# Patient Record
Sex: Female | Born: 1950 | Race: Black or African American | Hispanic: No | Smoking: Never smoker
Health system: Southern US, Community
[De-identification: ages and names within clinical notes are randomized; demographics above are authoritative.]

## PROBLEM LIST (undated history)

## (undated) ENCOUNTER — Emergency Department (HOSPITAL_COMMUNITY): Admission: EM | Payer: PPO | Source: Home / Self Care

## (undated) DIAGNOSIS — I639 Cerebral infarction, unspecified: Secondary | ICD-10-CM

## (undated) DIAGNOSIS — Z860100 Personal history of colon polyps, unspecified: Secondary | ICD-10-CM

## (undated) DIAGNOSIS — K219 Gastro-esophageal reflux disease without esophagitis: Secondary | ICD-10-CM

## (undated) DIAGNOSIS — T4145XA Adverse effect of unspecified anesthetic, initial encounter: Secondary | ICD-10-CM

## (undated) DIAGNOSIS — T7840XA Allergy, unspecified, initial encounter: Secondary | ICD-10-CM

## (undated) DIAGNOSIS — E669 Obesity, unspecified: Secondary | ICD-10-CM

## (undated) DIAGNOSIS — R55 Syncope and collapse: Secondary | ICD-10-CM

## (undated) DIAGNOSIS — F329 Major depressive disorder, single episode, unspecified: Secondary | ICD-10-CM

## (undated) DIAGNOSIS — F32A Depression, unspecified: Secondary | ICD-10-CM

## (undated) DIAGNOSIS — E2609 Other primary hyperaldosteronism: Secondary | ICD-10-CM

## (undated) DIAGNOSIS — I82409 Acute embolism and thrombosis of unspecified deep veins of unspecified lower extremity: Secondary | ICD-10-CM

## (undated) DIAGNOSIS — I10 Essential (primary) hypertension: Secondary | ICD-10-CM

## (undated) DIAGNOSIS — M47812 Spondylosis without myelopathy or radiculopathy, cervical region: Secondary | ICD-10-CM

## (undated) HISTORY — DX: Acute embolism and thrombosis of unspecified deep veins of unspecified lower extremity: I82.409

## (undated) HISTORY — DX: Gastro-esophageal reflux disease without esophagitis: K21.9

## (undated) HISTORY — DX: Obesity, unspecified: E66.9

## (undated) HISTORY — DX: Spondylosis without myelopathy or radiculopathy, cervical region: M47.812

## (undated) HISTORY — DX: Major depressive disorder, single episode, unspecified: F32.9

## (undated) HISTORY — DX: Personal history of colonic polyps: Z86.010

## (undated) HISTORY — DX: Allergy, unspecified, initial encounter: T78.40XA

## (undated) HISTORY — DX: Essential (primary) hypertension: I10

## (undated) HISTORY — DX: Depression, unspecified: F32.A

## (undated) HISTORY — PX: COLONOSCOPY: SHX174

## (undated) HISTORY — DX: Other primary hyperaldosteronism: E26.09

## (undated) HISTORY — DX: Cerebral infarction, unspecified: I63.9

## (undated) HISTORY — DX: Personal history of colon polyps, unspecified: Z86.0100

## (undated) HISTORY — PX: TUBAL LIGATION: SHX77

---

## 1996-12-04 HISTORY — PX: FOOT SURGERY: SHX648

## 2000-02-09 ENCOUNTER — Other Ambulatory Visit: Admission: RE | Admit: 2000-02-09 | Discharge: 2000-02-09 | Payer: Self-pay | Admitting: Obstetrics and Gynecology

## 2000-07-19 ENCOUNTER — Ambulatory Visit (HOSPITAL_COMMUNITY): Admission: RE | Admit: 2000-07-19 | Discharge: 2000-07-19 | Payer: Self-pay | Admitting: Gastroenterology

## 2000-08-11 ENCOUNTER — Encounter: Payer: Self-pay | Admitting: Emergency Medicine

## 2000-08-11 ENCOUNTER — Emergency Department (HOSPITAL_COMMUNITY): Admission: EM | Admit: 2000-08-11 | Discharge: 2000-08-11 | Payer: Self-pay | Admitting: Emergency Medicine

## 2001-07-08 ENCOUNTER — Other Ambulatory Visit: Admission: RE | Admit: 2001-07-08 | Discharge: 2001-07-08 | Payer: Self-pay | Admitting: Obstetrics and Gynecology

## 2001-09-06 ENCOUNTER — Inpatient Hospital Stay (HOSPITAL_COMMUNITY): Admission: EM | Admit: 2001-09-06 | Discharge: 2001-09-08 | Payer: Self-pay

## 2005-05-04 ENCOUNTER — Other Ambulatory Visit: Admission: RE | Admit: 2005-05-04 | Discharge: 2005-05-04 | Payer: Self-pay | Admitting: Obstetrics and Gynecology

## 2005-07-20 ENCOUNTER — Encounter: Admission: RE | Admit: 2005-07-20 | Discharge: 2005-07-20 | Payer: Self-pay | Admitting: Family Medicine

## 2005-10-24 ENCOUNTER — Ambulatory Visit: Payer: Self-pay | Admitting: Oncology

## 2006-03-20 ENCOUNTER — Emergency Department (HOSPITAL_COMMUNITY): Admission: EM | Admit: 2006-03-20 | Discharge: 2006-03-20 | Payer: Self-pay | Admitting: Emergency Medicine

## 2006-04-05 ENCOUNTER — Ambulatory Visit: Payer: Self-pay | Admitting: Internal Medicine

## 2006-04-06 ENCOUNTER — Ambulatory Visit: Payer: Self-pay | Admitting: Cardiology

## 2006-04-24 ENCOUNTER — Ambulatory Visit: Payer: Self-pay | Admitting: Internal Medicine

## 2006-05-08 ENCOUNTER — Ambulatory Visit: Payer: Self-pay | Admitting: Pulmonary Disease

## 2006-05-09 ENCOUNTER — Emergency Department (HOSPITAL_COMMUNITY): Admission: EM | Admit: 2006-05-09 | Discharge: 2006-05-09 | Payer: Self-pay | Admitting: Emergency Medicine

## 2006-05-22 ENCOUNTER — Ambulatory Visit: Payer: Self-pay | Admitting: Internal Medicine

## 2006-05-29 ENCOUNTER — Ambulatory Visit: Payer: Self-pay | Admitting: Internal Medicine

## 2006-06-05 ENCOUNTER — Ambulatory Visit: Payer: Self-pay | Admitting: Internal Medicine

## 2006-06-20 ENCOUNTER — Ambulatory Visit: Payer: Self-pay | Admitting: Internal Medicine

## 2006-06-22 ENCOUNTER — Ambulatory Visit: Payer: Self-pay | Admitting: Internal Medicine

## 2006-07-04 HISTORY — PX: NASAL SINUS SURGERY: SHX719

## 2006-07-04 HISTORY — PX: NASAL POLYP SURGERY: SHX186

## 2006-07-05 ENCOUNTER — Emergency Department (HOSPITAL_COMMUNITY): Admission: EM | Admit: 2006-07-05 | Discharge: 2006-07-05 | Payer: Self-pay | Admitting: Emergency Medicine

## 2006-07-06 ENCOUNTER — Encounter: Payer: Self-pay | Admitting: Vascular Surgery

## 2006-07-06 ENCOUNTER — Ambulatory Visit (HOSPITAL_COMMUNITY): Admission: RE | Admit: 2006-07-06 | Discharge: 2006-07-06 | Payer: Self-pay | Admitting: Family Medicine

## 2006-07-20 ENCOUNTER — Ambulatory Visit (HOSPITAL_COMMUNITY): Admission: RE | Admit: 2006-07-20 | Discharge: 2006-07-21 | Payer: Self-pay | Admitting: Otolaryngology

## 2006-07-20 ENCOUNTER — Encounter (INDEPENDENT_AMBULATORY_CARE_PROVIDER_SITE_OTHER): Payer: Self-pay | Admitting: *Deleted

## 2006-07-31 ENCOUNTER — Ambulatory Visit: Payer: Self-pay | Admitting: Internal Medicine

## 2006-08-14 ENCOUNTER — Ambulatory Visit: Payer: Self-pay | Admitting: Internal Medicine

## 2006-09-07 ENCOUNTER — Ambulatory Visit: Payer: Self-pay | Admitting: Internal Medicine

## 2006-09-17 ENCOUNTER — Ambulatory Visit: Payer: Self-pay | Admitting: Internal Medicine

## 2006-09-27 ENCOUNTER — Ambulatory Visit: Payer: Self-pay | Admitting: Pulmonary Disease

## 2006-10-10 ENCOUNTER — Ambulatory Visit: Payer: Self-pay | Admitting: Internal Medicine

## 2006-11-02 ENCOUNTER — Ambulatory Visit: Payer: Self-pay | Admitting: Internal Medicine

## 2007-04-03 ENCOUNTER — Ambulatory Visit: Payer: Self-pay | Admitting: Pulmonary Disease

## 2007-05-31 ENCOUNTER — Ambulatory Visit: Payer: Self-pay | Admitting: Internal Medicine

## 2007-08-08 ENCOUNTER — Ambulatory Visit: Payer: Self-pay | Admitting: Internal Medicine

## 2007-08-09 ENCOUNTER — Ambulatory Visit: Payer: Self-pay | Admitting: Internal Medicine

## 2007-08-09 LAB — CONVERTED CEMR LAB
BUN: 9 mg/dL (ref 6–23)
Bacteria, UA: NEGATIVE
Basophils Absolute: 0 10*3/uL (ref 0.0–0.1)
Bilirubin Urine: NEGATIVE
CO2: 33 meq/L — ABNORMAL HIGH (ref 19–32)
Chloride: 100 meq/L (ref 96–112)
Creatinine, Ser: 0.6 mg/dL (ref 0.4–1.2)
Crystals: NEGATIVE
GFR calc Af Amer: 134 mL/min
Glucose, Bld: 99 mg/dL (ref 70–99)
HCT: 39.7 % (ref 36.0–46.0)
HDL: 49.7 mg/dL (ref >39.0)
Hemoglobin, Urine: NEGATIVE
Hemoglobin: 13.4 g/dL (ref 12.0–15.0)
Ketones, ur: NEGATIVE mg/dL
Lymphocytes Relative: 33.9 % (ref 12.0–46.0)
MCHC: 33.6 g/dL (ref 30.0–36.0)
Monocytes Absolute: 0.4 10*3/uL (ref 0.2–0.7)
Neutro Abs: 2.7 10*3/uL (ref 1.4–7.7)
Nitrite: NEGATIVE
RBC: 4.48 M/uL (ref 3.87–5.11)
Sodium: 140 meq/L (ref 135–145)
Total CHOL/HDL Ratio: 2.1
Urine Glucose: NEGATIVE mg/dL
VLDL: 11 mg/dL (ref 0–40)
WBC: 5.6 10*3/uL (ref 4.5–10.5)
pH: 8 (ref 5.0–8.0)

## 2007-08-23 ENCOUNTER — Ambulatory Visit: Payer: Self-pay | Admitting: Internal Medicine

## 2007-08-23 LAB — CONVERTED CEMR LAB: Creatinine, Ser: 0.7 mg/dL (ref 0.4–1.2)

## 2007-10-10 ENCOUNTER — Telehealth: Payer: Self-pay | Admitting: Internal Medicine

## 2007-11-07 ENCOUNTER — Ambulatory Visit: Payer: Self-pay | Admitting: Pulmonary Disease

## 2007-12-12 DIAGNOSIS — F329 Major depressive disorder, single episode, unspecified: Secondary | ICD-10-CM

## 2007-12-12 DIAGNOSIS — I10 Essential (primary) hypertension: Secondary | ICD-10-CM | POA: Insufficient documentation

## 2007-12-12 DIAGNOSIS — E669 Obesity, unspecified: Secondary | ICD-10-CM

## 2007-12-12 DIAGNOSIS — K219 Gastro-esophageal reflux disease without esophagitis: Secondary | ICD-10-CM

## 2007-12-12 DIAGNOSIS — D126 Benign neoplasm of colon, unspecified: Secondary | ICD-10-CM

## 2007-12-12 DIAGNOSIS — F32A Depression, unspecified: Secondary | ICD-10-CM | POA: Insufficient documentation

## 2007-12-12 DIAGNOSIS — J328 Other chronic sinusitis: Secondary | ICD-10-CM

## 2007-12-12 DIAGNOSIS — M129 Arthropathy, unspecified: Secondary | ICD-10-CM | POA: Insufficient documentation

## 2008-01-01 ENCOUNTER — Ambulatory Visit: Payer: Self-pay | Admitting: Internal Medicine

## 2008-01-01 DIAGNOSIS — S335XXA Sprain of ligaments of lumbar spine, initial encounter: Secondary | ICD-10-CM

## 2008-01-01 DIAGNOSIS — J069 Acute upper respiratory infection, unspecified: Secondary | ICD-10-CM | POA: Insufficient documentation

## 2008-01-21 ENCOUNTER — Encounter: Payer: Self-pay | Admitting: Internal Medicine

## 2008-01-21 ENCOUNTER — Ambulatory Visit: Payer: Self-pay | Admitting: Internal Medicine

## 2008-01-21 DIAGNOSIS — R05 Cough: Secondary | ICD-10-CM

## 2008-01-30 ENCOUNTER — Telehealth: Payer: Self-pay | Admitting: Internal Medicine

## 2008-02-12 ENCOUNTER — Telehealth: Payer: Self-pay | Admitting: Internal Medicine

## 2008-03-10 ENCOUNTER — Encounter: Payer: Self-pay | Admitting: Internal Medicine

## 2008-03-18 ENCOUNTER — Telehealth: Payer: Self-pay | Admitting: Internal Medicine

## 2008-04-07 ENCOUNTER — Encounter: Payer: Self-pay | Admitting: Internal Medicine

## 2008-04-10 ENCOUNTER — Encounter: Payer: Self-pay | Admitting: Internal Medicine

## 2008-05-06 ENCOUNTER — Ambulatory Visit: Payer: Self-pay | Admitting: Internal Medicine

## 2008-05-07 ENCOUNTER — Telehealth (INDEPENDENT_AMBULATORY_CARE_PROVIDER_SITE_OTHER): Payer: Self-pay | Admitting: *Deleted

## 2008-05-27 ENCOUNTER — Ambulatory Visit: Payer: Self-pay | Admitting: Internal Medicine

## 2008-05-27 DIAGNOSIS — B370 Candidal stomatitis: Secondary | ICD-10-CM

## 2008-05-28 ENCOUNTER — Telehealth: Payer: Self-pay | Admitting: Adult Health

## 2008-06-03 ENCOUNTER — Encounter: Payer: Self-pay | Admitting: Adult Health

## 2008-06-30 ENCOUNTER — Ambulatory Visit: Payer: Self-pay | Admitting: Internal Medicine

## 2008-06-30 DIAGNOSIS — J019 Acute sinusitis, unspecified: Secondary | ICD-10-CM

## 2008-06-30 DIAGNOSIS — M79609 Pain in unspecified limb: Secondary | ICD-10-CM | POA: Insufficient documentation

## 2008-06-30 DIAGNOSIS — Z860100 Personal history of colon polyps, unspecified: Secondary | ICD-10-CM | POA: Insufficient documentation

## 2008-07-07 ENCOUNTER — Encounter: Payer: Self-pay | Admitting: Internal Medicine

## 2008-07-07 ENCOUNTER — Ambulatory Visit: Payer: Self-pay

## 2008-07-30 ENCOUNTER — Ambulatory Visit: Payer: Self-pay | Admitting: Internal Medicine

## 2008-08-15 ENCOUNTER — Telehealth: Payer: Self-pay | Admitting: Internal Medicine

## 2008-08-16 ENCOUNTER — Emergency Department (HOSPITAL_COMMUNITY): Admission: EM | Admit: 2008-08-16 | Discharge: 2008-08-17 | Payer: Self-pay | Admitting: Emergency Medicine

## 2008-08-18 ENCOUNTER — Telehealth (INDEPENDENT_AMBULATORY_CARE_PROVIDER_SITE_OTHER): Payer: Self-pay | Admitting: *Deleted

## 2008-08-19 ENCOUNTER — Ambulatory Visit: Payer: Self-pay | Admitting: Internal Medicine

## 2008-09-02 ENCOUNTER — Ambulatory Visit: Payer: Self-pay | Admitting: Cardiology

## 2008-10-12 ENCOUNTER — Telehealth: Payer: Self-pay | Admitting: Internal Medicine

## 2008-10-19 ENCOUNTER — Ambulatory Visit: Payer: Self-pay | Admitting: Internal Medicine

## 2008-10-21 ENCOUNTER — Telehealth: Payer: Self-pay | Admitting: Internal Medicine

## 2008-10-27 ENCOUNTER — Encounter: Payer: Self-pay | Admitting: Internal Medicine

## 2008-11-23 ENCOUNTER — Ambulatory Visit: Payer: Self-pay | Admitting: Internal Medicine

## 2008-11-23 DIAGNOSIS — J4530 Mild persistent asthma, uncomplicated: Secondary | ICD-10-CM | POA: Insufficient documentation

## 2008-12-17 ENCOUNTER — Ambulatory Visit: Payer: Self-pay | Admitting: Adult Health

## 2009-01-07 ENCOUNTER — Ambulatory Visit: Payer: Self-pay | Admitting: Internal Medicine

## 2009-01-13 ENCOUNTER — Telehealth: Payer: Self-pay | Admitting: Internal Medicine

## 2009-01-13 ENCOUNTER — Telehealth (INDEPENDENT_AMBULATORY_CARE_PROVIDER_SITE_OTHER): Payer: Self-pay | Admitting: *Deleted

## 2009-01-22 ENCOUNTER — Telehealth: Payer: Self-pay | Admitting: Internal Medicine

## 2009-04-02 ENCOUNTER — Ambulatory Visit (HOSPITAL_COMMUNITY): Admission: RE | Admit: 2009-04-02 | Discharge: 2009-04-02 | Payer: Self-pay | Admitting: Otolaryngology

## 2009-04-02 ENCOUNTER — Encounter (INDEPENDENT_AMBULATORY_CARE_PROVIDER_SITE_OTHER): Payer: Self-pay | Admitting: Otolaryngology

## 2009-04-09 ENCOUNTER — Encounter: Payer: Self-pay | Admitting: Internal Medicine

## 2009-04-15 ENCOUNTER — Encounter: Payer: Self-pay | Admitting: Internal Medicine

## 2009-04-16 ENCOUNTER — Ambulatory Visit: Payer: Self-pay | Admitting: Internal Medicine

## 2009-04-16 DIAGNOSIS — E876 Hypokalemia: Secondary | ICD-10-CM | POA: Insufficient documentation

## 2009-04-23 ENCOUNTER — Ambulatory Visit: Payer: Self-pay | Admitting: Internal Medicine

## 2009-04-27 LAB — CONVERTED CEMR LAB
CO2: 32 meq/L (ref 19–32)
Calcium: 8.7 mg/dL (ref 8.4–10.5)
Creatinine, Ser: 0.7 mg/dL (ref 0.4–1.2)
Glucose, Bld: 101 mg/dL — ABNORMAL HIGH (ref 70–99)
Potassium: 3.1 meq/L — ABNORMAL LOW (ref 3.5–5.1)

## 2009-05-11 ENCOUNTER — Telehealth: Payer: Self-pay | Admitting: Internal Medicine

## 2009-05-27 ENCOUNTER — Telehealth: Payer: Self-pay | Admitting: Internal Medicine

## 2009-05-28 ENCOUNTER — Ambulatory Visit: Payer: Self-pay | Admitting: Internal Medicine

## 2009-05-28 DIAGNOSIS — R609 Edema, unspecified: Secondary | ICD-10-CM

## 2009-05-28 DIAGNOSIS — R079 Chest pain, unspecified: Secondary | ICD-10-CM

## 2009-05-29 DIAGNOSIS — J3089 Other allergic rhinitis: Secondary | ICD-10-CM

## 2009-05-29 DIAGNOSIS — J302 Other seasonal allergic rhinitis: Secondary | ICD-10-CM

## 2009-07-07 ENCOUNTER — Ambulatory Visit: Payer: Self-pay | Admitting: Internal Medicine

## 2009-07-07 ENCOUNTER — Telehealth: Payer: Self-pay | Admitting: Internal Medicine

## 2009-07-08 LAB — CONVERTED CEMR LAB
ALT: 15 units/L (ref 0–35)
BUN: 10 mg/dL (ref 6–23)
Calcium: 8.7 mg/dL (ref 8.4–10.5)
Eosinophils Absolute: 0.8 10*3/uL — ABNORMAL HIGH (ref 0.0–0.7)
Eosinophils Relative: 14.2 % — ABNORMAL HIGH (ref 0.0–5.0)
HCT: 35.9 % — ABNORMAL LOW (ref 36.0–46.0)
Ketones, ur: NEGATIVE mg/dL
Lymphocytes Relative: 31.8 % (ref 12.0–46.0)
Lymphs Abs: 1.9 10*3/uL (ref 0.7–4.0)
MCHC: 33.5 g/dL (ref 30.0–36.0)
MCV: 89.3 fL (ref 78.0–100.0)
Neutro Abs: 2.8 10*3/uL (ref 1.4–7.7)
Neutrophils Relative %: 46.7 % (ref 43.0–77.0)
Platelets: 212 10*3/uL (ref 150.0–400.0)
Potassium: 2.8 meq/L — CL (ref 3.5–5.1)
RDW: 12.5 % (ref 11.5–14.6)
Total Bilirubin: 1 mg/dL (ref 0.3–1.2)
Total CHOL/HDL Ratio: 2
Total Protein: 7.7 g/dL (ref 6.0–8.3)
Triglycerides: 58 mg/dL (ref 0.0–149.0)
Urine Glucose: NEGATIVE mg/dL
Urobilinogen, UA: 2 (ref 0.0–1.0)
VLDL: 11.6 mg/dL (ref 0.0–40.0)
WBC: 5.9 10*3/uL (ref 4.5–10.5)

## 2009-07-15 ENCOUNTER — Ambulatory Visit: Payer: Self-pay | Admitting: Internal Medicine

## 2009-07-15 ENCOUNTER — Telehealth: Payer: Self-pay | Admitting: Internal Medicine

## 2009-07-15 LAB — CONVERTED CEMR LAB
BUN: 11 mg/dL (ref 6–23)
CO2: 36 meq/L — ABNORMAL HIGH (ref 19–32)
Calcium: 9.3 mg/dL (ref 8.4–10.5)
Chloride: 101 meq/L (ref 96–112)
Creatinine, Ser: 0.8 mg/dL (ref 0.4–1.2)
Glucose, Bld: 87 mg/dL (ref 70–99)
Sodium: 141 meq/L (ref 135–145)

## 2009-08-11 ENCOUNTER — Ambulatory Visit: Payer: Self-pay | Admitting: Internal Medicine

## 2009-08-11 DIAGNOSIS — M25519 Pain in unspecified shoulder: Secondary | ICD-10-CM

## 2009-08-11 DIAGNOSIS — J9801 Acute bronchospasm: Secondary | ICD-10-CM | POA: Insufficient documentation

## 2009-08-18 ENCOUNTER — Telehealth: Payer: Self-pay | Admitting: Internal Medicine

## 2009-09-14 ENCOUNTER — Emergency Department (HOSPITAL_COMMUNITY): Admission: EM | Admit: 2009-09-14 | Discharge: 2009-09-14 | Payer: Self-pay | Admitting: Emergency Medicine

## 2009-09-16 ENCOUNTER — Ambulatory Visit: Payer: Self-pay | Admitting: Internal Medicine

## 2009-09-16 DIAGNOSIS — R930 Abnormal findings on diagnostic imaging of skull and head, not elsewhere classified: Secondary | ICD-10-CM

## 2009-09-17 ENCOUNTER — Telehealth: Payer: Self-pay | Admitting: Internal Medicine

## 2009-09-17 ENCOUNTER — Telehealth (INDEPENDENT_AMBULATORY_CARE_PROVIDER_SITE_OTHER): Payer: Self-pay | Admitting: *Deleted

## 2009-09-22 ENCOUNTER — Telehealth: Payer: Self-pay | Admitting: Internal Medicine

## 2009-09-22 ENCOUNTER — Ambulatory Visit: Payer: Self-pay | Admitting: Internal Medicine

## 2009-10-12 ENCOUNTER — Telehealth: Payer: Self-pay | Admitting: Internal Medicine

## 2009-10-20 ENCOUNTER — Encounter: Payer: Self-pay | Admitting: Internal Medicine

## 2009-10-22 ENCOUNTER — Encounter: Payer: Self-pay | Admitting: Internal Medicine

## 2009-10-29 ENCOUNTER — Ambulatory Visit: Payer: Self-pay | Admitting: Emergency Medicine

## 2009-11-08 ENCOUNTER — Ambulatory Visit: Payer: Self-pay | Admitting: Internal Medicine

## 2009-11-08 ENCOUNTER — Telehealth (INDEPENDENT_AMBULATORY_CARE_PROVIDER_SITE_OTHER): Payer: Self-pay | Admitting: *Deleted

## 2009-11-16 ENCOUNTER — Ambulatory Visit: Payer: Self-pay | Admitting: Internal Medicine

## 2009-11-16 DIAGNOSIS — R062 Wheezing: Secondary | ICD-10-CM

## 2009-11-30 ENCOUNTER — Encounter (INDEPENDENT_AMBULATORY_CARE_PROVIDER_SITE_OTHER): Payer: Self-pay | Admitting: *Deleted

## 2009-12-02 ENCOUNTER — Ambulatory Visit: Payer: Self-pay | Admitting: Internal Medicine

## 2009-12-02 ENCOUNTER — Encounter: Payer: Self-pay | Admitting: Internal Medicine

## 2009-12-02 ENCOUNTER — Telehealth (INDEPENDENT_AMBULATORY_CARE_PROVIDER_SITE_OTHER): Payer: Self-pay | Admitting: *Deleted

## 2009-12-02 DIAGNOSIS — R1084 Generalized abdominal pain: Secondary | ICD-10-CM | POA: Insufficient documentation

## 2009-12-04 ENCOUNTER — Telehealth: Payer: Self-pay | Admitting: Family Medicine

## 2009-12-09 ENCOUNTER — Ambulatory Visit: Payer: Self-pay | Admitting: Internal Medicine

## 2009-12-17 ENCOUNTER — Ambulatory Visit: Payer: Self-pay | Admitting: Internal Medicine

## 2009-12-17 ENCOUNTER — Encounter: Payer: Self-pay | Admitting: Internal Medicine

## 2010-01-03 ENCOUNTER — Ambulatory Visit: Payer: Self-pay | Admitting: Internal Medicine

## 2010-03-07 ENCOUNTER — Ambulatory Visit: Payer: Self-pay | Admitting: Internal Medicine

## 2010-05-06 ENCOUNTER — Telehealth (INDEPENDENT_AMBULATORY_CARE_PROVIDER_SITE_OTHER): Payer: Self-pay | Admitting: *Deleted

## 2010-05-10 ENCOUNTER — Ambulatory Visit: Payer: Self-pay | Admitting: Internal Medicine

## 2010-05-13 ENCOUNTER — Emergency Department (HOSPITAL_COMMUNITY): Admission: EM | Admit: 2010-05-13 | Discharge: 2010-05-14 | Payer: Self-pay | Admitting: Emergency Medicine

## 2010-05-16 ENCOUNTER — Telehealth (INDEPENDENT_AMBULATORY_CARE_PROVIDER_SITE_OTHER): Payer: Self-pay | Admitting: *Deleted

## 2010-05-17 ENCOUNTER — Ambulatory Visit: Payer: Self-pay | Admitting: Pulmonary Disease

## 2010-05-23 ENCOUNTER — Ambulatory Visit: Payer: Self-pay | Admitting: Emergency Medicine

## 2010-05-23 ENCOUNTER — Telehealth (INDEPENDENT_AMBULATORY_CARE_PROVIDER_SITE_OTHER): Payer: Self-pay | Admitting: *Deleted

## 2010-05-30 ENCOUNTER — Telehealth (INDEPENDENT_AMBULATORY_CARE_PROVIDER_SITE_OTHER): Payer: Self-pay | Admitting: *Deleted

## 2010-05-31 ENCOUNTER — Ambulatory Visit: Payer: Self-pay | Admitting: Internal Medicine

## 2010-06-13 ENCOUNTER — Encounter: Payer: Self-pay | Admitting: Adult Health

## 2010-06-13 ENCOUNTER — Ambulatory Visit: Payer: Self-pay | Admitting: Internal Medicine

## 2010-06-13 DIAGNOSIS — J209 Acute bronchitis, unspecified: Secondary | ICD-10-CM

## 2010-06-15 ENCOUNTER — Telehealth (INDEPENDENT_AMBULATORY_CARE_PROVIDER_SITE_OTHER): Payer: Self-pay | Admitting: *Deleted

## 2010-06-16 ENCOUNTER — Telehealth (INDEPENDENT_AMBULATORY_CARE_PROVIDER_SITE_OTHER): Payer: Self-pay | Admitting: *Deleted

## 2010-06-20 ENCOUNTER — Ambulatory Visit: Payer: Self-pay | Admitting: Internal Medicine

## 2010-06-20 ENCOUNTER — Telehealth (INDEPENDENT_AMBULATORY_CARE_PROVIDER_SITE_OTHER): Payer: Self-pay | Admitting: *Deleted

## 2010-06-20 LAB — CONVERTED CEMR LAB
Basophils Absolute: 0 10*3/uL (ref 0.0–0.1)
Eosinophils Absolute: 0.2 10*3/uL (ref 0.0–0.7)
Eosinophils Relative: 2.3 % (ref 0.0–5.0)
Hemoglobin: 13.1 g/dL (ref 12.0–15.0)
Lymphocytes Relative: 23.6 % (ref 12.0–46.0)
MCHC: 34.1 g/dL (ref 30.0–36.0)
MCV: 87.6 fL (ref 78.0–100.0)
Monocytes Absolute: 0.5 10*3/uL (ref 0.1–1.0)
Monocytes Relative: 5.9 % (ref 3.0–12.0)
Neutrophils Relative %: 67.7 % (ref 43.0–77.0)
RBC: 4.38 M/uL (ref 3.87–5.11)
RDW: 13.7 % (ref 11.5–14.6)

## 2010-06-24 ENCOUNTER — Telehealth (INDEPENDENT_AMBULATORY_CARE_PROVIDER_SITE_OTHER): Payer: Self-pay | Admitting: *Deleted

## 2010-06-28 ENCOUNTER — Ambulatory Visit: Payer: Self-pay | Admitting: Internal Medicine

## 2010-07-19 ENCOUNTER — Ambulatory Visit: Payer: Self-pay | Admitting: Internal Medicine

## 2010-07-25 ENCOUNTER — Telehealth (INDEPENDENT_AMBULATORY_CARE_PROVIDER_SITE_OTHER): Payer: Self-pay | Admitting: *Deleted

## 2010-07-27 ENCOUNTER — Ambulatory Visit: Payer: Self-pay | Admitting: Internal Medicine

## 2010-07-27 DIAGNOSIS — D259 Leiomyoma of uterus, unspecified: Secondary | ICD-10-CM | POA: Insufficient documentation

## 2010-07-27 LAB — CONVERTED CEMR LAB
ALT: 19 units/L (ref 0–35)
Albumin: 3.7 g/dL (ref 3.5–5.2)
Basophils Absolute: 0 10*3/uL (ref 0.0–0.1)
Bilirubin Urine: NEGATIVE
Cholesterol: 104 mg/dL (ref 0–200)
Eosinophils Relative: 3 % (ref 0.0–5.0)
GFR calc non Af Amer: 118.48 mL/min (ref >60)
HCT: 35.9 % — ABNORMAL LOW (ref 36.0–46.0)
HDL: 47.4 mg/dL (ref >39.00)
Hemoglobin: 11.9 g/dL — ABNORMAL LOW (ref 12.0–15.0)
LDL Cholesterol: 36 mg/dL (ref 0–99)
Leukocytes, UA: NEGATIVE
Lymphocytes Relative: 29.3 % (ref 12.0–46.0)
MCV: 89.9 fL (ref 78.0–100.0)
Monocytes Absolute: 0.4 10*3/uL (ref 0.1–1.0)
Monocytes Relative: 6.4 % (ref 3.0–12.0)
Nitrite: NEGATIVE
Platelets: 205 10*3/uL (ref 150.0–400.0)
Potassium: 3.2 meq/L — ABNORMAL LOW (ref 3.5–5.1)
RDW: 14.1 % (ref 11.5–14.6)
TSH: 1.63 microintl units/mL (ref 0.35–5.50)
Total CHOL/HDL Ratio: 2
Total Protein, Urine: NEGATIVE mg/dL
Triglycerides: 102 mg/dL (ref 0.0–149.0)
Urine Glucose: NEGATIVE mg/dL
pH: 7.5 (ref 5.0–8.0)

## 2010-07-28 ENCOUNTER — Ambulatory Visit: Payer: Self-pay | Admitting: Internal Medicine

## 2010-07-28 DIAGNOSIS — J330 Polyp of nasal cavity: Secondary | ICD-10-CM | POA: Insufficient documentation

## 2010-08-10 ENCOUNTER — Ambulatory Visit: Payer: Self-pay | Admitting: Cardiovascular Disease

## 2010-08-10 ENCOUNTER — Ambulatory Visit: Payer: Self-pay | Admitting: Internal Medicine

## 2010-08-18 ENCOUNTER — Emergency Department (HOSPITAL_COMMUNITY): Admission: EM | Admit: 2010-08-18 | Discharge: 2010-08-19 | Payer: Self-pay | Admitting: Emergency Medicine

## 2010-08-25 ENCOUNTER — Ambulatory Visit: Payer: Self-pay | Admitting: Internal Medicine

## 2010-10-06 ENCOUNTER — Telehealth (INDEPENDENT_AMBULATORY_CARE_PROVIDER_SITE_OTHER): Payer: Self-pay | Admitting: *Deleted

## 2010-10-28 ENCOUNTER — Telehealth (INDEPENDENT_AMBULATORY_CARE_PROVIDER_SITE_OTHER): Payer: Self-pay | Admitting: *Deleted

## 2010-10-28 ENCOUNTER — Ambulatory Visit: Payer: Self-pay | Admitting: Internal Medicine

## 2010-11-08 ENCOUNTER — Telehealth (INDEPENDENT_AMBULATORY_CARE_PROVIDER_SITE_OTHER): Payer: Self-pay | Admitting: *Deleted

## 2010-11-11 ENCOUNTER — Ambulatory Visit: Payer: Self-pay | Admitting: Endocrinology

## 2010-11-14 ENCOUNTER — Telehealth: Payer: Self-pay | Admitting: Internal Medicine

## 2010-11-15 ENCOUNTER — Ambulatory Visit: Payer: Self-pay | Admitting: Internal Medicine

## 2010-11-15 DIAGNOSIS — B009 Herpesviral infection, unspecified: Secondary | ICD-10-CM | POA: Insufficient documentation

## 2010-11-15 DIAGNOSIS — N390 Urinary tract infection, site not specified: Secondary | ICD-10-CM

## 2010-11-15 LAB — CONVERTED CEMR LAB
Ketones, ur: NEGATIVE mg/dL
Specific Gravity, Urine: 1.015 (ref 1.000–1.030)
Urine Glucose: NEGATIVE mg/dL
pH: 7.5 (ref 5.0–8.0)

## 2010-12-01 ENCOUNTER — Telehealth: Payer: Self-pay | Admitting: Internal Medicine

## 2010-12-21 ENCOUNTER — Telehealth (INDEPENDENT_AMBULATORY_CARE_PROVIDER_SITE_OTHER): Payer: Self-pay | Admitting: *Deleted

## 2010-12-22 ENCOUNTER — Ambulatory Visit
Admission: RE | Admit: 2010-12-22 | Discharge: 2010-12-22 | Payer: Self-pay | Source: Home / Self Care | Attending: Internal Medicine | Admitting: Internal Medicine

## 2011-01-03 ENCOUNTER — Telehealth: Payer: Self-pay | Admitting: Internal Medicine

## 2011-01-03 NOTE — Assessment & Plan Note (Signed)
Summary: NP follow up -med calendar   Primary Provider/Referring Provider:  Corwin Levins MD  CC:  est med calendar - pt brought all meds to the OV today.  no complaints..  History of Present Illness: 60 yobf  never smoker with previous history of difficult to control cough and wheeze attributed to rhinosinusitis and reflux seen in this office 6/08 with mostly upper airway symptoms recommended maintenance Qvar.    November 23, 2008 ov after ent eval shoemaker can't do rinses.  transiently better after prednisone and afrin x 5 days then flare again with severe cough, minimal green mucus, no sinus pain, no sob except when coughing.  -rx augmentin /predn.   December 17, 2008 --returns for med reivew, better but has daily nasal drainage from clear to green. Symptoms much better after antibiotics. Had FESS 03/2009 much better.  July 07, 2009 ov until 2 weeks with increase dry cough, sob and increase proaire over baseline. no purulent sputum. s.    Acute Visit 10/29/09 -- Presents today for clear nasal drainage, sore throat that started about a week ago. Clear sputum, hoarse voice and more cough. Maintained on QVAR two times a day, uses albuterol rarely, tried it this am. Denies purulent sputum, fever, sinus pain, etc. She has a sick contact last week - child with a URI  November 08, 2009--Presents for an acute office visit. Complains of increased SOB, wheezing, prod cough with green mucus, increased gag reflex, head congestion x1.5weeks. OTC not working, seen 11/26, OTC recommended along w/ sinus hygiene regimen. rec augmentin and mucinex dm  December 09, 2009 Followup.  Pt states that she still has the same cough since Nov 2010.  She states cough is prod with clear or white sputum.  She states that her breathing is the same- no better or worse not able to afford many of her meds and confides not followng calendar because of this.    01/03/10--Presents for follow up and med review. Doing well since  last visit. Bronchitic flare tx w/ steroid burst and GERD prevention. She is unable to afford brand name PPI. She feels much better. We discussed her meds and updated her med calendar today. Denies chest pain, dyspnea, orthopnea, hemoptysis, fever, n/v/d, edema  Medications Prior to Update: 1)  Hydrochlorothiazide 25 Mg Tabs (Hydrochlorothiazide) .... One Daily 2)  Vitamin C 500 Mg Tabs (Ascorbic Acid) .... Take 1 Tablet By Mouth Once A Day 3)  Zinc 50 Mg Tabs (Zinc) .... Take 1 Tablet By Mouth Once A Day 4)  Zantac 150 Mg  Tabs (Ranitidine Hcl) .... One At Bedtime 5)  Biotin Maximum Strength 5000 Mcg Caps (Biotin) .Marland Kitchen.. 1 Every Morning 6)  Klor-Con M10 10 Meq Cr-Tabs (Potassium Chloride Crys Cr) .... 3 By Mouth Once Daily 7)  Fluticasone Propionate 50 Mcg/act  Susp (Fluticasone Propionate) .... 2 Sprays Each Nostril Daily At Bedtime 8)  Singulair 10 Mg  Tabs (Montelukast Sodium) .... Once Daily At Bedtime 9)  Diltiazem Hcl Cr 240 Mg Xr24h-Cap (Diltiazem Hcl) .Marland Kitchen.. 1 By Mouth Once Daily 10)  Nexium 40 Mg  Cpdr (Esomeprazole Magnesium) .... Take  One 30-60 Min Before First Meal of The Day 11)  Qvar 40 Mcg/act Aers (Beclomethasone Dipropionate) .... 2 Puffs Every 12 Hours 12)  Xyzal 5 Mg  Tabs (Levocetirizine Dihydrochloride) .... Take 1 Tab By Mouth At Bedtime As Needed 13)  Mucinex Dm 30-600 Mg  Tb12 (Dextromethorphan-Guaifenesin) .Marland Kitchen.. 1-2 Tablets Every 12 Hours As Needed 14)  Proair Hfa 108 (90 Base) Mcg/act Aers (Albuterol Sulfate) .Marland Kitchen.. 1-2 Puffs Every 4 Hours As Needed 15)  Aleve 220 Mg Tabs (Naproxen Sodium) .... Take 1-2 By Mouth Qd 16)  Echinacea 380 Mg Caps (Echinacea) .... 2 Capsules By Mouth Once Daily 17)  Allergy Relief 4 Mg Tabs (Chlorpheniramine Maleate) .... Take 1 Tablet By Mouth Once A Day 18)  Cephalexin 500 Mg Caps (Cephalexin) .Marland Kitchen.. 1 By Mouth Three Times A Day 19)  Prednisone 10 Mg Tabs (Prednisone) .... Take 4 in Am X 2 Days, Then 3 in Am X 2 Days, Then 2 in Am X 2 Days, Then 1  in Am X 2 Days Then Stop. 20)  Hydrocodone-Homatropine 5-1.5 Mg/66ml Syrp (Hydrocodone-Homatropine) .Marland Kitchen.. 1 Tsp By Mouth Q 6 Hrs As Needed Cough 21)  Prednisone 10 Mg  Tabs (Prednisone) .... 4 Each Am X 2 Days,  3 X 2days, 2x2 Days, and 1x2 Days 22)  Sorbitol 70 % Soln (Sorbitol) .... 2 Tbsp At Bedtime  Allergies (verified): 1)  ! * Ivp Dye  Past History:  Past Medical History: Last updated: 12/09/2009 Hypertension GERD Allergic Rhinitis/sinusitis..........................................................Marland KitchenShoemaker     - FESS 04/02/2009 Obesity Depression Colonic polyps, hx of Health Maintenance.....................................................................John  Past Surgical History: Last updated: 07/18/08 Status post sinus surgery in August 2007 with Dr. Annalee Genta.  Status post foot surgery in 1997/05/02. Tubal ligation  Family History: Last updated: 01/01/2008 Mother is aged 75 - DM II, asthma, and congestive heart failure.  Father deceased at age 30 - history of epilepsy  Social History: Last updated: July 18, 2008 Widowed - husband died 05-02-06 Occupation:  Museum/gallery curator Tobacco use-never Alcohol use-no 3 children - 1 died with homicide  Risk Factors: Smoking Status: never (12/12/2007)  Review of Systems      See HPI  Vital Signs:  Patient profile:   60 year old female Height:      62 inches Weight:      220.50 pounds BMI:     40.48 O2 Sat:      99 % on Room air Temp:     98.1 degrees F oral Pulse rate:   81 / minute BP sitting:   138 / 76  (left arm) Cuff size:   large  Vitals Entered By: Boone Master CNA (January 03, 2010 3:14 PM)  O2 Flow:  Room air CC: est med calendar - pt brought all meds to the OV today.  no complaints. Is Patient Diabetic? No Comments Medications reviewed with patient Daytime contact number verified with patient. Boone Master CNA  January 03, 2010 3:14 PM    Physical Exam  Additional Exam:  general pleasant ambulatory  mod obese black female with a moderately nasal tone to her hoarse voice and a harsh upper airway sounding cough.   wt 221 > 223 December 09, 2009 >>220 01/03/10 HEENT: nl dentition, Nasal mucosa pale. orophanx clear. . Nl external ear canals without cough reflex Neck without JVD/Nodes/TM Lungs  no wheeze RRR no s3 or murmur or increase in P2 Abd not examined Ext warm without calf tenderness, cyanosis clubbing or edema Skin warm and dry without lesions       Impression & Recommendations:  Problem # 1:  COUGH (ICD-786.2)  Upper airway cough syndrome felt multifactoral in nature 2/2 GERD/rhinitis.  REC:  we discussed cough control issues, change dexilant to omeprazole two times a day  Meds reviewed with pt education and computerized med calendar completed/adjusted.     Orders: Est. Patient Level III (  [address])  Medications Added to Medication List This Visit: 1)  Omeprazole 20 Mg Cpdr (Omeprazole) .Marland Kitchen.. 1 by mouth two times a day 2)  Omeprazole 20 Mg Cpdr (Omeprazole) .Marland Kitchen.. 1 by mouth two times a day before meal 3)  Dexilant 60 Mg Cpdr (Dexlansoprazole) .... Take 1 capsule by mouth two times a day before meal  Patient Instructions: 1)  I will send a prescription for Omeprazole in place of Dexilant to pharmacy (this is generic ) 2)  Folllow med calendar closely and bring back to each visit.  3)  follow up Dr. Sherene Sires in 2 months and as needed  4)  Please contact office for sooner follow up if symptoms do not improve or worsen  Prescriptions: OMEPRAZOLE 20 MG CPDR (OMEPRAZOLE) 1 by mouth two times a day  #60 x 5   Entered and Authorized by:   Rubye Oaks NP   Signed by:   Rubye Oaks NP on 01/03/2010   Method used:   Electronically to        [facility]. [phone]* (retail)       [address]       [place], [place]  [address]       Ph: [phone]       Fax: [phone]   RxID:   [id number]    Immunization History:  Pneumovax  Immunization History:    Pneumovax:  n/a (01/03/2010)   Appended Document: med calendar update Medications Added XYZAL 5 MG  TABS (LEVOCETIRIZINE DIHYDROCHLORIDE) Take 1 tab by mouth at bedtime ALLERGY RELIEF 4 MG TABS (CHLORPHENIRAMINE MALEATE) per box PROAIR HFA 108 (90 BASE) MCG/ACT AERS (ALBUTEROL SULFATE) 1-2 puffs every 4-6 hours as needed TRAMADOL HCL 50 MG TABS (TRAMADOL HCL) 1 every 4 hours as needed SORBITOL 70 % SOLN (SORBITOL) 2 tablespoons at bedtime ALEVE 220 MG TABS (NAPROXEN SODIUM) per bottle AFRIN NASAL SPRAY 0.05 % SOLN (OXYMETAZOLINE HCL) 2 puffs two times a day for 5 days          Clinical Lists Changes  Medications: Changed medication from XYZAL 5 MG  TABS (LEVOCETIRIZINE DIHYDROCHLORIDE) Take 1 tab by mouth at bedtime as needed to XYZAL 5 MG  TABS (LEVOCETIRIZINE DIHYDROCHLORIDE) Take 1 tab by mouth at bedtime Removed medication of NEXIUM 40 MG  CPDR (ESOMEPRAZOLE MAGNESIUM) Take  one 30-60 min before first meal of the day Changed medication from ALLERGY RELIEF 4 MG TABS (CHLORPHENIRAMINE MALEATE) Take 1 tablet by mouth once a day to ALLERGY RELIEF 4 MG TABS (CHLORPHENIRAMINE MALEATE) per box Changed medication from PROAIR HFA 108 (90 BASE) MCG/ACT AERS (ALBUTEROL SULFATE) 1-2 puffs every 4 hours as needed to PROAIR HFA 108 (90 BASE) MCG/ACT AERS (ALBUTEROL SULFATE) 1-2 puffs every 4-6 hours as needed Added new medication of TRAMADOL HCL 50 MG TABS (TRAMADOL HCL) 1 every 4 hours as needed Changed medication from SORBITOL 70 % SOLN (SORBITOL) 2 tbsp at bedtime to SORBITOL 70 % SOLN (SORBITOL) 2 tablespoons at bedtime Changed medication from ALEVE 220 MG TABS (NAPROXEN SODIUM) take 1-2 by mouth qd to ALEVE 220 MG TABS (NAPROXEN SODIUM) per bottle Added new medication of AFRIN NASAL SPRAY 0.05 % SOLN (OXYMETAZOLINE HCL) 2 puffs two times a day for 5 days Removed medication of ECHINACEA 380 MG CAPS (ECHINACEA) 2 capsules by mouth once daily Removed medication of  CEPHALEXIN 500 MG CAPS (CEPHALEXIN) 1 by mouth three times a day Removed medication of PREDNISONE 10  MG TABS (PREDNISONE) take 4 in am x 2 days, then 3 in am x 2 days, then 2 in am x 2 days, then 1 in am x 2 days then stop. Removed medication of HYDROCODONE-HOMATROPINE 5-1.5 MG/5ML SYRP (HYDROCODONE-HOMATROPINE) 1 tsp by mouth q 6 hrs as needed cough Removed medication of PREDNISONE 10 MG  TABS (PREDNISONE) 4 each am x 2 days,  3 x 2days, 2x2 days, and 1x2 days

## 2011-01-03 NOTE — Progress Notes (Signed)
Summary: wheezing/ coughing  Phone Note Call from Patient   Caller: Patient Call For: wert Summary of Call: pt c/o wheezing w/ "coughing fits" / green phlegm x 2 wks. denies fever. "some nausea" at times. has not taken her musinex for the last few days. she has, however been taking OTC claritin. pt req [facility] rx [facility] [address]. pt is at work # [phone]. cell # [phone]  Initial call taken by: Tivis Ringer, CNA,  May 06, 2010 4:47 PM  Follow-up for Phone Call        Spoke with pt.  She states that she has been coughing up green sputum x 2 wks "like when I have bronchitis".  She also c/o wheezing x 2 wks. Will forward to TP for recs, thanks! Vernie Murders  May 06, 2010 5:00 PM   Additional Follow-up for Phone Call Additional follow up Details #1::        Per TP, it sounds like she needs eval instead of treating this over the phone.  I advised her to go to the ER for eval asap, or if she would like she could go see PCP at the Sat clinic tommorrow am.  Pt verbalized understanding. Additional Follow-up by: Vernie Murders,  May 06, 2010 5:24 PM

## 2011-01-03 NOTE — Progress Notes (Signed)
Summary: bronchitis  Phone Note Call from Patient Call back at Home Phone [phone]   Caller: Patient Call For: young Summary of Call: Pt c/o coughing, cold, body aches, thick mucous since sunday, wants to be seen today pls advise.//[facility] [address] Initial call taken by: Darletta Moll,  November 08, 2010 11:49 AM  Follow-up for Phone Call        prod cough with clear mucus, some green nasal drainage, body aches, weakness, wheezing, DOE, chills/sweats x2days.   requesting appt today.    allergies: ivp dye.  [facility] [address]  Philipp Deputy [facility]  November 08, 2010 2:13 PM   Additional Follow-up for Phone Call Additional follow up Details #1::        Spoke with pt, sent zpak rx to pharmacy per Dr. Tamera Stands RCP, LPN  November 08, 2010 2:38 PM

## 2011-01-03 NOTE — Progress Notes (Signed)
  Phone Note Call from Patient Call back at [facility] Phone [phone]   Caller: Patient Summary of Call: Pt calls c/o minimal bright blood with wipe earlier today.  Recent constipation issues which have improved over the past week.  One week ago noted some pain with stools but none now.  Abdominal plain films unremarkable few days ago.  denies dizziness, abd pain, fever, or diarrhea.  Had colonoscopy around age 60.  Discussed measures to reduce constipation.  ?anal fissure or hemorrhoid.  pt to f/u with Dr Jonny Ruiz next week if continued blood with stooling. Initial call taken by: Evelena Peat MD,  December 04, 2009 10:04 PM

## 2011-01-03 NOTE — Progress Notes (Signed)
Summary: sinus infection-LMTCB x 1  Phone Note Call from Patient Call back at Work Phone [phone]   Caller: [phone] Call For: YOUNG Reason for Call: Talk to Nurse Summary of Call: Patient has a sinus infection and is requesting antibiotic.  [facility] Initial call taken by: Lehman Prom,  October 06, 2010 1:47 PM  Follow-up for Phone Call        sinus pressure/congestion, green mucus, PND, sneezing, occ f/c/s x2weeks - denies SOB, wheezing.  please advise, thanks!  allergies: ivp dye.  [facility] [address]  last ov 9.22.11. Follow-up by: Boone Master CNA/MA,  October 06, 2010 2:24 PM  Additional Follow-up for Phone Call Additional follow up Details #1::        Per Dr Maple Hudson, call in augmentin 875 mg # 14 two times a day.  Rx was sent to pharm.  LM with coworker for pt TCB Vernie Murders  October 06, 2010 4:19 PM     Additional Follow-up for Phone Call Additional follow up Details #2::    Spoke with pt and notified that rx for augmentin was sent to pharm. Follow-up by: Vernie Murders,  October 06, 2010 4:56 PM  New/Updated Medications: AUGMENTIN 875-125 MG TABS (AMOXICILLIN-POT CLAVULANATE) 1 by mouth two times a day until gone Prescriptions: AUGMENTIN 875-125 MG TABS (AMOXICILLIN-POT CLAVULANATE) 1 by mouth two times a day until gone  #14 x 0   Entered by:   Vernie Murders   Authorized by:   Waymon Budge MD   Signed by:   Vernie Murders on 10/06/2010   Method used:   Electronically to        [facility]. [phone]* (retail)       [address]       [place], [place]  [address]       Ph: [phone]       Fax: [phone]   RxID:   [id number]

## 2011-01-03 NOTE — Progress Notes (Signed)
Summary: sick still  Phone Note Call from Patient Call back at Home Phone [phone]   Caller: Patient Call For: sood Summary of Call: pt still sick goes back work tomorrow still coughing up a lung Initial call taken by: Lacinda Axon,  May 23, 2010 1:01 PM  Follow-up for Phone Call        pt called back.  Dr. Craige Cotta had her out of work until today, but pt is no better.  Coughing until she gags, has been in here,  to ED.  Does he want her to come in? Follow-up by: Eugene Gavia,  May 23, 2010 1:48 PM  Additional Follow-up for Phone Call Additional follow up Details #1::        OV sched with RB at 3:30 pm today Vernie Murders  May 23, 2010 3:05 PM

## 2011-01-03 NOTE — Assessment & Plan Note (Signed)
Summary: Pulmonary/ ext ov with hfa teaching   Primary Provider/Referring Provider:  Corwin Levins MD  CC:  Followup.  Pt states that she still has the same cough since Nov 2010.  She states cough is prod with clear or white sputum.  She states that her breathing is the same- no better or worse.  Marland Kitchen  History of Present Illness: 63 yobf  never smoker with previous history of difficult to control cough and wheeze attributed to rhinosinusitis and reflux seen in this office 6/08 with mostly upper airway symptoms recommended maintenance Qvar.    November 23, 2008 ov after ent eval shoemaker can't do rinses.  transiently better after prednisone and afrin x 5 days then flare again with severe cough, minimal green mucus, no sinus pain, no sob except when coughing.  -rx augmentin /predn.   December 17, 2008 --returns for med reivew, better but has daily nasal drainage from clear to green. Symptoms much better after antibiotics.  Had FESS 03/2009 much better.  July 07, 2009 ov until 2 weeks with increase dry cough, sob and increase proaire over baseline. no purulent sputum. s.    Acute Visit 10/29/09 -- Presents today for clear nasal drainage, sore throat that started about a week ago. Clear sputum, hoarse voice and more cough. Maintained on QVAR two times a day, uses albuterol rarely, tried it this am. Denies purulent sputum, fever, sinus pain, etc. She has a sick contact last week - child with a URI  November 08, 2009--Presents for an acute office visit. Complains of increased SOB, wheezing, prod cough with green mucus, increased gag reflex, head congestion x1.5weeks. OTC not working, seen 11/26, OTC recommended along w/ sinus hygiene regimen. rec augmentin and mucinex dm  December 09, 2009 Followup.  Pt states that she still has the same cough since Nov 2010.  She states cough is prod with clear or white sputum.  She states that her breathing is the same- no better or worse not able to afford many of her  meds and confides not followng calendar because of this. Pt denies any significant sore throat, dysphagia, itching, sneezing,  nasal congestion or excess secretions,  fever, chills, sweats, unintended wt loss, pleuritic or exertional cp, hempoptysis, change in activity tolerance  orthopnea pnd or leg swelling   Current Medications (verified): 1)  Diltiazem Hcl Cr 240 Mg Xr24h-Cap (Diltiazem Hcl) .Marland Kitchen.. 1 By Mouth Once Daily 2)  Zantac 150 Mg  Tabs (Ranitidine Hcl) .... One At Bedtime 3)  Biotin Maximum Strength 5000 Mcg Caps (Biotin) .Marland Kitchen.. 1 Every Morning 4)  Nexium 40 Mg  Cpdr (Esomeprazole Magnesium) .... Take  One 30-60 Min Before First Meal of The Day 5)  Fluticasone Propionate 50 Mcg/act  Susp (Fluticasone Propionate) .... 2 Sprays Each Nostril Daily At Bedtime 6)  Singulair 10 Mg  Tabs (Montelukast Sodium) .... Once Daily At Bedtime 7)  Qvar 40 Mcg/act Aers (Beclomethasone Dipropionate) .... 2 Puffs Every 12 Hours 8)  Xyzal 5 Mg  Tabs (Levocetirizine Dihydrochloride) .... Take 1 Tab By Mouth At Bedtime As Needed 9)  Mucinex Dm 30-600 Mg  Tb12 (Dextromethorphan-Guaifenesin) .Marland Kitchen.. 1-2 Tablets Every 12 Hours As Needed 10)  Proair Hfa 108 (90 Base) Mcg/act Aers (Albuterol Sulfate) .Marland Kitchen.. 1-2 Puffs Every 4 Hours As Needed 11)  Klor-Con M10 10 Meq Cr-Tabs (Potassium Chloride Crys Cr) .... 3 By Mouth Once Daily 12)  Hydrochlorothiazide 25 Mg Tabs (Hydrochlorothiazide) .... One Daily 13)  Aleve 220 Mg Tabs (Naproxen  Sodium) .... Take 1-2 By Mouth Qd 14)  Echinacea 380 Mg Caps (Echinacea) .... 2 Capsules By Mouth Once Daily 15)  Vitamin C 500 Mg Tabs (Ascorbic Acid) .... Take 1 Tablet By Mouth Once A Day 16)  Zinc 50 Mg Tabs (Zinc) .... Take 1 Tablet By Mouth Once A Day 17)  Allergy Relief 4 Mg Tabs (Chlorpheniramine Maleate) .... Take 1 Tablet By Mouth Once A Day 18)  Cephalexin 500 Mg Caps (Cephalexin) .Marland Kitchen.. 1 By Mouth Three Times A Day 19)  Prednisone 10 Mg Tabs (Prednisone) .... Take 4 in Am X 2 Days,  Then 3 in Am X 2 Days, Then 2 in Am X 2 Days, Then 1 in Am X 2 Days Then Stop. 20)  Hydrocodone-Homatropine 5-1.5 Mg/71ml Syrp (Hydrocodone-Homatropine) .Marland Kitchen.. 1 Tsp By Mouth Q 6 Hrs As Needed Cough  Allergies (verified): 1)  ! * Ivp Dye  Past History:  Past Medical History: Hypertension GERD Allergic Rhinitis/sinusitis..........................................................Marland KitchenShoemaker     - FESS 04/02/2009 Obesity Depression Colonic polyps, hx of Health Maintenance.....................................................................John  Vital Signs:  Patient profile:   60 year old female Weight:      223.38 pounds O2 Sat:      94 % on Room air Temp:     97.6 degrees F oral Pulse rate:   97 / minute BP sitting:   136 / 78  (left arm) Cuff size:   large  Vitals Entered By: Vernie Murders (December 09, 2009 2:23 PM)  O2 Flow:  Room air  Physical Exam  Additional Exam:  general pleasant ambulatory mod obese black female with a moderately nasal tone to her hoarse voice and a harsh upper airway sounding cough.   wt 221 > 223 December 09, 2009  HEENT: nl dentition, Nasal mucosa pale. orophanx clear. . Nl external ear canals without cough reflex Neck without JVD/Nodes/TM Lungs  no wheeze, she does have referred upper airway noise.  RRR no s3 or murmur or increase in P2 Abd not examined Ext warm without calf tenderness, cyanosis clubbing or edema Skin warm and dry without lesions       Impression & Recommendations:  Problem # 1:  COUGH (ICD-786.2)  The most common causes of chronic cough in immunocompetent adults include: upper airway cough syndrome (UACS), previously referred to as postnasal drip syndrome,  caused by variety of rhinosinus conditions; (2) asthma; (3) GERD; (4) chronic bronchitis from cigarette smoking or other inhaled environmental irritants; (5) nonasthmatic eosinophilic bronchitis; and (6) bronchiectasis. These conditions, singly or in combination, have  accounted for up to 94% of the causes of chronic cough in prospective studies.   Upper airway cough syndrome, so named because it's frequently impossible to sort out how much is LPR vs CR/sinusitis with freq throat clearing generating secondary extra esophageal GERD from wide swings in gastric pressure that occur with throat clearing, promoting self use of mint and menthol lozenges that reduce the lower esophageal sphincter tone and exacerbate the problem further.  These symptoms are easily confused with asthma/copd by even experienced pulmonogists because they overlap so much. These are the same pts who not infrequently have failed to tolerate ace inhibitors,  dry powder inhalers or biphosphonates or report having reflux symptoms that don't respond to standard doses of PPI  See instructions for specific recommendations   Orders: Est. Patient Level IV (81191)  Problem # 2:  WHEEZING (ICD-786.07)  DDX of  difficult airways managment all start with A and  include Adherence, Ace Inhibitors,  Acid Reflux, Active Sinus Disease, Alpha 1 Antitripsin deficiency, Anxiety masquerading as Airways dz,  ABPA,  allergy(esp in young), Aspiration (esp in elderly), Adverse effects of DPI,  Active smokers, plus one B  = Beta blocker use..    Clearly adherence is greatest issue here  I spent extra time with the patient today explaining optimal mdi  technique.  This improved from  50-75%  Next step is consider repeat sinus ct looking for active sinus dz.  Orders: Est. Patient Level IV (78295)  Medications Added to Medication List This Visit: 1)  Prednisone 10 Mg Tabs (Prednisone) .... 4 each am x 2 days,  3 x 2days, 2x2 days, and 1x2 days 2)  Sorbitol 70 % Soln (Sorbitol) .... 2 tbsp at bedtime  Patient Instructions: 1)  Work on inhaler technique:  relax and blow all the way out then take a nice smooth deep breath back in, triggering the inhaler at same time you start breathing in 2)  GERD (REFLUX)  is a  common cause of respiratory symptoms. It commonly presents without heartburn and can be treated with medication, but also with lifestyle changes including avoidance of late meals, excessive alcohol, smoking cessation, and avoid fatty foods, chocolate, peppermint, colas, red wine, and acidic juices such as orange juice. NO MINT OR MENTHOL PRODUCTS SO NO COUGH DROPS  3)  USE SUGARLESS CANDY INSTEAD (jolley ranchers)  4)  NO OIL BASED VITAMINS  5)  Dexilant 60 mg Take  one 30-60 min before first meal of the day  6)  Prednisone 10 mg 4 each am x 2 days,  3 x 2days, 2x2 days, and 1x2 days  7)  See [name] w/in 2 weeks with all your medications, even over the counter meds, separated in two separate bags, the ones you take no matter what vs the ones you stop once you feel better and take only as needed.  She will generate for you a new user friendly medication calendar that will put [place] all on the same page re: your medication use.  Prescriptions: SORBITOL 70 % SOLN (SORBITOL) 2 tbsp at bedtime  #12 oz x 11   Entered and Authorized by:   Nyoka Cowden MD   Signed by:   Nyoka Cowden MD on 12/09/2009   Method used:   Electronically to        [facility]. [phone]* (retail)       [address]       [place], [place]  [address]       Ph: [phone]       Fax: [phone]   RxID:   [id number] PREDNISONE 10 MG  TABS (PREDNISONE) 4 each am x 2 days,  3 x 2days, 2x2 days, and 1x2 days  #20 x 0   Entered and Authorized by:   Nyoka Cowden MD   Signed by:   Nyoka Cowden MD on 12/09/2009   Method used:   Electronically to        [facility] [phone]* (retail)       [address]       [place], [place]  [address]       Ph: [phone]       Fax: [phone]   RxID:   [id number]

## 2011-01-03 NOTE — Progress Notes (Signed)
Summary: REQUEST TO BE SEEN TODAY- CY PT  Phone Note Call from Patient   Caller: Patient Call For: YOUNG Summary of Call: PT WANTS TO BE SEEN TODAY FOR SINUS CONGESTION/ RUNNY-WATERY EYES. THIS HAS BEEN GOING ON FOR A WHILE AND HAS NOT CLEARED UP. PT IS AT [facility] [phone]. IF WHOEVER ANSWERS SAYS PT IS AT [place], CALL [phone] Initial call taken by: Tivis Ringer, CNA,  October 28, 2010 11:54 AM  Follow-up for Phone Call        called spoke with patient who c/o right eye running, runny nose, sore throat, dry cough and wheezing.  has been using saline nasal spray, netti pot, albuterol hfa and antihistamines.  does not feel much better than she did with the last phone message where she got augmentin for sinus infection.  please advise, thanks!  allergies: ivp dye.  [facility] [address] Follow-up by: Boone Master CNA/MA,  October 28, 2010 12:05 PM  Additional Follow-up for Phone Call Additional follow up Details #1::        Pt coming in today at 130pm to be seen.Reynaldo Minium CMA  October 28, 2010 1:35 PM
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
## 2011-01-03 NOTE — Progress Notes (Signed)
Summary: ? need for ENT eval > ok to defer until seen by Dr Maple Hudson  Phone Note Call from Patient Call back at [facility] Phone [phone]   Caller: Patient Call For: wert Summary of Call: does pt still need to go to ENT since she has appt w/CDY - Also should she ne using her Qvar while she is using Prednisone?  Have you faxed in Office notes from the 18th to [facility]? Initial call taken by: Eugene Gavia,  June 24, 2010 11:59 AM  Follow-up for Phone Call        Spoke with pt.  She states doing much better.  She states that she recalls being told before to not use qvar while taking prednisone.  Do you want her to hold the qvar for now? Also, she states never was able to followup with ENT yet due to cost issues and wants to know if okay to just hold off on this since has allergy consult with DrYoung for August.  Pls advise, thanks! Follow-up by: Vernie Murders,  June 24, 2010 12:17 PM  Additional Follow-up for Phone Call Additional follow up Details #1::        ok to hold off on ent the med calendar [name] just made for her should be followed to the letter and unless a change is called in after a visit, we meant what we said/indicated at the time of the ov as reflected on the calendar, which was to continue all the maintenance meds "no matter what" until the next ov - so yes she should stay on the qvar  and everything else on the maintenance part of the calendar just the way it's written Additional Follow-up by: Nyoka Cowden MD,  June 24, 2010 12:30 PM    Additional Follow-up for Phone Call Additional follow up Details #2::    called spoke with patient.  advised of MW's recs as stated above, stressing to pt the importance of following her med calendar "to the letter."  pt verbalized her understanding. Follow-up by: Boone Master CNA/MA,  June 24, 2010 12:40 PM

## 2011-01-03 NOTE — Letter (Signed)
Summary: Out of Work  [employer]  [address]   [place], [place] [address]   Phone: [phone]  Fax: [phone]    May 23, 2010   Employee:  [employer]    To Whom It May Concern:   For Medical reasons, please excuse the above named employee from work for the following dates:  Start:   05/23/2010  End:   05/26/2010, may go back to work on this day  If you need additional information, please feel free to contact our office.         Sincerely,    Levy Pupa, M.D.

## 2011-01-03 NOTE — Assessment & Plan Note (Signed)
Summary: NP follow up - med calendar/cough   Primary Provider/Referring Provider:  Corwin Levins MD  CC:  est med calendar - pt brought all meds with her today.  still c/o wheezing and prod cough with green mucus to the point of gagging and vomiting and thrush onset yesterday.  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    December 09, 2009 Followup.  Pt states that she still has the same cough since Nov 2010.  She states cough is prod with clear or white sputum.  She states that her breathing is the same- no better or worse not able to afford many of her meds and confides not followng calendar because of this.    01/03/10--Presents for follow up and med review. Doing well since last visit. Bronchitic flare tx w/ steroid burst and GERD prevention. She is unable to afford brand name PPI. New med cal provided, lost it.  Mid May 2011 started with pnds then evolved to refractory cough despite narcotic cough meds, multiple abx and prednisone from multiple providers in this office and "no better".  May 31, 2010 Acute visit.  Pt c/o wheezing and cough x 3 wks.  Cough is occ prod with white to green colored sputum.  She states that she coughs to the point of gagging.  Non compliant with med calendar (produced two old ones she's not following anyway bu not the most recent provided and totally disorganized with meds.    Having to use saba every few hours around the clock for relief of sob.--given steroid taper.   June 13, 2010 --Returns for follow up and med review. Cough is no better. Complains of wheezing, prod cough with green mucus to the point of gagging and vomiting and thrush onset yesterday CXR 05/13/10 showed no acute finding. She got slightly better w/ steroids but cough does not go totally  away. she is still out of work. Denies chest pain,  orthopnea, hemoptysis, fever, n/v/d, edema, headache.    Medications Prior to Update: 1)  Zafirlukast 20 Mg Tabs (Zafirlukast) .... One By Mouth Two Times A Day 2)  Fluticasone Propionate 50 Mcg/act  Susp (Fluticasone Propionate) .... 2 Sprays Each Nostril Daily At Bedtime 3)  Proair Hfa 108 (90 Base) Mcg/act Aers (Albuterol Sulfate) .Marland Kitchen.. 1-2 Puffs Every 4-6 Hours As Needed 4)  Qvar 40 Mcg/act Aers (Beclomethasone Dipropionate) .... 2 Puffs Every 12 Hours 5)  Omeprazole 20 Mg Cpdr (Omeprazole) .Marland Kitchen.. 1 By Mouth Two Times A Day Before Meal 6)  Hydrochlorothiazide 25 Mg Tabs (Hydrochlorothiazide) .... One Daily 7)  Zinc 50 Mg Tabs (Zinc) .... Take 1 Tablet By Mouth Once A Day 8)  Zantac 150 Mg  Tabs (Ranitidine Hcl) .... One At Bedtime 9)  Biotin Maximum Strength 5000 Mcg Caps (Biotin) .Marland Kitchen.. 1 Every Morning 10)  Klor-Con M10 10 Meq Cr-Tabs (Potassium Chloride Crys Cr) .... 3 By Mouth Once Daily 11)  Diltiazem Hcl Cr 240 Mg Xr24h-Cap (Diltiazem Hcl) .Marland Kitchen.. 1 By Mouth Once Daily 12)  Mucinex Dm 30-600 Mg  Tb12 (Dextromethorphan-Guaifenesin) .Marland Kitchen.. 1-2 Tablets Every 12 Hours As Needed 13)  Aleve 220 Mg Tabs (Naproxen Sodium) .... Per Bottle 14)  Afrin Nasal Spray 0.05 % Soln (Oxymetazoline Hcl) .... 2 Puffs Two Times A Day For 5 Days 15)  Tussionex  Pennkinetic Er 8-10 Mg/80ml Lqcr (Chlorpheniramine-Hydrocodone) .... 5cc By Mouth Q12h As Needed For Cough 16)  Echinacea 400 Mg Caps (Echinacea) .Marland Kitchen.. 1 Once Daily As Needed 17)  Loratadine 10 Mg Tabs (Loratadine) .Marland Kitchen.. 1 Once Daily 18)  Hydromet 5-1.5 Mg/2ml Syrp (Hydrocodone-Homatropine) .Marland Kitchen.. 1 Tsp Every 6 Hours As Needed For Cough 19)  Dulcolax 5 Mg Tbec (Bisacodyl) .... As Directed As Needed 20)  Sinus Wash Neti Pot 2300-700 Mg Kit (Sodium Chloride-Sodium Bicarb) .... Once Daily 21)  Saline Nasal Spray 0.65 % Soln (Saline) .Marland Kitchen.. 1 Spray Each Nostril Daily As Needed 22)  Prednisone 10 Mg  Tabs  (Prednisone) .... 4 Each Am X 2days, 2x2days, 1x2days and Stop 23)  Tramadol Hcl 50 Mg  Tabs (Tramadol Hcl) .... One To Two By Mouth Every 4-6 Hours For Cough 24)  Flutter Valve .... Use As Directed As Needed For Cough/gagging  Allergies (verified): 1)  ! * Ivp Dye  Past History:  Past Surgical History: Last updated: 07-05-2008 Status post sinus surgery in August 2007 with Dr. Annalee Genta.  Status post foot surgery in 04-20-97. Tubal ligation  Family History: Last updated: 01/01/2008 Mother is aged 60 - DM II, asthma, and congestive heart failure.  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 Alcohol use-no 3 children - 1 died with homicide  Risk Factors: Smoking Status: never (12/12/2007)  Past Medical History: Hypertension GERD Allergic Rhinitis/sinusitis..........................................................Marland KitchenShoemaker     - FESS 04/02/2009 Obesity Depression Colonic polyps, hx of Health Maintenance.....................................................................John Complex medical regimen       - New calendar provided May 31, 2010 , June 13, 2010  Review of Systems      See HPI  Vital Signs:  Patient profile:   60 year old female Height:      62 inches Weight:      210.25 pounds BMI:     38.59 O2 Sat:      96 % on Room air Temp:     97.2 degrees F oral Pulse rate:   88 / minute BP sitting:   124 / 86  (left arm) Cuff size:   large  Vitals Entered By: Boone Master CNA/MA (June 13, 2010 2:11 PM)  O2 Flow:  Room air CC: est med calendar - pt brought all meds with her today.  still c/o wheezing, prod cough with green mucus to the point of gagging and vomiting and thrush onset yesterday Is Patient Diabetic? No Comments Medications reviewed with patient Daytime contact number verified with patient. Boone Master CNA/MA  June 13, 2010 2:11 PM    Physical Exam  Additional Exam:  ambulatory mod obese black female with a moderately nasal tone to her hoarse voice and a harsh upper airway sounding cough.  wt  223 December 09, 2009 >>220 01/03/10>>217 May 10, 2010 > 213 May 31, 2010 >>210 June 13, 2010  HEENT: nl dentition, Nasal mucosa pale./clear discharge,  orophanx clear. . Nl external ear canals without cough reflexno thrush noted.  Neck without JVD/Nodes/TM Lungs trace end exp wheezing,predominantly upper airway pseudowheeze resolves with purse lip maneuver  RRR no s3 or murmur or increase in P2 Abd not examined Ext warm without calf tenderness, cyanosis clubbing or edema Skin warm and dry without lesions       Impression & Recommendations:  Problem # 1:  ACUTE BRONCHITIS (ICD-466.0)  Slow to resolve bronchitis w/ upper  airway cough syndrome may need to have CT sinus if not improving.  REC:  Meds reviewed with pt education and computerized med calendar completed/adjusted.   Omnicef 300mg  two times a day for 10 days Prednisone taper over next week.  follow med calendar closely and bring to each visit. - follow this for  cough control.  Out of work until seen in 2 weeks.  follow up Dr. Sherene Sires in 2 weeks.  Please contact office for sooner follow up if symptoms do not improve or worsen  Her updated medication list for this problem includes:    Zafirlukast 20 Mg Tabs (Zafirlukast) ..... One by mouth two times a day    Proair Hfa 108 (90 Base) Mcg/act Aers (Albuterol sulfate) .Marland Kitchen... 1-2 puffs every 4-6 hours as needed    Qvar 40 Mcg/act Aers (Beclomethasone dipropionate) .Marland Kitchen... 2 puffs every 12 hours    Mucinex Dm 30-600 Mg Tb12 (Dextromethorphan-guaifenesin) .Marland Kitchen... 1-2 tablets every 12 hours as needed    Tussionex Pennkinetic Er 8-10 Mg/58ml Lqcr (Chlorpheniramine-hydrocodone) .Marland Kitchen... 5cc by mouth q12h as needed for cough    Hydromet 5-1.5 Mg/24ml Syrp (Hydrocodone-homatropine) .Marland Kitchen... 1 tsp every 6 hours as needed for  cough    Cefdinir 300 Mg Caps (Cefdinir) .Marland Kitchen... 1 by mouth two times a day  Orders: Est. Patient Level IV (24401)  Medications Added to Medication List This Visit: 1)  Cefdinir 300 Mg Caps (Cefdinir) .Marland Kitchen.. 1 by mouth two times a day 2)  Prednisone 10 Mg Tabs (Prednisone) .... 4 tabs for 2 days, then 3 tabs for 2 days, 2 tabs for 2 days, then 1 tab for 2 days, then stop  Complete Medication List: 1)  Zafirlukast 20 Mg Tabs (Zafirlukast) .... One by mouth two times a day 2)  Fluticasone Propionate 50 Mcg/act Susp (Fluticasone propionate) .... 2 sprays each nostril daily at bedtime 3)  Proair Hfa 108 (90 Base) Mcg/act Aers (Albuterol sulfate) .Marland Kitchen.. 1-2 puffs every 4-6 hours as needed 4)  Qvar 40 Mcg/act Aers (Beclomethasone dipropionate) .... 2 puffs every 12 hours 5)  Omeprazole 20 Mg Cpdr (Omeprazole) .Marland Kitchen.. 1 by mouth two times a day before meal 6)  Hydrochlorothiazide 25 Mg Tabs (Hydrochlorothiazide) .... One daily 7)  Zinc 50 Mg Tabs (Zinc) .... Take 1 tablet by mouth once a day 8)  Zantac 150 Mg Tabs (Ranitidine hcl) .... One at bedtime 9)  Biotin Maximum Strength 5000 Mcg Caps (Biotin) .Marland Kitchen.. 1 every morning 10)  Klor-con M10 10 Meq Cr-tabs (Potassium chloride crys cr) .... 3 by mouth once daily 11)  Diltiazem Hcl Cr 240 Mg Xr24h-cap (Diltiazem hcl) .Marland Kitchen.. 1 by mouth once daily 12)  Mucinex Dm 30-600 Mg Tb12 (Dextromethorphan-guaifenesin) .Marland Kitchen.. 1-2 tablets every 12 hours as needed 13)  Aleve 220 Mg Tabs (Naproxen sodium) .... Per bottle 14)  Afrin Nasal Spray 0.05 % Soln (Oxymetazoline hcl) .... 2 puffs two times a day for 5 days 15)  Tussionex Pennkinetic Er 8-10 Mg/43ml Lqcr (Chlorpheniramine-hydrocodone) .... 5cc by mouth q12h as needed for cough 16)  Echinacea 400 Mg Caps (Echinacea) .Marland Kitchen.. 1 once daily as needed 17)  Loratadine 10 Mg Tabs (Loratadine) .Marland Kitchen.. 1 once daily 18)  Hydromet 5-1.5 Mg/58ml Syrp (Hydrocodone-homatropine) .Marland Kitchen.. 1 tsp every 6 hours as needed for cough 19)  Dulcolax  5 Mg Tbec (Bisacodyl) .... As directed as needed 20)  Sinus Wash Neti Pot 2300-700 Mg Kit (Sodium chloride-sodium bicarb) .... Once daily 21)  Saline Nasal Spray 0.65 % Soln (Saline) .Marland Kitchen.. 1 spray  each nostril daily as needed 22)  Prednisone 10 Mg Tabs (Prednisone) .... 4 each am x 2days, 2x2days, 1x2days and stop 23)  Tramadol Hcl 50 Mg Tabs (Tramadol hcl) .... One to two by mouth every 4-6 hours for cough 24)  Flutter Valve  .... Use as directed as needed for cough/gagging 25)  Cefdinir 300 Mg Caps (Cefdinir) .Marland Kitchen.. 1 by mouth two times a day 26)  Prednisone 10 Mg Tabs (Prednisone) .... 4 tabs for 2 days, then 3 tabs for 2 days, 2 tabs for 2 days, then 1 tab for 2 days, then stop  Patient Instructions: 1)  Omnicef 300mg  two times a day for 10 days 2)  Prednisone taper over next week.  3)  follow med calendar closely and bring to each visit. - follow this for  cough control.  4)  Out of work until seen in 2 weeks.  5)  follow up Dr. Sherene Sires in 2 weeks.  6)  Please contact office for sooner follow up if symptoms do not improve or worsen  Prescriptions: PREDNISONE 10 MG TABS (PREDNISONE) 4 tabs for 2 days, then 3 tabs for 2 days, 2 tabs for 2 days, then 1 tab for 2 days, then stop  #20 x 0   Entered and Authorized by:   Rubye Oaks NP   Signed by:   Rubye Oaks NP on 06/13/2010   Method used:   Electronically to        [facility]. [phone]* (retail)       [address]       [place], [place]  [address]       Ph: [phone]       Fax: [phone]   RxID:   [id number] CEFDINIR 300 MG CAPS (CEFDINIR) 1 by mouth two times a day  #20 x 0   Entered and Authorized by:   Rubye Oaks NP   Signed by:   Rubye Oaks NP on 06/13/2010   Method used:   Electronically to        [facility]. [phone]* (retail)       [address]       [place], [place]  [address]       Ph: [phone]       Fax: [phone]   RxID:    [id number]   Appended Document: med calendar update Medications Added VITAMIN C 500 MG TABS (ASCORBIC ACID) Take 1 tablet by mouth once a day BIOTIN 1000 MCG TABS (BIOTIN) 3 tabs by mouth once daily FLUTICASONE PROPIONATE 50 MCG/ACT  SUSP (FLUTICASONE PROPIONATE) 2 sprays each nostril two times a day LORATADINE 10 MG TABS (LORATADINE) 1 once daily as needed TRAMADOL HCL 50 MG  TABS (TRAMADOL HCL) 1 every 4 hours as needed SORBITOL 70 % SOLN (SORBITOL) 2 tbsp at bedtime as needed SALINE NASAL SPRAY 0.65 % SOLN (SALINE) 2 sprays each nostril as needed          Clinical Lists Changes  Medications: Added new medication of VITAMIN C 500 MG TABS (ASCORBIC ACID) Take 1 tablet by mouth once a day Changed medication from BIOTIN MAXIMUM STRENGTH 5000 MCG CAPS (BIOTIN) 1 every morning to BIOTIN 1000 MCG TABS (BIOTIN) 3 tabs by mouth once daily Changed medication from FLUTICASONE PROPIONATE 50 MCG/ACT  SUSP (FLUTICASONE PROPIONATE) 2 sprays each nostril daily at bedtime to FLUTICASONE PROPIONATE 50 MCG/ACT  SUSP (  FLUTICASONE PROPIONATE) 2 sprays each nostril two times a day Changed medication from LORATADINE 10 MG TABS (LORATADINE) 1 once daily to LORATADINE 10 MG TABS (LORATADINE) 1 once daily as needed Changed medication from TRAMADOL HCL 50 MG  TABS (TRAMADOL HCL) One to two by mouth every 4-6 hours for cough to TRAMADOL HCL 50 MG  TABS (TRAMADOL HCL) 1 every 4 hours as needed Added new medication of SORBITOL 70 % SOLN (SORBITOL) 2 tbsp at bedtime as needed Changed medication from SALINE NASAL SPRAY 0.65 % SOLN (SALINE) 1 spray each nostril daily as needed to SALINE NASAL SPRAY 0.65 % SOLN (SALINE) 2 sprays each nostril as needed Removed medication of TUSSIONEX PENNKINETIC ER 8-10 MG/5ML LQCR (CHLORPHENIRAMINE-HYDROCODONE) 5cc by mouth q12h as needed for cough Removed medication of ECHINACEA 400 MG CAPS (ECHINACEA) 1 once daily as needed Removed medication of HYDROMET 5-1.5 MG/5ML SYRP  (HYDROCODONE-HOMATROPINE) 1 tsp every 6 hours as needed for cough Removed medication of DULCOLAX 5 MG TBEC (BISACODYL) as directed as needed Removed medication of SINUS WASH NETI POT 2300-700 MG KIT (SODIUM CHLORIDE-SODIUM BICARB) once daily Removed medication of PREDNISONE 10 MG  TABS (PREDNISONE) 4 each am x 2days, 2x2days, 1x2days and stop

## 2011-01-03 NOTE — Assessment & Plan Note (Signed)
Summary: allergies issues/cb   Primary Provider/Referring Provider:  Corwin Levins MD  CC:  Accute visit-asthma attack last week; hoarseness, nasal drainage, headache, and chest congestion-went to [facility] ER Thursday last week.Marland Kitchen  History of Present Illness: August 10, 2010- Allergic rhinosinusitis, nasal polyps, Asthma, Off antihistamines for skin testing. Eyes watering more than usual in rain yesterday. Feels hoarse with some residual sore throat. Left frontotemporal headache. Not blowing from nose. Yesterday sneezed a lot. Ears ok. No recent use of proair, but says she should have used it yesterday- that passed. Going for f/u Chest CT when she leaves here. Skin test- Positive grass, weed, dust, some trees  August 25, 2010- Allergic rhinosinustis, nasal polyps, asthma, abnl CT (Dr Sherene Sires) Acute vist. One week ago walked 1-2 blocks from work to lunch. By the time she returned she was short of breath, tight nose and chest. Her rescue inhaler didn't work well. Went to ER [facility]. They gave prednisone to take for 5 days- off x 2 days. It did help some.  Now has left frontal pressure, postnasal drip, hoarse, scratchy throat, chest now ok ("at times feel short "). Ears ok.  I find CT report with stable tubular RML opacity, ? mucus. I have not been following her for that- sees Dr Sherene Sires.    Asthma History    Asthma Control Assessment:    Age range: 12+ years    Symptoms: >2 days/week    Nighttime Awakenings: 0-2/month    Interferes w/ normal activity: no limitations    SABA use (not for EIB): >2 days/week    ATAQ questionnaire: 0    Asthma Control Assessment: Not Well Controlled   Preventive Screening-Counseling & Management  Alcohol-Tobacco     Smoking Status: never  Current Medications (verified): 1)  Omeprazole 20 Mg Cpdr (Omeprazole) .Marland Kitchen.. 1 By Mouth Two Times A Day Before Meal 2)  Hydrochlorothiazide 25 Mg Tabs (Hydrochlorothiazide) .... One Daily 3)  Vitamin C 500 Mg Tabs  (Ascorbic Acid) .... Take 1 Tablet By Mouth Once A Day 4)  Zinc 50 Mg Tabs (Zinc) .... Take 1 Tablet By Mouth Once A Day 5)  Zantac 150 Mg  Tabs (Ranitidine Hcl) .... One At Bedtime 6)  Biotin 1000 Mcg Tabs (Biotin) .... 3 Tabs By Mouth Once Daily 7)  Klor-Con M10 10 Meq Cr-Tabs (Potassium Chloride Crys Cr) .... 4 By Mouth Once Daily 8)  Fluticasone Propionate 50 Mcg/act  Susp (Fluticasone Propionate) .... 2 Sprays Each Nostril Two Times A Day 9)  Zafirlukast 20 Mg Tabs (Zafirlukast) .... One By Mouth Two Times A Day 10)  Qvar 40 Mcg/act Aers (Beclomethasone Dipropionate) .... 2 Puffs Every 12 Hours 11)  Diltiazem Hcl Cr 240 Mg Xr24h-Cap (Diltiazem Hcl) .Marland Kitchen.. 1 By Mouth Once Daily 12)  Loratadine 10 Mg Tabs (Loratadine) .Marland Kitchen.. 1 Once Daily As Needed 13)  Mucinex Dm 30-600 Mg  Tb12 (Dextromethorphan-Guaifenesin) .Marland Kitchen.. 1-2 Tablets Every 12 Hours As Needed 14)  Proair Hfa 108 (90 Base) Mcg/act Aers (Albuterol Sulfate) .Marland Kitchen.. 1-2 Puffs Every 4-6 Hours As Needed 15)  Tramadol Hcl 50 Mg  Tabs (Tramadol Hcl) .Marland Kitchen.. 1 Every 4 Hours As Needed 16)  Sorbitol 70 % Soln (Sorbitol) .... 2 Tbsp At Bedtime As Needed 17)  Afrin Nasal Spray 0.05 % Soln (Oxymetazoline Hcl) .... 2 Puffs Two Times A Day For 5 Days 18)  Saline Nasal Spray 0.65 % Soln (Saline) .... 2 Sprays Each Nostril As Needed 19)  Flutter Valve .... Use As Directed  As Needed For Cough/gagging  Allergies (verified): 1)  ! * Ivp Dye  Past History:  Past Medical History: Last updated: 08/10/2010 Hypertension GERD Allergic Rhinitis/sinusitis..........................................................Marland KitchenShoemaker (nasal polypectomy x 2)     - FESS 04/02/2009      - Allergy profile June 20, 2010 >> no eos,  IgE 1697 > refer to Dr Maple Hudson 2010/08/02      - Skin Test- Pos grass, weeds, dust, some trees Asthma      - HFA 25% June 20, 2010 >   75% June 28, 2010  Obesity Depression Colonic polyps, hx of Health  Maintenance.....................................................................John Complex medical regimen       - New calendar provided May 31, 2010 , June 13, 2010  Past Surgical History: Last updated: August 02, 2010 Status post sinus surgery in August 2007 with Dr. Annalee Genta. Nasal polypectomy x 2 Status post foot surgery in Apr 20, 1997. Tubal ligation  Family History: Last updated: 2010/08/02 Mother is aged 80 - DM II, asthma, allergies, and congestive heart failure.  Father deceased at age 57 - history of epilepsy  Social History: Last updated: Aug 02, 2010 Widowed - husband died 2006/04/20 Occupation:  Museum/gallery curator at [employer] Tobacco use-never Alcohol use-no 3 children - 1 died with homicide She lives with another son and his wife- he has had second double lung transpant after complications of MVA.  Risk Factors: Smoking Status: never (08/25/2010)  Review of Systems      See HPI       The patient complains of non-productive cough, nasal congestion/difficulty breathing through nose, and sneezing.  The patient denies shortness of breath with activity, shortness of breath at rest, productive cough, coughing up blood, chest pain, irregular heartbeats, acid heartburn, indigestion, loss of appetite, weight change, abdominal pain, difficulty swallowing, sore throat, tooth/dental problems, and headaches.    Vital Signs:  Patient profile:   60 year old female Height:      62 inches Weight:      202 pounds BMI:     37.08 O2 Sat:      100 % on Room air Pulse rate:   78 / minute BP sitting:   118 / 78  (left arm) Cuff size:   regular  Vitals Entered By: Reynaldo Minium CMA (August 25, 2010 3:44 PM)  O2 Flow:  Room air CC: Accute visit-asthma attack last week; hoarseness,nasal drainage,headache,chest congestion-went to [facility] ER Thursday last week.   Physical Exam  Additional Exam:  General: A/Ox3; pleasant and cooperative, NAD, SKIN: no rash, lesions NODES: no lymphadenopathy HEENT:  /AT, EOM- WNL, Conjuctivae- clear, PERRLA, TM-WNL, Nose- audibly stuffy but not visibly obstructed, no polyps seen, Throat- clear and wnl, Mallampati  II, no cobblestone or drainage, Voice is hoarse but throat is not red and there is no stridor NECK: Supple w/ fair ROM, JVD- none, normal carotid impulses w/o bruits Thyroid- normal to palpation,  CHEST: Clear to P&A, no wheeze or cough HEART: RRR, no m/g/r heard ABDOMEN: overweight ZOX:WRUE, nl pulses, no edema  NEURO: Grossly intact to observation      Impression & Recommendations:  Problem # 1:  RHINOSINUSITIS, CHRONIC (ICD-473.8) Acute on chronic sinustis by description, with acute exacerbation. This may have begun with a sustained allergen exposure as described walking outdoors last week. We will give  a neb treatmen and antibiotic and encourage continued use of her neti pot. Consider possible recurring nasal polyps.  Problem # 2:  INTRINSIC ASTHMA, UNSPECIFIED (ICD-493.10) What we are seeing today isnt asthma- chest is clear and i  don't think it is VCD. If we clear her sinuses I think she will do better.Watch for other problems. Doubt PE or CHF, noting O2 sat 100% at rest here. Question relation to the ? mucus plugging on her recent chst CT report.   Medications Added to Medication List This Visit: 1)  Amoxicillin-pot Clavulanate 875-125 Mg Tabs (Amoxicillin-pot clavulanate) .Marland Kitchen.. 1 twice dialy  Other Orders: Est. Patient Level IV (27253) Nebulizer Tx (66440) Admin 1st Vaccine (34742) Flu Vaccine 62yrs + (59563)  Patient Instructions: 1)  Keep appointment 2)  Neb neo 3)  Flu shot 4)  Antibiotic script sent to drug store 5)  Try Sudafed as a decongestant- sign for it  6)  Continue using Neti pot at least once daily  Prescriptions: AMOXICILLIN-POT CLAVULANATE 875-125 MG TABS (AMOXICILLIN-POT CLAVULANATE) 1 twice dialy  #20 x 0   Entered and Authorized by:   Waymon Budge MD   Signed by:   Waymon Budge MD on  08/25/2010   Method used:   Electronically to        [facility]. [phone]* (retail)       [address]       [place], [place]  [address]       Ph: [phone]       Fax: [phone]   RxID:   [id number]       Medication Administration  Medication # 1:    Medication: EMR miscellaneous medications    Diagnosis: RHINOSINUSITIS, CHRONIC (ICD-473.8)    Dose: 3 drops     Route: intranasal    Exp Date: 12/2011    Lot #: [id number]    Mfr: bayer    Comments: Neo-synephrine    Patient tolerated medication without complications    Given by: Reynaldo Minium CMA (August 25, 2010 5:05 PM)  Orders Added: 1)  Est. Patient Level IV [99214] 2)  Nebulizer Tx [94640] 3)  Admin 1st Vaccine [90471] 4)  Flu Vaccine 76yrs + [17616] Flu Vaccine Consent Questions     Do you have a history of severe allergic reactions to this vaccine? no    Any prior history of allergic reactions to egg and/or gelatin? no    Do you have a sensitivity to the preservative Thimersol? no    Do you have a past history of Guillan-Barre Syndrome? no    Do you currently have an acute febrile illness? no    Have you ever had a severe reaction to latex? no    Vaccine information given and explained to patient? yes    Are you currently pregnant? no    Lot Number:[id number]   Exp Date:06/03/2011   Site Given  Left Deltoid IMt. Patient Level IV [07371] 2)  Nebulizer Tx [94640] 3)  Admin 1st Vaccine [90471] 4)  Flu Vaccine 8yrs + [06269]      .lbflu Reynaldo Minium CMA  August 25, 2010 5:06 PM

## 2011-01-03 NOTE — Assessment & Plan Note (Signed)
Summary: Andrea Mason patient, worse since last OV///JJ   Primary Provider/Referring Provider:  Corwin Levins MD  CC:  Acute visit.  Dr. Sherene Sires patient. The patient c/o prod cough with white to clear mucus along with sob and wheezing. She was started on Augmentin on  05/10/2010.  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     December 09, 2009 Followup.  Pt states that she still has the same cough since Nov 2010.  She states cough is prod with clear or white sputum.  She states that her breathing is the same- no better or worse not able to afford many of her meds and confides not followng calendar because of this.    01/03/10--Presents for follow up and med review. Doing well since last visit. Bronchitic flare tx w/ steroid burst and GERD prevention. She is unable to afford brand name PPI. She feels much better. We discussed her meds and updated her med calendar today. Denies chest pain, dyspnea, orthopnea, hemoptysis, fever, n/v/d, edema  May 10, 2010--Presents for an acute office visit. Complains of wheezing, hoarseness, dry cough, sinus pressure/congestion with green drainage, PND x2weeks. OTC not helping. Denies chest pain, dyspnea, orthopnea, hemoptysis, fever, n/v/d, edema, headache.   May 17, 2010 -- She has improved some since starting augmentin.  She was in the ER on 06/10 for cough.  She was given hydrocodone.  She still has cough, but sputum is now clear.  She has wheeze from her throat.  No fever or chest pain.  Had nausea, but now better.  She was using albuterol alot, and this helped.  She can't afford singulair.  CXR  Procedure date:  05/13/2010  Findings:       CHEST - 2 VIEW    Comparison: CT chest 12/17/2009.  Chest x-ray 03/30/2009.    Findings: Borderline cardiac  enlargement. with mildly tortuous   aorta.  Clear lung fields without infiltrate or failure.  Skeletal   osteopenia. Mild thoracic degenerative change without compression   fracture.   Improved aeration compared with prior chest.    IMPRESSION:   Cardiomegaly, no active disease.  CT of Chest  Procedure date:  12/17/2009  Findings:       CT CHEST WITHOUT CONTRAST    Technique:  Multidetector CT imaging of the chest was performed   following the standard protocol without IV contrast.    Comparison: Chest, abdomen, pelvic CT of 09/14/2009.    Findings: Lung windows demonstrate similar appearance of probable   mucus filled bronchial in the right middle lobe including on image   31.  Minimal surrounding nodularity.   Clear left lung.    Soft tissue windows demonstrate normal heart size.  Minimal   anterior pericardial fluid thickening on image 36.  Likely   physiologic.  No pleural fluid.  The low pretracheal/precarinal   lymph node measures 1.6 x 0.9 cm and is similar to slightly   decreased from 2.0 x 1.1 cm on the prior exam. No mediastinal or   definite hilar adenopathy, given limitations of unenhanced CT.    Limited abdominal imaging demonstrates no significant findings.   Tiny left cervical rib.    IMPRESSION:    1.  Similar appearance of mucous plugging and minimal nodularity in   the right middle lobe.  Most likely post infectious or   inflammatory.  No evidence of underlying obstructive endobronchial   lesion.  Presuming the patient is asymptomatic, this likely does   not warrant ongoing imaging surveillance.  If the patient has   ongoing pulmonary complaints, a follow-up chest CT in 6 months   should be considered.   2.  Similar to slight decrease in size of a borderline enlarged   mediastinal lymph node.  Likely reactive.   Current Medications (verified): 1)  Omeprazole 20 Mg Cpdr (Omeprazole) .Marland Kitchen.. 1 By Mouth Two Times A Day Before Meal 2)   Hydrochlorothiazide 25 Mg Tabs (Hydrochlorothiazide) .... One Daily 3)  Vitamin C 500 Mg Tabs (Ascorbic Acid) .... Take 1 Tablet By Mouth Once A Day 4)  Zinc 50 Mg Tabs (Zinc) .... Take 1 Tablet By Mouth Once A Day 5)  Zantac 150 Mg  Tabs (Ranitidine Hcl) .... One At Bedtime 6)  Biotin Maximum Strength 5000 Mcg Caps (Biotin) .Marland Kitchen.. 1 Every Morning 7)  Klor-Con M10 10 Meq Cr-Tabs (Potassium Chloride Crys Cr) .... 3 By Mouth Once Daily 8)  Fluticasone Propionate 50 Mcg/act  Susp (Fluticasone Propionate) .... 2 Sprays Each Nostril Daily At Bedtime 9)  Singulair 10 Mg  Tabs (Montelukast Sodium) .... Once Daily At Bedtime 10)  Qvar 40 Mcg/act Aers (Beclomethasone Dipropionate) .... 2 Puffs Every 12 Hours 11)  Xyzal 5 Mg  Tabs (Levocetirizine Dihydrochloride) .... Take 1 Tab By Mouth At Bedtime 12)  Diltiazem Hcl Cr 240 Mg Xr24h-Cap (Diltiazem Hcl) .Marland Kitchen.. 1 By Mouth Once Daily 13)  Allergy Relief 4 Mg Tabs (Chlorpheniramine Maleate) .... Per Box 14)  Mucinex Dm 30-600 Mg  Tb12 (Dextromethorphan-Guaifenesin) .Marland Kitchen.. 1-2 Tablets Every 12 Hours As Needed 15)  Proair Hfa 108 (90 Base) Mcg/act Aers (Albuterol Sulfate) .Marland Kitchen.. 1-2 Puffs Every 4-6 Hours As Needed 16)  Tramadol Hcl 50 Mg Tabs (Tramadol Hcl) .Marland Kitchen.. 1 Every 4 Hours As Needed 17)  Sorbitol 70 % Soln (Sorbitol) .... 2 Tablespoons At Bedtime 18)  Aleve 220 Mg Tabs (Naproxen Sodium) .... Per Bottle 19)  Afrin Nasal Spray 0.05 % Soln (Oxymetazoline Hcl) .... 2 Puffs Two Times A Day For 5 Days 20)  Augmentin 875-125 Mg Tabs (Amoxicillin-Pot Clavulanate) .Marland Kitchen.. 1 By Mouth Two Times A Day  Allergies (verified): 1)  ! * Ivp Dye  Past History:  Past Medical History: Reviewed history from 12/09/2009 and no changes required. Hypertension GERD Allergic Rhinitis/sinusitis..........................................................Marland KitchenShoemaker     - FESS 04/02/2009 Obesity Depression Colonic polyps, hx of Health  Maintenance.....................................................................John  Past Surgical History: Reviewed history from 06/30/2008 and no changes required. Status post sinus surgery in August 2007 with Dr. Annalee Genta.  Status post foot surgery in 1998. Tubal ligation  Vital Signs:  Patient profile:   60 year old female Height:      62 inches (157.48 cm) Weight:      215 pounds (97.73 kg) BMI:     39.47 O2 Sat:      96 % on Room air Temp:     98.1 degrees F (36.72 degrees C) oral Pulse rate:   84 / minute BP sitting:   120 / 80  (left arm) Cuff size:   large  Vitals Entered By: Michel Bickers CMA (May 17, 2010 2:24 PM)  O2 Sat at Rest %:  96 O2 Flow:  Room air CC: Acute visit.  Dr. Sherene Sires patient. The patient c/o prod cough with white to clear mucus along with sob and wheezing. She was started  on Augmentin on  05/10/2010 Comments Medications reviewed. Daytime phone verified. Michel Bickers CMA  May 17, 2010 2:25 PM   Physical Exam  Ears:  TMs intact and clear with normal canals Nose:  clear nasal discharge, no sinus tenderness Mouth:  mild erythema, no exudate Neck:  wheeze over throat Chest Wall:  no deformities noted Lungs:  faint wheeze radiating from throat Heart:  regular rhythm, normal rate, and no murmurs.   Abdomen:  bowel sounds positive; abdomen soft and non-tender without masses, or organomegaly Extremities:  no clubbing, cyanosis, edema, or deformity noted Cervical Nodes:  no significant adenopathy   Impression & Recommendations:  Problem # 1:  SINUSITIS- ACUTE-NOS (ICD-461.9)  She has slow to resolve sinus infection.  She is to complete augmentin.  Advised her to use nasal irrigation and fluticasone on a regular basis.  I have also given her a sample of nasonex.  She can not afford singulair.  I will therefore change her to zafirlukast 20 mg two times a day.  She is to continue as needed claritin, Qvar, and proair.  Problem # 2:  WHEEZING  (ICD-786.07)  Mostly pseudowheeze from her throat.  Will need to optimize her sinus regimen.  Problem # 3:  COMPUTERIZED TOMOGRAPHY, CHEST, ABNORMAL (ICD-793.1)  She had abnormal CT chest as stated above.  Will f/u with Dr. Sherene Sires.  Orders: Est. Patient Level IV (11914) Prescription Created Electronically [id number])  Problem # 4:  COUGH (ICD-786.2)  From post-nasal drip, GERD, and asthma.  Medications Added to Medication List This Visit: 1)  Zafirlukast 20 Mg Tabs (Zafirlukast) .... One by mouth two times a day  Complete Medication List: 1)  Zafirlukast 20 Mg Tabs (Zafirlukast) .... One by mouth two times a day 2)  Fluticasone Propionate 50 Mcg/act Susp (Fluticasone propionate) .... 2 sprays each nostril daily at bedtime 3)  Allergy Relief 4 Mg Tabs (Chlorpheniramine maleate) .... Per box 4)  Proair Hfa 108 (90 Base) Mcg/act Aers (Albuterol sulfate) .Marland Kitchen.. 1-2 puffs every 4-6 hours as needed 5)  Qvar 40 Mcg/act Aers (Beclomethasone dipropionate) .... 2 puffs every 12 hours 6)  Omeprazole 20 Mg Cpdr (Omeprazole) .Marland Kitchen.. 1 by mouth two times a day before meal 7)  Hydrochlorothiazide 25 Mg Tabs (Hydrochlorothiazide) .... One daily 8)  Vitamin C 500 Mg Tabs (Ascorbic acid) .... Take 1 tablet by mouth once a day 9)  Zinc 50 Mg Tabs (Zinc) .... Take 1 tablet by mouth once a day 10)  Zantac 150 Mg Tabs (Ranitidine hcl) .... One at bedtime 11)  Biotin Maximum Strength 5000 Mcg Caps (Biotin) .Marland Kitchen.. 1 every morning 12)  Klor-con M10 10 Meq Cr-tabs (Potassium chloride crys cr) .... 3 by mouth once daily 13)  Diltiazem Hcl Cr 240 Mg Xr24h-cap (Diltiazem hcl) .Marland Kitchen.. 1 by mouth once daily 14)  Mucinex Dm 30-600 Mg Tb12 (Dextromethorphan-guaifenesin) .Marland Kitchen.. 1-2 tablets every 12 hours as needed 15)  Tramadol Hcl 50 Mg Tabs (Tramadol hcl) .Marland Kitchen.. 1 every 4 hours as needed 16)  Sorbitol 70 % Soln (Sorbitol) .... 2 tablespoons at bedtime 17)  Aleve 220 Mg Tabs (Naproxen sodium) .... Per bottle 18)  Augmentin  875-125 Mg Tabs (Amoxicillin-pot clavulanate) .Marland Kitchen.. 1 by mouth two times a day 19)  Afrin Nasal Spray 0.05 % Soln (Oxymetazoline hcl) .... 2 puffs two times a day for 5 days  Patient Instructions: 1)  Use nasal irrigation once daily  2)  Fluticasone nasal spray once daily  3)  Use salt water gargle two  times a day as needed  4)  Qvar two sprays two times a day 5)  Albuterol two puffs up to four times per day as needed 6)  Finish course of augmentin (antibiotic) 7)  Zafirlukast 20 mg two times a day  8)  Continue omeprazole once daily 30 minutes before 1st meal of the day 9)  Follow up with Dr. Sherene Sires in 4 weeks Prescriptions: QVAR 40 MCG/ACT AERS (BECLOMETHASONE DIPROPIONATE) 2 puffs every 12 hours  #1 x 3   Entered and Authorized by:   Coralyn Helling MD   Signed by:   Coralyn Helling MD on 05/17/2010   Method used:   Electronically to        [facility]. [phone]* (retail)       [address]       [place], [place]  [address]       Ph: [phone]       Fax: [phone]   RxID:   [id number] PROAIR HFA 108 (90 BASE) MCG/ACT AERS (ALBUTEROL SULFATE) 1-2 puffs every 4-6 hours as needed  #1 x 3   Entered and Authorized by:   Coralyn Helling MD   Signed by:   Coralyn Helling MD on 05/17/2010   Method used:   Electronically to        [facility]. [phone]* (retail)       [address]       [place], [place]  [address]       Ph: [phone]       Fax: [phone]   RxID:   [id number] FLUTICASONE PROPIONATE 50 MCG/ACT  SUSP (FLUTICASONE PROPIONATE) 2 sprays each nostril daily at bedtime  #1 x 3   Entered and Authorized by:   Coralyn Helling MD   Signed by:   Coralyn Helling MD on 05/17/2010   Method used:   Electronically to        [facility]. [phone]* (retail)       [address]       [place], [place]  [address]       Ph: [phone]       Fax: [phone]   RxID:   [id number] ZAFIRLUKAST 20 MG  TABS (ZAFIRLUKAST) one by mouth two times a day  #40 x 3   Entered and Authorized by:   Coralyn Helling MD   Signed by:   Coralyn Helling MD on 05/17/2010   Method used:   Electronically to        [facility]. [phone]* (retail)       [address]       [place], [place]  [address]       Ph: [phone]       Fax: [phone]   RxID:   [id number]
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
## 2011-01-03 NOTE — Assessment & Plan Note (Signed)
Summary: Pulmonary/ ext ov for cough eval, add back tramadol and flutter   Primary Provider/Referring Provider:  Corwin Levins MD  CC:  Acute visit.  Pt c/o wheezing and cough x 3 wks.  Cough is occ prod with white to green colored sputum.  She states that she coughs to the point of gagging.  Marland Kitchen  History of Present Illness: 60 yo AAF with known hx never smoker with previous history of difficult to control cough and wheeze attributed to rhinosinusitis and reflux seen in this office 6/08 with mostly upper airway symptoms recommended maintenance Qvar.    December 09, 2009 Followup.  Pt states that she still has the same cough since Nov 2010.  She states cough is prod with clear or white sputum.  She states that her breathing is the same- no better or worse not able to afford many of her meds and confides not followng calendar because of this.    01/03/10--Presents for follow up and med review. Doing well since last visit. Bronchitic flare tx w/ steroid burst and GERD prevention. She is unable to afford brand name PPI. New med cal provided, lost it.  Mid May 2011 started with pnds then evolved to refractory cough despite narcotic cough meds, multiple abx and prednisone from multiple providers in this office and "no better".  May 31, 2010 Acute visit.  Pt c/o wheezing and cough x 3 wks.  Cough is occ prod with white to green colored sputum.  She states that she coughs to the point of gagging.  Non compliant with med calendar (produced two old ones she's not following anyway bu not the most recent provided and totally disorganized with meds.  Pt denies any significant sore throat, dysphagia, itching, sneezing,  fever, chills, sweats, unintended wt loss, pleuritic or exertional cp, hempoptysis, change in activity tolerance  orthopnea pnd or leg swelling . Pt also denies any obvious fluctuation in symptoms with weather or environmental change or other alleviating or aggravating factors.     Having to use saba  every few hours around the clock for relief of sob.   Current Medications (verified): 1)  Zafirlukast 20 Mg Tabs (Zafirlukast) .... One By Mouth Two Times A Day 2)  Fluticasone Propionate 50 Mcg/act  Susp (Fluticasone Propionate) .... 2 Sprays Each Nostril Daily At Bedtime 3)  Proair Hfa 108 (90 Base) Mcg/act Aers (Albuterol Sulfate) .Marland Kitchen.. 1-2 Puffs Every 4-6 Hours As Needed 4)  Qvar 40 Mcg/act Aers (Beclomethasone Dipropionate) .... 2 Puffs Every 12 Hours 5)  Omeprazole 20 Mg Cpdr (Omeprazole) .Marland Kitchen.. 1 By Mouth Two Times A Day Before Meal 6)  Hydrochlorothiazide 25 Mg Tabs (Hydrochlorothiazide) .... One Daily 7)  Zinc 50 Mg Tabs (Zinc) .... Take 1 Tablet By Mouth Once A Day 8)  Zantac 150 Mg  Tabs (Ranitidine Hcl) .... One At Bedtime 9)  Biotin Maximum Strength 5000 Mcg Caps (Biotin) .Marland Kitchen.. 1 Every Morning 10)  Klor-Con M10 10 Meq Cr-Tabs (Potassium Chloride Crys Cr) .... 3 By Mouth Once Daily 11)  Diltiazem Hcl Cr 240 Mg Xr24h-Cap (Diltiazem Hcl) .Marland Kitchen.. 1 By Mouth Once Daily 12)  Mucinex Dm 30-600 Mg  Tb12 (Dextromethorphan-Guaifenesin) .Marland Kitchen.. 1-2 Tablets Every 12 Hours As Needed 13)  Aleve 220 Mg Tabs (Naproxen Sodium) .... Per Bottle 14)  Afrin Nasal Spray 0.05 % Soln (Oxymetazoline Hcl) .... 2 Puffs Two Times A Day For 5 Days 15)  Tussionex Pennkinetic Er 8-10 Mg/24ml Lqcr (Chlorpheniramine-Hydrocodone) .... 5cc By Mouth Q12h As Needed  For Cough 16)  Echinacea 400 Mg Caps (Echinacea) .Marland Kitchen.. 1 Once Daily As Needed 17)  Loratadine 10 Mg Tabs (Loratadine) .Marland Kitchen.. 1 Once Daily 18)  Hydromet 5-1.5 Mg/90ml Syrp (Hydrocodone-Homatropine) .Marland Kitchen.. 1 Tsp Every 6 Hours As Needed For Cough 19)  Dulcolax 5 Mg Tbec (Bisacodyl) .... As Directed As Needed 20)  Sinus Wash Neti Pot 2300-700 Mg Kit (Sodium Chloride-Sodium Bicarb) .... Once Daily 21)  Saline Nasal Spray 0.65 % Soln (Saline) .Marland Kitchen.. 1 Spray Each Nostril Daily As Needed  Allergies (verified): 1)  ! * Ivp Dye  Past History:  Past Medical  History: Hypertension GERD Allergic Rhinitis/sinusitis..........................................................Marland KitchenShoemaker     - FESS 04/02/2009 Obesity Depression Colonic polyps, hx of Health Maintenance.....................................................................John Complex medical regimen       - New calendar provided May 31, 2010   Vital Signs:  Patient profile:   60 year old female Weight:      213 pounds O2 Sat:      98 % on Room air Temp:     97.5 degrees F oral Pulse rate:   102 / minute BP sitting:   106 / 70  (left arm) Cuff size:   large  Vitals Entered By: Vernie Murders (May 31, 2010 12:11 PM)  O2 Flow:  Room air  Physical Exam  Additional Exam:  ambulatory mod obese black female with a moderately nasal tone to her hoarse voice and a harsh upper airway sounding cough.  wt  223 December 09, 2009 >>220 01/03/10>>217 May 10, 2010 > 213 May 31, 2010  HEENT: nl dentition, Nasal mucosa pale./clear discharge, max tenderness  orophanx clear. . Nl external ear canals without cough reflex Neck without JVD/Nodes/TM Lungs trace end exp wheezing,predominantly upper airway pseudowheeze resolves with purse lip maneuver  RRR no s3 or murmur or increase in P2 Abd not examined Ext warm without calf tenderness, cyanosis clubbing or edema Skin warm and dry without lesions       Impression & Recommendations:  Problem # 1:  COUGH (ICD-786.2) The most common causes of chronic cough in immunocompetent adults include: upper airway cough syndrome (UACS), previously referred to as postnasal drip syndrome,  caused by variety of rhinosinus conditions; (2) asthma; (3) GERD; (4) chronic bronchitis from cigarette smoking or other inhaled environmental irritants; (5) nonasthmatic eosinophilic bronchitis; and (6) bronchiectasis. These conditions, singly or in combination, have accounted for up to 94% of the causes of chronic cough.  This is most c/w  Classic Upper airway cough  syndrome, so named because it's frequently impossible to sort out how much is  CR/sinusitis with freq throat clearing (which can be related to primary GERD)   vs  causing  secondary extra esophageal GERD from wide swings in gastric pressure that occur with throat clearing, promoting self use of mint and menthol lozenges that reduce the lower esophageal sphincter tone and exacerbate the problem further These are the same pts who not infrequently have failed to tolerate ace inhibitors,  dry powder inhalers or biphosphonates or report having reflux symptoms that don't respond to standard doses of PPI   For now focus on maintaining compliance with diet, ppi and cyclical cough.  I had an extended discussion with the patient today lasting 15 to 20 minutes of a 25 minute visit on the following issues:  Each maintenance medication was reviewed in detail including most importantly the difference between maintenance and as needed and under what circumstances the prns are to be used. This was done in the  context of generateing a new  medication calendar review which provided the patient with a user-friendly unambiguous mechanism for medication administration and reconciliation and provides an action plan for all active problems. It is critical that this be shown to every doctor  for modification during the office visit if necessary so the patient can use it as a working document.   Orders: Est. Patient Level IV (09811) Flutter Valve (91478)  Medications Added to Medication List This Visit: 1)  Echinacea 400 Mg Caps (Echinacea) .Marland Kitchen.. 1 once daily as needed 2)  Loratadine 10 Mg Tabs (Loratadine) .Marland Kitchen.. 1 once daily 3)  Hydromet 5-1.5 Mg/59ml Syrp (Hydrocodone-homatropine) .Marland Kitchen.. 1 tsp every 6 hours as needed for cough 4)  Dulcolax 5 Mg Tbec (Bisacodyl) .... As directed as needed 5)  Sinus Wash Neti Pot 2300-700 Mg Kit (Sodium chloride-sodium bicarb) .... Once daily 6)  Saline Nasal Spray 0.65 % Soln (Saline) .Marland Kitchen.. 1  spray each nostril daily as needed 7)  Prednisone 10 Mg Tabs (Prednisone) .... 4 each am x 2days, 2x2days, 1x2days and stop 8)  Tramadol Hcl 50 Mg Tabs (Tramadol hcl) .... One to two by mouth every 4-6 hours for cough 9)  Flutter Valve  .... Use as directed as needed for cough/gagging  Patient Instructions: 1)  GERD (REFLUX)  is a common cause of respiratory symptoms. It commonly presents without heartburn and can be treated with medication, but also with lifestyle changes including avoidance of late meals, excessive alcohol, smoking cessation, and avoid fatty foods, chocolate, peppermint, colas, red wine, and acidic juices such as orange juice. NO MINT OR MENTHOL PRODUCTS SO NO COUGH DROPS  2)  USE SUGARLESS CANDY INSTEAD (jolley ranchers)  3)  NO OIL BASED VITAMINS  4)  For cough/gag use flutter valve 5)  Prednisone 4 each am x 2days, 2x2days, 1x2days and stop 6)  See calendar for specific medication instructions and bring it back for each and every office visit for every healthcare provider you see.  Without it,  you may not receive the best quality medical care that we feel you deserve.  7)  See [name] w/in 2 weeks with all your medications, even over the counter meds, separated in two separate bags, the ones you take no matter what vs the ones you stop once you feel better and take only as needed.  She will generate for you a new user friendly medication calendar that will put [place] all on the same page re: your medication use.  Prescriptions: FLUTTER VALVE use as directed as needed for cough/gagging  #1 x 0   Entered by:   Vernie Murders   Authorized by:   Nyoka Cowden MD   Signed by:   Vernie Murders on 05/31/2010   Method used:   Print then Give to Patient   RxID:   [id number] TRAMADOL HCL 50 MG  TABS (TRAMADOL HCL) One to two by mouth every 4-6 hours for cough  #40 x 0   Entered and Authorized by:   Nyoka Cowden MD   Signed by:   Nyoka Cowden MD on 05/31/2010   Method used:    Electronically to        [facility]. [phone]* (retail)       [address]       [place], [place]  [address]       Ph: [phone]       Fax: [phone]  RxID:   [id number] PREDNISONE 10 MG  TABS (PREDNISONE) 4 each am x 2days, 2x2days, 1x2days and stop  #14 x 0   Entered and Authorized by:   Nyoka Cowden MD   Signed by:   Nyoka Cowden MD on 05/31/2010   Method used:   Electronically to        [facility]. [phone]* (retail)       [address]       [place], [place]  [address]       Ph: [phone]       Fax: [phone]   RxID:   [id number]

## 2011-01-03 NOTE — Progress Notes (Signed)
Summary: still coughing  Phone Note Call from Patient Call back at Home Phone [phone]   Caller: Patient Call For: [facility] Summary of Call: pt was recently seen by tp. also went to ED this past fri night for cough/ wheezing. today c/o cough w/ green phlegm. pt is still "hot/cold". no N or V. wants advise from nurse.  Initial call taken by: Tivis Ringer, CNA,  May 16, 2010 11:24 AM  Follow-up for Phone Call        saw TP 05-10-10 for sinus inf.  states this worsened friday so she went to the ED and was given tussionex.  having prod cough with green mucus, increased wheezing/SOB, chills/sweats.  appt made with [facility] tomorrow @ 2pm.  pt okay with this appt date/time/physician.  pt refused to go to [facility].  advised pt to go back to ER or UC if her symptoms worsen before appt tomorrow.  pt verbalized her understanding. Boone Master CNA/MA  May 16, 2010 11:51 AM

## 2011-01-03 NOTE — Progress Notes (Signed)
  Phone Note Other Incoming   Request: Send information Summary of Call: Request received from [employer] forwarded to [employer].

## 2011-01-03 NOTE — Assessment & Plan Note (Signed)
Summary: Acute NP office visit - sinus inf   Primary Provider/Referring Provider:  Corwin Levins MD  CC:  wheezing, hoarseness, dry cough, sinus pressure/congestion with green drainage, and PND x2weeks.  History of Present Illness: 59 yo AAF with known hx never smoker with previous history of difficult to control cough and wheeze attributed to rhinosinusitis and reflux seen in this office 6/08 with mostly upper airway symptoms recommended maintenance Qvar.     December 09, 2009 Followup.  Pt states that she still has the same cough since Nov 2010.  She states cough is prod with clear or white sputum.  She states that her breathing is the same- no better or worse not able to afford many of her meds and confides not followng calendar because of this.    01/03/10--Presents for follow up and med review. Doing well since last visit. Bronchitic flare tx w/ steroid burst and GERD prevention. She is unable to afford brand name PPI. She feels much better. We discussed her meds and updated her med calendar today. Denies chest pain, dyspnea, orthopnea, hemoptysis, fever, n/v/d, edema  May 10, 2010--Presents for an acute office visit. Complains of wheezing, hoarseness, dry cough, sinus pressure/congestion with green drainage, PND x2weeks. OTC not helping. Denies chest pain, dyspnea, orthopnea, hemoptysis, fever, n/v/d, edema, headache.   Medications Prior to Update: 1)  Omeprazole 20 Mg Cpdr (Omeprazole) .Marland Kitchen.. 1 By Mouth Two Times A Day Before Meal 2)  Dexilant 60 Mg Cpdr (Dexlansoprazole) .... Take 1 Capsule By Mouth Two Times A Day Before Meal 3)  Hydrochlorothiazide 25 Mg Tabs (Hydrochlorothiazide) .... One Daily 4)  Vitamin C 500 Mg Tabs (Ascorbic Acid) .... Take 1 Tablet By Mouth Once A Day 5)  Zinc 50 Mg Tabs (Zinc) .... Take 1 Tablet By Mouth Once A Day 6)  Zantac 150 Mg  Tabs (Ranitidine Hcl) .... One At Bedtime 7)  Biotin Maximum Strength 5000 Mcg Caps (Biotin) .Marland Kitchen.. 1 Every  Morning 8)  Klor-Con M10 10 Meq Cr-Tabs (Potassium Chloride Crys Cr) .... 3 By Mouth Once Daily 9)  Fluticasone Propionate 50 Mcg/act  Susp (Fluticasone Propionate) .... 2 Sprays Each Nostril Daily At Bedtime 10)  Singulair 10 Mg  Tabs (Montelukast Sodium) .... Once Daily At Bedtime 11)  Qvar 40 Mcg/act Aers (Beclomethasone Dipropionate) .... 2 Puffs Every 12 Hours 12)  Xyzal 5 Mg  Tabs (Levocetirizine Dihydrochloride) .... Take 1 Tab By Mouth At Bedtime 13)  Diltiazem Hcl Cr 240 Mg Xr24h-Cap (Diltiazem Hcl) .Marland Kitchen.. 1 By Mouth Once Daily 14)  Allergy Relief 4 Mg Tabs (Chlorpheniramine Maleate) .... Per Box 15)  Mucinex Dm 30-600 Mg  Tb12 (Dextromethorphan-Guaifenesin) .Marland Kitchen.. 1-2 Tablets Every 12 Hours As Needed 16)  Proair Hfa 108 (90 Base) Mcg/act Aers (Albuterol Sulfate) .Marland Kitchen.. 1-2 Puffs Every 4-6 Hours As Needed 17)  Tramadol Hcl 50 Mg Tabs (Tramadol Hcl) .Marland Kitchen.. 1 Every 4 Hours As Needed 18)  Sorbitol 70 % Soln (Sorbitol) .... 2 Tablespoons At Bedtime 19)  Aleve 220 Mg Tabs (Naproxen Sodium) .... Per Bottle 20)  Afrin Nasal Spray 0.05 % Soln (Oxymetazoline Hcl) .... 2 Puffs Two Times A Day For 5 Days 21)  Cephalexin 500 Mg Caps (Cephalexin) .Marland Kitchen.. 1po Three Times A Day  Current Medications (verified): 1)  Omeprazole 20 Mg Cpdr (Omeprazole) .Marland Kitchen.. 1 By Mouth Two Times A Day Before Meal 2)  Hydrochlorothiazide 25 Mg Tabs (Hydrochlorothiazide) .... One Daily 3)  Vitamin C 500 Mg Tabs (Ascorbic Acid) .Marland KitchenMarland KitchenMarland Kitchen  Take 1 Tablet By Mouth Once A Day 4)  Zinc 50 Mg Tabs (Zinc) .... Take 1 Tablet By Mouth Once A Day 5)  Zantac 150 Mg  Tabs (Ranitidine Hcl) .... One At Bedtime 6)  Biotin Maximum Strength 5000 Mcg Caps (Biotin) .Marland Kitchen.. 1 Every Morning 7)  Klor-Con M10 10 Meq Cr-Tabs (Potassium Chloride Crys Cr) .... 3 By Mouth Once Daily 8)  Fluticasone Propionate 50 Mcg/act  Susp (Fluticasone Propionate) .... 2 Sprays Each Nostril Daily At Bedtime 9)  Singulair 10 Mg  Tabs (Montelukast Sodium) .... Once Daily At  Bedtime 10)  Qvar 40 Mcg/act Aers (Beclomethasone Dipropionate) .... 2 Puffs Every 12 Hours 11)  Xyzal 5 Mg  Tabs (Levocetirizine Dihydrochloride) .... Take 1 Tab By Mouth At Bedtime 12)  Diltiazem Hcl Cr 240 Mg Xr24h-Cap (Diltiazem Hcl) .Marland Kitchen.. 1 By Mouth Once Daily 13)  Allergy Relief 4 Mg Tabs (Chlorpheniramine Maleate) .... Per Box 14)  Mucinex Dm 30-600 Mg  Tb12 (Dextromethorphan-Guaifenesin) .Marland Kitchen.. 1-2 Tablets Every 12 Hours As Needed 15)  Proair Hfa 108 (90 Base) Mcg/act Aers (Albuterol Sulfate) .Marland Kitchen.. 1-2 Puffs Every 4-6 Hours As Needed 16)  Tramadol Hcl 50 Mg Tabs (Tramadol Hcl) .Marland Kitchen.. 1 Every 4 Hours As Needed 17)  Sorbitol 70 % Soln (Sorbitol) .... 2 Tablespoons At Bedtime 18)  Aleve 220 Mg Tabs (Naproxen Sodium) .... Per Bottle 19)  Afrin Nasal Spray 0.05 % Soln (Oxymetazoline Hcl) .... 2 Puffs Two Times A Day For 5 Days  Allergies (verified): 1)  ! * Ivp Dye  Past History:  Family History: Last updated: 01/01/2008 Mother is aged 24 - DM II, asthma, and congestive heart failure.  Father deceased at age 33 - history of epilepsy  Social History: Last updated: 2008-07-24 Widowed - husband died 05/09/06 Occupation:  Museum/gallery curator Tobacco use-never Alcohol use-no 3 children - 1 died with homicide  Risk Factors: Smoking Status: never (12/12/2007)  Vital Signs:  Patient profile:   60 year old female Height:      62 inches Weight:      217 pounds BMI:     39.83 O2 Sat:      100 % on Room air Temp:     97.0 degrees F oral Pulse rate:   73 / minute BP sitting:   124 / 76  (left arm) Cuff size:   large  Vitals Entered By: Boone Master CNA/MA (May 10, 2010 11:58 AM)  O2 Flow:  Room air CC: wheezing, hoarseness, dry cough, sinus pressure/congestion with green drainage, PND x2weeks Is Patient Diabetic? No Comments Medications reviewed with patient Daytime contact number verified with patient. Boone Master CNA/MA  May 10, 2010 11:58 AM    Physical Exam  Additional  Exam:  general pleasant ambulatory mod obese black female with a moderately nasal tone to her hoarse voice and a harsh upper airway sounding cough.   wt 221 > 223 December 09, 2009 >>220 01/03/10>>217 May 10, 2010  HEENT: nl dentition, Nasal mucosa pale./clear discharge, max tenderness  orophanx clear. . Nl external ear canals without cough reflex Neck without JVD/Nodes/TM Lungs  coarse BS w/ no wheezing RRR no s3 or murmur or increase in P2 Abd not examined Ext warm without calf tenderness, cyanosis clubbing or edema Skin warm and dry without lesions       Impression & Recommendations:  Problem # 1:  SINUSITIS- ACUTE-NOS (ICD-461.9)  Augmentin 875mg  two times a day for 10 days with food.  Miucinex DM two  times a day as needed cough/congestion Hydromet 1-2 tsp every 4-6 hr as needed cough  ice chips, water, sugarless candy to avoid coughing throat clearing.  Please contact office for sooner follow up if symptoms do not improve or worsen  The following medications were removed from the medication list:    Cephalexin 500 Mg Caps (Cephalexin) .Marland Kitchen... 1po three times a day Her updated medication list for this problem includes:    Fluticasone Propionate 50 Mcg/act Susp (Fluticasone propionate) .Marland Kitchen... 2 sprays each nostril daily at bedtime    Mucinex Dm 30-600 Mg Tb12 (Dextromethorphan-guaifenesin) .Marland Kitchen... 1-2 tablets every 12 hours as needed    Afrin Nasal Spray 0.05 % Soln (Oxymetazoline hcl) .Marland Kitchen... 2 puffs two times a day for 5 days    Augmentin 875-125 Mg Tabs (Amoxicillin-pot clavulanate) .Marland Kitchen... 1 by mouth two times a day  Orders: Est. Patient Level III (34742)  Medications Added to Medication List This Visit: 1)  Augmentin 875-125 Mg Tabs (Amoxicillin-pot clavulanate) .Marland Kitchen.. 1 by mouth two times a day  Complete Medication List: 1)  Omeprazole 20 Mg Cpdr (Omeprazole) .Marland Kitchen.. 1 by mouth two times a day before meal 2)  Hydrochlorothiazide 25 Mg Tabs (Hydrochlorothiazide) .... One  daily 3)  Vitamin C 500 Mg Tabs (Ascorbic acid) .... Take 1 tablet by mouth once a day 4)  Zinc 50 Mg Tabs (Zinc) .... Take 1 tablet by mouth once a day 5)  Zantac 150 Mg Tabs (Ranitidine hcl) .... One at bedtime 6)  Biotin Maximum Strength 5000 Mcg Caps (Biotin) .Marland Kitchen.. 1 every morning 7)  Klor-con M10 10 Meq Cr-tabs (Potassium chloride crys cr) .... 3 by mouth once daily 8)  Fluticasone Propionate 50 Mcg/act Susp (Fluticasone propionate) .... 2 sprays each nostril daily at bedtime 9)  Singulair 10 Mg Tabs (Montelukast sodium) .... Once daily at bedtime 10)  Qvar 40 Mcg/act Aers (Beclomethasone dipropionate) .... 2 puffs every 12 hours 11)  Xyzal 5 Mg Tabs (Levocetirizine dihydrochloride) .... Take 1 tab by mouth at bedtime 12)  Diltiazem Hcl Cr 240 Mg Xr24h-cap (Diltiazem hcl) .Marland Kitchen.. 1 by mouth once daily 13)  Allergy Relief 4 Mg Tabs (Chlorpheniramine maleate) .... Per box 14)  Mucinex Dm 30-600 Mg Tb12 (Dextromethorphan-guaifenesin) .Marland Kitchen.. 1-2 tablets every 12 hours as needed 15)  Proair Hfa 108 (90 Base) Mcg/act Aers (Albuterol sulfate) .Marland Kitchen.. 1-2 puffs every 4-6 hours as needed 16)  Tramadol Hcl 50 Mg Tabs (Tramadol hcl) .Marland Kitchen.. 1 every 4 hours as needed 17)  Sorbitol 70 % Soln (Sorbitol) .... 2 tablespoons at bedtime 18)  Aleve 220 Mg Tabs (Naproxen sodium) .... Per bottle 19)  Afrin Nasal Spray 0.05 % Soln (Oxymetazoline hcl) .... 2 puffs two times a day for 5 days 20)  Augmentin 875-125 Mg Tabs (Amoxicillin-pot clavulanate) .Marland Kitchen.. 1 by mouth two times a day  Other Orders: Nebulizer Tx ([id number])  Patient Instructions: 1)  Augmentin 875mg  two times a day for 10 days with food.  2)  Miucinex DM two times a day as needed cough/congestion 3)  Hydromet 1-2 tsp every 4-6 hr as needed cough  4)  ice chips, water, sugarless candy to avoid coughing throat clearing.  5)  Please contact office for sooner follow up if symptoms do not improve or worsen  Prescriptions: AUGMENTIN 875-125 MG TABS  (AMOXICILLIN-POT CLAVULANATE) 1 by mouth two times a day  #20 x 0   Entered and Authorized by:   Rubye Oaks NP   Signed by:   Rubye Oaks NP on  05/10/2010   Method used:   Electronically to        [facility]. [phone]* (retail)       [address]       [place], [place]  [address]       Ph: [phone]       Fax: [phone]   RxID:   [id number]

## 2011-01-03 NOTE — Letter (Signed)
Summary: [employer]  [address]   [place], [place] [address]   Phone: [phone]  Fax: [phone]    Today's Date: June 13, 2010  Name of Patient: [facility]  The above named patient had a medical visit today at:  am / pm.  Please take this into consideration when reviewing the time away from work/school.    Special Instructions:  [  ] None  [  ] To be off the remainder of today, returning to the normal work / school schedule tomorrow.  [ X ] To be off until the next scheduled appointment on June 20, 2010 @ 1:45pm.  [  ] Other ________________________________________________________________ ________________________________________________________________________   Sincerely yours,     Kaidin Boehle, N.P.

## 2011-01-03 NOTE — Letter (Signed)
Summary: Out of Work  [employer]  [address]   [place], [place] [address]   Phone: [phone]  Fax: [phone]    June 20, 2010   Employee:  Glenbeigh Corrigan    To Whom It May Concern:   For Medical reasons, please excuse the above named employee from work for the following dates:  Start:   06/20/10  End:   06/28/10  If you need additional information, please feel free to contact our office.         Sincerely,    Sandrea Hughs, MD

## 2011-01-03 NOTE — Letter (Signed)
Summary: [employer]  [address]   [place], [place] [address]   Phone: [phone]  Fax: [phone]    Today's Date: May 17, 2010  Name of Patient: [facility]  The above named patient had a medical visit today at: 2:00 pm.   Please take this into consideration when reviewing the time away from work.     Special Instructions:  [  ] None  [  ] To be off the remainder of today, returning to the normal work / school schedule tomorrow.  [ x ] To be off from work until _______Monday June 20,2011_______________.  [  ] Other ________________________________________________________________ ________________________________________________________________________   Sincerely yours,   Coralyn Helling, M.D.

## 2011-01-03 NOTE — Progress Notes (Signed)
Summary: [facility]  Phone Note Call from Patient Call back at [facility] Phone [phone]   Caller: Patient Call For: [facility] Summary of Call: cAN YOU PLEASE FAX NOTES FROM VISIT TODAY, 06/20/2010, TO [employer] [phone]  CLAIM # [id number]. Initial call taken by: Eugene Gavia,  June 20, 2010 4:11 PM  Follow-up for Phone Call        OV note from today's visit with MW faxed.  Arman Filter LPN  June 20, 2010 4:17 PM

## 2011-01-03 NOTE — Letter (Signed)
Summary: Out of Work  [facility]  [address] [place], [place] [address]   Phone: [phone]  Fax: [phone]    November 11, 2010   Employee:  [facility] [employer]    To Whom It May Concern:   For Medical reasons, please excuse the above named employee from work for the following dates:  Start:   11/08/10  End:   11/14/10   Sincerely,    Minus Breeding MD

## 2011-01-03 NOTE — Assessment & Plan Note (Signed)
Summary: Pulmonary/ ext f/u ov - hfa 50%    Primary Provider/Referring Provider:  Corwin Levins MD  CC:  Followup.  Pt states that cough has improved some- no more gagging.  Cough still prod with clear to light yellow sputum.  She states that she had wheezing "all night" and most of the day with exertion.  Marland Kitchen  History of Present Illness: 60 yo AAF with known hx never smoker with previous history of difficult to control cough and wheeze attributed to rhinosinusitis and reflux seen in this office 6/08 with mostly upper airway symptoms recommended maintenance Qvar.    December 09, 2009 Followup.  Pt states that she still has the same cough since Nov 2010.  She states cough is prod with clear or white sputum.  She states that her breathing is the same- no better or worse not able to afford many of her meds and confides not followng calendar because of this.    01/03/10--Presents for follow up and med review. Doing well since last visit. Bronchitic flare tx w/ steroid burst and GERD prevention. She is unable to afford brand name PPI. New med cal provided, lost it.  Mid May 2011 started with pnds then evolved to refractory cough despite narcotic cough meds, multiple abx and prednisone from multiple providers in this office and "no better".  May 31, 2010 Acute visit.  Pt c/o wheezing and cough x 3 wks.  Cough is occ prod with white to green colored sputum.  She states that she coughs to the point of gagging.  Non compliant with med calendar (produced two old ones she's not following anyway bu not the most recent provided and totally disorganized with meds.    Having to use saba every few hours around the clock for relief of sob.--given steroid taper.   June 13, 2010 --Returns for follow up and med review. Cough is no better. Complains of wheezing, prod cough with green mucus to the point of gagging and vomiting and thrush onset 7/10 CXR 05/13/10 showed no acute finding. She got slightly better w/ steroids  but cough does not go totally away. she is still out of work. rec ominicef, taper prednisone  June 20, 2010 Followup.  Pt states that cough has improved some- no more gagging.  Cough still prod with clear to light yellow sputum.  She states that she had wheezing "all night" and most of the day with exertion.  June 20, 2010 Followup.  Pt states that cough has improved some- no more gagging.  Cough still prod with clear to light yellow sputum.  She states that she had wheezing "all night" and most of the day with exertion.  still using lots of tramadol, not using hfa or nasal steroids/ afrin appropriately. . Pt denies any significant sore throat, dysphagia, itching, sneezing,  nasal congestion or excess secretions,  fever, chills, sweats, unintended wt loss, pleuritic or exertional cp, hempoptysis, change in activity tolerance  orthopnea pnd or leg swelling Pt also denies any obvious fluctuation in symptoms with weather or environmental change or other alleviating or aggravating factors.      " Current Medications (verified): 1)  Omeprazole 20 Mg Cpdr (Omeprazole) .Marland Kitchen.. 1 By Mouth Two Times A Day Before Meal 2)  Hydrochlorothiazide 25 Mg Tabs (Hydrochlorothiazide) .... One Daily 3)  Vitamin C 500 Mg Tabs (Ascorbic Acid) .... Take 1 Tablet By Mouth Once A Day 4)  Zinc 50 Mg Tabs (Zinc) .... Take 1 Tablet By Mouth  Once A Day 5)  Zantac 150 Mg  Tabs (Ranitidine Hcl) .... One At Bedtime 6)  Biotin 1000 Mcg Tabs (Biotin) .... 3 Tabs By Mouth Once Daily 7)  Klor-Con M10 10 Meq Cr-Tabs (Potassium Chloride Crys Cr) .... 3 By Mouth Once Daily 8)  Fluticasone Propionate 50 Mcg/act  Susp (Fluticasone Propionate) .... 2 Sprays Each Nostril Two Times A Day 9)  Zafirlukast 20 Mg Tabs (Zafirlukast) .... One By Mouth Two Times A Day 10)  Qvar 40 Mcg/act Aers (Beclomethasone Dipropionate) .... 2 Puffs Every 12 Hours 11)  Diltiazem Hcl Cr 240 Mg Xr24h-Cap (Diltiazem Hcl) .Marland Kitchen.. 1 By Mouth Once Daily 12)   Loratadine 10 Mg Tabs (Loratadine) .Marland Kitchen.. 1 Once Daily As Needed 13)  Mucinex Dm 30-600 Mg  Tb12 (Dextromethorphan-Guaifenesin) .Marland Kitchen.. 1-2 Tablets Every 12 Hours As Needed 14)  Proair Hfa 108 (90 Base) Mcg/act Aers (Albuterol Sulfate) .Marland Kitchen.. 1-2 Puffs Every 4-6 Hours As Needed 15)  Tramadol Hcl 50 Mg  Tabs (Tramadol Hcl) .Marland Kitchen.. 1 Every 4 Hours As Needed 16)  Sorbitol 70 % Soln (Sorbitol) .... 2 Tbsp At Bedtime As Needed 17)  Aleve 220 Mg Tabs (Naproxen Sodium) .... Per Bottle 18)  Afrin Nasal Spray 0.05 % Soln (Oxymetazoline Hcl) .... 2 Puffs Two Times A Day For 5 Days 19)  Saline Nasal Spray 0.65 % Soln (Saline) .... 2 Sprays Each Nostril As Needed 20)  Flutter Valve .... Use As Directed As Needed For Cough/gagging 21)  Cefdinir 300 Mg Caps (Cefdinir) .Marland Kitchen.. 1 By Mouth Two Times A Day 22)  Prednisone 10 Mg Tabs (Prednisone) .... 4 Tabs For 2 Days, Then 3 Tabs For 2 Days, 2 Tabs For 2 Days, Then 1 Tab For 2 Days, Then Stop  Allergies (verified): 1)  ! * Ivp Dye  Past History:  Past Medical History: Hypertension GERD Allergic Rhinitis/sinusitis..........................................................Marland KitchenShoemaker     - FESS 04/02/2009      - Allergy profile June 20, 2010 >> no eos,  Asthma      - HFA 25% June 20, 2010  Obesity Depression Colonic polyps, hx of Health Maintenance.....................................................................John Complex medical regimen       - New calendar provided May 31, 2010 , June 13, 2010  Vital Signs:  Patient profile:   60 year old female Weight:      210 pounds O2 Sat:      99 % on Room air Temp:     97.6 degrees F oral Pulse rate:   80 / minute BP sitting:   116 / 72  (left arm) Cuff size:   large  Vitals Entered By: Vernie Murders (June 20, 2010 1:38 PM)  O2 Flow:  Room air  Physical Exam  Additional Exam:  ambulatory mod obese black female with a moderately nasal tone to her hoarse voice and a harsh upper airway sounding cough.  wt   223 December 09, 2009 >>220 01/03/10>>217 May 10, 2010 > 213 May 31, 2010 >>210 June 13, 2010 > 210 June 20, 2010  HEENT: nl dentition, Nasal mucosa pale./clear discharge,  orophanx clear. . Nl external ear canals without cough reflexno thrush noted.  Neck without JVD/Nodes/TM Lungs trace end exp wheezing,predominantly upper airway pseudowheeze resolves with purse lip maneuver  RRR no s3 or murmur or increase in P2 Abd not examined Ext warm without calf tenderness, cyanosis clubbing or edema Skin warm and dry without lesions       White Cell Count  8.5 K/uL                    4.5-10.5   Red Cell Count            4.38 Mil/uL                 3.87-5.11   Hemoglobin                13.1 g/dL                   16.1-09.6   Hematocrit                38.3 %                      36.0-46.0   MCV                       87.6 fl                     78.0-100.0   MCHC                      34.1 g/dL                   04.5-40.9   RDW                       13.7 %                      11.5-14.6   Platelet Count            224.0 K/uL                  150.0-400.0   Neutrophil %              67.7 %                      43.0-77.0   Lymphocyte %              23.6 %                      12.0-46.0   Monocyte %                5.9 %                       3.0-12.0   Eosinophils%              2.3 %                       0.0-5.0   Basophils %               0.5 %                       0.0-3.0   Neutrophill Absolute      5.8 K/uL                    1.4-7.7   Lymphocyte Absolute       2.0 K/uL                    0.7-4.0   Monocyte Absolute  0.5 K/uL                    0.1-1.0  Eosinophils, Absolute                             0.2 K/uL                    0.0-0.7   Basophils Absolute        0.0 K/uL                    0.0-0.1  Impression & Recommendations:  Problem # 1:  COUGH (ICD-786.2)   DDX of  difficult airways managment all start with A and  include Adherence, Ace Inhibitors, Acid Reflux, Active  Sinus Disease, Alpha 1 Antitripsin deficiency, Anxiety masquerading as Airways dz,  ABPA,  allergy(esp in young), Aspiration (esp in elderly), Adverse effects of DPI,  Active smokers, plus one B  = Beta blocker use.Marland Kitchen    Active sinus dz is greatest concern, refer back to [facility]  ? Acid reflux, reviewed meds/diet  ? Adherence.   Each maintenance medication was reviewed in detail including most importantly the difference between maintenance and as needed and under what circumstances the prns are to be used. See calendar for specific medication instructions and bring it back for each and every office visit for every healthcare provider you see.  Without it,  you may not receive the best quality medical care that we feel you deserve. I spent extra time with the patient today explaining optimal mdi  technique.  This improved from  25-50%   ? Allergies:  send profile   Orders: Est. Patient Level IV ([address]109)  Problem # 2:  ALLERGIC RHINITIS (ICD-477.9)  Her updated medication list for this problem includes:    Fluticasone Propionate 50 Mcg/act Susp (Fluticasone propionate) .Marland Kitchen... 2 sprays each nostril two times a day    Loratadine 10 Mg Tabs (Loratadine) .Marland Kitchen... 1 once daily as needed    Afrin Nasal Spray 0.05 % Soln (Oxymetazoline hcl) .Marland Kitchen... 2 puffs two times a day for 5 days    Saline Nasal Spray 0.65 % Soln (Saline) .Marland Kitchen... 2 sprays each nostril as needed  See instructions for specific recommendations   Orders: Est. Patient Level IV (60454)  Medications Added to Medication List This Visit: 1)  Prednisone 10 Mg Tabs (Prednisone) .... Take as directeed on med calendar  Other Orders: T-Allergy Profile Region [place], [place], [place], , [place] (5484) TLB-CBC Platelet - w/Differential (85025-CBCD) HFA Instruction [id number])  Patient Instructions: 1)  I emphasized that nasal steroids (fluticasone) have no immediate benefit in terms of improving symptoms.  To help them reached the target tissue, the patient  should use Afrin two puffs every 12 hours applied one min before using the nasal steroids.  Afrin should be stopped after no more than 5 days.  If the symptoms worsen, Afrin can be restarted after 5 days off of therapy to prevent rebound congestion from overuse of Afrin.  I also emphasized that in no way are nasal steroids a concern in terms of "addiction". 2)  Prednisone 10mg   2 daily until 100% then 1 daily for 5 days and one half daily for 5 days and off 3)  Work on inhaler technique:  relax and blow all the way out then take a nice smooth deep breath back in, triggering the inhaler at same time you start breathing  in hold a few seconds 4)  Call Dr Annalee Genta today for follow up 5)  If not able to work by the 26th return to clinic or let Dr Melanie Crazier Prescriptions: PREDNISONE 10 MG TABS (PREDNISONE) take as directeed on med calendar  #50 x 0   Entered and Authorized by:   Nyoka Cowden MD   Signed by:   Nyoka Cowden MD on 06/20/2010   Method used:   Electronically to        [facility]. [phone]* (retail)       [address]       [place], [place]  [address]       Ph: [phone]       Fax: [phone]   RxID:   [id number]

## 2011-01-03 NOTE — Assessment & Plan Note (Signed)
Summary: acute visit-kcw   Primary Provider/Referring Provider:  Corwin Levins MD  CC:  Acute visit-Sinus problems; stuffy-blows green in color, wheezing, and cough-non productive.Marland Kitchen  History of Present Illness: August 10, 2010- Allergic rhinosinusitis, nasal polyps, Asthma, Off antihistamines for skin testing. Eyes watering more than usual in rain yesterday. Feels hoarse with some residual sore throat. Left frontotemporal headache. Not blowing from nose. Yesterday sneezed a lot. Ears ok. No recent use of proair, but says she should have used it yesterday- that passed. Going for f/u Chest CT when she leaves here. Skin test- Positive grass, weed, dust, some trees  August 25, 2010- Allergic rhinosinustis, nasal polyps, asthma, abnl CT (Dr Sherene Sires) Acute vist. One week ago walked 1-2 blocks from work to lunch. By the time she returned she was short of breath, tight nose and chest. Her rescue inhaler didn't work well. Went to ER [facility]. They gave prednisone to take for 5 days- off x 2 days. It did help some.  Now has left frontal pressure, postnasal drip, hoarse, scratchy throat, chest now ok ("at times feel short "). Ears ok.  I find CT report with stable tubular RML opacity, ? mucus. I have not been following her for that- sees Dr Sherene Sires.   October 28, 2010-  Allergic rhinosinustis, nasal polyps, asthma, abnl CT (Dr Sherene Sires) Nurse-CC: Acute visit-Sinus problems; stuffy-blows green in color,wheezing,cough-non productive. She felt better for a least a few weeks after last here. then again head stopped up, productive blowing nose- clear to green, right eye runs, hoarse w/o sore throat, dry cough. Feet began itching- takes loratadine for that. Began wheezing. She took loratadine, flushed nasal saline, used albuterol.  No recent fever. GI ok.    Asthma History    Asthma Control Assessment:    Age range: 12+ years    Symptoms: >2 days/week    Nighttime Awakenings: 0-2/month    Interferes w/ normal  activity: no limitations    SABA use (not for EIB): >2 days/week    Asthma Control Assessment: Not Well Controlled   Preventive Screening-Counseling & Management  Alcohol-Tobacco     Smoking Status: never  Current Medications (verified): 1)  Omeprazole 20 Mg Cpdr (Omeprazole) .Marland Kitchen.. 1 By Mouth Two Times A Day Before Meal 2)  Hydrochlorothiazide 25 Mg Tabs (Hydrochlorothiazide) .... One Daily 3)  Vitamin C 500 Mg Tabs (Ascorbic Acid) .... Take 1 Tablet By Mouth Once A Day 4)  Zinc 50 Mg Tabs (Zinc) .... Take 1 Tablet By Mouth Once A Day 5)  Zantac 150 Mg  Tabs (Ranitidine Hcl) .... One At Bedtime 6)  Biotin 1000 Mcg Tabs (Biotin) .... 3 Tabs By Mouth Once Daily 7)  Klor-Con M10 10 Meq Cr-Tabs (Potassium Chloride Crys Cr) .... 4 By Mouth Once Daily 8)  Fluticasone Propionate 50 Mcg/act  Susp (Fluticasone Propionate) .... 2 Sprays Each Nostril Two Times A Day 9)  Zafirlukast 20 Mg Tabs (Zafirlukast) .... One By Mouth Two Times A Day 10)  Qvar 40 Mcg/act Aers (Beclomethasone Dipropionate) .... 2 Puffs Every 12 Hours 11)  Diltiazem Hcl Cr 240 Mg Xr24h-Cap (Diltiazem Hcl) .Marland Kitchen.. 1 By Mouth Once Daily 12)  Loratadine 10 Mg Tabs (Loratadine) .Marland Kitchen.. 1 Once Daily As Needed 13)  Mucinex Dm 30-600 Mg  Tb12 (Dextromethorphan-Guaifenesin) .Marland Kitchen.. 1-2 Tablets Every 12 Hours As Needed 14)  Proair Hfa 108 (90 Base) Mcg/act Aers (Albuterol Sulfate) .Marland Kitchen.. 1-2 Puffs Every 4-6 Hours As Needed 15)  Tramadol Hcl 50 Mg  Tabs (Tramadol  Hcl) .... 1 Every 4 Hours As Needed 16)  Sorbitol 70 % Soln (Sorbitol) .... 2 Tbsp At Bedtime As Needed 17)  Afrin Nasal Spray 0.05 % Soln (Oxymetazoline Hcl) .... 2 Puffs Two Times A Day For 5 Days 18)  Saline Nasal Spray 0.65 % Soln (Saline) .... 2 Sprays Each Nostril As Needed 19)  Flutter Valve .... Use As Directed As Needed For Cough/gagging  Allergies (verified): 1)  ! * Ivp Dye  Past History:  Past Medical History: Last updated: 08/10/2010 Hypertension GERD Allergic  Rhinitis/sinusitis..........................................................Marland KitchenShoemaker (nasal polypectomy x 2)     - FESS 04/02/2009      - Allergy profile June 20, 2010 >> no eos,  IgE 1697 > refer to Dr Maple Hudson 08-08-2010      - Skin Test- Pos grass, weeds, dust, some trees Asthma      - HFA 25% June 20, 2010 >   75% June 28, 2010  Obesity Depression Colonic polyps, hx of Health Maintenance.....................................................................John Complex medical regimen       - New calendar provided May 31, 2010 , June 13, 2010  Past Surgical History: Last updated: 2010/08/08 Status post sinus surgery in August 2007 with Dr. Annalee Genta. Nasal polypectomy x 2 Status post foot surgery in 1997/04/25. Tubal ligation  Family History: Last updated: August 08, 2010 Mother is aged 72 - DM II, asthma, allergies, and congestive heart failure.  Father deceased at age 67 - history of epilepsy  Social History: Last updated: 2010/08/08 Widowed - husband died 25-Apr-2006 Occupation:  Museum/gallery curator at [employer] Tobacco use-never Alcohol use-no 3 children - 1 died with homicide She lives with another son and his wife- he has had second double lung transpant after complications of MVA.  Risk Factors: Smoking Status: never (10/28/2010)  Review of Systems      See HPI       The patient complains of shortness of breath with activity, productive cough, non-productive cough, nasal congestion/difficulty breathing through nose, and sneezing.  The patient denies coughing up blood, chest pain, irregular heartbeats, acid heartburn, indigestion, loss of appetite, weight change, abdominal pain, difficulty swallowing, tooth/dental problems, ear ache, hand/feet swelling, and rash.    Vital Signs:  Patient profile:   60 year old female Height:      62 inches Weight:      200.38 pounds BMI:     36.78 O2 Sat:      97 % on Room air Pulse rate:   101 / minute BP sitting:   122 / 82  (right arm) Cuff size:    large  Vitals Entered By: Reynaldo Minium CMA (October 28, 2010 1:37 PM)  O2 Flow:  Room air CC: Acute visit-Sinus problems; stuffy-blows green in color,wheezing,cough-non productive.   Physical Exam  Additional Exam:  General: A/Ox3; pleasant and cooperative, NAD, SKIN: no rash, lesions NODES: no lymphadenopathy HEENT: [place]/AT, EOM- WNL, Conjuctivae- clear, PERRLA, TM-WNL, Nose- audibly stuffy but not visibly obstructed, no polyps seen, Throat- clear and wnl, Mallampati  II, no cobblestone or drainage, Voice is hoarse but throat is not red and there is no stridor NECK: Supple w/ fair ROM, JVD- none, normal carotid impulses w/o bruits Thyroid- normal to palpation,  CHEST: Wheeze and dry cough, norhonchi HEART: RRR, no m/g/r heard ABDOMEN: overweight XBJ:YNWG, nl pulses, no edema  NEURO: Grossly intact to observation      Impression & Recommendations:  Problem # 1:  RHINOSINUSITIS, CHRONIC (ICD-473.8) Exaceration of rhinosinusitis. We will continue nasal saline lavage w/ Neti pot  and we will give Avelox.  Problem # 2:  ACUTE BRONCHITIS (ICD-466.0)  Probable early ttracheobronchits. Avelox should help if there is a bacterial component.  The following medications were removed from the medication list:    Amoxicillin-pot Clavulanate 875-125 Mg Tabs (Amoxicillin-pot clavulanate) .Marland Kitchen... 1 twice dialy    Augmentin 875-125 Mg Tabs (Amoxicillin-pot clavulanate) .Marland Kitchen... 1 by mouth two times a day until gone Her updated medication list for this problem includes:    Zafirlukast 20 Mg Tabs (Zafirlukast) ..... One by mouth two times a day    Qvar 40 Mcg/act Aers (Beclomethasone dipropionate) .Marland Kitchen... 2 puffs every 12 hours    Mucinex Dm 30-600 Mg Tb12 (Dextromethorphan-guaifenesin) .Marland Kitchen... 1-2 tablets every 12 hours as needed    Proair Hfa 108 (90 Base) Mcg/act Aers (Albuterol sulfate) .Marland Kitchen... 1-2 puffs every 4-6 hours as needed    Avelox 400 Mg Tabs (Moxifloxacin hcl) .Marland Kitchen... 1 daily x 7  days  Medications Added to Medication List This Visit: 1)  Avelox 400 Mg Tabs (Moxifloxacin hcl) .Marland Kitchen.. 1 daily x 7 days  Other Orders: Est. Patient Level III (04540) Nebulizer Tx (98119)  Patient Instructions: 1)  Please schedule a follow-up appointment in 4 months. 2)  Script for Avelox antibiotic sent to drug store 3)  Neb neo nasal 4)  Continue your usual meds and the Neti pot Prescriptions: AVELOX 400 MG TABS (MOXIFLOXACIN HCL) 1 daily x 7 days  #7 x 0   Entered and Authorized by:   Waymon Budge MD   Signed by:   Waymon Budge MD on 10/28/2010   Method used:   Electronically to        [facility]. [phone]* (retail)       [address]       [place], [place]  [address]       Ph: [phone]       Fax: [phone]   RxID:   [id number]      Medication Administration  Medication # 1:    Medication: EMR miscellaneous medications    Diagnosis: RHINOSINUSITIS, CHRONIC (ICD-473.8)    Dose: 3 drops     Route: intranasal    Exp Date: 03/2012    Lot #: [id number]    Mfr: Bayer    Comments: Neo-Synephrine    Patient tolerated medication without complications    Given by: Reynaldo Minium CMA (October 28, 2010 2:11 PM)  Orders Added: 1)  Est. Patient Level III [63875] 2)  Nebulizer Tx [id number]

## 2011-01-03 NOTE — Miscellaneous (Signed)
Summary: Orders Update   Clinical Lists Changes  Problems: Added new problem of COMPUTERIZED TOMOGRAPHY, CHEST, ABNORMAL (ICD-793.1) Orders: Added new Referral order of Radiology Referral (Radiology) - Signed

## 2011-01-03 NOTE — Assessment & Plan Note (Signed)
Summary: cough, PND   Visit Type:  Follow-up Primary Provider/Referring Provider:  Corwin Levins MD  CC:  Acute visit.  Wert patient. The patient c/o persistant cough with light green mucus x2-3 weeks now. Any activity makes the cough worse.Marland Kitchen  History of Present Illness: 60 yo AAF with known hx never smoker with previous history of difficult to control cough and wheeze attributed to rhinosinusitis and reflux seen in this office 6/08 with mostly upper airway symptoms recommended maintenance Qvar.    December 09, 2009 Followup.  Pt states that she still has the same cough since Nov 2010.  She states cough is prod with clear or white sputum.  She states that her breathing is the same- no better or worse not able to afford many of her meds and confides not followng calendar because of this.    01/03/10--Presents for follow up and med review. Doing well since last visit. Bronchitic flare tx w/ steroid burst and GERD prevention. She is unable to afford brand name PPI. She feels much better. We discussed her meds and updated her med calendar today. Denies chest pain, dyspnea, orthopnea, hemoptysis, fever, n/v/d, edema  May 10, 2010--Presents for an acute office visit. Complains of wheezing, hoarseness, dry cough, sinus pressure/congestion with green drainage, PND x2weeks. OTC not helping. Denies chest pain, dyspnea, orthopnea, hemoptysis, fever, n/v/d, edema, headache.   May 17, 2010 -- She has improved some since starting augmentin.  She was in the ER on 06/10 for cough.  She was given hydrocodone.  She still has cough, but sputum is now clear.  She has wheeze from her throat.  No fever or chest pain.  Had nausea, but now better.  She was using albuterol alot, and this helped.  She can't afford singulair.  Acute visit 05/23/10 -- Hx of chronic Rhinosinusitis, cough, flaring for approx last 3 weeks. Has been rx with Augmentin for ? bronchitis, also with [place], fluticasone spray. last time started on  zafirlukast. She never started the NSWs. Asking for a note to stay out of work.   Current Medications (verified): 1)  Zafirlukast 20 Mg Tabs (Zafirlukast) .... One By Mouth Two Times A Day 2)  Fluticasone Propionate 50 Mcg/act  Susp (Fluticasone Propionate) .... 2 Sprays Each Nostril Daily At Bedtime 3)  Allergy Relief 4 Mg Tabs (Chlorpheniramine Maleate) .... Per Box 4)  Proair Hfa 108 (90 Base) Mcg/act Aers (Albuterol Sulfate) .Marland Kitchen.. 1-2 Puffs Every 4-6 Hours As Needed 5)  Qvar 40 Mcg/act Aers (Beclomethasone Dipropionate) .... 2 Puffs Every 12 Hours 6)  Omeprazole 20 Mg Cpdr (Omeprazole) .Marland Kitchen.. 1 By Mouth Two Times A Day Before Meal 7)  Hydrochlorothiazide 25 Mg Tabs (Hydrochlorothiazide) .... One Daily 8)  Vitamin C 500 Mg Tabs (Ascorbic Acid) .... Take 1 Tablet By Mouth Once A Day 9)  Zinc 50 Mg Tabs (Zinc) .... Take 1 Tablet By Mouth Once A Day 10)  Zantac 150 Mg  Tabs (Ranitidine Hcl) .... One At Bedtime 11)  Biotin Maximum Strength 5000 Mcg Caps (Biotin) .Marland Kitchen.. 1 Every Morning 12)  Klor-Con M10 10 Meq Cr-Tabs (Potassium Chloride Crys Cr) .... 3 By Mouth Once Daily 13)  Diltiazem Hcl Cr 240 Mg Xr24h-Cap (Diltiazem Hcl) .Marland Kitchen.. 1 By Mouth Once Daily 14)  Mucinex Dm 30-600 Mg  Tb12 (Dextromethorphan-Guaifenesin) .Marland Kitchen.. 1-2 Tablets Every 12 Hours As Needed 15)  Tramadol Hcl 50 Mg Tabs (Tramadol Hcl) .Marland Kitchen.. 1 Every 4 Hours As Needed 16)  Sorbitol 70 % Soln (Sorbitol) .Marland KitchenMarland KitchenMarland Kitchen  2 Tablespoons At Bedtime 17)  Aleve 220 Mg Tabs (Naproxen Sodium) .... Per Bottle 18)  Afrin Nasal Spray 0.05 % Soln (Oxymetazoline Hcl) .... 2 Puffs Two Times A Day For 5 Days  Allergies (verified): 1)  ! * Ivp Dye  Vital Signs:  Patient profile:   60 year old female Height:      62 inches (157.48 cm) Weight:      213 pounds (96.82 kg) BMI:     39.10 O2 Sat:      95 % on Room air Temp:     98.8 degrees F (37.11 degrees C) oral Pulse rate:   116 / minute BP sitting:   120 / 78  (left arm) Cuff size:    regular  Vitals Entered By: Michel Bickers CMA (May 23, 2010 3:45 PM)  O2 Sat at Rest %:  95 O2 Flow:  Room air  Physical Exam  Nose:  clear nasal discharge, no sinus tenderness Mouth:  mild erythema, no exudate Neck:  wheeze over throat Chest Wall:  no deformities noted Lungs:  faint wheeze radiating from throat Heart:  regular rhythm, normal rate, and no murmurs.   Abdomen:  bowel sounds positive; abdomen soft and non-tender without masses, or organomegaly Extremities:  no clubbing, cyanosis, edema, or deformity noted Cervical Nodes:  no significant adenopathy   Impression & Recommendations:  Problem # 1:  COUGH (ICD-786.2)  Due to UA irritation primarliy from PND. She never started the NSWs until this am. Will hold the QVAR temporarily as it may be exacerbating the cough  Increase fluticisone spray to 2 sprays each side two times a day  Stop QVAR for 5 days, then restart as you were taking Use albuterol as needed.  Start using Nasal saline washes once daily  Try decongestants that contain either bromphenerimine or chlorphenerimine. These will not interact with your current medications.  Continue your zafirlukast two times a day  Use tussionex 5cc by mouth q12h as needed for cough  Start taking loratadine 10mg  once daily  Follow up with Dr Sherene Sires as scheduled  Orders: Est. Patient Level IV ([address])  Problem # 2:  RHINOSINUSITIS, CHRONIC (ICD-473.8)  Problem # 3:  GERD (ICD-530.81)  Her updated medication list for this problem includes:    Omeprazole 20 Mg Cpdr (Omeprazole) .Marland Kitchen... 1 by mouth two times a day before meal    Zantac 150 Mg Tabs (Ranitidine hcl) ..... One at bedtime  Medications Added to Medication List This Visit: 1)  Tussionex Pennkinetic Er 8-10 Mg/74ml Lqcr (Chlorpheniramine-hydrocodone) .... 5cc by mouth q12h as needed for cough  Patient Instructions: 1)  Increase fluticisone spray to 2 sprays each side two times a day  2)  Stop QVAR for 5 days,  then restart as you were taking 3)  Use albuterol as needed.  4)  Start using Nasal saline washes once daily  5)  Try decongestants that contain either bromphenerimine or chlorphenerimine. These will not interact with your current medications.  6)  Continue your zafirlukast two times a day  7)  Use tussionex 5cc by mouth q12h as needed for cough  8)  Start taking loratadine 10mg  once daily  9)  Follow up with Dr Sherene Sires as scheduled Prescriptions: Sandria Senter ER 8-10 MG/5ML LQCR (CHLORPHENIRAMINE-HYDROCODONE) 5cc by mouth q12h as needed for cough  #4 oz x 0   Entered and Authorized by:   Leslye Peer MD   Signed by:   Leslye Peer MD on 05/23/2010  Method used:   Print then Give to Patient   RxID:   [id number]

## 2011-01-03 NOTE — Letter (Signed)
Summary: Out of Work  [employer]  [address]   [place], [place] [address]   Phone: [phone]  Fax: [phone]    May 31, 2010   Employee:  [facility] [employer]    To Whom It May Concern:   For Medical reasons, please excuse the above named employee from work for the following dates:  Start:   06/01/10  End:   06/13/10  If you need additional information, please feel free to contact our office.         Sincerely,    Sandrea Hughs, MD

## 2011-01-03 NOTE — Assessment & Plan Note (Signed)
Summary: allergy skin testing/kcw   Vital Signs:  Patient profile:   60 year old female Height:      62 inches Weight:      206.38 pounds BMI:     37.88 O2 Sat:      97 % on Room air Pulse rate:   81 / minute BP sitting:   118 / 78  (right arm) Cuff size:   large  Vitals Entered By: Reynaldo Minium CMA (August 10, 2010 2:15 PM)  O2 Flow:  Room air CC: Allergy skin testing   Primary Provider/Referring Provider:  Corwin Levins MD  CC:  Allergy skin testing.  History of Present Illness: History of Present Illness: July 28, 2010- 59 yoF followed by Dr Jonny Ruiz for primary care and referred for allergy evaluation courtesy of Dr Sherene Sires with problems of allergic rhinosinusitis, nasal polyps, asthma and bronchitis. Notes reviewed. Never smoked.  She says for most of her life she has had recurrent sinus and nasal congestion, sneezing, anosmia, nasal mucus drainage, watery eyes and nose. Throat itches and she gets wheezey. Triggers - perennial and seasonal pollens, weather changes, smoke, house dust, animals. 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 Feet itch- loratadine helps. Uses Neti pot, two times a day fluticasone, Afrin, Qvar 40, SABA,. last prednisone in July. Nasal polypectomy x 2 by Dr Annalee Genta, Denies aspirin intolerance. CT chest 12/17/09- mucus plugs and nodularity RML Allergy profile 06/20/10- numerous specific IgE elevations and very high total IgE 1697.1. CBC- WBC 8,500; Eos 2.3% CT sinus 2009- pansinusitis. Lives in clean apartment, [place], carpet, no pets or mold. Son and his wife and 3 children live there- son had lung transplant after MVA.  August 10, 2010- Allergic rhinosinusitis, nasal polyps, Asthma, Off antihistamines for skin testing. Eyes watering more than usual in rain yesterday. Feels hoarse with some residual sore throat. Left frontotemporal headache. Not blowing from nose. Yesterday sneezed a lot.  Ears ok. No recent use of proair, but says she should have used it yesterday- that passed. Going for f/u Chest CT when she leaves here. Skin test- Positive grass, weed, dust, some trees   Asthma History    Asthma Control Assessment:    Age range: 12+ years    Symptoms: 0-2 days/week    Nighttime Awakenings: 0-2/month    Interferes w/ normal activity: no limitations    SABA use (not for EIB): 0-2 days/week    Asthma Control Assessment: Well Controlled   Preventive Screening-Counseling & Management  Alcohol-Tobacco     Smoking Status: never  Current Medications (verified): 1)  Omeprazole 20 Mg Cpdr (Omeprazole) .Marland Kitchen.. 1 By Mouth Two Times A Day Before Meal 2)  Hydrochlorothiazide 25 Mg Tabs (Hydrochlorothiazide) .... One Daily 3)  Vitamin C 500 Mg Tabs (Ascorbic Acid) .... Take 1 Tablet By Mouth Once A Day 4)  Zinc 50 Mg Tabs (Zinc) .... Take 1 Tablet By Mouth Once A Day 5)  Zantac 150 Mg  Tabs (Ranitidine Hcl) .... One At Bedtime 6)  Biotin 1000 Mcg Tabs (Biotin) .... 3 Tabs By Mouth Once Daily 7)  Klor-Con M10 10 Meq Cr-Tabs (Potassium Chloride Crys Cr) .... 4 By Mouth Once Daily 8)  Fluticasone Propionate 50 Mcg/act  Susp (Fluticasone Propionate) .... 2 Sprays Each Nostril Two Times A Day 9)  Zafirlukast 20 Mg Tabs (Zafirlukast) .... One By Mouth Two Times A Day 10)  Qvar 40 Mcg/act Aers (  Beclomethasone Dipropionate) .... 2 Puffs Every 12 Hours 11)  Diltiazem Hcl Cr 240 Mg Xr24h-Cap (Diltiazem Hcl) .Marland Kitchen.. 1 By Mouth Once Daily 12)  Loratadine 10 Mg Tabs (Loratadine) .Marland Kitchen.. 1 Once Daily As Needed 13)  Mucinex Dm 30-600 Mg  Tb12 (Dextromethorphan-Guaifenesin) .Marland Kitchen.. 1-2 Tablets Every 12 Hours As Needed 14)  Proair Hfa 108 (90 Base) Mcg/act Aers (Albuterol Sulfate) .Marland Kitchen.. 1-2 Puffs Every 4-6 Hours As Needed 15)  Tramadol Hcl 50 Mg  Tabs (Tramadol Hcl) .Marland Kitchen.. 1 Every 4 Hours As Needed 16)  Sorbitol 70 % Soln (Sorbitol) .... 2 Tbsp At Bedtime As Needed 17)  Afrin Nasal Spray 0.05 % Soln  (Oxymetazoline Hcl) .... 2 Puffs Two Times A Day For 5 Days 18)  Saline Nasal Spray 0.65 % Soln (Saline) .... 2 Sprays Each Nostril As Needed 19)  Flutter Valve .... Use As Directed As Needed For Cough/gagging 20)  Prednisone 10 Mg Tabs (Prednisone) .... Take As Directeed On Med Calendar 21)  Nystatin 100000 Unit/ml Susp (Nystatin) .... 5 Cc Swish and Spit Qid For 10 Days  Allergies (verified): 1)  ! * Ivp Dye  Past History:  Past Surgical History: Last updated: 2010-08-14 Status post sinus surgery in August 2007 with Dr. Annalee Genta. Nasal polypectomy x 2 Status post foot surgery in 1997-05-02. Tubal ligation  Family History: Last updated: August 14, 2010 Mother is aged 30 - DM II, asthma, allergies, and congestive heart failure.  Father deceased at age 3 - history of epilepsy  Social History: Last updated: Aug 14, 2010 Widowed - husband died 02-May-2006 Occupation:  Museum/gallery curator at [employer] Tobacco use-never Alcohol use-no 3 children - 1 died with homicide She lives with another son and his wife- he has had second double lung transpant after complications of MVA.  Risk Factors: Smoking Status: never (08/10/2010)  Past Medical History: Hypertension GERD Allergic Rhinitis/sinusitis..........................................................Marland KitchenShoemaker (nasal polypectomy x 2)     - FESS 04/02/2009      - Allergy profile June 20, 2010 >> no eos,  IgE 1697 > refer to Dr Maple Hudson 08-14-10      - Skin Test- Pos grass, weeds, dust, some trees Asthma      - HFA 25% June 20, 2010 >   75% June 28, 2010  Obesity Depression Colonic polyps, hx of Health Maintenance.....................................................................John Complex medical regimen       - New calendar provided May 31, 2010 , June 13, 2010  Review of Systems      See HPI       The patient complains of headaches, nasal congestion/difficulty breathing through nose, and sneezing.  The patient denies shortness of breath with  activity, shortness of breath at rest, productive cough, non-productive cough, coughing up blood, chest pain, irregular heartbeats, acid heartburn, indigestion, loss of appetite, weight change, abdominal pain, difficulty swallowing, sore throat, and tooth/dental problems.    Physical Exam  Additional Exam:  General: A/Ox3; pleasant and cooperative, NAD, SKIN: no rash, lesions NODES: no lymphadenopathy HEENT: [place]/AT, EOM- WNL, Conjuctivae- clear, PERRLA, TM-WNL, Nose- audibly stuffy but not visibly obstructed, no polyps seen, Throat- clear and wnl, Mallampati  II, no cobblestone or drainage, Voice is hoarse. NECK: Supple w/ fair ROM, JVD- none, normal carotid impulses w/o bruits Thyroid- normal to palpation, no stridor CHEST: Clear to P&A, no wheeze or cough HEART: RRR, no m/g/r heard ABDOMEN: overweight ZOX:WRUE, nl pulses, no edema  NEURO: Grossly intact to observation      Impression & Recommendations:  Problem # 1:  ALLERGIC RHINITIS (ICD-477.9)  Skin  tests are strongly positive. She would be a good candidate for allergy vaccine and we discussed this in detail, including risks and alternatives. i will see her back in a month while she sees how she does on meds through the early Fall. Her abnormal CT and high IgE levels raise question of ABPA, but she was negative for IgE antibodies both by in vitro and by skin testing. IgE level is out of range for Xolair. Her updated medication list for this problem includes:    Fluticasone Propionate 50 Mcg/act Susp (Fluticasone propionate) .Marland Kitchen... 2 sprays each nostril two times a day    Loratadine 10 Mg Tabs (Loratadine) .Marland Kitchen... 1 once daily as needed    Afrin Nasal Spray 0.05 % Soln (Oxymetazoline hcl) .Marland Kitchen... 2 puffs two times a day for 5 days    Saline Nasal Spray 0.65 % Soln (Saline) .Marland Kitchen... 2 sprays each nostril as needed  Other Orders: Est. Patient Level II ([address]) Allergy Puncture Test (60[address]) Allergy I.D Test ([address])  Patient  Instructions: 1)  Please schedule a follow-up appointment in 1 month. 2)  Resume your regular medicines for now and we will watch how you do through the early Fall. I will see you then and we can talk again about allergy vaccine.

## 2011-01-03 NOTE — Progress Notes (Signed)
Summary: notes  Phone Note Call from Patient   Caller: Patient Call For: tammy p Summary of Call: [employer] [phone] Fax  -  need notes from visit w/TP on 07/11 faxed Initial call taken by: Eugene Gavia,  June 16, 2010 1:17 PM  Follow-up for Phone Call        Faxed notes.//Juanita Follow-up by: Darletta Moll,  June 17, 2010 8:06 AM

## 2011-01-03 NOTE — Progress Notes (Signed)
Summary: rx   Phone Note Call from Patient Call back at Work Phone [phone] Call back at [phone]   Caller: Patient Call For: wert Reason for Call: Talk to Nurse Summary of Call: pt not feeling well, throat hurts, nausea that comes and goes during the day.  No vomiting yet, just feel like she needs to.  Can you call something in? [facility] Initial call taken by: Eugene Gavia,  July 25, 2010 2:55 PM  Follow-up for Phone Call        Called and spoke with pt.  She is c/o sore throat- onset 2 wks ago.  She also c/o nausea for the past several days "off and on"- denies abd pain, vomiting, changes in bowels.  I offered appt for tommorrow am for HP but she refused stating that she could not get off work since already has appt with Dr Maple Hudson this wk.  Wants something to be called in.  Pls advise thanks! Follow-up by: Vernie Murders,  July 25, 2010 4:26 PM  Additional Follow-up for Phone Call Additional follow up Details #1::        we are not PCP, Dr. Jonny Ruiz is see if he can address, or be glad to see in office.   Additional Follow-up by: Rubye Oaks NP,  July 26, 2010 9:27 AM    Additional Follow-up for Phone Call Additional follow up Details #2::    ATC pt's work number ext [phone] -- no answer and unable to leave message Called the other number provided - [facility]  Gweneth Dimitri RN  July 26, 2010 9:39 AM   Pt returned call.  Infomred her these sxs are difficult to address over the phone and she should see her PCP Dr. Jonny Ruiz about this.  Pt states she already has OV with him for tomorrow am at 9:15 am and will discuss sxs with him at that time.   Follow-up by: Gweneth Dimitri RN,  July 26, 2010 10:52 AM

## 2011-01-03 NOTE — Progress Notes (Signed)
Summary: Needs ov asap with all meds  Phone Note Call from Patient Call back at Home Phone [phone]   Caller: Patient Call For: byrum Summary of Call: Pt saw Dr. Delton Coombes states she's no better c/o wheezing, congestion, gagging, constipation, says she only has 1tbs. of sorbitol left, pls advise.//[facility] Initial call taken by: Darletta Moll,  May 30, 2010 11:46 AM  Follow-up for Phone Call        Spoke with pt.  Pt states she is still "coughing and gagging"-cough is prod at times with light green mucus.  states she is also still wheezing.  Onset of sxs x 3wks ago. Informed pt she needs to come in to see MW-pt didn't understand why this was needed because she has seen 3 doctors here over the past month--advised pt why this is necessary.  She is then ok to schedule OV with MW for tomorrow at 1155am but only if in the meantime, we ask MW if she really needs to come in or can he call something in.  Will forward message to Dr. Sherene Sires. Follow-up by: Gweneth Dimitri RN,  May 30, 2010 12:15 PM  Additional Follow-up for Phone Call Additional follow up Details #1::        needs ov with all active meds from all docs plus otcs, nebs, inhalers etc in her hands at the time of ov Additional Follow-up by: Nyoka Cowden MD,  May 30, 2010 3:24 PM    Additional Follow-up for Phone Call Additional follow up Details #2::    Appt was sched for 6/28 at 11:55 Vernie Murders  May 30, 2010 3:28 PM    Appended Document: Needs ov asap with all meds Pt aware to bring all active meds with her to ov from all of her doctors-she verbalized understanding

## 2011-01-03 NOTE — Assessment & Plan Note (Signed)
Summary: allergy consult/apc   Vital Signs:  Patient profile:   60 year old female Height:      62 inches Weight:      208.50 pounds BMI:     38.27 O2 Sat:      100 % on Room air Pulse rate:   81 / minute BP sitting:   116 / 74  (right arm) Cuff size:   large  Vitals Entered By: Reynaldo Minium CMA (July 28, 2010 2:16 PM)  O2 Flow:  Room air CC: Allergy Consult-Dr. Sherene Sires   Primary Provider/Referring Provider:  Corwin Levins MD  CC:  Allergy Consult-Dr. Sherene Sires.  History of Present Illness: July 28, 2010- 60 yoF followed by Dr Jonny Ruiz for primary care and referred for allergy evaluation courtesy of Dr Sherene Sires with problems of allergic rhinosinusitis, nasal polyps, asthma and bronchitis. Notes reviewed. Never smoked.  She says for most of her life she has had recurrent sinus and nasal congestion, sneezing, anosmia, nasal mucus drainage, watery eyes and nose. Throat itches and she gets wheezey. Triggers - perennial and seasonal pollens, weather changes, smoke, house dust, animals. 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 Feet itch- loratadine helps. Uses Neti pot, two times a day fluticasone, Afrin, Qvar 40, SABA,. last prednisone in July. Nasal polypectomy x 2 by Dr Annalee Genta, Denies aspirin intolerance. CT chest 12/17/09- mucus plugs and nodularity RML Allergy profile 06/20/10- numerous specific IgE elevations and very high total IgE 1697.1. CBC- WBC 8,500; Eos 2.3% CT sinus 2009- pansinusitis. Lives in clean apartment, [place], carpet, no pets or mold. Son and his wife and 3 children live there- son had lung transplant after MVA.  Asthma History    Initial Asthma Severity Rating:    Age range: 12+ years    Symptoms: 0-2 days/week    Nighttime Awakenings: 0-2/month    Interferes w/ normal activity: minor limitations    SABA use (not for EIB): 0-2 days/week    Asthma Severity Assessment: Mild Persistent   Preventive  Screening-Counseling & Management  Alcohol-Tobacco     Smoking Status: never  Current Medications (verified): 1)  Omeprazole 20 Mg Cpdr (Omeprazole) .Marland Kitchen.. 1 By Mouth Two Times A Day Before Meal 2)  Hydrochlorothiazide 25 Mg Tabs (Hydrochlorothiazide) .... One Daily 3)  Vitamin C 500 Mg Tabs (Ascorbic Acid) .... Take 1 Tablet By Mouth Once A Day 4)  Zinc 50 Mg Tabs (Zinc) .... Take 1 Tablet By Mouth Once A Day 5)  Zantac 150 Mg  Tabs (Ranitidine Hcl) .... One At Bedtime 6)  Biotin 1000 Mcg Tabs (Biotin) .... 3 Tabs By Mouth Once Daily 7)  Klor-Con M10 10 Meq Cr-Tabs (Potassium Chloride Crys Cr) .... 4 By Mouth Once Daily 8)  Fluticasone Propionate 50 Mcg/act  Susp (Fluticasone Propionate) .... 2 Sprays Each Nostril Two Times A Day 9)  Zafirlukast 20 Mg Tabs (Zafirlukast) .... One By Mouth Two Times A Day 10)  Qvar 40 Mcg/act Aers (Beclomethasone Dipropionate) .... 2 Puffs Every 12 Hours 11)  Diltiazem Hcl Cr 240 Mg Xr24h-Cap (Diltiazem Hcl) .Marland Kitchen.. 1 By Mouth Once Daily 12)  Loratadine 10 Mg Tabs (Loratadine) .Marland Kitchen.. 1 Once Daily As Needed 13)  Mucinex Dm 30-600 Mg  Tb12 (Dextromethorphan-Guaifenesin) .Marland Kitchen.. 1-2 Tablets Every 12 Hours As Needed 14)  Proair Hfa 108 (90 Base) Mcg/act Aers (Albuterol Sulfate) .Marland Kitchen.. 1-2 Puffs Every 4-6 Hours As Needed 15)  Tramadol Hcl 50 Mg  Tabs (  Tramadol Hcl) .Marland Kitchen.. 1 Every 4 Hours As Needed 16)  Sorbitol 70 % Soln (Sorbitol) .... 2 Tbsp At Bedtime As Needed 17)  Aleve 220 Mg Tabs (Naproxen Sodium) .... Per Bottle 18)  Afrin Nasal Spray 0.05 % Soln (Oxymetazoline Hcl) .... 2 Puffs Two Times A Day For 5 Days 19)  Saline Nasal Spray 0.65 % Soln (Saline) .... 2 Sprays Each Nostril As Needed 20)  Flutter Valve .... Use As Directed As Needed For Cough/gagging 21)  Prednisone 10 Mg Tabs (Prednisone) .... Take As Directeed On Med Calendar 22)  Nystatin 100000 Unit/ml Susp (Nystatin) .... 5 Cc Swish and Spit Qid For 10 Days  Allergies (verified): 1)  ! * Ivp  Dye  Past History:  Family History: Last updated: Aug 23, 2010 Mother is aged 58 - DM II, asthma, allergies, and congestive heart failure.  Father deceased at age 47 - history of epilepsy  Social History: Last updated: 23-Aug-2010 Widowed - husband died 05-11-2006 Occupation:  Museum/gallery curator at [employer] Tobacco use-never Alcohol use-no 3 children - 1 died with homicide She lives with another son and his wife- he has had second double lung transpant after complications of MVA.  Risk Factors: Smoking Status: never (2010-08-23)  Past Medical History: Hypertension GERD Allergic Rhinitis/sinusitis..........................................................Marland KitchenShoemaker (nasal polypectomy x 2)     - FESS 04/02/2009      - Allergy profile June 20, 2010 >> no eos,  IgE 1697 > refer to Dr Maple Hudson 2010/08/23 Asthma      - HFA 25% June 20, 2010 >   75% June 28, 2010  Obesity Depression Colonic polyps, hx of Health Maintenance.....................................................................John Complex medical regimen       - New calendar provided May 31, 2010 , June 13, 2010  Past Surgical History: Status post sinus surgery in August 2007 with Dr. Annalee Genta. Nasal polypectomy x 2 Status post foot surgery in 05/11/1997. Tubal ligation  Family History: Mother is aged 36 - DM II, asthma, allergies, and congestive heart failure.  Father deceased at age 55 - history of epilepsy  Social History: Widowed - husband died 05-11-2006 Occupation:  Museum/gallery curator at [employer] Tobacco use-never Alcohol use-no 3 children - 1 died with homicide She lives with another son and his wife- he has had second double lung transpant after complications of MVA.  Review of Systems      See HPI       The patient complains of shortness of breath with activity, shortness of breath at rest, productive cough, non-productive cough, coughing up blood, chest pain, irregular heartbeats, acid heartburn, indigestion, loss of  appetite, weight change, abdominal pain, difficulty swallowing, sore throat, headaches, nasal congestion/difficulty breathing through nose, sneezing, itching, anxiety, hand/feet swelling, joint stiffness or pain, rash, and change in color of mucus.  The patient denies tooth/dental problems, ear ache, depression, and fever.    Physical Exam  Additional Exam:  General: A/Ox3; pleasant and cooperative, NAD, SKIN: no rash, lesions NODES: no lymphadenopathy HEENT: [place]/AT, EOM- WNL, Conjuctivae- clear, PERRLA, TM-WNL, Nose- audibly stuffy but not visibly obstructed, no anterior polyps, Throat- clear and wnl, Mallampati  II, no cobblestone or drainage,  NECK: Supple w/ fair ROM, JVD- none, normal carotid impulses w/o bruits Thyroid- normal to palpation, no stridor CHEST: Clear to P&A, no wheeze or cough HEART: RRR, no m/g/r heard ABDOMEN: Soft and nl; nml bowel sounds; no organomegaly or masses noted BJY:NWGN, nl pulses, no edema  NEURO: Grossly intact to observation      Impression & Recommendations:  Problem # 1:  ALLERGIC RHINITIS (ICD-477.9)  Chronic rhinosinusitis with recurrent nasal polyps. Agree with saline rinse and flurticasone. Very atopic based on allergy proile. We will bring her back off antihistamines for skin testing. Consider allergic fungal sinusitis. She is uncomfortable today and we will give depo to permit her to be off antihistamines for skin testing. Her updated medication list for this problem includes:    Fluticasone Propionate 50 Mcg/act Susp (Fluticasone propionate) .Marland Kitchen... 2 sprays each nostril two times a day    Loratadine 10 Mg Tabs (Loratadine) .Marland Kitchen... 1 once daily as needed    Afrin Nasal Spray 0.05 % Soln (Oxymetazoline hcl) .Marland Kitchen... 2 puffs two times a day for 5 days    Saline Nasal Spray 0.65 % Soln (Saline) .Marland Kitchen... 2 sprays each nostril as needed  Problem # 2:  INTRINSIC ASTHMA, UNSPECIFIED (ICD-493.10) Chronic supressive therapy is controlling her, but there is a  perennial component that seems reacti ve to environmental allergy and irritants. 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 We will reassess after skin testing. IgE is currently high for Xolair, and is in a range that, together with the mucus plugging and nodularity on CT, may indicate ABPA (and/or allergic fungal sinusitis).   Other Orders: Consultation Level IV (62952) Admin of Therapeutic Inj  intramuscular or subcutaneous (84132) Depo- Medrol 80mg  (J1040)  Patient Instructions: 1)  Return as able for allergy skin testing.- Stop all antihistamines 3 days before skin testing, including cold and allergy meds, otc sleep and cough meds. This includes loratadine. 2)  cc Dr Jonny Ruiz, Dr Sherene Sires     Medication Administration  Injection # 1:    Medication: Depo- Medrol 80mg     Diagnosis: ALLERGIC RHINITIS (ICD-477.9)    Route: SQ    Site: LUOQ gluteus    Exp Date: 03/2013    Lot #: [id number]    Mfr: Pharmacia    Patient tolerated injection without complications    Given by: Reynaldo Minium CMA (July 28, 2010 3:04 PM)  Orders Added: 1)  Consultation Level IV [44010] 2)  Admin of Therapeutic Inj  intramuscular or subcutaneous [96372] 3)  Depo- Medrol 80mg  [J1040]

## 2011-01-03 NOTE — Miscellaneous (Signed)
Summary: Skin Test/[facility]  Skin Test/[facility]   Imported By: Sherian Rein 08/16/2010 08:48:46  _____________________________________________________________________  External Attachment:    Type:   Image     Comment:   External Document

## 2011-01-03 NOTE — Assessment & Plan Note (Signed)
Summary: Pulmonary/ f/u ov with hfa 75%    Primary Provider/Referring Provider:  Corwin Levins MD  CC:  Followup.  Needs FMLA forms filled out.  Cough and wheezing have improved.  No complaints today.Andrea Mason  History of Present Illness: 60 yo AAF with known hx never smoker with previous history of difficult to control cough and wheeze attributed to rhinosinusitis and reflux seen in this office 6/08 with mostly upper airway symptoms recommended maintenance Qvar.    December 09, 2009 Followup.  Pt states that she still has the same cough since Nov 2010.  She states cough is prod with clear or white sputum.  She states that her breathing is the same- no better or worse not able to afford many of her meds and confides not followng calendar because of this.    01/03/10--Presents for follow up and med review. Doing well since last visit. Bronchitic flare tx w/ steroid burst and GERD prevention. She is unable to afford brand name PPI. New med cal provided, lost it.  Mid May 2011 started with pnds then evolved to refractory cough despite narcotic cough meds, multiple abx and prednisone from multiple providers in this office and "no better".  May 31, 2010 Acute visit.  Pt c/o wheezing and cough x 3 wks.  Cough is occ prod with white to green colored sputum.  She states that she coughs to the point of gagging.  Non compliant with med calendar (produced two old ones she's not following anyway bu not the most recent provided and totally disorganized with meds.    Having to use saba every few hours around the clock for relief of sob.--given steroid taper.   June 13, 2010 --Returns for follow up and med review. Cough is no better. Complains of wheezing, prod cough with green mucus to the point of gagging and vomiting and thrush onset 7/10 CXR 05/13/10 showed no acute finding. She got slightly better w/ steroids but cough does not go totally away. she is still out of work. rec ominicef, taper prednisone  June 20, 2010  Followup.  Pt states that cough has improved some- no more gagging.  Cough still prod with clear to light yellow sputum.  She states that she had wheezing "all night" and most of the day with exertion.  June 20, 2010 Followup.  Pt states that cough has improved some- no more gagging.  Cough still prod with clear to light yellow sputum.  She states that she had wheezing "all night" and most of the day with exertion.  still using lots of tramadol, not using hfa or nasal steroids/ afrin appropriately. .   June 28, 2010 Followup.  Needs FMLA forms filled out.  Cough and wheezing have improved.  No complaints today. maybe using tramadol once daily but prednisone 10 2 daily with plan to taper to floor of 0 as per med calendar which she appears to be following now. Pt denies any significant sore throat, nasal congestion or excess secretions, fever, chills, sweats, unintended wt loss, pleuritic or exertional cp, orthopnea pnd or leg swelling.    Current Medications (verified): 1)  Omeprazole 20 Mg Cpdr (Omeprazole) .Andrea Mason.. 1 By Mouth Two Times A Day Before Meal 2)  Hydrochlorothiazide 25 Mg Tabs (Hydrochlorothiazide) .... One Daily 3)  Vitamin C 500 Mg Tabs (Ascorbic Acid) .... Take 1 Tablet By Mouth Once A Day 4)  Zinc 50 Mg Tabs (Zinc) .... Take 1 Tablet By Mouth Once A Day 5)  Zantac  150 Mg  Tabs (Ranitidine Hcl) .... One At Bedtime 6)  Biotin 1000 Mcg Tabs (Biotin) .... 3 Tabs By Mouth Once Daily 7)  Klor-Con M10 10 Meq Cr-Tabs (Potassium Chloride Crys Cr) .... 3 By Mouth Once Daily 8)  Fluticasone Propionate 50 Mcg/act  Susp (Fluticasone Propionate) .... 2 Sprays Each Nostril Two Times A Day 9)  Zafirlukast 20 Mg Tabs (Zafirlukast) .... One By Mouth Two Times A Day 10)  Qvar 40 Mcg/act Aers (Beclomethasone Dipropionate) .... 2 Puffs Every 12 Hours 11)  Diltiazem Hcl Cr 240 Mg Xr24h-Cap (Diltiazem Hcl) .Andrea Mason.. 1 By Mouth Once Daily 12)  Loratadine 10 Mg Tabs (Loratadine) .Andrea Mason.. 1 Once Daily As Needed 13)   Mucinex Dm 30-600 Mg  Tb12 (Dextromethorphan-Guaifenesin) .Andrea Mason.. 1-2 Tablets Every 12 Hours As Needed 14)  Proair Hfa 108 (90 Base) Mcg/act Aers (Albuterol Sulfate) .Andrea Mason.. 1-2 Puffs Every 4-6 Hours As Needed 15)  Tramadol Hcl 50 Mg  Tabs (Tramadol Hcl) .Andrea Mason.. 1 Every 4 Hours As Needed 16)  Sorbitol 70 % Soln (Sorbitol) .... 2 Tbsp At Bedtime As Needed 17)  Aleve 220 Mg Tabs (Naproxen Sodium) .... Per Bottle 18)  Afrin Nasal Spray 0.05 % Soln (Oxymetazoline Hcl) .... 2 Puffs Two Times A Day For 5 Days 19)  Saline Nasal Spray 0.65 % Soln (Saline) .... 2 Sprays Each Nostril As Needed 20)  Flutter Valve .... Use As Directed As Needed For Cough/gagging 21)  Prednisone 10 Mg Tabs (Prednisone) .... Take As Directeed On Med Calendar  Allergies (verified): 1)  ! * Ivp Dye  Past History:  Past Medical History: Hypertension GERD Allergic Rhinitis/sinusitis..........................................................Andrea KitchenShoemaker     - FESS 04/02/2009      - Allergy profile June 20, 2010 >> no eos,  IgE 1697 > refer to Dr Maple Hudson 07/28/10 Asthma      - HFA 25% June 20, 2010 >   75% June 28, 2010  Obesity Depression Colonic polyps, hx of Health Maintenance.....................................................................John Complex medical regimen       - New calendar provided May 31, 2010 , June 13, 2010  Vital Signs:  Patient profile:   60 year old female Weight:      207 pounds O2 Sat:      98 % on Room air Temp:     98.6 degrees F oral Pulse rate:   76 / minute BP sitting:   112 / 68  (left arm) Cuff size:   large  Vitals Entered By: Vernie Murders (June 28, 2010 10:36 AM)  O2 Flow:  Room air  Physical Exam  Additional Exam:  ambulatory mod obese black female with a moderately nasal tone to her hoarse voice and a harsh upper airway sounding cough.  wt  223 December 09, 2009 >>220 01/03/10> 210 June 13, 2010 > 210 June 20, 2010 > 207 June 28, 2010  HEENT: nl dentition, Nasal mucosa  pale./clear discharge,  orophanx clear. . Nl external ear canals without cough reflexno thrush noted.  Neck without JVD/Nodes/TM Lungs trace end exp wheezing,predominantly upper airway pseudowheeze resolves with purse lip maneuver  RRR no s3 or murmur or increase in P2 Abd not examined Ext warm without calf tenderness, cyanosis clubbing or edema Skin warm and dry without lesions       Impression & Recommendations:  Problem # 1:  ALLERGIC RHINITIS (ICD-477.9)  Her updated medication list for this problem includes:    Fluticasone Propionate 50 Mcg/act Susp (Fluticasone propionate) .Andrea Mason... 2 sprays each nostril two times a  day    Loratadine 10 Mg Tabs (Loratadine) .Andrea Mason... 1 once daily as needed    Afrin Nasal Spray 0.05 % Soln (Oxymetazoline hcl) .Andrea Mason... 2 puffs two times a day for 5 days    Saline Nasal Spray 0.65 % Soln (Saline) .Andrea Mason... 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 needs xolair??  The goal with a chronic steroid dependent illness is always arriving at the lowest effective dose that controls the disease/symptoms and not accepting a set "formula" which is based on statistics that don't take into accound individual variability or the natural hx of the dz in every individual patient, which may well vary over time. for now ceiling is 20 mg per day and floor is 0  Orders: Est. Patient Level IV (16109)  Problem # 2:  INTRINSIC ASTHMA, UNSPECIFIED (ICD-493.10)   DDX of  difficult airways managment all start with A and  include Adherence, Ace Inhibitors, Acid Reflux, Active Sinus Disease, Alpha 1 Antitripsin deficiency, Anxiety masquerading as Airways dz,  ABPA,  allergy(esp in young), Aspiration (esp in elderly), Adverse effects of DPI,  Active smokers, plus one B  = Beta blocker use..   Allergies: see above Acid reflux: rx reviewed Adherence: better on med calendar but I spent  extra time with the patient today explaining optimal mdi  technique.  This improved from  50- 75%  er with coaching so keep working on inhaler technique  Patient Instructions: 1)  Work on inhaler technique:  relax and blow all the way out then take a nice smooth deep breath back in, triggering the inhaler at same time you start breathing in hold a few seconds then back out through nose 2)  See calendar for specific medication instructions and bring it back for each and every office visit for every healthcare provider you see.  Without it,  you may not receive the best quality medical care that we feel you deserve. 3)  Prednisone ceiling 20 mg per day and taper floor of 0  4)  Keep appt to see Dr Maple Hudson for allergy eval

## 2011-01-05 ENCOUNTER — Telehealth: Payer: Self-pay | Admitting: Internal Medicine

## 2011-01-05 ENCOUNTER — Ambulatory Visit: Admit: 2011-01-05 | Payer: Self-pay | Admitting: Internal Medicine

## 2011-01-05 ENCOUNTER — Encounter: Payer: Self-pay | Admitting: Adult Health

## 2011-01-05 NOTE — Progress Notes (Signed)
  Phone Note Call from Patient Call back at Home Phone [phone] Call back at Work Phone [phone] Call back at wk ext [phone]   Summary of Call: Pt left vm req a call back regarding last office visit. OK vm on hm #(which is her cell phone) Initial call taken by: Lamar Sprinkles, CMA,  December 01, 2010 10:41 AM  Follow-up for Phone Call        Pt called requesting Rx to treat yeast infection caused by ABX given at last OV.  [facility] Follow-up by: Margaret Pyle, CMA,  December 02, 2010 8:57 AM  Additional Follow-up for Phone Call Additional follow up Details #1::        ok for fluconozole asd  Additional Follow-up by: Corwin Levins MD,  December 02, 2010 9:19 AM    New/Updated Medications: FLUCONAZOLE 150 MG TABS (FLUCONAZOLE) 1 by mouth q 3 days as needed Prescriptions: FLUCONAZOLE 150 MG TABS (FLUCONAZOLE) 1 by mouth q 3 days as needed  #2 x 1   Entered and Authorized by:   Corwin Levins MD   Signed by:   Corwin Levins MD on 12/02/2010   Method used:   Electronically to        [facility]. [phone]* (retail)       [address]       [place], [place]  [address]       Ph: [phone]       Fax: [phone]   RxID:   [id number]

## 2011-01-05 NOTE — Assessment & Plan Note (Signed)
Summary: chest cold/john/cd   Vital Signs:  Patient profile:   60 year old female Height:      62 inches (157.48 cm) Weight:      193.50 pounds (87.95 kg) BMI:     35.52 O2 Sat:      99 % on Room air Temp:     97.2 degrees F (36.22 degrees C) oral Pulse rate:   90 / minute BP sitting:   110 / 70  (left arm) Cuff size:   large  Vitals Entered By: Brenton Grills CMA Duncan Dull) (November 11, 2010 1:09 PM)  O2 Flow:  Room air CC: Cough, congestion, body aches, fever x 1 week/aj Is Patient Diabetic? No   Primary Provider:  Corwin Levins MD  CC:  Cough, congestion, body aches, and fever x 1 week/aj.  History of Present Illness: pt states 5 days of dry-quality cough in the chest, and assoc nasal congestion.  she also reports myalgias. she slso has ? of dark urine and slight urinary urgency.  she also reports "indigestion."  she has nausea but not vomiting.  Current Medications (verified): 1)  Omeprazole 20 Mg Cpdr (Omeprazole) .Marland Kitchen.. 1 By Mouth Two Times A Day Before Meal 2)  Hydrochlorothiazide 25 Mg Tabs (Hydrochlorothiazide) .... One Daily 3)  Vitamin C 500 Mg Tabs (Ascorbic Acid) .... Take 1 Tablet By Mouth Once A Day 4)  Zinc 50 Mg Tabs (Zinc) .... Take 1 Tablet By Mouth Once A Day 5)  Zantac 150 Mg  Tabs (Ranitidine Hcl) .... One At Bedtime 6)  Biotin 1000 Mcg Tabs (Biotin) .... 3 Tabs By Mouth Once Daily 7)  Klor-Con M10 10 Meq Cr-Tabs (Potassium Chloride Crys Cr) .... 4 By Mouth Once Daily 8)  Fluticasone Propionate 50 Mcg/act  Susp (Fluticasone Propionate) .... 2 Sprays Each Nostril Two Times A Day 9)  Zafirlukast 20 Mg Tabs (Zafirlukast) .... One By Mouth Two Times A Day 10)  Qvar 40 Mcg/act Aers (Beclomethasone Dipropionate) .... 2 Puffs Every 12 Hours 11)  Diltiazem Hcl Cr 240 Mg Xr24h-Cap (Diltiazem Hcl) .Marland Kitchen.. 1 By Mouth Once Daily 12)  Loratadine 10 Mg Tabs (Loratadine) .Marland Kitchen.. 1 Once Daily As Needed 13)  Mucinex Dm 30-600 Mg  Tb12 (Dextromethorphan-Guaifenesin) .Marland Kitchen.. 1-2  Tablets Every 12 Hours As Needed 14)  Proair Hfa 108 (90 Base) Mcg/act Aers (Albuterol Sulfate) .Marland Kitchen.. 1-2 Puffs Every 4-6 Hours As Needed 15)  Tramadol Hcl 50 Mg  Tabs (Tramadol Hcl) .Marland Kitchen.. 1 Every 4 Hours As Needed 16)  Sorbitol 70 % Soln (Sorbitol) .... 2 Tbsp At Bedtime As Needed 17)  Afrin Nasal Spray 0.05 % Soln (Oxymetazoline Hcl) .... 2 Puffs Two Times A Day For 5 Days 18)  Saline Nasal Spray 0.65 % Soln (Saline) .... 2 Sprays Each Nostril As Needed 19)  Flutter Valve .... Use As Directed As Needed For Cough/gagging 20)  Avelox 400 Mg Tabs (Moxifloxacin Hcl) .Marland Kitchen.. 1 Daily X 7 Days 21)  Zithromax Z-Pak 250 Mg Tabs (Azithromycin) .... As Directed  Allergies (verified): 1)  ! * Ivp Dye  Past History:  Past Medical History: Last updated: 08/10/2010 Hypertension GERD Allergic Rhinitis/sinusitis..........................................................Marland KitchenShoemaker (nasal polypectomy x 2)     - FESS 04/02/2009      - Allergy profile June 20, 2010 >> no eos,  IgE 1697 > refer to Dr Maple Hudson 07/28/10      - Skin Test- Pos grass, weeds, dust, some trees Asthma      - HFA 25% June 20, 2010 >  75% June 28, 2010  Obesity Depression Colonic polyps, hx of Health Maintenance.....................................................................John Complex medical regimen       - New calendar provided May 31, 2010 , June 13, 2010  Review of Systems  The patient denies fever.         she has slight wheezing.    Physical Exam  General:  no distress Head:  head: no deformity eyes: no periorbital swelling, no proptosis external nose and ears are normal mouth: no lesion seen Ears:  both tm's are red and cloudy (right > left) Neck:  Supple without thyroid enlargement or tenderness. No cervical lymphadenopathy, neck masses or tracheal deviation.  Lungs:  Clear to auscultation bilaterally. Normal respiratory effort.  i do not detect wheezing   Impression & Recommendations:  Problem # 1:   ACUTE BRONCHITIS (ICD-466.0) Assessment New  Problem # 2:  dark urine she is probably a bit dry from her illness  Problem # 3:  dyspepsia prob also due to her illness  Medications Added to Medication List This Visit: 1)  Promethazine-codeine 6.25-10 Mg/76ml Syrp (Promethazine-codeine) .Marland Kitchen.. 1 teaspoon every 4 hrs as needed for cough 2)  Medrol (pak) 4 Mg Tabs (Methylprednisolone) .... As dir  Other Orders: Est. Patient Level IV (47829)  Patient Instructions: 1)  finish azithromycin "pack."   2)  take medrol "pack." 3)  continue your q-var, and take your albuterol inhaler as needed. 4)  change loratadine to loratadine-d (non-prescription) as needed for congestion, until you feel better.   5)  take promethazine-codeine cough syrup as needed for cough.   6)  drink plenty of fluids Prescriptions: MEDROL (PAK) 4 MG TABS (METHYLPREDNISOLONE) as dir  #1 pack x 0   Entered and Authorized by:   Minus Breeding MD   Signed by:   Minus Breeding MD on 11/11/2010   Method used:   Electronically to        [facility]. [phone]* (retail)       [address]       [place], [place]  [address]       Ph: [phone]       Fax: [phone]   RxID:   [id number] PROMETHAZINE-CODEINE 6.25-10 MG/5ML SYRP (PROMETHAZINE-CODEINE) 1 teaspoon every 4 hrs as needed for cough  #8 oz x 1   Entered and Authorized by:   Minus Breeding MD   Signed by:   Minus Breeding MD on 11/11/2010   Method used:   Print then Give to Patient   RxID:   [id number]    Orders Added: 1)  Est. Patient Level IV [41660]

## 2011-01-05 NOTE — Assessment & Plan Note (Signed)
Summary: Pulmonary/ ext ov with hfa teaching 25 -75%effective   Primary Provider/Referring Provider:  Corwin Levins MD  CC:  Wheezing and increased SOB and chills "all the time"..  History of Present Illness: August 10, 2010- Allergic rhinosinusitis, nasal polyps, Asthma, Off antihistamines for skin testing. Eyes watering more than usual in rain yesterday. Feels hoarse with some residual sore throat. Left frontotemporal headache. Not blowing from nose. Yesterday sneezed a lot. Ears ok. No recent use of proair, but says she should have used it yesterday- that passed. Going for f/u Chest CT when she leaves here. Skin test- Positive grass, weed, dust, some trees  August 25, 2010- Allergic rhinosinustis, nasal polyps, asthma, abnl CT (Dr Sherene Sires) Acute vist. One week ago walked 1-2 blocks from work to lunch. By the time she returned she was short of breath, tight nose and chest. Her rescue inhaler didn't work well. Went to ER [facility]. They gave prednisone to take for 5 days- off x 2 days. It did help some.  Now has left frontal pressure, postnasal drip, hoarse, scratchy throat, chest now ok ("at times feel short "). Ears ok.  I find CT report with stable tubular RML opacity, ? mucus. I have not been following her for that- sees Dr Sherene Sires.   October 28, 2010-  Allergic rhinosinustis, nasal polyps, asthma, abnl CT (Dr Sherene Sires) Nurse-CC: Acute visit-Sinus problems; stuffy-blows green in color,wheezing,cough-non productive. She felt better for a least a few weeks after last here. then again head stopped up, productive blowing nose- clear to green, right eye runs, hoarse w/o sore throat, dry cough. Feet began itching- takes loratadine for that. Began wheezing. She took loratadine, flushed nasal saline, used albuterol.  No recent fever. GI ok.   December 22, 2010 cc  Wheezing, increased SOB and chills "all the time" never better even with abx since November 11.  slt purulent sputum. nasal congestion.  Pt  denies any significant sore throat, dysphagia, itching, sneezing,  fever, chills, sweats, unintended wt loss, pleuritic or exertional cp, hempoptysis, change in activity tolerance  orthopnea pnd or leg swelling.     Current Medications (verified): 1)  Omeprazole 20 Mg Cpdr (Omeprazole) .Marland Kitchen.. 1 By Mouth Two Times A Day Before Meal 2)  Hydrochlorothiazide 25 Mg Tabs (Hydrochlorothiazide) .... One Daily 3)  Vitamin C 500 Mg Tabs (Ascorbic Acid) .... Take 1 Tablet By Mouth Once A Day 4)  Zinc 50 Mg Tabs (Zinc) .... Take 1 Tablet By Mouth Once A Day 5)  Zantac 150 Mg  Tabs (Ranitidine Hcl) .... One At Bedtime 6)  Biotin 1000 Mcg Tabs (Biotin) .... 3 Tabs By Mouth Once Daily 7)  Klor-Con M10 10 Meq Cr-Tabs (Potassium Chloride Crys Cr) .... 4 By Mouth Once Daily 8)  Fluticasone Propionate 50 Mcg/act  Susp (Fluticasone Propionate) .... 2 Sprays Each Nostril Two Times A Day 9)  Zafirlukast 20 Mg Tabs (Zafirlukast) .... One By Mouth Two Times A Day 10)  Qvar 40 Mcg/act Aers (Beclomethasone Dipropionate) .... 2 Puffs Every 12 Hours 11)  Diltiazem Hcl Cr 240 Mg Xr24h-Cap (Diltiazem Hcl) .Marland Kitchen.. 1 By Mouth Once Daily 12)  Loratadine 10 Mg Tabs (Loratadine) .Marland Kitchen.. 1 Once Daily As Needed 13)  Mucinex Dm 30-600 Mg  Tb12 (Dextromethorphan-Guaifenesin) .Marland Kitchen.. 1-2 Tablets Every 12 Hours As Needed 14)  Proair Hfa 108 (90 Base) Mcg/act Aers (Albuterol Sulfate) .Marland Kitchen.. 1-2 Puffs Every 4-6 Hours As Needed 15)  Tramadol Hcl 50 Mg  Tabs (Tramadol Hcl) .Marland Kitchen.. 1 Every 4  Hours As Needed 16)  Sorbitol 70 % Soln (Sorbitol) .... 2 Tbsp At Bedtime As Needed 17)  Afrin Nasal Spray 0.05 % Soln (Oxymetazoline Hcl) .... 2 Puffs Two Times A Day For 5 Days 18)  Saline Nasal Spray 0.65 % Soln (Saline) .... 2 Sprays Each Nostril As Needed 19)  Flutter Valve .... Use As Directed As Needed For Cough/gagging 20)  Flexeril 5 Mg Tabs (Cyclobenzaprine Hcl) .Marland Kitchen.. 1po Three Times A Day As Needed Spasm 21)  Valtrex 500 Mg Tabs (Valacyclovir Hcl)  .Marland Kitchen.. 1po Two Times A Day For 7 Days (Generic)  Allergies (verified): 1)  ! * Ivp Dye 2)  * Promethazine/codeine  Past History:  Past Medical History: Hypertension GERD Allergic Rhinitis/sinusitis..........................................................Marland KitchenShoemaker (nasal polypectomy x 2)     - FESS 04/02/2009      - Allergy profile June 20, 2010 >> no eos,  IgE 1697 > refer to Dr Maple Hudson 07/28/10      - Skin Test- Pos grass, weeds, dust, some trees Asthma      - HFA 25% June 20, 2010 >   75% June 28, 2010 > 25%  Obesity Depression Colonic polyps, hx of Health Maintenance.......................................................................John Complex medical regimen       - New calendar provided May 31, 2010 , June 13, 2010  Vital Signs:  Patient profile:   60 year old female Weight:      196 pounds O2 Sat:      95 % on Room air Temp:     97.8 degrees F oral Pulse rate:   79 / minute BP sitting:   108 / 72  (left arm) Cuff size:   large  Vitals Entered By: Vernie Murders (December 22, 2010 9:52 AM)  O2 Flow:  Room air CC: Wheezing, increased SOB and chills "all the time". "  Physical Exam  Additional Exam:  General: A/Ox3; pleasant and cooperative, NAD, SKIN: no rash, lesions NODES: no lymphadenopathy HEENT: [place]/AT, EOM- WNL, Conjuctivae- clear, PERRLA, TM-WNL, Nose- audibly stuffy but not visibly obstructed, no polyps seen, Throat- clear  Voice is hoarse but throat is not red and there is no stridor NECK: Supple w/ fair ROM, JVD- none, normal carotid impulses w/o bruits Thyroid- normal to palpation,  CHEST: Wheeze and dry cough, norhonchi HEART: RRR, no m/g/r heard ABDOMEN: overweight WJX:BJYN, nl pulses, no edema        Impression & Recommendations:  Problem # 1:  INTRINSIC ASTHMA, UNSPECIFIED (ICD-493.10)   DDX of  difficult airways managment all start with A and  include Adherence, Ace Inhibitors, Acid Reflux, Active Sinus Disease, Alpha 1 Antitripsin  deficiency, Anxiety masquerading as Airways dz,  ABPA,  allergy(esp in young), Aspiration (esp in elderly), Adverse effects of DPI,  Active smokers, plus one B  = Beta blocker use.. and one C = CHF  In this case Adherence is the biggest issue and starts with  inability to use HFA effectively and also  understand that SABA treats the symptoms but doesn't get to the underlying problem (inflammation).  I used  the ananology of putting steroid cream on a rash to help explain the meaning of topical therapy and the need to get the drug to the target tissue.  I spent extra time with the patient today explaining optimal mdi  technique.  This improved from  25-75%  Active sinus ?  rx augmentin then sinus ct if not better  Use trust but verify approach here re med rx.  To keep things simple, I  have asked the patient to first separate medicines that are perceived as maintenance, that is to be taken daily "no matter what", from those medicines that are taken on only on an as-needed basis and I have given the patient examples of both, and then return to see our NP to generate a  detailed  medication calendar which should be followed until the next physician sees the patient and updates it.   Once we're sure that we're all reading from the same page in terms of medication admiistration, she needs to be scheduled to follow up with me   Medications Added to Medication List This Visit: 1)  Prednisone 10 Mg Tabs (Prednisone) .... Take as directed on instruction sheet 2)  Augmentin 875-125 Mg Tabs (Amoxicillin-pot clavulanate) .... By mouth twice daily  Other Orders: Est. Patient Level IV (16109)  Patient Instructions: 1)  I emphasized to the patient that just like Dorothy in [employer], she is already wearing "ruby slippers" she can click anytime she wants:  the answer to all her recurrent symptoms  is already  literally at her fingertips:   all she has to do is refer to the medicine calendar we provided her   and her problems  are each addressed in a user-friendly format, reading from left to right for each symptoms she's likely to develop between office visits.   2)  Take augmentin twice daily and yogurt for lunch  3)  Prednisone as per calendar:  2 daily until better then taper off 4)  See [name] w/in 2 weeks with all your medications, even over the counter meds, separated in two separate bags, the ones you take no matter what vs the ones you stop once you feel better and take only as needed.  She will generate for you a new user friendly medication calendar that will put [place] all on the same page re: your medication use.  She will order a sinus ct if not better Prescriptions: AUGMENTIN 875-125 MG  TABS (AMOXICILLIN-POT CLAVULANATE) By mouth twice daily  #20 x 0   Entered and Authorized by:   Nyoka Cowden MD   Signed by:   Nyoka Cowden MD on 12/22/2010   Method used:   Electronically to        [facility]. [phone]* (retail)       [address]       [place], [place]  [address]       Ph: [phone]       Fax: [phone]   RxID:   [id number] PREDNISONE 10 MG  TABS (PREDNISONE) take as directed on instruction sheet  #50 x 0   Entered and Authorized by:   Nyoka Cowden MD   Signed by:   Nyoka Cowden MD on 12/22/2010   Method used:   Electronically to        [facility]. [phone]* (retail)       [address]       [place], [place]  [address]       Ph: [phone]       Fax: [phone]   RxID:   [id number]
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
## 2011-01-05 NOTE — Progress Notes (Signed)
Summary: sob  Phone Note Call from Patient Call back at [phone] ext [phone]   Caller: Patient Call For: young Summary of Call: Pt c/o sob, runny nose, runny eyes, since last month says she has been using her albuterol inhaler but it's not working pls advise, can also be reached on her cell at [phone].// [facility] Initial call taken by: Darletta Moll,  December 21, 2010 3:48 PM  Follow-up for Phone Call        Spoke with pt. She is c/o increased SOB and wheezing, she requests appt.  Sched her to see MW tommorrow am at 9:45 am.  ED sooner if needed. Follow-up by: Vernie Murders,  December 21, 2010 4:06 PM

## 2011-01-05 NOTE — Progress Notes (Signed)
Summary: UA  Phone Note Call from Patient Call back at Home Phone [phone]   Caller: Patient Summary of Call: Pt called stating she has been having episodes of urinary incontince and dark malodorous urine. Pt is concerned and is requesting UA, please advise. Initial call taken by: Margaret Pyle, CMA,  November 14, 2010 4:06 PM  Follow-up for Phone Call        ok to see any MD tomorrow with UA prior  - 588.41 Follow-up by: Corwin Levins MD,  November 14, 2010 6:05 PM  Additional Follow-up for Phone Call Additional follow up Details #1::        Pt advised and scheduled with JWJ this afternoon, labs entered. Additional Follow-up by: Margaret Pyle, CMA,  November 15, 2010 8:49 AM

## 2011-01-06 ENCOUNTER — Other Ambulatory Visit: Payer: Self-pay | Admitting: Internal Medicine

## 2011-01-06 ENCOUNTER — Ambulatory Visit (INDEPENDENT_AMBULATORY_CARE_PROVIDER_SITE_OTHER): Payer: Managed Care, Other (non HMO) | Admitting: Internal Medicine

## 2011-01-06 ENCOUNTER — Encounter: Payer: Self-pay | Admitting: Internal Medicine

## 2011-01-06 DIAGNOSIS — J45909 Unspecified asthma, uncomplicated: Secondary | ICD-10-CM

## 2011-01-06 DIAGNOSIS — J329 Chronic sinusitis, unspecified: Secondary | ICD-10-CM

## 2011-01-09 ENCOUNTER — Other Ambulatory Visit: Payer: Managed Care, Other (non HMO)

## 2011-01-11 ENCOUNTER — Encounter (INDEPENDENT_AMBULATORY_CARE_PROVIDER_SITE_OTHER): Payer: Managed Care, Other (non HMO) | Admitting: Adult Health

## 2011-01-11 ENCOUNTER — Encounter: Payer: Self-pay | Admitting: Adult Health

## 2011-01-11 DIAGNOSIS — J209 Acute bronchitis, unspecified: Secondary | ICD-10-CM

## 2011-01-11 NOTE — Assessment & Plan Note (Signed)
Summary: Pulmonar/ ext ov with hfa 75%   Vital Signs:  Patient profile:   60 year old female Height:      62 inches Weight:      196 pounds O2 Sat:      96 % on Room air Temp:     98.2 degrees F oral Pulse rate:   93 / minute BP sitting:   118 / 80  (left arm) Cuff size:   large  Vitals Entered By: Vernie Murders (January 06, 2011 11:57 AM)  O2 Flow:  Room air CC: PND and cough   Primary Provider/Referring Provider:  Corwin Levins MD  CC:  PND and cough.  History of Present Illness: 25 yobf never smoker with predominant features of rhinitis >> asthma with ? component vcd  August 10, 2010- Allergic rhinosinusitis, nasal polyps, Asthma, Off antihistamines for skin testing. Eyes watering more than usual in rain yesterday. Feels hoarse with some residual sore throat. Left frontotemporal headache. Not blowing from nose. Yesterday sneezed a lot. Ears ok. No recent use of proair, but says she should have used it yesterday- that passed. Going for f/u Chest CT when she leaves here. Skin test- Positive grass, weed, dust, some trees  August 25, 2010- Allergic rhinosinustis, nasal polyps, asthma, abnl CT (Dr Sherene Sires) Acute vist. One week ago walked 1-2 blocks from work to lunch. By the time she returned she was short of breath, tight nose and chest. Her rescue inhaler didn't work well. Went to ER [facility]. They gave prednisone to take for 5 days- off x 2 days. It did help some.  Now has left frontal pressure, postnasal drip, hoarse, scratchy throat, chest now ok ("at times feel short "). Ears ok.  I find CT report with stable tubular RML opacity, ? mucus. I have not been following her for that- sees Dr Sherene Sires.   October 28, 2010-  Allergic rhinosinustis, nasal polyps, asthma, abnl CT (Dr Sherene Sires) Nurse-CC: Acute visit-Sinus problems; stuffy-blows green in color,wheezing,cough-non productive. She felt better for a least a few weeks after last here. then again head stopped up, productive blowing  nose- clear to green, right eye runs, hoarse w/o sore throat, dry cough. Feet began itching- takes loratadine for that. Began wheezing. She took loratadine, flushed nasal saline, used albuterol.     December 22, 2010 cc  Wheezing, increased SOB and chills "all the time" never better even with abx since November 11.  slt purulent sputum. nasal congestion.  rec .  Take augmentin twice daily and yogurt for lunch  Prednisone as per calendar:  2 daily until better then taper off  see page 2 January 06, 2011 ov maybe 50% better p augmentin and prednsione 20 and tapered down to last prednisone 1/2 and much worse breathing since tapered but mostly due to nasal obstr symptoms.  mucus is minimal and much lighter green - some sinus pain sporadicallly and fleeting pain various places over face, not worse in am's and no attacks of breathing difficulty while sleeping.  Pt denies any significant sore throat, dysphagia, itching, sneezing,  nasal congestion or excess secretions,  fever, chills, sweats, unintended wt loss, pleuritic or exertional cp, hempoptysis,    orthopnea pnd or leg swelling. Pt also denies any obvious fluctuation in symptoms with weather or environmental change or other alleviating or aggravating factors.       Current Medications (verified): 1)  Omeprazole 20 Mg Cpdr (Omeprazole) .Marland Kitchen.. 1 By Mouth Two Times A Day  Before Meal 2)  Hydrochlorothiazide 25 Mg Tabs (Hydrochlorothiazide) .... One Daily 3)  Vitamin C 500 Mg Tabs (Ascorbic Acid) .... Take 1 Tablet By Mouth Once A Day 4)  Zinc 50 Mg Tabs (Zinc) .... Take 1 Tablet By Mouth Once A Day 5)  Zantac 150 Mg  Tabs (Ranitidine Hcl) .... One At Bedtime 6)  Biotin 1000 Mcg Tabs (Biotin) .... 3 Tabs By Mouth Once Daily 7)  Klor-Con M10 10 Meq Cr-Tabs (Potassium Chloride Crys Cr) .... 4 By Mouth Once Daily 8)  Fluticasone Propionate 50 Mcg/act  Susp (Fluticasone Propionate) .... 2 Sprays Each Nostril Two Times A Day 9)  Zafirlukast 20 Mg Tabs  (Zafirlukast) .... One By Mouth Two Times A Day 10)  Qvar 40 Mcg/act Aers (Beclomethasone Dipropionate) .... 2 Puffs Every 12 Hours 11)  Diltiazem Hcl Cr 240 Mg Xr24h-Cap (Diltiazem Hcl) .Marland Kitchen.. 1 By Mouth Once Daily 12)  Loratadine 10 Mg Tabs (Loratadine) .Marland Kitchen.. 1 Once Daily As Needed 13)  Mucinex Dm 30-600 Mg  Tb12 (Dextromethorphan-Guaifenesin) .Marland Kitchen.. 1-2 Tablets Every 12 Hours As Needed 14)  Proair Hfa 108 (90 Base) Mcg/act Aers (Albuterol Sulfate) .Marland Kitchen.. 1-2 Puffs Every 4-6 Hours As Needed 15)  Tramadol Hcl 50 Mg  Tabs (Tramadol Hcl) .Marland Kitchen.. 1 Every 4 Hours As Needed 16)  Sorbitol 70 % Soln (Sorbitol) .... 2 Tbsp At Bedtime As Needed 17)  Afrin Nasal Spray 0.05 % Soln (Oxymetazoline Hcl) .... 2 Puffs Two Times A Day For 5 Days 18)  Saline Nasal Spray 0.65 % Soln (Saline) .... 2 Sprays Each Nostril As Needed 19)  Flutter Valve .... Use As Directed As Needed For Cough/gagging 20)  Flexeril 5 Mg Tabs (Cyclobenzaprine Hcl) .Marland Kitchen.. 1po Three Times A Day As Needed Spasm 21)  Valtrex 500 Mg Tabs (Valacyclovir Hcl) .Marland Kitchen.. 1po Two Times A Day For 7 Days (Generic) 22)  Prednisone 10 Mg  Tabs (Prednisone) .... Take As Directed On Instruction Sheet  Allergies (verified): 1)  ! * Ivp Dye 2)  * Promethazine/codeine  Past History:  Past Medical History: Hypertension GERD Allergic Rhinitis/sinusitis..........................................................Marland KitchenShoemaker (nasal polypectomy x 2)     - FESS 04/02/2009      - Allergy profile June 20, 2010 >> no eos,  IgE 1697 > refer to Dr Maple Hudson 07/28/10      - Skin Test- Pos grass, weeds, dust, some trees      - Re CT Sinuses ordered January 06, 2011 >>> Asthma      - HFA 25% June 20, 2010 >   75% June 28, 2010 > 75% January 06, 2011  Obesity Depression Colonic polyps, hx of Health Maintenance.......................................................................John Complex medical regimen       - New calendar provided May 31, 2010 , June 13, 2010  Physical  Exam  Additional Exam:  wt 196 January 06, 2011 hoarse amb bf nad HEENT: nl dentition, turbinates, and orophanx. Nl external ear canals without cough reflex NECK :  without JVD/Nodes/TM/ nl carotid upstrokes bilaterally LUNGS: no acc muscle use, clear to A and P bilaterally without cough on insp or exp maneuvers CV:  RRR  no s3 or murmur or increase in P2, no edema  ABD:  soft and nontender with nl excursion in the supine position. No bruits or organomegaly, bowel sounds nl MS:  warm without deformities, calf tenderness, cyanosis or clubbing SKIN: warm and dry without lesions   NEURO:  alert, approp, no deficits        Impression & Recommendations:  Problem # 1:  INTRINSIC ASTHMA, UNSPECIFIED (ICD-493.10) Symptoms are markedly disproportionate to findings    DDX of  difficult airways managment all start with A and  include Adherence, Ace Inhibitors, Acid Reflux, Active Sinus Disease, Alpha 1 Antitripsin deficiency, Anxiety masquerading as Airways dz,  ABPA,  allergy(esp in young), Aspiration (esp in elderly), Adverse effects of DPI,  Active smokers, plus two Bs  = Bronchiectasis and Beta blocker use..and one C= CHF    Adherence seems better with use of med calendar but need to use a trust but verify approach here.  I spent extra time with the patient today explaining optimal mdi  technique.  This improved from  50-75% p coaching  Active sinus dz? > sinus ct next  ? Allergy:  defer to Dr Maple Hudson  Other Orders: Misc. Referral (Misc. Ref) Est. Patient Level IV (16109)  Patient Instructions: 1)  Keep appt to see  [name]  with all your medications, even over the counter meds, separated in two separate bags, the ones you take no matter what vs the ones you stop once you feel better and take only as needed.  She will generate for you a new user friendly medication calendar that will put [place] all on the same page re: your medication use.   2)  Change mucinex dm to mucinex d when the  nasal congestion is bad 3)  Prednisone dose is 20 mg per day until better, then taper off as per calendar 4)  See Patient Care Coordinator before leaving for sinus ct   Orders Added: 1)  Misc. Referral [Misc. Ref] 2)  Est. Patient Level IV [60454]

## 2011-01-11 NOTE — Progress Notes (Signed)
Summary: Taper prednisone off  Phone Note Call from Patient   Caller: Patient Call For: Artemisa Sladek Summary of Call: pt needs clarification re: instructions for prednisone taper. call work # [phone] x [phone] Initial call taken by: Tivis Ringer, CNA,  January 03, 2011 1:12 PM  Follow-up for Phone Call        Called pt at the number provided and LM with coworker TCB Vernie Murders  January 03, 2011 3:06 PM  Spoke with pt.  She states that she feels some better since last seen so is ready to start tapering off pred but she states that she can not read MWs writing on her med list. She states that she took 2 once daily for 5 days, then took 1 once daily for the past 3 days.  I am unsure what to tell her since I don't have her med cal to look at. Pls advise thanks Follow-up by: Vernie Murders,  January 03, 2011 4:05 PM  Additional Follow-up for Phone Call Additional follow up Details #1::        it says taper off so that's 1 daily x 3 days then one half daily x 3 days and stop Additional Follow-up by: Nyoka Cowden MD,  January 03, 2011 4:09 PM    Additional Follow-up for Phone Call Additional follow up Details #2::    pt advsied of recs and appt fro med calender r/s to 01-11-11 at 2pm per pt requests. pt aware.Carron Curie CMA  January 03, 2011 4:20 PM

## 2011-01-11 NOTE — Progress Notes (Signed)
Summary: just finished antibiotic and still congested  Phone Note Call from Patient   Caller: Patient Call For: Lucilla Petrenko Summary of Call: Patient phoned she just finished a round of antibiotics and she is so congested that she cant breath. she has an appointment next week but she cant make it until then. She is at work and can not leave she is afraid she will get fired if she takes time off of work. she can be reached at [phone] Initial call taken by: Vedia Coffer,  January 05, 2011 10:19 AM  Follow-up for Phone Call        called and spoke with pt. pt recently saw MW on 12/22/2010.  Pt states she finished abx and took last dose of pred taper today.  States she is still "very congested" in her head  and states it's very difficult to breathe d/t the congestion.  States she will blow light green drainage from her nose.  Also c/o facial pressure, chills and sweats (unable to check temp) and non-productive cough.  Pt is currently taking Mucinex DM Max with no relief of symptoms.  Pt states she had to cancel her appt today with TP for a med calendar d/t her work and already having so many days off d/t dr appts.  She rescheduled for next week... 01/11/2011.  Pt requesting MW's recs. Please advise.  Thanks.  Arman Filter, LPN January 05, 2011 10:51 AM  nothing to offer over the phone, ov asap with all meds in hand separated maint vs prn Follow-up by: Nyoka Cowden MD,  January 05, 2011 11:28 AM  Additional Follow-up for Phone Call Additional follow up Details #1::        pt set to see MW tomorrow at 11:30. Carron Curie CMA  January 05, 2011 12:04 PM

## 2011-01-13 ENCOUNTER — Ambulatory Visit (INDEPENDENT_AMBULATORY_CARE_PROVIDER_SITE_OTHER)
Admission: RE | Admit: 2011-01-13 | Discharge: 2011-01-13 | Disposition: A | Discharge status: Home / Self Care | Payer: Managed Care, Other (non HMO) | Source: Ambulatory Visit | Attending: Internal Medicine | Admitting: Internal Medicine

## 2011-01-13 DIAGNOSIS — J339 Nasal polyp, unspecified: Secondary | ICD-10-CM

## 2011-01-13 DIAGNOSIS — J329 Chronic sinusitis, unspecified: Secondary | ICD-10-CM

## 2011-01-19 ENCOUNTER — Telehealth: Payer: Self-pay | Admitting: Internal Medicine

## 2011-01-19 NOTE — Assessment & Plan Note (Signed)
Summary: NP follow up - med calendar      Allergies Added:  Nurse Visit   Vital Signs:  Patient profile:   60 year old female Height:      62 inches Weight:      201 pounds BMI:     36.90 O2 Sat:      97 % on Room air Temp:     97.8 degrees F oral Pulse rate:   88 / minute BP sitting:   130 / 82  (left arm) Cuff size:   large  Vitals Entered By: Boone Master CNA/MA (January 11, 2011 2:18 PM)  O2 Flow:  Room air  Primary Provider/Referring Provider:  Corwin Levins MD  CC:  est med calendar - pt brought all meds with her today.  no new complaints and feels that her breathing has improved slightly since last ov.  CT sinsu is scheduled for 01-13-11 @ 4pm..  History of Present Illness: 5  yobf never smoker with predominant features of rhinitis >> asthma with ? component vcd  August 10, 2010- Allergic rhinosinusitis, nasal polyps, Asthma, Off antihistamines for skin testing. Eyes watering more than usual in rain yesterday. Feels hoarse with some residual sore throat. Left frontotemporal headache. Not blowing from nose. Yesterday sneezed a lot. Ears ok. No recent use of proair, but says she should have used it yesterday- that passed. Going for f/u Chest CT when she leaves here. Skin test- Positive grass, weed, dust, some trees  August 25, 2010- Allergic rhinosinustis, nasal polyps, asthma, abnl CT (Dr Sherene Sires) Acute vist. One week ago walked 1-2 blocks from work to lunch. By the time she returned she was short of breath, tight nose and chest. Her rescue inhaler didn't work well. Went to ER [facility]. They gave prednisone to take for 5 days- off x 2 days. It did help some.  Now has left frontal pressure, postnasal drip, hoarse, scratchy throat, chest now ok ("at times feel short "). Ears ok.  I find CT report with stable tubular RML opacity, ? mucus. I have not been following her for that- sees Dr Sherene Sires.   October 28, 2010-  Allergic rhinosinustis, nasal polyps, asthma, abnl CT (Dr  Sherene Sires) Nurse-CC: Acute visit-Sinus problems; stuffy-blows green in color,wheezing,cough-non productive. She felt better for a least a few weeks after last here. then again head stopped up, productive blowing nose- clear to green, right eye runs, hoarse w/o sore throat, dry cough. Feet began itching- takes loratadine for that. Began wheezing. She took loratadine, flushed nasal saline, used albuterol.     December 22, 2010 cc  Wheezing, increased SOB and chills "all the time" never better even with abx since November 11.  slt purulent sputum. nasal congestion.  rec .  Take augmentin twice daily and yogurt for lunch  Prednisone as per calendar:  2 daily until better then taper off  see page 2  January 06, 2011 ov maybe 50% better p augmentin and prednsione 20 and tapered down to last prednisone 1/2 and much worse breathing since tapered but mostly due to nasal obstr symptoms.  mucus is minimal and much lighter green - some sinus pain sporadicallly and fleeting pain various places over face, not worse in am's and no attacks of breathing difficulty while sleeping.   >>prednsione burst and mucolytics  January 11, 2011 --Retrurns for follow up and med review.Last vist with slow to resolve bronchtic flare w/ ? sinusitis .She was gvien a steroid burst  and is now improving . She has upcoming CT sinus on 12/13/10. She is feeling better wtih decreased cough and congestion. Still has drainge. Is on Prednisone 20mg  now with plans to wean off over the next 10 days as directed. We reviewed her meds and updated her med calendar. Denies chest pain, orthopnea, hemoptysis, fever, n/v/d, edema, headache.   Patient Instructions: 1)  Add Zyrtec 10mg  at bedtime  2)  Follow med calendar closely and bring to each visit.  3)  Taper prednisone per calendar instructions.  4)  follow up Dr. Sherene Sires in 6 weeks and as needed  5)  Please contact office for sooner follow up if symptoms do not improve or worsen    Past  History:  Past Surgical History: Last updated: 07/28/2010 Status post sinus surgery in August 2007 with Dr. Annalee Genta. Nasal polypectomy x 2 Status post foot surgery in 1998. Tubal ligation  Past Medical History: Hypertension GERD Allergic Rhinitis/sinusitis..........................................................Marland KitchenShoemaker (nasal polypectomy x 2)     - FESS 04/02/2009      - Allergy profile June 20, 2010 >> no eos,  IgE 1697 > refer to Dr Maple Hudson 07/28/10      - Skin Test- Pos grass, weeds, dust, some trees      - Re CT Sinuses ordered January 06, 2011 >>> Asthma      - HFA 25% June 20, 2010 >   75% June 28, 2010 > 75% January 06, 2011  Obesity Depression Colonic polyps, hx of Health Maintenance.......................................................................[facility] medical regimen       - New calendar provided May 31, 2010 , June 13, 2010, January 11, 2011    Review of Systems      See HPI   Physical Exam  Additional Exam:  hoarse amb bf nad HEENT: nl dentition, turbinates, and orophanx. Nl external ear canals without cough reflex NECK :  without JVD/Nodes/TM/ nl carotid upstrokes bilaterally LUNGS: no acc muscle use, clear to A and P bilaterally without cough on insp or exp maneuvers CV:  RRR  no s3 or murmur or increase in P2, no edema  ABD:  soft and nontender with nl excursion in the supine position. No bruits or organomegaly, bowel sounds nl MS:  warm without deformities, calf tenderness, cyanosis or clubbing SKIN: warm and dry without lesions   NEURO:  alert, approp, no deficits        Impression & Recommendations:  Problem # 1:  ACUTE BRONCHITIS (ICD-466.0)  Slow to resolve flare with ? sinusitis slow to [employer] reviewed with pt education and computerized med calendar completed/adjusted.   CT sinus pending.  Plan Add Zyrtec 10mg  at bedtime  Follow med calendar closely and bring to each visit.  Taper prednisone per calendar  instructions.  follow up Dr. Sherene Sires in 6 weeks and as needed  Please contact office for sooner follow up if symptoms do not improve or worsen  Her updated medication list for this problem includes:    Zafirlukast 20 Mg Tabs (Zafirlukast) ..... One by mouth two times a day    Qvar 40 Mcg/act Aers (Beclomethasone dipropionate) .Marland Kitchen... 2 puffs every 12 hours    Mucinex Dm 30-600 Mg Tb12 (Dextromethorphan-guaifenesin) .Marland Kitchen... 1-2 tablets every 12 hours as needed    Proair Hfa 108 (90 Base) Mcg/act Aers (Albuterol sulfate) .Marland Kitchen... 1-2 puffs every 4-6 hours as needed  Orders: Est. Patient Level III ([address]109)  CC: est med calendar - pt brought all meds with her today.  no new complaints, feels that her breathing has improved slightly since last ov.  CT sinsu is scheduled for 01-13-11 @ 4pm. Is Patient Diabetic? No Comments Medications reviewed with patient Daytime contact number verified with patient. Boone Master CNA/MA  January 11, 2011 2:26 PM    Medications Prior to Update: 1)  Omeprazole 20 Mg Cpdr (Omeprazole) .Marland Kitchen.. 1 By Mouth Two Times A Day Before Meal 2)  Hydrochlorothiazide 25 Mg Tabs (Hydrochlorothiazide) .... One Daily 3)  Vitamin C 500 Mg Tabs (Ascorbic Acid) .... Take 1 Tablet By Mouth Once A Day 4)  Zinc 50 Mg Tabs (Zinc) .... Take 1 Tablet By Mouth Once A Day 5)  Zantac 150 Mg  Tabs (Ranitidine Hcl) .... One At Bedtime 6)  Biotin 1000 Mcg Tabs (Biotin) .... 3 Tabs By Mouth Once Daily 7)  Klor-Con M10 10 Meq Cr-Tabs (Potassium Chloride Crys Cr) .... 4 By Mouth Once Daily 8)  Fluticasone Propionate 50 Mcg/act  Susp (Fluticasone Propionate) .... 2 Sprays Each Nostril Two Times A Day 9)  Zafirlukast 20 Mg Tabs (Zafirlukast) .... One By Mouth Two Times A Day 10)  Qvar 40 Mcg/act Aers (Beclomethasone Dipropionate) .... 2 Puffs Every 12 Hours 11)  Diltiazem Hcl Cr 240 Mg Xr24h-Cap (Diltiazem Hcl) .Marland Kitchen.. 1 By Mouth Once Daily 12)  Loratadine 10 Mg Tabs (Loratadine) .Marland Kitchen.. 1 Once Daily As  Needed 13)  Mucinex Dm 30-600 Mg  Tb12 (Dextromethorphan-Guaifenesin) .Marland Kitchen.. 1-2 Tablets Every 12 Hours As Needed 14)  Proair Hfa 108 (90 Base) Mcg/act Aers (Albuterol Sulfate) .Marland Kitchen.. 1-2 Puffs Every 4-6 Hours As Needed 15)  Tramadol Hcl 50 Mg  Tabs (Tramadol Hcl) .Marland Kitchen.. 1 Every 4 Hours As Needed 16)  Sorbitol 70 % Soln (Sorbitol) .... 2 Tbsp At Bedtime As Needed 17)  Afrin Nasal Spray 0.05 % Soln (Oxymetazoline Hcl) .... 2 Puffs Two Times A Day For 5 Days 18)  Saline Nasal Spray 0.65 % Soln (Saline) .... 2 Sprays Each Nostril As Needed 19)  Flutter Valve .... Use As Directed As Needed For Cough/gagging 20)  Flexeril 5 Mg Tabs (Cyclobenzaprine Hcl) .Marland Kitchen.. 1po Three Times A Day As Needed Spasm 21)  Valtrex 500 Mg Tabs (Valacyclovir Hcl) .Marland Kitchen.. 1po Two Times A Day For 7 Days (Generic) 22)  Prednisone 10 Mg  Tabs (Prednisone) .... Take As Directed On Instruction Sheet  Allergies (verified): 1)  ! * Ivp Dye 2)  * Promethazine/codeine  Orders Added: 1)  Est. Patient Level III [57846]  Appended Document: med calendar update Medications Added ZYRTEC ALLERGY 10 MG TABS (CETIRIZINE HCL) Take 1 tab by mouth at bedtime BENADRYL 25 MG CAPS (DIPHENHYDRAMINE HCL) Take 1 capsule by mouth at bedtime as needed for drippy nose and itching MUCINEX D 60-600 MG XR12H-TAB (PSEUDOEPHEDRINE-GUAIFENESIN) 1-2 two times a day as needed for congestion and sinus pressure PREDNISONE 10 MG  TABS (PREDNISONE) 2 tabs until better, 1 tab x5days, 1/2 tab x5days and stop          Clinical Lists Changes  Medications: Changed medication from LORATADINE 10 MG TABS (LORATADINE) 1 once daily as needed to ZYRTEC ALLERGY 10 MG TABS (CETIRIZINE HCL) Take 1 tab by mouth at bedtime Added new medication of BENADRYL 25 MG CAPS (DIPHENHYDRAMINE HCL) Take 1 capsule by mouth at bedtime as needed for drippy nose and itching Changed medication from MUCINEX DM 30-600 MG  TB12 (DEXTROMETHORPHAN-GUAIFENESIN) 1-2 tablets every 12 hours as needed  to MUCINEX D 60-600 MG XR12H-TAB (PSEUDOEPHEDRINE-GUAIFENESIN) 1-2 two times a day  as needed for congestion and sinus pressure Changed medication from PREDNISONE 10 MG  TABS (PREDNISONE) take as directed on instruction sheet to PREDNISONE 10 MG  TABS (PREDNISONE) 2 tabs until better, 1 tab x5days, 1/2 tab x5days and stop Removed medication of FLEXERIL 5 MG TABS (CYCLOBENZAPRINE HCL) 1po three times a day as needed spasm Removed medication of VALTREX 500 MG TABS (VALACYCLOVIR HCL) 1po two times a day for 7 days (generic)

## 2011-01-25 ENCOUNTER — Telehealth (INDEPENDENT_AMBULATORY_CARE_PROVIDER_SITE_OTHER): Payer: Self-pay | Admitting: *Deleted

## 2011-01-26 ENCOUNTER — Encounter: Payer: Self-pay | Admitting: Internal Medicine

## 2011-01-26 ENCOUNTER — Ambulatory Visit (INDEPENDENT_AMBULATORY_CARE_PROVIDER_SITE_OTHER): Payer: Managed Care, Other (non HMO) | Admitting: Internal Medicine

## 2011-01-26 DIAGNOSIS — G4730 Sleep apnea, unspecified: Secondary | ICD-10-CM | POA: Insufficient documentation

## 2011-01-26 DIAGNOSIS — G4710 Hypersomnia, unspecified: Secondary | ICD-10-CM | POA: Insufficient documentation

## 2011-01-26 DIAGNOSIS — J309 Allergic rhinitis, unspecified: Secondary | ICD-10-CM

## 2011-01-26 DIAGNOSIS — J328 Other chronic sinusitis: Secondary | ICD-10-CM

## 2011-01-28 ENCOUNTER — Encounter: Payer: Self-pay | Admitting: Internal Medicine

## 2011-01-30 ENCOUNTER — Ambulatory Visit (INDEPENDENT_AMBULATORY_CARE_PROVIDER_SITE_OTHER): Payer: Managed Care, Other (non HMO)

## 2011-01-30 ENCOUNTER — Telehealth (INDEPENDENT_AMBULATORY_CARE_PROVIDER_SITE_OTHER): Payer: Self-pay | Admitting: *Deleted

## 2011-01-30 DIAGNOSIS — J301 Allergic rhinitis due to pollen: Secondary | ICD-10-CM

## 2011-01-31 NOTE — Progress Notes (Signed)
Summary: inform patient of allergy info  ---- Converted from flag ---- ---- 01/09/2011 1:08 PM, Waymon Budge MD wrote: Florentina Addison- Please let her know Dr Sherene Sires would like her to try allergy vaccine based on her previous allergy teting here. Please work with her to get her an appointment with me so we can discuss it. Thanks.   ---- 01/09/2011 1:06 PM, Waymon Budge MD wrote: Rip Harbour- I will have Katie bring her in to discuss recommendation to try allrgy vaccine.  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 ------------------------------       Additional Follow-up for Phone Call Additional follow up Details #2::    Left message with co-worker for pt to call me back.Reynaldo Minium CMA  January 23, 2011 3:16 PM     Spoke with patient-she made appt Thursday at [phone] to discuss allergy injections with CDY per MW.Katie [facility] CMA  January 23, 2011 3:27 PM

## 2011-01-31 NOTE — Progress Notes (Signed)
Summary: ? needs new ent referral > defer to Dr Maple Hudson @  2/23 ov  Phone Note Call from Patient   Caller: Patient Call For: WERT Summary of Call: Patient phoned stated that leslie told her to call Dr Kelli Churn ENT and she wanted to let Verlon Au know what they told her. she can be reached at [phone] or her cell at  [phone] Initial call taken by: Vedia Coffer,  January 25, 2011 1:53 PM  Follow-up for Phone Call        I had told her to followup with ENT after her CT sinus came back worse.  Spoke with her today and she states that she was unable to sched appt due to payment issues.  She states that her symptoms are really no better- rhinitis, sinus pressure, watery eyes.  Wants to know if MW wants to give abx.  She also states has appt with Dr Maple Hudson tommorrow.  Pls advise thanks Follow-up by: Vernie Murders,  January 25, 2011 3:27 PM  Additional Follow-up for Phone Call Additional follow up Details #1::        let Dr Maple Hudson sort this out tomorrow Additional Follow-up by: Nyoka Cowden MD,  January 25, 2011 4:15 PM    Additional Follow-up for Phone Call Additional follow up Details #2::    called spoke with patient who states she called the ENT and spoke with the billing dept who informed her that she now owes over $200 and she is unable to afford this at this time.  pt did verbalize her understanding about CDY "sorting this out" and will keep her 2:45pm appt w/ CDY tomorrow. Boone Master CNA/MA  January 25, 2011 4:31 PM

## 2011-01-31 NOTE — Assessment & Plan Note (Signed)
Summary: discuss allergy vaccine start again//kcw   Primary Provider/Referring Provider:  Corwin Levins MD  CC:  Discuss restarting allergy vaccine.  History of Present Illness: August 25, 2010- Allergic rhinosinustis, nasal polyps, asthma, abnl CT (Dr Sherene Sires) Acute vist. One week ago walked 1-2 blocks from work to lunch. By the time she returned she was short of breath, tight nose and chest. Her rescue inhaler didn't work well. Went to ER [facility]. They gave prednisone to take for 5 days- off x 2 days. It did help some.  Now has left frontal pressure, postnasal drip, hoarse, scratchy throat, chest now ok ("at times feel short "). Ears ok.  I find CT report with stable tubular RML opacity, ? mucus. I have not been following her for that- sees Dr Sherene Sires.   October 28, 2010-  Allergic rhinosinustis, nasal polyps, asthma, abnl CT (Dr Sherene Sires) Nurse-CC: Acute visit-Sinus problems; stuffy-blows green in color,wheezing,cough-non productive. She felt better for a least a few weeks after last here. then again head stopped up, productive blowing nose- clear to green, right eye runs, hoarse w/o sore throat, dry cough. Feet began itching- takes loratadine for that. Began wheezing. She took loratadine, flushed nasal saline, used albuterol.  No recent fever. GI ok.   January 26, 2011- Allergic rhinosinustis, nasal polyps, asthma, abnl CT (Dr Sherene Sires) Nurse-CC: Discuss restarting allergy vaccine Discussed with Dr Sherene Sires- told with pansinusitis on CT, that she should go ahead and start allergy shots. She had surgey twice and has billing problems with them .  She also asks for sleep study- given hx loud snore, witnessed apnea, daytime tiredness, wakes herself.      Preventive Screening-Counseling & Management  Alcohol-Tobacco     Smoking Status: never  Current Medications (verified): 1)  Omeprazole 20 Mg Cpdr (Omeprazole) .Marland Kitchen.. 1 By Mouth Two Times A Day Before Meal 2)  Hydrochlorothiazide 25 Mg Tabs  (Hydrochlorothiazide) .... One Daily 3)  Vitamin C 500 Mg Tabs (Ascorbic Acid) .... Take 1 Tablet By Mouth Once A Day 4)  Zinc 50 Mg Tabs (Zinc) .... Take 1 Tablet By Mouth Once A Day 5)  Zantac 150 Mg  Tabs (Ranitidine Hcl) .... One At Bedtime 6)  Biotin 1000 Mcg Tabs (Biotin) .... 3 Tabs By Mouth Once Daily 7)  Klor-Con M10 10 Meq Cr-Tabs (Potassium Chloride Crys Cr) .... 4 By Mouth Once Daily 8)  Fluticasone Propionate 50 Mcg/act  Susp (Fluticasone Propionate) .... 2 Sprays Each Nostril Two Times A Day 9)  Zafirlukast 20 Mg Tabs (Zafirlukast) .... One By Mouth Two Times A Day 10)  Qvar 40 Mcg/act Aers (Beclomethasone Dipropionate) .... 2 Puffs Every 12 Hours 11)  Diltiazem Hcl Cr 240 Mg Xr24h-Cap (Diltiazem Hcl) .Marland Kitchen.. 1 By Mouth Once Daily 12)  Zyrtec Allergy 10 Mg Tabs (Cetirizine Hcl) .... Take 1 Tab By Mouth At Bedtime 13)  Benadryl 25 Mg Caps (Diphenhydramine Hcl) .... Take 1 Capsule By Mouth At Bedtime As Needed For Drippy Nose and Itching 14)  Mucinex D 60-600 Mg Xr12h-Tab (Pseudoephedrine-Guaifenesin) .Marland Kitchen.. 1-2 Two Times A Day As Needed For Congestion and Sinus Pressure 15)  Proair Hfa 108 (90 Base) Mcg/act Aers (Albuterol Sulfate) .Marland Kitchen.. 1-2 Puffs Every 4-6 Hours As Needed 16)  Tramadol Hcl 50 Mg  Tabs (Tramadol Hcl) .Marland Kitchen.. 1 Every 4 Hours As Needed 17)  Sorbitol 70 % Soln (Sorbitol) .... 2 Tbsp At Bedtime As Needed 18)  Flutter Valve .... Use As Directed As Needed For Cough/gagging 19)  Afrin Nasal  Spray 0.05 % Soln (Oxymetazoline Hcl) .... 2 Puffs Two Times A Day For 5 Days 20)  Saline Nasal Spray 0.65 % Soln (Saline) .... 2 Sprays Each Nostril As Needed  Allergies (verified): 1)  ! * Ivp Dye 2)  * Promethazine/codeine  Past History:  Past Medical History: Last updated: 01/11/2011 Hypertension GERD Allergic Rhinitis/sinusitis..........................................................Marland KitchenShoemaker (nasal polypectomy x 2)     - FESS 04/02/2009      - Allergy profile June 20, 2010 >>  no eos,  IgE 1697 > refer to Dr Maple Hudson 07/28/10      - Skin Test- Pos grass, weeds, dust, some trees      - Re CT Sinuses ordered January 06, 2011 >>> Asthma      - HFA 25% June 20, 2010 >   75% June 28, 2010 > 75% January 06, 2011  Obesity Depression Colonic polyps, hx of Health Maintenance.......................................................................John Complex medical regimen       - New calendar provided May 31, 2010 , June 13, 2010, January 11, 2011   Past Surgical History: Last updated: 07/28/2010 Status post sinus surgery in August 2007 with Dr. Annalee Genta. Nasal polypectomy x 2 Status post foot surgery in 05/11/1997. Tubal ligation  Family History: Last updated: 07/28/2010 Mother is aged 88 - DM II, asthma, allergies, and congestive heart failure.  Father deceased at age 33 - history of epilepsy  Social History: Last updated: 12/12/10 Widowed - husband died 2006/05/11 Occupation:  Museum/gallery curator at [employer] Tobacco use-never Alcohol use-no 3 children - 1 died with homicide She lives with another son and his wife- he has had second double lung transpant after complications of MVA. Drug use-no  Risk Factors: Smoking Status: never (01/26/2011)  Review of Systems      See HPI       The patient complains of shortness of breath with activity, non-productive cough, nasal congestion/difficulty breathing through nose, and sneezing.  The patient denies shortness of breath at rest, productive cough, coughing up blood, chest pain, irregular heartbeats, acid heartburn, indigestion, loss of appetite, weight change, abdominal pain, difficulty swallowing, sore throat, tooth/dental problems, headaches, ear ache, rash, change in color of mucus, and fever.    Vital Signs:  Patient profile:   60 year old female Height:      62 inches Weight:      201.38 pounds BMI:     36.97 O2 Sat:      95 % on Room air Pulse rate:   66 / minute BP sitting:   112 / 64  (left arm) Cuff size:    large  Vitals Entered By: Reynaldo Minium CMA (January 26, 2011 3:34 PM)  O2 Flow:  Room air CC: Discuss restarting allergy vaccine   Physical Exam  Additional Exam:  General: A/Ox3; pleasant and cooperative, NAD, overweight SKIN: no rash, lesions NODES: no lymphadenopathy HEENT: [place]/AT, EOM- WNL, Conjuctivae- clear, PERRLA, TM-WNL, Nose- clear, Throat- clear and wnl. On mucosa of left> right cheeks, there appear to be prominent salivary ducts. Deep voice.  NECK: Supple w/ fair ROM, JVD- none, normal carotid impulses w/o bruits Thyroid- normal to palpation CHEST: Clear to P&A HEART: RRR, no m/g/r heard ABDOMEN:  UJW:JXBJ, nl pulses, no edema  NEURO: Grossly intact to observation        Impression & Recommendations:  Problem # 1:  ALLERGIC RHINITIS (ICD-477.9)  We had a detailed discussion of allergy vaccine, risk and beneft and honest limitations. She wants to give it a trial.  We can repeat the neb and depo she feels has helped her before.  Her updated medication list for this problem includes:    Fluticasone Propionate 50 Mcg/act Susp (Fluticasone propionate) .Marland Kitchen... 2 sprays each nostril two times a day    Zyrtec Allergy 10 Mg Tabs (Cetirizine hcl) .Marland Kitchen... Take 1 tab by mouth at bedtime    Benadryl 25 Mg Caps (Diphenhydramine hcl) .Marland Kitchen... Take 1 capsule by mouth at bedtime as needed for drippy nose and itching    Afrin Nasal Spray 0.05 % Soln (Oxymetazoline hcl) .Marland Kitchen... 2 puffs two times a day for 5 days    Saline Nasal Spray 0.65 % Soln (Saline) .Marland Kitchen... 2 sprays each nostril as needed  Problem # 2:  SINUSITIS- ACUTE-NOS (ICD-461.9) Severe pansinusistis, probably with recurrent nasal polyposis. if she feels she can, I would encourage return to Dr Annalee Genta.  Her updated medication list for this problem includes:    Fluticasone Propionate 50 Mcg/act Susp (Fluticasone propionate) .Marland Kitchen... 2 sprays each nostril two times a day    Mucinex D 60-600 Mg Xr12h-tab  (Pseudoephedrine-guaifenesin) .Marland Kitchen... 1-2 two times a day as needed for congestion and sinus pressure    Afrin Nasal Spray 0.05 % Soln (Oxymetazoline hcl) .Marland Kitchen... 2 puffs two times a day for 5 days    Saline Nasal Spray 0.65 % Soln (Saline) .Marland Kitchen... 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 We discussed sleep hygiene, driving safety, the basics of OSA and the sleep study process.   Other Orders: Est. Patient Level III (81191) Admin of Therapeutic Inj  intramuscular or subcutaneous (47829) Depo- Medrol 80mg  (J1040) Nebulizer Tx (56213) Sleep Study (Sleep Study)  Patient Instructions: 1)  Please schedule a follow-up appointment in 2 months. 2)  I will send intructions to the allergy lab and they will be calling you about starting your allergy shots here.  3)  neb neo nasal 4)  depo 80  5)  See PCC to schedule sleep study     Medication Administration  Injection # 1:    Medication: Depo- Medrol 80mg     Diagnosis: ALLERGIC RHINITIS (ICD-477.9)    Route: SQ    Site: RUOQ gluteus    Exp Date: 06/2013    Lot #: [id number]    Mfr: Pharmacia    Patient tolerated injection without complications    Given by: Reynaldo Minium CMA (January 26, 2011 4:55 PM)  Medication # 1:    Medication: EMR miscellaneous medications    Diagnosis: ALLERGIC RHINITIS (ICD-477.9)    Dose: 3drops     Route: intranasal    Exp Date: 09-2012    Lot #: [id number]    Mfr: Novartis    Comments: 4 way fast acting    Patient tolerated medication without complications    Given by: Reynaldo Minium CMA (January 26, 2011 4:56 PM)  Orders Added: 1)  Est. Patient Level III [96295] 2)  Admin of Therapeutic Inj  intramuscular or subcutaneous [96372] 3)  Depo- Medrol 80mg  [J1040] 4)  Nebulizer Tx [28413] 5)  Sleep Study [Sleep Study]

## 2011-02-01 ENCOUNTER — Ambulatory Visit (HOSPITAL_BASED_OUTPATIENT_CLINIC_OR_DEPARTMENT_OTHER): Payer: Managed Care, Other (non HMO)

## 2011-02-09 ENCOUNTER — Ambulatory Visit (INDEPENDENT_AMBULATORY_CARE_PROVIDER_SITE_OTHER): Payer: Managed Care, Other (non HMO) | Admitting: Internal Medicine

## 2011-02-09 ENCOUNTER — Encounter: Payer: Self-pay | Admitting: Internal Medicine

## 2011-02-09 DIAGNOSIS — M549 Dorsalgia, unspecified: Secondary | ICD-10-CM

## 2011-02-09 DIAGNOSIS — R209 Unspecified disturbances of skin sensation: Secondary | ICD-10-CM

## 2011-02-09 DIAGNOSIS — K219 Gastro-esophageal reflux disease without esophagitis: Secondary | ICD-10-CM

## 2011-02-09 DIAGNOSIS — I10 Essential (primary) hypertension: Secondary | ICD-10-CM

## 2011-02-09 DIAGNOSIS — M479 Spondylosis, unspecified: Secondary | ICD-10-CM | POA: Insufficient documentation

## 2011-02-09 NOTE — Progress Notes (Signed)
Summary: needs to reshcedule her sleep study  Phone Note Call from Patient   Caller: Patient Call For: YOUNG Summary of Call: Patient phoned stated that she has an appointment on Wednesday the 29th and she was reading the forms and it stated that you should was your hair that day and not put any type of gel or styling lotion in it and that is a work day for her and she can not go with out having styling products in her hair. She can be reached at [phone] or [phone] Initial call taken by: Vedia Coffer,  January 30, 2011 3:24 PM  Follow-up for Phone Call        called pt and gaver her the # to sleep ctr to call and reschedule the npsg  Follow-up by: Oneita Jolly,  January 30, 2011 3:44 PM
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
## 2011-02-16 ENCOUNTER — Ambulatory Visit (INDEPENDENT_AMBULATORY_CARE_PROVIDER_SITE_OTHER): Payer: Managed Care, Other (non HMO)

## 2011-02-16 ENCOUNTER — Encounter: Payer: Self-pay | Admitting: Internal Medicine

## 2011-02-16 ENCOUNTER — Ambulatory Visit: Payer: Managed Care, Other (non HMO) | Admitting: Internal Medicine

## 2011-02-16 DIAGNOSIS — J301 Allergic rhinitis due to pollen: Secondary | ICD-10-CM | POA: Insufficient documentation

## 2011-02-17 ENCOUNTER — Telehealth: Payer: Self-pay | Admitting: Internal Medicine

## 2011-02-20 ENCOUNTER — Encounter: Payer: Self-pay | Admitting: Internal Medicine

## 2011-02-20 ENCOUNTER — Ambulatory Visit (INDEPENDENT_AMBULATORY_CARE_PROVIDER_SITE_OTHER): Payer: Managed Care, Other (non HMO)

## 2011-02-20 DIAGNOSIS — J301 Allergic rhinitis due to pollen: Secondary | ICD-10-CM

## 2011-02-21 NOTE — Progress Notes (Signed)
Summary: nos appt  Phone Note Call from Patient   Caller: [email or web address]  Call For: Andrea Mason Summary of Call: In ref to nos from 3/15 pt has appt on 3/26. Initial call taken by: Andrea Mason,  February 17, 2011 9:26 AM

## 2011-02-21 NOTE — Miscellaneous (Signed)
Summary: [facility]   Imported By: Sherian Rein 02/13/2011 14:10:32  _____________________________________________________________________  External Attachment:    Type:   Image     Comment:   External Document

## 2011-02-21 NOTE — Assessment & Plan Note (Signed)
Summary: ALLERGY/CB   Nurse Visit   Allergies: 1)  ! * Ivp Dye 2)  * Promethazine/codeine  Orders Added: 1)  Allergy Injection (1) [95115] 

## 2011-02-24 ENCOUNTER — Ambulatory Visit (INDEPENDENT_AMBULATORY_CARE_PROVIDER_SITE_OTHER): Payer: Managed Care, Other (non HMO)

## 2011-02-24 DIAGNOSIS — J301 Allergic rhinitis due to pollen: Secondary | ICD-10-CM

## 2011-02-27 ENCOUNTER — Ambulatory Visit: Payer: Self-pay | Admitting: Internal Medicine

## 2011-02-28 ENCOUNTER — Ambulatory Visit (INDEPENDENT_AMBULATORY_CARE_PROVIDER_SITE_OTHER): Payer: Managed Care, Other (non HMO)

## 2011-02-28 DIAGNOSIS — J301 Allergic rhinitis due to pollen: Secondary | ICD-10-CM

## 2011-03-02 NOTE — Assessment & Plan Note (Signed)
Summary: ALLERGY/CB   Nurse Visit   Allergies: 1)  ! * Ivp Dye 2)  * Promethazine/codeine  Orders Added: 1)  Allergy Injection (1) [16109]

## 2011-03-03 ENCOUNTER — Ambulatory Visit (INDEPENDENT_AMBULATORY_CARE_PROVIDER_SITE_OTHER): Payer: Managed Care, Other (non HMO)

## 2011-03-03 DIAGNOSIS — J301 Allergic rhinitis due to pollen: Secondary | ICD-10-CM

## 2011-03-08 ENCOUNTER — Telehealth: Payer: Self-pay | Admitting: Internal Medicine

## 2011-03-08 ENCOUNTER — Other Ambulatory Visit: Payer: Self-pay | Admitting: Internal Medicine

## 2011-03-08 NOTE — Telephone Encounter (Signed)
Called and spoke with pt and she is aware of appt in the am with CY at 10:15--pt has been added onto the schedule --ok per Florentina Addison and CY

## 2011-03-08 NOTE — Telephone Encounter (Signed)
Pt c/o sore throat, cough with green to white sputum, wheezing, SOB w/ exert amd soreness in her chest since Sun., 03/05/2011. She would like to be seen by [facility] or either have something called to her pharmacy. Pls advise. Allergies (verified):  1)  ! * Ivp Dye 2)  * Promethazine/codeine

## 2011-03-09 ENCOUNTER — Encounter: Payer: Self-pay | Admitting: Internal Medicine

## 2011-03-09 ENCOUNTER — Ambulatory Visit (INDEPENDENT_AMBULATORY_CARE_PROVIDER_SITE_OTHER): Payer: Managed Care, Other (non HMO) | Admitting: Internal Medicine

## 2011-03-09 ENCOUNTER — Ambulatory Visit (INDEPENDENT_AMBULATORY_CARE_PROVIDER_SITE_OTHER): Payer: Managed Care, Other (non HMO)

## 2011-03-09 VITALS — BP 112/68 | HR 92 | Temp 97.1°F | Ht 61.5 in | Wt 197.6 lb

## 2011-03-09 DIAGNOSIS — J069 Acute upper respiratory infection, unspecified: Secondary | ICD-10-CM

## 2011-03-09 DIAGNOSIS — J309 Allergic rhinitis, unspecified: Secondary | ICD-10-CM

## 2011-03-09 DIAGNOSIS — R05 Cough: Secondary | ICD-10-CM

## 2011-03-09 DIAGNOSIS — J301 Allergic rhinitis due to pollen: Secondary | ICD-10-CM

## 2011-03-09 LAB — POCT I-STAT, CHEM 8
BUN: 10 mg/dL (ref 6–23)
Creatinine, Ser: 0.7 mg/dL (ref 0.4–1.2)
Glucose, Bld: 104 mg/dL — ABNORMAL HIGH (ref 70–99)
Hemoglobin: 13.6 g/dL (ref 12.0–15.0)
Potassium: 3 mEq/L — ABNORMAL LOW (ref 3.5–5.1)

## 2011-03-09 LAB — CBC
HCT: 38.6 % (ref 36.0–46.0)
MCHC: 34.5 g/dL (ref 30.0–36.0)
MCV: 88.8 fL (ref 78.0–100.0)
Platelets: 216 10*3/uL (ref 150–400)
RBC: 4.35 MIL/uL (ref 3.87–5.11)
WBC: 6.1 10*3/uL (ref 4.0–10.5)

## 2011-03-09 LAB — DIFFERENTIAL
Eosinophils Absolute: 0.2 10*3/uL (ref 0.0–0.7)
Eosinophils Relative: 4 % (ref 0–5)
Lymphs Abs: 1.6 10*3/uL (ref 0.7–4.0)
Monocytes Relative: 7 % (ref 3–12)

## 2011-03-09 MED ORDER — METHYLPREDNISOLONE ACETATE 80 MG/ML IJ SUSP
80.0000 mg | Freq: Once | INTRAMUSCULAR | Status: AC
  Administered 2011-03-09: 80 mg via INTRAMUSCULAR

## 2011-03-09 MED ORDER — ALBUTEROL SULFATE (2.5 MG/3ML) 0.083% IN NEBU
2.5000 mg | INHALATION_SOLUTION | Freq: Once | RESPIRATORY_TRACT | Status: AC
  Administered 2011-03-09: 2.5 mg via RESPIRATORY_TRACT

## 2011-03-09 MED ORDER — AZITHROMYCIN 250 MG PO TABS: 250.0000 mg | ORAL_TABLET | Freq: Every day | ORAL | Status: AC
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
## 2011-03-09 NOTE — Patient Instructions (Signed)
Neb albut  Depo 80  Script sent for Zpak to drug store  Fluids will help. Try to stay in doors and away from mid-day high pollen counts when you can.

## 2011-03-09 NOTE — Assessment & Plan Note (Addendum)
Lives with son who has had second double-lung transplant. He has "parainfluenza" and acute respiratory symptoms, increasing liklihood that her illness may be infectious- probably viral. Can't exclude a pollen exacerbation given the season. We discussed options and will give neb, depo, and Z pak.

## 2011-03-14 ENCOUNTER — Ambulatory Visit (INDEPENDENT_AMBULATORY_CARE_PROVIDER_SITE_OTHER): Payer: Managed Care, Other (non HMO)

## 2011-03-14 DIAGNOSIS — J301 Allergic rhinitis due to pollen: Secondary | ICD-10-CM

## 2011-03-15 LAB — BASIC METABOLIC PANEL
BUN: 10 mg/dL (ref 6–23)
CO2: 33 mEq/L — ABNORMAL HIGH (ref 19–32)
GFR calc non Af Amer: >60 mL/min (ref >60)
Glucose, Bld: 89 mg/dL (ref 70–99)
Potassium: 2.9 mEq/L — ABNORMAL LOW (ref 3.5–5.1)

## 2011-03-15 LAB — CBC
HCT: 40.4 % (ref 36.0–46.0)
Hemoglobin: 13.6 g/dL (ref 12.0–15.0)
MCHC: 33.8 g/dL (ref 30.0–36.0)
MCV: 88.8 fL (ref 78.0–100.0)
Platelets: 242 10*3/uL (ref 150–400)
RBC: 4.55 MIL/uL (ref 3.87–5.11)
RDW: 13.3 % (ref 11.5–15.5)
WBC: 8.9 10*3/uL (ref 4.0–10.5)

## 2011-03-15 LAB — FUNGUS CULTURE W SMEAR: Fungal Smear: NONE SEEN

## 2011-03-16 ENCOUNTER — Encounter: Payer: Self-pay | Admitting: Internal Medicine

## 2011-03-16 NOTE — Assessment & Plan Note (Signed)
She is very early in vaccine build-up protocol and won't be seeing benefit yet. We discussed allergy vs infection symptom overlap

## 2011-03-16 NOTE — Assessment & Plan Note (Signed)
Acute syndrome seems more likely viral than seasonal pollen allergy. Not sure if this is something she would have caught from her son, but supportive care will be most important. Education done.

## 2011-03-17 ENCOUNTER — Ambulatory Visit (INDEPENDENT_AMBULATORY_CARE_PROVIDER_SITE_OTHER): Payer: Managed Care, Other (non HMO)

## 2011-03-17 DIAGNOSIS — J309 Allergic rhinitis, unspecified: Secondary | ICD-10-CM

## 2011-03-21 ENCOUNTER — Ambulatory Visit (INDEPENDENT_AMBULATORY_CARE_PROVIDER_SITE_OTHER): Payer: Managed Care, Other (non HMO)

## 2011-03-21 DIAGNOSIS — J309 Allergic rhinitis, unspecified: Secondary | ICD-10-CM

## 2011-03-22 ENCOUNTER — Ambulatory Visit (INDEPENDENT_AMBULATORY_CARE_PROVIDER_SITE_OTHER): Payer: Managed Care, Other (non HMO)

## 2011-03-22 DIAGNOSIS — J309 Allergic rhinitis, unspecified: Secondary | ICD-10-CM

## 2011-03-27 ENCOUNTER — Ambulatory Visit (INDEPENDENT_AMBULATORY_CARE_PROVIDER_SITE_OTHER): Payer: Self-pay

## 2011-03-27 DIAGNOSIS — J309 Allergic rhinitis, unspecified: Secondary | ICD-10-CM

## 2011-03-28 ENCOUNTER — Encounter: Payer: Self-pay | Admitting: Internal Medicine

## 2011-03-30 ENCOUNTER — Ambulatory Visit: Payer: Managed Care, Other (non HMO) | Admitting: Internal Medicine

## 2011-03-31 ENCOUNTER — Ambulatory Visit (INDEPENDENT_AMBULATORY_CARE_PROVIDER_SITE_OTHER): Payer: Self-pay

## 2011-03-31 DIAGNOSIS — J309 Allergic rhinitis, unspecified: Secondary | ICD-10-CM

## 2011-04-04 ENCOUNTER — Other Ambulatory Visit: Payer: Self-pay | Admitting: Internal Medicine

## 2011-04-05 ENCOUNTER — Ambulatory Visit (INDEPENDENT_AMBULATORY_CARE_PROVIDER_SITE_OTHER): Payer: Self-pay

## 2011-04-05 DIAGNOSIS — J309 Allergic rhinitis, unspecified: Secondary | ICD-10-CM

## 2011-04-07 ENCOUNTER — Ambulatory Visit (INDEPENDENT_AMBULATORY_CARE_PROVIDER_SITE_OTHER): Payer: Self-pay

## 2011-04-07 DIAGNOSIS — J309 Allergic rhinitis, unspecified: Secondary | ICD-10-CM

## 2011-04-13 ENCOUNTER — Ambulatory Visit (INDEPENDENT_AMBULATORY_CARE_PROVIDER_SITE_OTHER): Payer: Self-pay

## 2011-04-13 ENCOUNTER — Telehealth: Payer: Self-pay | Admitting: Internal Medicine

## 2011-04-13 DIAGNOSIS — J309 Allergic rhinitis, unspecified: Secondary | ICD-10-CM

## 2011-04-13 NOTE — Telephone Encounter (Signed)
2 boxes of qvar up front for pick up. Pt aware.

## 2011-04-13 NOTE — Telephone Encounter (Signed)
PATIENT WALKED IN TO REQUEST SAMPLES OF ALL MEDICATION THAT SHE TAKES.  RECENTLY LOST HER JOB.

## 2011-04-18 NOTE — Assessment & Plan Note (Signed)
[facility]                             PULMONARY OFFICE NOTE   NAME:Raburn, [facility]                       MRN:          [id number]  DATE:11/07/2007                            DOB:          07/27/1951    HISTORY OF PRESENT ILLNESS:  The patient is a 60 year old African-  American female patient of Dr. Thurston Hole who has a known history of chronic  rhinosinusitis and gastroesophageal reflux who presents today for an  acute office visit.  The patient complains over the last 2 weeks that  she has had increased cough, congestion, wheezing.  The patient denies  any hemoptysis, orthopnea, PND, or leg swelling.  The patient has used  some over-the-counter products without much improvement.   PAST MEDICAL HISTORY:  Reviewed.   CURRENT MEDICATIONS:  Reviewed.   PHYSICAL EXAM:  The patient is a pleasant female in no acute distress.  She is afebrile with stable vital signs.  HEENT:  Nasal mucosa with some mild erythema.  Nontender sinuses.  Posterior pharynx is clear.  NECK:  Supple without cervical adenopathy.  No JVD.  LUNGS:  Reveal some coarse breath sounds with a faint expiratory wheeze.  CARDIAC:  Regular rate and rhythm.  ABDOMEN:  Soft and nontender.  EXTREMITIES:  Warm without any edema.   IMPRESSION AND PLAN:  Acute asthmatic bronchitic exacerbation.  The  patient is to begin Augmentin x7 days.  Mucinex DM twice a day.  Prednisone taper over the next week.  The patient is to return back with  Dr. Sherene Sires as scheduled or sooner as needed.      Rubye Oaks, NP  Electronically Signed      Charlaine Dalton. Sherene Sires, MD, [facility]  Electronically Signed   TP/MedQ  DD: 11/11/2007  DT: 11/11/2007  Job #: [id number]

## 2011-04-18 NOTE — Assessment & Plan Note (Signed)
[facility]                           PRIMARY CARE OFFICE NOTE   NAME:Dupriest, [facility]                       MRN:          [id number]  DATE:08/08/2007                            DOB:          1951-10-16    CHIEF COMPLAINT:  New patient to practice.   HISTORY OF PRESENT ILLNESS:  The patient is a 60 year old African-  American female here to establish primary care. She was referred by Dr.  Sherene Sires. She was formerly followed by Maurice Small at [facility]. She has struggled with difficulty in controlling symptoms of  coughing and dyspnea. She underwent sinus surgery in May 2007. Despite  surgery, she continues to have persistent purulent nasal discharge.   She is currently on a regimen of Fluticasone nasal spray, Singulair, and  Fexofenadine. She also takes Mucinex DM twice daily. She was advised by  her ENT to perform nasal saline irrigation. She has not been compliant.   Her other concern today is progressive weight gain. She has struggled  with obesity for most of her life. She is at a current weight of 222  pounds. She has a family history of type-2 diabetes. Her mother, sister,  and brother all have type-2 diabetes.   PAST MEDICAL HISTORY SUMMARY:  1. Chronic rhinosinusitis.  2. Frequent headaches.  3. Gastroesophageal reflux disease.  4. Hypertension.  5. Status post sinus surgery in August 2007 with Dr. Annalee Genta.  6. Status post foot surgery in 1998.  7. Obesity.   CURRENT MEDICATIONS:  1. Fluticasone nasal spray 2 sprays each nostril once daily.  2. Singulair 10 mg once daily.  3. Amlodipine 5 mg once daily.  4. Hydrochlorothiazide 25 mg 1/2 once daily.  5. Qvar inhaler 2 puffs b.i.d.  6. Mucinex DM once daily.  7. Fexofenadine 180 mg daily.  8. Albuterol inhaler 2 puffs every 4 hours as needed.   ALLERGIES:  IV DYE.   SOCIAL HISTORY:  The patient is widowed and living alone. Her husband  died in 01-21-2006. She works  as a Museum/gallery curator.   FAMILY HISTORY:  Mother is aged 43. She has type-2 diabetes, asthma, and  congestive heart failure. Father deceased at age 88 and is known to have  had epilepsy.   HABITS:  No alcohol and no tobacco.   PREVENTATIVE CARE HISTORY:  Last colonoscopy 5 years ago with Dr. Ewing Schlein.  Last mammogram 2005.   REVIEW OF SYSTEMS:  HEENT symptoms as noted above. She denies any  history of chest pain. She has exercise intolerance. Positive heartburn  with difficulty obtaining a proton pump inhibitor. All other systems  negative.   PHYSICAL EXAMINATION:  VITAL SIGNS:  Height 5 foot 2 inches, weight 222  pounds, temperature 97.9, pulse 67, blood pressure 127/78 in the left  arm in the seated position.  GENERAL:  The patient is a pleasant, morbidly obese, 60 year old African-  American female in no apparent distress.  HEENT:  Normocephalic atraumatic. Pupils equal, round, and reactive to  light bilaterally. Extraocular motility was intact. The patient was  anicteric. Conjunctivae was within normal limits.  External auditory  canals and tympanic membranes are clear bilaterally. Hearing was grossly  normal. Oropharyngeal exam was unremarkable, positive signs of post-  nasal drip.  NECK:  Thick, supple. No adenopathy or carotid bruit.  CHEST:  Normal inspiratory effort. Clear to auscultation bilaterally. No  rhonchi, rales, or wheezing.  CARDIOVASCULAR:  Regular rate and rhythm, no significant murmurs, rubs,  or gallops appreciated.  ABDOMEN:  Obese, but nontender. Positive bowel sounds. Unable to  appreciate organomegaly.  MUSCULOSKELETAL:  No cyanosis, clubbing, or edema.  SKIN:  Warm and dry.  NEUROLOGIC:  Cranial nerves II-XII were grossly intact. She was non-  focal.  FEET:  Revealed bilateral scaliness, but no redness or other skin  lesions.   IMPRESSION:  1. Chronic rhinosinusitis.  2. Morbid obesity with family history of type-2 diabetes.  3. Hypertension,  stable.  4. Gastroesophageal reflux disease.  5. History of colon polyps.  6. Health maintenance.   RECOMMENDATIONS:  1. The patient was given samples of Xyzal 5 mg to be taken at bedtime.      She was given      samples of Nexium and a prescription for 40 mg to be taken once      daily ac a.m. meals.  2. We will obtain baseline labs including fasting sugar, a1C, and      fasting lipid panel.  3. Follow up in 4 to 6 weeks.     Barbette Hair. Artist Pais, DO  Electronically Signed    RDY/MedQ  DD: 08/08/2007  DT: 08/09/2007  Job #: [id number]

## 2011-04-18 NOTE — Assessment & Plan Note (Signed)
[facility]                             PULMONARY OFFICE NOTE   NAME:Andrea Mason, Andrea Mason                       MRN:          [id number]  DATE:05/31/2007                            DOB:          1951/06/20    HISTORY:  This is a 60 year old black female with difficult-to-control  symptoms of coughing and dyspnea, largely attributed to sinus disease  and, in fact, underwent sinus surgery in May of 2007, and comes back now  with persistent purulent nasal discharge and early morning purulent  sputum associated with sore throat and a sensation of nasal congestion,  but no increase in dyspnea on her present regimen of Qvar 80 two puffs  b.i.d.   For full inventory of her complex medical regimen, please see face sheet  dated May 31, 2007.  Note, the patient suffers from difficult-to-  control reflux and states that Nexium is the only thing that helps, but  is no longer getting it from her insurance company because you need to  write them a letter.   PHYSICAL EXAM:  She is an animated, pleasant, ambulatory black female in  no acute distress.  VITAL SIGNS:  Stable vital signs.  HEENT:  Reveals moderate turbinate edema.  Oropharynx is clear.  NECK:  Supple without cervical adenopathy or tenderness. Trachea is  midline.  No thyromegaly.  LUNGS:  The lung fields perfectly clear bilaterally to auscultation and  percussion.  CARDIAC:  Regular rate and rhythm without murmur, gallop, or rub.  ABDOMEN:  Soft and benign.  EXTREMITIES:  Warm without calf tenderness, cyanosis, clubbing, or  edema.   IMPRESSION:  1. Most of this patient's symptoms are upper airway and not lower      airway in nature.  Therefore, I think it is reasonable to approve      her for proton pump inhibitor therapy, but I am not clear why      Nexium worked so much better than all the other proton pump      inhibitors, and would prefer that Dr. Ewing Schlein, her gastroenterologist      of record,  supply the information to the insurance company for      justification for this medication.  2. She has purulent nasal discharge and early morning sputum      production dating back to February of this year, consistent with      chronic sinusitis, and I gave her 10 days of Augmentin with the      recommendation to follow up with a sinus CT scan, and I also      reviewed nasal hygiene issues with her.  If this does not suffice,      the next logical step was to return to Dr. Annalee Genta after we do a      repeat sinus CT scan to document active sinusitis after 10 days of      Augmentin.  3. Finally, the asthma component of her problem is minimal.  Since      cough is such a prominent feature, I have selected Qvar as the  inhaled steroid of choice and recommended switching from the Qvar      80 to Qvar 40 because clearly the asthma component of her problem      is well-      controlled and the indication for this is she has had no flare-up      of asthma despite very poor control of rhinitis/sinusitis now for      months.     Charlaine Dalton. Sherene Sires, MD, [facility]  Electronically Signed    MBW/MedQ  DD: 05/31/2007  DT: 05/31/2007  Job #: [id number]   cc:   Onalee Hua L. Annalee Genta, M.D.  Petra Kuba, M.D.

## 2011-04-18 NOTE — Op Note (Signed)
NAMEKARUNA, BALDUCCI              ACCOUNT NO.:  [id number]   MEDICAL RECORD NO.:  [id number]          PATIENT TYPE:  AMB   LOCATION:  SDS                          FACILITY:  [facility]   PHYSICIAN:  Kinnie Scales. Annalee Genta, M.D.DATE OF BIRTH:  1951-09-13   DATE OF PROCEDURE:  04/02/2009  DATE OF DISCHARGE:  04/02/2009                               OPERATIVE REPORT   PREOPERATIVE DIAGNOSES:  1. Chronic nasal polyposis.  2. Chronic sinusitis.  3. Status post prior endoscopic sinus surgery and turbinate reduction      on July 20, 2006.  4. History of chronic asthma.   POSTOPERATIVE DIAGNOSES:  1. Chronic nasal polyposis.  2. Chronic sinusitis.  3. Status post prior endoscopic sinus surgery and turbinate reduction      on July 20, 2006.  4. History of chronic asthma.   INDICATIONS FOR SURGERY:  1. Chronic nasal polyposis.  2. Chronic sinusitis.  3. Status post prior endoscopic sinus surgery and turbinate reduction      on July 20, 2006.  4. History of chronic asthma.   SURGICAL PROCEDURES:  Bilateral revision endoscopic sinus surgery with  computer-assisted navigation [facility]) consisting of bilateral total  ethmoidectomies, bilateral nasal frontal recess exploration, and  bilateral maxillary antrostomy with removal of diseased tissue.   ANESTHESIA:  General endotracheal.   SURGEON:  Kinnie Scales. Annalee Genta, MD   COMPLICATIONS:  None.   ESTIMATED BLOOD LOSS:  Less than 200 mL.   The patient was transferred from the operating room to the recovery room  in stable condition.   BRIEF HISTORY:  Ms. Worthey is a 60 year old black female, who has been  followed in our office with a history of chronic sinusitis.  She has a  history of progressive reactive airway disease and chronic nasal  polyposis.  She underwent bilateral endoscopic sinus surgery in August  2007 for nasal polypectomy and turbinate reduction.  The patient had a  significant dramatic improvement in breathing and  sinonasal symptoms  after her procedure.  Gradually over the last 4-6 months, she has had  increasing symptoms of nasal congestion, sinus pressure, and  intermittent reactive airway disease.  The patient has been followed  closely in the office using saline nasal spray, nasal steroid spray, and  continue on her asthma medications.  CT scanning in the office showed  significant recurrent polypoid disease involving all sinuses without  obstruction of the nasal passageway.  Given the patient's history and  physical examination, we discussed various treatment options and I  recommend we consider her for bilateral revision endoscopic sinus  surgery.  The risks, benefits, and possible complications of procedure  were discussed in detail, and the patient understood and concurred with  our plan for surgery, which is scheduled as an outpatient under general  anesthesia.  Prior to surgery, the patient underwent BrainLAB formatted  CT scan of the sinus for intraoperative computer-assisted navigation.  She was also started on preoperative steroid dose pack and antibiotics  for increased symptoms of cough.   PROCEDURE:  The patient was brought to the operating room on April 02, 2009, and  placed in supine position on the operating table.  General  endotracheal anesthesia was established without difficulty. When the  patient was adequately anesthetized, her nose was inspected and then  injected with a total of 4 mL of 1% lidocaine with 1:100,000 solution  epinephrine.  Her nose was then packed with Afrin-soaked cottonoid  pledgets, which were left in place for approximately 10 minutes, and  allowed for vasoconstriction and hemostasis.  She was then positioned on  the operating table and prepped and draped in sterile fashion.  The  BrainLAB computer navigation headgear was applied, but anatomic and  surgical landmarks were identified and confirmed.  The patient was then  prepared for surgery.    Surgical procedure was begun on the patient's left-hand side using a 0-  degree endoscope and a straight microdebrider.  The patient's nasal  cavity was thoroughly inspected, polypoid disease within the middle  meatus and common ethmoid cavity was then debrided using the straight  microdebrider.  Total ethmoidectomy was performed removing polypoid  disease within the entire ethmoid sinus.  Posterior ethmoid cells were  identified using the BrainLAB device.  A 45-degree telescope was then  used along the roof of the ethmoid sinus from posterior to anterior.  Nasal frontal recess was identified.  This area was occluded with  polypoid disease.  The 60-degree curved microdebrider was then used to  remove polypoid disease at the nasal frontal recess and within the  frontal sinus.  Attention was then turned to the lateral nasal wall  where the natural ostia of the maxillary sinus was identified.  There  was thick mucinous debris in this area, which was aspirated for a  separate pathologic specimen to be assessed for allergic fungal mucin.  A 60-degree debrider was then used to remove polyps from the left  maxillary antrostomy within the entire maxillary sinus.   Attention was then turned to the right-hand side where the same  procedure was carried out.  Again, using a 0-degree telescope, the  polypoid disease within the common ethmoid cavity was completely  resected.  Posterior ethmoid cells were identified in BrainLAB and 45-  degree telescope was used along the roof of the ethmoid to confirm  anatomic landmarks.  Dissection was then carried into the nasal frontal  recess and polyp disease within the frontal sinus was removed.  The  patient also had a large right supra-ethmoid bullar cell, which was  occluded with polypoid disease and this was resected as well.  Attention  was then turned to the lateral nasal wall where the ostium of maxillary  sinus was identified.  This was enlarged and  polypoid disease was  removed from within the sinus.   With the patient's sinonasal cavity widely patent, cottonoid pledgets  were removed.  A 50/50 mixture of Kenalog 40 and Bactroban was then  instilled within the patient's frontal ethmoid and maxillary sinuses  bilaterally, and Kennedy sinus packs were placed in each common ethmoid  cavity and hydrated with sterile saline solution.  Surgical debris was  cleared.  The patient was suctioned.  Orogastric tube was passed.  The  stomach contents were aspirated.  She was then awakened from anesthetic,  extubated, and transferred from the operating room to the recovery room  in stable condition.  There were no complications and blood loss was  approximately 200 mL.           ______________________________  Kinnie Scales. Annalee Genta, M.D.    DLS/MEDQ  D:  04/54/0981  T:  04/02/2009  Job:  [id number]

## 2011-04-19 ENCOUNTER — Ambulatory Visit (INDEPENDENT_AMBULATORY_CARE_PROVIDER_SITE_OTHER): Payer: Self-pay

## 2011-04-19 DIAGNOSIS — J309 Allergic rhinitis, unspecified: Secondary | ICD-10-CM

## 2011-04-21 ENCOUNTER — Ambulatory Visit (INDEPENDENT_AMBULATORY_CARE_PROVIDER_SITE_OTHER): Payer: Self-pay

## 2011-04-21 DIAGNOSIS — J309 Allergic rhinitis, unspecified: Secondary | ICD-10-CM

## 2011-04-21 NOTE — Assessment & Plan Note (Signed)
[facility]                               PULMONARY OFFICE NOTE   Andrea Mason, Andrea Mason                       MRN:          [id number]  DATE:06/20/2006                            DOB:          February 27, 1951    HISTORY:  This is a 60 year old black female just seen on June 05, 2006 with  a very specific outline of medications that she was to take until her sinus  surgery.  This included the use of prednisone 10 mg tablets, two everyday  until 100% back to baseline and then one every morning.  She called  yesterday requesting that her prednisone be stopped.  I had explained to her  that until sinus disease is corrected, the pattern of her illness, which has  been characterized as atypical asthma, tends to flare repeatedly, requiring  unscheduled office visits and ER trips.  Therefore, it was recommended that  she stay on it until her surgery which is now planned of two weeks from now.  When she stopped taking her medications two days ago, she began having  problems with extra sinus drainage and coughing yesterday but no purulent  secretions at present or chest pain, fever, chills, sweats.   She says she feels fine now, and actually the intent of her call was to tell  me that she ran out of prednisone and just wanted to get it refilled.   I reviewed each and everyone of her medicines from her maintenance versus  p.r.n. sheet that was given to her, and there are multiple inconsistencies  (see Face sheet dated June 20, 2006 for inventory of medications).   PHYSICAL EXAMINATION:  GENERAL:  She is a pleasant ambulatory black female  in no acute distress.  VITAL SIGNS:  Normal vital signs.  HEENT:  Moderate __________.  Oropharynx is clear.  NECK:  Supple, without cervical adenopathy or tenderness.  Trachea is  midline.  LUNGS:  Lung fields are completely clear bilaterally to auscultation and  percussion.  HEART:  Regular rate and rhythm without murmurs,  gallops, or rubs.  ABDOMEN:  Soft and benign.  EXTREMITIES:  Warm and without calf tenderness, cyanosis, clubbing, or  edema.   IMPRESSION:  Poorly controlled rhinitis resulting in recurrent cough and  pseudo wheeze, with no evidence of asthma despite stopping prednisone.  It  is clear that it has only been two days off of prednisone and she is already  beginning to have symptoms of excess postnasal drainage and coughing and  most likely will evolve back into the previous pattern until the sinus  disease is addressed.   I explained this to the patient again in detail, going line by line over the  recommendations and emphasizing  that when the recommendations were made and  because the prescriptions she has that were written were done prior to the  sheet of instructions she was given, there is not going to be and exact  correlation between her bottles and her instructions and that she should  follow the most recent set of instructions, which include to use the  prednisone  on a daily basis.  Since she was doing fine on 10 mg per day, I  think it would reasonable to start over at 20 mg per day until 100% better  and then 10 mg per day for five days and then one-half tablet  thereafter but not come off of prednisone completely until the surgery is  performed.  At that time, I would like to see her back in the office in an  attempt to wean the prednisone completely off.                                   Charlaine Dalton. Sherene Sires, MD, [facility]   MBW/MedQ  DD:  06/21/2006  DT:  06/21/2006  Job #:  [id number]   cc:   Onalee Hua L. Annalee Genta, MD  Gretta Arab Valentina Lucks, MD

## 2011-04-21 NOTE — Assessment & Plan Note (Signed)
[facility]                             PULMONARY OFFICE NOTE   NAME:Andrea Mason, [facility]                       MRN:          [id number]  DATE:11/02/2006                            DOB:          March 03, 1951    HISTORY:  This is a delightful 60 year old black female smoker with  chronic rhinitis and asthma, doing much better on a complex medical  regimen for which she has an unambiguous and user friendly medication  calendar that was made by our nurse practitioner. One problem she has  encountered however is that her insurance will not pay for reflux  medications. The chart indicates that she does much better with  treatment of reflux and rhinitis in terms of airway stability manifested  by dyspnea and cough. She has no trouble presently with either  significant nasal symptoms or cough, wheeze, shortness of breath on her  present regimen which includes Qvar 80 at 2 puffs b.i.d.  For full  medications, please see her medication calendar which is reproduced in  her medication calendar, dated November 02, 2006 except for the increase  in Qvar dosing to 80, 2 puffs b.i.d. which was initiated on October 10, 2006.   PHYSICAL EXAMINATION:  GENERAL: She is a pleasant ambulatory black  female in no acute distress with normal vital signs.  HEENT: Reveals moderate turbinate edema, oropharynx is clear.  NECK: Supple without cervical adenopathy or tenderness. Trachea is  midline. Lymph nodes negative.  LUNGS: Clear bilaterally to auscultation and percussion with excellent  air movement.  HEART: Regular rate and rhythm without murmurs, rubs, or gallops.  ABDOMEN: Soft, benign.  EXTREMITIES: Warm without calf tenderness, cyanosis, clubbing.   IMPRESSION:  Excellent control of all her symptoms with treatment  directed at both rhinitis, reflux and asthma. For now I am reluctant to  change any of the medications but did give her several contingency plans  as follows:   1. If her nose becomes stuffy in addition to using Nasocort, I would      like her to use Afrin for the first 5 days of any flare up so that      she could help the Rhinocort penetrate the target tissue.  I would      like to leave her on the same.  2. Continue Qvar 80 at 2 puffs b.i.d. until her next visit and if she      is doing well at that point consider reducing the dose back down to      40 at 2 puffs b.i.d. to reduce side effects and costs.  3. Continue reflux medications for now but simplify to q.a.m. regimen      consisting of the lowest price proton pump inhibitor on her      insurance plan.  If her asthma flares in the absence of obvious sinus flare then more  aggressive treatment of reflux might be worth considering.     Charlaine Dalton. Sherene Sires, MD, [facility]  Electronically Signed    MBW/MedQ  DD: 11/02/2006  DT: 11/03/2006  Job #: [id number]   cc:  Gretta Arab Valentina Lucks, M.D.

## 2011-04-21 NOTE — Assessment & Plan Note (Signed)
[facility]                               PULMONARY OFFICE NOTE   NAME:Alberg, [facility]                       MRN:          [id number]  DATE:06/22/2006                            DOB:          05-03-1951    This is a pulmonary emergency office evaluation.   HISTORY:  A 60 year old black female who called up today stating that she  could not breath.  The record indicates that she was seen on June 20, 2006, with severe coughing paroxysms after stopping prednisone (was not my  intent as she has poorly controlled rhinitis and sinusitis for which she is  scheduled for surgery on July 20, 2006, and our plan was to stay on  prednisone until surgery at least a dose of 10 mg daily).  She did not get  the prednisone filled initially but took it yesterday and this morning and  comes in stating that she can't go to work or hardly breath because of  severe coughing paroxysms and hoarseness.  She is producing a slightly  green sputum (the mucus she coughed up appears only slightly discolored,  however).  She denies any pleuritic pain and actually she was able to walk  her neighborhood last night without any symptoms.  She denies any nasal  obstructive symptoms, pleuritic pain, fevers, chills, sweats, orthopnea,  PND.   I tried to verify her medication list as inventory and the column date is  June 22, 2006, but found multiple errors (see comments below).   PHYSICAL EXAMINATION:  GENERAL APPEARANCE:  She is an anxious, somewhat  verbose black female who has classic voice fatigue.  The more she talks, the  more profoundly hoarse she becomes.  VITAL SIGNS:  She is afebrile with stable vital signs.  HEENT:  Moderate turbinate edema.  Oropharynx was clear with no excessive  post nasal drainage, cobblestoning or erythema.  NECK:  Supple without cervical adenopathy or tenderness.  Trachea is  midline.  No organomegaly.  There was no stridor.  LUNGS:  Lung fields  were perfectly clear bilaterally to auscultation and  percussion.  CARDIOVASCULAR:  Regular rhythm without murmurs, rubs, or gallops.  ABDOMEN:  Soft, benign.  EXTREMITIES:  Warm without any calf tenderness, cyanosis or clubbing.   IMPRESSION:  Severe hoarseness/vocal cord disfunction related presumably to  rhinitis with post nasal drip syndrome with now slightly purulent  secretions.  Despite profound hoarseness, she has no evidence of stridor or,  for that matter, any true respiratory distress but perfectly clear lungs and  saturations of 100% on room air.   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   Because she has slightly discolored sputum, I am going to give her a 10-day  course of Augmentin and ask again that she follow the instructions that were  previously given to her in writing on June 20, 2006, (I showed her a Building services engineer  copy) emphasizing that she should be maintained on the following, either  Nasacort or Rhinocort two puffs b.i.d., Zegerid 40 mg nightly, prednisone 10  mg two q.a.m. until 100% better then one q.a.m. with the option to go down  to one half q.a.m. if doing great, Protonix 40 mg before breakfast daily  (which she has not taken today), Singulair 10 mg q.p.m., Mucinex DM two  b.i.d. p.r.n. cough and supplement with tramadol 50 mg one q.4h. (which she  has been doing consistently).  I have asked her to avoid all menthol-  containing lozenges, instead use sugarless candy or ice and observe strict  voice rest and have excused her from work today.   In the future, however, if she misses work because of profound hoarseness, I  would prefer she be excused from work by Dr. Thurmon Fair office, not this  one, having documented on multiple occasions now how clear her lungs are  when she is having  exacerbations of her breathing and always associated  with profound hoarseness in our experience here.                                   Charlaine Dalton. Sherene Sires, MD, [facility]   MBW/MedQ  DD:  06/22/2006  DT:  06/22/2006  Job #:  [id number]   cc:   Onalee Hua L. Annalee Genta, MD  Gretta Arab Valentina Lucks, MD

## 2011-04-21 NOTE — Assessment & Plan Note (Signed)
[facility]                               PULMONARY OFFICE NOTE   NAME:Icard, [facility]                       MRN:          [id number]  DATE:07/31/2006                            DOB:          1951/09/11    HISTORY:  This is a 60 year old black female who comes back all smiles  after undergoing sinus surgery on August 17 for refractory symptoms of  coughing and hoarseness.  She is now requesting weaning from prednisone as  we had previously agreed.  However, she is totally confused again regarding  maintenance versus p.r.n. medicines and first asked the question But when  can I stop the Mucinex?Marland Kitchen  The Mucinex was previously suggested in writing  to be used only on a p.r.n. basis for cough.  She denies any cough and  therefore should not be using the Mucinex.  She also denies any significant  dyspnea, chest pain, fevers, chills, sweats, orthopnea, paroxysmal nocturnal  dyspnea or leg swelling.   PHYSICAL EXAMINATION:  On physical examination, she is a pleasant 55-year-  old black female in no acute distress with stable vital signs.  HEENT:  Unremarkable.  Pharynx clear.  Lungs:  Bilaterally clear to auscultation and  percussion.  Heart:  Regular rhythm without murmurs, rubs or gallops.  Abdomen:  Soft and benign.  Extremities:  Warm without calf tenderness,  clubbing, cyanosis or edema.   IMPRESSION:  Complete resolution of all of her asthma symptoms on her  present regimen which does include prednisone still at 20 mg per day because  of instability of her upper airway.  I therefore recommended reducing the  prednisone down to 10 mg per day for 5 days and then 5 mg per day every  other day for 5 doses and then returning for a new medication calendar which  should distinguish once and for all the difference between a maintenance and  a p.r.n. user finally in an unambiguous fashion and may allow [place] to taper  completely off of her medicines.   In terms  of importance, I believe this patient should continue to take PPI  therapy at least before breakfast daily, continue Singulair 10 mg every day  for now, taper off the prednisone as noted above and also use nasal steroids  per Dr. Thurmon Fair instructions.                                   Charlaine Dalton. Sherene Sires, MD, [facility]   MBW/MedQ  DD:  07/31/2006  DT:  08/01/2006  Job #:  [id number]   cc:   Gretta Arab. Valentina Lucks, MD

## 2011-04-21 NOTE — Assessment & Plan Note (Signed)
[facility]                               PULMONARY OFFICE NOTE   NAME:Andrea Mason, Andrea Mason                       MRN:          [id number]  DATE:09/07/2006                            DOB:          [birth year]    HISTORY:  A 59 year old black female with difficult to control asthma,  mostly manifested by cough that was attributed to upper airway instability  but now related to sinusitis and reflux but states she is taking  consistently Protonix 40 mg q.a.m. and Zegerid every night and for the last  week has been having increasing symptoms of subjective wheezing, especially  waking up in the middle of the night and requiring albuterol associated with  mild dyspnea but no pleuritic pain, fevers, chills, sweats, active sinus  complaints, overt reflux symptoms, fevers, chills, sweats, or chest pain.  She is having mild leg swelling attributed to Norvasc (see comments below).   For full inventory of medications, please see patient sheet, dated September 07, 2006.   On physical examination, she is a mildly hoarse, ambulatory, white female no  acute distress with stable vital signs.  HEENT:  Unremarkable.  Pharynx clear.  LUNG:  Fields reveal trace wheeze with FVC maneuver only.  Regular rhythm without murmur, gallop or rub.  ABDOMEN:  Soft, benign.  EXTREMITIES:  Warm without calf tenderness, cyanosis or clubbing and only  trace edema.  VITAL SIGNS:  Blood pressure is 114/80.   IMPRESSION:  1. Difficult to control asthma with minimal objective evidence of      disease presently, but the fact that she needs to wake up and use      albuterol in the middle of the night does suggest to me that despite      stating she is taking Zegerid regularly and having good control of      sinusitis she is still having breakthrough asthma, and therefore, I am      going to recommend Symbicort 80/4.5 two puffs b.i.d. (over Advair since      her upper airway is so unstable that  she is unlikely to tolerate it). I      spent a good time teaching her optimal MDI technique.  2. Leg swelling is probably secondary to Norvasc. Her blood pressure is      well controlled at present. I therefore have taken the opportunity to      reduce the Norvasc dose to 1/2 daily and asked her to return in 2 weeks      to see our nurse practitioner for a new medication encounter and have      her blood pressure rechecked. If she still has swelling on even low-      dose amlodipine I would recommend switching from      amlodipine/hydrochlorothiazide to an ARB/hydrochlorothiazide      combination.            ______________________________  Charlaine Dalton Sherene Sires, MD, [facility]      MBW/MedQ  DD:  09/07/2006  DT:  09/10/2006  Job #:  [id number]   cc:  Gretta Arab Valentina Lucks, M.D.

## 2011-04-21 NOTE — Assessment & Plan Note (Signed)
[facility]                               PULMONARY OFFICE NOTE   NAME:Molesworth, [facility]                       MRN:          [id number]  DATE:09/27/2006                            DOB:          01/17/1951    HISTORY OF PRESENT ILLNESS:  This is a 60 year old African-American female  patient of Dr. Thurston Hole who has a known history of difficult-to-control asthma  with significant cough attributed to upper airway instability related to  sinusitis and reflux.  The patient returns for a 2-week followup.  Last  visit the patient was started on a 10-day course of Augmentin, which she is  now on day #9 of 10 and changed over to Symbicort 80/4.5 two puffs twice  daily.  The patient reports she is substantially improved with decreased  cough, nasal congestion and wheezing.  The patient also had been having some  increased lower extremity edema felt secondary to Norvasc.  She was  decreased down to a half a tablet and reports that her swelling is much  improved.  The patient denies any chest pain, shortness of breath, purulent  sputum or fever.   PHYSICAL EXAMINATION:  The patient is a pleasant female in no acute  distress.  She is afebrile with stable vital signs.  HEENT:  Unremarkable.  NECK:  Supple without adenopathy.  LUNG SOUNDS:  Reveal some coarse breath sounds without any wheezing.  CARDIAC:  Regular rate.  ABDOMEN:  Soft, nontender.  EXTREMITIES:  Warm without any calf tenderness, cyanosis, clubbing or edema.   IMPRESSION AND PLAN:  1. Recent acute sinusitis, now improved after a 10-day course of      Augmentin.  The patient will follow back up with Dr. Sherene Sires in 1 month or      sooner if needed.  2. Difficult-to-control asthma.  Is currently improving on Symbicort.  The      patient will continue on her current regimen and follow back up with      Dr. Sherene Sires in 1 month or sooner if needed.  3. Lower extremity swelling is now improved with decreased  Norvasc dose.  4. Complex medication regimen.  The patient's medications were reviewed in      detail.  Patient education was provided.  The patient's computerized      medication calendar was adjusted for this patient and reviewed in      detail.  The patient aware to bring this back to each and every visit.     ______________________________  Rubye Oaks, NP    ______________________________  Charlaine Dalton. Sherene Sires, MD, [facility]   TP/MedQ  DD: 09/27/2006  DT: 09/28/2006  Job #: [id number]

## 2011-04-21 NOTE — Op Note (Signed)
[facility]  Patient:    TWISHA, VANPELT                       MRN: [id number] Proc. Date: 07/19/00 Adm. Date:  [id number] Disc. Date: [id number] Attending:  Nelda Marseille CC:         Janeece Riggers. Dareen Piano, M.D.   Operative Report  REFERRING PHYSICIAN:  Mark E. Dareen Piano, M.D.  PROCEDURE PERFORMED:  Colonoscopy.  ENDOSCOPIST:  Petra Kuba, M.D.  INDICATIONS:  Family history of colon cancer.  Multiple GI complaints.  CONSENT:  Consent was signed after risks, benefits, methods and options were thoroughly discussed in the office.  MEDICATIONS:  Demerol 70 mg and Versed 7 mg.  DESCRIPTION OF PROCEDURE:  Rectal inspected pertinent for external hemorrhoids.  Digital exam was negative.  Video colonoscope was inserted and fairly easy advanced around the colon to the cecum.  This did require some abdominal pressure, but no position changes.  The cecum was identified by the appendiceal orifice and ileocecal valve.  The terminal ileum was normal. Photo documentation was obtained.  The scope was slowly withdrawn.  After being inserted a short ways in the terminal ileum which was normal, photo documentation was obtained.  The scope was slowly withdrawn.  The prep was adequate.  There was minimal liquid stool that required washing and suctioning and some mucous that required washing.  It was slowly withdrawn through the colon.  No polypoid lesions, masses or other abnormalities were seen.  Once back in the rectum, the scope was retroflexed and pertinent for some tiny internal hemorrhoids.  The scope was drained, air withdrawn and the scope removed.  The patient tolerated the procedure well.  There was no obvious immediate complication.  ENDOSCOPIC DIAGNOSES: 1. Internal and external hemorrhoids. 2. Otherwise negative exam to the terminal ileum.  PLAN: 1. Yearly rectals and guaiacs per Dr. Dareen Piano. 2. Offer her a follow up in two months to recheck  symptoms, but    she has been doing much better on p.r.n. H2 blockers. 3. Will happy to see her back p.r.n. 4. Otherwise decide any further workup and plans based on    symptoms and repeat colonic screening in five years. DD:  07/19/00 TD:  07/21/00 Job: [id number]

## 2011-04-21 NOTE — Assessment & Plan Note (Signed)
[facility]                               PULMONARY OFFICE NOTE   NAME:Cabell, [facility]                       MRN:          [id number]  DATE:09/17/2006                            DOB:          [birth year]    HISTORY:  A [age]-year-old black female with a history of difficult-to-  control asthma, partially attributed to poorly controlled rhinitis  sinusitis, who states she has been having increasing cough over the last 24  hours with nasal congestion and green nasal discharge.  She denies any  fevers, chills, sweats or sinus pain.   MEDICATIONS:  Her medications were reviewed with her in detail, and are  inventoried on the face sheet column dated September 17, 2006, and include  Symbicort, for which she received a 15-day sample on October 5.   PHYSICAL EXAMINATION:  She is a minimally cushingoid-appearing, ambulatory  black female in no acute distress.  She is afebrile, stable vital signs.  HEENT:  Unremarkable, oropharynx is clear.  NECK:  Supple without cervical adenopathy or tenderness.  The trachea was  midline, no thyromegaly or mass.  LUNGS:  Lung fields are clear bilaterally to auscultation and percussion.  Appears regular rhythm without murmur, gallop or rub.  ABDOMEN:  Soft, benign.  EXTREMITIES:  Warm without calf tenderness, cyanosis, clubbing or edema.   IMPRESSION:  Upper airway cough syndrome, either related to poorly  controlled reflux, rhinitis or both.  The fact that she has active purulent  nasal discharge would suggest recurrent sinusitis.  I have recommended  Augmentin for 10 days, and at this point switch Symbicort to QVAR 40 two  puffs two times daily (since it irritates the upper airway the least of all  the metered dose inhaler steroid products), and spent extra time teaching  her optimal metered dose inhaler technique (started at 25% effective, up to  75% effective by the end of the teaching session).   I offered to  see her back here every 2 weeks with all of her medicines in  hand along with the medication calendar she has, to make sure there is 100%  medication reconciliation before adjusting medications further, and  emphasized the use of tramadol 50 mg 1 every 4 hours as  needed to control excessive coughing (clearly if she is coughing to the  point of vomiting, she is refluxing, and if this continues the next step  would be to add Reglan to her regimen).            ______________________________  Charlaine Dalton Sherene Sires, MD, [facility]    MBW/MedQ DD:  09/18/2006 DT:  09/18/2006 Job #:  [id number]

## 2011-04-21 NOTE — Assessment & Plan Note (Signed)
[facility]                               PULMONARY OFFICE NOTE   NAME:Portilla, [facility]                       MRN:          [id number]  DATE:10/10/2006                            DOB:          Aug 01, 1951    HISTORY OF PRESENT ILLNESS:  The patient is a 60 year old, African-American  female patient of Dr. Thurston Hole who has a known history of asthma and chronic  rhinitis who presents for an acute office visit.  The patient complains of a  3-day history of productive cough, nasal congestion and wheezing.  She  denies any hemoptysis, chest pain, orthopnea, PND, or leg swelling.   PAST MEDICAL HISTORY:  Reviewed.   CURRENT MEDICATIONS:  Reviewed.   PHYSICAL EXAM:  The patient is a pleasant, black female in no acute  distress.  She is afebrile with stable vital signs.  O2 saturation is 95% on room air.  HEENT:  Nasal mucosa is erythematous, nontender sinuses to percussion.  Left  TM is slightly bulging and red.  NECK:  Supple without adenopathy.  No JVD.  Lung sounds reveal a few expiratory wheezes.  CARDIAC:  Regular rate and rhythm.  ABDOMEN:  Soft and benign.  EXTREMITIES:  Warm without any edema.   IMPRESSION AND PLAN:  Acute asthmatic bronchitis and left otitis media.  The  patient is to begin Levaquin 750 x5 days.  Prednisone taper over the next  week.  The patient is to increase QVAR up to 80 two puffs twice daily.  Rinse well afterwards.  Recheck with Dr. Sherene Sires as scheduled or sooner if  needed.      Rubye Oaks, NP  Electronically Signed      Charlaine Dalton. Sherene Sires, MD, [facility]  Electronically Signed   TP/MedQ  DD: 10/15/2006  DT: 10/15/2006  Job #: [id number]

## 2011-04-21 NOTE — Assessment & Plan Note (Signed)
[place] [facility]                               PULMONARY OFFICE NOTE   NAME:People, [facility]                       MRN:          [id number]  DATE:08/14/2006                            DOB:          Mar 28, 1951    HISTORY OF PRESENT ILLNESS:  The patient is a 60 year old African-American  female patient of Dr. Sherene Sires,  who has a history of chronic cough, severe  hoarseness, and rhinitis, and chronic sinus disease.  She presents for a two-  week follow-up.  The patient recently had sinus surgery on August 17 for  refractory symptoms of coughing and hoarseness with significant improvement  in symptoms and total resolution of cough and hoarseness.  The patient  brings all of her medications in today for review.  She reports she is doing  exceptionally well without any difficulties.  Her medications, in fact, do  match our medicine list.  The patient has been tapering down prednisone and  is on 5 mg every other day.  She denies any flare of symptoms with the  decreasing steroid doses.   PAST MEDICAL HISTORY:  Reviewed.   CURRENT MEDICATIONS:  Reviewed.   PHYSICAL EXAM:  GENERAL:  The patient is a very pleasant black female in no  acute distress.  She is afeb with stable vital signs.  O2 saturations 100%  on room air.  HEENT:  Unremarkable.  NECK:  Supple without adenopathy.  LUNGS:  Sounds are clear.  CARDIAC:  Regular rate and rhythm.  ABDOMEN:  Soft and nontender.  EXTREMITIES:  Upper extremities warm without any edema.   IMPRESSION AND PLAN:  1. Complete resolution of cough, hoarseness, and atypical asthmatic      symptoms.  The patient's symptoms are totally resolved since recent      sinus surgery and aggressive treatment of reflux and rhinitis symptoms.      The patient is doing very well, tapering off of prednisone, and will      completely taper off of it within the next five days.  She will      continue on her current regimen of reflux and  rhinitis prevention, and      will return here in four weeks totally off of steroids.  At that time,      may consider possibly discontinuing Singulair and/or Zegerid if the      patient is continuing to do well.  2. Complex medication regimen.  The patient's medications are reviewed in      detail.  Patient education was provided.  A computerized medication      calendar was completed for this      patient and reviewed in detail.  The patient is aware to bring this      back at each and every visit.                                  Rubye Oaks, NP  Charlaine Dalton. Sherene Sires, MD, [facility]   TP/MedQ  DD:  08/14/2006 DT:  08/14/2006 Job #:  [id number]

## 2011-04-21 NOTE — Discharge Summary (Signed)
[facility]  Patient:    Andrea Mason, Andrea Mason Visit Number: [id number] MRN: [id number]          Service Type: MED Location: 3W [id number] Attending Physician:  Miguel Aschoff Dictated by:   Gracelyn Nurse, M.D. Admit Date:  09/06/2001 Discharge Date: 09/08/2001   CC:         Janeece Riggers. Dareen Piano, M.D.   Discharge Summary  DISCHARGE DIAGNOSES: 1. Clostridium difficile colitis. 2. Hypokalemia. 3. History of endometriosis. 4. History of hypertension.  DISCHARGE MEDICATIONS: 1. Flagyl 500 mg t.i.d. x 1 week. 2. K-Dur 20 mEq q.d. x 1 week.  HISTORY OF PRESENT ILLNESS:  This is a 60 year old, African-American female with history of recent treatment with antibiotics for endometritis.  She presents to the ED with one-week history of progressive worsening diarrhea. She gives a history of being on multiple courses of antibiotics over the past few weeks.  She has also been on Flagyl.  She states that she finished a one-week course three weeks ago and has been on it weekly since.  PHYSICAL EXAMINATION:  VITAL SIGNS:  Temperature 97.7, pulse 87, respiratory rate 20, blood pressure 122/84.  GENERAL:  This is a well-nourished, African-American female in no acute distress.  HEENT:  Pupils are equal, round and reactive to light, extraocular movements intact.  Oropharynx dry.  Tympanic membranes nonbulging.  NECK:  No JVD, no lymphadenopathy, no thyromegaly.  CARDIOVASCULAR:  Regular rate and rhythm with no murmurs.  LUNGS:  Clear to auscultation.  ABDOMEN:  Obese, mild diffuse tenderness.  Bowel sounds positive and nondistended.  EXTREMITIES:  No edema.  NEUROLOGIC:  Cranial nerves II-XII intact.  No focal deficits.  Strength is 5/5 bilaterally.  REVIEW OF SYSTEMS:  Positive for blurred vision, abdominal tenderness, nausea, joint pain and dysphagia.  LABORATORY DATA AND X-RAY FINDINGS:  White blood cells 10.7, hemoglobin 14.3, platelets 275.   Sodium 138, potassium 2.7, chloride 98, CO2 32, BUN 8, creatinine 0.8, glucose 105.  Urinalysis negative for infection.  Urine pregnancy test negative.  HOSPITAL COURSE:  #1 - CLOSTRIDIUM DIFFICILE COLITIS:  The patient was admitted and hydrated with IV fluids.  She was given Phenergan for nausea. Stool sample was sent to the lab for analysis and came back positive for C. difficile toxin.  The patient was started on Flagyl 500 mg p.o. t.i.d.  She responded to the IV hydration.  She was still having some diarrhea at the time of discharge, but she will be treated with one week of Flagyl t.i.d. as treatment.  She is also instructed to follow up with her primary care M.D. if this does not resolve.  #2 - HISTORY OF HYPERTENSION:  She was mildly hypertensive on admission. However, the last two hospital days she was normotensive.  I instructed her to follow up with her primary care doctor in the long run.  #3 - HYPOKALEMIA:  This is secondary to GI losses and we did replete it. Discharge potassium was 3.1.  We will discharge her on some potassium supplement for the next few days.  DISCHARGE LABORATORY DATA AND X-RAY FINDINGS:  Sodium 139, potassium 3.1, chloride 105, CO2 26, BUN less than 5, creatinine 0.7, glucose 86.  CONDITION ON DISCHARGE:  Stable.  Dictated by:   Gracelyn Nurse, M.D.  Attending Physician:  Miguel Aschoff DD:  09/08/01 TD:  09/09/01 Job: [id number]

## 2011-04-21 NOTE — Op Note (Signed)
Andrea Mason, Andrea Mason              ACCOUNT NO.:  [id number]   MEDICAL RECORD NO.:  [id number]          PATIENT TYPE:  OIB   LOCATION:  2550                         FACILITY:  [facility]   PHYSICIAN:  Kinnie Scales. Annalee Genta, M.D.DATE OF BIRTH:  1951-10-18   DATE OF PROCEDURE:  07/20/2006  DATE OF DISCHARGE:                                 OPERATIVE REPORT   PREOPERATIVE DIAGNOSES:  1. Chronic sinusitis.  2. Nasal polyposis.  3. Inferior turbinate hypertrophy.  4. Reactive airway disease and allergy.   POSTOPERATIVE DIAGNOSES:  1. Chronic sinusitis.  2. Nasal polyposis.  3. Inferior turbinate hypertrophy.  4. Reactive airway disease and allergy.   INDICATIONS FOR PROCEDURE:  1. Chronic sinusitis.  2. Nasal polyposis.  3. Inferior turbinate hypertrophy.  4. Reactive airway disease and allergy.   SURGICAL PROCEDURE:  Bilateral endoscopic sinus surgery with computer-  assisted navigation (Stealth) consisting of bilateral total ethmoidectomy,  bilateral maxillary antrostomy with removal of diseased tissue, bilateral  nasofrontal recess exploration and bilateral sphenoidotomy with removal of  tissue.  1. Bilateral inferior turbinate reduction.   SURGEON:  Kinnie Scales. Annalee Genta, M.D.   ANESTHESIA:  General endotracheal.   COMPLICATIONS:  None.   ESTIMATED BLOOD LOSS:  Approximately 200 mL.   DISPOSITION:  The patient transferred from the operating room to the  recovery room in stable condition.   BRIEF HISTORY:  Andrea Mason is a 60 year old black female who is referred by  her pulmonary physician, Dr. Sherene Sires, for evaluation of chronic sinusitis.  The  patient has had a history of allergies and progressive reactive airway  disease.  Over the last several years, she has had increasing difficulty in  managing asthma issues.  She has been on maximal medication including oral  prednisone and has continued to have chronic problems.  Evaluation through  Dr. Thurston Hole office included a CT scan of  the sinus, which showed complete  opacification of the paranasal sinuses.  She had been treated with multiple  courses of antibiotics, topical and oral steroids and had limited  improvement in sinus function with chronic nasal airway obstruction,  congestion and periorbital pressure symptoms.  Prior to surgical  intervention, a Stealth-formatted CT scan was obtained which showed complete  opacification of the frontal, ethmoid, maxillary and sphenoid sinuses.  Given the patient's history and physical examination, I recommended that we  undertake bilateral endoscopic sinus surgery and the risks, benefits and  possible complications of the surgical procedures were discussed in detail  with the patient and her son and they understood and concurred with our plan  for surgery, which was scheduled for [facility] OR with an  overnight admission.   SURGICAL PROCEDURE:  The patient was brought to the operating room on July 20, 2006 and placed in a supine position on the operating table.  General  endotracheal anesthesia was established without difficulty and the patient  was adequately anesthetized.  Her nasal cavity was examined and injected  with a total of 8 mL of 1% lidocaine and 1:100,000 epinephrine injected  along the lateral aspect of the nasal cavity, middle  and inferior turbinates  and via transoral sphenopalatine injection, local anesthetic was 1%  lidocaine and 1:100,000 epinephrine; a total of 8 mL were injected.  The  patient's nose was then packed with Afrin-soaked cottonoid pledgets, which  were left in place for approximately 10 minutes to allow for  vasoconstriction.  The patient was positioned on the operating table and  then prepped and draped in a sterile fashion.  The Stealth computer-assisted  navigation headgear was applied and anatomic surgical landmarks were  identified and confirmed and the system was used throughout the surgical  procedure.   Surgical  procedure begun on the left-hand side with removal of nasal  packing.  The middle turbinate was gently medialized.  The uncinate process  was reflected medially and resected with through-cutting forceps.  Dissection was then carried out along the floor of the ethmoid sinus,  resecting the ethmoid bulla and dissecting from anterior to posterior.  There was complete opacification of the ethmoid air cells.  Posterior  ethmoid air cells were identified and roof of the ethmoid sinus was  identified along the skull base.  Dissection then carried out from posterior  to anterior using a 45-degree telescope and a curved microdebrider with  stealth navigation guidance.  The entire ethmoid cavity was then cleared of  polypoid disease and bony septations.  The nasofrontal recess was  identified; this was completely obstructed with an underlying ethmoid cell  and polypoid disease.  The nasofrontal recess was then widely opened and  false sinus was explored.  There was minimal disease within the frontal  sinus.  Attention was then turned to the lateral nasal wall where residual  uncinate process was resected and the natural ostium of the maxillary sinus  was enlarged in a posterior and inferior direction.  There was polypoid  disease within the left maxillary sinus, which was resected using a curved  microdebrider, creating a widely patent maxillary antrostomy.  The sphenoid  sinus was then evaluated using the Stealth.  A transethmoidal approach was  undertaken with partial resection of the inferior aspect of the superior  turbinate.  The natural ostium of the sphenoid sinus was identified; this  was gently palpated and then enlarged with a straight microdebrider,  creating a widely patent sphenoid ostium.  There was thick mucoid material  within the left sphenoid sinus and this was sent to Pathology to rule out  allergic fungal mucin.  A surgical procedure was then carried out on the patient's  right-hand side  with a similar procedure.  Medialization of the middle turbinate was  accomplished.  The uncinate process was reflected medially and resected with  through-cutting forceps.  Ethmoidectomy was performed along the floor of the  ethmoid sinus, removing bony septations.  The posterior ethmoid region was  identified and polypoid disease was resected from posterior to anterior  along the roof of the ethmoid sinus using a curved microdebrider and a 45-  degree telescope.  The Stealth navigation tool was used throughout this  portion of the procedure and the skull base was cleared of polypoid mucosa  and bony septations.  Nasofrontal recess was again identified.  The patient  had a large supra-ethmoid bulla cell which extended over the orbit; this was  filled with mucoid material and the ostium was widely opened.  The  nasofrontal recess was anterior to this region and was completely occluded.  Underlying bone was resected, creating a widely patent nasofrontal recess,  which was in a midline position.  Again, mucoid material was aspirated from  within the sinus and polypoid material was resected from its margins,  creating a widely patent nasofrontal recess and superior ethmoid cavity.  Attention was then turned to the lateral nasal wall, where the 45-degree  telescope was used to visualize the natural ostium of the maxillary sinus;  it was enlarged in a posterior and inferior direction.  The sinus contained  polypoid mucosa, which was resected with a curved microdebrider, creating a  widely patent nasofrontal recess.  Attention was then turned the sphenoid  sinus.  On the right-hand side, again a transethmoidal approach was  undertaken.  The patient had a large posterosuperior ethmoid cell with the  sphenoid sinus depressed inferiorly and laterally.  The inferior aspect of  the superior turbinate was partially resected and then dissection was  carried out along the sphenoethmoid  recess, identifying the natural ostium  of the sphenoid sinus and resecting surrounding soft tissue to create a  widely patent sphenoid ostium.  The patient also had a large deflection of  the sphenoid sinus septum which extended into the anterior aspect of the  sphenoid sinus; this was partially resected and the 2 sides of the sphenoid  sinus were communicated.   Attention was then turned to the inferior turbinates, where inferior  turbinate reduction was performed.  Bipolar intramural cautery was used to  create a submucosal cauterization.  Using the needle cautery, 2 submucosal  passes were made in each inferior turbinate.  When the turbinates were  adequately cauterized, they were outfractured to create a more patent nasal  cavity.  The anterior aspect of each inferior turbinate was then incised and  inferior turbinate bone was partially resected, preserving the overlying  mucosa.  The turbinates were then outfractured.  The patient's nasal cavity, nasopharynx, oral cavity and oropharynx were  then irrigated and suctioned.  Sinus endoscopy was then performed and  surgical debris was cleared of all sinuses.  They were thoroughly irrigated,  there was no active bleeding and all packs were removed.  The sinuses were  then instilled with a 50/50 mixture of Kenalog 40 and Bactroban cream, which  was instilled within the frontal ethmoid and maxillary sinuses bilaterally.  Bilateral Kennedy sinus packs were then placed and hydrated with sterile  saline.  An orogastric tube was passed and the stomach contents were  aspirated.  The patient was then awakened from her anesthetic.  She was  extubated and was transferred from the operating room to the recovery room  in stable condition.  There were no complications and blood loss was  approximately 200 mL.           ______________________________  Kinnie Scales. Annalee Genta, M.D.     DLS/MEDQ  D:  04/54/0981  T:  07/20/2006  Job:  [id number]

## 2011-04-21 NOTE — Assessment & Plan Note (Signed)
[facility]                             PULMONARY OFFICE NOTE   NAME:Mercier, [facility]                       MRN:          [id number]  DATE:04/03/2007                            DOB:          [birth year]    HISTORY OF PRESENT ILLNESS:  The patient is a 60 year old African-  American female patient of Dr. Thurston Hole who has a known history of chronic  rhinitis and asthma who presents today for an acute office visit. The  patient complains of increased nasal congestion and stuffiness that has  worsened over the last two months and has progressively worsened this  past week. She complains of thick green nasal discharge, sinus pain and  pressure and right eye matting. The patient denies any hemoptysis,  orthopnea, PND or leg swelling.   PAST MEDICAL HISTORY:  Reviewed.   CURRENT MEDICATIONS:  Reviewed.   PHYSICAL EXAMINATION:  The patient is a pleasant female in no acute  distress. She is afebrile with stable vital signs. O2 saturation is 99%  on room air.  HEENT:  Nasal mucosa is erythematous. Maxillary sinus tenderness.  Posterior pharynx is clear.  NECK:  Is supple without adenopathy. No JVD.  LUNGS:  Sounds reveal coarse breath sounds bilaterally without any  wheezing.  CARDIAC:  Regular rate.  ABDOMEN:  Soft and nontender.  EXTREMITIES:  Warm without any edema.   IMPRESSION AND PLAN:  Acute rhinosinusitis. The patient was given  Omnicef for 10 days. Nasal hygiene regimen with saline, Afrin and  Rhinocort Aqua. The patient is to use Mucinex DM for cough and  congestion. The patient will also use tramadol as needed for  breakthrough coughing. The patient will return back to Dr. Sherene Sires in one  month or sooner if needed.      Rubye Oaks, NP  Electronically Signed      Charlaine Dalton. Sherene Sires, MD, [facility]  Electronically Signed   TP/MedQ  DD: 04/03/2007  DT: 04/03/2007  Job #: [id number]

## 2011-04-25 ENCOUNTER — Ambulatory Visit (INDEPENDENT_AMBULATORY_CARE_PROVIDER_SITE_OTHER): Payer: Self-pay

## 2011-04-25 DIAGNOSIS — J309 Allergic rhinitis, unspecified: Secondary | ICD-10-CM

## 2011-04-28 ENCOUNTER — Ambulatory Visit (INDEPENDENT_AMBULATORY_CARE_PROVIDER_SITE_OTHER): Payer: Self-pay

## 2011-04-28 DIAGNOSIS — J309 Allergic rhinitis, unspecified: Secondary | ICD-10-CM

## 2011-05-03 ENCOUNTER — Ambulatory Visit (INDEPENDENT_AMBULATORY_CARE_PROVIDER_SITE_OTHER): Payer: Self-pay

## 2011-05-03 DIAGNOSIS — J309 Allergic rhinitis, unspecified: Secondary | ICD-10-CM

## 2011-05-05 ENCOUNTER — Ambulatory Visit (INDEPENDENT_AMBULATORY_CARE_PROVIDER_SITE_OTHER): Payer: Self-pay

## 2011-05-05 DIAGNOSIS — J309 Allergic rhinitis, unspecified: Secondary | ICD-10-CM

## 2011-05-08 ENCOUNTER — Encounter: Payer: Self-pay | Admitting: Internal Medicine

## 2011-05-09 ENCOUNTER — Ambulatory Visit (INDEPENDENT_AMBULATORY_CARE_PROVIDER_SITE_OTHER): Payer: Self-pay

## 2011-05-09 DIAGNOSIS — J309 Allergic rhinitis, unspecified: Secondary | ICD-10-CM

## 2011-05-12 ENCOUNTER — Other Ambulatory Visit: Payer: Self-pay | Admitting: Internal Medicine

## 2011-05-12 MED ORDER — ZAFIRLUKAST 20 MG PO TABS: 20.0000 mg | ORAL_TABLET | Freq: Two times a day (BID) | ORAL | Status: DC

## 2011-05-15 ENCOUNTER — Telehealth: Payer: Self-pay

## 2011-05-15 NOTE — Telephone Encounter (Signed)
Called the patient and she does not want to schedule Chest CT as is out of work and has no insurance. She was on potassium and would like to know at [facility] what would be the equivalent to what her rx was?

## 2011-05-15 NOTE — Telephone Encounter (Signed)
Called the patient left message to call back 

## 2011-05-15 NOTE — Telephone Encounter (Signed)
Noted  I dont know of any OTC med that would be the same as the prescriptoin potssium

## 2011-05-15 NOTE — Telephone Encounter (Signed)
Informed patient

## 2011-05-15 NOTE — Telephone Encounter (Signed)
Message copied by Pincus Sanes on Mon May 15, 2011  9:20 AM ------      Message from: Anselm Jungling      Created: Mon May 15, 2011  8:02 AM      Regarding: FW: chest ct f/u                   ----- Message -----         From: Oliver Barre, MD         Sent: 05/14/2011   8:19 PM           To: Margaret Pyle, CMA      Subject: chest ct f/u                                             Please call pt - she is due for f/u chest ct;  Ask if ok to order this

## 2011-05-16 ENCOUNTER — Ambulatory Visit (INDEPENDENT_AMBULATORY_CARE_PROVIDER_SITE_OTHER): Payer: Self-pay

## 2011-05-16 DIAGNOSIS — J309 Allergic rhinitis, unspecified: Secondary | ICD-10-CM

## 2011-05-17 ENCOUNTER — Telehealth: Payer: Self-pay | Admitting: Internal Medicine

## 2011-05-17 NOTE — Telephone Encounter (Signed)
Pt has been waiting for pcc to have her financial info (that she brought in and gave them copies "several times") faxed to [employer]. This is re: QVAR/ PROAIR and OMEPRAZOL- pt says they also need the rx for omeprazol faxed as well to the attn: shay [phone]. Pt says that this has been taking "far too long" and would like this to be taken care of asap.

## 2011-05-17 NOTE — Telephone Encounter (Signed)
Spoke to pt made her aware these drug assistance program forms have been faxed but i did refax the az&me forms to the fax# for shay

## 2011-05-19 ENCOUNTER — Ambulatory Visit (INDEPENDENT_AMBULATORY_CARE_PROVIDER_SITE_OTHER): Payer: Self-pay

## 2011-05-19 DIAGNOSIS — J309 Allergic rhinitis, unspecified: Secondary | ICD-10-CM

## 2011-05-22 ENCOUNTER — Other Ambulatory Visit: Payer: Self-pay | Admitting: Internal Medicine

## 2011-05-22 ENCOUNTER — Telehealth: Payer: Self-pay | Admitting: Internal Medicine

## 2011-05-22 ENCOUNTER — Ambulatory Visit (INDEPENDENT_AMBULATORY_CARE_PROVIDER_SITE_OTHER): Payer: Self-pay

## 2011-05-22 DIAGNOSIS — J309 Allergic rhinitis, unspecified: Secondary | ICD-10-CM

## 2011-05-22 MED ORDER — OMEPRAZOLE 20 MG PO CPDR: DELAYED_RELEASE_CAPSULE | ORAL | Status: DC

## 2011-05-22 NOTE — Telephone Encounter (Signed)
Called and spoke with pt and she stated that she didn't know if she could still see CY due to her not having any insurance.  She stated that her eye is swollen and there is a bump on her nose but pt has not had any trauma to her eye or nose.  She did use eye drops last night and today that did help some but eye is still painful and swollen.,  Pt also c/o nasal congestion that is clear.  Please advise. thanks

## 2011-05-23 NOTE — Telephone Encounter (Signed)
Pt aware and states she will call her PCP. Carron Curie, CMA

## 2011-05-23 NOTE — Telephone Encounter (Signed)
Per CY-we will be glad to see her but will have to be worked in on Wednesday at 845am to see CY-BUT CY feels like this should be directed towards her PCP.

## 2011-05-24 ENCOUNTER — Encounter: Payer: Self-pay | Admitting: Internal Medicine

## 2011-05-24 ENCOUNTER — Ambulatory Visit (INDEPENDENT_AMBULATORY_CARE_PROVIDER_SITE_OTHER): Payer: Self-pay | Admitting: Internal Medicine

## 2011-05-24 DIAGNOSIS — H109 Unspecified conjunctivitis: Secondary | ICD-10-CM

## 2011-05-24 DIAGNOSIS — Z0001 Encounter for general adult medical examination with abnormal findings: Secondary | ICD-10-CM | POA: Insufficient documentation

## 2011-05-24 DIAGNOSIS — I10 Essential (primary) hypertension: Secondary | ICD-10-CM

## 2011-05-24 DIAGNOSIS — H0019 Chalazion unspecified eye, unspecified eyelid: Secondary | ICD-10-CM

## 2011-05-24 DIAGNOSIS — H0015 Chalazion left lower eyelid: Secondary | ICD-10-CM | POA: Insufficient documentation

## 2011-05-24 DIAGNOSIS — R350 Frequency of micturition: Secondary | ICD-10-CM

## 2011-05-24 DIAGNOSIS — Z0000 Encounter for general adult medical examination without abnormal findings: Secondary | ICD-10-CM | POA: Insufficient documentation

## 2011-05-24 MED ORDER — DOXYCYCLINE HYCLATE 100 MG PO TABS: 100.0000 mg | ORAL_TABLET | Freq: Two times a day (BID) | ORAL | Status: AC

## 2011-05-24 NOTE — Patient Instructions (Signed)
OK to use the tobramycin eye drops you have at 2 drops four times per day both eyes for 10 days Take all new medications as prescribed - the doxycycline Continue all other medications as before Your blood sugar was 84 today - no need further testing at this time Please keep your appointments with your specialists as you have planned - the allergy shots twice weekly, and the opthamologist to see about the left lower eyelid cyst

## 2011-05-24 NOTE — Assessment & Plan Note (Addendum)
Improved with wt loss, no need further BP meds at this time  BP Readings from Last 3 Encounters:  05/24/11 122/82  03/09/11 112/68  02/09/11 118/72

## 2011-05-24 NOTE — Progress Notes (Signed)
Subjective:    Patient ID: Andrea Mason, female    DOB: 1951/11/02, 60 y.o.   MRN: [id number]  HPI  Here with acute onset 3 days bilat eye drainage, with low grade temp, HA, general weakness and malaise, as well as new unusual lump to the left lower eyelid with tenderness, nasal drainage and ST, but Pt denies chest pain, increased sob or doe, wheezing, orthopnea, PND, increased LE swelling, palpitations, dizziness or syncope.  Pt denies new neurological symptoms such as new headache, or facial or extremity weakness or numbness   Pt denies polydipsia, polyuria, though has had some vague urinary freq intermittent in the past wk, and hx of "slight elev sugar" in the past.   Denies urinary symptoms such as dysuria,  urgency,or hematuria. No blurred vision or eye pain. Has lost over 30 lbs in the past 2 yrs and now off BP meds (really b/c ran out 3 mo ago due to tight finances, but may have had some dizziness as well) Past Medical History  Diagnosis Date  . Hypertension   . GERD (gastroesophageal reflux disease)   . Allergic rhinitis   . Asthma   . Obesity   . Depression   . History of colonic polyps   . DJD (degenerative joint disease), cervical    Past Surgical History  Procedure Date  . Nasal sinus surgery 07/2006    Dr. Annalee Genta  . Nasal polyp surgery 07/2006    x 2 with Dr. Annalee Genta  . Foot surgery 1998  . Tubal ligation     reports that she has never smoked. She does not have any smokeless tobacco history on file. She reports that she does not drink alcohol or use illicit drugs. family history is not on file. Allergies  Allergen Reactions  . Phenergan-Codeine    Current Outpatient Prescriptions on File Prior to Visit  Medication Sig Dispense Refill  . albuterol (PROAIR HFA) 108 (90 BASE) MCG/ACT inhaler Inhale 2 puffs into the lungs every 6 (six) hours as needed.        . beclomethasone (QVAR) 40 MCG/ACT inhaler Inhale 2 puffs into the lungs 2 (two) times daily.        .  fluticasone (FLONASE) 50 MCG/ACT nasal spray 2 sprays by Nasal route 2 (two) times daily.        Marland Kitchen omeprazole (PRILOSEC) 20 MG capsule Take one tablet by mouth two times daily 30 minutes prior to a meal  60 capsule  prn  . oxymetazoline (AFRIN) 0.05 % nasal spray 2 sprays by Nasal route 2 (two) times daily. For 5 days only       . pseudoephedrine-guaifenesin (MUCINEX D) 60-600 MG per tablet Take 1-2 tablets by mouth two times daily as needed for congestion and sinus pressure       . sorbitol 70 % solution Take 30 mLs by mouth daily as needed.        Marland Kitchen DISCONTD: Ascorbic Acid (VITAMIN C) 500 MG tablet Take 500 mg by mouth daily.        Marland Kitchen DISCONTD: Biotin 1000 MCG tablet Take 1,000 mcg by mouth 3 (three) times daily.        Marland Kitchen DISCONTD: cetirizine (ZYRTEC) 10 MG chewable tablet Chew 10 mg by mouth daily.        Marland Kitchen DISCONTD: cyclobenzaprine (FLEXERIL) 10 MG tablet Take 10 mg by mouth 3 (three) times daily as needed.        Marland Kitchen DISCONTD: diltiazem (CARDIZEM CD) 240 MG 24 hr  capsule TAKE 1 CAPSULE BY MOUTH ONCE DAILY  30 capsule  11  . DISCONTD: diltiazem (DILACOR XR) 240 MG 24 hr capsule Take 240 mg by mouth daily.        Marland Kitchen DISCONTD: diphenhydrAMINE (SOMINEX) 25 MG tablet Take 25 mg by mouth at bedtime as needed. For drippy nose and itching       . DISCONTD: hydrochlorothiazide 25 MG tablet Take 25 mg by mouth daily.        Marland Kitchen DISCONTD: potassium chloride (KLOR-CON) 10 MEQ CR tablet Take 10 mEq by mouth 4 (four) times daily.        Marland Kitchen DISCONTD: ranitidine (ZANTAC) 150 MG capsule Take 150 mg by mouth at bedtime as needed.        Marland Kitchen DISCONTD: sodium chloride (OCEAN) 0.65 % nasal spray 1 spray by Nasal route as needed.        Marland Kitchen DISCONTD: traMADol (ULTRAM) 50 MG tablet TAKE 1 TO 2 TABLETS BY MOUTH EVERY 4 TO 6 HOURS FOR COUGH  40 tablet  0  . DISCONTD: zafirlukast (ACCOLATE) 20 MG tablet Take 1 tablet (20 mg total) by mouth 2 (two) times daily.  180 tablet  3  . DISCONTD: zinc gluconate 50 MG tablet Take 50 mg by  mouth daily.         aReview of Systems Review of Systems  Constitutional: Negative for diaphoresis and unexpected weight change.  HENT: Negative for drooling and tinnitus.   Eyes: Negative for photophobia and visual disturbance.  Respiratory: Negative for choking and stridor.   Gastrointestinal: Negative for vomiting and blood in stool.  Genitourinary: Negative for hematuria and decreased urine volume.   Objective:   Physical Exam BP 122/82  Pulse 75  Temp(Src) 97.4 F (36.3 C) (Oral)  Ht 5\' 1"  (1.549 m)  Wt 203 lb 12 oz (92.42 kg)  BMI 38.50 kg/m2  SpO2 96% Physical Exam  VS noted, mild ill Constitutional: Pt appears well-developed and well-nourished.  HENT: Head: Normocephalic. Bilat tm's mild erythema.  Sinus nontender.  Pharynx mild erythema Bilat conjucnt with mild erythema, slightly cloudy drainage  Right Ear: External ear normal.  Left Ear: External ear normal.  Eyes: Conjunctivae and EOM are normal. Pupils are equal, round, and reactive to light.  Neck: Normal range of motion. Neck supple.  Cardiovascular: Normal rate and regular rhythm.   Pulmonary/Chest: Effort normal and breath sounds normal.  Abd:  Soft, NT, non-distended, + BS Neurological: Pt is alert. No cranial nerve deficit.  Skin: Skin is warm. No erythema.  Psychiatric: Pt behavior is normal. Thought content normal.         Assessment & Plan:

## 2011-05-24 NOTE — Assessment & Plan Note (Signed)
With nocturia x 3-4 times per night,  For CBG today, if elev will need a1c (pt wants to avoid other labs due to cost/lack of insurance)

## 2011-05-24 NOTE — Assessment & Plan Note (Addendum)
Chalazion vs blepharitis - for doxy course,  to f/u any worsening symptoms or concerns, pt plans to see opthamology in July for regular visit

## 2011-05-24 NOTE — Assessment & Plan Note (Signed)
Mild to mod, ? Viral vs other, has tobramycin at home "leftover", ok to use 2 gtts qid for 10 days

## 2011-05-26 ENCOUNTER — Ambulatory Visit (INDEPENDENT_AMBULATORY_CARE_PROVIDER_SITE_OTHER): Payer: Self-pay

## 2011-05-26 DIAGNOSIS — J309 Allergic rhinitis, unspecified: Secondary | ICD-10-CM

## 2011-05-29 ENCOUNTER — Ambulatory Visit (INDEPENDENT_AMBULATORY_CARE_PROVIDER_SITE_OTHER): Payer: Self-pay

## 2011-05-29 DIAGNOSIS — J309 Allergic rhinitis, unspecified: Secondary | ICD-10-CM

## 2011-06-02 ENCOUNTER — Ambulatory Visit (INDEPENDENT_AMBULATORY_CARE_PROVIDER_SITE_OTHER): Payer: Self-pay

## 2011-06-02 DIAGNOSIS — J309 Allergic rhinitis, unspecified: Secondary | ICD-10-CM

## 2011-06-06 ENCOUNTER — Ambulatory Visit (INDEPENDENT_AMBULATORY_CARE_PROVIDER_SITE_OTHER): Payer: Self-pay

## 2011-06-06 DIAGNOSIS — J309 Allergic rhinitis, unspecified: Secondary | ICD-10-CM

## 2011-06-12 ENCOUNTER — Encounter: Payer: Self-pay | Admitting: Internal Medicine

## 2011-06-13 ENCOUNTER — Ambulatory Visit (INDEPENDENT_AMBULATORY_CARE_PROVIDER_SITE_OTHER): Payer: Self-pay

## 2011-06-13 DIAGNOSIS — J309 Allergic rhinitis, unspecified: Secondary | ICD-10-CM

## 2011-06-16 ENCOUNTER — Ambulatory Visit (INDEPENDENT_AMBULATORY_CARE_PROVIDER_SITE_OTHER): Payer: Self-pay

## 2011-06-16 DIAGNOSIS — J309 Allergic rhinitis, unspecified: Secondary | ICD-10-CM

## 2011-06-22 ENCOUNTER — Ambulatory Visit (INDEPENDENT_AMBULATORY_CARE_PROVIDER_SITE_OTHER): Payer: Self-pay

## 2011-06-22 DIAGNOSIS — J309 Allergic rhinitis, unspecified: Secondary | ICD-10-CM

## 2011-06-27 ENCOUNTER — Ambulatory Visit (INDEPENDENT_AMBULATORY_CARE_PROVIDER_SITE_OTHER): Payer: Self-pay

## 2011-06-27 DIAGNOSIS — J309 Allergic rhinitis, unspecified: Secondary | ICD-10-CM

## 2011-06-29 ENCOUNTER — Ambulatory Visit (INDEPENDENT_AMBULATORY_CARE_PROVIDER_SITE_OTHER): Payer: Self-pay

## 2011-06-29 DIAGNOSIS — J309 Allergic rhinitis, unspecified: Secondary | ICD-10-CM

## 2011-06-30 ENCOUNTER — Ambulatory Visit (INDEPENDENT_AMBULATORY_CARE_PROVIDER_SITE_OTHER): Payer: Self-pay

## 2011-06-30 DIAGNOSIS — J309 Allergic rhinitis, unspecified: Secondary | ICD-10-CM

## 2011-07-04 ENCOUNTER — Ambulatory Visit (INDEPENDENT_AMBULATORY_CARE_PROVIDER_SITE_OTHER): Payer: Self-pay

## 2011-07-04 DIAGNOSIS — J309 Allergic rhinitis, unspecified: Secondary | ICD-10-CM

## 2011-07-10 ENCOUNTER — Ambulatory Visit (INDEPENDENT_AMBULATORY_CARE_PROVIDER_SITE_OTHER): Payer: Self-pay

## 2011-07-10 DIAGNOSIS — J309 Allergic rhinitis, unspecified: Secondary | ICD-10-CM

## 2011-07-17 ENCOUNTER — Ambulatory Visit (INDEPENDENT_AMBULATORY_CARE_PROVIDER_SITE_OTHER): Payer: Self-pay

## 2011-07-17 DIAGNOSIS — J309 Allergic rhinitis, unspecified: Secondary | ICD-10-CM

## 2011-07-20 ENCOUNTER — Ambulatory Visit (INDEPENDENT_AMBULATORY_CARE_PROVIDER_SITE_OTHER): Payer: Self-pay

## 2011-07-20 ENCOUNTER — Other Ambulatory Visit: Payer: Self-pay | Admitting: Internal Medicine

## 2011-07-20 DIAGNOSIS — J309 Allergic rhinitis, unspecified: Secondary | ICD-10-CM

## 2011-07-24 ENCOUNTER — Ambulatory Visit (INDEPENDENT_AMBULATORY_CARE_PROVIDER_SITE_OTHER): Payer: Self-pay

## 2011-07-24 DIAGNOSIS — J309 Allergic rhinitis, unspecified: Secondary | ICD-10-CM

## 2011-07-25 ENCOUNTER — Other Ambulatory Visit: Payer: Self-pay | Admitting: *Deleted

## 2011-07-27 ENCOUNTER — Ambulatory Visit (INDEPENDENT_AMBULATORY_CARE_PROVIDER_SITE_OTHER): Payer: Self-pay

## 2011-07-27 DIAGNOSIS — J309 Allergic rhinitis, unspecified: Secondary | ICD-10-CM

## 2011-08-08 ENCOUNTER — Ambulatory Visit (INDEPENDENT_AMBULATORY_CARE_PROVIDER_SITE_OTHER): Payer: Self-pay

## 2011-08-08 DIAGNOSIS — J309 Allergic rhinitis, unspecified: Secondary | ICD-10-CM

## 2011-08-09 ENCOUNTER — Encounter: Payer: Self-pay | Admitting: Internal Medicine

## 2011-08-09 ENCOUNTER — Ambulatory Visit (INDEPENDENT_AMBULATORY_CARE_PROVIDER_SITE_OTHER): Payer: Self-pay | Admitting: Internal Medicine

## 2011-08-09 ENCOUNTER — Ambulatory Visit (INDEPENDENT_AMBULATORY_CARE_PROVIDER_SITE_OTHER)
Admission: RE | Admit: 2011-08-09 | Discharge: 2011-08-09 | Disposition: A | Discharge status: Home / Self Care | Payer: Self-pay | Source: Ambulatory Visit | Attending: Internal Medicine | Admitting: Internal Medicine

## 2011-08-09 DIAGNOSIS — J45909 Unspecified asthma, uncomplicated: Secondary | ICD-10-CM

## 2011-08-09 DIAGNOSIS — M25511 Pain in right shoulder: Secondary | ICD-10-CM | POA: Insufficient documentation

## 2011-08-09 DIAGNOSIS — R079 Chest pain, unspecified: Secondary | ICD-10-CM | POA: Insufficient documentation

## 2011-08-09 DIAGNOSIS — M25519 Pain in unspecified shoulder: Secondary | ICD-10-CM

## 2011-08-09 DIAGNOSIS — J309 Allergic rhinitis, unspecified: Secondary | ICD-10-CM

## 2011-08-09 MED ORDER — NAPROXEN 500 MG PO TABS: 500.0000 mg | ORAL_TABLET | Freq: Two times a day (BID) | ORAL | Status: DC

## 2011-08-09 MED ORDER — OMEPRAZOLE 20 MG PO CPDR: 20.0000 mg | DELAYED_RELEASE_CAPSULE | Freq: Every day | ORAL | Status: DC

## 2011-08-09 MED ORDER — BECLOMETHASONE DIPROPIONATE 80 MCG/ACT IN AERS: 1.0000 | INHALATION_SPRAY | RESPIRATORY_TRACT | Status: DC | PRN

## 2011-08-09 MED ORDER — METHYLPREDNISOLONE ACETATE 80 MG/ML IJ SUSP
120.0000 mg | Freq: Once | INTRAMUSCULAR | Status: AC
  Administered 2011-08-09: 120 mg via INTRAMUSCULAR

## 2011-08-09 NOTE — Assessment & Plan Note (Signed)
Mild uncontrolled, for depomedrol IM today, and incrased qvar asd,  to f/u any worsening symptoms or concerns

## 2011-08-09 NOTE — Patient Instructions (Signed)
You had the steroid shot today Take all new medications as prescribed - the naproxen, and higher strength qvar Continue all other medications as before Please go to XRAY in the Basement for the x-ray test Please call the phone number [phone] (the [employer] System) for results of testing in 2-3 days;  When calling, simply dial the number, and when prompted enter the MRN number above (the Medical Record Number) and the # key, then the message should start.

## 2011-08-09 NOTE — Assessment & Plan Note (Signed)
For depomedrom IM today, and cont meds as is

## 2011-08-09 NOTE — Progress Notes (Signed)
Subjective:    Patient ID: Andrea Mason, female    DOB: Aug 19, 1951, 60 y.o.   MRN: [id number]  HPI here with 2 wk onset arm, hands, shoulders and finger pain;  No neck pain, but did have some yrs ago, and has seen Dr Luiz Blare at [facility] ortho; has been unemployed since April, and no other physical work except she did move to a new apartment.  No swelling except for mild bilat ankle swelling, none today actually.  Pt denies chest pain, increased sob or doe, wheezing, orthopnea, PND, increased LE swelling, palpitations, dizziness or syncope, except for occasional fleeting sharp anterior chest pains intermittent for the last few wks.  Pt denies new neurological symptoms such as new headache, or facial or extremity weakness or numbness   Pt denies polydipsia, polyuria,   Pt states overall good compliance with meds, trying to follow lower cholesterol, diabetic diet, wt overall stable but little exercise however.   Does have several wks ongoing nasal allergy symptoms with clear congestion, itch and sneeze, without fever, pain, ST, cough or wheezing, except for 2 mo recurring nighttime awakenings with wheezing. Past Medical History  Diagnosis Date  . Hypertension   . GERD (gastroesophageal reflux disease)   . Allergic rhinitis   . Asthma   . Obesity   . Depression   . History of colonic polyps   . DJD (degenerative joint disease), cervical    Past Surgical History  Procedure Date  . Nasal sinus surgery 07/2006    Dr. Annalee Genta  . Nasal polyp surgery 07/2006    x 2 with Dr. Annalee Genta  . Foot surgery 1998  . Tubal ligation     reports that she has never smoked. She does not have any smokeless tobacco history on file. She reports that she does not drink alcohol or use illicit drugs. family history is not on file. Allergies  Allergen Reactions  . Phenergan-Codeine    Current Outpatient Prescriptions on File Prior to Visit  Medication Sig Dispense Refill  . albuterol (PROAIR HFA) 108 (90 BASE)  MCG/ACT inhaler Inhale 2 puffs into the lungs every 6 (six) hours as needed.        . fluticasone (FLONASE) 50 MCG/ACT nasal spray SPRAY 2 SPRAYS IN EACH NOSTRIL EVERY NIGHT AT BEDTIME  1 Act  5  . omeprazole (PRILOSEC) 20 MG capsule Take one tablet by mouth two times daily 30 minutes prior to a meal  60 capsule  prn  . oxymetazoline (AFRIN) 0.05 % nasal spray 2 sprays by Nasal route 2 (two) times daily. For 5 days only       . pseudoephedrine-guaifenesin (MUCINEX D) 60-600 MG per tablet Take 1-2 tablets by mouth two times daily as needed for congestion and sinus pressure       . sorbitol 70 % solution Take 30 mLs by mouth daily as needed.        . NON FORMULARY Patient receives allergy shots 2 times per week        No current facility-administered medications on file prior to visit.   Review of Systems Review of Systems  Constitutional: Negative for diaphoresis and unexpected weight change.  HENT: Negative for drooling and tinnitus.   Eyes: Negative for photophobia and visual disturbance.  Respiratory: Negative for choking and stridor.   Gastrointestinal: Negative for vomiting and blood in stool.  Genitourinary: Negative for hematuria and decreased urine volume.  Musculoskeletal: Negative for gait problem.  Skin: Negative for color change and wound.  Neurological:  Negative for tremors and numbness.  Psychiatric/Behavioral: Negative for decreased concentration. The patient is not hyperactive.       Objective:   Physical Exam BP 136/90  Pulse 79  Temp(Src) 97.7 F (36.5 C) (Oral)  Ht 5\' 1"  (1.549 m)  Wt 211 lb 4 oz (95.822 kg)  BMI 39.92 kg/m2  SpO2 98% Physical Exam  VS noted Constitutional: Pt appears well-developed and well-nourished.  HENT: Head: Normocephalic.  Right Ear: External ear normal.  Left Ear: External ear normal.  Eyes: Conjunctivae and EOM are normal. Pupils are equal, round, and reactive to light.  Neck: Normal range of motion. Neck supple.  Cardiovascular:  Normal rate and regular rhythm.   Bilat tm's mild erythema.  Sinus nontender.  Pharynx mild erythema Pulmonary/Chest: Effort normal and breath sounds decreased with trace wheeze.  Abd:  Soft, NT, non-distended, + BS Neurological: Pt is alert. No cranial nerve deficit.  Skin: Skin is warm. No erythema.  Psychiatric: Pt behavior is normal. Thought content normal. 1+ nervous Right shoulder with mild tender subacromial, but o/w NT and FROM Left shoulder with decreased ROM c/w known left rotater cuff chronic disease       Assessment & Plan:

## 2011-08-09 NOTE — Assessment & Plan Note (Signed)
C/w mild bursitis subacromial, for depomedrol IM today as per asthma,  to f/u any worsening symptoms or concerns, for prn nsaiad as well

## 2011-08-09 NOTE — Assessment & Plan Note (Signed)
Most c/w MSK I suspect, ECG reviewed - sinus o/w norma; - no acute change; also for CXR as above

## 2011-08-23 ENCOUNTER — Ambulatory Visit (INDEPENDENT_AMBULATORY_CARE_PROVIDER_SITE_OTHER): Payer: Self-pay

## 2011-08-23 DIAGNOSIS — J309 Allergic rhinitis, unspecified: Secondary | ICD-10-CM

## 2011-09-01 ENCOUNTER — Ambulatory Visit (INDEPENDENT_AMBULATORY_CARE_PROVIDER_SITE_OTHER): Payer: Self-pay

## 2011-09-01 DIAGNOSIS — J309 Allergic rhinitis, unspecified: Secondary | ICD-10-CM

## 2011-09-06 LAB — POCT I-STAT, CHEM 8
BUN: 10
Creatinine, Ser: 1
Glucose, Bld: 112 — ABNORMAL HIGH
Hemoglobin: 13.6
TCO2: 33

## 2011-09-06 LAB — CBC
HCT: 38.6
Hemoglobin: 13.2
MCV: 89.3
RBC: 4.32
WBC: 5.2

## 2011-09-06 LAB — DIFFERENTIAL
Eosinophils Absolute: 0.7
Eosinophils Relative: 14 — ABNORMAL HIGH
Lymphocytes Relative: 29
Lymphs Abs: 1.5
Monocytes Absolute: 0.7
Monocytes Relative: 13 — ABNORMAL HIGH

## 2011-09-11 ENCOUNTER — Encounter: Payer: Self-pay | Admitting: Internal Medicine

## 2011-09-12 ENCOUNTER — Ambulatory Visit: Payer: Managed Care, Other (non HMO) | Admitting: Internal Medicine

## 2011-09-15 ENCOUNTER — Ambulatory Visit (INDEPENDENT_AMBULATORY_CARE_PROVIDER_SITE_OTHER): Payer: Self-pay

## 2011-09-15 DIAGNOSIS — J309 Allergic rhinitis, unspecified: Secondary | ICD-10-CM

## 2011-10-05 ENCOUNTER — Ambulatory Visit (INDEPENDENT_AMBULATORY_CARE_PROVIDER_SITE_OTHER): Payer: Self-pay

## 2011-10-05 DIAGNOSIS — J309 Allergic rhinitis, unspecified: Secondary | ICD-10-CM

## 2011-10-09 ENCOUNTER — Emergency Department (HOSPITAL_COMMUNITY): Payer: Self-pay

## 2011-10-09 ENCOUNTER — Encounter (HOSPITAL_COMMUNITY): Payer: Self-pay | Admitting: *Deleted

## 2011-10-09 ENCOUNTER — Telehealth: Payer: Self-pay | Admitting: *Deleted

## 2011-10-09 ENCOUNTER — Emergency Department (HOSPITAL_COMMUNITY)
Admission: EM | Admit: 2011-10-09 | Discharge: 2011-10-09 | Disposition: A | Discharge status: Home / Self Care | Payer: Self-pay | Attending: Emergency Medicine | Admitting: Emergency Medicine

## 2011-10-09 DIAGNOSIS — S0990XA Unspecified injury of head, initial encounter: Secondary | ICD-10-CM | POA: Insufficient documentation

## 2011-10-09 DIAGNOSIS — J3489 Other specified disorders of nose and nasal sinuses: Secondary | ICD-10-CM | POA: Insufficient documentation

## 2011-10-09 DIAGNOSIS — R404 Transient alteration of awareness: Secondary | ICD-10-CM | POA: Insufficient documentation

## 2011-10-09 DIAGNOSIS — R42 Dizziness and giddiness: Secondary | ICD-10-CM | POA: Insufficient documentation

## 2011-10-09 DIAGNOSIS — H538 Other visual disturbances: Secondary | ICD-10-CM | POA: Insufficient documentation

## 2011-10-09 DIAGNOSIS — R51 Headache: Secondary | ICD-10-CM | POA: Insufficient documentation

## 2011-10-09 DIAGNOSIS — W010XXA Fall on same level from slipping, tripping and stumbling without subsequent striking against object, initial encounter: Secondary | ICD-10-CM | POA: Insufficient documentation

## 2011-10-09 DIAGNOSIS — I10 Essential (primary) hypertension: Secondary | ICD-10-CM | POA: Insufficient documentation

## 2011-10-09 NOTE — ED Notes (Signed)
Discharged but waiting for charge calculator to be completed due to computer difficulty, discharged at [phone]

## 2011-10-09 NOTE — ED Provider Notes (Signed)
Medical screening examination/treatment/procedure(s) were performed by non-physician practitioner and as supervising physician I was immediately available for consultation/collaboration.   Lyanne Co, MD 10/09/11 [phone]

## 2011-10-09 NOTE — Telephone Encounter (Signed)
Harriett Sine to call pt - ok for OV, any MD

## 2011-10-09 NOTE — ED Notes (Signed)
Pt reports falling this am, fell  Backwards striking back of head. C/o HA. Dizziness has resolved c/o feeling tired.

## 2011-10-09 NOTE — ED Provider Notes (Signed)
History     CSN: [id number] Arrival date & time: 10/09/2011  1:13 PM   First MD Initiated Contact with Patient 10/09/11 1337      Chief Complaint  Patient presents with  . Fall    (Consider location/radiation/quality/duration/timing/severity/associated sxs/prior treatment) Fall The accident occurred 1 to 2 hours ago. The fall occurred while standing (son pulled on pants leg trying to tie pt shoe and made her lose her balance). She fell from a height of 3 to 5 ft. She landed on a hard floor. There was no blood loss. The point of impact was the head. The pain is present in the head. The pain is moderate. She was ambulatory at the scene. There was no entrapment after the fall. There was no drug use involved in the accident. There was no alcohol use involved in the accident. Associated symptoms include headaches. Pertinent negatives include no visual change, no fever, no numbness, no abdominal pain, no bowel incontinence, no nausea, no vomiting, no hearing loss, no loss of consciousness and no tingling. Associated symptoms comments: Positive drowsiness, transient dizziness and blurred vision that have resolved by the time of examination. Exacerbated by: nothing. Prehospitalization: nothing. She has tried nothing for the symptoms.  Pt is not on any blood thinners.  Past Medical History  Diagnosis Date  . Hypertension   . GERD (gastroesophageal reflux disease)   . Allergic rhinitis   . Asthma   . Obesity   . Depression   . History of colonic polyps   . DJD (degenerative joint disease), cervical     Past Surgical History  Procedure Date  . Nasal sinus surgery 07/2006    Dr. Annalee Genta  . Nasal polyp surgery 07/2006    x 2 with Dr. Annalee Genta  . Foot surgery 1998  . Tubal ligation     No family history on file.  History  Substance Use Topics  . Smoking status: Never Smoker   . Smokeless tobacco: Not on file  . Alcohol Use: No    OB History    Grav Para Term Preterm Abortions TAB  SAB Ect Mult Living                  Review of Systems  Constitutional: Negative for fever, diaphoresis and fatigue.  HENT: Negative for hearing loss, nosebleeds, neck pain and tinnitus.   Eyes: Negative for pain.       Positive transient blurred vision after fall, resolved  Respiratory: Negative for cough and shortness of breath.   Cardiovascular: Negative for chest pain and palpitations.  Gastrointestinal: Negative for nausea, vomiting, abdominal pain and bowel incontinence.  Musculoskeletal: Negative for back pain and joint swelling.  Skin: Negative for rash and wound.  Neurological: Positive for headaches. Negative for tingling, seizures, loss of consciousness, syncope, weakness, light-headedness and numbness.       Positive transient dizziness after fall. Positive drowsiness  Hematological: Does not bruise/bleed easily.  Psychiatric/Behavioral: Negative for behavioral problems, confusion and agitation.    Allergies  Phenergan-codeine  Home Medications   Current Outpatient Rx  Name Route Sig Dispense Refill  . ALBUTEROL SULFATE HFA 108 (90 BASE) MCG/ACT IN AERS Inhalation Inhale 2 puffs into the lungs every 6 (six) hours as needed. Shortness of breath    . BECLOMETHASONE DIPROPIONATE 80 MCG/ACT IN AERS Inhalation Inhale 2 puffs into the lungs 2 (two) times daily.      Marland Kitchen FLUTICASONE PROPIONATE 50 MCG/ACT NA SUSP       . NAPROXEN  500 MG PO TABS Oral Take 500 mg by mouth daily as needed. pain     . NON FORMULARY  Patient receives allergy shots 2 times per week     . NON FORMULARY  Patient received allergy shots once per week.     Marland Kitchen OMEPRAZOLE 20 MG PO CPDR Oral Take 20 mg by mouth every morning.      Marland Kitchen OXYMETAZOLINE HCL 0.05 % NA SOLN Nasal Place 1 spray into the nose 2 (two) times daily as needed. As needed for nasal problems    . POLYETHYL GLYCOL-PROPYL GLYCOL 0.4-0.3 % OP SOLN Ophthalmic Apply to eye.      Marland Kitchen RANITIDINE HCL 150 MG PO TABS Oral Take 150 mg by mouth at bedtime.         BP 129/79  Pulse 80  Temp(Src) 97.7 F (36.5 C) (Oral)  Resp 18  SpO2 100%  Physical Exam  Constitutional: She is oriented to person, place, and time. She appears well-developed and well-nourished. No distress.  HENT:  Head: Normocephalic.  Right Ear: External ear normal.  Left Ear: External ear normal.       TTP right occiput  Eyes: Conjunctivae and EOM are normal. Pupils are equal, round, and reactive to light. No scleral icterus.  Neck: Normal range of motion. Neck supple.  Cardiovascular: Normal rate, regular rhythm and normal heart sounds.   Pulmonary/Chest: Effort normal and breath sounds normal. She exhibits no tenderness.  Abdominal: Soft. Bowel sounds are normal. She exhibits no distension. There is no tenderness.  Musculoskeletal: Normal range of motion. She exhibits no edema and no tenderness.  Neurological: She is alert and oriented to person, place, and time. She has normal strength. She is not disoriented. No cranial nerve deficit or sensory deficit. She displays a negative Romberg sign. Coordination and gait normal. GCS eye subscore is 4. GCS verbal subscore is 5. GCS motor subscore is 6.  Skin: Skin is warm and dry. No rash noted. She is not diaphoretic.  Psychiatric: She has a normal mood and affect. Her behavior is normal. Thought content normal.    ED Course  Procedures (including critical care time)  Ct Head Wo Contrast  10/09/2011  *RADIOLOGY REPORT*  Clinical Data: 60 year old female status post fall with pain, dizziness, drowsiness.  CT HEAD WITHOUT CONTRAST  Technique:  Contiguous axial images were obtained from the base of the skull through the vertex without contrast.  Comparison: Paranasal sinus CTs 01/13/2011 and earlier.  Findings: Visualized orbit soft tissues are within normal limits. No scalp hematoma identified.  Calvarium intact.  Mastoids and tympanic cavities are clear.  Chronic widespread hyperdense opacification of the maxillary, frontal,  and sphenoid sinuses sequelae of ethmoidectomies. Mucoperiosteal thickening of the sinuses is widespread.  Cerebral volume is within normal limits for age.  No midline shift, ventriculomegaly, mass effect, evidence of mass lesion, intracranial hemorrhage or evidence of cortically based acute infarction.  Gray-white matter differentiation is within normal limits throughout the brain.  No suspicious intracranial vascular hyperdensity.  IMPRESSION: 1. Normal noncontrast CT appearance of the brain. No acute traumatic injury identified. 2.  Chronic paranasal sinus disease.  Original Report Authenticated By: Harley Hallmark, M.D.   1. Head injury       MDM  60 y/o with fall 2h PTA striking occiput. Neuro intact, CT head with no evidence of hemorrhage. Will d/c home.        Elwyn Reach [place], [place] 10/09/11 1442

## 2011-10-09 NOTE — Telephone Encounter (Signed)
Patient called from [facility] ED after having a fall [hit head but the CT scan was Ok] she is concerned that her BP was 141/100 at hospital and was wondering if she needed to resume HTN medication.? LMOM that she should call our office tomorrow to schedule F/U post ED appointment & to keep a record of BP readings at least twice a day; if BP were to continue to remain at this level after leaving hospital or symptoms such as: lightheadedness, dizzy, faintness, blurred vision, nausea with or without vomiting, pain in chest and/or left arm were to begin to seek medical assessment at ED or UC.

## 2011-10-12 ENCOUNTER — Ambulatory Visit (INDEPENDENT_AMBULATORY_CARE_PROVIDER_SITE_OTHER): Payer: Self-pay | Admitting: Internal Medicine

## 2011-10-12 ENCOUNTER — Ambulatory Visit (INDEPENDENT_AMBULATORY_CARE_PROVIDER_SITE_OTHER): Payer: Self-pay

## 2011-10-12 ENCOUNTER — Encounter: Payer: Self-pay | Admitting: Internal Medicine

## 2011-10-12 VITALS — BP 124/80 | HR 76 | Temp 98.1°F | Ht 61.5 in | Wt 215.5 lb

## 2011-10-12 DIAGNOSIS — I10 Essential (primary) hypertension: Secondary | ICD-10-CM

## 2011-10-12 DIAGNOSIS — F329 Major depressive disorder, single episode, unspecified: Secondary | ICD-10-CM

## 2011-10-12 DIAGNOSIS — J309 Allergic rhinitis, unspecified: Secondary | ICD-10-CM

## 2011-10-12 DIAGNOSIS — M7022 Olecranon bursitis, left elbow: Secondary | ICD-10-CM | POA: Insufficient documentation

## 2011-10-12 DIAGNOSIS — M7020 Olecranon bursitis, unspecified elbow: Secondary | ICD-10-CM

## 2011-10-12 MED ORDER — DICLOFENAC SODIUM 1.5 % TD SOLN: 10.0000 [drp] | Freq: Four times a day (QID) | TRANSDERMAL | Status: DC | PRN

## 2011-10-12 MED ORDER — NAPROXEN 500 MG PO TABS: 500.0000 mg | ORAL_TABLET | Freq: Two times a day (BID) | ORAL | Status: DC

## 2011-10-12 NOTE — Patient Instructions (Addendum)
Take all new medications as prescribed Continue all other medications as before  

## 2011-10-14 ENCOUNTER — Encounter: Payer: Self-pay | Admitting: Internal Medicine

## 2011-10-14 NOTE — Assessment & Plan Note (Signed)
stable overall by hx and exam, most recent data reviewed with pt, and pt to continue medical treatment as before  Lab Results  Component Value Date   WBC 6.2 07/27/2010   HGB 11.9* 07/27/2010   HCT 35.9* 07/27/2010   PLT 205.0 07/27/2010   GLUCOSE 105* 07/27/2010   CHOL 104 07/27/2010   TRIG 102.0 07/27/2010   HDL 47.40 07/27/2010   LDLCALC 36 07/27/2010   ALT 19 07/27/2010   AST 19 07/27/2010   NA 143 07/27/2010   K 3.2* 07/27/2010   CL 99 07/27/2010   CREATININE 0.7 07/27/2010   BUN 8 07/27/2010   CO2 35* 07/27/2010   TSH 1.63 07/27/2010   HGBA1C 5.8 07/07/2009

## 2011-10-14 NOTE — Assessment & Plan Note (Signed)
Mild to mod, for pennsaid course,  to f/u any worsening symptoms or concerns

## 2011-10-14 NOTE — Progress Notes (Signed)
Subjective:    Patient ID: Andrea Mason, female    DOB: 1951-09-27, 60 y.o.   MRN: [id number]  HPI  Here after seen in ER recently, fell backwards after son lifted her leg to look at her foot, pt lost her balance and fell strkiing head on hard floor, seen in ER with neg Head CT, no concussion symtpoms and HA resolved.  BP elev at that time as well but improved today.  Pt denies chest pain, increased sob or doe, wheezing, orthopnea, PND, increased LE swelling, palpitations, dizziness or syncope.  Pt denies new neurological symptoms such as new headache, or facial or extremity weakness or numbness   Pt denies polydipsia, polyuria. Does however have new swelling to tip of left elbow with mild pain today, not sure if hit on the floor as well.   Pt denies fever, wt loss, night sweats, loss of appetite, or other constitutional symptoms  Overall good compliance with treatment, and good medicine tolerability. Denies worsening depressive symptoms, suicidal ideation, or panic, though has ongoing anxiety, not increased recently.   Past Medical History  Diagnosis Date  . Hypertension   . GERD (gastroesophageal reflux disease)   . Allergic rhinitis   . Asthma   . Obesity   . Depression   . History of colonic polyps   . DJD (degenerative joint disease), cervical    Past Surgical History  Procedure Date  . Nasal sinus surgery 07/2006    Dr. Annalee Genta  . Nasal polyp surgery 07/2006    x 2 with Dr. Annalee Genta  . Foot surgery 1998  . Tubal ligation     reports that she has never smoked. She does not have any smokeless tobacco history on file. She reports that she does not drink alcohol or use illicit drugs. family history is not on file. Allergies  Allergen Reactions  . Phenergan-Codeine Nausea And Vomiting   Current Outpatient Prescriptions on File Prior to Visit  Medication Sig Dispense Refill  . albuterol (PROAIR HFA) 108 (90 BASE) MCG/ACT inhaler Inhale 2 puffs into the lungs every 6 (six) hours as  needed. Shortness of breath      . beclomethasone (QVAR) 80 MCG/ACT inhaler Inhale 2 puffs into the lungs 2 (two) times daily.        . cetirizine (ZYRTEC) 10 MG tablet Take 10 mg by mouth at bedtime.        . fluticasone (FLONASE) 50 MCG/ACT nasal spray        . Menthol, Topical Analgesic, 1 % AERP Apply 1 application topically as needed. Patient uses 3 to 4 times weekly       . NON FORMULARY Patient received allergy shots once per week.       Marland Kitchen omeprazole (PRILOSEC) 20 MG capsule Take 20 mg by mouth every morning.        Marland Kitchen OVER THE COUNTER MEDICATION Apply 1 drop to eye 2 (two) times daily. Over the counter eye drop for tear ducts       . oxymetazoline (AFRIN) 0.05 % nasal spray Place 1 spray into the nose 2 (two) times daily as needed. As needed for nasal problems      . Polyethyl Glycol-Propyl Glycol (SYSTANE) 0.4-0.3 % SOLN Apply 1 drop to eye 4 (four) times daily.       . ranitidine (ZANTAC) 150 MG tablet Take 150 mg by mouth at bedtime.        . zafirlukast (ACCOLATE) 10 MG tablet Take 10 mg by mouth 2 (  two) times daily.        . NON FORMULARY Patient receives allergy shots 2 times per week        Review of Systems Review of Systems  Constitutional: Negative for diaphoresis and unexpected weight change.  HENT: Negative for drooling and tinnitus.   Eyes: Negative for photophobia and visual disturbance.  Respiratory: Negative for choking and stridor.   Gastrointestinal: Negative for vomiting and blood in stool.  Genitourinary: Negative for hematuria and decreased urine volume.  Musculoskeletal: Negative for gait problem.  Skin: Negative for color change and wound.  Neurological: Negative for tremors and numbness.  Psychiatric/Behavioral: Negative for decreased concentration. The patient is not hyperactive.       Objective:   Physical Exam BP 124/80  Pulse 76  Temp(Src) 98.1 F (36.7 C) (Oral)  Ht 5' 1.5" (1.562 m)  Wt 215 lb 8 oz (97.75 kg)  BMI 40.06 kg/m2  SpO2  97% N/APhysical Exam  VS noted Constitutional: Pt appears well-developed and well-nourished.  HENT: Head: Normocephalic.  Right Ear: External ear normal.  Left Ear: External ear normal.  Eyes: Conjunctivae and EOM are normal. Pupils are equal, round, and reactive to light.  Neck: Normal range of motion. Neck supple.  Cardiovascular: Normal rate and regular rhythm.   Pulmonary/Chest: Effort normal and breath sounds normal.  Abd:  Soft, NT, non-distended, + BS Neurological: Pt is alert. No cranial nerve deficit.  Skin: Skin is warm. No erythema.  Psychiatric: Pt behavior is normal. Thought content normal. not depressed affect  Left elbow with 1+ effusion/tender left elbow bursa    Assessment & Plan:

## 2011-10-14 NOTE — Assessment & Plan Note (Signed)
stable overall by hx and exam, most recent data reviewed with pt, and pt to continue medical treatment as before  BP Readings from Last 3 Encounters:  10/12/11 124/80  10/09/11 141/100  08/09/11 136/90

## 2011-10-31 ENCOUNTER — Telehealth: Payer: Self-pay | Admitting: Internal Medicine

## 2011-10-31 ENCOUNTER — Ambulatory Visit (INDEPENDENT_AMBULATORY_CARE_PROVIDER_SITE_OTHER): Payer: Self-pay

## 2011-10-31 DIAGNOSIS — J309 Allergic rhinitis, unspecified: Secondary | ICD-10-CM

## 2011-10-31 NOTE — Telephone Encounter (Signed)
[id number] 

## 2011-11-01 ENCOUNTER — Ambulatory Visit (INDEPENDENT_AMBULATORY_CARE_PROVIDER_SITE_OTHER): Payer: Self-pay | Admitting: Internal Medicine

## 2011-11-01 ENCOUNTER — Encounter: Payer: Self-pay | Admitting: Internal Medicine

## 2011-11-01 VITALS — BP 130/94 | HR 74 | Temp 97.7°F | Wt 210.0 lb

## 2011-11-01 DIAGNOSIS — J309 Allergic rhinitis, unspecified: Secondary | ICD-10-CM

## 2011-11-01 MED ORDER — METHYLPREDNISOLONE ACETATE 80 MG/ML IJ SUSP
120.0000 mg | Freq: Once | INTRAMUSCULAR | Status: AC
  Administered 2011-11-01: 120 mg via INTRAMUSCULAR

## 2011-11-01 NOTE — Patient Instructions (Signed)
It was good to see you today. Steroid shot given today for your allergy symptom

## 2011-11-01 NOTE — Progress Notes (Signed)
  Subjective:    Patient ID: Andrea Mason, female    DOB: [birth year], 60 y.o.   MRN: [id number]  HPI  Complains of allergy symptoms symptoms consist of watery eyes, sneezing and sinus pressure  Past medical history reviewed  Review of Systems  Constitutional: Negative for fever and fatigue.  Respiratory: Negative for cough and shortness of breath.   Cardiovascular: Negative for palpitations.       Objective:   Physical Exam BP 130/94  Pulse 74  Temp(Src) 97.7 F (36.5 C) (Oral)  Wt 210 lb (95.255 kg)  SpO2 96% Constitutional: She appears well-developed and well-nourished. No distress.  HENT: Head: Normocephalic and atraumatic. Ears: B TMs ok, no erythema or effusion; Nose: Nose normal.  Mouth/Throat: Oropharynx is clear and moist. No oropharyngeal exudate.  Eyes: Watery clear discharge , mild conjunctitis. No scleral icterus.  Neck: Normal range of motion. Neck supple. No JVD present. No thyromegaly present.  Cardiovascular: Normal rate, regular rhythm and normal heart sounds.  No murmur heard. No BLE edema. Pulmonary/Chest: Effort normal and breath sounds normal. No respiratory distress. She has no wheezes. Psychiatric: She has a normal mood and affect. Her behavior is normal. Judgment and thought content normal.   Lab Results  Component Value Date   WBC 6.2 07/27/2010   HGB 11.9* 07/27/2010   HCT 35.9* 07/27/2010   PLT 205.0 07/27/2010   GLUCOSE 105* 07/27/2010   CHOL 104 07/27/2010   TRIG 102.0 07/27/2010   HDL 47.40 07/27/2010   LDLCALC 36 07/27/2010   ALT 19 07/27/2010   AST 19 07/27/2010   NA 143 07/27/2010   K 3.2* 07/27/2010   CL 99 07/27/2010   CREATININE 0.7 07/27/2010   BUN 8 07/27/2010   CO2 35* 07/27/2010   TSH 1.63 07/27/2010   HGBA1C 5.8 07/07/2009        Assessment & Plan:  See problem list. Medications and labs reviewed today.  Allergic rhinitis, allergy panel July 2011 reviewed -ongoing allergy shots with pulmonary/allergy up stairs Periodic steroid shot  necessary do to increase symptoms -repeat IM Medrol today

## 2011-11-01 NOTE — Telephone Encounter (Signed)
PT RETURNED CALL . Kathleen W Perdue  °

## 2011-11-01 NOTE — Telephone Encounter (Signed)
Spoke with the patient but her phone dropped the call.  Called back and spoke to the pt. She c/o congestion and cough and wants to be seen. She will see TP on Thurs., 11/29 @ 12pm.  She is also wanting to go back on the Nexium and says she will discuss this at her OV with TP.

## 2011-11-02 ENCOUNTER — Ambulatory Visit: Payer: Self-pay | Admitting: Adult Health

## 2011-11-09 ENCOUNTER — Ambulatory Visit (INDEPENDENT_AMBULATORY_CARE_PROVIDER_SITE_OTHER): Payer: Self-pay

## 2011-11-09 DIAGNOSIS — J309 Allergic rhinitis, unspecified: Secondary | ICD-10-CM

## 2011-11-17 ENCOUNTER — Telehealth: Payer: Self-pay | Admitting: Internal Medicine

## 2011-11-17 ENCOUNTER — Ambulatory Visit (INDEPENDENT_AMBULATORY_CARE_PROVIDER_SITE_OTHER): Payer: Self-pay

## 2011-11-17 DIAGNOSIS — J309 Allergic rhinitis, unspecified: Secondary | ICD-10-CM

## 2011-11-17 MED ORDER — TRAMADOL HCL 50 MG PO TABS: 50.0000 mg | ORAL_TABLET | Freq: Three times a day (TID) | ORAL | Status: DC | PRN

## 2011-11-17 NOTE — Telephone Encounter (Signed)
Called and spoke with pt and she is aware of CY recs for the tramadol. And to use the heating pad.

## 2011-11-17 NOTE — Telephone Encounter (Signed)
Per CY-take Tramadol 50mg  #20 take  Every 8 hours prn pain or cough no refills; heating pad.

## 2011-11-17 NOTE — Telephone Encounter (Signed)
Called and spoke with pt and she stated that she has been sick x 2 days with coughing, sneezing.  Upper back pain that started last night--worse with cough, talking at times, even pain when sitting.  Pt was requesting appt but   Explained  To the pt that no appt aval. With CY---will forward message to CY for recs.

## 2011-11-20 ENCOUNTER — Ambulatory Visit (INDEPENDENT_AMBULATORY_CARE_PROVIDER_SITE_OTHER)
Admission: RE | Admit: 2011-11-20 | Discharge: 2011-11-20 | Disposition: A | Discharge status: Home / Self Care | Payer: Self-pay | Source: Ambulatory Visit | Attending: Internal Medicine | Admitting: Internal Medicine

## 2011-11-20 ENCOUNTER — Telehealth: Payer: Self-pay | Admitting: Internal Medicine

## 2011-11-20 ENCOUNTER — Other Ambulatory Visit (INDEPENDENT_AMBULATORY_CARE_PROVIDER_SITE_OTHER): Payer: Self-pay

## 2011-11-20 DIAGNOSIS — J40 Bronchitis, not specified as acute or chronic: Secondary | ICD-10-CM

## 2011-11-20 LAB — CBC WITH DIFFERENTIAL/PLATELET
Basophils Relative: 0.5 % (ref 0.0–3.0)
Eosinophils Relative: 2.8 % (ref 0.0–5.0)
HCT: 38.6 % (ref 36.0–46.0)
Lymphs Abs: 1.1 10*3/uL (ref 0.7–4.0)
MCV: 90 fl (ref 78.0–100.0)
Monocytes Absolute: 0.6 10*3/uL (ref 0.1–1.0)
RBC: 4.29 Mil/uL (ref 3.87–5.11)
WBC: 3.7 10*3/uL — ABNORMAL LOW (ref 4.5–10.5)

## 2011-11-20 MED ORDER — AZITHROMYCIN 250 MG PO TABS: ORAL_TABLET | ORAL | Status: AC

## 2011-11-20 NOTE — Telephone Encounter (Signed)
Per CY-okay to give Zpak #1 take as directed no refills; order outpatient CXR and CBC-diff dx bronchitis. Pt is aware and will come by today before 530 pm to have CXR and lab done

## 2011-11-20 NOTE — Telephone Encounter (Signed)
Patient is calling back  °

## 2011-11-20 NOTE — Telephone Encounter (Signed)
[id number] x1 

## 2011-11-20 NOTE — Telephone Encounter (Signed)
I spoke with pt and she c/o cough w/ green phlem, chest congestion, runny nose, PND, nausea but no vomiting, wheezing (worse at night), pain on the right side in her ribs. Pt states the tramadol has helped with her cough and she also started taking generic mucinex BID. Pt denies any fever or vomiting Please advise Dr. Maple Hudson, thanks  Allergies  Allergen Reactions  . Phenergan-Codeine Nausea And Vomiting

## 2011-11-27 ENCOUNTER — Ambulatory Visit: Payer: Self-pay | Admitting: Internal Medicine

## 2011-12-12 ENCOUNTER — Ambulatory Visit (INDEPENDENT_AMBULATORY_CARE_PROVIDER_SITE_OTHER): Payer: Self-pay

## 2011-12-12 DIAGNOSIS — J309 Allergic rhinitis, unspecified: Secondary | ICD-10-CM

## 2011-12-21 ENCOUNTER — Ambulatory Visit (INDEPENDENT_AMBULATORY_CARE_PROVIDER_SITE_OTHER): Payer: Self-pay

## 2011-12-21 DIAGNOSIS — J309 Allergic rhinitis, unspecified: Secondary | ICD-10-CM

## 2011-12-29 ENCOUNTER — Ambulatory Visit (INDEPENDENT_AMBULATORY_CARE_PROVIDER_SITE_OTHER): Payer: Self-pay

## 2011-12-29 DIAGNOSIS — J309 Allergic rhinitis, unspecified: Secondary | ICD-10-CM

## 2012-01-02 ENCOUNTER — Ambulatory Visit (INDEPENDENT_AMBULATORY_CARE_PROVIDER_SITE_OTHER): Payer: Self-pay | Admitting: Internal Medicine

## 2012-01-02 ENCOUNTER — Encounter: Payer: Self-pay | Admitting: Internal Medicine

## 2012-01-02 VITALS — BP 122/84 | HR 88 | Temp 98.2°F | Ht 61.5 in | Wt 202.0 lb

## 2012-01-02 DIAGNOSIS — J45901 Unspecified asthma with (acute) exacerbation: Secondary | ICD-10-CM | POA: Insufficient documentation

## 2012-01-02 DIAGNOSIS — J209 Acute bronchitis, unspecified: Secondary | ICD-10-CM | POA: Insufficient documentation

## 2012-01-02 DIAGNOSIS — J309 Allergic rhinitis, unspecified: Secondary | ICD-10-CM

## 2012-01-02 DIAGNOSIS — K5900 Constipation, unspecified: Secondary | ICD-10-CM | POA: Insufficient documentation

## 2012-01-02 MED ORDER — ESOMEPRAZOLE MAGNESIUM 40 MG PO CPDR: 40.0000 mg | DELAYED_RELEASE_CAPSULE | Freq: Every day | ORAL | Status: DC

## 2012-01-02 MED ORDER — CEPHALEXIN 500 MG PO CAPS: 500.0000 mg | ORAL_CAPSULE | Freq: Four times a day (QID) | ORAL | Status: AC

## 2012-01-02 MED ORDER — PREDNISONE 10 MG PO TABS: ORAL_TABLET | ORAL | Status: DC

## 2012-01-02 NOTE — Patient Instructions (Signed)
Take all new medications as prescribed - the antibiotic, and prednisone (sent to your pharmacy) Continue all other medications as before You can also use OTC benadryl for allergy and to help with sleep at night You are given the prescription for nexium to use with the free program with [employer] Please keep your appointments with your specialists as you have planned  - Dr Sherene Sires

## 2012-01-02 NOTE — Assessment & Plan Note (Signed)
For miralax otc prn, and stool softner prn,  to f/u any worsening symptoms or concerns

## 2012-01-02 NOTE — Assessment & Plan Note (Signed)
Mild to mod, for predpack  course,  to f/u any worsening symptoms or concerns 

## 2012-01-02 NOTE — Progress Notes (Signed)
Subjective:    Patient ID: Andrea Mason, female    DOB: 07/25/1951, 61 y.o.   MRN: [id number]  HPI  Here with acute onset mild to mod 2-3 days ST, HA, general weakness and malaise, with prod cough greenish sputum, but Pt denies chest pain, increased sob or doe, wheezing, orthopnea, PND, increased LE swelling, palpitations, dizziness or syncope, except for ? Mild wheeze onset this am.  Does have several wks ongoing nasal allergy symptoms with clear congestion, itch and sneeze, without fever, pain, ST, cough or wheezing.  Also with recent mild constipation with less activity recent.  Denies worsening reflux, dysphagia, abd pain, n/v, bowel change or blood, as long as she takes nexium, which she can get free with her drug assist program Past Medical History  Diagnosis Date  . Hypertension   . GERD (gastroesophageal reflux disease)   . Allergic rhinitis   . Asthma   . Obesity   . Depression   . History of colonic polyps   . DJD (degenerative joint disease), cervical    Past Surgical History  Procedure Date  . Nasal sinus surgery 07/2006    Dr. Annalee Genta  . Nasal polyp surgery 07/2006    x 2 with Dr. Annalee Genta  . Foot surgery 1998  . Tubal ligation     reports that she has never smoked. She does not have any smokeless tobacco history on file. She reports that she does not drink alcohol or use illicit drugs. family history is not on file. Allergies  Allergen Reactions  . Phenergan-Codeine Nausea And Vomiting   Current Outpatient Prescriptions on File Prior to Visit  Medication Sig Dispense Refill  . albuterol (PROAIR HFA) 108 (90 BASE) MCG/ACT inhaler Inhale 2 puffs into the lungs every 6 (six) hours as needed. Shortness of breath      . beclomethasone (QVAR) 80 MCG/ACT inhaler Inhale 2 puffs into the lungs 2 (two) times daily.        . fluticasone (FLONASE) 50 MCG/ACT nasal spray        . NON FORMULARY Patient received allergy shots once per week.       Marland Kitchen OVER THE COUNTER MEDICATION  Apply 1 drop to eye 2 (two) times daily. Over the counter eye drop for tear ducts       . oxymetazoline (AFRIN) 0.05 % nasal spray Place 1 spray into the nose 2 (two) times daily as needed. As needed for nasal problems      . traMADol (ULTRAM) 50 MG tablet Take 50 mg by mouth every 8 (eight) hours as needed. Maximum dose= 8 tablets per day       . zafirlukast (ACCOLATE) 10 MG tablet Take 10 mg by mouth 2 (two) times daily.        . Menthol, Topical Analgesic, 1 % AERP Apply 1 application topically as needed. Patient uses 3 to 4 times weekly       . omeprazole (PRILOSEC) 20 MG capsule Take 20 mg by mouth every morning.        Marland Kitchen DISCONTD: beclomethasone (QVAR) 80 MCG/ACT inhaler Inhale 1 puff into the lungs as needed.  1 Inhaler  12   Review of Systems Review of Systems  Constitutional: Negative for diaphoresis and unexpected weight change.  HENT: Negative for drooling and tinnitus.   Eyes: Negative for photophobia and visual disturbance.  Respiratory: Negative for choking and stridor.   Gastrointestinal: Negative for vomiting and blood in stool.  Genitourinary: Negative for hematuria and decreased urine  volume.    Objective:   Physical Exam BP 122/84  Pulse 88  Temp(Src) 98.2 F (36.8 C) (Oral)  Ht 5' 1.5" (1.562 m)  Wt 202 lb (91.627 kg)  BMI 37.55 kg/m2  SpO2 96% Physical Exam  VS noted, mild ill Constitutional: Pt appears well-developed and well-nourished.  HENT: Head: Normocephalic.  Right Ear: External ear normal.  Left Ear: External ear normal.  Bilat tm's mild erythema.  Sinus nontender.  Pharynx mild erythema Eyes: Conjunctivae and EOM are normal. Pupils are equal, round, and reactive to light.  Neck: Normal range of motion. Neck supple.  Cardiovascular: Normal rate and regular rhythm.   Pulmonary/Chest: Effort normal and breath sounds mild decreased with bilat wheeze.  Abd:  Soft, NT,ND, +BS Neurological: Pt is alert. No cranial nerve deficit.  Skin: Skin is warm. No  erythema.  Psychiatric: Pt behavior is normal. Thought content normal.     Assessment & Plan:

## 2012-01-02 NOTE — Assessment & Plan Note (Signed)
Ok also for benadryl otc, may help with sleep as well, to f/u any worsening symptoms or concerns

## 2012-01-02 NOTE — Assessment & Plan Note (Signed)
Mild to mod, for antibx course,  to f/u any worsening symptoms or concerns 

## 2012-01-22 ENCOUNTER — Encounter: Payer: Self-pay | Admitting: Internal Medicine

## 2012-01-22 ENCOUNTER — Telehealth: Payer: Self-pay

## 2012-01-22 ENCOUNTER — Ambulatory Visit (INDEPENDENT_AMBULATORY_CARE_PROVIDER_SITE_OTHER): Payer: Self-pay | Admitting: Internal Medicine

## 2012-01-22 VITALS — BP 128/82 | HR 90 | Temp 97.8°F | Wt 203.8 lb

## 2012-01-22 DIAGNOSIS — J209 Acute bronchitis, unspecified: Secondary | ICD-10-CM

## 2012-01-22 DIAGNOSIS — J45901 Unspecified asthma with (acute) exacerbation: Secondary | ICD-10-CM

## 2012-01-22 DIAGNOSIS — K1370 Unspecified lesions of oral mucosa: Secondary | ICD-10-CM

## 2012-01-22 DIAGNOSIS — I10 Essential (primary) hypertension: Secondary | ICD-10-CM

## 2012-01-22 MED ORDER — PREDNISONE 10 MG PO TABS: 10.0000 mg | ORAL_TABLET | Freq: Every day | ORAL | Status: DC

## 2012-01-22 MED ORDER — LEVOFLOXACIN 250 MG PO TABS: 250.0000 mg | ORAL_TABLET | Freq: Every day | ORAL | Status: DC

## 2012-01-22 MED ORDER — DIPHENHYD-HYDROCORT-NYSTATIN MT SUSP: 5.0000 mL | Freq: Three times a day (TID) | OROMUCOSAL | Status: DC | PRN

## 2012-01-22 MED ORDER — HYDROCODONE-HOMATROPINE 5-1.5 MG/5ML PO SYRP: 5.0000 mL | ORAL_SOLUTION | Freq: Four times a day (QID) | ORAL | Status: AC | PRN

## 2012-01-22 MED ORDER — CEPHALEXIN 500 MG PO CAPS: 500.0000 mg | ORAL_CAPSULE | Freq: Four times a day (QID) | ORAL | Status: AC

## 2012-01-22 NOTE — Assessment & Plan Note (Signed)
Mild to mod, for antibx course,  to f/u any worsening symptoms or concerns 

## 2012-01-22 NOTE — Patient Instructions (Signed)
Take all new medications as prescribed Continue all other medications as before Please call in 1 week if the mouthwash does not help, for Oral Surgeon referral

## 2012-01-22 NOTE — Progress Notes (Signed)
Subjective:    Patient ID: Andrea Mason, female    DOB: 13-May-1951, 61 y.o.   MRN: [id number]  HPI  Here with acute onset mild to mod 2-3 days ST, HA, general weakness and malaise, with prod cough greenish sputum, but Pt denies chest pain, increased sob or doe, wheezing, orthopnea, PND, increased LE swelling, palpitations, dizziness or syncope, except for onset midl wheeze today.  Also mentions a lesion to the roof of the mouth, nonpainful, ? Mild enlarged since 6 mo Past Medical History  Diagnosis Date  . Hypertension   . GERD (gastroesophageal reflux disease)   . Allergic rhinitis   . Asthma   . Obesity   . Depression   . History of colonic polyps   . DJD (degenerative joint disease), cervical    Past Surgical History  Procedure Date  . Nasal sinus surgery 07/2006    Dr. Annalee Genta  . Nasal polyp surgery 07/2006    x 2 with Dr. Annalee Genta  . Foot surgery 1998  . Tubal ligation     reports that she has never smoked. She does not have any smokeless tobacco history on file. She reports that she does not drink alcohol or use illicit drugs. family history is not on file. Allergies  Allergen Reactions  . Phenergan-Codeine Nausea And Vomiting   Current Outpatient Prescriptions on File Prior to Visit  Medication Sig Dispense Refill  . albuterol (PROAIR HFA) 108 (90 BASE) MCG/ACT inhaler Inhale 2 puffs into the lungs every 6 (six) hours as needed. Shortness of breath      . beclomethasone (QVAR) 80 MCG/ACT inhaler Inhale 2 puffs into the lungs 2 (two) times daily.        . fluticasone (FLONASE) 50 MCG/ACT nasal spray        . Menthol, Topical Analgesic, 1 % AERP Apply 1 application topically as needed. Patient uses 3 to 4 times weekly       . NON FORMULARY Patient received allergy shots once per week.       Marland Kitchen omeprazole (PRILOSEC) 20 MG capsule Take 20 mg by mouth every morning.        Marland Kitchen OVER THE COUNTER MEDICATION Apply 1 drop to eye 2 (two) times daily. Over the counter eye drop for  tear ducts       . oxymetazoline (AFRIN) 0.05 % nasal spray Place 1 spray into the nose 2 (two) times daily as needed. As needed for nasal problems      . traMADol (ULTRAM) 50 MG tablet Take 50 mg by mouth every 8 (eight) hours as needed. Maximum dose= 8 tablets per day       . zafirlukast (ACCOLATE) 10 MG tablet Take 10 mg by mouth 2 (two) times daily.        Marland Kitchen esomeprazole (NEXIUM) 40 MG capsule Take 1 capsule (40 mg total) by mouth daily.  90 capsule  3  . DISCONTD: beclomethasone (QVAR) 80 MCG/ACT inhaler Inhale 1 puff into the lungs as needed.  1 Inhaler  12   Review of Systems Review of Systems  Constitutional: Negative for diaphoresis and unexpected weight change.  HENT: Negative for drooling and tinnitus.   Eyes: Negative for photophobia and visual disturbance.  Respiratory: Negative for choking and stridor.   Gastrointestinal: Negative for vomiting and blood in stool.  Genitourinary: Negative for hematuria and decreased urine volume.        Objective:   Physical Exam BP 128/82  Pulse 90  Temp(Src) 97.8 F (36.6  C) (Oral)  Wt 203 lb 12.8 oz (92.443 kg)  SpO2 95% Physical Exam  VS noted, mild ill Constitutional: Pt appears well-developed and well-nourished.  HENT: Head: Normocephalic.  Right Ear: External ear normal.  Left Ear: External ear normal.  Bilat tm's mild erythema.  Sinus nontender.  Pharynx mild erythema Mouth with flat lesion to roof behind front incisors approx 10 mm, tan/brown Eyes: Conjunctivae and EOM are normal. Pupils are equal, round, and reactive to light.  Neck: Normal range of motion. Neck supple.  Cardiovascular: Normal rate and regular rhythm.   Pulmonary/Chest: Effort normal and breath sounds mild decr , mild wheezing Neurological: Pt is alert. No cranial nerve deficit.  Skin: Skin is warm. No erythema.  Psychiatric: Pt behavior is normal. Thought content normal.      Assessment & Plan:

## 2012-01-22 NOTE — Assessment & Plan Note (Signed)
stable overall by hx and exam, most recent data reviewed with pt, and pt to continue medical treatment as before BP Readings from Last 3 Encounters:  01/22/12 128/82  01/02/12 122/84  11/01/11 130/94

## 2012-01-22 NOTE — Assessment & Plan Note (Addendum)
For magic mouthwash asd, consider oral surgury if not improved in 1 wk, pt agrees to call

## 2012-01-22 NOTE — Telephone Encounter (Signed)
[facility] Triage Call Report Triage Record Num: [id number] Operator: Merlinda Frederick Patient Name: Andrea Mason Call Date & Time: 01/20/2012 2:00:53PM Patient Phone: [phone] PCP: Oliver Barre Patient Gender: Female PCP Fax : [phone] Patient DOB: 1951/04/15 Practice Name: [facility] Reason for Call: Pt calling 01/20/12 about headache, neck soreness, cough. Hx of Asthma. Onset 01/19/12. T 97.0 orally on thermometer, pt thinks thermometer is broken, pt feels warm/is having chills. Pt took 1000mg  Tylenol at 1400, immediately vomited. Per Asthma Adult Guideline, 911 disp for "new onset confusion with asthma symptoms". Pt will call 911 for immediate medical attention, also advised to use Albuterol Inhaler while waiting for EMS, verbalized understanding. Protocol(s) Used: Asthma - Adult Recommended Outcome per Protocol: Activate EMS 911 Reason for Outcome: New onset of confusion associated with asthma symptoms (wheezing, chest tightness, continuous cough, producing mucus, or difficulty breathing). Care Advice: ~ 01/20/2012 2:29:43PM Page 1 of 1 [id number]

## 2012-01-22 NOTE — Assessment & Plan Note (Signed)
Mild to mod, for predpack asd,  to f/u any worsening symptoms or concerns 

## 2012-01-25 ENCOUNTER — Ambulatory Visit (INDEPENDENT_AMBULATORY_CARE_PROVIDER_SITE_OTHER): Payer: Self-pay

## 2012-01-25 DIAGNOSIS — J309 Allergic rhinitis, unspecified: Secondary | ICD-10-CM

## 2012-02-12 ENCOUNTER — Ambulatory Visit (INDEPENDENT_AMBULATORY_CARE_PROVIDER_SITE_OTHER): Payer: Self-pay

## 2012-02-12 DIAGNOSIS — J309 Allergic rhinitis, unspecified: Secondary | ICD-10-CM

## 2012-02-20 ENCOUNTER — Ambulatory Visit (INDEPENDENT_AMBULATORY_CARE_PROVIDER_SITE_OTHER): Payer: Self-pay

## 2012-02-20 DIAGNOSIS — J309 Allergic rhinitis, unspecified: Secondary | ICD-10-CM

## 2012-03-06 ENCOUNTER — Ambulatory Visit (INDEPENDENT_AMBULATORY_CARE_PROVIDER_SITE_OTHER): Payer: Self-pay

## 2012-03-06 DIAGNOSIS — J309 Allergic rhinitis, unspecified: Secondary | ICD-10-CM

## 2012-03-13 ENCOUNTER — Ambulatory Visit (INDEPENDENT_AMBULATORY_CARE_PROVIDER_SITE_OTHER): Payer: Self-pay

## 2012-03-13 DIAGNOSIS — J309 Allergic rhinitis, unspecified: Secondary | ICD-10-CM

## 2012-03-18 ENCOUNTER — Encounter: Payer: Self-pay | Admitting: Internal Medicine

## 2012-03-21 ENCOUNTER — Ambulatory Visit (INDEPENDENT_AMBULATORY_CARE_PROVIDER_SITE_OTHER): Payer: Self-pay

## 2012-03-21 DIAGNOSIS — J309 Allergic rhinitis, unspecified: Secondary | ICD-10-CM

## 2012-03-23 ENCOUNTER — Encounter: Payer: Self-pay | Admitting: Family Medicine

## 2012-03-23 ENCOUNTER — Ambulatory Visit (INDEPENDENT_AMBULATORY_CARE_PROVIDER_SITE_OTHER): Payer: Self-pay | Admitting: Family Medicine

## 2012-03-23 VITALS — BP 130/84 | Temp 97.4°F | Wt 200.0 lb

## 2012-03-23 DIAGNOSIS — J329 Chronic sinusitis, unspecified: Secondary | ICD-10-CM

## 2012-03-23 DIAGNOSIS — J40 Bronchitis, not specified as acute or chronic: Secondary | ICD-10-CM

## 2012-03-23 MED ORDER — AMOXICILLIN-POT CLAVULANATE 875-125 MG PO TABS: 1.0000 | ORAL_TABLET | Freq: Two times a day (BID) | ORAL | Status: AC

## 2012-03-23 NOTE — Progress Notes (Signed)
  Subjective:    Patient ID: Andrea Mason, female    DOB: 1951-06-30, 61 y.o.   MRN: [id number]  HPI About 8 days ago started feeling fatigue and achey like had the flu but no fever.  Says felt a little better Tuesday and wed and then Thursday started coughing.  Cough is productive.  Using tramadol for hr cough and some hydrocodone cough med left over from [place].  Started mucinex yesterday.  No fever. + nasal congestion.   Her voice is raspy off and on all weeks.  Noticed some swelling around her eyes.  Hx of allergies.  Using her accolate ande her flonase.  Says was choking with her cough.  Feels like she is getting worse.  Using her inhaler several times a day.  Feels it really helps. Still on QVAR as well. Hx of asthma.    Review of Systems     Objective:   Physical Exam  Constitutional: She is oriented to person, place, and time. She appears well-developed and well-nourished.  HENT:  Head: Normocephalic and atraumatic.  Right Ear: External ear normal.  Left Ear: External ear normal.  Nose: Nose normal.  Mouth/Throat: Oropharynx is clear and moist.       TMs and canals are clear. + gum disease.   Eyes: Conjunctivae and EOM are normal. Pupils are equal, round, and reactive to light.  Neck: Neck supple. No thyromegaly present.  Cardiovascular: Normal rate, regular rhythm and normal heart sounds.   Pulmonary/Chest: Effort normal and breath sounds normal. She has no wheezes.  Lymphadenopathy:    She has no cervical adenopathy.  Neurological: She is alert and oriented to person, place, and time.  Skin: Skin is warm and dry.  Psychiatric: She has a normal mood and affect.          Assessment & Plan:  Sinsusitis/bronchits with asthma - Will treat with augmentin.  Lungs are clear, no wheezing.  Call if not better in one week. Continue her mucinex and her cough med ( she  Has almost a whole bottle left).  Continue symptomatic care.    Asthma seems under control today. Use inhaler prn.

## 2012-03-23 NOTE — Patient Instructions (Signed)
Discharge Instructions Browse by Alphabet A B C D E F G H I J K L M N O P Q R S T U V W X Y Z  Browse by Category All Documents Allergy and Immunology Anesthesiology [facility] Cardiology Critical Care Dentistry Dermatology Diabetes Dietary Easy-to-Read Emergency Medicine Endocrinology ENT Family Medicine Forms Gastroenterology Geriatrics Infectious Disease Internal Medicine Labs and Tests Neonatology Nephrology Neurology Obstetrics and Gynecology Oncology Ophthalmology Orthopedics Pediatrics Pharmacology Physical Medicine and Rehabilitation Podiatry Preventative Medicine Procedures Psychiatry Pulmonary Medicine Radiology Rheumatology Surgery Urology Drug Information Sheets All Drug Information Sheets  Browse by Alphabet A B C D E F G H I J K L M N O P Q R S T U V W X Y Z Sinusitis Sinuses are air pockets within the bones of your face. The growth of bacteria within a sinus leads to infection. The infection prevents the sinuses from draining. This infection is called sinusitis. SYMPTOMS   There will be different areas of pain depending on which sinuses have become infected.  The maxillary sinuses often produce pain beneath the eyes.   Frontal sinusitis may cause pain in the middle of the forehead and above the eyes.  Other problems (symptoms) include:  Toothaches.   Colored, pus-like (purulent) drainage from the nose.   Swelling, warmth, and tenderness over the sinus areas may be signs of infection.  TREATMENT   Sinusitis is most often determined by an exam.X-rays may be taken. If x-rays have been taken, make sure you obtain your results or find out how you are to obtain them. Your caregiver may give you medications (antibiotics). These are medications that will help kill the bacteria causing the infection. You may also be given a medication (decongestant) that helps to reduce sinus swelling.   HOME CARE INSTRUCTIONS    Only take  over-the-counter or prescription medicines for pain, discomfort, or fever as directed by your caregiver.   Drink extra fluids. Fluids help thin the mucus so your sinuses can drain more easily.   Applying either moist heat or ice packs to the sinus areas may help relieve discomfort.   Use saline nasal sprays to help moisten your sinuses. The sprays can be found at your local drugstore.  SEEK IMMEDIATE MEDICAL CARE IF:  You have a fever.   You have increasing pain, severe headaches, or toothache.   You have nausea, vomiting, or drowsiness.   You develop unusual swelling around the face or trouble seeing.  MAKE SURE YOU:    Understand these instructions.   Will watch your condition.   Will get help right away if you are not doing well or get worse.  Document Released: 11/20/2005 Document Revised: 11/09/2011 Document Reviewed: 06/19/2007 [facility] Patient Information 2012 [place], [place].

## 2012-03-26 ENCOUNTER — Encounter: Payer: Self-pay | Admitting: Internal Medicine

## 2012-04-05 ENCOUNTER — Ambulatory Visit (INDEPENDENT_AMBULATORY_CARE_PROVIDER_SITE_OTHER): Payer: Self-pay

## 2012-04-05 DIAGNOSIS — J309 Allergic rhinitis, unspecified: Secondary | ICD-10-CM

## 2012-04-10 ENCOUNTER — Ambulatory Visit (INDEPENDENT_AMBULATORY_CARE_PROVIDER_SITE_OTHER): Payer: Self-pay

## 2012-04-10 DIAGNOSIS — J309 Allergic rhinitis, unspecified: Secondary | ICD-10-CM

## 2012-04-17 ENCOUNTER — Ambulatory Visit (INDEPENDENT_AMBULATORY_CARE_PROVIDER_SITE_OTHER): Payer: Self-pay

## 2012-04-17 DIAGNOSIS — J309 Allergic rhinitis, unspecified: Secondary | ICD-10-CM

## 2012-04-25 ENCOUNTER — Ambulatory Visit (INDEPENDENT_AMBULATORY_CARE_PROVIDER_SITE_OTHER): Payer: Self-pay

## 2012-04-25 DIAGNOSIS — J309 Allergic rhinitis, unspecified: Secondary | ICD-10-CM

## 2012-05-01 ENCOUNTER — Encounter: Payer: Self-pay | Admitting: Endocrinology

## 2012-05-01 ENCOUNTER — Ambulatory Visit (INDEPENDENT_AMBULATORY_CARE_PROVIDER_SITE_OTHER)
Admission: RE | Admit: 2012-05-01 | Discharge: 2012-05-01 | Disposition: A | Discharge status: Home / Self Care | Payer: Self-pay | Source: Ambulatory Visit | Attending: Endocrinology | Admitting: Endocrinology

## 2012-05-01 ENCOUNTER — Telehealth: Payer: Self-pay | Admitting: *Deleted

## 2012-05-01 ENCOUNTER — Ambulatory Visit (INDEPENDENT_AMBULATORY_CARE_PROVIDER_SITE_OTHER): Payer: Self-pay | Admitting: Endocrinology

## 2012-05-01 VITALS — BP 138/88 | HR 87 | Temp 98.0°F

## 2012-05-01 DIAGNOSIS — S93402A Sprain of unspecified ligament of left ankle, initial encounter: Secondary | ICD-10-CM | POA: Insufficient documentation

## 2012-05-01 DIAGNOSIS — S93409A Sprain of unspecified ligament of unspecified ankle, initial encounter: Secondary | ICD-10-CM

## 2012-05-01 MED ORDER — TRAMADOL HCL 50 MG PO TABS: 50.0000 mg | ORAL_TABLET | Freq: Three times a day (TID) | ORAL | Status: DC | PRN

## 2012-05-01 NOTE — Progress Notes (Signed)
Subjective:    Patient ID: Andrea Mason, female    DOB: 1951/05/26, 61 y.o.   MRN: [id number]  HPI Pt missteped (in a hole) 4 days ago, twisting left ankle.  Sh has moderate pain since then, and assoc swelling.   Past Medical History  Diagnosis Date  . Hypertension   . GERD (gastroesophageal reflux disease)   . Allergic rhinitis   . Asthma   . Obesity   . Depression   . History of colonic polyps   . DJD (degenerative joint disease), cervical     Past Surgical History  Procedure Date  . Nasal sinus surgery 07/2006    Dr. Annalee Genta  . Nasal polyp surgery 07/2006    x 2 with Dr. Annalee Genta  . Foot surgery 1998  . Tubal ligation     History   Social History  . Marital Status: Widowed    Spouse Name: N/A    Number of Children: 3  . Years of Education: N/A   Occupational History  . cosmetic consultant     [employer]   Social History Main Topics  . Smoking status: Never Smoker   . Smokeless tobacco: Not on file  . Alcohol Use: No  . Drug Use: No  . Sexually Active: Not on file   Other Topics Concern  . Not on file   Social History Narrative   Widowed, husband died in 33Works at [employer] as a Risk manager consultant3 children, 1 died from homicideShe lives with another son and his wife---he has second double lung transplant after complications of MVA.    Current Outpatient Prescriptions on File Prior to Visit  Medication Sig Dispense Refill  . albuterol (PROAIR HFA) 108 (90 BASE) MCG/ACT inhaler Inhale 2 puffs into the lungs every 6 (six) hours as needed. Shortness of breath      . beclomethasone (QVAR) 80 MCG/ACT inhaler Inhale 2 puffs into the lungs 2 (two) times daily.        Marland Kitchen esomeprazole (NEXIUM) 40 MG capsule Take 1 capsule (40 mg total) by mouth daily.  90 capsule  3  . Menthol, Topical Analgesic, 1 % AERP Apply 1 application topically as needed. Patient uses 3 to 4 times weekly       . NON FORMULARY Patient received allergy shots once per week.       Marland Kitchen OVER THE  COUNTER MEDICATION Apply 1 drop to eye 2 (two) times daily. Over the counter eye drop for tear ducts       . oxymetazoline (AFRIN) 0.05 % nasal spray Place 1 spray into the nose 2 (two) times daily as needed. As needed for nasal problems      . zafirlukast (ACCOLATE) 10 MG tablet Take 10 mg by mouth 2 (two) times daily.        . fluticasone (FLONASE) 50 MCG/ACT nasal spray        . omeprazole (PRILOSEC) 20 MG capsule Take 20 mg by mouth every morning.        Marland Kitchen DISCONTD: beclomethasone (QVAR) 80 MCG/ACT inhaler Inhale 1 puff into the lungs as needed.  1 Inhaler  12    Allergies  Allergen Reactions  . Promethazine-Codeine Nausea And Vomiting    No family history on file.  BP 138/88  Pulse 87  Temp(Src) 98 F (36.7 C) (Oral)  SpO2 99%    Review of Systems Denies numbness    Objective:   Physical Exam VITAL SIGNS:  See vs page GENERAL: no distress LLE: at med  and lat malleoli: mod swell/tend.  No warmth or break in the skin.  dorsalis pedis intact.  sensation is intact to touch.    (x-ray: no fx, but oa is seen)    Assessment & Plan:  Ankle sprain, new OA, chronic Borderline HTN, prob related to pain

## 2012-05-01 NOTE — Telephone Encounter (Signed)
Called pt to inform of results ankle xray, pt informed. Pt states that since xray showed that she has arthritis in her ankle should she take Naproxen that was given to her previously for arthritis sxs by Dr. Jonny Ruiz or should she take Tramadol as discussed with MD at [facility].

## 2012-05-01 NOTE — Telephone Encounter (Signed)
Either is ok.

## 2012-05-01 NOTE — Patient Instructions (Addendum)
An x-ray is requested for you today.  You will receive a letter with results. i have sent a prescription to your pharmacy, to refill the tramadol. You will get better much faster if you elevate your foot above the rest of your body. I hope you feel better soon.  If you don't feel better by next week, please call back.  Please call sooner if you get worse.

## 2012-05-02 ENCOUNTER — Ambulatory Visit (INDEPENDENT_AMBULATORY_CARE_PROVIDER_SITE_OTHER): Payer: Self-pay

## 2012-05-02 ENCOUNTER — Ambulatory Visit (INDEPENDENT_AMBULATORY_CARE_PROVIDER_SITE_OTHER): Payer: Self-pay | Admitting: Endocrinology

## 2012-05-02 VITALS — BP 128/88 | HR 89 | Temp 98.1°F

## 2012-05-02 DIAGNOSIS — J309 Allergic rhinitis, unspecified: Secondary | ICD-10-CM

## 2012-05-02 DIAGNOSIS — K5289 Other specified noninfective gastroenteritis and colitis: Secondary | ICD-10-CM

## 2012-05-02 DIAGNOSIS — K529 Noninfective gastroenteritis and colitis, unspecified: Secondary | ICD-10-CM

## 2012-05-02 DIAGNOSIS — R110 Nausea: Secondary | ICD-10-CM

## 2012-05-02 MED ORDER — ONDANSETRON HCL 4 MG PO TABS: 4.0000 mg | ORAL_TABLET | Freq: Three times a day (TID) | ORAL | Status: AC | PRN

## 2012-05-02 MED ORDER — PROMETHAZINE HCL 25 MG/ML IJ SOLN
25.0000 mg | Freq: Once | INTRAMUSCULAR | Status: AC
  Administered 2012-05-02: 25 mg via INTRAMUSCULAR

## 2012-05-02 NOTE — Telephone Encounter (Signed)
Pt informed of MD's advisement. 

## 2012-05-02 NOTE — Progress Notes (Signed)
Subjective:    Patient ID: Andrea Mason, female    DOB: 20-Sep-1951, 61 y.o.   MRN: [id number]  HPI Pt states 1 day of slight left sided cramps in the abdomen, and assoc n/v/d. Past Medical History  Diagnosis Date  . Hypertension   . GERD (gastroesophageal reflux disease)   . Allergic rhinitis   . Asthma   . Obesity   . Depression   . History of colonic polyps   . DJD (degenerative joint disease), cervical     Past Surgical History  Procedure Date  . Nasal sinus surgery 07/2006    Dr. Annalee Genta  . Nasal polyp surgery 07/2006    x 2 with Dr. Annalee Genta  . Foot surgery 1998  . Tubal ligation     History   Social History  . Marital Status: Widowed    Spouse Name: N/A    Number of Children: 3  . Years of Education: N/A   Occupational History  . cosmetic consultant     [employer]   Social History Main Topics  . Smoking status: Never Smoker   . Smokeless tobacco: Not on file  . Alcohol Use: No  . Drug Use: No  . Sexually Active: Not on file   Other Topics Concern  . Not on file   Social History Narrative   Widowed, husband died in 15Works at [employer] as a Risk manager consultant3 children, 1 died from homicideShe lives with another son and his wife---he has second double lung transplant after complications of MVA.    Current Outpatient Prescriptions on File Prior to Visit  Medication Sig Dispense Refill  . albuterol (PROAIR HFA) 108 (90 BASE) MCG/ACT inhaler Inhale 2 puffs into the lungs every 6 (six) hours as needed. Shortness of breath      . beclomethasone (QVAR) 80 MCG/ACT inhaler Inhale 2 puffs into the lungs 2 (two) times daily.        Marland Kitchen esomeprazole (NEXIUM) 40 MG capsule Take 1 capsule (40 mg total) by mouth daily.  90 capsule  3  . fluticasone (FLONASE) 50 MCG/ACT nasal spray        . Menthol, Topical Analgesic, 1 % AERP Apply 1 application topically as needed. Patient uses 3 to 4 times weekly       . NON FORMULARY Patient received allergy shots once per week.        Marland Kitchen OVER THE COUNTER MEDICATION Apply 1 drop to eye 2 (two) times daily. Over the counter eye drop for tear ducts       . oxymetazoline (AFRIN) 0.05 % nasal spray Place 1 spray into the nose 2 (two) times daily as needed. As needed for nasal problems      . traMADol (ULTRAM) 50 MG tablet Take 1 tablet (50 mg total) by mouth every 8 (eight) hours as needed. Maximum dose= 8 tablets per day  30 tablet  0  . zafirlukast (ACCOLATE) 10 MG tablet Take 10 mg by mouth 2 (two) times daily.        Marland Kitchen omeprazole (PRILOSEC) 20 MG capsule Take 20 mg by mouth every morning.        Marland Kitchen DISCONTD: beclomethasone (QVAR) 80 MCG/ACT inhaler Inhale 1 puff into the lungs as needed.  1 Inhaler  12    Allergies  Allergen Reactions  . Promethazine-Codeine Nausea And Vomiting    No family history on file.  BP 128/88  Pulse 89  Temp(Src) 98.1 F (36.7 C) (Oral)  SpO2 99%   Review of  Systems Denies fever, but she has chills.  Denies brbpr.    Objective:   Physical Exam VITAL SIGNS:  See vs page. GENERAL: no distress. ABDOMEN: abdomen is soft, nontender.  no hepatosplenomegaly.  not distended.  no hernia.       Assessment & Plan:  Acute gastroenteritis, new Phenergan 25 mg IM

## 2012-05-02 NOTE — Patient Instructions (Addendum)
i have sent a prescription to [facility], for nausea. I hope you feel better soon.  If you don't feel better by next week, please call back.  Please call sooner if you get worse. Drink plenty of fluids.

## 2012-05-10 ENCOUNTER — Ambulatory Visit (INDEPENDENT_AMBULATORY_CARE_PROVIDER_SITE_OTHER): Payer: Self-pay

## 2012-05-10 DIAGNOSIS — J309 Allergic rhinitis, unspecified: Secondary | ICD-10-CM

## 2012-05-22 ENCOUNTER — Ambulatory Visit (INDEPENDENT_AMBULATORY_CARE_PROVIDER_SITE_OTHER): Payer: Self-pay

## 2012-05-22 DIAGNOSIS — J309 Allergic rhinitis, unspecified: Secondary | ICD-10-CM

## 2012-05-29 ENCOUNTER — Ambulatory Visit (INDEPENDENT_AMBULATORY_CARE_PROVIDER_SITE_OTHER): Payer: Self-pay

## 2012-05-29 DIAGNOSIS — J309 Allergic rhinitis, unspecified: Secondary | ICD-10-CM

## 2012-06-12 ENCOUNTER — Ambulatory Visit (INDEPENDENT_AMBULATORY_CARE_PROVIDER_SITE_OTHER): Payer: Self-pay

## 2012-06-12 DIAGNOSIS — J309 Allergic rhinitis, unspecified: Secondary | ICD-10-CM

## 2012-07-08 ENCOUNTER — Telehealth: Payer: Self-pay | Admitting: Internal Medicine

## 2012-07-08 MED ORDER — ZAFIRLUKAST 10 MG PO TABS: 10.0000 mg | ORAL_TABLET | Freq: Two times a day (BID) | ORAL | Status: DC

## 2012-07-08 NOTE — Telephone Encounter (Signed)
rx has been sent and pt aware she needs [facility] for further refills.

## 2012-08-15 ENCOUNTER — Encounter: Payer: Self-pay | Admitting: Internal Medicine

## 2012-08-15 ENCOUNTER — Ambulatory Visit (INDEPENDENT_AMBULATORY_CARE_PROVIDER_SITE_OTHER)
Admission: RE | Admit: 2012-08-15 | Discharge: 2012-08-15 | Disposition: A | Discharge status: Home / Self Care | Payer: Self-pay | Source: Ambulatory Visit | Attending: Internal Medicine | Admitting: Internal Medicine

## 2012-08-15 ENCOUNTER — Other Ambulatory Visit (INDEPENDENT_AMBULATORY_CARE_PROVIDER_SITE_OTHER): Payer: Self-pay

## 2012-08-15 ENCOUNTER — Ambulatory Visit (INDEPENDENT_AMBULATORY_CARE_PROVIDER_SITE_OTHER): Payer: Self-pay | Admitting: Internal Medicine

## 2012-08-15 VITALS — BP 116/80 | HR 85 | Temp 98.6°F | Resp 16 | Wt 201.0 lb

## 2012-08-15 DIAGNOSIS — M79609 Pain in unspecified limb: Secondary | ICD-10-CM

## 2012-08-15 DIAGNOSIS — M79604 Pain in right leg: Secondary | ICD-10-CM

## 2012-08-15 DIAGNOSIS — J328 Other chronic sinusitis: Secondary | ICD-10-CM

## 2012-08-15 DIAGNOSIS — R739 Hyperglycemia, unspecified: Secondary | ICD-10-CM | POA: Insufficient documentation

## 2012-08-15 DIAGNOSIS — J309 Allergic rhinitis, unspecified: Secondary | ICD-10-CM

## 2012-08-15 DIAGNOSIS — E876 Hypokalemia: Secondary | ICD-10-CM

## 2012-08-15 DIAGNOSIS — M7989 Other specified soft tissue disorders: Secondary | ICD-10-CM

## 2012-08-15 DIAGNOSIS — I10 Essential (primary) hypertension: Secondary | ICD-10-CM

## 2012-08-15 DIAGNOSIS — R7309 Other abnormal glucose: Secondary | ICD-10-CM

## 2012-08-15 LAB — CBC WITH DIFFERENTIAL/PLATELET
Basophils Relative: 0.8 % (ref 0.0–3.0)
Eosinophils Relative: 14.7 % — ABNORMAL HIGH (ref 0.0–5.0)
HCT: 38.8 % (ref 36.0–46.0)
Lymphs Abs: 1.9 10*3/uL (ref 0.7–4.0)
MCV: 90.4 fl (ref 78.0–100.0)
Monocytes Absolute: 0.4 10*3/uL (ref 0.1–1.0)
RBC: 4.29 Mil/uL (ref 3.87–5.11)
WBC: 6.3 10*3/uL (ref 4.5–10.5)

## 2012-08-15 LAB — HEMOGLOBIN A1C: Hgb A1c MFr Bld: 5.6 % (ref 4.6–6.5)

## 2012-08-15 LAB — COMPREHENSIVE METABOLIC PANEL
Albumin: 3.8 g/dL (ref 3.5–5.2)
Alkaline Phosphatase: 114 U/L (ref 39–117)
BUN: 8 mg/dL (ref 6–23)
GFR: 108.14 mL/min (ref >60.00)
Glucose, Bld: 87 mg/dL (ref 70–99)
Potassium: 3.4 mEq/L — ABNORMAL LOW (ref 3.5–5.1)

## 2012-08-15 MED ORDER — METHYLPREDNISOLONE ACETATE 80 MG/ML IJ SUSP
120.0000 mg | Freq: Once | INTRAMUSCULAR | Status: AC
  Administered 2012-08-15: 120 mg via INTRAMUSCULAR

## 2012-08-15 MED ORDER — AMOXICILLIN 875 MG PO TABS: 875.0000 mg | ORAL_TABLET | Freq: Two times a day (BID) | ORAL | Status: AC

## 2012-08-15 NOTE — Assessment & Plan Note (Signed)
I will recheck her K+ level today 

## 2012-08-15 NOTE — Patient Instructions (Signed)

## 2012-08-15 NOTE — Progress Notes (Signed)
Subjective:    Patient ID: Andrea Mason, female    DOB: [birth year], 61 y.o.   MRN: [id number]  Sinusitis This is a recurrent problem. The current episode started 1 to 4 weeks ago. The problem has been gradually worsening since onset. There has been no fever. The fever has been present for less than 1 day. Her pain is at a severity of 0/10. She is experiencing no pain. Associated symptoms include congestion, sinus pressure and sneezing. Pertinent negatives include no chills, coughing, diaphoresis, ear pain, headaches, hoarse voice, neck pain, shortness of breath, sore throat or swollen glands. Past treatments include nothing. The treatment provided no relief.      Review of Systems  Constitutional: Negative for fever, chills, diaphoresis, activity change, appetite change, fatigue and unexpected weight change.  HENT: Positive for congestion, rhinorrhea, sneezing, postnasal drip and sinus pressure. Negative for ear pain, sore throat, hoarse voice, facial swelling, trouble swallowing, neck pain, voice change and tinnitus.   Eyes: Negative.   Respiratory: Negative for apnea, cough, choking, chest tightness, shortness of breath, wheezing and stridor.   Cardiovascular: Positive for leg swelling (localized area of swelling just below her right knee). Negative for chest pain and palpitations.  Gastrointestinal: Negative for nausea, vomiting, abdominal pain, diarrhea, constipation and blood in stool.  Genitourinary: Negative.   Musculoskeletal: Positive for arthralgias (right lower leg pain for 5 days - no trauma). Negative for myalgias, back pain, joint swelling and gait problem.  Skin: Negative for color change, pallor, rash and wound.  Neurological: Negative.  Negative for headaches.  Hematological: Negative for adenopathy. Does not bruise/bleed easily.  Psychiatric/Behavioral: Negative.        Objective:   Physical Exam  Vitals reviewed. Constitutional: She is oriented to person, place, and  time. She appears well-developed and well-nourished.  Non-toxic appearance. She does not have a sickly appearance. She does not appear ill. No distress.  HENT:  Head: Normocephalic and atraumatic.  Mouth/Throat: Oropharynx is clear and moist. No oropharyngeal exudate.  Eyes: Conjunctivae normal are normal. Right eye exhibits no discharge. Left eye exhibits no discharge. No scleral icterus.  Neck: Normal range of motion. Neck supple. No JVD present. No tracheal deviation present. No thyromegaly present.  Cardiovascular: Normal rate, normal heart sounds and intact distal pulses.  Exam reveals no gallop and no friction rub.   No murmur heard. Pulmonary/Chest: Effort normal and breath sounds normal. No stridor. No respiratory distress. She has no wheezes. She has no rales. She exhibits no tenderness.  Abdominal: Soft. Bowel sounds are normal. She exhibits no distension and no mass. There is no tenderness. There is no rebound and no guarding.  Musculoskeletal: Normal range of motion. She exhibits edema and tenderness.       Right lower leg: She exhibits tenderness and swelling. She exhibits no bony tenderness, no edema, no deformity and no laceration.       Legs: Lymphadenopathy:    She has no cervical adenopathy.  Neurological: She is alert and oriented to person, place, and time. She has normal reflexes. She displays normal reflexes. No cranial nerve deficit. She exhibits normal muscle tone. Coordination normal.  Skin: Skin is warm and dry. No abrasion, no bruising, no burn, no ecchymosis, no laceration, no lesion, no petechiae and no rash noted. She is not diaphoretic. No erythema. No pallor.  Psychiatric: She has a normal mood and affect. Her behavior is normal. Judgment and thought content normal.     Lab Results  Component Value Date  WBC 3.7* 11/20/2011   HGB 12.8 11/20/2011   HCT 38.6 11/20/2011   PLT 187.0 11/20/2011   GLUCOSE 105* 07/27/2010   CHOL 104 07/27/2010   TRIG 102.0  07/27/2010   HDL 47.40 07/27/2010   LDLCALC 36 07/27/2010   ALT 19 07/27/2010   AST 19 07/27/2010   NA 143 07/27/2010   K 3.2* 07/27/2010   CL 99 07/27/2010   CREATININE 0.7 07/27/2010   BUN 8 07/27/2010   CO2 35* 07/27/2010   TSH 1.63 07/27/2010   HGBA1C 5.8 07/07/2009       Assessment & Plan:

## 2012-08-15 NOTE — Assessment & Plan Note (Signed)
I will check her a1c today to see if she has DM II 

## 2012-08-15 NOTE — Assessment & Plan Note (Signed)
I will check a d-dimer to see if she has a DVT

## 2012-08-15 NOTE — Assessment & Plan Note (Signed)
Her BP is well controlled, I will check her lytes and renal function 

## 2012-08-15 NOTE — Assessment & Plan Note (Signed)
She is having a flare of s/s so I gave her an injection of depo-medrol IM today

## 2012-08-15 NOTE — Assessment & Plan Note (Signed)
She appears to be having a bacterial infection so I have asked her to start amoxil

## 2012-08-15 NOTE — Addendum Note (Signed)
Addended by: Rock Nephew T on: 08/15/2012 02:23 PM   Modules accepted: Orders

## 2012-08-15 NOTE — Assessment & Plan Note (Signed)
Plain film today to see if she has an occult fracture

## 2012-08-16 ENCOUNTER — Encounter: Payer: Self-pay | Admitting: Internal Medicine

## 2012-08-16 LAB — D-DIMER, QUANTITATIVE: D-Dimer, Quant: 0.27 ug/mL-FEU (ref 0.00–0.48)

## 2012-08-19 ENCOUNTER — Telehealth: Payer: Self-pay | Admitting: Internal Medicine

## 2012-08-19 NOTE — Telephone Encounter (Signed)
Patient is calling to get the results from her testing on thursday

## 2012-08-29 ENCOUNTER — Telehealth: Payer: Self-pay | Admitting: General Practice

## 2012-08-29 NOTE — Telephone Encounter (Signed)
Ok for accolate - to robin to handle  Leg xray neg sept 16 is neg for any acute problem such as fracture

## 2012-08-29 NOTE — Telephone Encounter (Signed)
Pt called stating that she need her Rx for Accolate sent to [facility] fax # [phone]. Dr. Jonny Ruiz wrote the original Rx. Also would like to know if paperwork states anything on her leg X-ray. Seen Dr. Yetta Barre on 08/15/12. Please call pt back. Thanks.

## 2012-08-30 ENCOUNTER — Telehealth: Payer: Self-pay | Admitting: Internal Medicine

## 2012-08-30 MED ORDER — ZAFIRLUKAST 10 MG PO TABS: 10.0000 mg | ORAL_TABLET | Freq: Two times a day (BID) | ORAL | Status: DC

## 2012-08-30 NOTE — Telephone Encounter (Signed)
Caller: [name]/Patient; Patient Name: Andrea Mason; PCP: Oliver Barre (Adults only); Best Callback Phone Number: [phone].  Patient calling to return call to Robin.  States wants results of xray of right  leg.  States still has some mild swelling but pain is improved.  Denies current pain on walking, but states there is an ache which is present all the time.  R ankle is more swollen than the left, but states is worse at the end of the day, then improved every morning on arising.  States has been present x 2 weeks.  States her mother had elephantiasis and wonders if this may be the case with her as well.   Per leg pain protocol, emergent symptoms denied; advised appointment within 2 weeks for follow up.  Appointment scheduled in office 09/03/12 1100 with Dr. Jonny Ruiz.

## 2012-08-30 NOTE — Telephone Encounter (Signed)
Faxed hardcopy to pharmacy pt. Requested.

## 2012-08-30 NOTE — Telephone Encounter (Signed)
accolate rx - Done hardcopy to robin

## 2012-08-30 NOTE — Telephone Encounter (Signed)
Called left message to call back 

## 2012-08-30 NOTE — Telephone Encounter (Signed)
Patient informed of xray results and she informed that she is scheduled to see PCP on Tuesday 09/03/12.

## 2012-09-03 ENCOUNTER — Encounter: Payer: Self-pay | Admitting: Internal Medicine

## 2012-09-03 ENCOUNTER — Ambulatory Visit (INDEPENDENT_AMBULATORY_CARE_PROVIDER_SITE_OTHER): Payer: Self-pay | Admitting: Internal Medicine

## 2012-09-03 VITALS — BP 132/90 | HR 64 | Temp 97.1°F | Ht 62.0 in | Wt 204.0 lb

## 2012-09-03 DIAGNOSIS — S93409A Sprain of unspecified ligament of unspecified ankle, initial encounter: Secondary | ICD-10-CM

## 2012-09-03 DIAGNOSIS — E876 Hypokalemia: Secondary | ICD-10-CM

## 2012-09-03 DIAGNOSIS — Z23 Encounter for immunization: Secondary | ICD-10-CM

## 2012-09-03 DIAGNOSIS — S93402A Sprain of unspecified ligament of left ankle, initial encounter: Secondary | ICD-10-CM

## 2012-09-03 DIAGNOSIS — J328 Other chronic sinusitis: Secondary | ICD-10-CM

## 2012-09-03 DIAGNOSIS — D179 Benign lipomatous neoplasm, unspecified: Secondary | ICD-10-CM | POA: Insufficient documentation

## 2012-09-03 MED ORDER — POTASSIUM CHLORIDE ER 10 MEQ PO TBCR: 10.0000 meq | EXTENDED_RELEASE_TABLET | Freq: Two times a day (BID) | ORAL | Status: DC

## 2012-09-03 NOTE — Assessment & Plan Note (Signed)
Mild persistent, unclear etiology , for klor con 10 qd

## 2012-09-03 NOTE — Assessment & Plan Note (Signed)
Just below right knee  - lipoma vs nonspecific fatty deposit,  to f/u any worsening symptoms or concerns, no further eval or tx at this time

## 2012-09-03 NOTE — Assessment & Plan Note (Signed)
Improved,  to f/u any worsening symptoms or concerns, Continue all other medications as before  

## 2012-09-03 NOTE — Progress Notes (Signed)
Subjective:    Patient ID: Andrea Mason, female    DOB: 26-May-1951, 61 y.o.   MRN: [id number]  HPI  Here to f/u, last saw Dr Yetta Barre recently with amoxil for sinus symptoms now improved, neg tib/fib film and fatty mass to leg just below the knee persists without pain or worsening, lab with low K persistent.  No new complaints. Past Medical History  Diagnosis Date  . Hypertension   . GERD (gastroesophageal reflux disease)   . Allergic rhinitis   . Asthma   . Obesity   . Depression   . History of colonic polyps   . DJD (degenerative joint disease), cervical    Past Surgical History  Procedure Date  . Nasal sinus surgery 07/2006    Dr. Annalee Genta  . Nasal polyp surgery 07/2006    x 2 with Dr. Annalee Genta  . Foot surgery 1998  . Tubal ligation     reports that she has never smoked. She does not have any smokeless tobacco history on file. She reports that she does not drink alcohol or use illicit drugs. family history is not on file. Allergies  Allergen Reactions  . Promethazine-Codeine Nausea And Vomiting   Current Outpatient Prescriptions on File Prior to Visit  Medication Sig Dispense Refill  . albuterol (PROAIR HFA) 108 (90 BASE) MCG/ACT inhaler Inhale 2 puffs into the lungs every 6 (six) hours as needed. Shortness of breath      . esomeprazole (NEXIUM) 40 MG capsule Take 1 capsule (40 mg total) by mouth daily.  90 capsule  3  . fluticasone (FLONASE) 50 MCG/ACT nasal spray        . Menthol, Topical Analgesic, 1 % AERP Apply 1 application topically as needed. Patient uses 3 to 4 times weekly       . NON FORMULARY Patient received allergy shots once per week.       Marland Kitchen omeprazole (PRILOSEC) 20 MG capsule Take 20 mg by mouth every morning.        Marland Kitchen OVER THE COUNTER MEDICATION Apply 1 drop to eye 2 (two) times daily. Over the counter eye drop for tear ducts       . oxymetazoline (AFRIN) 0.05 % nasal spray Place 1 spray into the nose 2 (two) times daily as needed. As needed for nasal  problems      . traMADol (ULTRAM) 50 MG tablet Take 1 tablet (50 mg total) by mouth every 8 (eight) hours as needed. Maximum dose= 8 tablets per day  30 tablet  0  . zafirlukast (ACCOLATE) 10 MG tablet Take 1 tablet (10 mg total) by mouth 2 (two) times daily.  60 tablet  1  . zafirlukast (ACCOLATE) 10 MG tablet Take 1 tablet (10 mg total) by mouth 2 (two) times daily.  180 tablet  3  . beclomethasone (QVAR) 80 MCG/ACT inhaler Inhale 2 puffs into the lungs 2 (two) times daily.        . potassium chloride (KLOR-CON 10) 10 MEQ tablet Take 1 tablet (10 mEq total) by mouth 2 (two) times daily.  90 tablet  3   Review of Systems  All otherwise neg per pt    Objective:   Physical Exam BP 132/90  Pulse 64  Temp 97.1 F (36.2 C) (Oral)  Ht 5\' 2"  (1.575 m)  Wt 204 lb (92.534 kg)  BMI 37.31 kg/m2  SpO2 99% Physical Exam  VS noted Constitutional: Pt appears well-developed and well-nourished.  HENT: Head: Normocephalic.  Right Ear: External  ear normal.  Left Ear: External ear normal.  Bilat tm's mild erythema.  Sinus nontender.  Pharynx mild erythema Eyes: Conjunctivae and EOM are normal. Pupils are equal, round, and reactive to light.  Neck: Normal range of motion. Neck supple.  Cardiovascular: Normal rate and regular rhythm.   Pulmonary/Chest: Effort normal and breath sounds normal.  Neurological: Pt is alert. Not confused  Skin: Skin is warm. No erythema.  Right leg with lipoma like mass 2 cm nondiscrete to leg just below the right knee Psychiatric: Pt behavior is normal. Thought content normal 1+ nervous    Assessment & Plan:

## 2012-09-03 NOTE — Patient Instructions (Addendum)
You had the flu shot today Take all new medications as prescribed - the potassium pill Please Go to Healthcare.gov to look into [employer] Continue all other medications as before Please have the pharmacy call with any refills you may need. Please return in 9 months, or sooner if needed

## 2012-09-03 NOTE — Assessment & Plan Note (Signed)
Resolved,  to f/u any worsening symptoms or concerns, reassured

## 2012-11-08 ENCOUNTER — Telehealth: Payer: Self-pay | Admitting: Internal Medicine
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
## 2012-11-09 ENCOUNTER — Ambulatory Visit (INDEPENDENT_AMBULATORY_CARE_PROVIDER_SITE_OTHER): Payer: Self-pay | Admitting: Family

## 2012-11-09 ENCOUNTER — Encounter: Payer: Self-pay | Admitting: Family

## 2012-11-09 VITALS — BP 130/90 | HR 71 | Temp 96.9°F | Resp 16 | Wt 210.0 lb

## 2012-11-09 DIAGNOSIS — M25519 Pain in unspecified shoulder: Secondary | ICD-10-CM

## 2012-11-09 DIAGNOSIS — M79609 Pain in unspecified limb: Secondary | ICD-10-CM

## 2012-11-09 DIAGNOSIS — M5412 Radiculopathy, cervical region: Secondary | ICD-10-CM

## 2012-11-09 DIAGNOSIS — M25511 Pain in right shoulder: Secondary | ICD-10-CM

## 2012-11-09 DIAGNOSIS — M79601 Pain in right arm: Secondary | ICD-10-CM

## 2012-11-09 MED ORDER — DICLOFENAC SODIUM 75 MG PO TBEC: 75.0000 mg | DELAYED_RELEASE_TABLET | Freq: Two times a day (BID) | ORAL | Status: DC
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
## 2012-11-09 NOTE — Patient Instructions (Addendum)
Voltaren twice a day with food!!   Cervical Radiculopathy Cervical radiculopathy happens when a nerve in the neck is pinched or bruised by a slipped (herniated) disk or by arthritic changes in the bones of the cervical spine. This can occur due to an injury or as part of the normal aging process. Pressure on the cervical nerves can cause pain or numbness that runs from your neck all the way down into your arm and fingers. CAUSES  There are many possible causes, including:  Injury.  Muscle tightness in the neck from overuse.  Swollen, painful joints (arthritis).  Breakdown or degeneration in the bones and joints of the spine (spondylosis) due to aging.  Bone spurs that may develop near the cervical nerves. SYMPTOMS  Symptoms include pain, weakness, or numbness in the affected arm and hand. Pain can be severe or irritating. Symptoms may be worse when extending or turning the neck. DIAGNOSIS  Your caregiver will ask about your symptoms and do a physical exam. He or she may test your strength and reflexes. X-rays, CT scans, and MRI scans may be needed in cases of injury or if the symptoms do not go away after a period of time. Electromyography (EMG) or nerve conduction testing may be done to study how your nerves and muscles are working. TREATMENT  Your caregiver may recommend certain exercises to help relieve your symptoms. Cervical radiculopathy can, and often does, get better with time and treatment. If your problems continue, treatment options may include:  Wearing a soft collar for short periods of time.  Physical therapy to strengthen the neck muscles.  Medicines, such as nonsteroidal anti-inflammatory drugs (NSAIDs), oral corticosteroids, or spinal injections.  Surgery. Different types of surgery may be done depending on the cause of your problems. HOME CARE INSTRUCTIONS   Put ice on the affected area.  Put ice in a plastic bag.  Place a towel between your skin and the  bag.  Leave the ice on for 15 to 20 minutes, 3 to 4 times a day or as directed by your caregiver.  If ice does not help, you can try using heat. Take a warm shower or bath, or use a hot water bottle as directed by your caregiver.  You may try a gentle neck and shoulder massage.  Use a flat pillow when you sleep.  Only take over-the-counter or prescription medicines for pain, discomfort, or fever as directed by your caregiver.  If physical therapy was prescribed, follow your caregiver's directions.  If a soft collar was prescribed, use it as directed. SEEK IMMEDIATE MEDICAL CARE IF:   Your pain gets much worse and cannot be controlled with medicines.  You have weakness or numbness in your hand, arm, face, or leg.  You have a high fever or a stiff, rigid neck.  You lose bowel or bladder control (incontinence).  You have trouble with walking, balance, or speaking. MAKE SURE YOU:   Understand these instructions.  Will watch your condition.  Will get help right away if you are not doing well or get worse. Document Released: 08/15/2001 Document Revised: 02/12/2012 Document Reviewed: 07/04/2011 [facility] Patient Information 2013 [place], [place].

## 2012-12-21 ENCOUNTER — Encounter (HOSPITAL_COMMUNITY): Payer: Self-pay | Admitting: Emergency Medicine

## 2012-12-21 ENCOUNTER — Emergency Department (HOSPITAL_COMMUNITY): Payer: Self-pay

## 2012-12-21 ENCOUNTER — Emergency Department (HOSPITAL_COMMUNITY)
Admission: EM | Admit: 2012-12-21 | Discharge: 2012-12-21 | Disposition: A | Discharge status: Home / Self Care | Payer: Self-pay | Attending: Emergency Medicine | Admitting: Emergency Medicine

## 2012-12-21 DIAGNOSIS — R42 Dizziness and giddiness: Secondary | ICD-10-CM | POA: Insufficient documentation

## 2012-12-21 DIAGNOSIS — K219 Gastro-esophageal reflux disease without esophagitis: Secondary | ICD-10-CM | POA: Insufficient documentation

## 2012-12-21 DIAGNOSIS — Z8659 Personal history of other mental and behavioral disorders: Secondary | ICD-10-CM | POA: Insufficient documentation

## 2012-12-21 DIAGNOSIS — R51 Headache: Secondary | ICD-10-CM | POA: Insufficient documentation

## 2012-12-21 DIAGNOSIS — J309 Allergic rhinitis, unspecified: Secondary | ICD-10-CM | POA: Insufficient documentation

## 2012-12-21 DIAGNOSIS — Z8739 Personal history of other diseases of the musculoskeletal system and connective tissue: Secondary | ICD-10-CM | POA: Insufficient documentation

## 2012-12-21 DIAGNOSIS — I10 Essential (primary) hypertension: Secondary | ICD-10-CM | POA: Insufficient documentation

## 2012-12-21 DIAGNOSIS — R110 Nausea: Secondary | ICD-10-CM | POA: Insufficient documentation

## 2012-12-21 DIAGNOSIS — Z860100 Personal history of colon polyps, unspecified: Secondary | ICD-10-CM | POA: Insufficient documentation

## 2012-12-21 DIAGNOSIS — R05 Cough: Secondary | ICD-10-CM | POA: Insufficient documentation

## 2012-12-21 DIAGNOSIS — J45909 Unspecified asthma, uncomplicated: Secondary | ICD-10-CM | POA: Insufficient documentation

## 2012-12-21 DIAGNOSIS — E669 Obesity, unspecified: Secondary | ICD-10-CM | POA: Insufficient documentation

## 2012-12-21 DIAGNOSIS — R059 Cough, unspecified: Secondary | ICD-10-CM | POA: Insufficient documentation

## 2012-12-21 DIAGNOSIS — H9209 Otalgia, unspecified ear: Secondary | ICD-10-CM | POA: Insufficient documentation

## 2012-12-21 MED ORDER — GUAIFENESIN-CODEINE 100-10 MG/5ML PO SYRP: 5.0000 mL | ORAL_SOLUTION | Freq: Three times a day (TID) | ORAL | Status: DC | PRN

## 2012-12-21 MED ORDER — ONDANSETRON 4 MG PO TBDP: 4.0000 mg | ORAL_TABLET | Freq: Three times a day (TID) | ORAL | Status: DC | PRN

## 2012-12-21 NOTE — ED Notes (Signed)
Reports came to the ED due to being sick with nausea, coughing, headache,chills, nasal drainage, left ear ache & dizzy onset today.  Started coughing yesterday.

## 2012-12-21 NOTE — ED Provider Notes (Signed)
Medical screening examination/treatment/procedure(s) were performed by non-physician practitioner and as supervising physician I was immediately available for consultation/collaboration.  Feliciana Narayan R. Yahel Fuston, MD 12/21/12 2314 

## 2012-12-21 NOTE — ED Provider Notes (Signed)
History  Scribed for [name], PA-C/ Juliet Rude. Rubin Payor, MD, the patient was seen in room WTR6/WTR6. This chart was scribed by Candelaria Stagers. The patient's care started at 5:20 PM   CSN: [id number]  Arrival date & time 12/21/12  1556   First MD Initiated Contact with Patient 12/21/12 1655      Chief Complaint  Patient presents with  . Headache  . Dizziness  . Nausea     The history is provided by the patient. No language interpreter was used.   Andrea Mason is a 62 y.o. female who presents to the Emergency Department complaining of a cough that started yesterday and nausea, ear pain, and headache that started today.  Pt is also experiencing  Dizziness intermittently that is mild.  She denies sore throat.  Nothing seems to make the sx better or worse.  Pt did have the flu shot.  Pt has h/o asthma.  Pt does not return to work for one week.     Past Medical History  Diagnosis Date  . Hypertension   . GERD (gastroesophageal reflux disease)   . Allergic rhinitis   . Asthma   . Obesity   . Depression   . History of colonic polyps   . DJD (degenerative joint disease), cervical     Past Surgical History  Procedure Date  . Nasal sinus surgery 07/2006    Dr. Annalee Genta  . Nasal polyp surgery 07/2006    x 2 with Dr. Annalee Genta  . Foot surgery 1998  . Tubal ligation     History reviewed. No pertinent family history.  History  Substance Use Topics  . Smoking status: Never Smoker   . Smokeless tobacco: Not on file  . Alcohol Use: No    OB History    Grav Para Term Preterm Abortions TAB SAB Ect Mult Living                  Review of Systems  Constitutional: Negative for fever and chills.  HENT: Positive for ear pain (left ear pain). Negative for sore throat.   Respiratory: Positive for cough. Negative for shortness of breath.   Gastrointestinal: Positive for nausea. Negative for vomiting and diarrhea.  Neurological: Positive for dizziness. Negative for weakness.    All other systems reviewed and are negative.    Allergies  Ivp dye and Promethazine-codeine  Home Medications   Current Outpatient Rx  Name  Route  Sig  Dispense  Refill  . ALBUTEROL SULFATE HFA 108 (90 BASE) MCG/ACT IN AERS   Inhalation   Inhale 2 puffs into the lungs every 6 (six) hours as needed. Shortness of breath         . BECLOMETHASONE DIPROPIONATE 80 MCG/ACT IN AERS   Inhalation   Inhale 1 puff into the lungs 2 (two) times daily.          Marland Kitchen CETIRIZINE HCL 10 MG PO TABS   Oral   Take 10 mg by mouth daily as needed. For allergies         . DICLOFENAC SODIUM 75 MG PO TBEC   Oral   Take 1 tablet (75 mg total) by mouth 2 (two) times daily.   30 tablet   0   . FLUTICASONE PROPIONATE 50 MCG/ACT NA SUSP   Nasal   Place 1 spray into the nose daily.          . NON FORMULARY      Patient received allergy shots once per  week.          Marland Kitchen OMEPRAZOLE 20 MG PO CPDR   Oral   Take 20 mg by mouth every morning.           Marland Kitchen POTASSIUM CHLORIDE ER 10 MEQ PO TBCR   Oral   Take 1 tablet (10 mEq total) by mouth 2 (two) times daily.   90 tablet   3   . ZAFIRLUKAST 10 MG PO TABS   Oral   Take 1 tablet (10 mg total) by mouth 2 (two) times daily.   180 tablet   3   . GUAIFENESIN-CODEINE 100-10 MG/5ML PO SYRP   Oral   Take 5 mLs by mouth 3 (three) times daily as needed for cough.   120 mL   0   . ONDANSETRON 4 MG PO TBDP   Oral   Take 1 tablet (4 mg total) by mouth every 8 (eight) hours as needed for nausea.   20 tablet   0     BP 151/97  Pulse 84  Temp 98.6 F (37 C)  Resp 16  Ht 5\' 1"  (1.549 m)  Wt 205 lb (92.987 kg)  BMI 38.73 kg/m2  SpO2 100%  Physical Exam  Nursing note and vitals reviewed. Constitutional: She is oriented to person, place, and time. She appears well-developed and well-nourished. No distress.  HENT:  Head: Normocephalic and atraumatic.  Right Ear: Tympanic membrane normal.  Left Ear: Tympanic membrane normal.   Mouth/Throat: Oropharynx is clear and moist. No oropharyngeal exudate.  Eyes: EOM are normal.  Neck: Neck supple. No tracheal deviation present.  Cardiovascular: Normal rate and regular rhythm.   Pulmonary/Chest: Effort normal. No respiratory distress. She has no wheezes. She has no rales.       Coughing during exam  Musculoskeletal: Normal range of motion.  Neurological: She is alert and oriented to person, place, and time.  Skin: Skin is warm and dry.  Psychiatric: She has a normal mood and affect. Her behavior is normal.    ED Course  Procedures  DIAGNOSTIC STUDIES: Oxygen Saturation is 100% on room air, normal by my interpretation.    COORDINATION OF CARE:  4:56PM Ordered: DG Chest 2 View 5:23PM Will treat symptomatically for the flu.  Will review imaging results.  Discussed what sx would bring her back to the ED.  Pt understands and agrees.     Labs Reviewed - No data to display Dg Chest 2 View  12/21/2012  *RADIOLOGY REPORT*  Clinical Data: Flu symptoms.  Cough.  Fever.  Congestion.  CHEST - 2 VIEW  Comparison: 11/20/2011  Findings: Midline trachea.  Mild cardiomegaly with a tortuous thoracic aorta. No pleural effusion or pneumothorax.  Diffuse peribronchial thickening.  Clear lungs.  IMPRESSION: No acute cardiopulmonary disease.  Cardiomegaly without congestive failure.  Chronic interstitial thickening, possibly related to asthma or chronic bronchitis.   Original Report Authenticated By: Jeronimo Greaves, M.D.      1. Influenza-like illness       MDM  Rx Cheritussin and zofran. Work note given.   Pt has been advised of the symptoms that warrant their return to the ED. Patient has voiced understanding and has agreed to follow-up with the PCP or specialist.         Dorthula Matas, PA 12/21/12 2208

## 2013-01-04 ENCOUNTER — Encounter (HOSPITAL_COMMUNITY): Payer: Self-pay | Admitting: Emergency Medicine

## 2013-01-04 ENCOUNTER — Emergency Department (HOSPITAL_COMMUNITY)
Admission: EM | Admit: 2013-01-04 | Discharge: 2013-01-05 | Disposition: A | Discharge status: Home / Self Care | Payer: Self-pay | Attending: Emergency Medicine | Admitting: Emergency Medicine

## 2013-01-04 DIAGNOSIS — I10 Essential (primary) hypertension: Secondary | ICD-10-CM | POA: Insufficient documentation

## 2013-01-04 DIAGNOSIS — Z79899 Other long term (current) drug therapy: Secondary | ICD-10-CM | POA: Insufficient documentation

## 2013-01-04 DIAGNOSIS — M5090 Cervical disc disorder, unspecified, unspecified cervical region: Secondary | ICD-10-CM | POA: Insufficient documentation

## 2013-01-04 DIAGNOSIS — Z860100 Personal history of colon polyps, unspecified: Secondary | ICD-10-CM | POA: Insufficient documentation

## 2013-01-04 DIAGNOSIS — J45909 Unspecified asthma, uncomplicated: Secondary | ICD-10-CM | POA: Insufficient documentation

## 2013-01-04 DIAGNOSIS — L539 Erythematous condition, unspecified: Secondary | ICD-10-CM | POA: Insufficient documentation

## 2013-01-04 DIAGNOSIS — Z8659 Personal history of other mental and behavioral disorders: Secondary | ICD-10-CM | POA: Insufficient documentation

## 2013-01-04 DIAGNOSIS — K219 Gastro-esophageal reflux disease without esophagitis: Secondary | ICD-10-CM | POA: Insufficient documentation

## 2013-01-04 DIAGNOSIS — E669 Obesity, unspecified: Secondary | ICD-10-CM | POA: Insufficient documentation

## 2013-01-04 NOTE — ED Notes (Signed)
Pt alert, arrives from church, c/o swelling to right hand, onset this evening, denies trauma or injury, denies exposures, resp even unlabored, skin pwd, area red and swollen

## 2013-01-04 NOTE — ED Provider Notes (Signed)
History   This chart was scribed for non-physician practitioner working with Andrea Horn, MD by Andrea Mason, ED Scribe. This patient was seen in room WTR8/WTR8 and the patient's care was started at 11:59.   CSN: [id number]  Arrival date & time 01/04/13  2239   None     Chief Complaint  Patient presents with  . Hand Pain    (Consider location/radiation/quality/duration/timing/severity/associated sxs/prior treatment) The history is provided by the patient. No language interpreter was used.    Andrea Mason is a 62 y.o. female who presents to the Emergency Department complaining of sudden onset, gradually worsening non-radiating right hand pain with associated redness and swelling that is not aggravated or improved by anything and began at 22:30 while she was at a concert. She denies any trauma or injury to the area. She denies pain with moving her wrist or seeing any insects near her hand. She denies any treatment to the area.  States she "felt a twinge" when she looked at her hands she noticed that it was red and swollen.   Past Medical History  Diagnosis Date  . Hypertension   . GERD (gastroesophageal reflux disease)   . Allergic rhinitis   . Asthma   . Obesity   . Depression   . History of colonic polyps   . DJD (degenerative joint disease), cervical     Past Surgical History  Procedure Date  . Nasal sinus surgery 07/2006    Dr. Annalee Mason  . Nasal polyp surgery 07/2006    x 2 with Dr. Annalee Mason  . Foot surgery 1998  . Tubal ligation     No family history on file.  History  Substance Use Topics  . Smoking status: Never Smoker   . Smokeless tobacco: Not on file  . Alcohol Use: No    OB History    Grav Para Term Preterm Abortions TAB SAB Ect Mult Living                  Review of Systems  Musculoskeletal: Negative for joint swelling.       Hand pain  Skin: Positive for color change and rash. Negative for wound.  Neurological: Negative for numbness.  All  other systems reviewed and are negative.    Allergies  Ivp dye and Promethazine-codeine  Home Medications   Current Outpatient Rx  Name  Route  Sig  Dispense  Refill  . ALBUTEROL SULFATE HFA 108 (90 BASE) MCG/ACT IN AERS   Inhalation   Inhale 2 puffs into the lungs every 6 (six) hours as needed. Shortness of breath         . BECLOMETHASONE DIPROPIONATE 80 MCG/ACT IN AERS   Inhalation   Inhale 1 puff into the lungs 2 (two) times daily.          Marland Kitchen CETIRIZINE HCL 10 MG PO TABS   Oral   Take 10 mg by mouth daily as needed. For allergies         . DICLOFENAC SODIUM 75 MG PO TBEC   Oral   Take 1 tablet (75 mg total) by mouth 2 (two) times daily.   30 tablet   0   . DIPHENHYDRAMINE HCL 25 MG PO TABS   Oral   Take 1 tablet (25 mg total) by mouth every 6 (six) hours as needed for itching (Rash).   30 tablet   0   . FLUTICASONE PROPIONATE 50 MCG/ACT NA SUSP   Nasal   Place 1  spray into the nose daily.          . GUAIFENESIN-CODEINE 100-10 MG/5ML PO SYRP   Oral   Take 5 mLs by mouth 3 (three) times daily as needed for cough.   120 mL   0   . NON FORMULARY      Patient received allergy shots once per week.          Marland Kitchen OMEPRAZOLE 20 MG PO CPDR   Oral   Take 20 mg by mouth every morning.           Marland Kitchen ONDANSETRON 4 MG PO TBDP   Oral   Take 1 tablet (4 mg total) by mouth every 8 (eight) hours as needed for nausea.   20 tablet   0   . POTASSIUM CHLORIDE ER 10 MEQ PO TBCR   Oral   Take 1 tablet (10 mEq total) by mouth 2 (two) times daily.   90 tablet   3   . SULFAMETHOXAZOLE-TRIMETHOPRIM 800-160 MG PO TABS   Oral   Take 1 tablet by mouth every 12 (twelve) hours.   20 tablet   0   . ZAFIRLUKAST 10 MG PO TABS   Oral   Take 1 tablet (10 mg total) by mouth 2 (two) times daily.   180 tablet   3     BP 154/90  Pulse 83  Temp 98.1 F (36.7 C) (Oral)  Resp 16  SpO2 98%  Physical Exam  Nursing note and vitals reviewed. Constitutional: She appears  well-developed and well-nourished. No distress.  HENT:  Head: Normocephalic and atraumatic.  Mouth/Throat: Oropharynx is clear and moist. No oropharyngeal exudate.  Eyes: Conjunctivae normal are normal. No scleral icterus.  Neck: Normal range of motion. Neck supple.  Cardiovascular: Normal rate, regular rhythm, normal heart sounds and intact distal pulses.  Exam reveals no gallop and no friction rub.   No murmur heard.      Capillary refill < 3 sec  Pulmonary/Chest: Effort normal and breath sounds normal. No respiratory distress. She has no wheezes.  Abdominal: Soft. Bowel sounds are normal. She exhibits no mass. There is no tenderness. There is no rebound and no guarding.  Musculoskeletal: Normal range of motion. She exhibits no edema.       Right hand: She exhibits tenderness and swelling. She exhibits normal range of motion, no bony tenderness, normal two-point discrimination, normal capillary refill, no deformity and no laceration. normal sensation noted. Normal strength noted.       Tenderness to palpation of the dorsum of the right hand  Neurological: She is alert.       Speech is clear and goal oriented Moves extremities without ataxia 5/5 strength in the fingers of the R hand with strong and equal grip strength.    Skin: Skin is warm and dry. She is not diaphoretic.       There is erythema, swelling, and some ecchymosis covering the dorsum of her hand.  Psychiatric: She has a normal mood and affect.    ED Course  Procedures (including critical care time)  DIAGNOSTIC STUDIES: Oxygen Saturation is 98% on room air, normal by my interpretation.    COORDINATION OF CARE:  00:03- Discussed planned course of treatment with the patient, including who is agreeable at this time.   Labs Reviewed - No data to display No results found.   1. Erythema of hand       MDM  Andrea Mason presents with sudden onset swelling and  pain of the right hand. Concern for possible insect bite  vs infection.  Redness, swelling and pain are concerning for possible early cellulitis. We'll treat as allergic reaction for the evening and write patient antibiotic to fill it not improved in the morning. Also recommended that she followup with her primary care physician on Monday for further evaluation.  I have also discussed reasons to return immediately to the ER.  Patient expresses understanding and agrees with plan.  Dr. Eber Hong was consulted, evaluated this patient with me and agrees with the plan.     1. Medications: bactrim if no better by tomorrow morning, benadryl tonight and tomorrow, usual home medications  2. Treatment: rest, drink plenty of fluids, use warm compress, ibuprofen or tylenol for pain  3. Follow Up: Please followup with your primary doctor for discussion of your diagnoses and further evaluation after today's visit;  I personally performed the services described in this documentation, which was scribed in my presence. The recorded information has been reviewed and is accurate.          Dahlia Client Stephon Weathers, PA-C 01/05/13 [phone]

## 2013-01-05 ENCOUNTER — Encounter (HOSPITAL_COMMUNITY): Payer: Self-pay

## 2013-01-05 MED ORDER — DIPHENHYDRAMINE HCL 25 MG PO TABS: 25.0000 mg | ORAL_TABLET | Freq: Four times a day (QID) | ORAL | Status: DC | PRN

## 2013-01-05 MED ORDER — SULFAMETHOXAZOLE-TRIMETHOPRIM 800-160 MG PO TABS: 1.0000 | ORAL_TABLET | Freq: Two times a day (BID) | ORAL | Status: DC

## 2013-01-06 NOTE — ED Provider Notes (Signed)
Acute onset of pain and redness to the dorsum of the right hand prior to arrival.  On exam the patient has a mildly tender, mildly increased warm, red dorsum of the right hand with minimal swelling. Normal cap refill, normal sensation.  Patient has possible insect bite versus early infection, will be given antibiotics to take tomorrow if no better with conservative therapy this evening. No fever, nontoxic, patient has expressed understanding.   Medical screening examination/treatment/procedure(s) were conducted as a shared visit with non-physician practitioner(s) and myself.  I personally evaluated the patient during the encounter    Vida Roller, MD 01/06/13 [phone]

## 2013-02-04 ENCOUNTER — Telehealth: Payer: Self-pay | Admitting: Internal Medicine

## 2013-02-04 NOTE — Telephone Encounter (Signed)
Pt is c/o wheezing, SOB, cough, sore throat, laryngitis all x 4 days. Pt last seen by CY on 03-2011 for allergies and seen by wert in the past for pulmonary. Per TD schedule the pt with MW tomorrow PM. Appt set for tomorrow at 2pm.  Carron Curie, CMA

## 2013-02-05 ENCOUNTER — Ambulatory Visit: Payer: Self-pay | Admitting: Internal Medicine

## 2013-02-05 ENCOUNTER — Encounter: Payer: Self-pay | Admitting: Internal Medicine

## 2013-02-05 ENCOUNTER — Ambulatory Visit (INDEPENDENT_AMBULATORY_CARE_PROVIDER_SITE_OTHER): Payer: Self-pay | Admitting: Internal Medicine

## 2013-02-05 VITALS — BP 128/86 | HR 72 | Temp 97.9°F | Ht 61.0 in | Wt 213.0 lb

## 2013-02-05 DIAGNOSIS — J45909 Unspecified asthma, uncomplicated: Secondary | ICD-10-CM

## 2013-02-05 DIAGNOSIS — R05 Cough: Secondary | ICD-10-CM

## 2013-02-05 MED ORDER — PREDNISONE (PAK) 10 MG PO TABS: ORAL_TABLET | ORAL | Status: DC

## 2013-02-05 MED ORDER — AMOXICILLIN-POT CLAVULANATE 875-125 MG PO TABS: 1.0000 | ORAL_TABLET | Freq: Two times a day (BID) | ORAL | Status: DC

## 2013-02-05 NOTE — Patient Instructions (Addendum)
Work on inhaler technique:  relax and gently blow all the way out then take a nice smooth deep breath back in, triggering the inhaler at same time you start breathing in.  Hold for up to 5 seconds if you can.  Rinse and gargle with water when done   If your mouth or throat starts to bother you,   I suggest you time the inhaler to your dental care and after using the inhaler(s) brush teeth and tongue with a baking soda containing toothpaste and when you rinse this out, gargle with it first to see if this helps your mouth and throat.     Augmentin 875 twice daily with glass of water and yogurt lunch  Prednisone 10 mg take  4 each am x 2 days,   2 each am x 2 days,  1 each am x2days and stop

## 2013-02-05 NOTE — Progress Notes (Signed)
Subjective:     Patient ID: Andrea Mason, female   DOB: [birth year]  MRN: [id number]  HPI  5 yobf never smoker followed by Dr Maple Hudson for allergic rhinitis and asthma   02/05/2013 1st pulmonary eval in [facility] era baseline = completely better on qvar 80 2bid and only use rescue once a week at baseline then much worse starting 3/3 with cough/ congestion/ green mucus and rescue x one by time of ov at 2pm.  No resting sob, very hoarse with harsh barking cough  No obvious daytime variabilty or assoc  p or chest tightness, subjective wheeze overt sinus or hb symptoms. No unusual exp hx    Review of Systems     Objective:   Physical Exam Obese hoarse wf nad Wt Readings from Last 3 Encounters:  02/05/13 213 lb (96.616 kg)  12/21/12 205 lb (92.987 kg)  11/09/12 210 lb (95.255 kg)    HEENT: nl dentition,  and orophanx. Nl external ear canals without cough reflex. Bilateral nasal obst with crusted green mucus.   NECK :  without JVD/Nodes/TM/ nl carotid upstrokes bilaterally   LUNGS: no acc muscle use, clear to A and P bilaterally without cough on insp or exp maneuvers   CV:  RRR  no s3 or murmur or increase in P2, no edema   ABD:  soft and nontender with nl excursion in the supine position. No bruits or organomegaly, bowel sounds nl  MS:  warm without deformities, calf tenderness, cyanosis or clubbing  SKIN: warm and dry without lesions    NEURO:  alert, approp, no deficits      Assessment:           Plan:

## 2013-02-06 DIAGNOSIS — R05 Cough: Secondary | ICD-10-CM | POA: Insufficient documentation

## 2013-02-06 NOTE — Assessment & Plan Note (Signed)
Acute and probabably related to poor control of rhinitis with ? Sinusitis > try augmentin and prednisone then regroup if not better

## 2013-02-06 NOTE — Assessment & Plan Note (Signed)
The proper method of use, as well as anticipated side effects, of a metered-dose inhaler are discussed and demonstrated to the patient. Improved effectiveness after extensive coaching during this visit to a level of approximately  75%  She was well controlled on qvar prior to present exac so no change in maint rx

## 2013-02-14 ENCOUNTER — Telehealth: Payer: Self-pay | Admitting: Internal Medicine

## 2013-02-14 NOTE — Telephone Encounter (Signed)
Spoke with patient, last seen 02/05/13. Patient reports she completed pred taper and antibiotic (Augmentin) and feels they really helped,however patient states she now has a "choking cough" x several days that she can not rid. Patient says she has done the Afrin followed by the Atrovent as suggested along with taking Guaia-Tussin cough syrup- which patient doesn't want to continue with because the fear of getting addicted. Also has tried [employer] she doesn't want to stay with either due to it making her feel "out of it." Patient states she uses her flutter valve which doesn't help either. I rec patient try delsym, stated she would try this but also wanted more recs from Dr. Sherene Sires. Dr. Sherene Sires please advise, thank you  Last OV: 02/05/13 no scheduled follow up at this time   Allergies  Allergen Reactions  . Ivp Dye (Iodinated Diagnostic Agents) Nausea And Vomiting    Reaction: hot flashes  . Promethazine-Codeine Nausea And Vomiting   Pharmacy: [facility] [address]

## 2013-02-14 NOTE — Telephone Encounter (Signed)
Nothing else to offer over the phone  Strongly rec  See [name]  ASAP with all your medications, even over the counter meds, separated in two separate bags, the ones you take no matter what vs the ones you stop once you feel better and take only as needed when you feel you need them.   [name]  will generate for you a new user friendly medication calendar that will put [place] all on the same page re: your medication use.      Andrea Mason

## 2013-02-14 NOTE — Telephone Encounter (Signed)
Pt is aware of MW recs. She scheduled acute visit with WM since TP had no openings on Monday. Nothing further was needed

## 2013-02-15 ENCOUNTER — Emergency Department (HOSPITAL_COMMUNITY)
Admission: EM | Admit: 2013-02-15 | Discharge: 2013-02-15 | Disposition: A | Discharge status: Home / Self Care | Payer: Self-pay | Attending: Emergency Medicine | Admitting: Emergency Medicine

## 2013-02-15 ENCOUNTER — Encounter (HOSPITAL_COMMUNITY): Payer: Self-pay | Admitting: Emergency Medicine

## 2013-02-15 DIAGNOSIS — R55 Syncope and collapse: Secondary | ICD-10-CM | POA: Insufficient documentation

## 2013-02-15 DIAGNOSIS — K219 Gastro-esophageal reflux disease without esophagitis: Secondary | ICD-10-CM | POA: Insufficient documentation

## 2013-02-15 DIAGNOSIS — R05 Cough: Secondary | ICD-10-CM | POA: Insufficient documentation

## 2013-02-15 DIAGNOSIS — E669 Obesity, unspecified: Secondary | ICD-10-CM | POA: Insufficient documentation

## 2013-02-15 DIAGNOSIS — Z79899 Other long term (current) drug therapy: Secondary | ICD-10-CM | POA: Insufficient documentation

## 2013-02-15 DIAGNOSIS — R059 Cough, unspecified: Secondary | ICD-10-CM | POA: Insufficient documentation

## 2013-02-15 DIAGNOSIS — IMO0002 Reserved for concepts with insufficient information to code with codable children: Secondary | ICD-10-CM | POA: Insufficient documentation

## 2013-02-15 DIAGNOSIS — J45909 Unspecified asthma, uncomplicated: Secondary | ICD-10-CM | POA: Insufficient documentation

## 2013-02-15 DIAGNOSIS — R5381 Other malaise: Secondary | ICD-10-CM | POA: Insufficient documentation

## 2013-02-15 DIAGNOSIS — Z8659 Personal history of other mental and behavioral disorders: Secondary | ICD-10-CM | POA: Insufficient documentation

## 2013-02-15 DIAGNOSIS — Z860100 Personal history of colon polyps, unspecified: Secondary | ICD-10-CM | POA: Insufficient documentation

## 2013-02-15 DIAGNOSIS — Z8739 Personal history of other diseases of the musculoskeletal system and connective tissue: Secondary | ICD-10-CM | POA: Insufficient documentation

## 2013-02-15 DIAGNOSIS — I10 Essential (primary) hypertension: Secondary | ICD-10-CM | POA: Insufficient documentation

## 2013-02-15 LAB — BASIC METABOLIC PANEL
CO2: 33 mEq/L — ABNORMAL HIGH (ref 19–32)
Glucose, Bld: 98 mg/dL (ref 70–99)
Potassium: 2.8 mEq/L — ABNORMAL LOW (ref 3.5–5.1)
Sodium: 138 mEq/L (ref 135–145)

## 2013-02-15 LAB — CBC WITH DIFFERENTIAL/PLATELET
Eosinophils Absolute: 0.7 10*3/uL (ref 0.0–0.7)
Eosinophils Relative: 11 % — ABNORMAL HIGH (ref 0–5)
Hemoglobin: 12.9 g/dL (ref 12.0–15.0)
Lymphocytes Relative: 28 % (ref 12–46)
Lymphs Abs: 1.8 10*3/uL (ref 0.7–4.0)
MCH: 29.4 pg (ref 26.0–34.0)
MCV: 87.5 fL (ref 78.0–100.0)
Monocytes Relative: 9 % (ref 3–12)
Neutrophils Relative %: 52 % (ref 43–77)
RBC: 4.39 MIL/uL (ref 3.87–5.11)

## 2013-02-15 MED ORDER — POTASSIUM CHLORIDE CRYS ER 20 MEQ PO TBCR
40.0000 meq | EXTENDED_RELEASE_TABLET | Freq: Once | ORAL | Status: AC
  Administered 2013-02-15: 40 meq via ORAL
  Filled 2013-02-15: qty 2

## 2013-02-15 MED ORDER — POTASSIUM CHLORIDE CRYS ER 20 MEQ PO TBCR: 20.0000 meq | EXTENDED_RELEASE_TABLET | Freq: Two times a day (BID) | ORAL | Status: DC

## 2013-02-15 NOTE — ED Provider Notes (Signed)
History     CSN: [id number]  Arrival date & time 02/15/13  1904   First MD Initiated Contact with Patient 02/15/13 1935      Chief Complaint  Patient presents with  . Near Syncope    (Consider location/radiation/quality/duration/timing/severity/associated sxs/prior treatment) HPI Complains of generalized weakness causing her to go her knees while standing talking to her friend for a few minutes this afternoon 1:30 PM episode lasted 5 minutes, resolve spontaneously. He denies any chest pain shortness of breath headache or abdominal pain. She is presently asymptomatic. Symptoms resolved spontaneously after 5 minutes .  Past Medical History  Diagnosis Date  . Hypertension   . GERD (gastroesophageal reflux disease)   . Allergic rhinitis   . Asthma   . Obesity   . Depression   . History of colonic polyps   . DJD (degenerative joint disease), cervical     Past Surgical History  Procedure Laterality Date  . Nasal sinus surgery  07/2006    Dr. Annalee Genta  . Nasal polyp surgery  07/2006    x 2 with Dr. Annalee Genta  . Foot surgery  1998  . Tubal ligation      History reviewed. No pertinent family history.  History  Substance Use Topics  . Smoking status: Never Smoker   . Smokeless tobacco: Never Used  . Alcohol Use: No    OB History   Grav Para Term Preterm Abortions TAB SAB Ect Mult Living                  Review of Systems  HENT: Negative.   Respiratory: Positive for cough.        Patient treated for cough with Augmentin, and prednisone. Cough has resolved today.  Cardiovascular: Negative.   Gastrointestinal: Negative.   Musculoskeletal: Negative.   Skin: Negative.   Neurological: Positive for weakness.       Near syncope  Psychiatric/Behavioral: Negative.   All other systems reviewed and are negative.    Allergies  Ivp dye and Promethazine-codeine  Home Medications   Current Outpatient Rx  Name  Route  Sig  Dispense  Refill  . albuterol (PROAIR HFA)  108 (90 BASE) MCG/ACT inhaler   Inhalation   Inhale 2 puffs into the lungs every 6 (six) hours as needed. Shortness of breath         . amoxicillin-clavulanate (AUGMENTIN) 875-125 MG per tablet   Oral   Take 1 tablet by mouth 2 (two) times daily.   20 tablet   0   . beclomethasone (QVAR) 80 MCG/ACT inhaler   Inhalation   Inhale 1 puff into the lungs 2 (two) times daily.          . cetirizine (ZYRTEC) 10 MG tablet   Oral   Take 10 mg by mouth daily as needed. For allergies         . diphenhydrAMINE (BENADRYL) 25 MG tablet   Oral   Take 1 tablet (25 mg total) by mouth every 6 (six) hours as needed for itching (Rash).   30 tablet   0   . fluticasone (FLONASE) 50 MCG/ACT nasal spray   Nasal   Place 1 spray into the nose daily.          Marland Kitchen guaiFENesin-codeine (ROBITUSSIN AC) 100-10 MG/5ML syrup   Oral   Take 5 mLs by mouth 3 (three) times daily as needed for cough.   120 mL   0   . NON FORMULARY  Patient received allergy shots once per week.          Marland Kitchen omeprazole (PRILOSEC) 20 MG capsule   Oral   Take 20 mg by mouth every morning.           . potassium chloride (KLOR-CON 10) 10 MEQ tablet   Oral   Take 1 tablet (10 mEq total) by mouth 2 (two) times daily.   90 tablet   3   . predniSONE (STERAPRED UNI-PAK) 10 MG tablet      Prednisone 10 mg take  4 each am x 2 days,   2 each am x 2 days,  1 each am x2days and stop   14 tablet   0     BP 140/92  Temp(Src) 97.9 F (36.6 C) (Oral)  Resp 25  SpO2 100%  Physical Exam  Nursing note and vitals reviewed. Constitutional: She appears well-developed and well-nourished.  HENT:  Head: Normocephalic and atraumatic.  Eyes: Conjunctivae are normal. Pupils are equal, round, and reactive to light.  Neck: Neck supple. No tracheal deviation present. No thyromegaly present.  Cardiovascular: Normal rate and regular rhythm.   No murmur heard. Pulmonary/Chest: Effort normal and breath sounds normal.   Abdominal: Soft. Bowel sounds are normal. She exhibits no distension. There is no tenderness.  Obese  Musculoskeletal: Normal range of motion. She exhibits no edema and no tenderness.  Neurological: She is alert. No cranial nerve deficit. Coordination normal.  Gait normal not lightheaded on standing  Skin: Skin is warm and dry. No rash noted.  Psychiatric: She has a normal mood and affect.    ED Course  Procedures (including critical care time)  Labs Reviewed  BASIC METABOLIC PANEL  CBC WITH DIFFERENTIAL  MAGNESIUM   No results found.   No diagnosis found.  Date: 02/15/2013  Rate: 95  Rhythm: normal sinus rhythm  QRS Axis: normal  Intervals: normal  ST/T Wave abnormalities: normal  Conduction Disutrbances: none  Narrative Interpretation: unremarkable Results for orders placed during the hospital encounter of 02/15/13  BASIC METABOLIC PANEL      Result Value Range   Sodium 138  135 - 145 mEq/L   Potassium 2.8 (*) 3.5 - 5.1 mEq/L   Chloride 98  96 - 112 mEq/L   CO2 33 (*) 19 - 32 mEq/L   Glucose, Bld 98  70 - 99 mg/dL   BUN 7  6 - 23 mg/dL   Creatinine, Ser 1.61  0.50 - 1.10 mg/dL   Calcium 9.0  8.4 - 09.6 mg/dL   GFR calc non Af Amer 89 (*) >90 mL/min   GFR calc Af Amer >90  >90 mL/min  CBC WITH DIFFERENTIAL      Result Value Range   WBC 6.5  4.0 - 10.5 K/uL   RBC 4.39  3.87 - 5.11 MIL/uL   Hemoglobin 12.9  12.0 - 15.0 g/dL   HCT 04.5  40.9 - 81.1 %   MCV 87.5  78.0 - 100.0 fL   MCH 29.4  26.0 - 34.0 pg   MCHC 33.6  30.0 - 36.0 g/dL   RDW 91.4  78.2 - 95.6 %   Platelets 227  150 - 400 K/uL   Neutrophils Relative 52  43 - 77 %   Neutro Abs 3.4  1.7 - 7.7 K/uL   Lymphocytes Relative 28  12 - 46 %   Lymphs Abs 1.8  0.7 - 4.0 K/uL   Monocytes Relative 9  3 - 12 %  Monocytes Absolute 0.6  0.1 - 1.0 K/uL   Eosinophils Relative 11 (*) 0 - 5 %   Eosinophils Absolute 0.7  0.0 - 0.7 K/uL   Basophils Relative 1  0 - 1 %   Basophils Absolute 0.0  0.0 - 0.1 K/uL   MAGNESIUM      Result Value Range   Magnesium 2.0  1.5 - 2.5 mg/dL   No results found.  Unchanged from 03/13/2009 interpreted by me  Patient remained in normal sinus rhythm without arrhythmia throughout entire emergency department stay. He remained asymptomatic. At 11:35 PM she feels ready to go home  MDM  Plan prescription [facility] followup Dr. Jonny Ruiz this week Diagnosis #1 near syncope #2 hypokalemia        Doug Sou, MD 02/15/13 2339

## 2013-02-15 NOTE — ED Notes (Signed)
Pt states that she was at a store when she began to feel lightheaded with nausea and cold sweat. Pt sat down and after a few minutes felt slightly better. Pt went to her car and ate some cookies, fruit, and pretzels and felt better.  Pt states she only had a banana this morning. She also taking a codeine cough syrup and Muscinex for severe allergies. Pt A&O x 4, in NAD, skin pwd.

## 2013-02-17 ENCOUNTER — Ambulatory Visit: Payer: Self-pay | Admitting: Internal Medicine

## 2013-03-05 ENCOUNTER — Telehealth: Payer: Self-pay | Admitting: Internal Medicine

## 2013-03-05 MED ORDER — AMOXICILLIN-POT CLAVULANATE 875-125 MG PO TABS: 1.0000 | ORAL_TABLET | Freq: Two times a day (BID) | ORAL | Status: DC

## 2013-03-05 NOTE — Telephone Encounter (Signed)
augmentin x 10 more days bid then ov with all meds in hand to see me or [name]

## 2013-03-05 NOTE — Telephone Encounter (Signed)
Spoke with patient, patient c/o sinus infection x 3 days. States she is blowing out green mucus with some blood. States mucus is getting "crusty" in nose and sometimes feels this is "blocking her up." Patient states she has some chills and wheezing, but denies any SOB or fever. Patient has not taken any OTC meds only using nasal spray and saline spray. Requesting Dr. Sherene Sires call her in something for symptoms Patient last seen 02/05/13 w same symptoms and was given pred taper and augmentin 875 Dr. Sherene Sires please advise, thank you!  [facility]-- [address]  Last OV: 02/05/13- no follow up scheduled  Allergies  Allergen Reactions  . Ivp Dye (Iodinated Diagnostic Agents) Nausea And Vomiting    Reaction: hot flashes  . Promethazine-Codeine Nausea And Vomiting

## 2013-03-05 NOTE — Telephone Encounter (Signed)
I spoke with pt and is aware of MW recs. RX has been sent and pt scheduled appt. Nothing further was needed

## 2013-03-17 ENCOUNTER — Encounter: Payer: Self-pay | Admitting: Adult Health

## 2013-03-17 ENCOUNTER — Ambulatory Visit (INDEPENDENT_AMBULATORY_CARE_PROVIDER_SITE_OTHER): Payer: Self-pay | Admitting: Adult Health

## 2013-03-17 VITALS — BP 118/64 | HR 88 | Temp 98.6°F | Ht 61.0 in | Wt 213.0 lb

## 2013-03-17 DIAGNOSIS — J45909 Unspecified asthma, uncomplicated: Secondary | ICD-10-CM

## 2013-03-17 NOTE — Progress Notes (Signed)
Subjective:     Patient ID: Andrea Mason, female   DOB: 06-13-1951  MRN: [id number] HPI  89 yobf never smoker followed by Dr Maple Hudson for allergic rhinitis and asthma   02/05/2013 1st pulmonary eval in EPIC era baseline = completely better on qvar 80 2bid and only use rescue once a week at baseline then much worse starting 3/3 with cough/ congestion/ green mucus and rescue x one by time of ov at 2pm.  No resting sob, very hoarse with harsh barking cough >Augmentin rx   03/17/2013 Follow up and med calendar  Pt returns for 6 week followup and medication review. We reviewed all her medications and organized them into a medication calendar with patient education. Patient was recently seen for a asthmatic flare with sinusitis. She was given Augmentin and a steroid taper. Patient reports she did improve however, continued to have, nasal congestion and cough with thick green mucus. She was called in  Augmentin for an additional 10 days last week on 4/2 She has few days left . She is feeling better. She feels sinus infection is greatly improved after 2 rounds of augmentin (still taking) and pred taper She denies any hemoptysis, orthopnea, PND, or leg swelling  Review of Systems Constitutional:   No  weight loss, night sweats,  Fevers, chills, fatigue, or  lassitude.  HEENT:   No headaches,  Difficulty swallowing,  Tooth/dental problems, or  Sore throat,                No sneezing, itching, ear ache,  +Nasal congestion, post nasal drip,   CV:  No chest pain,  Orthopnea, PND, swelling in lower extremities, anasarca, dizziness, palpitations, syncope.   GI  No heartburn, indigestion, abdominal pain, nausea, vomiting, diarrhea, change in bowel habits, loss of appetite, bloody stools.   Resp:    No coughing up of blood.  No change in color of mucus.  No wheezing.  No chest wall deformity  Skin: no rash or lesions.  GU: no dysuria, change in color of urine, no urgency or frequency.  No flank pain, no  hematuria   MS:  No joint pain or swelling.  No decreased range of motion.  No back pain.  Psych:  No change in mood or affect. No depression or anxiety.  No memory loss.          Objective:   Physical Exam Obese AAF    HEENT: nl dentition,  and orophanx. Nl external ear canals without cough reflex. Pale nasal mucosa   NECK :  without JVD/Nodes/TM/ nl carotid upstrokes bilaterally   LUNGS: no acc muscle use, clear to A and P bilaterally without cough on insp or exp maneuvers   CV:  RRR  no s3 or murmur or increase in P2, no edema   ABD:  soft and nontender with nl excursion in the supine position. No bruits or organomegaly, bowel sounds nl  MS:  warm without deformities, calf tenderness, cyanosis or clubbing  SKIN: warm and dry without lesions    NEURO:  alert, approp, no deficits      Assessment:           Plan:

## 2013-03-17 NOTE — Patient Instructions (Addendum)
Finish Augmentin as directed.  Follow med calendar closely and bring to each visit.  Follow up Dr. Sherene Sires  In 2-3 months and As needed   Please contact office for sooner follow up if symptoms do not improve or worsen or seek emergency care

## 2013-03-17 NOTE — Assessment & Plan Note (Signed)
Recent flare with associated sinusitis -now resolving.   Patient's medications were reviewed today and patient education was given. Computerized medication calendar was adjusted/completed  Plan  Finish Augmentin as directed.  Follow med calendar closely and bring to each visit.  Follow up Dr. Sherene Sires  In 2-3 months and As needed   Please contact office for sooner follow up if symptoms do not improve or worsen or seek emergency care

## 2013-03-31 NOTE — Addendum Note (Signed)
Addended by: Boone Master E on: 03/31/2013 09:32 AM   Modules accepted: Orders

## 2013-04-18 ENCOUNTER — Encounter: Payer: Self-pay | Admitting: Internal Medicine

## 2013-04-18 ENCOUNTER — Ambulatory Visit (INDEPENDENT_AMBULATORY_CARE_PROVIDER_SITE_OTHER): Payer: BC Managed Care – PPO | Admitting: Internal Medicine

## 2013-04-18 VITALS — BP 130/80 | HR 86 | Temp 97.6°F | Ht 61.25 in | Wt 211.6 lb

## 2013-04-18 DIAGNOSIS — J45909 Unspecified asthma, uncomplicated: Secondary | ICD-10-CM

## 2013-04-18 MED ORDER — ALBUTEROL SULFATE HFA 108 (90 BASE) MCG/ACT IN AERS: 2.0000 | INHALATION_SPRAY | Freq: Four times a day (QID) | RESPIRATORY_TRACT | Status: DC | PRN

## 2013-04-18 MED ORDER — PREDNISONE (PAK) 10 MG PO TABS: ORAL_TABLET | ORAL | Status: DC

## 2013-04-18 NOTE — Progress Notes (Signed)
Subjective:     Patient ID: Andrea Mason, female   DOB: [birth year]  MRN: [id number] HPI  61 yobf never smoker followed by Dr Maple Hudson for allergic rhinitis and asthma   02/05/2013 1st pulmonary eval in EPIC era baseline = completely better on qvar 80 2bid and only use rescue once a week at baseline then much worse starting 3/3 with cough/ congestion/ green mucus and rescue x one by time of ov at 2pm.  No resting sob, very hoarse with harsh barking cough >Augmentin rx   03/17/2013 Follow up and med calendar   no change rx, use med calendar   04/18/2013  ov/[facility] Shimmel acute w/in no calendar Chief Complaint  Patient presents with  . Acute Visit    Pt c/o wheezing for the past several wks, worse today after being exposed to fragrences. She also c/o cough since last night, non prod.    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  No obvious daytime variabilty or assoc chronic cough or cp or chest tightness, subjective wheeze overt sinus or hb symptoms. No unusual exp hx or h/o childhood pna/ asthma or premature birth to her knowledge.   Sleeping ok without nocturnal  or early am exacerbation  of respiratory  c/o's or need for noct saba. Also denies any obvious fluctuation of symptoms with weather or environmental changes or other aggravating or alleviating factors except as outlined above   Current Medications, Allergies, Past Medical History, Past Surgical History, Family History, and Social History were reviewed in [employer] record.  ROS  The following are not active complaints unless bolded sore throat, dysphagia, dental problems, itching, sneezing,  nasal congestion or excess/ purulent secretions, ear ache,   fever, chills, sweats, unintended wt loss, pleuritic or exertional cp, hemoptysis,  orthopnea pnd or leg swelling, presyncope, palpitations, heartburn, abdominal pain, anorexia, nausea, vomiting, diarrhea  or change in bowel  or urinary habits, change in stools or urine, dysuria,hematuria,  rash, arthralgias, visual complaints, headache, numbness weakness or ataxia or problems with walking or coordination,  change in mood/affect or memory.                  Objective:   Physical Exam Obese AAF mod hoarse nad p saba Wt Readings from Last 3 Encounters:  04/18/13 211 lb 9.6 oz (95.981 kg)  03/17/13 213 lb (96.616 kg)  02/05/13 213 lb (96.616 kg)      HEENT: nl dentition,  and orophanx. Nl external ear canals without cough reflex. Pale nasal mucosa   NECK :  without JVD/Nodes/TM/ nl carotid upstrokes bilaterally   LUNGS: no acc muscle use, clear to A and P bilaterally without cough on insp or exp maneuvers   CV:  RRR  no s3 or murmur or increase in P2, no edema   ABD:  soft and nontender with nl excursion in the supine position. No bruits or organomegaly, bowel sounds nl  MS:  warm without deformities, calf tenderness, cyanosis or clubbing  SKIN: warm and dry without lesions           Assessment:           Plan:

## 2013-04-18 NOTE — Patient Instructions (Addendum)
Prednisone 10 mg take  4 each am x 2 days,   2 each am x 2 days,  1 each am x2days and stop  Only use your albuterol as a rescue medication to be used if you can't catch your breath by resting or doing a relaxed purse lip breathing pattern. The less you use it, the better it will work when you need it. Ok to use up to every 4 hours  Please schedule a follow up office visit in 6 weeks, call sooner if needed

## 2013-04-19 NOTE — Assessment & Plan Note (Addendum)
DDX of  difficult airways managment all start with A and  include Adherence, Ace Inhibitors, Acid Reflux, Active Sinus Disease, Alpha 1 Antitripsin deficiency, Anxiety masquerading as Airways dz,  ABPA,  allergy(esp in young), Aspiration (esp in elderly), Adverse effects of DPI,  Active smokers, plus two Bs  = Bronchiectasis and Beta blocker use..and one C= CHF   In this case Adherence is the biggest issue and starts with  inability to use HFA effectively and also  understand that SABA treats the symptoms but doesn't get to the underlying problem (inflammation).  I used  the analogy of putting steroid cream on a rash to help explain the meaning of topical therapy and the need to get the drug to the target tissue.  She simply cannot stop the ics and expect to be able to just treat the symptoms with saba s risk of asthma death, reviewed as well as ?" Alternative/homeopathic"rx addressed in one word > NO  ? Active sinus dz > doubt presently though an issue in past  ? Acid reflu x> reviewed rx    Each maintenance medication was reviewed in detail including most importantly the difference between maintenance and as needed and under what circumstances the prns are to be used.  Please see instructions for details which were reviewed in writing and the patient given a copy.

## 2013-04-21 ENCOUNTER — Encounter: Payer: Self-pay | Admitting: Internal Medicine

## 2013-04-21 ENCOUNTER — Telehealth: Payer: Self-pay | Admitting: Internal Medicine

## 2013-04-21 NOTE — Telephone Encounter (Signed)
Pt last seen on 04-18-13 for asthma. Pt states over the weekend she has been having increased productive cough with clear phelgm, hoarseness, and wheezing.  According to pt instruction an rx for prednisone was sent to the pharmacy, pt states she was not aware of this and has not taken any. I advised the pt to go pick up prednisone and take as directed and if symptoms continue to worsen to let us know. Carron Curie, CMA

## 2013-05-06 ENCOUNTER — Encounter: Payer: Self-pay | Admitting: Internal Medicine

## 2013-05-06 ENCOUNTER — Ambulatory Visit (INDEPENDENT_AMBULATORY_CARE_PROVIDER_SITE_OTHER): Payer: BC Managed Care – PPO | Admitting: Internal Medicine

## 2013-05-06 ENCOUNTER — Other Ambulatory Visit (INDEPENDENT_AMBULATORY_CARE_PROVIDER_SITE_OTHER): Payer: BC Managed Care – PPO

## 2013-05-06 VITALS — BP 108/80 | HR 84 | Temp 97.4°F | Ht 61.5 in | Wt 211.5 lb

## 2013-05-06 DIAGNOSIS — R7309 Other abnormal glucose: Secondary | ICD-10-CM

## 2013-05-06 DIAGNOSIS — Z0000 Encounter for general adult medical examination without abnormal findings: Secondary | ICD-10-CM

## 2013-05-06 DIAGNOSIS — Z23 Encounter for immunization: Secondary | ICD-10-CM

## 2013-05-06 DIAGNOSIS — Z800 Family history of malignant neoplasm of digestive organs: Secondary | ICD-10-CM

## 2013-05-06 DIAGNOSIS — Z2911 Encounter for prophylactic immunotherapy for respiratory syncytial virus (RSV): Secondary | ICD-10-CM

## 2013-05-06 DIAGNOSIS — E876 Hypokalemia: Secondary | ICD-10-CM | POA: Insufficient documentation

## 2013-05-06 LAB — HEPATIC FUNCTION PANEL
AST: 15 U/L (ref 0–37)
Alkaline Phosphatase: 104 U/L (ref 39–117)
Bilirubin, Direct: 0.1 mg/dL (ref 0.0–0.3)
Total Bilirubin: 0.8 mg/dL (ref 0.3–1.2)

## 2013-05-06 LAB — CBC WITH DIFFERENTIAL/PLATELET
Basophils Absolute: 0 10*3/uL (ref 0.0–0.1)
Eosinophils Absolute: 0.8 10*3/uL — ABNORMAL HIGH (ref 0.0–0.7)
Lymphocytes Relative: 26.8 % (ref 12.0–46.0)
MCHC: 32.9 g/dL (ref 30.0–36.0)
MCV: 90.5 fl (ref 78.0–100.0)
Monocytes Absolute: 0.4 10*3/uL (ref 0.1–1.0)
Neutrophils Relative %: 57 % (ref 43.0–77.0)
Platelets: 227 10*3/uL (ref 150.0–400.0)

## 2013-05-06 LAB — HEMOGLOBIN A1C: Hgb A1c MFr Bld: 5.8 % (ref 4.6–6.5)

## 2013-05-06 LAB — LIPID PANEL: HDL: 47.8 mg/dL (ref >39.00)

## 2013-05-06 LAB — BASIC METABOLIC PANEL
GFR: 119.44 mL/min (ref >60.00)
Potassium: 3.9 mEq/L (ref 3.5–5.1)
Sodium: 139 mEq/L (ref 135–145)

## 2013-05-06 LAB — TSH: TSH: 1.07 u[IU]/mL (ref 0.35–5.50)

## 2013-05-06 NOTE — Progress Notes (Signed)
Subjective:    Patient ID: Andrea Mason, female    DOB: June 25, 1951, 62 y.o.   MRN: [id number]  HPI  Here for wellness and f/u;  Overall doing ok;  Pt denies CP, worsening SOB, DOE, wheezing, orthopnea, PND, worsening LE edema, palpitations, dizziness or syncope.  Pt denies neurological change such as new headache, facial or extremity weakness.  Pt denies polydipsia, polyuria, or low sugar symptoms. Pt states overall good compliance with treatment and medications, good tolerability, and has been trying to follow lower cholesterol diet.  Pt denies worsening depressive symptoms, suicidal ideation or panic. No fever, night sweats, wt loss, loss of appetite, or other constitutional symptoms.  Pt states good ability with ADL's, has low fall risk, home safety reviewed and adequate, no other significant changes in hearing or vision, and only occasionally active with exercise.  Brother with hx of colon cancer, due for colonoscopy Past Medical History  Diagnosis Date  . Hypertension   . GERD (gastroesophageal reflux disease)   . Allergic rhinitis   . Asthma   . Obesity   . Depression   . History of colonic polyps   . DJD (degenerative joint disease), cervical    Past Surgical History  Procedure Laterality Date  . Nasal sinus surgery  07/2006    Dr. Annalee Genta  . Nasal polyp surgery  07/2006    x 2 with Dr. Annalee Genta  . Foot surgery  1998  . Tubal ligation      reports that she has never smoked. She has never used smokeless tobacco. She reports that she does not drink alcohol or use illicit drugs. family history is not on file. Allergies  Allergen Reactions  . Ivp Dye (Iodinated Diagnostic Agents) Nausea And Vomiting    Reaction: hot flashes  . Promethazine-Codeine Nausea And Vomiting   Current Outpatient Prescriptions on File Prior to Visit  Medication Sig Dispense Refill  . albuterol (PROAIR HFA) 108 (90 BASE) MCG/ACT inhaler 1-2 puffs every 4-6 hours as needed for shortness of breath      .  albuterol (PROAIR HFA) 108 (90 BASE) MCG/ACT inhaler Inhale 2 puffs into the lungs every 6 (six) hours as needed for wheezing. 2 puffs every 4 hours as needed only  if your can't catch your breath  1 Inhaler  2  . beclomethasone (QVAR) 80 MCG/ACT inhaler Inhale 2 puffs into the lungs 2 (two) times daily.       . Biotin 5000 MCG CAPS Take 1 capsule by mouth daily.      . cetirizine (ZYRTEC) 10 MG tablet Take 10 mg by mouth at bedtime.       . fluticasone (FLONASE) 50 MCG/ACT nasal spray Place 2 sprays into the nose 2 (two) times daily.       Marland Kitchen guaiFENesin (MUCINEX) 600 MG 12 hr tablet 1-2 twice daily as needed      . NON FORMULARY Patient received allergy shots once per week.       Marland Kitchen omeprazole (PRILOSEC) 20 MG capsule Take 20 mg by mouth every morning.        Marland Kitchen oxymetazoline (AFRIN) 0.05 % nasal spray Place 2 sprays into the nose 2 (two) times daily. For 5 days      . potassium chloride SA (K-DUR,KLOR-CON) 20 MEQ tablet Take 1 tablet (20 mEq total) by mouth 2 (two) times daily.  10 tablet  0  . Respiratory Therapy Supplies (FLUTTER) DEVI Use as needed for cough/congestion      . sodium chloride (  OCEAN) 0.65 % nasal spray Place 2 sprays into the nose as needed for congestion.      . traMADol (ULTRAM) 50 MG tablet Take 50 mg by mouth every 4 (four) hours as needed (for severe cough).       No current facility-administered medications on file prior to visit.   Review of Systems Constitutional: Negative for diaphoresis, activity change, appetite change or unexpected weight change.  HENT: Negative for hearing loss, ear pain, facial swelling, mouth sores and neck stiffness.   Eyes: Negative for pain, redness and visual disturbance.  Respiratory: Negative for shortness of breath and wheezing.   Cardiovascular: Negative for chest pain and palpitations.  Gastrointestinal: Negative for diarrhea, blood in stool, abdominal distention or other pain Genitourinary: Negative for hematuria, flank pain or  change in urine volume.  Musculoskeletal: Negative for myalgias and joint swelling.  Skin: Negative for color change and wound.  Neurological: Negative for syncope and numbness. other than noted Hematological: Negative for adenopathy.  Psychiatric/Behavioral: Negative for hallucinations, self-injury, decreased concentration and agitation.      Objective:   Physical Exam BP 108/80  Pulse 84  Temp(Src) 97.4 F (36.3 C) (Oral)  Ht 5' 1.5" (1.562 m)  Wt 211 lb 8 oz (95.936 kg)  BMI 39.32 kg/m2  SpO2 98% VS noted,  Constitutional: Pt is oriented to person, place, and time. Appears well-developed and well-nourished.  Head: Normocephalic and atraumatic.  Right Ear: External ear normal.  Left Ear: External ear normal.  Nose: Nose normal.  Mouth/Throat: Oropharynx is clear and moist.  Eyes: Conjunctivae and EOM are normal. Pupils are equal, round, and reactive to light.  Neck: Normal range of motion. Neck supple. No JVD present. No tracheal deviation present.  Cardiovascular: Normal rate, regular rhythm, normal heart sounds and intact distal pulses.   Pulmonary/Chest: Effort normal and breath sounds normal.  Abdominal: Soft. Bowel sounds are normal. There is no tenderness. No HSM  Musculoskeletal: Normal range of motion. Exhibits no edema.  Lymphadenopathy:  Has no cervical adenopathy.  Neurological: Pt is alert and oriented to person, place, and time. Pt has normal reflexes. No cranial nerve deficit.  Skin: Skin is warm and dry. No rash noted.  Psychiatric:  Has  normal mood and affect. Behavior is normal.     Assessment & Plan:

## 2013-05-06 NOTE — Assessment & Plan Note (Signed)

## 2013-05-06 NOTE — Assessment & Plan Note (Signed)
Persistent, ? Etiology, doubt RTA, for renal referral

## 2013-05-06 NOTE — Patient Instructions (Signed)
You had the tetanus (Tdap), and shingles shot today Please continue all other medications as before, and refills have been done if requested. You will be contacted regarding the referral for: Renal, to look into reason for the persistent low potassium Please keep your appointments with your specialists as you have planned You will be contacted regarding the referral for: colonoscopy Please continue your efforts at being more active, low cholesterol diet, and weight control. You are otherwise up to date with prevention measures today. Please go to the LAB in the Basement (turn left off the elevator) for the tests to be done today You will be contacted by phone if any changes need to be made immediately.  Otherwise, you will receive a letter about your results with an explanation  Please remember to sign up for MyChart if you have not done so, as this will be important to you in the future with finding out test results, communicating by private email, and scheduling acute appointments online when needed.  Please return in 6 months, or sooner if needed, with Lab testing done 3-5 days before

## 2013-05-07 ENCOUNTER — Encounter: Payer: Self-pay | Admitting: Internal Medicine

## 2013-05-07 LAB — URINALYSIS, ROUTINE W REFLEX MICROSCOPIC
Ketones, ur: NEGATIVE
Leukocytes, UA: NEGATIVE
RBC / HPF: NONE SEEN (ref 0–?)
Specific Gravity, Urine: <=1.005 (ref 1.000–1.030)
Total Protein, Urine: NEGATIVE
Urine Glucose: NEGATIVE
pH: 6.5 (ref 5.0–8.0)

## 2013-05-08 ENCOUNTER — Encounter: Payer: Self-pay | Admitting: Gastroenterology

## 2013-05-15 ENCOUNTER — Encounter: Payer: BC Managed Care – PPO | Admitting: Internal Medicine

## 2013-06-03 ENCOUNTER — Encounter: Payer: Self-pay | Admitting: Internal Medicine

## 2013-06-03 ENCOUNTER — Ambulatory Visit (INDEPENDENT_AMBULATORY_CARE_PROVIDER_SITE_OTHER): Payer: BC Managed Care – PPO | Admitting: Internal Medicine

## 2013-06-03 VITALS — BP 128/84 | HR 78 | Temp 97.7°F | Ht 61.0 in | Wt 210.2 lb

## 2013-06-03 DIAGNOSIS — J328 Other chronic sinusitis: Secondary | ICD-10-CM

## 2013-06-03 DIAGNOSIS — J45909 Unspecified asthma, uncomplicated: Secondary | ICD-10-CM

## 2013-06-03 MED ORDER — PREDNISONE (PAK) 10 MG PO TABS: ORAL_TABLET | ORAL | Status: DC

## 2013-06-03 MED ORDER — AMOXICILLIN-POT CLAVULANATE 875-125 MG PO TABS: 1.0000 | ORAL_TABLET | Freq: Two times a day (BID) | ORAL | Status: DC

## 2013-06-03 NOTE — Progress Notes (Signed)
Subjective:     Patient ID: Andrea Mason, female   DOB: [birth year]  MRN: [id number] HPI  88 yobf never smoker followed by Dr Maple Hudson for allergic rhinitis and asthma   02/05/2013 1st pulmonary eval in EPIC era baseline = completely better on qvar 80 2bid and only use rescue once a week at baseline then much worse starting 3/3 with cough/ congestion/ green mucus and rescue x one by time of ov at 2pm.  No resting sob, very hoarse with harsh barking cough >Augmentin rx   03/17/2013 Follow up and med calendar   no change rx, use med calendar   04/18/2013  ov/Andrea Mason acute w/in no calendar Chief Complaint  Patient presents with  . Acute Visit    Pt c/o wheezing for the past several wks, worse today after being exposed to fragrences. She also c/o cough since last night, non prod.    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 rec Prednisone 10 mg take  4 each am x 2 days,   2 each am x 2 days,  1 each am x2days and stop Only use your albuterol as a rescue    06/03/2013 f/u ov/Andrea Mason f/u asthma/ chronic rhinitis Chief Complaint  Patient presents with  . Follow-up    6 week follow up Complains of green mucous   abrupt cough onset x one week, not needing proaire, assoc with nasal congestion but minimal sob.    No obvious daytime variabilty or assoc c  cp or chest tightness, subjective wheeze overt   hb symptoms. No unusual exp hx or h/o childhood pna/ asthma or premature birth to her knowledge.   Sleeping ok without nocturnal  or early am exacerbation  of respiratory  c/o's or need for noct saba. Also denies any obvious fluctuation of symptoms with weather or environmental changes or other aggravating or alleviating factors except as outlined above   Current Medications, Allergies, Past Medical History, Past Surgical History, Family History, and Social History were reviewed in [employer] record.  ROS  The following are not  active complaints unless bolded sore throat, dysphagia, dental problems, itching, sneezing,  nasal congestion or excess/ purulent secretions, ear ache,   fever, chills, sweats, unintended wt loss, pleuritic or exertional cp, hemoptysis,  orthopnea pnd or leg swelling, presyncope, palpitations, heartburn, abdominal pain, anorexia, nausea, vomiting, diarrhea  or change in bowel or urinary habits, change in stools or urine, dysuria,hematuria,  rash, arthralgias, visual complaints, headache, numbness weakness or ataxia or problems with walking or coordination,  change in mood/affect or memory.                  Objective:   Physical Exam   Obese AAF mod hoarse nad p saba Wt Readings from Last 3 Encounters:  04/18/13 211 lb 9.6 oz (95.981 kg)  03/17/13 213 lb (96.616 kg)  02/05/13 213 lb (96.616 kg)      HEENT: nl dentition,  and orophanx. Nl external ear canals without cough reflex. Pale boggy nasal mucosa   NECK :  without JVD/Nodes/TM/ nl carotid upstrokes bilaterally   LUNGS: no acc muscle use, clear to A and P bilaterally without cough on insp or exp maneuvers   CV:  RRR  no s3 or murmur or increase in P2, no edema   ABD:  soft and nontender with nl excursion in the supine position. No bruits or organomegaly, bowel sounds nl  MS:  warm without deformities, calf tenderness, cyanosis or clubbing  SKIN: warm and dry without lesions           Assessment:

## 2013-06-03 NOTE — Patient Instructions (Addendum)
augmentin 875 twice daily x 10 days Prednisone 10 mg take  4 each am x 2 days,   2 each am x 2 days,  1 each am x 2 days and stop  Need follow up with Dr Maple Hudson within the next 4 weeks - return to pulmonary as needed

## 2013-06-05 ENCOUNTER — Telehealth: Payer: Self-pay | Admitting: Internal Medicine

## 2013-06-05 MED ORDER — BECLOMETHASONE DIPROPIONATE 80 MCG/ACT IN AERS: 2.0000 | INHALATION_SPRAY | Freq: Two times a day (BID) | RESPIRATORY_TRACT | Status: DC

## 2013-06-05 NOTE — Telephone Encounter (Signed)
Called, spoke with pt. This is a [facility] pt. We currently have no qvar samples available. Pt requesting rx -- sent to [facility] [address]  Pt aware and voiced no further questions or concerns at this time.

## 2013-06-07 NOTE — Assessment & Plan Note (Signed)
Recurrent flare despite approp use of afrin and nasal steroids > rx augmentin x 10 days and 6 days of prednisone     Each maintenance medication was reviewed in detail including most importantly the difference between maintenance and as needed and under what circumstances the prns are to be used. This was done in the context of a medication calendar review which provided the patient with a user-friendly unambiguous mechanism for medication administration and reconciliation and provides an action plan for all active problems. It is critical that this be shown to every doctor  for modification during the office visit if necessary so the patient can use it as a working document.

## 2013-06-07 NOTE — Assessment & Plan Note (Signed)
-   hfa 75% 04/18/13  -med calendar 03/17/2013   Generally doing better with use of med calendar > All goals of chronic asthma control met including optimal function and elimination of symptoms with minimal need for rescue therapy.  Contingencies discussed in full including contacting this office immediately if not controlling the symptoms using the rule of two's.     Focus needs to be on rx of rhinitis/ sinusitis > referred back to Dr Maple Hudson ENT prn

## 2013-06-10 ENCOUNTER — Telehealth: Payer: Self-pay | Admitting: Internal Medicine

## 2013-06-10 NOTE — Telephone Encounter (Signed)
We have samples of equivalent med she can come by and pick up 7/9 any time

## 2013-06-10 NOTE — Telephone Encounter (Signed)
I'll search the closet in am 7/9

## 2013-06-10 NOTE — Telephone Encounter (Signed)
This is a pulmonary patient of MW-will send to him to address. Pt states that QVAR is too expensive and wanted to know if she can use combivent and rescue inhaler instead or what else can be used that may be cheaper.

## 2013-06-10 NOTE — Telephone Encounter (Signed)
What med sample did you have in mind?

## 2013-06-11 ENCOUNTER — Telehealth: Payer: Self-pay | Admitting: Internal Medicine

## 2013-06-11 DIAGNOSIS — K769 Liver disease, unspecified: Secondary | ICD-10-CM

## 2013-06-11 NOTE — Telephone Encounter (Signed)
Last K was OK, so I dont think needs to see nephrology (kidney doctor); would not need to see urology  OK for u/s for spot on liver on CT 2010
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
## 2013-06-11 NOTE — Telephone Encounter (Signed)
Please ask pt for the reason

## 2013-06-11 NOTE — Telephone Encounter (Signed)
Requesting referral to urologist

## 2013-06-11 NOTE — Telephone Encounter (Signed)
Spoke with pt and advised that samples were left at front desk.  Instructions given.  Pt verbalized understanding.

## 2013-06-11 NOTE — Telephone Encounter (Signed)
alvesco 160 samples x 5 boxes to use Take 2 puffs first thing in am and then another 2 puffs about 12 hours later.    will leave at front desk

## 2013-06-12 NOTE — Telephone Encounter (Signed)
Patient informed of MD instructions and referral to U/S

## 2013-06-17 ENCOUNTER — Encounter: Payer: Self-pay | Admitting: Gastroenterology

## 2013-06-17 ENCOUNTER — Ambulatory Visit (AMBULATORY_SURGERY_CENTER): Payer: BC Managed Care – PPO | Admitting: *Deleted

## 2013-06-17 VITALS — Ht 61.0 in | Wt 210.0 lb

## 2013-06-17 DIAGNOSIS — Z1211 Encounter for screening for malignant neoplasm of colon: Secondary | ICD-10-CM

## 2013-06-17 MED ORDER — NA SULFATE-K SULFATE-MG SULF 17.5-3.13-1.6 GM/177ML PO SOLN: 1.0000 | Freq: Once | ORAL | Status: DC

## 2013-06-17 NOTE — Progress Notes (Signed)
No egg or soy allergy. No anesthesia problems.  

## 2013-06-18 ENCOUNTER — Encounter: Payer: Self-pay | Admitting: Internal Medicine

## 2013-06-18 ENCOUNTER — Ambulatory Visit
Admission: RE | Admit: 2013-06-18 | Discharge: 2013-06-18 | Disposition: A | Discharge status: Home / Self Care | Payer: BC Managed Care – PPO | Source: Ambulatory Visit | Attending: Internal Medicine | Admitting: Internal Medicine

## 2013-06-18 DIAGNOSIS — K769 Liver disease, unspecified: Secondary | ICD-10-CM

## 2013-06-27 ENCOUNTER — Telehealth: Payer: Self-pay | Admitting: Gastroenterology

## 2013-06-30 ENCOUNTER — Ambulatory Visit (INDEPENDENT_AMBULATORY_CARE_PROVIDER_SITE_OTHER): Payer: BC Managed Care – PPO | Admitting: Internal Medicine

## 2013-06-30 ENCOUNTER — Other Ambulatory Visit: Payer: BC Managed Care – PPO

## 2013-06-30 ENCOUNTER — Encounter: Payer: Self-pay | Admitting: Internal Medicine

## 2013-06-30 VITALS — BP 138/76 | HR 76 | Ht 61.75 in | Wt 214.2 lb

## 2013-06-30 DIAGNOSIS — J3089 Other allergic rhinitis: Secondary | ICD-10-CM

## 2013-06-30 DIAGNOSIS — J328 Other chronic sinusitis: Secondary | ICD-10-CM

## 2013-06-30 DIAGNOSIS — J330 Polyp of nasal cavity: Secondary | ICD-10-CM

## 2013-06-30 DIAGNOSIS — J309 Allergic rhinitis, unspecified: Secondary | ICD-10-CM

## 2013-06-30 LAB — ALLERGY FULL PROFILE
Alternaria Alternata: 0.11 kU/L — ABNORMAL HIGH
Bahia Grass: 4.65 kU/L — ABNORMAL HIGH
Cat Dander: <0.1 kU/L
Common Ragweed: 6.56 kU/L — ABNORMAL HIGH
Curvularia lunata: 0.12 kU/L — ABNORMAL HIGH
Dog Dander: 0.18 kU/L — ABNORMAL HIGH
Fescue: 4.5 kU/L — ABNORMAL HIGH
Goldenrod: 1.63 kU/L — ABNORMAL HIGH
Helminthosporium halodes: 0.11 kU/L — ABNORMAL HIGH
House Dust Hollister: 0.26 kU/L — ABNORMAL HIGH
Lamb's Quarters: 0.96 kU/L — ABNORMAL HIGH
Plantain: 1.49 kU/L — ABNORMAL HIGH
Sycamore Tree: 0.96 kU/L — ABNORMAL HIGH

## 2013-06-30 NOTE — Telephone Encounter (Signed)
Patient already purchased Suprep Wants refund from pharmacy Will call us back for sample

## 2013-06-30 NOTE — Progress Notes (Signed)
Subjective:     Patient ID: Andrea Mason, female   DOB: Apr 19, 1951  MRN: [id number] HPI  44 yobf never smoker followed by Dr Maple Hudson for allergic rhinitis and asthma   02/05/2013 1st pulmonary eval in EPIC era baseline = completely better on qvar 80 2bid and only use rescue once a week at baseline then much worse starting 3/3 with cough/ congestion/ green mucus and rescue x one by time of ov at 2pm.  No resting sob, very hoarse with harsh barking cough >Augmentin rx   03/17/2013 Follow up and med calendar   no change rx, use med calendar   04/18/2013  ov/Wert acute w/in no calendar Chief Complaint  Patient presents with  . Acute Visit    Pt c/o wheezing for the past several wks, worse today after being exposed to fragrences. She also c/o cough since last night, non prod.    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 rec Prednisone 10 mg take  4 each am x 2 days,   2 each am x 2 days,  1 each am x2days and stop Only use your albuterol as a rescue    06/03/2013 f/u ov/Wert f/u asthma/ chronic rhinitis Chief Complaint  Patient presents with  . Follow-up    6 week follow up Complains of green mucous   abrupt cough onset x one week, not needing proaire, assoc with nasal congestion but minimal sob. No obvious daytime variabilty or assoc c  cp or chest tightness, subjective wheeze overt   hb symptoms. No unusual exp hx or h/o childhood pna/ asthma or premature birth to her knowledge.   Sleeping ok without nocturnal  or early am exacerbation  of respiratory  c/o's or need for noct saba. Also denies any obvious fluctuation of symptoms with weather or environmental changes or other aggravating or alleviating factors except as outlined above   06/30/13- 62 yobf never smoker followed byDr Sherene Sires for asthma/ Last saw Dr Maple Hudson 2012-for allergic rhinitis and asthma  FOLLOW UP:  pt reports she has had a few episodes with sinus infections/URI requing rounds of  abx-- would like to discuss restarting allergy vaccine per Dr. Sherene Sires patient needs allegies more under control She was on allergy vaccine for one or 2 years, stopping 2 years ago-drifted off. Complains of frequent nasal congestion, green nasal discharge treated with prednisone and amoxicillin. Right eye waters. Some sneeze- perennial but worse in spring and fall pollen seasons. Using Zyrtec and Flonase. Denies headaches, ear pressure or itching.  History of sinus surgery twice by Dr. Annalee Genta. CT head 2012-chronic sinus disease  Current Medications, Allergies, Past Medical History, Past Surgical History, Family History, and Social History were reviewed in [employer] record.  ROS  The following are not active complaints unless bolded sore throat, dysphagia, dental problems, itching, sneezing,  nasal congestion or excess/ purulent secretions, ear ache,   fever, chills, sweats, unintended wt loss, pleuritic or exertional cp, hemoptysis,  orthopnea pnd or leg swelling, presyncope, palpitations, heartburn, abdominal pain, anorexia, nausea, vomiting, diarrhea  or change in bowel or urinary habits, change in stools or urine, dysuria,hematuria,  rash, arthralgias, visual complaints, headache, numbness weakness or ataxia or problems with walking or coordination,  change in mood/affect or memory.     OBJ- Physical Exam General- Alert, Oriented, Affect-appropriate, Distress- none acute Skin- rash-none, lesions- none, excoriation- none Lymphadenopathy- none Head- atraumatic  Eyes- Gross vision intact, PERRLA, conjunctivae and secretions clear            Ears- Hearing, canals-normal            Nose- Clear, no-Septal dev, mucus, polyps, erosion, perforation             Throat- Mallampati II , mucosa clear , drainage- none, tonsils- atrophic Neck- flexible , trachea midline, no stridor , thyroid nl, carotid no bruit Chest - symmetrical excursion , unlabored            Heart/CV- RRR , no murmur , no gallop  , no rub, nl s1 s2                           - JVD- none , edema- none, stasis changes- none, varices- none           Lung- +course/raspy breath sounds, unlabored, wheeze+slight, cough- none , dullness-none, rub- none           Chest wall-  Abd-  Br/ Gen/ Rectal- Not done, not indicated Extrem- cyanosis- none, clubbing, none, atrophy- none, strength- nl Neuro- grossly intact to observation      Assessment:

## 2013-06-30 NOTE — Patient Instructions (Addendum)
Order- lab for Allergy Profile  Dx allergic rhinitis, asthma with bronchitis  For allergic eye- try Systane to see if it could be from dry eyes. If that doesn't help enough, try an otc allergy eye drop like Allaway or Naphcon

## 2013-07-01 ENCOUNTER — Encounter: Payer: Self-pay | Admitting: Gastroenterology

## 2013-07-01 ENCOUNTER — Ambulatory Visit (AMBULATORY_SURGERY_CENTER): Payer: BC Managed Care – PPO | Admitting: Gastroenterology

## 2013-07-01 VITALS — BP 176/87 | HR 71 | Temp 98.7°F | Resp 20 | Ht 61.0 in | Wt 210.0 lb

## 2013-07-01 DIAGNOSIS — D126 Benign neoplasm of colon, unspecified: Secondary | ICD-10-CM

## 2013-07-01 DIAGNOSIS — Z800 Family history of malignant neoplasm of digestive organs: Secondary | ICD-10-CM

## 2013-07-01 DIAGNOSIS — Z1211 Encounter for screening for malignant neoplasm of colon: Secondary | ICD-10-CM

## 2013-07-01 MED ORDER — SODIUM CHLORIDE 0.9 % IV SOLN: 500.0000 mL | INTRAVENOUS | Status: DC

## 2013-07-01 NOTE — Progress Notes (Signed)
Called to room to assist during endoscopic procedure.  Patient ID and intended procedure confirmed with present staff. Received instructions for my participation in the procedure from the performing physician.  

## 2013-07-01 NOTE — Progress Notes (Signed)
No complaints noted in the recovery room. Maw   

## 2013-07-01 NOTE — Patient Instructions (Addendum)
YOU HAD AN ENDOSCOPIC PROCEDURE TODAY AT THE [facility]: Refer to the procedure report that was given to you for any specific questions about what was found during the examination.  If the procedure report does not answer your questions, please call your gastroenterologist to clarify.  If you requested that your care partner not be given the details of your procedure findings, then the procedure report has been included in a sealed envelope for you to review at your convenience later.  YOU SHOULD EXPECT: Some feelings of bloating in the abdomen. Passage of more gas than usual.  Walking can help get rid of the air that was put into your GI tract during the procedure and reduce the bloating. If you had a lower endoscopy (such as a colonoscopy or flexible sigmoidoscopy) you may notice spotting of blood in your stool or on the toilet paper. If you underwent a bowel prep for your procedure, then you may not have a normal bowel movement for a few days.  DIET: Your first meal following the procedure should be a light meal and then it is ok to progress to your normal diet.  A half-sandwich or bowl of soup is an example of a good first meal.  Heavy or fried foods are harder to digest and may make you feel nauseous or bloated.  Likewise meals heavy in dairy and vegetables can cause extra gas to form and this can also increase the bloating.  Drink plenty of fluids but you should avoid alcoholic beverages for 24 hours.  ACTIVITY: Your care partner should take you home directly after the procedure.  You should plan to take it easy, moving slowly for the rest of the day.  You can resume normal activity the day after the procedure however you should NOT DRIVE or use heavy machinery for 24 hours (because of the sedation medicines used during the test).    SYMPTOMS TO REPORT IMMEDIATELY: A gastroenterologist can be reached at any hour.  During normal business hours, 8:30 AM to 5:00 PM Monday through Friday,  call [phone].  After hours and on weekends, please call the GI answering service at [phone] who will take a message and have the physician on call contact you.   Following lower endoscopy (colonoscopy or flexible sigmoidoscopy):  Excessive amounts of blood in the stool  Significant tenderness or worsening of abdominal pains  Swelling of the abdomen that is new, acute  Fever of 100F or higher    FOLLOW UP: If any biopsies were taken you will be contacted by phone or by letter within the next 1-3 weeks.  Call your gastroenterologist if you have not heard about the biopsies in 3 weeks.  Our staff will call the home number listed on your records the next business day following your procedure to check on you and address any questions or concerns that you may have at that time regarding the information given to you following your procedure. This is a courtesy call and so if there is no answer at the home number and we have not heard from you through the emergency physician on call, we will assume that you have returned to your regular daily activities without incident.  SIGNATURES/CONFIDENTIALITY: You and/or your care partner have signed paperwork which will be entered into your electronic medical record.  These signatures attest to the fact that that the information above on your After Visit Summary has been reviewed and is understood.  Full responsibility of the confidentiality   of this discharge information lies with you and/or your care-partner.   A handout was given to your care partner on polyps. You may resume your current medications today. Please call if any questions or concerns.

## 2013-07-01 NOTE — Progress Notes (Signed)
Lidocaine-40mg IV prior to Propofol InductionPropofol given over incremental dosages 

## 2013-07-01 NOTE — Op Note (Addendum)
[facility] [address] [place], [address]   COLONOSCOPY PROCEDURE REPORT  PATIENT: Andrea, Mason  MR#: [id number] BIRTHDATE: 12/01/1951 , 62  yrs. old GENDER: Female ENDOSCOPIST: Louis Meckel, MD REFERRED XB:JYNWG John, M.D. PROCEDURE DATE:  07/01/2013 PROCEDURE:   Colonoscopy with snare polypectomy ASA CLASS:   Class II INDICATIONS:FH of colon cancer (brother) MEDICATIONS: MAC sedation, administered by CRNA and propofol (Diprivan) 250mg  IV  DESCRIPTION OF PROCEDURE:   After the risks benefits and alternatives of the procedure were thoroughly explained, informed consent was obtained.  A digital rectal exam revealed no abnormalities of the rectum.   The LB [id number]  endoscope was introduced through the anus and advanced to the cecum, which was identified by both the appendix and ileocecal valve. No adverse events experienced.   The quality of the prep was excellent using Suprep  The instrument was then slowly withdrawn as the colon was fully examined.      COLON FINDINGS: A flat polyp measuring 3 mm in size was found in the sigmoid colon.  A polypectomy was performed with a cold snare.  The resection was complete and the polyp tissue was completely retrieved.   The colon mucosa was otherwise normal.  Retroflexed views revealed no abnormalities. The time to cecum=4 minutes 26 seconds.  Withdrawal time=7 minutes 15 seconds.  The scope was withdrawn and the procedure completed. COMPLICATIONS: There were no complications.  ENDOSCOPIC IMPRESSION: 1.   Flat polyp measuring 3 mm in size was found in the sigmoid colon; polypectomy was performed with a cold snare 2.   The colon mucosa was otherwise normal  RECOMMENDATIONS: I given your family history of colon cancer recommend repeat colonoscopy in 5 years   eSigned:  Louis Meckel, MD 07/01/2013 9:55 AM Revised: 07/01/2013 9:55 AM  cc:

## 2013-07-02 ENCOUNTER — Telehealth: Payer: Self-pay | Admitting: *Deleted

## 2013-07-02 NOTE — Telephone Encounter (Signed)
  Follow up Call-  Call back number 07/01/2013  Post procedure Call Back phone  # [phone]  Permission to leave phone message Yes     Patient questions:  Do you have a fever, pain , or abdominal swelling? no Pain Score  0 *  Have you tolerated food without any problems? yes  Have you been able to return to your normal activities? yes  Do you have any questions about your discharge instructions: Diet   no Medications  no Follow up visit  no  Do you have questions or concerns about your Care? no  Actions: * If pain score is 4 or above: No action needed, pain <4.

## 2013-07-11 NOTE — Progress Notes (Signed)
Quick Note:  Pt aware of results. ______ 

## 2013-07-12 NOTE — Assessment & Plan Note (Signed)
Unclear how much of her respiratory problems are allergic as opposed to chronic sinusitis. Plan-I will pull her allergy skin test documentation and we will update lab-allergy profile. I will decide whether she would be better off having updated skin tests before decision about restarting allergy vaccine.

## 2013-07-12 NOTE — Assessment & Plan Note (Signed)
No anterior polyps seen on exam today. Turbinate edema may obscure.

## 2013-07-14 ENCOUNTER — Encounter: Payer: Self-pay | Admitting: Gastroenterology

## 2013-08-18 ENCOUNTER — Telehealth: Payer: Self-pay | Admitting: Internal Medicine

## 2013-08-18 MED ORDER — AZITHROMYCIN 250 MG PO TABS: 250.0000 mg | ORAL_TABLET | ORAL | Status: DC

## 2013-08-18 NOTE — Telephone Encounter (Signed)
Pt aware of recs per CY Zpak sent to pharm. Pt to keep upcoming appt 9/18 with CY

## 2013-08-18 NOTE — Telephone Encounter (Signed)
Spoke with patient--- Patient c/o worsening cold like symptoms x 1 week Prod cough with green mucus and sore throat, states it feels like her throat is swollen Also reports F/C/S with temps of 96.8 and 95.9 in which she states this is high for her Patient states she has been taking generic Tylenol and generic Mucinex DM from time to time  Patient is scheduled to be seen Thursday 9/18 by Dr. Maple Hudson but unsure if she should wait this long as sx seem to be getting worse Dr. Maple Hudson please advise, thank you!  Last OV 7/28 Next OV 9/18  [facility]  Allergies  Allergen Reactions  . Ivp Dye [Iodinated Diagnostic Agents] Nausea And Vomiting    Reaction: hot flashes  . Promethazine-Codeine Nausea And Vomiting

## 2013-08-18 NOTE — Telephone Encounter (Signed)
Per CY-offer patient Zpak #1 take as directed no refills.

## 2013-08-21 ENCOUNTER — Encounter: Payer: Self-pay | Admitting: Internal Medicine

## 2013-08-21 ENCOUNTER — Ambulatory Visit (INDEPENDENT_AMBULATORY_CARE_PROVIDER_SITE_OTHER): Payer: BC Managed Care – PPO | Admitting: Internal Medicine

## 2013-08-21 VITALS — BP 140/98 | HR 87 | Ht 61.75 in | Wt 213.4 lb

## 2013-08-21 DIAGNOSIS — J302 Other seasonal allergic rhinitis: Secondary | ICD-10-CM

## 2013-08-21 DIAGNOSIS — J328 Other chronic sinusitis: Secondary | ICD-10-CM

## 2013-08-21 DIAGNOSIS — J309 Allergic rhinitis, unspecified: Secondary | ICD-10-CM

## 2013-08-21 DIAGNOSIS — Z23 Encounter for immunization: Secondary | ICD-10-CM

## 2013-08-21 DIAGNOSIS — J45909 Unspecified asthma, uncomplicated: Secondary | ICD-10-CM

## 2013-08-21 MED ORDER — MONTELUKAST SODIUM 10 MG PO TABS: 10.0000 mg | ORAL_TABLET | Freq: Every day | ORAL | Status: DC

## 2013-08-21 NOTE — Patient Instructions (Addendum)
Recommend you see Dr Annalee Genta again for ENT. I think your biggest problem is chronic sinusitis.  Use your Neti pot to rinse your nasal passages daily  Try adding script for singulair one daily  We can reconsider allergy shots again in the future if it becomes an option  Follow with Dr Sherene Sires in a couple of months for your breathing

## 2013-08-21 NOTE — Progress Notes (Signed)
Subjective:     Patient ID: Andrea Mason, female   DOB: 1951/04/05  MRN: [id number] HPI  62 yobf never smoker followed by Dr Maple Hudson for allergic rhinitis and asthma   02/05/2013 1st pulmonary eval in EPIC era baseline = completely better on qvar 80 2bid and only use rescue once a week at baseline then much worse starting 3/3 with cough/ congestion/ green mucus and rescue x one by time of ov at 2pm.  No resting sob, very hoarse with harsh barking cough >Augmentin rx   03/17/2013 Follow up and med calendar   no change rx, use med calendar   04/18/2013  ov/Wert acute w/in no calendar Chief Complaint  Patient presents with  . Acute Visit    Pt c/o wheezing for the past several wks, worse today after being exposed to fragrences. She also c/o cough since last night, non prod.    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 rec Prednisone 10 mg take  4 each am x 2 days,   2 each am x 2 days,  1 each am x2days and stop Only use your albuterol as a rescue    06/03/2013 f/u ov/Wert f/u asthma/ chronic rhinitis Chief Complaint  Patient presents with  . Follow-up    6 week follow up Complains of green mucous   abrupt cough onset x one week, not needing proaire, assoc with nasal congestion but minimal sob. No obvious daytime variabilty or assoc c  cp or chest tightness, subjective wheeze overt   hb symptoms. No unusual exp hx or h/o childhood pna/ asthma or premature birth to her knowledge.   Sleeping ok without nocturnal  or early am exacerbation  of respiratory  c/o's or need for noct saba. Also denies any obvious fluctuation of symptoms with weather or environmental changes or other aggravating or alleviating factors except as outlined above   06/30/13- 62 yobf never smoker followed byDr Sherene Sires for asthma/ Last saw Dr Maple Hudson 2012-for allergic rhinitis and asthma  FOLLOW UP:  pt reports she has had a few episodes with sinus infections/URI requing rounds of  abx-- would like to discuss restarting allergy vaccine per Dr. Sherene Sires patient needs allegies more under control She was on allergy vaccine for one or 2 years, stopping 2 years ago-drifted off. Complains of frequent nasal congestion, green nasal discharge treated with prednisone and amoxicillin. Right eye waters. Some sneeze- perennial but worse in spring and fall pollen seasons. Using Zyrtec and Flonase. Denies headaches, ear pressure or itching.  History of sinus surgery twice by Dr. Annalee Genta. CT head 2012-chronic sinus disease  Current Medications, Allergies, Past Medical History, Past Surgical History, Family History, and Social History were reviewed in [employer] record.  08/21/13-62 yobf never smoker followed by Dr Sherene Sires for asthma/  Dr Maple Hudson 2012-for allergic rhinitis and asthma  FOLLOWS FOR: Not on Allergy vaccine ; has not got Zpak that was called into pharmacy recently We reviewed skin testing from 2011. She had tried to start allergy vaccine but could not afford it and says that she cannot afford it now. Perennial rhinitis and asthma. Can't afford to fill recent prescription for Zithromax. When she takes Augmentin she says it loosens ropy mucus from her nose. Continues daily Zyrtec, Alvesco and uses rescue inhaler 2 or 3 days per week.  Has seen Dr Delfin Edis ENT/ [place] and Dr Annalee Genta ENT..  ROS  The following are not  active complaints unless bolded sore throat, dysphagia, dental problems, itching, sneezing,  nasal congestion or excess/ purulent secretions, ear ache,   fever, chills, sweats, unintended wt loss, pleuritic or exertional cp, hemoptysis,  orthopnea pnd or leg swelling, presyncope, palpitations, heartburn, abdominal pain, anorexia, nausea, vomiting, diarrhea  or change in bowel or urinary habits, change in stools or urine, dysuria,hematuria,  rash, arthralgias, visual complaints, headache, numbness weakness or ataxia or problems with walking or  coordination,  change in mood/affect or memory.     OBJ- Physical Exam General- Alert, Oriented, Affect-appropriate, Distress- none acute Skin- rash-none, lesions- none, excoriation- none Lymphadenopathy- none Head- atraumatic            Eyes- Gross vision intact, PERRLA, conjunctivae and secretions clear            Ears- Hearing, canals-normal            Nose- Clear, no-Septal dev, mucus, polyps, erosion, perforation             Throat- Mallampati IV , mucosa clear , drainage+ thick mucus, tonsils- atrophic, + hoarse Neck- flexible , trachea midline, no stridor , thyroid nl, carotid no bruit Chest - symmetrical excursion , unlabored           Heart/CV- RRR , no murmur , no gallop  , no rub, nl s1 s2                           - JVD- none , edema- none, stasis changes- none, varices- none           Lung- Clear, unlabored, wheeze- none, cough+ light , dullness-none, rub- none           Chest wall-  Abd-  Br/ Gen/ Rectal- Not done, not indicated Extrem- cyanosis- none, clubbing, none, atrophy- none, strength- nl Neuro- grossly intact to observation      Assessment:

## 2013-08-29 NOTE — Assessment & Plan Note (Addendum)
Lower respiratory symptoms are controlled Sinus disease as more important now. Plan- flu shot

## 2013-08-29 NOTE — Assessment & Plan Note (Signed)
Impressive postnasal drip may indicate chronic sinusitis as a significant aggravating factor for her lower respiratory problems. Plan- She will follow up with her ENT for chronic sinus disease. Use Neti pot.

## 2013-08-29 NOTE — Assessment & Plan Note (Signed)
Discussed environmental controls, antihistamines

## 2013-09-10 ENCOUNTER — Telehealth: Payer: Self-pay | Admitting: Internal Medicine

## 2013-09-10 MED ORDER — CICLESONIDE 160 MCG/ACT IN AERS: 2.0000 | INHALATION_SPRAY | Freq: Two times a day (BID) | RESPIRATORY_TRACT | Status: DC

## 2013-09-10 NOTE — Telephone Encounter (Signed)
I spoke with pt. She stated the alvesco is over $100. She has not tried calling her insurance yet. I advised her to call and see what is on her formulary that she feels she can afford and let us know. She will do so. Nothing further needed

## 2013-09-10 NOTE — Telephone Encounter (Signed)
[id number] x1 

## 2013-09-10 NOTE — Telephone Encounter (Signed)
I spoke with pt. She is aware and needed nothing further needed

## 2013-09-10 NOTE — Telephone Encounter (Signed)
Spoke with pt.  Pt states she was given samples of alvesco 160 and has been using it 2 puffs bid.  Pt states she has ran out of samples, and rx is working well.  Pt is requesting rx to [facility].  Pt's med list states pt is to take this 1 puff bid.  Dr. Sherene Sires, pls clarify how pt is to take alvesco so we can send rx. Thank you.

## 2013-09-10 NOTE — Telephone Encounter (Signed)
Ok to continue 2 bid x one year supply

## 2013-09-12 ENCOUNTER — Telehealth: Payer: Self-pay | Admitting: Internal Medicine

## 2013-09-12 NOTE — Telephone Encounter (Signed)
LMTCB

## 2013-09-15 MED ORDER — CICLESONIDE 160 MCG/ACT IN AERS: 2.0000 | INHALATION_SPRAY | Freq: Two times a day (BID) | RESPIRATORY_TRACT | Status: DC

## 2013-09-15 NOTE — Telephone Encounter (Signed)
Spoke with patient-she is aware that she will not qualify for patient assistance with [employer] for [employer] as she has insurance. I gave her the number for [employer] for Alvesco patient assistance [phone]. Pt will call and see if she qualifies for patient assistance and let us know asap. Then we will inform MW. Also, patient was left 2 samples of Alvesco at front for pick in the meantime.

## 2013-09-23 ENCOUNTER — Ambulatory Visit (INDEPENDENT_AMBULATORY_CARE_PROVIDER_SITE_OTHER): Payer: BC Managed Care – PPO | Admitting: Internal Medicine

## 2013-09-23 ENCOUNTER — Encounter: Payer: Self-pay | Admitting: Internal Medicine

## 2013-09-23 VITALS — BP 130/76 | HR 70 | Ht 61.75 in | Wt 216.0 lb

## 2013-09-23 DIAGNOSIS — J019 Acute sinusitis, unspecified: Secondary | ICD-10-CM

## 2013-09-23 DIAGNOSIS — J328 Other chronic sinusitis: Secondary | ICD-10-CM

## 2013-09-23 DIAGNOSIS — J330 Polyp of nasal cavity: Secondary | ICD-10-CM

## 2013-09-23 MED ORDER — AMOXICILLIN-POT CLAVULANATE 875-125 MG PO TABS: 1.0000 | ORAL_TABLET | Freq: Two times a day (BID) | ORAL | Status: DC

## 2013-09-23 NOTE — Patient Instructions (Signed)
Script sent for augmentin  After that, if you still feel you have a sinus infection, suggest you go back to Dr Annalee Genta

## 2013-09-23 NOTE — Progress Notes (Signed)
Subjective:     Patient ID: Andrea Mason, female   DOB: [birth year]  MRN: [id number] HPI  36 yobf never smoker followed by Dr Maple Hudson for allergic rhinitis and asthma   02/05/2013 1st pulmonary eval in EPIC era baseline = completely better on qvar 80 2bid and only use rescue once a week at baseline then much worse starting 3/3 with cough/ congestion/ green mucus and rescue x one by time of ov at 2pm.  No resting sob, very hoarse with harsh barking cough >Augmentin rx   03/17/2013 Follow up and med calendar   no change rx, use med calendar   04/18/2013  ov/Wert acute w/in no calendar Chief Complaint  Patient presents with  . Acute Visit    Pt c/o wheezing for the past several wks, worse today after being exposed to fragrences. She also c/o cough since last night, non prod.    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 rec Prednisone 10 mg take  4 each am x 2 days,   2 each am x 2 days,  1 each am x2days and stop Only use your albuterol as a rescue    06/03/2013 f/u ov/Wert f/u asthma/ chronic rhinitis Chief Complaint  Patient presents with  . Follow-up    6 week follow up Complains of green mucous   abrupt cough onset x one week, not needing proaire, assoc with nasal congestion but minimal sob. No obvious daytime variabilty or assoc c  cp or chest tightness, subjective wheeze overt   hb symptoms. No unusual exp hx or h/o childhood pna/ asthma or premature birth to her knowledge.   Sleeping ok without nocturnal  or early am exacerbation  of respiratory  c/o's or need for noct saba. Also denies any obvious fluctuation of symptoms with weather or environmental changes or other aggravating or alleviating factors except as outlined above   06/30/13- 62 yobf never smoker followed byDr Sherene Sires for asthma/ Last saw Dr Maple Hudson 2012-for allergic rhinitis and asthma  FOLLOW UP:  pt reports she has had a few episodes with sinus infections/URI requing rounds of  abx-- would like to discuss restarting allergy vaccine per Dr. Sherene Sires patient needs allegies more under control She was on allergy vaccine for one or 2 years, stopping 2 years ago-drifted off. Complains of frequent nasal congestion, green nasal discharge treated with prednisone and amoxicillin. Right eye waters. Some sneeze- perennial but worse in spring and fall pollen seasons. Using Zyrtec and Flonase. Denies headaches, ear pressure or itching.  History of sinus surgery twice by Dr. Annalee Genta. CT head 2012-chronic sinus disease  Current Medications, Allergies, Past Medical History, Past Surgical History, Family History, and Social History were reviewed in [employer] record.  08/21/13-62 yobf never smoker followed by Dr Sherene Sires for asthma/  Dr Maple Hudson 2012-for allergic rhinitis and asthma  FOLLOWS FOR: Not on Allergy vaccine ; has not got Zpak that was called into pharmacy recently We reviewed skin testing from 2011. She had tried to start allergy vaccine but could not afford it and says that she cannot afford it now. Perennial rhinitis and asthma. Can't afford to fill recent prescription for Zithromax. When she takes Augmentin she says it loosens ropy mucus from her nose. Continues daily Zyrtec, Alvesco and uses rescue inhaler 2 or 3 days per week.  Has seen Dr Delfin Edis ENT/ [place] and Dr Annalee Genta ENT..  09/23/13-  62 yobf never smoker  followed by Dr Sherene Sires for asthma/  Dr Maple Hudson for allergic rhinitis FOLLOWS FOR:has stopped vaccine; still working with insurance about  allergy coverage. Thinks she has a sinus infection. Sneezing and coughing with green from nose. Ears feel full and itch. She did not followup with Dr. Stasia Cavalier  ROS  The following are not active complaints unless bolded sore throat, dysphagia, dental problems, itching, sneezing,  nasal congestion or excess/ purulent secretions, ear ache,   fever, chills, sweats, unintended wt loss, pleuritic or exertional cp,  hemoptysis,  orthopnea pnd or leg swelling, presyncope, palpitations, heartburn, abdominal pain, anorexia, nausea, vomiting, diarrhea  or change in bowel or urinary habits, change in stools or urine, dysuria,hematuria,  rash, arthralgias, visual complaints, headache, numbness weakness or ataxia or problems with walking or coordination,  change in mood/affect or memory.     OBJ- Physical Exam General- Alert, Oriented, Affect-appropriate, Distress- none acute. obese Skin- rash-none, lesions- none, excoriation- none Lymphadenopathy- none Head- atraumatic            Eyes- Gross vision intact, PERRLA, conjunctivae and secretions clear            Ears- Hearing, canals-normal, TMs clear            Nose- Clear, no-Septal dev, mucus, polyps, erosion, perforation             Throat- Mallampati IV , mucosa clear , drainage-none, tonsils- atrophic, + hoarse Neck- flexible , trachea midline, no stridor , thyroid nl, carotid no bruit Chest - symmetrical excursion , unlabored           Heart/CV- RRR , no murmur , no gallop  , no rub, nl s1 s2                           - JVD- none , edema- none, stasis changes- none, varices- none           Lung- Clear, unlabored, wheeze+ end-expiratory, cough+ light , dullness-none, rub- none           Chest wall-  Abd-  Br/ Gen/ Rectal- Not done, not indicated Extrem- cyanosis- none, clubbing, none, atrophy- none, strength- nl Neuro- grossly intact to observation      Assessment:

## 2013-10-08 NOTE — Assessment & Plan Note (Signed)
No polyps seen at this examination

## 2013-10-08 NOTE — Assessment & Plan Note (Signed)
Treat as acute sinusitis Plan-Augmentin for 3 weeks

## 2013-10-09 NOTE — Assessment & Plan Note (Signed)
Treated as acute sinusitis Plan-Augmentin for 3 weeks, fluids and comfort measures discussed

## 2013-10-24 ENCOUNTER — Ambulatory Visit (INDEPENDENT_AMBULATORY_CARE_PROVIDER_SITE_OTHER): Payer: BC Managed Care – PPO | Admitting: Internal Medicine

## 2013-10-24 ENCOUNTER — Encounter: Payer: Self-pay | Admitting: Internal Medicine

## 2013-10-24 VITALS — BP 146/80 | HR 84 | Temp 97.4°F | Ht 61.5 in | Wt 221.0 lb

## 2013-10-24 DIAGNOSIS — J45909 Unspecified asthma, uncomplicated: Secondary | ICD-10-CM

## 2013-10-24 MED ORDER — OMEPRAZOLE 20 MG PO CPDR: 20.0000 mg | DELAYED_RELEASE_CAPSULE | ORAL | Status: DC

## 2013-10-24 MED ORDER — PREDNISONE (PAK) 10 MG PO TABS: ORAL_TABLET | ORAL | Status: DC

## 2013-10-24 MED ORDER — BUDESONIDE-FORMOTEROL FUMARATE 80-4.5 MCG/ACT IN AERO: INHALATION_SPRAY | RESPIRATORY_TRACT | Status: DC

## 2013-10-24 NOTE — Progress Notes (Signed)
Subjective:     Patient ID: Andrea Mason, female   DOB: 03-Jul-1951  MRN: [id number] HPI  44 yobf never smoker followed by Dr Annamaria Boots for allergic rhinitis and asthma   02/05/2013 1st pulmonary eval in EPIC era baseline = completely better on qvar 80 2bid and only use rescue once a week at baseline then much worse starting 3/3 with cough/ congestion/ green mucus and rescue x one by time of ov at 2pm.  No resting sob, very hoarse with harsh barking cough >Augmentin rx   03/17/2013 Follow up and med calendar   no change rx, use med calendar   04/18/2013  ov/Jalani Rominger acute w/in no calendar Chief Complaint  Patient presents with  . Acute Visit    Pt c/o wheezing for the past several wks, worse today after being exposed to fragrences. She also c/o cough since last night, non prod.    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 rec Prednisone 10 mg take  4 each am x 2 days,   2 each am x 2 days,  1 each am x2days and stop Only use your albuterol as a rescue    06/03/2013 f/u ov/Elford Evilsizer f/u asthma/ chronic rhinitis Chief Complaint  Patient presents with  . Follow-up    6 week follow up Complains of green mucous   abrupt cough onset x one week, not needing proaire, assoc with nasal congestion but minimal sob. Rec: augmentin/ pred  06/30/13- 86 yobf never smoker followed byDr Melvyn Novas for asthma/ Last saw Dr Annamaria Boots 2012-for allergic rhinitis and asthma  FOLLOW UP:  pt reports she has had a few episodes with sinus infections/URI requing rounds of abx-- would like to discuss restarting allergy vaccine per Dr. Melvyn Novas patient needs allegies more under control She was on allergy vaccine for one or 2 years, stopping 2 years ago-drifted off. Complains of frequent nasal congestion, green nasal discharge treated with prednisone and amoxicillin. Right eye waters. Some sneeze- perennial but worse in spring and fall pollen seasons. Using Zyrtec and Flonase. Denies headaches,  ear pressure or itching.  History of sinus surgery twice by Dr. [name]. CT head 2012-chronic sinus disease    08/21/13-62 yobf never smoker followed by Dr Melvyn Novas for asthma/  Dr Annamaria Boots [phone] allergic rhinitis and asthma  FOLLOWS FOR: Not on Allergy vaccine ; has not got Zpak that was called into pharmacy recently We reviewed skin testing from 2011. She had tried to start allergy vaccine but could not afford it and says that she cannot afford it now. Perennial rhinitis and asthma. Can't afford to fill recent prescription for Zithromax. When she takes Augmentin she says it loosens ropy mucus from her nose. Continues daily Zyrtec, Alvesco and uses rescue inhaler 2 or 3 days per week.  Has seen Dr Ayesha Mohair ENT/ [place] and Dr [name] ENT..  09/23/13-  32 yobf never smoker followed by Dr Melvyn Novas for asthma/  Dr Annamaria Boots for allergic rhinitis FOLLOWS FOR:has stopped vaccine; still working with insurance about  allergy coverage. Thinks she has a sinus infection. Sneezing and coughing with green from nose. Ears feel full and itch. She did not followup with Dr. Lovenia Shuck rec augmentin > did not take alvesco > ran out of samples and did not fill rx   10/24/2013 f/u ov/Nikkia Devoss re: cough > rhinitis > sob Chief Complaint  Patient presents with  . Acute Visit    Pt c/o sore throat, non prod  cough and PND x 1 wk.   also very hoarse. Green discharge from nose in am's esp. No perceived increased need for saba.  No obvious day to day or daytime variabilty or assoc chronic cough or cp or chest tightness, subjective wheeze overt sinus or hb symptoms. No unusual exp hx or h/o childhood pna/ asthma or knowledge of premature birth.  Sleeping ok without nocturnal   of respiratory  c/o's or need for noct saba. Also denies any obvious fluctuation of symptoms with weather or environmental changes or other aggravating or alleviating factors except as outlined above   Current Medications, Allergies, Complete Past  Medical History, Past Surgical History, Family History, and Social History were reviewed in [employer] record.     ROS  The following are not active complaints unless bolded sore throat, dysphagia, dental problems, itching, sneezing,  nasal congestion or excess/ purulent secretions, ear ache,   fever, chills, sweats, unintended wt loss, pleuritic or exertional cp, hemoptysis,  orthopnea pnd or leg swelling, presyncope, palpitations, heartburn, abdominal pain, anorexia, nausea, vomiting, diarrhea  or change in bowel or urinary habits, change in stools or urine, dysuria,hematuria,  rash, arthralgias, visual complaints, headache, numbness weakness or ataxia or problems with walking or coordination,  change in mood/affect or memory.     OBJ- Physical Exam General- Alert, Oriented, Affect-appropriate, Distress- none acute. obese Skin- rash-none, lesions- none, excoriation- none Lymphadenopathy- none Head- atraumatic            Eyes- Gross vision intact, PERRLA, conjunctivae and secretions clear            Ears- Hearing, canals-normal, TMs clear            Nose- Clear, no-Septal dev, mucus, polyps, erosion, perforation             Throat- Mallampati IV , mucosa clear , drainage-none, tonsils- atrophic, + hoarse Neck- flexible , trachea midline, no stridor , thyroid nl, carotid no bruit Chest - symmetrical excursion , unlabored           Heart/CV- RRR , no murmur , no gallop  , no rub, nl s1 s2                           - JVD- none , edema- none, stasis changes- none, varices- none           Lung- Clear, unlabored, wheeze+ end-expiratory, cough+ l  , dullness-none, rub- none            Abd-  Br/ Gen/ Rectal- Not done, not indicated Extrem- cyanosis- none, clubbing, none, atrophy- none, strength- nl Neuro- grossly intact to observation      Assessment:

## 2013-10-24 NOTE — Assessment & Plan Note (Addendum)
DDX of  difficult airways managment all start with A and  include Adherence, Ace Inhibitors, Acid Reflux, Active Sinus Disease, Alpha 1 Antitripsin deficiency, Anxiety masquerading as Airways dz,  ABPA,  allergy(esp in young), Aspiration (esp in elderly), Adverse effects of DPI,  Active smokers, plus two Bs  = Bronchiectasis and Beta blocker use..and one C= CHF  Adherence is always the initial "prime suspect" and is a multilayered concern that requires a "trust but verify" approach in every patient - starting with knowing how to use medications, especially inhalers, correctly, keeping up with refills and understanding the fundamental difference between maintenance and prns vs those medications only taken for a very short course and then stopped and not refilled.  - not using ICS approp - The proper method of use, as well as anticipated side effects, of a metered-dose inhaler are discussed and demonstrated to the patient. Improved effectiveness after extensive coaching during this visit to a level of approximately  75% so try symbicort 80 2bid  ? Active sinus dz > very stronlgy suspect this is the case > rec she take the augmentin already called in and f/u with Annalee Genta  ? Allergy > pred x 6 days   ? Acid (or non-acid) GERD > always difficult to exclude as up to 75% of pts in some series report no assoc GI/ Heartburn symptoms> rec max (24h)  acid suppression and diet restrictions/ reviewed and instructions given in writting    Each maintenance medication was reviewed in detail including most importantly the difference between maintenance and as needed and under what circumstances the prns are to be used.  Please see instructions for details which were reviewed in writing and the patient given a copy.

## 2013-10-24 NOTE — Patient Instructions (Addendum)
symbicort 80 Take 2 puffs first thing in am and then another 2 puffs about 12 hours later for any flare of resp symptoms  Prednisone 10 mg take  4 each am x 2 days,   2 each am x 2 days,  1 each am x 2 days and stop  When coughing you need  Try prilosec(omeprazole) 20mg   Take 30-60 min before first meal of the day and Ranitidine 150 mg one bedtime until cough is completely gone for at least a week without the need for cough suppression  I think of reflux for chronic cough like I do oxygen for fire (doesn't cause the fire but once you get the oxygen suppressed it usually goes away regardless of the exact cause).

## 2013-11-05 ENCOUNTER — Ambulatory Visit: Payer: BC Managed Care – PPO | Admitting: Internal Medicine

## 2013-11-06 ENCOUNTER — Encounter: Payer: Self-pay | Admitting: Internal Medicine

## 2013-11-06 ENCOUNTER — Ambulatory Visit (INDEPENDENT_AMBULATORY_CARE_PROVIDER_SITE_OTHER): Payer: BC Managed Care – PPO | Admitting: Internal Medicine

## 2013-11-06 DIAGNOSIS — R7309 Other abnormal glucose: Secondary | ICD-10-CM

## 2013-11-06 DIAGNOSIS — Z0000 Encounter for general adult medical examination without abnormal findings: Secondary | ICD-10-CM

## 2013-11-06 DIAGNOSIS — M549 Dorsalgia, unspecified: Secondary | ICD-10-CM | POA: Insufficient documentation

## 2013-11-06 DIAGNOSIS — I10 Essential (primary) hypertension: Secondary | ICD-10-CM

## 2013-11-06 DIAGNOSIS — F329 Major depressive disorder, single episode, unspecified: Secondary | ICD-10-CM

## 2013-11-06 DIAGNOSIS — R7302 Impaired glucose tolerance (oral): Secondary | ICD-10-CM

## 2013-11-06 DIAGNOSIS — E2609 Other primary hyperaldosteronism: Secondary | ICD-10-CM

## 2013-11-06 HISTORY — DX: Other primary hyperaldosteronism: E26.09

## 2013-11-06 MED ORDER — FLUTICASONE PROPIONATE 50 MCG/ACT NA SUSP: 2.0000 | Freq: Two times a day (BID) | NASAL | Status: DC

## 2013-11-06 MED ORDER — CYCLOBENZAPRINE HCL 5 MG PO TABS: 5.0000 mg | ORAL_TABLET | Freq: Three times a day (TID) | ORAL | Status: DC | PRN

## 2013-11-06 NOTE — Assessment & Plan Note (Signed)
With ? Hyperaldo, stable today, to f/u renal later this mo

## 2013-11-06 NOTE — Progress Notes (Signed)
Pre-visit discussion using our clinic review tool. No additional management support is needed unless otherwise documented below in the visit note.  

## 2013-11-06 NOTE — Assessment & Plan Note (Signed)
stable overall by history and exam, recent data reviewed with pt, and pt to continue medical treatment as before,  to f/u any worsening symptoms or concerns Lab Results  Component Value Date   WBC 8.1 05/06/2013   HGB 12.7 05/06/2013   HCT 38.8 05/06/2013   PLT 227.0 05/06/2013   GLUCOSE 86 05/06/2013   CHOL 105 05/06/2013   TRIG 84.0 05/06/2013   HDL 47.80 05/06/2013   LDLCALC 40 05/06/2013   ALT 13 05/06/2013   AST 15 05/06/2013   NA 139 05/06/2013   K 3.9 05/06/2013   CL 101 05/06/2013   CREATININE 0.7 05/06/2013   BUN 6 05/06/2013   CO2 33* 05/06/2013   TSH 1.07 05/06/2013   HGBA1C 5.8 05/06/2013

## 2013-11-06 NOTE — Patient Instructions (Addendum)
Please take all new medication as prescribed Please continue all other medications as before, and refills have been done if requested. Please have the pharmacy call with any other refills you may need.  Please continue your efforts at being more active, low cholesterol diet, and weight control.  Please remember to sign up for My Chart if you have not done so, as this will be important to you in the future with finding out test results, communicating by private email, and scheduling acute appointments online when needed.  Please keep your appointments with your specialists as you have planned - nephrology  Please return in 6 months, or sooner if needed, with Lab testing done 3-5 days before

## 2013-11-06 NOTE — Assessment & Plan Note (Signed)
C/w prob strain, for flexeril prn

## 2013-11-06 NOTE — Progress Notes (Signed)
Subjective:    Patient ID: Andrea Mason, female    DOB: 1951/05/20, 62 y.o.   MRN: [id number]  HPI  Here to f/u; overall doing ok,  Pt denies chest pain, increased sob or doe, wheezing, orthopnea, PND, increased LE swelling, palpitations, dizziness or syncope.  Pt denies polydipsia, polyuria.  Pt denies new neurological symptoms such as new headache, or facial or extremity weakness or numbness.   Pt states overall good compliance with meds, has been trying to follow lower cholesterol diet, with wt overall stable,  but little exercise however. Pt continues to have recurring left LBP without change in severity, bowel or bladder change, fever, wt loss,  worsening LE pain/numbness/weakness, gait change or falls. Asking for muscle relaxer.  See per renal with recent renin and aldost levels, now on spironolactone for hyperaldo. Denies worsening depressive symptoms, suicidal ideation, or panic Past Medical History  Diagnosis Date  . Hypertension   . GERD (gastroesophageal reflux disease)   . Allergic rhinitis   . Asthma   . Obesity   . Depression   . History of colonic polyps   . DJD (degenerative joint disease), cervical   . 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 Nasal sinus surgery  07/2006    Dr. Annalee Genta  . Nasal polyp surgery  07/2006    x 2 with Dr. Annalee Genta  . Foot surgery  1998  . Tubal ligation      reports that she has never smoked. She has never used smokeless tobacco. She reports that she does not drink alcohol or use illicit drugs. family history includes Colon cancer in her brother. Allergies  Allergen Reactions  . Ivp Dye [Iodinated Diagnostic Agents] Nausea And Vomiting    Reaction: hot flashes  . Promethazine-Codeine Nausea And Vomiting   Current Outpatient Prescriptions on File Prior to Visit  Medication Sig Dispense Refill  . albuterol (PROAIR HFA) 108 (90 BASE) MCG/ACT inhaler Inhale 2 puffs into the lungs every 6  (six) hours as needed for wheezing. 2 puffs every 4 hours as needed only  if your can't catch your breath  1 Inhaler  2  . Biotin 5000 MCG CAPS Take 1 capsule by mouth daily.      . budesonide-formoterol (SYMBICORT) 80-4.5 MCG/ACT inhaler Take 2 puffs first thing in am and then another 2 puffs about 12 hours later.  1 Inhaler  12  . cetirizine (ZYRTEC) 10 MG tablet Take 10 mg by mouth at bedtime.       Marland Kitchen guaiFENesin (MUCINEX) 600 MG 12 hr tablet 1-2 twice daily as needed      . montelukast (SINGULAIR) 10 MG tablet Take 1 tablet (10 mg total) by mouth at bedtime.  30 tablet  11  . omeprazole (PRILOSEC) 20 MG capsule Take 1 capsule (20 mg total) by mouth every morning.  30 capsule  11  . oxymetazoline (AFRIN) 0.05 % nasal spray Place 2 sprays into the nose 2 (two) times daily as needed. For 5 days      . Respiratory Therapy Supplies (FLUTTER) DEVI Use as needed for cough/congestion      . sodium chloride (OCEAN) 0.65 % nasal spray Place 2 sprays into the nose as needed for congestion.      Marland Kitchen spironolactone (ALDACTONE) 25 MG tablet Take 50 mg by mouth daily.      . traMADol (ULTRAM) 50 MG tablet Take 50 mg by mouth every 4 (four) hours as needed (for  severe cough).       No current facility-administered medications on file prior to visit.    Review of Systems  Constitutional: Negative for unexpected weight change, or unusual diaphoresis  HENT: Negative for tinnitus.   Eyes: Negative for photophobia and visual disturbance.  Respiratory: Negative for choking and stridor.   Gastrointestinal: Negative for vomiting and blood in stool.  Genitourinary: Negative for hematuria and decreased urine volume.  Musculoskeletal: Negative for acute joint swelling Skin: Negative for color change and wound.  Neurological: Negative for tremors and numbness other than noted  Psychiatric/Behavioral: Negative for decreased concentration or  hyperactivity.       Objective:   Physical Exam There were no vitals  taken for this visit. VS noted,  Constitutional: Pt appears well-developed and well-nourished.  HENT: Head: NCAT.  Right Ear: External ear normal.  Left Ear: External ear normal.  Eyes: Conjunctivae and EOM are normal. Pupils are equal, round, and reactive to light.  Neck: Normal range of motion. Neck supple.  Cardiovascular: Normal rate and regular rhythm.   Pulmonary/Chest: Effort normal and breath sounds normal.  Abd:  Soft, NT, non-distended, + BS \\spine  and paraspinal normal Neurological: Pt is alert. Not confused  Skin: Skin is warm. No erythema.  Psychiatric: Pt behavior is normal. Thought content normal. not depressed affect    Assessment & Plan:

## 2013-11-14 ENCOUNTER — Ambulatory Visit (INDEPENDENT_AMBULATORY_CARE_PROVIDER_SITE_OTHER): Payer: BC Managed Care – PPO | Admitting: Internal Medicine

## 2013-11-14 ENCOUNTER — Telehealth: Payer: Self-pay | Admitting: Internal Medicine

## 2013-11-14 ENCOUNTER — Encounter: Payer: Self-pay | Admitting: Internal Medicine

## 2013-11-14 ENCOUNTER — Ambulatory Visit: Payer: BC Managed Care – PPO | Admitting: Internal Medicine

## 2013-11-14 VITALS — BP 122/90 | HR 82 | Temp 97.7°F | Ht 61.75 in | Wt 219.0 lb

## 2013-11-14 DIAGNOSIS — R05 Cough: Secondary | ICD-10-CM

## 2013-11-14 DIAGNOSIS — J45909 Unspecified asthma, uncomplicated: Secondary | ICD-10-CM

## 2013-11-14 MED ORDER — AMOXICILLIN-POT CLAVULANATE 875-125 MG PO TABS: 1.0000 | ORAL_TABLET | Freq: Two times a day (BID) | ORAL | Status: DC

## 2013-11-14 MED ORDER — TRAMADOL HCL 50 MG PO TABS: ORAL_TABLET | ORAL | Status: DC

## 2013-11-14 MED ORDER — PREDNISONE (PAK) 10 MG PO TABS: ORAL_TABLET | ORAL | Status: DC

## 2013-11-14 MED ORDER — AZITHROMYCIN 250 MG PO TABS: ORAL_TABLET | ORAL | Status: DC

## 2013-11-14 NOTE — Patient Instructions (Addendum)
Prednisone 10 mg take  4 each am x 2 days,   2 each am x 2 days,  1 each am x 2 days and stop   Augmentin 875 mg take one pill twice daily  X 10 days - take at breakfast and supper with large glass of water.  It would help reduce the usual side effects (diarrhea and yeast infections) if you ate cultured yogurt at lunch.   For cough use the flutter valve and Take mucinex dm 1200 every  12 hours and supplement if needed with  tramadol 50 mg up to 2 every 4 hours to suppress the urge to cough. Swallowing water or using ice chips/non mint and menthol containing candies (such as lifesavers or sugarless jolly ranchers) are also effective.  You should rest your voice and avoid activities that you know make you cough.  Once you have eliminated the cough for 3 straight days try reducing the tramadol first,  then the mucinex dm  Stay on prilosec 20 mg Take 30-60 min before first meal of the day and zantac 150 mg at bedtime  GERD (REFLUX)  is an extremely common cause of respiratory symptoms, many times with no significant heartburn at all.    It can be treated with medication, but also with lifestyle changes including avoidance of late meals, excessive alcohol, smoking cessation, and avoid fatty foods, chocolate, peppermint, colas, red wine, and acidic juices such as orange juice.  NO MINT OR MENTHOL PRODUCTS SO NO COUGH DROPS  USE SUGARLESS CANDY INSTEAD (jolley ranchers or Stover's)  NO OIL BASED VITAMINS - use powdered substitutes.   See [name] w/in 4  weeks with all your medications, even over the counter meds, separated in two separate bags, the ones you take no matter what vs the ones you stop once you feel better and take only as needed when you feel you need them.   [name]  will generate for you a new user friendly medication calendar that will put [place] all on the same page re: your medication use.     Without this process, it simply isn't possible to assure that we are providing  your outpatient  care  with  the attention to detail we feel you deserve.   If we cannot assure that you're getting that kind of care,  then we cannot manage your problem effectively from this clinic.  Once you have seen [name] and we are sure that we're all on the same page with your medication use she will arrange follow up with me.

## 2013-11-14 NOTE — Progress Notes (Signed)
Subjective:     Patient ID: Andrea Mason, female   DOB: 03-Jul-1951  MRN: [id number] HPI  44 yobf never smoker followed by Dr Annamaria Boots for allergic rhinitis and asthma   02/05/2013 1st pulmonary eval in EPIC era baseline = completely better on qvar 80 2bid and only use rescue once a week at baseline then much worse starting 3/3 with cough/ congestion/ green mucus and rescue x one by time of ov at 2pm.  No resting sob, very hoarse with harsh barking cough >Augmentin rx   03/17/2013 Follow up and med calendar   no change rx, use med calendar   04/18/2013  ov/Andrea Mason acute w/in no calendar Chief Complaint  Patient presents with  . Acute Visit    Pt c/o wheezing for the past several wks, worse today after being exposed to fragrences. She also c/o cough since last night, non prod.    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 rec Prednisone 10 mg take  4 each am x 2 days,   2 each am x 2 days,  1 each am x2days and stop Only use your albuterol as a rescue    06/03/2013 f/u ov/Andrea Mason f/u asthma/ chronic rhinitis Chief Complaint  Patient presents with  . Follow-up    6 week follow up Complains of green mucous   abrupt cough onset x one week, not needing proaire, assoc with nasal congestion but minimal sob. Rec: augmentin/ pred  06/30/13- 86 yobf never smoker followed byDr Melvyn Novas for asthma/ Last saw Dr Annamaria Boots 2012-for allergic rhinitis and asthma  FOLLOW UP:  pt reports she has had a few episodes with sinus infections/URI requing rounds of abx-- would like to discuss restarting allergy vaccine per Dr. Melvyn Novas patient needs allegies more under control She was on allergy vaccine for one or 2 years, stopping 2 years ago-drifted off. Complains of frequent nasal congestion, green nasal discharge treated with prednisone and amoxicillin. Right eye waters. Some sneeze- perennial but worse in spring and fall pollen seasons. Using Zyrtec and Flonase. Denies headaches,  ear pressure or itching.  History of sinus surgery twice by Dr. [name]. CT head 2012-chronic sinus disease    08/21/13-62 yobf never smoker followed by Dr Melvyn Novas for asthma/  Dr Annamaria Boots [phone] allergic rhinitis and asthma  FOLLOWS FOR: Not on Allergy vaccine ; has not got Zpak that was called into pharmacy recently We reviewed skin testing from 2011. She had tried to start allergy vaccine but could not afford it and says that she cannot afford it now. Perennial rhinitis and asthma. Can't afford to fill recent prescription for Zithromax. When she takes Augmentin she says it loosens ropy mucus from her nose. Continues daily Zyrtec, Alvesco and uses rescue inhaler 2 or 3 days per week.  Has seen Dr Ayesha Mohair ENT/ [place] and Dr [name] ENT..  09/23/13-  32 yobf never smoker followed by Dr Melvyn Novas for asthma/  Dr Annamaria Boots for allergic rhinitis FOLLOWS FOR:has stopped vaccine; still working with insurance about  allergy coverage. Thinks she has a sinus infection. Sneezing and coughing with green from nose. Ears feel full and itch. She did not followup with Dr. Lovenia Shuck rec augmentin > did not take alvesco > ran out of samples and did not fill rx   10/24/2013 f/u ov/Andrea Mason re: cough > rhinitis > sob Chief Complaint  Patient presents with  . Acute Visit    Pt c/o sore throat, non prod  cough and PND x 1 wk.   also very hoarse. Green discharge from nose in am's esp. No perceived increased need for saba. rec symbicort 80 Take 2 puffs first thing in am and then another 2 puffs about 12 hours later for any flare of resp symptoms Prednisone 10 mg take  4 each am x 2 days,   2 each am x 2 days,  1 each am x 2 days and stop When coughing you need  Try prilosec(omeprazole) 20mg   Take 30-60 min before first meal of the day and Ranitidine 150 mg one bedtime until cough is completely gone for at least a week without the need for cough suppression   11/14/2013 f/u ov/Andrea Mason re:  Chief Complaint  Patient  presents with  . Acute Visit    Pt c/o cough x 5 days- prod with green sputum.   seeing [facility] for kidneys  Not using symbicort now     No obvious day to day or daytime variabilty or assoc chronic cough or cp or chest tightness, subjective wheeze overt sinus or hb symptoms. No unusual exp hx or h/o childhood pna/ asthma or knowledge of premature birth.  Sleeping ok without nocturnal   of respiratory  c/o's or need for noct saba. Also denies any obvious fluctuation of symptoms with weather or environmental changes or other aggravating or alleviating factors except as outlined above   Current Medications, Allergies, Complete Past Medical History, Past Surgical History, Family History, and Social History were reviewed in [employer] record.     ROS  The following are not active complaints unless bolded sore throat, dysphagia, dental problems, itching, sneezing,  nasal congestion or excess/ purulent secretions, ear ache,   fever, chills, sweats, unintended wt loss, pleuritic or exertional cp, hemoptysis,  orthopnea pnd or leg swelling, presyncope, palpitations, heartburn, abdominal pain, anorexia, nausea, vomiting, diarrhea  or change in bowel or urinary habits, change in stools or urine, dysuria,hematuria,  rash, arthralgias, visual complaints, headache, numbness weakness or ataxia or problems with walking or coordination,  change in mood/affect or memory.     OBJ- Physical Exam General- Alert, Oriented, Affect-appropriate, Distress- none acute. obese Skin- rash-none, lesions- none, excoriation- none Lymphadenopathy- none Head- atraumatic            Eyes- Gross vision intact, PERRLA, conjunctivae and secretions clear            Ears- Hearing, canals-normal, TMs clear            Nose- Clear, no-Septal dev, mucus, polyps, erosion, perforation             Throat- Mallampati IV , mucosa clear , drainage-none, tonsils- atrophic, + hoarse Neck- flexible , trachea midline, no  stridor , thyroid nl, carotid no bruit Chest - symmetrical excursion , unlabored           Heart/CV- RRR , no murmur , no gallop  , no rub, nl s1 s2                           - JVD- none , edema- none, stasis changes- none, varices- none           Lung- Clear, unlabored,  Clear of wheezing  dullness-none, rub- none            Abd-  Br/ Gen/ Rectal- Not done, not indicated Extrem- cyanosis- none, clubbing, none, atrophy- none, strength- nl Neuro- grossly intact to observation  Assessment:

## 2013-11-14 NOTE — Telephone Encounter (Signed)
Per MW pt is suppose to have augmentin. I advised will send this in. Pt is aware and I also cancelled wrong abx as well. Nothing further needed

## 2013-11-16 DIAGNOSIS — R05 Cough: Secondary | ICD-10-CM | POA: Insufficient documentation

## 2013-11-16 DIAGNOSIS — R059 Cough, unspecified: Secondary | ICD-10-CM | POA: Insufficient documentation

## 2013-11-16 NOTE — Assessment & Plan Note (Signed)
-  med calendar 03/17/2013 > not using 10/24/2013  - hfa 75% 10/24/2013     Each maintenance medication was reviewed in detail including most importantly the difference between maintenance and as needed and under what circumstances the prns are to be used.  Please see instructions for details which were reviewed in writing and the patient given a copy.

## 2013-11-16 NOTE — Assessment & Plan Note (Signed)
Severe s flare of asthma strongly suggests  Classic Upper airway cough syndrome, so named because it's frequently impossible to sort out how much is  CR/sinusitis with freq throat clearing (which can be related to primary GERD)   vs  causing  secondary (" extra esophageal")  GERD from wide swings in gastric pressure that occur with throat clearing, often  promoting self use of mint and menthol lozenges that reduce the lower esophageal sphincter tone and exacerbate the problem further in a cyclical fashion.   These are the same pts (now being labeled as having "irritable larynx syndrome" by some cough centers) who not infrequently have a history of having failed to tolerate ace inhibitors,  dry powder inhalers or biphosphonates or report having atypical reflux symptoms that don't respond to standard doses of PPI , and are easily confused as having aecopd or asthma flares by even experienced allergists/ pulmonologists.  For now rx empirically for sinus dz and cyclical coughing then regroup for med reconciliation.   To keep things simple, I have asked the patient to first separate medicines that are perceived as maintenance, that is to be taken daily "no matter what", from those medicines that are taken on only on an as-needed basis and I have given the patient examples of both, and then return to see our NP to generate a  detailed  medication calendar which should be followed until the next physician sees the patient and updates it.    See instructions for specific recommendations which were reviewed directly with the patient who was given a copy with highlighter outlining the key components.

## 2013-11-20 ENCOUNTER — Encounter (HOSPITAL_COMMUNITY): Payer: Self-pay | Admitting: Emergency Medicine

## 2013-11-20 ENCOUNTER — Inpatient Hospital Stay (HOSPITAL_COMMUNITY)
Admission: EM | Admit: 2013-11-20 | Discharge: 2013-11-25 | DRG: 065 | Disposition: A | Discharge status: Rehabilitation Facility | Payer: BC Managed Care – PPO | Attending: Internal Medicine | Admitting: Internal Medicine

## 2013-11-20 ENCOUNTER — Emergency Department (HOSPITAL_COMMUNITY): Payer: BC Managed Care – PPO

## 2013-11-20 DIAGNOSIS — D179 Benign lipomatous neoplasm, unspecified: Secondary | ICD-10-CM

## 2013-11-20 DIAGNOSIS — M129 Arthropathy, unspecified: Secondary | ICD-10-CM

## 2013-11-20 DIAGNOSIS — R4701 Aphasia: Secondary | ICD-10-CM | POA: Diagnosis present

## 2013-11-20 DIAGNOSIS — E669 Obesity, unspecified: Secondary | ICD-10-CM

## 2013-11-20 DIAGNOSIS — D259 Leiomyoma of uterus, unspecified: Secondary | ICD-10-CM

## 2013-11-20 DIAGNOSIS — J330 Polyp of nasal cavity: Secondary | ICD-10-CM

## 2013-11-20 DIAGNOSIS — F411 Generalized anxiety disorder: Secondary | ICD-10-CM | POA: Diagnosis present

## 2013-11-20 DIAGNOSIS — G4710 Hypersomnia, unspecified: Secondary | ICD-10-CM

## 2013-11-20 DIAGNOSIS — R609 Edema, unspecified: Secondary | ICD-10-CM

## 2013-11-20 DIAGNOSIS — M47812 Spondylosis without myelopathy or radiculopathy, cervical region: Secondary | ICD-10-CM | POA: Diagnosis present

## 2013-11-20 DIAGNOSIS — I6350 Cerebral infarction due to unspecified occlusion or stenosis of unspecified cerebral artery: Principal | ICD-10-CM | POA: Diagnosis present

## 2013-11-20 DIAGNOSIS — E2609 Other primary hyperaldosteronism: Secondary | ICD-10-CM

## 2013-11-20 DIAGNOSIS — Z0000 Encounter for general adult medical examination without abnormal findings: Secondary | ICD-10-CM

## 2013-11-20 DIAGNOSIS — F3289 Other specified depressive episodes: Secondary | ICD-10-CM

## 2013-11-20 DIAGNOSIS — R1310 Dysphagia, unspecified: Secondary | ICD-10-CM | POA: Diagnosis present

## 2013-11-20 DIAGNOSIS — E876 Hypokalemia: Secondary | ICD-10-CM

## 2013-11-20 DIAGNOSIS — R4182 Altered mental status, unspecified: Secondary | ICD-10-CM | POA: Diagnosis present

## 2013-11-20 DIAGNOSIS — J45909 Unspecified asthma, uncomplicated: Secondary | ICD-10-CM

## 2013-11-20 DIAGNOSIS — I639 Cerebral infarction, unspecified: Secondary | ICD-10-CM

## 2013-11-20 DIAGNOSIS — R05 Cough: Secondary | ICD-10-CM

## 2013-11-20 DIAGNOSIS — R059 Cough, unspecified: Secondary | ICD-10-CM

## 2013-11-20 DIAGNOSIS — F329 Major depressive disorder, single episode, unspecified: Secondary | ICD-10-CM | POA: Diagnosis present

## 2013-11-20 DIAGNOSIS — R29898 Other symptoms and signs involving the musculoskeletal system: Secondary | ICD-10-CM | POA: Diagnosis present

## 2013-11-20 DIAGNOSIS — M549 Dorsalgia, unspecified: Secondary | ICD-10-CM

## 2013-11-20 DIAGNOSIS — R930 Abnormal findings on diagnostic imaging of skull and head, not elsewhere classified: Secondary | ICD-10-CM

## 2013-11-20 DIAGNOSIS — Z800 Family history of malignant neoplasm of digestive organs: Secondary | ICD-10-CM

## 2013-11-20 DIAGNOSIS — R739 Hyperglycemia, unspecified: Secondary | ICD-10-CM

## 2013-11-20 DIAGNOSIS — J328 Other chronic sinusitis: Secondary | ICD-10-CM

## 2013-11-20 DIAGNOSIS — K219 Gastro-esophageal reflux disease without esophagitis: Secondary | ICD-10-CM

## 2013-11-20 DIAGNOSIS — E269 Hyperaldosteronism, unspecified: Secondary | ICD-10-CM | POA: Diagnosis present

## 2013-11-20 DIAGNOSIS — R55 Syncope and collapse: Secondary | ICD-10-CM

## 2013-11-20 DIAGNOSIS — G8190 Hemiplegia, unspecified affecting unspecified side: Secondary | ICD-10-CM | POA: Diagnosis present

## 2013-11-20 DIAGNOSIS — I10 Essential (primary) hypertension: Secondary | ICD-10-CM

## 2013-11-20 DIAGNOSIS — Z860100 Personal history of colon polyps, unspecified: Secondary | ICD-10-CM

## 2013-11-20 DIAGNOSIS — J302 Other seasonal allergic rhinitis: Secondary | ICD-10-CM

## 2013-11-20 DIAGNOSIS — M79609 Pain in unspecified limb: Secondary | ICD-10-CM

## 2013-11-20 DIAGNOSIS — J019 Acute sinusitis, unspecified: Secondary | ICD-10-CM | POA: Diagnosis present

## 2013-11-20 DIAGNOSIS — J4530 Mild persistent asthma, uncomplicated: Secondary | ICD-10-CM | POA: Diagnosis present

## 2013-11-20 DIAGNOSIS — E785 Hyperlipidemia, unspecified: Secondary | ICD-10-CM | POA: Diagnosis present

## 2013-11-20 DIAGNOSIS — M479 Spondylosis, unspecified: Secondary | ICD-10-CM

## 2013-11-20 DIAGNOSIS — Z8673 Personal history of transient ischemic attack (TIA), and cerebral infarction without residual deficits: Secondary | ICD-10-CM

## 2013-11-20 DIAGNOSIS — J309 Allergic rhinitis, unspecified: Secondary | ICD-10-CM | POA: Diagnosis present

## 2013-11-20 LAB — CBC WITH DIFFERENTIAL/PLATELET
Basophils Relative: 1 % (ref 0–1)
Eosinophils Absolute: 0.3 10*3/uL (ref 0.0–0.7)
Eosinophils Relative: 4 % (ref 0–5)
Hemoglobin: 12.9 g/dL (ref 12.0–15.0)
Lymphocytes Relative: 32 % (ref 12–46)
MCH: 29.3 pg (ref 26.0–34.0)
MCV: 86.8 fL (ref 78.0–100.0)
Monocytes Relative: 7 % (ref 3–12)
Neutro Abs: 4.3 10*3/uL (ref 1.7–7.7)
Neutrophils Relative %: 56 % (ref 43–77)
RBC: 4.4 MIL/uL (ref 3.87–5.11)
WBC: 7.7 10*3/uL (ref 4.0–10.5)

## 2013-11-20 LAB — COMPREHENSIVE METABOLIC PANEL
ALT: 11 U/L (ref 0–35)
AST: 16 U/L (ref 0–37)
Albumin: 3.5 g/dL (ref 3.5–5.2)
Alkaline Phosphatase: 118 U/L — ABNORMAL HIGH (ref 39–117)
BUN: 9 mg/dL (ref 6–23)
CO2: 28 mEq/L (ref 19–32)
Creatinine, Ser: 0.83 mg/dL (ref 0.50–1.10)
GFR calc Af Amer: 87 mL/min — ABNORMAL LOW (ref >90)
GFR calc non Af Amer: 75 mL/min — ABNORMAL LOW (ref >90)
Glucose, Bld: 144 mg/dL — ABNORMAL HIGH (ref 70–99)
Potassium: 3.1 mEq/L — ABNORMAL LOW (ref 3.5–5.1)
Sodium: 136 mEq/L (ref 135–145)
Total Bilirubin: 0.6 mg/dL (ref 0.3–1.2)
Total Protein: 7.5 g/dL (ref 6.0–8.3)

## 2013-11-20 LAB — URINALYSIS, ROUTINE W REFLEX MICROSCOPIC
Glucose, UA: NEGATIVE mg/dL
Ketones, ur: NEGATIVE mg/dL
Leukocytes, UA: NEGATIVE
Nitrite: NEGATIVE
Protein, ur: NEGATIVE mg/dL
Specific Gravity, Urine: 1.005 (ref 1.005–1.030)
Urobilinogen, UA: 0.2 mg/dL (ref 0.0–1.0)

## 2013-11-20 LAB — POCT I-STAT TROPONIN I: Troponin i, poc: 0.01 ng/mL (ref 0.00–0.08)

## 2013-11-20 LAB — RAPID URINE DRUG SCREEN, HOSP PERFORMED
Amphetamines: NOT DETECTED
Benzodiazepines: NOT DETECTED
Opiates: NOT DETECTED

## 2013-11-20 MED ORDER — PANTOPRAZOLE SODIUM 40 MG PO TBEC
40.0000 mg | DELAYED_RELEASE_TABLET | Freq: Every day | ORAL | Status: DC
  Administered 2013-11-21: 40 mg via ORAL
  Filled 2013-11-20 (×2): qty 1

## 2013-11-20 MED ORDER — ACETAMINOPHEN 650 MG RE SUPP: 650.0000 mg | Freq: Four times a day (QID) | RECTAL | Status: DC | PRN

## 2013-11-20 MED ORDER — SODIUM CHLORIDE 0.9 % IV BOLUS (SEPSIS)
1000.0000 mL | Freq: Once | INTRAVENOUS | Status: AC
  Administered 2013-11-20: 1000 mL via INTRAVENOUS

## 2013-11-20 MED ORDER — ENOXAPARIN SODIUM 40 MG/0.4ML ~~LOC~~ SOLN
40.0000 mg | SUBCUTANEOUS | Status: DC
  Administered 2013-11-20 – 2013-11-24 (×5): 40 mg via SUBCUTANEOUS
  Filled 2013-11-20 (×6): qty 0.4

## 2013-11-20 MED ORDER — SPIRONOLACTONE 50 MG PO TABS
50.0000 mg | ORAL_TABLET | Freq: Every day | ORAL | Status: DC
  Administered 2013-11-21: 50 mg via ORAL
  Filled 2013-11-20 (×2): qty 1

## 2013-11-20 MED ORDER — SODIUM CHLORIDE 0.9 % IJ SOLN
3.0000 mL | Freq: Two times a day (BID) | INTRAMUSCULAR | Status: DC
  Administered 2013-11-23 – 2013-11-25 (×3): 3 mL via INTRAVENOUS

## 2013-11-20 MED ORDER — ONDANSETRON HCL 4 MG/2ML IJ SOLN: 4.0000 mg | Freq: Four times a day (QID) | INTRAMUSCULAR | Status: DC | PRN

## 2013-11-20 MED ORDER — FLUTICASONE PROPIONATE 50 MCG/ACT NA SUSP
2.0000 | Freq: Two times a day (BID) | NASAL | Status: DC
  Administered 2013-11-20 – 2013-11-25 (×9): 2 via NASAL
  Filled 2013-11-20: qty 16

## 2013-11-20 MED ORDER — GUAIFENESIN ER 600 MG PO TB12
600.0000 mg | ORAL_TABLET | Freq: Two times a day (BID) | ORAL | Status: DC
  Administered 2013-11-20 – 2013-11-21 (×3): 600 mg via ORAL
  Filled 2013-11-20 (×5): qty 1

## 2013-11-20 MED ORDER — CYCLOBENZAPRINE HCL 5 MG PO TABS
5.0000 mg | ORAL_TABLET | Freq: Three times a day (TID) | ORAL | Status: DC | PRN
  Filled 2013-11-20: qty 1

## 2013-11-20 MED ORDER — HYDRALAZINE HCL 20 MG/ML IJ SOLN: 10.0000 mg | INTRAMUSCULAR | Status: DC | PRN

## 2013-11-20 MED ORDER — LORATADINE 10 MG PO TABS
10.0000 mg | ORAL_TABLET | Freq: Every day | ORAL | Status: DC
  Administered 2013-11-20 – 2013-11-21 (×2): 10 mg via ORAL
  Filled 2013-11-20 (×3): qty 1

## 2013-11-20 MED ORDER — BUDESONIDE-FORMOTEROL FUMARATE 80-4.5 MCG/ACT IN AERO
2.0000 | INHALATION_SPRAY | Freq: Two times a day (BID) | RESPIRATORY_TRACT | Status: DC
  Administered 2013-11-20 – 2013-11-25 (×10): 2 via RESPIRATORY_TRACT
  Filled 2013-11-20: qty 6.9

## 2013-11-20 MED ORDER — ACETAMINOPHEN 325 MG PO TABS
650.0000 mg | ORAL_TABLET | Freq: Four times a day (QID) | ORAL | Status: DC | PRN
  Administered 2013-11-24 (×2): 650 mg via ORAL
  Filled 2013-11-20 (×2): qty 2

## 2013-11-20 MED ORDER — MONTELUKAST SODIUM 10 MG PO TABS
10.0000 mg | ORAL_TABLET | Freq: Every day | ORAL | Status: DC
  Administered 2013-11-20 – 2013-11-21 (×2): 10 mg via ORAL
  Filled 2013-11-20 (×3): qty 1

## 2013-11-20 MED ORDER — SODIUM CHLORIDE 0.9 % IV SOLN
INTRAVENOUS | Status: DC
  Administered 2013-11-20 – 2013-11-22 (×2): via INTRAVENOUS

## 2013-11-20 MED ORDER — ALBUTEROL SULFATE HFA 108 (90 BASE) MCG/ACT IN AERS: 2.0000 | INHALATION_SPRAY | Freq: Four times a day (QID) | RESPIRATORY_TRACT | Status: DC | PRN

## 2013-11-20 MED ORDER — AMOXICILLIN-POT CLAVULANATE 875-125 MG PO TABS
1.0000 | ORAL_TABLET | Freq: Two times a day (BID) | ORAL | Status: DC
  Administered 2013-11-20 – 2013-11-21 (×3): 1 via ORAL
  Filled 2013-11-20 (×5): qty 1

## 2013-11-20 MED ORDER — POTASSIUM CHLORIDE CRYS ER 20 MEQ PO TBCR
40.0000 meq | EXTENDED_RELEASE_TABLET | Freq: Once | ORAL | Status: AC
  Administered 2013-11-20: 40 meq via ORAL
  Filled 2013-11-20: qty 2

## 2013-11-20 MED ORDER — ONDANSETRON HCL 4 MG PO TABS: 4.0000 mg | ORAL_TABLET | Freq: Four times a day (QID) | ORAL | Status: DC | PRN

## 2013-11-20 NOTE — ED Notes (Signed)
Pt assisted to bedside commode to urinate x5 over time in ED.

## 2013-11-20 NOTE — ED Notes (Signed)
EDP at bedside to assess pt for possible syncopy episode, and assisted fall. Dr Silverio Lay md

## 2013-11-20 NOTE — ED Provider Notes (Signed)
CSN: [id number]     Arrival date & time 11/20/13  1555 History   First MD Initiated Contact with Patient 11/20/13 1607     Chief Complaint  Patient presents with  . Altered Mental Status   (Consider location/radiation/quality/duration/timing/severity/associated sxs/prior Treatment) The history is provided by the patient.  Andrea Mason is a 62 y.o. female history of hypertension, reflux, asthma, syncope here presenting with altered mental status. Today she was at home and was getting ready to go to a doctor's appointment. Around 3:00 she suddenly had an episode of confusion and felt that her left arm was shaking. States that she was out about 10 minutes and then still feel confused. He felt that her left arm didn't belong to her. Denies any tongue biting or urinary incontinence. She states that she may have passed out during this time. She had a history of syncope in the past and had a workup that didn't reveal anything. She also had a similar episode 5 days ago.    Past Medical History  Diagnosis Date  . Hypertension   . GERD (gastroesophageal reflux disease)   . Allergic rhinitis   . Asthma   . Obesity   . Depression   . History of colonic polyps   . DJD (degenerative joint disease), cervical   . 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 Nasal sinus surgery  07/2006    Dr. Annalee Genta  . Nasal polyp surgery  07/2006    x 2 with Dr. Annalee Genta  . Foot surgery  1998  . Tubal ligation     Family History  Problem Relation Age of Onset  . Colon cancer Brother    History  Substance Use Topics  . Smoking status: Never Smoker   . Smokeless tobacco: Never Used  . Alcohol Use: No   OB History   Grav Para Term Preterm Abortions TAB SAB Ect Mult Living                 Review of Systems  Neurological: Positive for syncope.  All other systems reviewed and are negative.    Allergies  Ivp dye and Promethazine-codeine  Home  Medications   Current Outpatient Rx  Name  Route  Sig  Dispense  Refill  . albuterol (PROAIR HFA) 108 (90 BASE) MCG/ACT inhaler   Inhalation   Inhale 2 puffs into the lungs every 6 (six) hours as needed for wheezing. 2 puffs every 4 hours as needed only  if your can't catch your breath   1 Inhaler   2   . amoxicillin-clavulanate (AUGMENTIN) 875-125 MG per tablet   Oral   Take 1 tablet by mouth 2 (two) times daily.   20 tablet   0   . Biotin 5000 MCG CAPS   Oral   Take 1 capsule by mouth daily.         . budesonide-formoterol (SYMBICORT) 80-4.5 MCG/ACT inhaler      Take 2 puffs first thing in am and then another 2 puffs about 12 hours later.   1 Inhaler   12   . cetirizine (ZYRTEC) 10 MG tablet   Oral   Take 10 mg by mouth at bedtime.          . cyclobenzaprine (FLEXERIL) 5 MG tablet   Oral   Take 1 tablet (5 mg total) by mouth 3 (three) times daily as needed for muscle spasms.   60 tablet   1   .  fluticasone (FLONASE) 50 MCG/ACT nasal spray   Each Nare   Place 2 sprays into both nostrils 2 (two) times daily.   16 g   11   . guaiFENesin (MUCINEX) 600 MG 12 hr tablet      1-2 twice daily as needed         . montelukast (SINGULAIR) 10 MG tablet   Oral   Take 1 tablet (10 mg total) by mouth at bedtime.   30 tablet   11   . omeprazole (PRILOSEC) 20 MG capsule   Oral   Take 1 capsule (20 mg total) by mouth every morning.   30 capsule   11   . oxymetazoline (AFRIN) 0.05 % nasal spray   Nasal   Place 2 sprays into the nose 2 (two) times daily as needed. For 5 days         . predniSONE (STERAPRED UNI-PAK) 10 MG tablet      Prednisone 10 mg take  4 each am x 2 days,   2 each am x 2 days,  1 each am x2days and stop   14 tablet   0   . Respiratory Therapy Supplies (FLUTTER) DEVI      Use as needed for cough/congestion         . sodium chloride (OCEAN) 0.65 % nasal spray   Nasal   Place 2 sprays into the nose as needed for congestion.          Marland Kitchen spironolactone (ALDACTONE) 25 MG tablet   Oral   Take 50 mg by mouth daily.         . traMADol (ULTRAM) 50 MG tablet      1-2 every 4 hours as needed for cough or pain   40 tablet   0    BP 159/90  Pulse 74  Resp 11  SpO2 97% Physical Exam  Nursing note and vitals reviewed. Constitutional: She is oriented to person, place, and time. She appears well-nourished.  Slow to respond, A&O x 3.   HENT:  Head: Normocephalic.  Mouth/Throat: Oropharynx is clear and moist.  Eyes: Conjunctivae are normal. Pupils are equal, round, and reactive to light.  Neck: Normal range of motion. Neck supple.  Cardiovascular: Normal rate, regular rhythm and normal heart sounds.   Pulmonary/Chest: Effort normal and breath sounds normal. No respiratory distress. She has no wheezes. She has no rales.  Abdominal: Soft. Bowel sounds are normal. She exhibits no distension. There is no tenderness. There is no rebound and no guarding.  Musculoskeletal: Normal range of motion. She exhibits no edema and no tenderness.  Neurological: She is alert and oriented to person, place, and time.  Nl strength and sensation   Skin: Skin is warm and dry.  Psychiatric: She has a normal mood and affect. Her behavior is normal. Judgment and thought content normal.    ED Course  Procedures (including critical care time) Labs Review Labs Reviewed  COMPREHENSIVE METABOLIC PANEL - Abnormal; Notable for the following:    Potassium 3.1 (*)    Glucose, Bld 144 (*)    Alkaline Phosphatase 118 (*)    GFR calc non Af Amer 75 (*)    GFR calc Af Amer 87 (*)    All other components within normal limits  CBC WITH DIFFERENTIAL  URINALYSIS, ROUTINE W REFLEX MICROSCOPIC  URINE RAPID DRUG SCREEN (HOSP PERFORMED)  POCT I-STAT TROPONIN I   Imaging Review Ct Head Wo Contrast  11/20/2013   CLINICAL DATA:  Altered mental status; syncope  EXAM: CT HEAD WITHOUT CONTRAST  TECHNIQUE: Contiguous axial images were obtained from the base  of the skull through the vertex without intravenous contrast. Study was obtained within 24 hr of patient's arrival at the emergency department.  COMPARISON:  October 09, 2011  FINDINGS: The ventricles are normal in size and configuration. There is no mass, hemorrhage, extra-axial fluid collection, or midline shift. Gray-white compartments are normal. There is no demonstrable acute infarct.  Bony calvarium appears intact. The mastoid air cells are clear. There is diffuse opacification of all paranasal sinuses.  IMPRESSION: Pansinusitis, extensive.  Study otherwise unremarkable.   Electronically Signed   By: Bretta Bang M.D.   On: 11/20/2013 18:27    EKG Interpretation    Date/Time:  Thursday November 20 2013 16:28:02 EST Ventricular Rate:  71 PR Interval:  174 QRS Duration: 86 QT Interval:  382 QTC Calculation: 415 R Axis:   7 Text Interpretation:  Sinus rhythm Left ventricular hypertrophy No significant change since last tracing Confirmed by Jen Benedict  MD, Jarrod Mcenery [phone]) on 11/20/2013 4:30:31 PM            MDM  No diagnosis found. Andrea Mason is a 62 y.o. female here with possible syncope vs seizure. No hx of seizure so don't need to start any antiepileptics. Will get CT head and labs. She has previous syncopal episodes in the past with negative workup so doesn't need another workup.    7:30 PM Patient passed out twice in the ED. No seizure activity. Slightly confused but no post ictal behavior. Patient didn't fall but was caught by staff. Not orthostatic. Will admit for further workup.    Richardean Canal, MD 11/20/13 [phone]

## 2013-11-20 NOTE — H&P (Signed)
[facility] History and Physical  Qamar Rosman JXB:[id number] DOB: [birth year] DOA: 11/20/2013  Referring physician: ER physician. PCP: Oliver Barre, MD   Chief Complaint: Loss of consciousness.  HPI: Andrea Mason is a 62 y.o. female history of asthma, allergic sinusitis and recently being worked up for hypokalemia and patient was placed on spironolactone ( probably for hyperaldosteronism by nephrologist and have no access to nephrologist records) was brought to the ER after patient had a syncopal episode. As per the patient on 5 PM patient was getting ready to go out when patient felt something funny in her left hand and later on patient thinks she may have passed out. She did not have any tongue bite or incontinence of urine. After the incident she walked out and went to her neighbors and eventually patient was brought to the ER. CT head did not show any acute. EKG did not show any acute. Patient in the ER had 2 episodes of syncope which lasted for less than a minute. Both the time patient was trying to walk. Patient during those episodes did not have any seizure-like activities or tongue bite or incontinence of urine. On my exam patient presently is alert awake oriented to her name and place and time but looks confused and patient's family also states that she was confused. Patient denies any headache chest pain palpitation shortness of breath nausea vomiting abdominal pain diarrhea fever chills. Patient states that of recently over the last one week she has stopped taking tramadol because it was making her feel uneasy. Patient was recently placed on Augmentin and prednisone for sinusitis.  Review of Systems: As presented in the history of presenting illness, rest negative.  Past Medical History  Diagnosis Date  . Hypertension   . GERD (gastroesophageal reflux disease)   . Allergic rhinitis   . Asthma   . Obesity   . Depression   . History of colonic polyps   . DJD (degenerative  joint disease), cervical   . 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 Nasal sinus surgery  07/2006    Dr. Annalee Genta  . Nasal polyp surgery  07/2006    x 2 with Dr. Annalee Genta  . Foot surgery  1998  . Tubal ligation     Social History:  reports that she has never smoked. She has never used smokeless tobacco. She reports that she does not drink alcohol or use illicit drugs. Where does patient live home. Can patient participate in ADLs? Yes.  Allergies  Allergen Reactions  . Ivp Dye [Iodinated Diagnostic Agents] Nausea And Vomiting    Reaction: hot flashes  . Promethazine-Codeine Nausea And Vomiting    Family History:  Family History  Problem Relation Age of Onset  . Colon cancer Brother       Prior to Admission medications   Medication Sig Start Date End Date Taking? Authorizing Provider  budesonide-formoterol (SYMBICORT) 80-4.5 MCG/ACT inhaler Take 2 puffs first thing in am and then another 2 puffs about 12 hours later. 10/24/13  Yes Nyoka Cowden, MD  cetirizine (ZYRTEC) 10 MG tablet Take 10 mg by mouth at bedtime.    Yes Historical Provider, MD  fluticasone (FLONASE) 50 MCG/ACT nasal spray Place 2 sprays into both nostrils 2 (two) times daily. 11/06/13  Yes Corwin Levins, MD  montelukast (SINGULAIR) 10 MG tablet Take 1 tablet (10 mg total) by mouth at bedtime. 08/21/13  Yes Waymon Budge, MD  predniSONE (STERAPRED UNI-PAK) 10  MG tablet Prednisone 10 mg take  4 each am x 2 days,   2 each am x 2 days,  1 each am x2days and stop 11/14/13  Yes Nyoka Cowden, MD  albuterol (PROAIR HFA) 108 (90 BASE) MCG/ACT inhaler Inhale 2 puffs into the lungs every 6 (six) hours as needed for wheezing. 2 puffs every 4 hours as needed only  if your can't catch your breath 04/18/13   Nyoka Cowden, MD  amoxicillin-clavulanate (AUGMENTIN) 875-125 MG per tablet Take 1 tablet by mouth 2 (two) times daily. 11/14/13   Nyoka Cowden, MD  Biotin 5000 MCG  CAPS Take 1 capsule by mouth daily.    Historical Provider, MD  cyclobenzaprine (FLEXERIL) 5 MG tablet Take 1 tablet (5 mg total) by mouth 3 (three) times daily as needed for muscle spasms. 11/06/13   Corwin Levins, MD  guaiFENesin (MUCINEX) 600 MG 12 hr tablet 1-2 twice daily as needed    Historical Provider, MD  omeprazole (PRILOSEC) 20 MG capsule Take 1 capsule (20 mg total) by mouth every morning. 10/24/13   Nyoka Cowden, MD  oxymetazoline (AFRIN) 0.05 % nasal spray Place 2 sprays into the nose 2 (two) times daily as needed. For 5 days    Historical Provider, MD  Respiratory Therapy Supplies (FLUTTER) DEVI Use as needed for cough/congestion    Historical Provider, MD  sodium chloride (OCEAN) 0.65 % nasal spray Place 2 sprays into the nose as needed for congestion.    Historical Provider, MD  spironolactone (ALDACTONE) 25 MG tablet Take 50 mg by mouth daily.    Historical Provider, MD  traMADol Janean Sark) 50 MG tablet 1-2 every 4 hours as needed for cough or pain 11/14/13   Nyoka Cowden, MD    Physical Exam: Filed Vitals:   11/20/13 1915 11/20/13 1930 11/20/13 1945 11/20/13 2000  BP: 174/152 148/84 157/115 161/143  Pulse: 70 71 87 75  Resp: 22 19 15 25   SpO2: 99% 99% 95% 98%     General:  Well developed and nourished.  Eyes: Anicteric no pallor.  ENT: No discharge from ears eyes nose mouth.  Neck: No mass felt. No JVD appreciated.  Cardiovascular: S1-S2 heard.  Respiratory: No rhonchi or crepitations.  Abdomen: Soft nontender bowel sounds present.  Skin: No rash.  Musculoskeletal: No edema.  Psychiatric: Appears normal.  Neurologic: Alert awake oriented to time place and person. Moves all extremities 5 x 5. No facial asymmetry. Tongue is midline.  Labs on Admission:  Basic Metabolic Panel:  Recent Labs Lab 11/20/13 1641  NA 136  K 3.1*  CL 98  CO2 28  GLUCOSE 144*  BUN 9  CREATININE 0.83  CALCIUM 9.0   Liver Function Tests:  Recent Labs Lab  11/20/13 1641  AST 16  ALT 11  ALKPHOS 118*  BILITOT 0.6  PROT 7.5  ALBUMIN 3.5   No results found for this basename: LIPASE, AMYLASE,  in the last 168 hours No results found for this basename: AMMONIA,  in the last 168 hours CBC:  Recent Labs Lab 11/20/13 1641  WBC 7.7  NEUTROABS 4.3  HGB 12.9  HCT 38.2  MCV 86.8  PLT 251   Cardiac Enzymes: No results found for this basename: CKTOTAL, CKMB, CKMBINDEX, TROPONINI,  in the last 168 hours  BNP (last 3 results) No results found for this basename: PROBNP,  in the last 8760 hours CBG: No results found for this basename: GLUCAP,  in the last 168  hours  Radiological Exams on Admission: Ct Head Wo Contrast  11/20/2013   CLINICAL DATA:  Altered mental status; syncope  EXAM: CT HEAD WITHOUT CONTRAST  TECHNIQUE: Contiguous axial images were obtained from the base of the skull through the vertex without intravenous contrast. Study was obtained within 24 hr of patient's arrival at the emergency department.  COMPARISON:  October 09, 2011  FINDINGS: The ventricles are normal in size and configuration. There is no mass, hemorrhage, extra-axial fluid collection, or midline shift. Gray-white compartments are normal. There is no demonstrable acute infarct.  Bony calvarium appears intact. The mastoid air cells are clear. There is diffuse opacification of all paranasal sinuses.  IMPRESSION: Pansinusitis, extensive.  Study otherwise unremarkable.   Electronically Signed   By: Bretta Bang M.D.   On: 11/20/2013 18:27    EKG: Independently reviewed. Normal sinus rhythm. QTC 450 ms.  Assessment/Plan Principal Problem:   Syncope Active Problems:   HYPERTENSION   RHINOSINUSITIS, CHRONIC   Intrinsic asthma, unspecified   Hypokalemia   1. Syncope - cause not clear. ER physician stated patient was not orthostatic. At this time patient looks confused and family also states she is little confused. I have discussed with on-call neurologist Dr.  Cyril Mourning. Among the differentials postictal state is one. For which I will discontinue tramadol. Get MRI brain with contrast and EEG. Continue to monitor in telemetry for any arrhythmias. I have ordered 2-D echo. Check orthostatics. Since patient was recently on prednisone we will check cortisol levels. 2. Hypokalemia - most likely from primary hyperaldosteronism. I have requested records from patient's nephrologist. Continue spironolactone for now. Replace potassium and recheck. Check magnesium. 3. Sinusitis - continue Augmentin. 4. Asthma - patient is not wheezing. Continue inhalers.  I have reviewed patient's chart. Independently reviewed the EKG and compare old labs.    Code Status: Full code.  Family Communication: Patient's family at the bedside.  Disposition Plan: Admit for observation.    Liesa Tsan N. [facility] Pager [phone].  If 7PM-7AM, please contact night-coverage [email or web address] Password [facility] 11/20/2013, 8:39 PM

## 2013-11-20 NOTE — ED Notes (Signed)
Pt reports taking tramadol and feeling like she "was going to passout" pt reports not taking tramadol again since event. Pt reports at some point today felt pain in her left hand and "felt like it was not my/pt own hand." Pt states hand feels normal at present time. Pt a/ox4 but responds slow to questions and eyes seems gazed.

## 2013-11-20 NOTE — Progress Notes (Signed)
Called ED to get report. On hold for 7 minutes. Called back for nurse to call me back when ready. Awaiting call. Fraser Din, RN

## 2013-11-20 NOTE — ED Notes (Signed)
Assisted pt to floor, in bathroom, no loss of consciousness

## 2013-11-20 NOTE — ED Notes (Signed)
Per EMS pt from home in a 55 or older community. Pt came out of her apartment and stated she needed help when neighbors passed. Pt confused on EMS arrival. Pt reports taking tramadol; taken on Saturday with confusion; EMS unaware if pt took today. Pt able to follow commands but slow with response.

## 2013-11-21 ENCOUNTER — Observation Stay (HOSPITAL_COMMUNITY)
Admit: 2013-11-21 | Discharge: 2013-11-21 | Disposition: A | Discharge status: Home / Self Care | Payer: BC Managed Care – PPO | Attending: Internal Medicine | Admitting: Internal Medicine

## 2013-11-21 ENCOUNTER — Observation Stay (HOSPITAL_COMMUNITY): Payer: BC Managed Care – PPO

## 2013-11-21 DIAGNOSIS — I059 Rheumatic mitral valve disease, unspecified: Secondary | ICD-10-CM

## 2013-11-21 DIAGNOSIS — I639 Cerebral infarction, unspecified: Secondary | ICD-10-CM | POA: Diagnosis present

## 2013-11-21 LAB — CBC WITH DIFFERENTIAL/PLATELET
Basophils Absolute: 0 10*3/uL (ref 0.0–0.1)
Basophils Relative: 0 % (ref 0–1)
Eosinophils Relative: 2 % (ref 0–5)
HCT: 40.2 % (ref 36.0–46.0)
Lymphocytes Relative: 33 % (ref 12–46)
Lymphs Abs: 2.6 10*3/uL (ref 0.7–4.0)
MCH: 29.9 pg (ref 26.0–34.0)
MCV: 87 fL (ref 78.0–100.0)
Monocytes Absolute: 0.7 10*3/uL (ref 0.1–1.0)
Neutro Abs: 4.5 10*3/uL (ref 1.7–7.7)
RBC: 4.62 MIL/uL (ref 3.87–5.11)
RDW: 13.5 % (ref 11.5–15.5)
WBC: 7.9 10*3/uL (ref 4.0–10.5)

## 2013-11-21 LAB — COMPREHENSIVE METABOLIC PANEL
ALT: 11 U/L (ref 0–35)
Albumin: 3.8 g/dL (ref 3.5–5.2)
BUN: 7 mg/dL (ref 6–23)
CO2: 26 mEq/L (ref 19–32)
Calcium: 9.4 mg/dL (ref 8.4–10.5)
Chloride: 101 mEq/L (ref 96–112)
Creatinine, Ser: 0.77 mg/dL (ref 0.50–1.10)
Potassium: 3.9 mEq/L (ref 3.5–5.1)
Total Bilirubin: 1.1 mg/dL (ref 0.3–1.2)
Total Protein: 7.9 g/dL (ref 6.0–8.3)

## 2013-11-21 LAB — MAGNESIUM: Magnesium: 2.1 mg/dL (ref 1.5–2.5)

## 2013-11-21 LAB — CORTISOL: Cortisol, Plasma: 5.8 ug/dL

## 2013-11-21 MED ORDER — LISINOPRIL 5 MG PO TABS
5.0000 mg | ORAL_TABLET | Freq: Every day | ORAL | Status: DC
  Administered 2013-11-21: 21:00:00 5 mg via ORAL
  Filled 2013-11-21 (×2): qty 1

## 2013-11-21 MED ORDER — ASPIRIN EC 325 MG PO TBEC
325.0000 mg | DELAYED_RELEASE_TABLET | Freq: Every day | ORAL | Status: DC
  Administered 2013-11-21: 21:00:00 325 mg via ORAL
  Filled 2013-11-21 (×2): qty 1

## 2013-11-21 NOTE — Progress Notes (Signed)
Echo Lab  2D Echocardiogram completed.  Doni Widmer L Anacleto Batterman, RDCS 11/21/2013 2:48 PM

## 2013-11-21 NOTE — Progress Notes (Signed)
UR completed 

## 2013-11-21 NOTE — Progress Notes (Signed)
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  -  Continue telemetry:  NSR -  MRA head -  Carotid duplex -  ECHO with mild LVH, EF of 50-55%, no PFO identified -  Start Aspirin 325mg  daily -  Lipid panel -  Hemoglobin A1c -  PT/OT/SLP  -  Will discuss case with Neurology in the morning -  EEG demonstrated slowing in the left temporal region without epileptiform activity, likely related to recent stroke  Acute sinusitis, improving.  Continue augmentin  Hypokalemia, resolved with supplementation. Likely related to hyperaldosteronemia -  Continue spironolactone -  Add lisinopril -  Carefully monitor potassium  Asthma, stable, continue albuterol prn  HTN, blood pressure elevated -  Add lisinopril, may help with chronic hypokalemia also  Fall, slid to floor and hit bottom but did not hit head.  Family and RN were in room when incident happened -  No pain of limbs or pelvic instability  Diet:  Healthy heart Access:  PIV  IVF:  off Proph:  lovenox  Code Status: full Family Communication: patient and her daughter-in-law Disposition Plan: pending further stroke work up as above  Consultants:  Neurology by phone  Procedures:  EEG  MRI brain  Antibiotics:  augmentin 12/18 >>   HPI/Subjective:  Denies any problems, difficulty understanding questions and following commands, denies that anything is wrong  Objective: Filed Vitals:   11/20/13 2255 11/21/13 0700 11/21/13 1500 11/21/13 1945  BP: 164/124 151/88 150/82   Pulse: 89 82 82   Temp:  98.9 F (37.2 C) 98 F (36.7 C)   TempSrc:  Oral Oral   Resp: 26 22 22    Height:      Weight:      SpO2: 100% 100% 100% 100%    Intake/Output Summary  (Last 24 hours) at 11/21/13 1954 Last data filed at 11/21/13 1926  Gross per 24 hour  Intake 443.67 ml  Output   1650 ml  Net -1206.33 ml   Filed Weights   11/20/13 2115  Weight: 96.6 kg (212 lb 15.4 oz)    Exam:   General:  AAF, No acute distress  HEENT:  NCAT, MMM  Cardiovascular:  RRR, nl S1, S2 no mrg, 2+ pulses, warm extremities  Respiratory:  CTAB, no increased WOB  Abdomen:   NABS, soft, NT/ND  MSK:   Normal tone and bulk, no LEE  Neuro:  Difficulty moving right hand, following commands  Data Reviewed: Basic Metabolic Panel:  Recent Labs Lab 11/20/13 1641 11/21/13 0830  NA 136 138  K 3.1* 3.9  CL 98 101  CO2 28 26  GLUCOSE 144* 80  BUN 9 7  CREATININE 0.83 0.77  CALCIUM 9.0 9.4  MG  --  2.1   Liver Function Tests:  Recent Labs Lab 11/20/13 1641 11/21/13 0830  AST 16 12  ALT 11 11  ALKPHOS 118* 124*  BILITOT 0.6 1.1  PROT 7.5 7.9  ALBUMIN 3.5 3.8   No results found for this basename: LIPASE, AMYLASE,  in the last 168 hours No results found for this basename: AMMONIA,  in the last 168 hours CBC:  Recent Labs Lab 11/20/13 1641 11/21/13 0830  WBC 7.7 7.9  NEUTROABS 4.3 4.5  HGB 12.9 13.8  HCT 38.2 40.2  MCV 86.8 87.0  PLT 251 248   Cardiac Enzymes: No results found for this basename: CKTOTAL, CKMB, CKMBINDEX, TROPONINI,  in the last 168 hours BNP (last 3 results) No results found for this basename: PROBNP,  in the last 8760 hours CBG: No results found for this basename: GLUCAP,  in the last 168 hours  No results found for this or any previous visit (from the past 240 hour(s)).   Studies: Ct Head Wo Contrast  11/20/2013   CLINICAL DATA:  Altered mental status; syncope  EXAM: CT HEAD WITHOUT CONTRAST  TECHNIQUE: Contiguous axial images were obtained from the base of the skull through the vertex without intravenous contrast. Study was obtained within 24 hr of patient's arrival at the emergency department.  COMPARISON:  October 09, 2011  FINDINGS: The ventricles are normal in size and configuration. There is no mass, hemorrhage, extra-axial fluid collection, or midline shift. Gray-white compartments are normal. There is no demonstrable acute infarct.  Bony calvarium appears intact. The mastoid air cells are clear. There is diffuse opacification of all paranasal sinuses.  IMPRESSION: Pansinusitis, extensive.  Study otherwise unremarkable.   Electronically Signed   By: Andrea Mason M.D.   On: 11/20/2013 18:27   Mr Brain Wo Contrast  11/21/2013   CLINICAL DATA:  Seizure.  History of colon cancer.  EXAM: MRI HEAD WITHOUT CONTRAST  TECHNIQUE: Multiplanar, multiecho pulse sequences of the brain and surrounding structures were obtained without intravenous contrast.  COMPARISON:  CT head 11/20/2013  FINDINGS: Sagittal T1 and diffusion-weighted imaging was performed. The patient was unable to hold still and tolerate further imaging.  Acute infarct in the left posterior insula and left parietal lobe consistent with MCA infarction. Small area of acute infarct in the head of the caudate on the left.  Ventricle size is normal.  No midline shift.  Extensive mucosal edema throughout the paranasal sinuses as noted on CT.  IMPRESSION: Acute infarct left MCA territory. This involves the left posterior insula and parietal lobe. Small area of acute infarct in the head of the caudate on the left.   Electronically Signed   By: Andrea Mason M.D.   On: 11/21/2013 13:52    Scheduled Meds: . amoxicillin-clavulanate  1 tablet Oral BID  . aspirin EC  325 mg Oral Daily  . budesonide-formoterol  2 puff Inhalation BID  . enoxaparin (LOVENOX) injection  40 mg Subcutaneous Q24H  . fluticasone  2 spray Each Nare BID  . guaiFENesin  600 mg Oral BID  . loratadine  10 mg Oral Daily  . montelukast  10 mg Oral QHS  . pantoprazole  40 mg Oral Daily  . sodium chloride  3 mL Intravenous Q12H  . spironolactone  50 mg Oral Daily   Continuous Infusions: .  sodium chloride 20 mL/hr at 11/20/13 2149    Principal Problem:   Syncope Active Problems:   HYPERTENSION   RHINOSINUSITIS, CHRONIC   Intrinsic asthma, unspecified   Hypokalemia    Time spent: 30 min    Andrea Mason, [facility]  [facility] Pager [phone]. If 7PM-7AM, please contact night-coverage at [email or web address], password [facility] 11/21/2013, 7:54 PM  LOS: 1 day

## 2013-11-21 NOTE — Procedures (Signed)
ELECTROENCEPHALOGRAM REPORT   Patient: Andrea Mason       Room #: ZO1096 EEG No. ID: [id number] Age: 62 y.o.        Sex: female Referring Physician: Short Report Date:  11/21/2013        Interpreting Physician: Thana Farr D  History: Andrea Mason is an 62 y.o. female with syncope evaluated to rule out seizure  Medications:  Scheduled: . amoxicillin-clavulanate  1 tablet Oral BID  . budesonide-formoterol  2 puff Inhalation BID  . enoxaparin (LOVENOX) injection  40 mg Subcutaneous Q24H  . fluticasone  2 spray Each Nare BID  . guaiFENesin  600 mg Oral BID  . loratadine  10 mg Oral Daily  . montelukast  10 mg Oral QHS  . pantoprazole  40 mg Oral Daily  . sodium chloride  3 mL Intravenous Q12H  . spironolactone  50 mg Oral Daily    Conditions of Recording:  This is a 16 channel EEG carried out with the patient in the awake state.  Description:  The waking background activity consists of a low voltage, fairly well organized, 10-11 Hz alpha activity, seen from the parieto-occipital and posterior temporal regions.  Low voltage fast activity, poorly organized, is seen anteriorly and is at times superimposed on more posterior regions.  A mixture of theta and alpha rhythms are seen from the central and temporal regions.  The posterior background rhythm is less well sustained over the left hemisphere.  There is also noted an underlying polymorphic delta rhythm that is seen from the left hemisphere and is most prominent in the temporal region.  It should be noted that during the tracing there were issues with the left temporal region electrodes.   The patient does not drowse or sleep. Hyperventilation and intermittent photic stimulation were not performed.   IMPRESSION: There appears to be slowing over the left hemisphere that is persistent during the tracing and most prominent in the temporal region.  Although this may be a genuine finding there were also lead integrity issues during the  tracing which may explain this finding.  Clinical correlation recommended.  No epileptiform activity is noted.     Thana Farr, MD [facility] [phone] 11/21/2013, 2:19 PM

## 2013-11-21 NOTE — Progress Notes (Signed)
EEG Completed; Results Pending  

## 2013-11-21 NOTE — Progress Notes (Signed)
NT and RN at bedside assisting patient to BR.  NT assisted patient to sitting position, RN was moving BSC to bedside, then NT was moving steady into place to assist with pt mobility. During time period of staff preparing to get pt OOB pt slide out of bed and onto the floor.  RN was able to protect pt head. No bruises, bumps, or skin tears noted. Pt did not hit head.  Family members at bedside at time of fall.   4 staff members to assist pt back to bed.  Pt stating she still needs to use the bathroom and is refusing to use bedpan.  Education provided to pt and family about needing to provide pt safety and using a bedpan at this time is highly encouraged.  Both pt and family refusing to have pt use bedpan.  Pt and family refusing safety recommendations.

## 2013-11-22 ENCOUNTER — Inpatient Hospital Stay (HOSPITAL_COMMUNITY): Payer: BC Managed Care – PPO

## 2013-11-22 DIAGNOSIS — I10 Essential (primary) hypertension: Secondary | ICD-10-CM

## 2013-11-22 LAB — CBC WITH DIFFERENTIAL/PLATELET
Basophils Absolute: 0 10*3/uL (ref 0.0–0.1)
Basophils Relative: 0 % (ref 0–1)
Eosinophils Absolute: 0.1 10*3/uL (ref 0.0–0.7)
HCT: 36.6 % (ref 36.0–46.0)
Hemoglobin: 12.5 g/dL (ref 12.0–15.0)
MCH: 29.2 pg (ref 26.0–34.0)
MCHC: 34.2 g/dL (ref 30.0–36.0)
Monocytes Absolute: 0.8 10*3/uL (ref 0.1–1.0)
Monocytes Relative: 11 % (ref 3–12)
Neutro Abs: 5 10*3/uL (ref 1.7–7.7)
Neutrophils Relative %: 65 % (ref 43–77)
RDW: 13.3 % (ref 11.5–15.5)
WBC: 7.7 10*3/uL (ref 4.0–10.5)

## 2013-11-22 LAB — LIPID PANEL
Cholesterol: 80 mg/dL (ref 0–200)
LDL Cholesterol: 23 mg/dL (ref 0–99)
Total CHOL/HDL Ratio: 1.7 RATIO
Triglycerides: 53 mg/dL (ref <150)
VLDL: 11 mg/dL (ref 0–40)

## 2013-11-22 LAB — HEMOGLOBIN A1C
Hgb A1c MFr Bld: 6.1 % — ABNORMAL HIGH (ref <5.7)
Mean Plasma Glucose: 128 mg/dL — ABNORMAL HIGH (ref <117)

## 2013-11-22 LAB — APTT: aPTT: 25 seconds (ref 24–37)

## 2013-11-22 LAB — BASIC METABOLIC PANEL
BUN: 9 mg/dL (ref 6–23)
Calcium: 8.3 mg/dL — ABNORMAL LOW (ref 8.4–10.5)
Chloride: 102 mEq/L (ref 96–112)
Creatinine, Ser: 0.7 mg/dL (ref 0.50–1.10)
GFR calc Af Amer: >90 mL/min (ref >90)
GFR calc non Af Amer: >90 mL/min (ref >90)
Glucose, Bld: 99 mg/dL (ref 70–99)
Potassium: 3.2 mEq/L — ABNORMAL LOW (ref 3.5–5.1)
Sodium: 136 mEq/L (ref 135–145)

## 2013-11-22 LAB — PROTIME-INR: INR: 1.04 (ref 0.00–1.49)

## 2013-11-22 LAB — TROPONIN I: Troponin I: <0.3 ng/mL (ref <0.30)

## 2013-11-22 MED ORDER — PANTOPRAZOLE SODIUM 40 MG IV SOLR
40.0000 mg | INTRAVENOUS | Status: DC
  Administered 2013-11-22 – 2013-11-24 (×3): 40 mg via INTRAVENOUS
  Filled 2013-11-22 (×4): qty 40

## 2013-11-22 MED ORDER — POTASSIUM CHLORIDE CRYS ER 20 MEQ PO TBCR
40.0000 meq | EXTENDED_RELEASE_TABLET | Freq: Once | ORAL | Status: DC
  Filled 2013-11-22: qty 2

## 2013-11-22 MED ORDER — KCL IN DEXTROSE-NACL 40-5-0.45 MEQ/L-%-% IV SOLN
INTRAVENOUS | Status: DC
  Administered 2013-11-22: 15:00:00 via INTRAVENOUS
  Administered 2013-11-23: 16:00:00 75 mL/h via INTRAVENOUS
  Administered 2013-11-24: 11:00:00 via INTRAVENOUS
  Filled 2013-11-22 (×4): qty 1000

## 2013-11-22 MED ORDER — ASPIRIN 300 MG RE SUPP
300.0000 mg | Freq: Every day | RECTAL | Status: DC
  Administered 2013-11-23: 300 mg via RECTAL
  Filled 2013-11-22 (×2): qty 1

## 2013-11-22 MED ORDER — DEXTROSE 5 % IV SOLN
1.0000 g | INTRAVENOUS | Status: DC
  Administered 2013-11-22 – 2013-11-23 (×2): 1 g via INTRAVENOUS
  Filled 2013-11-22 (×2): qty 10

## 2013-11-22 MED ORDER — LORAZEPAM 2 MG/ML IJ SOLN
2.0000 mg | Freq: Once | INTRAMUSCULAR | Status: AC
  Administered 2013-11-22: 2 mg via INTRAVENOUS
  Filled 2013-11-22: qty 1

## 2013-11-22 NOTE — Progress Notes (Signed)
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  -  Continue telemetry:  NSR -  MRA:  High grade stenosis or occlusion of the inferior posterior left M2 branch corresponding with the area of acute infarction -  Carotid duplex:  < 40% stenosis bilaterall, antegrade vertebral artery flow -  ECHO with mild LVH, EF of 50-55%, no PFO identified -  Start Aspirin 325mg  daily -  Lipid panel at goal -  Hemoglobin A1c 6.1 -  PT/OT/SLP pending -  Appreciate neurology assistance -  EEG demonstrated slowing in the left temporal region without epileptiform activity, likely related to recent stroke  Acute sinusitis, improving.   -  Change to ceftriaxone while NPO  Hypokalemia, resolved with supplementation. Likely related to hyperaldosteronemia -  Change to IV supplementation while NPO  Asthma, stable, continue albuterol prn  HTN, blood pressure elevated -  Continue prn hydralazine  Fall, slid to floor and hit bottom but did not hit head.  Family and RN were in room when incident happened -  No pain of limbs or pelvic instability  Diet:  Healthy heart Access:  PIV  IVF:  off Proph:  lovenox  Code Status: full Family Communication: patient and her daughter-in-law Disposition Plan: pending further stroke work up as above  Consultants:  Neurology by phone  Procedures:  EEG  MRI brain  Antibiotics:  augmentin 12/18 >> 12/20  Ceftriaxone 12/20 >>   HPI/Subjective:  Denies any problems, difficulty understanding questions and following commands, denies that anything is wrong  Objective: Filed Vitals:   11/22/13 0345 11/22/13 0400 11/22/13 0700 11/22/13 1431  BP: 145/92 149/87 136/92 127/90  Pulse: 86 86 93  87  Temp:   98 F (36.7 C) 97.6 F (36.4 C)  TempSrc:   Oral Oral  Resp: 20 24 20 20   Height:      Weight:      SpO2: 99% 98% 99% 100%    Intake/Output Summary (Last 24 hours) at 11/22/13 1649 Last data filed at 11/22/13 1506  Gross per 24 hour  Intake 1105.67 ml  Output    975 ml  Net 130.67 ml   Filed Weights   11/20/13 2115  Weight: 96.6 kg (212 lb 15.4 oz)    Exam:   General:  AAF, No acute distress  HEENT:  NCAT, MMM  Cardiovascular:  RRR, nl S1, S2 no mrg, 2+ pulses, warm extremities  Respiratory:  CTAB, no increased WOB  Abdomen:   NABS, soft, NT/ND  MSK:   Normal tone and bulk, no LEE  Neuro:  4/5 right hand and right leg strength with some possible neglect of the right side.  Tends to lean to the right side in bed.  Possibly diminished sensation of the right hand and leg also.  Still confused and having difficulty following commands.    Data Reviewed: Basic Metabolic Panel:  Recent Labs Lab 11/20/13 1641 11/21/13 0830 11/22/13 0545  NA 136 138 136  K 3.1* 3.9 3.2*  CL 98 101 102  CO2 28 26 21   GLUCOSE 144* 80 99  BUN 9 7 9   CREATININE 0.83 0.77 0.70  CALCIUM 9.0 9.4 8.3*  MG  --  2.1  --    Liver Function Tests:  Recent Labs Lab 11/20/13 1641 11/21/13 0830  AST 16 12  ALT 11 11  ALKPHOS 118* 124*  BILITOT 0.6 1.1  PROT 7.5 7.9  ALBUMIN 3.5 3.8   No results found for this basename: LIPASE, AMYLASE,  in the last 168 hours No results found for this basename: AMMONIA,  in the last 168 hours CBC:  Recent Labs Lab 11/20/13 1641 11/21/13 0830 11/22/13 0545  WBC 7.7 7.9 7.7  NEUTROABS 4.3 4.5 5.0  HGB 12.9 13.8 12.5  HCT 38.2 40.2 36.6  MCV 86.8 87.0 85.5  PLT 251 248 234   Cardiac Enzymes:  Recent Labs Lab 11/22/13 0545  TROPONINI <0.30   BNP (last 3 results) No results found for this basename: PROBNP,  in the last 8760 hours CBG:  Recent Labs Lab 11/22/13 0326  GLUCAP 89    No results found for this or any  previous visit (from the past 240 hour(s)).   Studies: Ct Head Wo Contrast  11/20/2013   CLINICAL DATA:  Altered mental status; syncope  EXAM: CT HEAD WITHOUT CONTRAST  TECHNIQUE: Contiguous axial images were obtained from the base of the skull through the vertex without intravenous contrast. Study was obtained within 24 hr of patient's arrival at the emergency department.  COMPARISON:  October 09, 2011  FINDINGS: The ventricles are normal in size and configuration. There is no mass, hemorrhage, extra-axial fluid collection, or midline shift. Gray-white compartments are normal. There is no demonstrable acute infarct.  Bony calvarium appears intact. The mastoid air cells are clear. There is diffuse opacification of all paranasal sinuses.  IMPRESSION: Pansinusitis, extensive.  Study otherwise unremarkable.   Electronically Signed   By: Bretta Bang M.D.   On: 11/20/2013 18:27   Mr Maxine Glenn Head Wo Contrast  11/22/2013   CLINICAL DATA:  Right-sided weakness.  Acute cerebral infarct.  EXAM: MRA HEAD WITHOUT CONTRAST  TECHNIQUE: Angiographic images of the Circle of Willis were obtained using MRA technique without intravenous contrast.  COMPARISON:  MRI brain 11/21/2013  FINDINGS: The internal carotid arteries demonstrate slight irregularity through the cavernous segments. A tiny inferior outpouching in the left posterior communicating artery likely represents a small infundibulum. A follow-up exam at 1 year is recommended to assure stability and lack of growth.  There is mild narrowing of the A1 segments bilaterally. The right M1 segment is normal. There is some narrowing of the left M1 segment without a focal stenosis. A high-grade stenosis or focal occlusion is present proximally in the inferior posterior left M2 branch corresponding to the area of acute infarction. There is asymmetric attenuation of the more distal anterior superior left M2 branch. Minimal distal branch vessel disease is present on the right.   The vertebral arteries are codominant. The right PICA origin is visualized and normal. The left AICA is dominant. The basilar artery is within normal limits. Both posterior cerebral arteries originate from the basilar tip. There is mild attenuation of distal PCA branch vessels.  IMPRESSION: 1. High-grade stenosis or likely occlusion of the inferior posterior left M2 branch corresponding to the area of acute infarction. 2. Asymmetric distal small vessel disease in the more anterior and superior left M2 branch. 3. Mild atherosclerotic irregularity within the cavernous carotid arteries and A1 segments mild laterally. 4. Mild distal small vessel disease otherwise. 5. Tiny inferior outpouching at the left posterior communicating artery likely represents a small infundibulum. A follow-up exam at 1 year is recommended to assure stability and lack of growth.   Electronically  Signed   By: Gennette Pac M.D.   On: 11/22/2013 11:57   Mr Brain Wo Contrast  11/21/2013   CLINICAL DATA:  Seizure.  History of colon cancer.  EXAM: MRI HEAD WITHOUT CONTRAST  TECHNIQUE: Multiplanar, multiecho pulse sequences of the brain and surrounding structures were obtained without intravenous contrast.  COMPARISON:  CT head 11/20/2013  FINDINGS: Sagittal T1 and diffusion-weighted imaging was performed. The patient was unable to hold still and tolerate further imaging.  Acute infarct in the left posterior insula and left parietal lobe consistent with MCA infarction. Small area of acute infarct in the head of the caudate on the left.  Ventricle size is normal.  No midline shift.  Extensive mucosal edema throughout the paranasal sinuses as noted on CT.  IMPRESSION: Acute infarct left MCA territory. This involves the left posterior insula and parietal lobe. Small area of acute infarct in the head of the caudate on the left.   Electronically Signed   By: Marlan Palau M.D.   On: 11/21/2013 13:52    Scheduled Meds: . aspirin  300 mg Rectal  Daily  . budesonide-formoterol  2 puff Inhalation BID  . cefTRIAXone (ROCEPHIN)  IV  1 g Intravenous Q24H  . enoxaparin (LOVENOX) injection  40 mg Subcutaneous Q24H  . fluticasone  2 spray Each Nare BID  . pantoprazole (PROTONIX) IV  40 mg Intravenous Q24H  . sodium chloride  3 mL Intravenous Q12H   Continuous Infusions: . dextrose 5 % and 0.45 % NaCl with KCl 40 mEq/L 75 mL/hr at 11/22/13 1501    Principal Problem:   Stroke Active Problems:   HYPERTENSION   RHINOSINUSITIS, CHRONIC   Intrinsic asthma, unspecified   Hypokalemia   Syncope    Time spent: 30 min    Marionette Meskill, [facility]  [facility] Pager [phone]. If 7PM-7AM, please contact night-coverage at [email or web address], password [facility] 11/22/2013, 4:49 PM  LOS: 2 days

## 2013-11-22 NOTE — Progress Notes (Signed)
*  PRELIMINARY RESULTS* Vascular Ultrasound Carotid Duplex (Doppler) has been completed.   Findings suggest 1-39% internal carotid artery stenosis bilaterally. Vertebral arteries are patent with antegrade flow.  11/22/2013 8:37 AM Gertie Fey, RVT, RDCS, RDMS

## 2013-11-22 NOTE — Evaluation (Signed)
Physical Therapy Evaluation Patient Details Name: Andrea Mason MRN: [id number] DOB: [birth year] Today's Date: 11/22/2013 Time: 4098-1191 PT Time Calculation (min): 55 min  PT Assessment / Plan / Recommendation History of Present Illness  Andrea Mason is an 62 y.o. female with a past medical history significant for HTN, obesity, depression, DJD, colonic polyps, primary hyperaldosteronism, admitted to [facility] due to confusion and syncope.  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 This involves the left posterior insula and parietal lobe. Small area of acute infarct in the head of the caudate on the left.   Clinical Impression  Pt presents with CVA and r sided weakness (UE and LE), sensation and cognitive deficits. To benefit from skilled PT to continue to increase awareness, safety, strength , sensation, mobility  And pt/family education in order to eventually return pt to her home.     PT Assessment  Patient needs continued PT services    Follow Up Recommendations  CIR    Does the patient have the potential to tolerate intense rehabilitation      Barriers to Discharge        Equipment Recommendations       Recommendations for Other Services Rehab consult   Frequency Min 4X/week    Precautions / Restrictions     Pertinent Vitals/Pain No c/o pain     Mobility  Bed Mobility Bed Mobility: Supine to Sit;Sit to Supine Supine to Sit: 1: +2 Total assist Supine to Sit: Patient Percentage: 40% Sit to Supine: 1: +2 Total assist Sit to Supine: Patient Percentage: 10% Details for Bed Mobility Assistance: pt required assistance and verbal and tactile cues for progressing LEs and UEs off bed, did not utilize RUE for bed mobility. With rolling pt over shoots and ends up on stomach , however able to help alot especially to left and needs more asistance to R Transfers Transfers: Not assessed Details for Transfer Assistance: not assessed today  because sitting edge of bed an assessing UEs nd LEs was very taxing on her and after 10 or so minutes pt began to get non verbal and starry eyes again, and had to lay her back down total assist. I shared this with the nurse.     Exercises     PT Diagnosis: Abnormality of gait;Hemiplegia dominant side  PT Problem List: Decreased strength;Decreased activity tolerance;Decreased balance;Decreased mobility;Decreased coordination;Decreased cognition;Decreased knowledge of use of DME;Decreased safety awareness PT Treatment Interventions: DME instruction;Gait training;Functional mobility training;Therapeutic activities;Therapeutic exercise;Balance training;Neuromuscular re-education;Patient/family education     PT Goals(Current goals can be found in the care plan section) Acute Rehab PT Goals Patient Stated Goal: I want to eat, bring me some food. (educated patient and family about why pt is currently NPO and the SLP will continue to assess her safety  and her ability to swallow.  PT Goal Formulation: With patient/family Time For Goal Achievement: 12/06/13  Visit Information  Last PT Received On: 11/22/13 Assistance Needed: +2 History of Present Illness: Andrea Mason is an 62 y.o. female with a past medical history significant for HTN, obesity, depression, DJD, colonic polyps, primary hyperaldosteronism, admitted to [facility] due to confusion and syncope.  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 This involves the left posterior insula and parietal lobe. Small area of acute infarct in the head of the caudate on the left.        Prior Functioning  Home Living Family/patient expects to be  discharged to:: Private residence Living Arrangements: Other relatives Available Help at Discharge: Family Type of Home: House Home Access: Level entry Home Layout: One level Home Equipment: None Prior Function Level of Independence:  Independent Communication Communication: No difficulties    Cognition  Cognition Arousal/Alertness: Awake/alert (for beginning of session, however after 20-30 minutes of session, pt begin to fatigue and drift away. ) Behavior During Therapy: Impulsive Overall Cognitive Status: Impaired/Different from baseline Area of Impairment: Safety/judgement;Awareness;Problem solving;Orientation Orientation Level: Disoriented to;Place;Time;Situation Memory:  (unable to assess) Safety/Judgement: Decreased awareness of safety;Decreased awareness of deficits    Extremity/Trunk Assessment Lower Extremity Assessment Lower Extremity Assessment: RLE deficits/detail RLE Deficits / Details: in synergistic pattern with extenion on RLE , no isolated moevment patterns and 0/5 ankle DF and PF. Some knee ext with hip extension RLE Sensation: decreased light touch;decreased proprioception RLE Coordination: decreased fine motor;decreased gross motor   Balance Balance Balance Assessed: Yes Static Sitting Balance Static Sitting - Balance Support: Feet supported;Left upper extremity supported Static Sitting - Level of Assistance: 4: Min assist;Other (comment) (at times Mod a if she was trying to use the RLE becasue she would go into trunk extension to attempt to move RLE (synergistic pattern)) Static Sitting - Comment/# of Minutes: Dynamic Sitting Balance Dynamic Sitting - Balance Support: Feet supported;Left upper extremity supported Dynamic Sitting - Level of Assistance: 3: Mod assist;2: Max assist  End of Session PT - End of Session Activity Tolerance: Patient limited by fatigue;Treatment limited secondary to medical complications (Comment) Patient left: in bed;with family/visitor present Nurse Communication: Mobility status (utilize bedpan, not ready for tranfers to [facility] a this time)  GP     Romond Pipkins 11/22/2013, 5:02 PM Marella Bile, PT Pager: [phone] 11/22/2013

## 2013-11-22 NOTE — Progress Notes (Signed)
Called to room 1443 per [facility]  RN at [phone]. Pt potential for code stroke. Upon my arrival at 0320, operator called to notify of "code stroke". Per floor Rn pt up to bedside commode. While sitting on toilet pt had severe weakness on right side and garbled speech. This was an acute change from her baseline this admit. Pt original admitted for r/o seizures, dx with infarct per MRI 12/19 this admit. Pt found resting in bed. EKG obtained and lab called to draw per protocol labs. NS bolus started per protocol. CBG obtained yielding 89. Second IV attempted, IV team called pt has history of difficulty starting IVs and labs.  NP Lenny Pastel called to bedside to assess pt. Pt awake, confused, impulsive, follows some commands, but unable to smile or frown. Squeezes both hands with much prompting, weakness noted on right arm and right foot. Per floor RN this weakness is closer to baseline than when pt on commode. Pupils 2 equal reactive brisk, pt denies pain. Neurologist paged per operator. Call back received at 0340. Dr. Cyril Mourning updated on pt current change and rapid return to baseline. Per MD no interventions needed at this time, pt already positive for stroke on MRI 12/19, no need for another scan tonight, pt not a candidate for stroke interventions per Neurologist. See flowsheet for VS. Pt left resting in bed, remains confused but VSS and denies pain.

## 2013-11-22 NOTE — Progress Notes (Signed)
SLP Cancellation Note  Patient Details Name: Andrea Mason MRN: [id number] DOB: 1951/10/01   Cancelled treatment:        Pt is currently unable to maintain alertness to participate in swallow or speech/language evaluation.  Pt has multiple family members in room, who report word retrieval deficits (verbal paraphasias) and slurred speech.  Prior to admit, pt tolerated regular diet and was fully independent. Recommend continuing NPO status until pt is consistently awake and able to participate.  Andrea Mason B. Murvin Natal [facility], CCC-SLP [phone]  Andrea Mason 11/22/2013, 12:50 PM

## 2013-11-22 NOTE — Consult Note (Signed)
NEURO HOSPITALIST CONSULT NOTE    Reason for Consult: stroke: right hemiparesis and aphasia  HPI:                                                                                                                                          Andrea Mason is an 62 y.o. female with a past medical history significant for HTN, obesity, depression, DJD, colonic polyps, primary hyperaldosteronism, admitted to [facility] due to confusion and syncope.  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 This involves the left posterior  insula and parietal lobe. Small area of acute infarct in the head of the caudate on the left. Last night she was noted to have increasing right sided weakness and confusion that lasted for several minutes. Received a 500 mg NSS bolus as SBP was <140. Andrea Mason said that she " just became confused and something was not right in her mind, I couldn't use my right hand when I tried to comb my hair and it thought that the hand was not my hand".   Past Medical History  Diagnosis Date  . Hypertension   . GERD (gastroesophageal reflux disease)   . Allergic rhinitis   . Asthma   . Obesity   . Depression   . History of colonic polyps   . DJD (degenerative joint disease), cervical   . 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 Nasal sinus surgery  07/2006    Dr. Annalee Genta  . Nasal polyp surgery  07/2006    x 2 with Dr. Annalee Genta  . Foot surgery  1998  . Tubal ligation      Family History  Problem Relation Age of Onset  . Colon cancer Brother     Social History:  reports that she has never smoked. She has never used smokeless tobacco. She reports that she does not drink alcohol or use illicit drugs.  Allergies  Allergen Reactions  . Ivp Dye [Iodinated Diagnostic Agents] Nausea And Vomiting    Reaction: hot flashes  . Promethazine-Codeine Nausea And Vomiting     MEDICATIONS:  I have reviewed the patient's current medications.   ROS:                                                                                                                                       History obtained from the patient, daughter and chart review.  General ROS: negative for - chills, fatigue, fever, night sweats, weight gain or weight loss Psychological ROS: negative for - behavioral disorder, hallucinations, memory difficulties, mood swings or suicidal ideation Ophthalmic ROS: negative for - blurry vision, double vision, eye pain or loss of vision ENT ROS: negative for - epistaxis, nasal discharge, oral lesions, sore throat, tinnitus or vertigo Allergy and Immunology ROS: negative for - hives or itchy/watery eyes Hematological and Lymphatic ROS: negative for - bleeding problems, bruising or swollen lymph nodes Endocrine ROS: negative for - galactorrhea, hair pattern changes, polydipsia/polyuria or temperature intolerance Respiratory ROS: negative for - cough, hemoptysis, shortness of breath or wheezing Cardiovascular ROS: negative for - chest pain, dyspnea on exertion, edema or irregular heartbeat Gastrointestinal ROS: negative for - abdominal pain, diarrhea, hematemesis, nausea/vomiting or stool incontinence Genito-Urinary ROS: negative for - dysuria, hematuria, incontinence or urinary frequency/urgency Musculoskeletal ROS: negative for - joint swelling Neurological ROS: as noted in HPI Dermatological ROS: negative for rash and skin lesion changes   Physical exam: pleasant female in no apparent distress. Blood pressure 149/87, pulse 86, temperature 98.6 F (37 C), temperature source Oral, resp. rate 24, height 5\' 3"  (1.6 m), weight 96.6 kg (212 lb 15.4 oz), SpO2 98.00%. Head: normocephalic. Neck: supple, no bruits, no JVD. Cardiac: no  murmurs. Lungs: clear. Abdomen: soft, no tender, no mass. Extremities: no edema.  Neurologic Examination:                                                                                                      Mental Status: Alert, awake.  Speech fluent but evidence of receptive aphasia.  Follows simple commands. Cranial Nerves: II: Discs flat bilaterally; Visual fields grossly normal, pupils equal, round, reactive to light and accommodation III,IV, VI: ptosis not present, extra-ocular motions intact bilaterally V,VII: smile symmetric, facial light touch sensation normal bilaterally VIII: hearing normal bilaterally IX,X: gag reflex present XI: bilateral shoulder shrug XII: midline tongue extension without atrophy or fasciculations  Motor: Mild right hemiparesis, arm greater than leg. Tone and bulk:normal tone throughout; no atrophy noted Sensory: Pinprick and light touch intact throughout, bilaterally Deep Tendon Reflexes:  Right: Upper Extremity   Left: Upper extremity  biceps (C-5 to C-6) 2/4   biceps (C-5 to C-6) 2/4 tricep (C7) 2/4    triceps (C7) 2/4 Brachioradialis (C6) 2/4  Brachioradialis (C6) 2/4  Lower Extremity Lower Extremity  quadriceps (L-2 to L-4) 2/4   quadriceps (L-2 to L-4) 2/4 Achilles (S1) 2/4   Achilles (S1) 2/4  Plantars: Right: downgoing   Left: downgoing Cerebellar: normal finger-to-nose, heel-to-shin no tested Gait:  No tested. CV: pulses palpable throughout    Lab Results  Component Value Date/Time   CHOL 105 05/06/2013  4:44 PM    Results for orders placed during the hospital encounter of 11/20/13 (from the past 48 hour(s))  CBC WITH DIFFERENTIAL     Status: None   Collection Time    11/20/13  4:41 PM      Result Value Range   WBC 7.7  4.0 - 10.5 K/uL   RBC 4.40  3.87 - 5.11 MIL/uL   Hemoglobin 12.9  12.0 - 15.0 g/dL   HCT 81.1  91.4 - 78.2 %   MCV 86.8  78.0 - 100.0 fL   MCH 29.3  26.0 - 34.0 pg   MCHC 33.8  30.0 - 36.0 g/dL   RDW 95.6   21.3 - 08.6 %   Platelets 251  150 - 400 K/uL   Neutrophils Relative % 56  43 - 77 %   Lymphocytes Relative 32  12 - 46 %   Monocytes Relative 7  3 - 12 %   Eosinophils Relative 4  0 - 5 %   Basophils Relative 1  0 - 1 %   Neutro Abs 4.3  1.7 - 7.7 K/uL   Lymphs Abs 2.5  0.7 - 4.0 K/uL   Monocytes Absolute 0.5  0.1 - 1.0 K/uL   Eosinophils Absolute 0.3  0.0 - 0.7 K/uL   Basophils Absolute 0.1  0.0 - 0.1 K/uL   RBC Morphology ROULEAUX     WBC Morphology WHITE COUNT CONFIRMED ON SMEAR    COMPREHENSIVE METABOLIC PANEL     Status: Abnormal   Collection Time    11/20/13  4:41 PM      Result Value Range   Sodium 136  135 - 145 mEq/L   Potassium 3.1 (*) 3.5 - 5.1 mEq/L   Chloride 98  96 - 112 mEq/L   CO2 28  19 - 32 mEq/L   Glucose, Bld 144 (*) 70 - 99 mg/dL   BUN 9  6 - 23 mg/dL   Creatinine, Ser 5.78  0.50 - 1.10 mg/dL   Calcium 9.0  8.4 - 46.9 mg/dL   Total Protein 7.5  6.0 - 8.3 g/dL   Albumin 3.5  3.5 - 5.2 g/dL   AST 16  0 - 37 U/L   ALT 11  0 - 35 U/L   Alkaline Phosphatase 118 (*) 39 - 117 U/L   Total Bilirubin 0.6  0.3 - 1.2 mg/dL   GFR calc non Af Amer 75 (*) >90 mL/min   GFR calc Af Amer 87 (*) >90 mL/min   Comment: (NOTE)     The eGFR has been calculated using the CKD EPI equation.     This calculation has not been validated in all clinical situations.     eGFR's persistently <90 mL/min signify possible Chronic Kidney     Disease.  POCT I-STAT TROPONIN I     Status: None   Collection Time    11/20/13  5:11 PM      Result Value  Range   Troponin i, poc 0.01  0.00 - 0.08 ng/mL   Comment 3            Comment: Due to the release kinetics of cTnI,     a negative result within the first hours     of the onset of symptoms does not rule out     myocardial infarction with certainty.     If myocardial infarction is still suspected,     repeat the test at appropriate intervals.  URINALYSIS, ROUTINE W REFLEX MICROSCOPIC     Status: None   Collection Time    11/20/13   5:17 PM      Result Value Range   Color, Urine YELLOW  YELLOW   APPearance CLEAR  CLEAR   Specific Gravity, Urine 1.005  1.005 - 1.030   pH 7.0  5.0 - 8.0   Glucose, UA NEGATIVE  NEGATIVE mg/dL   Hgb urine dipstick NEGATIVE  NEGATIVE   Bilirubin Urine NEGATIVE  NEGATIVE   Ketones, ur NEGATIVE  NEGATIVE mg/dL   Protein, ur NEGATIVE  NEGATIVE mg/dL   Urobilinogen, UA 0.2  0.0 - 1.0 mg/dL   Nitrite NEGATIVE  NEGATIVE   Leukocytes, UA NEGATIVE  NEGATIVE   Comment: MICROSCOPIC NOT DONE ON URINES WITH NEGATIVE PROTEIN, BLOOD, LEUKOCYTES, NITRITE, OR GLUCOSE <1000 mg/dL.  URINE RAPID DRUG SCREEN (HOSP PERFORMED)     Status: None   Collection Time    11/20/13  5:17 PM      Result Value Range   Opiates NONE DETECTED  NONE DETECTED   Cocaine NONE DETECTED  NONE DETECTED   Benzodiazepines NONE DETECTED  NONE DETECTED   Amphetamines NONE DETECTED  NONE DETECTED   Tetrahydrocannabinol NONE DETECTED  NONE DETECTED   Barbiturates NONE DETECTED  NONE DETECTED   Comment:            DRUG SCREEN FOR MEDICAL PURPOSES     ONLY.  IF CONFIRMATION IS NEEDED     FOR ANY PURPOSE, NOTIFY LAB     WITHIN 5 DAYS.                LOWEST DETECTABLE LIMITS     FOR URINE DRUG SCREEN     Drug Class       Cutoff (ng/mL)     Amphetamine      1000     Barbiturate      200     Benzodiazepine   200     Tricyclics       300     Opiates          300     Cocaine          300     THC              50  CORTISOL     Status: None   Collection Time    11/20/13 10:40 PM      Result Value Range   Cortisol, Plasma 5.8     Comment: (NOTE)     AM:  4.3 - 22.4 ug/dL     PM:  3.1 - 16.1 ug/dL     Performed at [facility]  TSH     Status: None   Collection Time    11/20/13 10:40 PM      Result Value Range   TSH 0.975  0.350 - 4.500 uIU/mL   Comment: Performed at [facility]  COMPREHENSIVE METABOLIC PANEL  Status: Abnormal   Collection Time    11/21/13  8:30 AM      Result Value Range   Sodium  138  135 - 145 mEq/L   Comment: REPEATED TO VERIFY   Potassium 3.9  3.5 - 5.1 mEq/L   Comment: REPEATED TO VERIFY     DELTA CHECK NOTED   Chloride 101  96 - 112 mEq/L   Comment: REPEATED TO VERIFY   CO2 26  19 - 32 mEq/L   Glucose, Bld 80  70 - 99 mg/dL   BUN 7  6 - 23 mg/dL   Creatinine, Ser 0.98  0.50 - 1.10 mg/dL   Calcium 9.4  8.4 - 11.9 mg/dL   Total Protein 7.9  6.0 - 8.3 g/dL   Albumin 3.8  3.5 - 5.2 g/dL   AST 12  0 - 37 U/L   ALT 11  0 - 35 U/L   Alkaline Phosphatase 124 (*) 39 - 117 U/L   Total Bilirubin 1.1  0.3 - 1.2 mg/dL   GFR calc non Af Amer 89 (*) >90 mL/min   GFR calc Af Amer >90  >90 mL/min   Comment: (NOTE)     The eGFR has been calculated using the CKD EPI equation.     This calculation has not been validated in all clinical situations.     eGFR's persistently <90 mL/min signify possible Chronic Kidney     Disease.  MAGNESIUM     Status: None   Collection Time    11/21/13  8:30 AM      Result Value Range   Magnesium 2.1  1.5 - 2.5 mg/dL  CBC WITH DIFFERENTIAL     Status: None   Collection Time    11/21/13  8:30 AM      Result Value Range   WBC 7.9  4.0 - 10.5 K/uL   RBC 4.62  3.87 - 5.11 MIL/uL   Hemoglobin 13.8  12.0 - 15.0 g/dL   HCT 14.7  82.9 - 56.2 %   MCV 87.0  78.0 - 100.0 fL   MCH 29.9  26.0 - 34.0 pg   MCHC 34.3  30.0 - 36.0 g/dL   RDW 13.0  86.5 - 78.4 %   Platelets 248  150 - 400 K/uL   Neutrophils Relative % 57  43 - 77 %   Neutro Abs 4.5  1.7 - 7.7 K/uL   Lymphocytes Relative 33  12 - 46 %   Lymphs Abs 2.6  0.7 - 4.0 K/uL   Monocytes Relative 9  3 - 12 %   Monocytes Absolute 0.7  0.1 - 1.0 K/uL   Eosinophils Relative 2  0 - 5 %   Eosinophils Absolute 0.1  0.0 - 0.7 K/uL   Basophils Relative 0  0 - 1 %   Basophils Absolute 0.0  0.0 - 0.1 K/uL  GLUCOSE, CAPILLARY     Status: None   Collection Time    11/22/13  3:26 AM      Result Value Range   Glucose-Capillary 89  70 - 99 mg/dL   Comment 1 Documented in Chart     Comment 2  Notify RN    TROPONIN I     Status: None   Collection Time    11/22/13  5:45 AM      Result Value Range   Troponin I <0.30  <0.30 ng/mL   Comment:  Due to the release kinetics of cTnI,     a negative result within the first hours     of the onset of symptoms does not rule out     myocardial infarction with certainty.     If myocardial infarction is still suspected,     repeat the test at appropriate intervals.  BASIC METABOLIC PANEL     Status: Abnormal   Collection Time    11/22/13  5:45 AM      Result Value Range   Sodium 136  135 - 145 mEq/L   Potassium 3.2 (*) 3.5 - 5.1 mEq/L   Comment: DELTA CHECK NOTED   Chloride 102  96 - 112 mEq/L   CO2 21  19 - 32 mEq/L   Glucose, Bld 99  70 - 99 mg/dL   BUN 9  6 - 23 mg/dL   Creatinine, Ser 9.81  0.50 - 1.10 mg/dL   Calcium 8.3 (*) 8.4 - 10.5 mg/dL   GFR calc non Af Amer >90  >90 mL/min   GFR calc Af Amer >90  >90 mL/min   Comment: (NOTE)     The eGFR has been calculated using the CKD EPI equation.     This calculation has not been validated in all clinical situations.     eGFR's persistently <90 mL/min signify possible Chronic Kidney     Disease.  CBC WITH DIFFERENTIAL     Status: None   Collection Time    11/22/13  5:45 AM      Result Value Range   WBC 7.7  4.0 - 10.5 K/uL   RBC 4.28  3.87 - 5.11 MIL/uL   Hemoglobin 12.5  12.0 - 15.0 g/dL   HCT 19.1  47.8 - 29.5 %   MCV 85.5  78.0 - 100.0 fL   MCH 29.2  26.0 - 34.0 pg   MCHC 34.2  30.0 - 36.0 g/dL   RDW 62.1  30.8 - 65.7 %   Platelets 234  150 - 400 K/uL   Neutrophils Relative % 65  43 - 77 %   Neutro Abs 5.0  1.7 - 7.7 K/uL   Lymphocytes Relative 22  12 - 46 %   Lymphs Abs 1.7  0.7 - 4.0 K/uL   Monocytes Relative 11  3 - 12 %   Monocytes Absolute 0.8  0.1 - 1.0 K/uL   Eosinophils Relative 2  0 - 5 %   Eosinophils Absolute 0.1  0.0 - 0.7 K/uL   Basophils Relative 0  0 - 1 %   Basophils Absolute 0.0  0.0 - 0.1 K/uL  PROTIME-INR     Status: None    Collection Time    11/22/13  5:45 AM      Result Value Range   Prothrombin Time 13.4  11.6 - 15.2 seconds   INR 1.04  0.00 - 1.49    Ct Head Wo Contrast  11/20/2013   CLINICAL DATA:  Altered mental status; syncope  EXAM: CT HEAD WITHOUT CONTRAST  TECHNIQUE: Contiguous axial images were obtained from the base of the skull through the vertex without intravenous contrast. Study was obtained within 24 hr of patient's arrival at the emergency department.  COMPARISON:  October 09, 2011  FINDINGS: The ventricles are normal in size and configuration. There is no mass, hemorrhage, extra-axial fluid collection, or midline shift. Gray-white compartments are normal. There is no demonstrable acute infarct.  Bony calvarium appears intact. The mastoid air cells are clear. There  is diffuse opacification of all paranasal sinuses.  IMPRESSION: Pansinusitis, extensive.  Study otherwise unremarkable.   Electronically Signed   By: Bretta Bang M.D.   On: 11/20/2013 18:27   Mr Brain Wo Contrast  11/21/2013   CLINICAL DATA:  Seizure.  History of colon cancer.  EXAM: MRI HEAD WITHOUT CONTRAST  TECHNIQUE: Multiplanar, multiecho pulse sequences of the brain and surrounding structures were obtained without intravenous contrast.  COMPARISON:  CT head 11/20/2013  FINDINGS: Sagittal T1 and diffusion-weighted imaging was performed. The patient was unable to hold still and tolerate further imaging.  Acute infarct in the left posterior insula and left parietal lobe consistent with MCA infarction. Small area of acute infarct in the head of the caudate on the left.  Ventricle size is normal.  No midline shift.  Extensive mucosal edema throughout the paranasal sinuses as noted on CT.  IMPRESSION: Acute infarct left MCA territory. This involves the left posterior insula and parietal lobe. Small area of acute infarct in the head of the caudate on the left.   Electronically Signed   By: Marlan Palau M.D.   On: 11/21/2013 13:52    Assessment/Plan: 63 y/o with left MCA distribution infarct, most likely embolic. Had transient worsening of her right HP and receptive aphasia last night, but now seems to be back to her initialexam. On aspirin. Recommend: 1) Complete stroke work up. 2) Continue aspirin pending results neurological testing. Will follow up.  Wyatt Portela, MD 11/22/2013, 7:00 AM [facility]

## 2013-11-22 NOTE — Progress Notes (Signed)
At 0315, Andrea Mason, NT called me into pt's room. Upon reassessment, I noticed a difference in patient's status. Andrea Mason, NT verbalized that while patient was being transferred to [facility], pt experienced total weakness with eyes wandering and rolling almost in back of head. No loss of consciousness occurred. Immediately asked her to tell me her name. Pt was unable to form words; speech was incomprehensible and slurred. Upon neuro assessment, severe R sided weakness was noted compared to slight weakness on prior assessment. Pupils were sluggish. VS were taken and were stable. Sophia, RN called Huntley Dec RN [facility] for a potential code stroke. NP Lenny Pastel was called. Once Rapid Response arrived, operator was called to notify of "code stroke" at 0320. See Rapid Response note for further note. Will continue to monitor closely. Fraser Din, RN

## 2013-11-22 NOTE — Progress Notes (Signed)
Was progress note. Chief complaint. Neurologic changes. History of present illness. This 62 year old female hospitalized with acute left MCA territory stroke involving insula and parietal lobe with residual right-sided weakness and receptive aphasia. Patient was at her baseline and while being assisted to the bathroom staff noted apparent inability to speak at all and increased right arm weakness with no independent movement. Patient was placed back to bed and code stroke was initiated. I was informed and cannot to see the patient at the bedside. By the time I arrived her symptom worsening of speech and right sided weakness had resolved and her staff and family the patient had return to her baseline deficit from the original stroke. Vital signs temperature 98.6, pulse 84, respiration 20, blood pressure 144/95 status post 500 cc bolus. O2 sats 97%. General appearance. Obese elderly female who is alert and cooperative with exam. Partially oriented. Speech is hesitant but clear with some word finding deficit observed. Cardiac. Regular rate and rhythm per radial pulse. Lungs. Clear. Abdomen. Soft and obese with no pain during palpation. Neurologic. Speech is clear but noted some word finding difficulty. Right side weakness in the right arm and leg. Right grip strength is weak. Impression/plan. Problem #1. Likely TIA. All symptom changes noted by nursing staff appear to have self resolved. Discussed the case with neural hospitalist who felt that no further action needs to be taken at this time given patient's return to prior baseline. Permissive hypertension with recommended systolic blood pressure at least 140's. Blood work drawn and will follow for results. Patient clinically stable at this time.

## 2013-11-22 NOTE — Progress Notes (Signed)
Patient rapidly returned to baseline confusion and motor responses after episode. Neuro checks q1hr x4. Able to tell me her first name clearly and can weakly squeeze my hand with her right hand. Pupils brisk. Pt states she needs to use the bathroom. Verbalized the importance of not getting out of bed after what happened earlier. Bedpan was placed but pt refused to use the bathroom. After much discussion, patient still refused. Bedpan removed. When asked if she would rather have a temporary catheter, she refused as well. Susie, RN notified. Will continue to monitor. Fraser Din, RN

## 2013-11-22 NOTE — Progress Notes (Signed)
Rehab Admissions Coordinator Note:  Patient was screened by Clois Dupes for appropriateness for an Inpatient Acute Rehab Consult.  At this time, we are recommending Inpatient Rehab consult.  Clois Dupes 11/22/2013, 10:11 PM  I can be reached at [phone].

## 2013-11-23 DIAGNOSIS — E269 Hyperaldosteronism, unspecified: Secondary | ICD-10-CM

## 2013-11-23 MED ORDER — ATORVASTATIN CALCIUM 20 MG PO TABS
20.0000 mg | ORAL_TABLET | Freq: Every day | ORAL | Status: DC
  Administered 2013-11-23 – 2013-11-24 (×2): 20 mg via ORAL
  Filled 2013-11-23 (×3): qty 1

## 2013-11-23 MED ORDER — AMOXICILLIN-POT CLAVULANATE 875-125 MG PO TABS
1.0000 | ORAL_TABLET | Freq: Two times a day (BID) | ORAL | Status: DC
  Administered 2013-11-23 – 2013-11-25 (×4): 1 via ORAL
  Filled 2013-11-23 (×5): qty 1

## 2013-11-23 MED ORDER — ASPIRIN EC 325 MG PO TBEC
325.0000 mg | DELAYED_RELEASE_TABLET | Freq: Every day | ORAL | Status: DC
  Administered 2013-11-23 – 2013-11-25 (×3): 325 mg via ORAL
  Filled 2013-11-23 (×3): qty 1

## 2013-11-23 MED ORDER — SPIRONOLACTONE 12.5 MG HALF TABLET
12.5000 mg | ORAL_TABLET | Freq: Every day | ORAL | Status: DC
  Administered 2013-11-23 – 2013-11-25 (×3): 12.5 mg via ORAL
  Filled 2013-11-23 (×3): qty 1

## 2013-11-23 NOTE — Progress Notes (Addendum)
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  -  Continue telemetry:  NSR -  MRA:  High grade stenosis or occlusion of the inferior posterior left M2 branch corresponding with the area of acute infarction -  Carotid duplex:  < 40% stenosis bilaterall, antegrade vertebral artery flow -  EEG:  slowing in the left temporal region without epileptiform activity -  ECHO: mild LVH, EF of 50-55%, no PFO identified -  Continue Aspirin 325mg  daily -  Will add plavix because of high grade stenosis or occlusion of larger vessel for now, but ultimately defer to neurology -  Lipid panel at goal, but will start atorvastatin per new guidelines -  Hemoglobin A1c 6.1 -  PT/OT/SLP recommending CIR -  PM&R consult placed -  Appreciate neurology assistance -  Telemetry:  NSR  Acute sinusitis, improving.   -  Transition to augmentin  Hypokalemia, resolved with supplementation. Likely related to hyperaldosteronemia -  Continue KCl in IVF -  Repeat in AM -  Add spironolactone  Asthma, stable, continue albuterol prn  HTN, blood pressure elevated -  Continue prn hydralazine -  Add spironolactone   Fall, slid to floor and hit bottom but did not hit head.  Family and RN were in room when incident happened -  No pain of limbs or pelvic instability  Diet:  Dysphagia 3 with thin liquids Access:  PIV  IVF:  yes Proph:  lovenox  Code Status: full Family Communication: patient and her daughter-in-law Disposition Plan:  CIR eval vs. SNF  Consultants:  Neurology by phone  Procedures:  EEG  MRI brain  Antibiotics:  augmentin 12/18 >> 12/20, restarted 12/21  Ceftriaxone 12/20 >> 12/21   HPI/Subjective:  Denies any  problems, better able to follow commands today.    Objective: Filed Vitals:   11/22/13 2200 11/23/13 0604 11/23/13 0647 11/23/13 1423  BP: 131/73 142/102 136/92 141/91  Pulse: 83 83  80  Temp: 98.8 F (37.1 C) 98.3 F (36.8 C)  97.9 F (36.6 C)  TempSrc: Oral Oral  Oral  Resp: 20 20  18   Height:      Weight:      SpO2: 100% 99%  100%    Intake/Output Summary (Last 24 hours) at 11/23/13 1709 Last data filed at 11/23/13 1431  Gross per 24 hour  Intake   1740 ml  Output   1650 ml  Net     90 ml   Filed Weights   11/20/13 2115  Weight: 96.6 kg (212 lb 15.4 oz)    Exam:   General:  AAF, No acute distress  HEENT:  NCAT, MMM  Cardiovascular:  RRR, nl S1, S2 no mrg, 2+ pulses, warm extremities  Respiratory:  CTAB, no increased WOB  Abdomen:   NABS, soft, NT/ND  MSK:   Normal tone and bulk, no LEE  Neuro:  4/5 right hand and right leg strength with some neglect of the right side.  Tends to lean to the right side in bed.  Possibly diminished sensation of the right hand and leg also.  Still confused and having difficulty following commands, but speech is more fluent today  Data Reviewed: Basic Metabolic Panel:  Recent Labs Lab 11/20/13 1641 11/21/13 0830 11/22/13  0545  NA 136 138 136  K 3.1* 3.9 3.2*  CL 98 101 102  CO2 28 26 21   GLUCOSE 144* 80 99  BUN 9 7 9   CREATININE 0.83 0.77 0.70  CALCIUM 9.0 9.4 8.3*  MG  --  2.1  --    Liver Function Tests:  Recent Labs Lab 11/20/13 1641 11/21/13 0830  AST 16 12  ALT 11 11  ALKPHOS 118* 124*  BILITOT 0.6 1.1  PROT 7.5 7.9  ALBUMIN 3.5 3.8   No results found for this basename: LIPASE, AMYLASE,  in the last 168 hours No results found for this basename: AMMONIA,  in the last 168 hours CBC:  Recent Labs Lab 11/20/13 1641 11/21/13 0830 11/22/13 0545  WBC 7.7 7.9 7.7  NEUTROABS 4.3 4.5 5.0  HGB 12.9 13.8 12.5  HCT 38.2 40.2 36.6  MCV 86.8 87.0 85.5  PLT 251 248 234   Cardiac Enzymes:  Recent  Labs Lab 11/22/13 0545  TROPONINI <0.30   BNP (last 3 results) No results found for this basename: PROBNP,  in the last 8760 hours CBG:  Recent Labs Lab 11/22/13 0326  GLUCAP 89    No results found for this or any previous visit (from the past 240 hour(s)).   Studies: Mr Shirlee Latch Wo Contrast  11/22/2013   CLINICAL DATA:  Right-sided weakness.  Acute cerebral infarct.  EXAM: MRA HEAD WITHOUT CONTRAST  TECHNIQUE: Angiographic images of the Circle of Willis were obtained using MRA technique without intravenous contrast.  COMPARISON:  MRI brain 11/21/2013  FINDINGS: The internal carotid arteries demonstrate slight irregularity through the cavernous segments. A tiny inferior outpouching in the left posterior communicating artery likely represents a small infundibulum. A follow-up exam at 1 year is recommended to assure stability and lack of growth.  There is mild narrowing of the A1 segments bilaterally. The right M1 segment is normal. There is some narrowing of the left M1 segment without a focal stenosis. A high-grade stenosis or focal occlusion is present proximally in the inferior posterior left M2 branch corresponding to the area of acute infarction. There is asymmetric attenuation of the more distal anterior superior left M2 branch. Minimal distal branch vessel disease is present on the right.  The vertebral arteries are codominant. The right PICA origin is visualized and normal. The left AICA is dominant. The basilar artery is within normal limits. Both posterior cerebral arteries originate from the basilar tip. There is mild attenuation of distal PCA branch vessels.  IMPRESSION: 1. High-grade stenosis or likely occlusion of the inferior posterior left M2 branch corresponding to the area of acute infarction. 2. Asymmetric distal small vessel disease in the more anterior and superior left M2 branch. 3. Mild atherosclerotic irregularity within the cavernous carotid arteries and A1 segments mild  laterally. 4. Mild distal small vessel disease otherwise. 5. Tiny inferior outpouching at the left posterior communicating artery likely represents a small infundibulum. A follow-up exam at 1 year is recommended to assure stability and lack of growth.   Electronically Signed   By: Gennette Pac M.D.   On: 11/22/2013 11:57    Scheduled Meds: . amoxicillin-clavulanate  1 tablet Oral BID  . aspirin EC  325 mg Oral Daily  . atorvastatin  20 mg Oral q1800  . budesonide-formoterol  2 puff Inhalation BID  . enoxaparin (LOVENOX) injection  40 mg Subcutaneous Q24H  . fluticasone  2 spray Each Nare BID  . pantoprazole (PROTONIX) IV  40 mg Intravenous Q24H  .  sodium chloride  3 mL Intravenous Q12H  . spironolactone  12.5 mg Oral Daily   Continuous Infusions: . dextrose 5 % and 0.45 % NaCl with KCl 40 mEq/L 75 mL/hr (11/23/13 1624)    Principal Problem:   Stroke Active Problems:   HYPERTENSION   RHINOSINUSITIS, CHRONIC   Intrinsic asthma, unspecified   Hypokalemia   Syncope    Time spent: 30 min    Verona Hartshorn  [facility] Pager [phone]. If 7PM-7AM, please contact night-coverage at [email or web address], password [facility] 11/23/2013, 5:09 PM  LOS: 3 days

## 2013-11-23 NOTE — Evaluation (Signed)
Occupational Therapy Evaluation Patient Details Name: Andrea Mason MRN: [id number] DOB: May 31, 1951 Today's Date: 11/23/2013 Time: 4098-1191 OT Time Calculation (min): 35 min  OT Assessment / Plan / Recommendation History of present illness Andrea Mason is an 62 y.o. female with a past medical history significant for HTN, obesity, depression, DJD, colonic polyps, primary hyperaldosteronism, admitted to [facility] due to confusion and syncope.  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 This involves the left posterior insula and parietal lobe. Small area of acute infarct in the head of the caudate on the left.    Clinical Impression   Pt presents with L MCA CVA.  She was independent with adls prior to admission.  Pt has movement in RUE but has decreased sensation and attention to this (and environment).  Unable to ascertain if she has a VFC vs inattention or both. She has some difficulty with expression (cannot name objects) and reception.  She also presents with generalized weakness and decreased balance.  Pt will benefit from skilled OT to address these areas.  She is an excellent candidate for CIR.      OT Assessment  Patient needs continued OT Services    Follow Up Recommendations  CIR Pt is an excellent candidate   Barriers to Discharge      Equipment Recommendations   (to be further assessed:  likely 3:1 when ready)    Recommendations for Other Services Rehab consult  Frequency  Min 3X/week    Precautions / Restrictions Precautions Precautions: Fall Restrictions Weight Bearing Restrictions: No   Pertinent Vitals/Pain No c/o pain.  BP at eob was 139/96    ADL  Eating/Feeding: NPO Grooming: Set up;Wash/dry face;Wash/dry hands;Minimal assistance (set up face with LUE; minA to initiate hands) Where Assessed - Grooming: Supine, head of bed up Upper Body Bathing: Maximal assistance Where Assessed - Upper Body Bathing: Supine, head of bed  up Lower Body Bathing: +1 Total assistance Where Assessed - Lower Body Bathing: Supine, head of bed up;Rolling right and/or left Upper Body Dressing: Maximal assistance Where Assessed - Upper Body Dressing: Supported sitting Lower Body Dressing: +2 Total assistance Lower Body Dressing: Patient Percentage: 0% Where Assessed - Lower Body Dressing: Unsupported sit to stand Toileting - Clothing Manipulation and Hygiene: +1 Total assistance Where Assessed - Toileting Clothing Manipulation and Hygiene: Supine, head of bed flat;Rolling right and/or left Transfers/Ambulation Related to ADLs: when pt first sat up, expression funny. Unable to verbalize if dizzy.  BP 139/96.  Stood briefly with A x 2.  Unable to advance RLE, legs supported by bed ADL Comments: pt has decreased attention:  cued to continue to wash face.  Overattends to L side; when LUE engaged, can have her use R.  Hand over hand assist to initiate use of R for applying lotion.      OT Diagnosis: Hemiplegia dominant side  OT Problem List: Decreased strength;Decreased activity tolerance;Decreased range of motion;Impaired balance (sitting and/or standing);Impaired vision/perception;Decreased coordination;Decreased safety awareness;Impaired sensation;Impaired UE functional use OT Treatment Interventions: Self-care/ADL training;Therapeutic exercise;Neuromuscular education;DME and/or AE instruction;Therapeutic activities;Cognitive remediation/compensation;Visual/perceptual remediation/compensation;Patient/family education;Balance training   OT Goals(Current goals can be found in the care plan section) Acute Rehab OT Goals Patient Stated Goal: eat OT Goal Formulation: With patient/family Time For Goal Achievement: 12/08/12 Potential to Achieve Goals: Good ADL Goals Pt Will Perform Grooming: with supervision;sitting (min cues for R sided items and to initiate) Pt Will Perform Upper Body Bathing: with min assist;sitting (min cues) Pt Will  Perform Upper Body Dressing: with min assist;sitting (min cues) Pt Will Transfer to Toilet: with +2 assist;stand pivot transfer;squat pivot transfer;bedside commode (pt 50%) Additional ADL Goal #1: pt will maintain supported standing for adls with mod A, using RW x 2 minutes Additional ADL Goal #2: Pt will attend to R side during bil activity with min cues  Visit Information  Last OT Received On: 11/23/13 Assistance Needed: +2 History of Present Illness: Andrea Mason is an 62 y.o. female with a past medical history significant for HTN, obesity, depression, DJD, colonic polyps, primary hyperaldosteronism, admitted to [facility] due to confusion and syncope.  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 This involves the left posterior insula and parietal lobe. Small area of acute infarct in the head of the caudate on the left.        Prior Functioning     Home Living Family/patient expects to be discharged to:: Private residence Living Arrangements: Other relatives Available Help at Discharge: Family Type of Home: House Home Access: Level entry Home Equipment: None Prior Function Level of Independence: Independent Communication Communication: Receptive difficulties;Expressive difficulties Dominant Hand: Right         Vision/Perception Vision - History Patient Visual Report:  (does not have glasses; tracking wfls; either R VFC or inatte) Vision - Assessment Additional Comments: unable to perform visual screen due to language.  She overattends to L when 2 items presented.   Cognition  Cognition Arousal/Alertness: Awake/alert Behavior During Therapy: Impulsive Overall Cognitive Status: Impaired/Different from baseline Area of Impairment: Safety/judgement;Awareness;Problem solving;Orientation Safety/Judgement: Decreased awareness of safety;Decreased awareness of deficits General Comments: pt has word finding difficulty/unable to name  objects.   She knows something is wrong "with this leg (R).  States this hospital doesn't give you anything to eat.  Unable to understand that she needs a swallow study first. Pt currently at sustained attention   Extremity/Trunk Assessment Upper Extremity Assessment Upper Extremity Assessment: RUE deficits/detail RUE Deficits / Details: pt has movement throughout RUE but decreased sensation.  Able to perform opening and closing hand and lifting arm up to 90 degreesin bed and about 70 from EOB with elbow bent,  when asked to touch my hand.  Unable to follow all motor commands:  could not wave.  Tone increased at rest.  Attempted weight bearing through RUE when sitting at EOB RUE Sensation: decreased light touch (and deep touch) RUE Coordination: decreased fine motor (gross motor intact)     Mobility Bed Mobility Rolling Right: 4: Min assist;With rail Rolling Left: 4: Min assist;With rail Right Sidelying to Sit: 1: +2 Total assist Right Sidelying to Sit: Patient Percentage: 50% Sit to Supine: 1: +2 Total assist Sit to Supine: Patient Percentage: 30% Details for Bed Mobility Assistance: rolled to R side and pushed up to sitting Transfers Transfers: Sit to Stand Sit to Stand: 1: +2 Total assist;From bed Sit to Stand: Patient Percentage: 40% Details for Transfer Assistance: legs supported by back of bed. Unable to side step      Exercise     Balance Balance Balance Assessed: Yes Static Sitting Balance Static Sitting - Balance Support: Feet supported;Left upper extremity supported Static Sitting - Level of Assistance: 4: Min assist Static Sitting - Comment/# of Minutes: 10 minutes Dynamic Sitting Balance Dynamic Sitting - Balance Support: Feet supported;Left upper extremity supported Dynamic Sitting - Level of Assistance: 4: Min assist;3: Mod assist Dynamic Sitting - Comments: minimal forward reach with L in all directions.  Also held both hands and slightly shifted trunk with push/pull  alternative action   End of Session OT - End of Session Activity Tolerance: Patient tolerated treatment well Patient left: in bed;with call bell/phone within reach;with bed alarm set  GO     Andrea Mason 11/23/2013, 10:58 AM Andrea Mason, OTR/L [phone] 11/23/2013

## 2013-11-23 NOTE — Evaluation (Addendum)
Speech Language Pathology Evaluation Patient Details Name: Andrea Mason MRN: [id number] DOB: 10-06-1951 Today's Date: 11/23/2013 Time: 1030-1100 SLP Time Calculation (min): 30 min  Problem List:  Patient Active Problem List   Diagnosis Date Noted  . Stroke 11/21/2013  . Syncope 11/20/2013  . Cough 11/16/2013  . 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 Back pain 11/06/2013  . Hypokalemia 05/06/2013  . Family history of colon cancer 05/06/2013  . Lipoma 09/03/2012  . Hyperglycemia 08/15/2012  . Constipation 01/02/2012  . Preventative health care 05/24/2011  . 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 HYPERSOMNIA WITH SLEEP APNEA UNSPECIFIED 01/26/2011  . NASAL POLYP 07/28/2010  . FIBROIDS, UTERUS 07/27/2010  . COMPUTERIZED TOMOGRAPHY, CHEST, ABNORMAL 09/16/2009  . Seasonal and perennial allergic rhinitis 05/29/2009  . PERIPHERAL EDEMA 05/28/2009  . Intrinsic asthma, unspecified 11/23/2008  . LEG PAIN, LEFT 06/30/2008  . COLONIC POLYPS, HX OF 06/30/2008  . OBESITY 12/12/2007  . DEPRESSION 12/12/2007  . HYPERTENSION 12/12/2007  . RHINOSINUSITIS, CHRONIC 12/12/2007  . GERD 12/12/2007  . ARTHRITIS 12/12/2007   Past Medical History:  Past Medical History  Diagnosis Date  . Hypertension   . GERD (gastroesophageal reflux disease)   . Allergic rhinitis   . Asthma   . Obesity   . Depression   . History of colonic polyps   . DJD (degenerative joint disease), cervical   . 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 Nasal sinus surgery  07/2006    Dr. Annalee Genta  . Nasal polyp surgery  07/2006    x 2 with Dr. Annalee Genta  . Foot surgery  1998  . Tubal ligation     HPI:  Andrea Mason is an 62 y.o. female with a past medical history significant for HTN, obesity, depression, DJD, colonic polyps, primary hyperaldosteronism, admitted to [facility] due to confusion and syncope.   Acute left MCA territory stroke  involving the insula and parietal lobe and small area in the head of the caudate on the left with residual weakness of the right hand, receptive aphasia.   Assessment / Plan / Recommendation Clinical Impression  Cognitive Linguistic Evaluation completed per Stroke Protocol.  Moderate deficit in area of language processing.  Current level of cognitive skills dificult to determine secondary to decreased language processing skills.  However, direct observation of patient completing basic ADL's indicates deficits in problem solving ,awareness, and reduced sustained attention.  ST to follow in Acute Care Setting to address above deficits to increase participation and safety with basic ADL's.  Recommend continued Speech Services at next level of care.      SLP Assessment  Patient needs continued Speech Lanaguage Pathology Services    Follow Up Recommendations  Outpatient SLP    Frequency and Duration min 2x/week  2 weeks      SLP Goals  SLP Goals Potential to Achieve Goals: Good Potential Considerations: Cooperation/participation level;Previous level of function  SLP Evaluation Prior Functioning  Cognitive/Linguistic Baseline: Within functional limits Type of Home: House  Lives With: Alone Available Help at Discharge: Family;Available PRN/intermittently Education: Highschool education Vocation: Part time employment   Cognition  Overall Cognitive Status: Impaired/Different from baseline Arousal/Alertness: Awake/alert Orientation Level: Disoriented to person;Disoriented to place;Disoriented to time;Disoriented to situation Attention: Focused Focused Attention: Impaired Focused Attention Impairment: Verbal basic;Functional basic Memory: Impaired Memory Impairment: Storage deficit;Retrieval deficit;Decreased recall of new information Awareness: Impaired Awareness Impairment: Intellectual impairment;Emergent impairment;Anticipatory impairment Problem Solving: Impaired Problem Solving  Impairment: Verbal basic;Functional basic Executive  Function: Reasoning;Sequencing;Organizing;Initiating;Self Monitoring Reasoning: Impaired Reasoning Impairment: Verbal basic;Functional basic Sequencing: Impaired Sequencing Impairment: Verbal basic;Functional basic Organizing: Impaired Organizing Impairment: Verbal basic;Functional basic Initiating: Impaired Initiating Impairment: Verbal basic;Functional basic Self Monitoring: Impaired Self Monitoring Impairment: Verbal basic;Functional basic Behaviors: Impulsive;Perseveration Safety/Judgment: Impaired    Comprehension  Auditory Comprehension Overall Auditory Comprehension: Impaired Yes/No Questions: Within Functional Limits;Impaired Basic Biographical Questions: 0-25% accurate Basic Immediate Environment Questions: 0-24% accurate Commands: Impaired One Step Basic Commands: 0-24% accurate Two Step Basic Commands: 0-24% accurate Interfering Components: Attention;Processing speed EffectiveTechniques: Extra processing time;Repetition Visual Recognition/Discrimination Discrimination: Not tested Reading Comprehension Reading Status: Not tested    Expression Expression Primary Mode of Expression: Verbal Verbal Expression Overall Verbal Expression: Impaired Repetition: Impaired Level of Impairment: Phrase level;Sentence level Naming: Impairment Responsive: 0-25% accurate Confrontation: Impaired Convergent: 0-24% accurate Divergent: 0-24% accurate Verbal Errors: Not aware of errors Pragmatics: Impairment Impairments: Eye contact;Topic maintenance;Topic appropriateness Interfering Components: Attention Non-Verbal Means of Communication: Not applicable Written Expression Dominant Hand: Right Written Expression: Not tested   Oral / Motor Oral Motor/Sensory Function Overall Oral Motor/Sensory Function: Impaired Labial ROM: Reduced right Labial Symmetry: Abnormal symmetry right Labial Strength: Reduced Labial Sensation:  Reduced Lingual ROM: Reduced right Lingual Symmetry: Abnormal symmetry right Lingual Strength: Reduced Lingual Sensation: Reduced Facial ROM: Reduced right Facial Symmetry: Right droop Facial Strength: Reduced Facial Sensation: Reduced Velum: Within Functional Limits Mandible: Within Functional Limits Motor Speech Overall Motor Speech: Appears within functional limits for tasks assessed   GO    Moreen Fowler MS, CCC-SLP [phone] [facility] 11/23/2013, 12:06 PM

## 2013-11-23 NOTE — Evaluation (Signed)
Clinical/Bedside Swallow Evaluation Patient Details  Name: Samadhi Mahurin MRN: [id number] Date of Birth: 09-26-1951  Today's Date: 11/23/2013 Time: 1010-1030 SLP Time Calculation (min): 20 min  Past Medical History:  Past Medical History  Diagnosis Date  . Hypertension   . GERD (gastroesophageal reflux disease)   . Allergic rhinitis   . Asthma   . Obesity   . Depression   . History of colonic polyps   . DJD (degenerative joint disease), cervical   . 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 Nasal sinus surgery  07/2006    Dr. Annalee Genta  . Nasal polyp surgery  07/2006    x 2 with Dr. Annalee Genta  . Foot surgery  1998  . Tubal ligation     HPI:  Emree Locicero is an 62 y.o. female with a past medical history significant for HTN, obesity, depression, DJD, colonic polyps, primary hyperaldosteronism, admitted to [facility] due to confusion and syncope.  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  BSE indicated per Stroke Protocol.      Assessment / Plan / Recommendation Clinical Impression  Minimal sensory oral dysphagia marked by right side facial, labial, and lingual weakness and reduced oral awareness of PO's.  Residue with minimal pocketing with regular solids right anterior sulci.  Cognitively unable to complete strategies to reduce residue.  No outward clinical signs of penetration vs. Aspiration with PO's throughout evaluation but patient required [facility] assist to to take modified sips as somewhat impulsive.  Recommend to proceed with dysphagia 3 diet consistency ( mechanical soft) and thin liquids with aspiration precautions. Recommend full supervision with all meals due to current cognitive deficits.  Due to noted decreased sensory recommend cup sips only with no straws to  increase safety.  ST to continue in Acute Care setting for diet tolerance and possible advancement.  Will monitor closely for aspiration with thin liquids.      Aspiration Risk  Mild    Diet Recommendation Dysphagia 3 (Mechanical Soft);Thin liquid   Liquid Administration via: Cup;No straw Medication Administration: Whole meds with puree Supervision: Patient able to self feed;Staff to assist with self feeding;Full supervision/cueing for compensatory strategies Compensations: Slow rate;Small sips/bites;Check for pocketing Postural Changes and/or Swallow Maneuvers: Seated upright 90 degrees;Upright 30-60 min after meal    Other  Recommendations Oral Care Recommendations: Oral care BID   Follow Up Recommendations  Inpatient Rehab    Frequency and Duration min 2x/week  2 weeks       SLP Swallow Goals Refer to Care Plans for listed goals    Swallow Study Prior Functional Status  Type of Home: House Available Help at Discharge: Family    General Date of Onset: 11/23/13 HPI: Trenise Turay is an 62 y.o. female with a past medical history significant for HTN, obesity, depression, DJD, colonic polyps, primary hyperaldosteronism, admitted to [facility] due to confusion and syncope.   Type of Study: Bedside swallow evaluation Diet Prior to this Study: NPO Temperature Spikes Noted: No Respiratory Status: Room air History of Recent Intubation: No Behavior/Cognition: Alert;Cooperative;Pleasant mood;Confused;Requires cueing;Decreased sustained attention Oral Cavity - Dentition: Adequate natural dentition Self-Feeding Abilities: Able to feed self;Needs assist;Needs set up Patient Positioning: Upright in bed Baseline Vocal Quality: Clear Volitional Cough: Cognitively unable to elicit Volitional Swallow: Able to elicit  Oral/Motor/Sensory Function Overall Oral Motor/Sensory Function: Impaired Labial ROM: Reduced right Labial Symmetry: Abnormal symmetry right Labial Strength: Reduced Labial  Sensation: Reduced Lingual ROM: Reduced right Lingual Symmetry: Abnormal symmetry right Lingual Strength: Reduced Lingual Sensation: Reduced Facial ROM: Reduced right Facial Symmetry: Right droop Facial Strength: Reduced Facial Sensation: Reduced Velum: Within Functional Limits Mandible: Within Functional Limits   Ice Chips Ice chips: Within functional limits   Thin Liquid Thin Liquid: Within functional limits Presentation: Cup;Spoon    Nectar Thick Nectar Thick Liquid: Not tested   Honey Thick Honey Thick Liquid: Not tested   Puree Puree: Within functional limits Presentation: Spoon   Solid   GO    Solid: Impaired Oral Phase Functional Implications: Right lateral sulci pocketing;Oral residue      Moreen Fowler MS, CCC-SLP [phone] [facility] 11/23/2013,11:35 AM

## 2013-11-24 ENCOUNTER — Ambulatory Visit: Payer: BC Managed Care – PPO | Admitting: Internal Medicine

## 2013-11-24 DIAGNOSIS — M129 Arthropathy, unspecified: Secondary | ICD-10-CM

## 2013-11-24 DIAGNOSIS — I6350 Cerebral infarction due to unspecified occlusion or stenosis of unspecified cerebral artery: Principal | ICD-10-CM

## 2013-11-24 DIAGNOSIS — I6330 Cerebral infarction due to thrombosis of unspecified cerebral artery: Secondary | ICD-10-CM

## 2013-11-24 DIAGNOSIS — E785 Hyperlipidemia, unspecified: Secondary | ICD-10-CM

## 2013-11-24 LAB — BASIC METABOLIC PANEL
CO2: 20 mEq/L (ref 19–32)
Calcium: 9.1 mg/dL (ref 8.4–10.5)
Chloride: 101 mEq/L (ref 96–112)
Sodium: 134 mEq/L — ABNORMAL LOW (ref 135–145)

## 2013-11-24 MED ORDER — ATORVASTATIN CALCIUM 20 MG PO TABS: 20.0000 mg | ORAL_TABLET | Freq: Every day | ORAL | Status: DC

## 2013-11-24 MED ORDER — ASPIRIN 325 MG PO TBEC: 325.0000 mg | DELAYED_RELEASE_TABLET | Freq: Every day | ORAL | Status: DC

## 2013-11-24 MED ORDER — ZOLPIDEM TARTRATE 5 MG PO TABS
5.0000 mg | ORAL_TABLET | Freq: Once | ORAL | Status: AC
  Administered 2013-11-25: 5 mg via ORAL
  Filled 2013-11-24: qty 1

## 2013-11-24 NOTE — Consult Note (Signed)
Physical Medicine and Rehabilitation Consult Reason for Consult: CVA Referring Physician: [facility]   HPI: Andrea Mason is a 62 y.o. right-handed female with history of hypertension. Admitted 11/20/2013 with altered mental status/questionable syncopal episode and right side weakness with aphasia. MRI of the brain showed acute infarct left MCA territory as well small area of acute infarct head of the caudate on the left. Echocardiogram with ejection fraction of 55% normal systolic function. Carotid Dopplers negative for ICA stenosis. EEG with no seizure activity noted. Neurology services consulted. Patient did not receive TPA. Aspirin added for CVA prophylaxis. Subcutaneous Lovenox for DVT prophylaxis. Placed on Augmentin for questionable sinusitis. Physical therapy evaluation completed 11/22/2013 with recommendations of physical medicine rehabilitation consult to consider inpatient rehabilitation services.   Review of Systems  Gastrointestinal:       GERD  Musculoskeletal: Positive for myalgias.  Psychiatric/Behavioral: Positive for depression.  All other systems reviewed and are negative.   Past Medical History  Diagnosis Date  . Hypertension   . GERD (gastroesophageal reflux disease)   . Allergic rhinitis   . Asthma   . Obesity   . Depression   . History of colonic polyps   . DJD (degenerative joint disease), cervical   . 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 Nasal sinus surgery  07/2006    Dr. Annalee Genta  . Nasal polyp surgery  07/2006    x 2 with Dr. Annalee Genta  . Foot surgery  1998  . Tubal ligation     Family History  Problem Relation Age of Onset  . Colon cancer Brother    Social History:  reports that she has never smoked. She has never used smokeless tobacco. She reports that she does not drink alcohol or use illicit drugs. Allergies:  Allergies  Allergen Reactions  . Ivp Dye [Iodinated Diagnostic Agents] Nausea And  Vomiting    Reaction: hot flashes  . Promethazine-Codeine Nausea And Vomiting   Medications Prior to Admission  Medication Sig Dispense Refill  . albuterol (PROAIR HFA) 108 (90 BASE) MCG/ACT inhaler Inhale 2 puffs into the lungs every 6 (six) hours as needed for wheezing. 2 puffs every 4 hours as needed only  if your can't catch your breath  1 Inhaler  2  . amoxicillin-clavulanate (AUGMENTIN) 875-125 MG per tablet Take 1 tablet by mouth 2 (two) times daily.  20 tablet  0  . Biotin 5000 MCG CAPS Take 1 capsule by mouth daily.      . budesonide-formoterol (SYMBICORT) 80-4.5 MCG/ACT inhaler Take 2 puffs first thing in am and then another 2 puffs about 12 hours later.  1 Inhaler  12  . cetirizine (ZYRTEC) 10 MG tablet Take 10 mg by mouth at bedtime.       . cyclobenzaprine (FLEXERIL) 5 MG tablet Take 1 tablet (5 mg total) by mouth 3 (three) times daily as needed for muscle spasms.  60 tablet  1  . fluticasone (FLONASE) 50 MCG/ACT nasal spray Place 2 sprays into both nostrils 2 (two) times daily.  16 g  11  . guaiFENesin (MUCINEX) 600 MG 12 hr tablet 1-2 twice daily as needed      . montelukast (SINGULAIR) 10 MG tablet Take 1 tablet (10 mg total) by mouth at bedtime.  30 tablet  11  . omeprazole (PRILOSEC) 20 MG capsule Take 1 capsule (20 mg total) by mouth every morning.  30 capsule  11  . oxymetazoline (AFRIN) 0.05 % nasal spray Place  2 sprays into the nose 2 (two) times daily as needed. For 5 days      . ranitidine (ZANTAC) 150 MG tablet Take 150 mg by mouth 2 (two) times daily.      . sodium chloride (OCEAN) 0.65 % nasal spray Place 2 sprays into the nose as needed for congestion.      Marland Kitchen spironolactone (ALDACTONE) 25 MG tablet Take 25 mg by mouth daily.      . traMADol (ULTRAM) 50 MG tablet 1-2 every 4 hours as needed for cough or pain  40 tablet  0  . zinc gluconate 50 MG tablet Take 50 mg by mouth daily.      Marland Kitchen HYDROcodone-homatropine (HYCODAN) 5-1.5 MG/5ML syrup Take 5 mLs by mouth once.         Home: Home Living Family/patient expects to be discharged to:: Private residence Living Arrangements: Other relatives Available Help at Discharge: Family;Available PRN/intermittently Type of Home: House Home Access: Level entry Home Layout: One level Home Equipment: None  Lives With: Alone  Functional History: Prior Function Vocation: Part time employment Functional Status:  Mobility: Bed Mobility Bed Mobility: Supine to Sit;Sit to Supine Rolling Right: 4: Min assist;With rail Rolling Left: 4: Min assist;With rail Right Sidelying to Sit: 1: +2 Total assist Right Sidelying to Sit: Patient Percentage: 50% Supine to Sit: 1: +2 Total assist Supine to Sit: Patient Percentage: 40% Sit to Supine: 1: +2 Total assist Sit to Supine: Patient Percentage: 30% Transfers Transfers: Not assessed Sit to Stand: 1: +2 Total assist;From bed Sit to Stand: Patient Percentage: 40%      ADL: ADL Eating/Feeding: NPO Grooming: Set up;Wash/dry face;Wash/dry hands;Minimal assistance (set up face with LUE; minA to initiate hands) Where Assessed - Grooming: Supine, head of bed up Upper Body Bathing: Maximal assistance Where Assessed - Upper Body Bathing: Supine, head of bed up Lower Body Bathing: +1 Total assistance Where Assessed - Lower Body Bathing: Supine, head of bed up;Rolling right and/or left Upper Body Dressing: Maximal assistance Where Assessed - Upper Body Dressing: Supported sitting Lower Body Dressing: +2 Total assistance Where Assessed - Lower Body Dressing: Unsupported sit to stand Transfers/Ambulation Related to ADLs: when pt first sat up, expression funny. Unable to verbalize if dizzy.  BP 139/96.  Stood briefly with A x 2.  Unable to advance RLE, legs supported by bed ADL Comments: pt has decreased attention:  cued to continue to wash face.  Overattends to L side; when LUE engaged, can have her use R.  Hand over hand assist to initiate use of R for applying lotion.     Cognition: Cognition Overall Cognitive Status: Impaired/Different from baseline Arousal/Alertness: Awake/alert Orientation Level: Disoriented to person;Disoriented to place;Disoriented to time;Disoriented to situation Attention: Focused Focused Attention: Impaired Focused Attention Impairment: Verbal basic;Functional basic Memory: Impaired Memory Impairment: Storage deficit;Retrieval deficit;Decreased recall of new information Awareness: Impaired Awareness Impairment: Intellectual impairment;Emergent impairment;Anticipatory impairment Problem Solving: Impaired Problem Solving Impairment: Verbal basic;Functional basic Executive Function: Reasoning;Sequencing;Organizing;Initiating;Self Monitoring Reasoning: Impaired Reasoning Impairment: Verbal basic;Functional basic Sequencing: Impaired Sequencing Impairment: Verbal basic;Functional basic Organizing: Impaired Organizing Impairment: Verbal basic;Functional basic Initiating: Impaired Initiating Impairment: Verbal basic;Functional basic Self Monitoring: Impaired Self Monitoring Impairment: Verbal basic;Functional basic Behaviors: Impulsive;Perseveration Safety/Judgment: Impaired Cognition Arousal/Alertness: Awake/alert Behavior During Therapy: Impulsive Overall Cognitive Status: Impaired/Different from baseline Area of Impairment: Safety/judgement;Awareness;Problem solving;Orientation Orientation Level: Disoriented to;Place;Time;Situation Memory:  (unable to assess) Safety/Judgement: Decreased awareness of safety;Decreased awareness of deficits General Comments: pt has word finding difficulty.  She knows something is wrong "  with this leg (R).  States this hospital doesn't give you anything to eat.  Unable to understand that she needs a swallow study first.  Blood pressure 128/74, pulse 74, temperature 98.1 F (36.7 C), temperature source Oral, resp. rate 20, height 5\' 3"  (1.6 m), weight 96.6 kg (212 lb 15.4 oz), SpO2  100.00%. Physical Exam  Vitals reviewed. HENT:  Head: Normocephalic.  Eyes: EOM are normal.  Neck: Normal range of motion. Neck supple. No thyromegaly present.  Cardiovascular: Normal rate and regular rhythm.   Respiratory: Effort normal and breath sounds normal. No respiratory distress.  GI: Soft. Bowel sounds are normal. She exhibits no distension.  Neurological: She is alert.  Patient with limited awareness of her deficits. She was able to state her name but was easily distracted. She was very inconsistent with commands and directions. RUE grossly 4/5. RLE also 4/5 but inconsistent. Has 1/2 sensation on the right. Has right inattention. Displays automatic movement of the right side. Has difficulties with expressive language, appears apraxic, uses paraphrases at times. Sometime anxious  Skin: Skin is warm and dry.  Psychiatric:  Anxious but generally pleasant    Results for orders placed during the hospital encounter of 11/20/13 (from the past 24 hour(s))  BASIC METABOLIC PANEL     Status: Abnormal   Collection Time    11/24/13  5:08 AM      Result Value Range   Sodium 134 (*) 135 - 145 mEq/L   Potassium 4.1  3.5 - 5.1 mEq/L   Chloride 101  96 - 112 mEq/L   CO2 20  19 - 32 mEq/L   Glucose, Bld 114 (*) 70 - 99 mg/dL   BUN 9  6 - 23 mg/dL   Creatinine, Ser 4.09  0.50 - 1.10 mg/dL   Calcium 9.1  8.4 - 81.1 mg/dL   GFR calc non Af Amer 90 (*) >90 mL/min   GFR calc Af Amer >90  >90 mL/min   Mr [facility] Wo Contrast  11/22/2013   CLINICAL DATA:  Right-sided weakness.  Acute cerebral infarct.  EXAM: MRA HEAD WITHOUT CONTRAST  TECHNIQUE: Angiographic images of the Circle of Willis were obtained using MRA technique without intravenous contrast.  COMPARISON:  MRI brain 11/21/2013  FINDINGS: The internal carotid arteries demonstrate slight irregularity through the cavernous segments. A tiny inferior outpouching in the left posterior communicating artery likely represents a small  infundibulum. A follow-up exam at 1 year is recommended to assure stability and lack of growth.  There is mild narrowing of the A1 segments bilaterally. The right M1 segment is normal. There is some narrowing of the left M1 segment without a focal stenosis. A high-grade stenosis or focal occlusion is present proximally in the inferior posterior left M2 branch corresponding to the area of acute infarction. There is asymmetric attenuation of the more distal anterior superior left M2 branch. Minimal distal branch vessel disease is present on the right.  The vertebral arteries are codominant. The right PICA origin is visualized and normal. The left AICA is dominant. The basilar artery is within normal limits. Both posterior cerebral arteries originate from the basilar tip. There is mild attenuation of distal PCA branch vessels.  IMPRESSION: 1. High-grade stenosis or likely occlusion of the inferior posterior left M2 branch corresponding to the area of acute infarction. 2. Asymmetric distal small vessel disease in the more anterior and superior left M2 branch. 3. Mild atherosclerotic irregularity within the cavernous carotid arteries and A1 segments mild laterally.  4. Mild distal small vessel disease otherwise. 5. Tiny inferior outpouching at the left posterior communicating artery likely represents a small infundibulum. A follow-up exam at 1 year is recommended to assure stability and lack of growth.   Electronically Signed   By: Gennette Pac M.D.   On: 11/22/2013 11:57    Assessment/Plan: Diagnosis: left MCA infarct 1. Does the need for close, 24 hr/day medical supervision in concert with the patient's rehab needs make it unreasonable for this patient to be served in a less intensive setting? Yes 2. Co-Morbidities requiring supervision/potential complications: htn, depression, asthma 3. Due to bladder management, bowel management, safety, skin/wound care, disease management, medication administration, pain  management and patient education, does the patient require 24 hr/day rehab nursing? Yes 4. Does the patient require coordinated care of a physician, rehab nurse, PT (1-2 hrs/day, 5 days/week), OT (1-2 hrs/day, 5 days/week) and SLP (1-2 hrs/day, 5 days/week) to address physical and functional deficits in the context of the above medical diagnosis(es)? Yes Addressing deficits in the following areas: balance, endurance, locomotion, strength, transferring, bowel/bladder control, bathing, dressing, feeding, grooming, toileting, cognition, language, swallowing and psychosocial support 5. Can the patient actively participate in an intensive therapy program of at least 3 hrs of therapy per day at least 5 days per week? Yes 6. The potential for patient to make measurable gains while on inpatient rehab is excellent 7. Anticipated functional outcomes upon discharge from inpatient rehab are supervision with PT, supervision to min assist with OT, supervision to min assist with SLP. 8. Estimated rehab length of stay to reach the above functional goals is: 20 to 24 days 9. Does the patient have adequate social supports to accommodate these discharge functional goals? Yes and Potentially 10. Anticipated D/C setting: Home 11. Anticipated post D/C treatments: HH therapy 12. Overall Rehab/Functional Prognosis: excellent  RECOMMENDATIONS: This patient's condition is appropriate for continued rehabilitative care in the following setting: CIR Patient has agreed to participate in recommended program. Potentially (Pt lacks insight to make decisions on her own behalf at this point however given cognitive/language deficits) Note that insurance prior authorization may be required for reimbursement for recommended care.  Comment: Rehab RN to follow up.     Ranelle Oyster, MD, [place] [facility]     11/24/2013

## 2013-11-24 NOTE — Progress Notes (Signed)
I have left a message for pt's sister to contact me to discuss pt's rehab venue options depending on caregiver support. I will then seek [employer] approval pending that discussion. [phone]

## 2013-11-24 NOTE — Progress Notes (Signed)
Speech Language Pathology Treatment: Dysphagia;Cognitive-Linquistic  Patient Details Name: Andrea Mason MRN: [id number] DOB: June 26, 1951 Today's Date: 11/24/2013 Time: 8469-6295 SLP Time Calculation (min): 28 min  Assessment / Plan / Recommendation Clinical Impression  Treatment focused on further diagnostic/treatment of dysphagia and cognitive linguistic abilities.  Pt with multiple family members in room and getting her hair braided during SLP session.   Pt with nearly empty lunch tray in room with family indicating good tolerance of po, pt denies oral pocketing on right.  Observed her swallowing juice via cup and water via straw-swallow was timely with clear voice throughout.  Pt remains impulsive and therefore recommend to continue soft foods/thin liquids.  SLP provided pt with written copy of swallow precaution sign and read to her.  She did not read sign independently, suspect language and attention issues.   Pt perseverating on going home and states repeatedly that she "stays by herself".  Informed pt of need for therapy to strengthen and improve communication skills.  Inattention ongoing but pt will respond to direct verbal cueing to focus in short time frames.    Pt aware of her deficits after SLP pointed to her arm- but she required max cueing to recognize issue.  She will require ongoing awareness training to maximize rehab potential.     Pt able to state her birthday with verbal choice of 2 months, after which she was able to name date and year - significant delay noted.  Also pt verbalized that she had a "stroke" with providing visual cue for phonetic placement.  She was able to verbalize in complete sentences on the phone with her mother.  Speech pattern, inflection appears immature, ? Baseline.  Pt with phonemic paraphasia x1, stating she was wearing "glasses" when she indeed was wearing contacts.    Recommend to continue skilled SLP to maximize functional cognitive linguistic and  swallowing skills as pt is making slow and steady progress.     HPI HPI: Andrea Mason is an 62 y.o. female with a past medical history significant for HTN, obesity, depression, DJD, colonic polyps, primary hyperaldosteronism, admitted to [facility] due to confusion and syncope.  Pt found to have a left MCA CVA.  Diet is soft/thin and plans are for her to go to CIR.  Pt seen today to assess tolerate of po and for cognitive-linguistic tx.     Pertinent Vitals Afebrile, decreased  SLP Plan  Continue with current plan of care    Recommendations Diet recommendations: Dysphagia 3 (mechanical soft);Thin liquid Liquids provided via: Cup;Straw Medication Administration: Whole meds with puree Supervision: Patient able to self feed;Intermittent supervision to cue for compensatory strategies;Trained caregiver to feed patient Compensations: Slow rate;Small sips/bites;Check for pocketing (on right primarily) Postural Changes and/or Swallow Maneuvers: Seated upright 90 degrees;Upright 30-60 min after meal              General recommendations: Rehab consult Oral Care Recommendations: Oral care BID Follow up Recommendations: Inpatient Rehab Plan: Continue with current plan of care    GO     Mills Koller, MS [facility] SLP [phone]

## 2013-11-24 NOTE — Progress Notes (Signed)
Physical Therapy Treatment Patient Details Name: Andrea Mason MRN: [id number] DOB: Feb 15, 1951 Today's Date: 11/24/2013 Time: 0926-0950 PT Time Calculation (min): 24 min  PT Assessment / Plan / Recommendation  History of Present Illness Andrea Mason is an 62 y.o. female with a past medical history significant for HTN, obesity, depression, DJD, colonic polyps, primary hyperaldosteronism, admitted to [facility] due to confusion and syncope.  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 This involves the left posterior insula and parietal lobe. Small area of acute infarct in the head of the caudate on the left.    PT Comments   Progressing with mobility, strength and balance. Plan is possibly for transfer to CIR on today. Pt was able to walk in hallway with walker, although still with +2 assist. Still demonstrates flexion synergy patterns with attempts to perform isolated movements. Pt able to tend to R side (briefly) with multimodal cues.   Follow Up Recommendations  CIR     Does the patient have the potential to tolerate intense rehabilitation     Barriers to Discharge        Equipment Recommendations       Recommendations for Other Services    Frequency Min 4X/week   Progress towards PT Goals Progress towards PT goals: Progressing toward goals  Plan Current plan remains appropriate    Precautions / Restrictions Precautions Precautions: Fall Restrictions Weight Bearing Restrictions: No   Pertinent Vitals/Pain No c./o pain    Mobility  Bed Mobility Bed Mobility: Supine to Sit Supine to Sit: 3: Mod assist;HOB elevated Sit to Supine: 3: Mod assist Details for Bed Mobility Assistance: Pt is impulsive. Assist for trunk to upright and stabilization and for safety in general.  Transfers Transfers: Sit to Stand;Stand to Sit Sit to Stand: 1: +2 Total assist;From bed Sit to Stand: Patient Percentage: 70% Stand to Sit: 1: +2 Total assist;To  bed Stand to Sit: Patient Percentage: 70% Details for Transfer Assistance: Assist to rise, stabilize, control descent. Multimodal cues for safety, technique, hand placement on walker.  Ambulation/Gait Ambulation/Gait Assistance: 1: +2 Total assist Ambulation/Gait: Patient Percentage: 70% Ambulation Distance (Feet): 40 Feet Assistive device: Rolling walker Ambulation/Gait Assistance Details: Assist to stabilize/support pt and maintain R hand grip on walker. Increased assitance with turns, espcially to R side due to neglect. R LE externally rotated and decreased DF noted as well. slow gait speed Gait Pattern: Decreased dorsiflexion - right;Decreased weight shift to right    Exercises General Exercises - Lower Extremity Ankle Circles/Pumps: PROM;Right;5 reps;Supine Heel Slides: AAROM;Right;5 reps;Supine Hip ABduction/ADduction: AAROM;Right;10 reps;Supine Straight Leg Raises: AAROM;Right;Supine   PT Diagnosis:    PT Problem List:   PT Treatment Interventions:     PT Goals (current goals can now be found in the care plan section)    Visit Information  Last PT Received On: 11/24/13 Assistance Needed: +2 (safety) History of Present Illness: Andrea Mason is an 62 y.o. female with a past medical history significant for HTN, obesity, depression, DJD, colonic polyps, primary hyperaldosteronism, admitted to [facility] due to confusion and syncope.  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 This involves the left posterior insula and parietal lobe. Small area of acute infarct in the head of the caudate on the left.     Subjective Data      Cognition  Cognition Arousal/Alertness: Awake/Mason Behavior During Therapy: Impulsive Overall Cognitive Status: Impaired/Different from baseline Area of Impairment: Awareness;Problem  solving;Safety/judgement;Following commands;Memory Orientation Level: Place;Time;Situation Memory: Decreased short-term  memory Following Commands: Follows one step commands with increased time Safety/Judgement: Decreased awareness of safety;Decreased awareness of deficits Problem Solving: Requires tactile cues;Requires verbal cues;Difficulty sequencing General Comments: pt has word finding difficulty.  She knows something is wrong "with this leg (R).  States this hospital doesn't give you anything to eat.  Unable to understand that she needs a swallow study first.    Balance  Balance Balance Assessed: Yes Static Sitting Balance Static Sitting - Balance Support: Bilateral upper extremity supported;Feet supported Static Sitting - Level of Assistance: 5: Stand by assistance Static Sitting - Comment/# of Minutes: close guard for static stitting.  Dynamic Sitting Balance Dynamic Sitting - Balance Support: Bilateral upper extremity supported;Feet supported;Feet unsupported Dynamic Sitting - Level of Assistance: 4: Min assist Dynamic Sitting Balance - Compensations: Increased posterior sway with movement of LEs during MMT, exercises in sitting  End of Session PT - End of Session Activity Tolerance: Patient tolerated treatment well Patient left: in bed;with call bell/phone within reach;with family/visitor present Nurse Communication: Mobility status   GP     Andrea Mason, MPT Pager: [phone]

## 2013-11-24 NOTE — Discharge Summary (Signed)
Physician Discharge Summary  Andrea Mason WUJ:[id number] DOB: 1951/10/13 DOA: 11/20/2013  PCP: Oliver Barre, MD  Admit date: 11/20/2013 Discharge date: 11/24/2013  Recommendations for Outpatient Follow-up:  1. Followup with primary care doctor within 2 weeks of discharge 2. Followup with neurology within one month of discharge  Discharge Diagnoses:  Principal Problem:   Stroke Active Problems:   HYPERTENSION   RHINOSINUSITIS, CHRONIC   Intrinsic asthma, unspecified   Hypokalemia   Syncope   Other and unspecified hyperlipidemia   Discharge Condition: Stable, improved  Diet recommendation: Dysphasia 3 within liquids, healthy heart  Wt Readings from Last 3 Encounters:  11/20/13 96.6 kg (212 lb 15.4 oz)  11/14/13 99.338 kg (219 lb)  10/24/13 100.245 kg (221 lb)    History of present illness:  Andrea Mason is a 62 y.o. female history of asthma, allergic sinusitis and recently being worked up for hypokalemia and patient was placed on spironolactone ( probably for hyperaldosteronism by nephrologist and have no access to nephrologist records) was brought to the ER after patient had a syncopal episode. As per the patient on 5 PM patient was getting ready to go out when patient felt something funny in her left hand and later on patient thinks she may have passed out. She did not have any tongue bite or incontinence of urine. After the incident she walked out and went to her neighbors and eventually patient was brought to the ER. CT head did not show any acute. EKG did not show any acute. Patient in the ER had 2 episodes of syncope which lasted for less than a minute. Both the time patient was trying to walk. Patient during those episodes did not have any seizure-like activities or tongue bite or incontinence of urine. On my exam patient presently is alert awake oriented to her name and place and time but looks confused and patient's family also states that she was confused. Patient denies any  headache chest pain palpitation shortness of breath nausea vomiting abdominal pain diarrhea fever chills. Patient states that of recently over the last one week she has stopped taking tramadol because it was making her feel uneasy. Patient was recently placed on Augmentin and prednisone for sinusitis.  Hospital Course:  Stroke:  Andrea Mason presented with syncope, confused speech, and difficulty moving her right extremities.  She was found to have an acute left MCA territory stroke involving the insula and parietal lobe and small area in the head of the caudate on the left.  MRA demonstrated high grade stenosis or occlusion of the inferior posterior left M2 branch corresponding with the area of acute infarction.  She was seen by neurology who recommended a full dose aspirin and risk factor modification.  Her telemetry demonstrated NSR, carotid duplex < 40% stenosis bilaterall, antegrade vertebral artery flow, EEG with slowing in the left temporal region without epileptiform activity, ECHO: mild LVH, EF of 50-55%, no PFO identified.  Her cholesterol panel was at goal but she was started on atorvastatin per new AHA guilelines.  Hemoglobin A1c was 6.1.  Her blood pressure was mildly elevated while holding spironolactone but trended down to normal limits after restarting.  She was seen by physical and occupational therapy who recommended CIR.    Acute sinusitis, improving.  She was continued on antibiotics during this admission and will have completed a 7 day course of antibiotics at the time of discharge.    Hypokalemia, resolved with supplementation. Likely related to hyperaldosteronemia.  Her spironolactone was initially held but  then restarted.  Her potassium is wnl.    Asthma, stable, continue albuterol prn.    HTN, blood pressure elevated initially but normalized after restarting spironolactone.    Fall, slid to floor and hit bottom but did not hit head. Family and RN were in room when incident  happened.  No pain of limbs or pelvic instability.  Consultants:  Neurology Procedures:  EEG  MRI brain MRA brain ECHO Carotid duplex Antibiotics:  augmentin 12/18 >> 12/20, restarted 12/21  Ceftriaxone 12/20 >> 12/21  Discharge Exam: Filed Vitals:   11/24/13 1519  BP: 137/75  Pulse: 89  Temp: 97.7 F (36.5 C)  Resp: 20   Filed Vitals:   11/23/13 2043 11/23/13 2205 11/24/13 0601 11/24/13 1519  BP:  150/89 128/74 137/75  Pulse:  107 74 89  Temp:  97.2 F (36.2 C) 98.1 F (36.7 C) 97.7 F (36.5 C)  TempSrc:  Oral Oral Oral  Resp:  18 20 20   Height:      Weight:      SpO2: 98% 100% 100% 100%    General: AAF, No acute distress  HEENT: NCAT, MMM  Cardiovascular: RRR, nl S1, S2 no mrg, 2+ pulses, warm extremities  Respiratory: CTAB, no increased WOB  Abdomen: NABS, soft, NT/ND  MSK: Normal tone and bulk, no LEE  Neuro: 4/5 right hand and right leg strength with some neglect of the right side. Tends to lean to the right side in bed. Possibly diminished sensation of the right hand and leg also. Still confused and having difficulty following commands, but speech is more fluent today  Discharge Instructions      Discharge Orders   Future Appointments Provider Department Dept Phone   12/12/2013 2:00 PM Julio Sicks, NP [facility] [phone]   Future Orders Complete By Expires   Call MD for:  difficulty breathing, headache or visual disturbances  As directed    Call MD for:  extreme fatigue  As directed    Call MD for:  hives  As directed    Call MD for:  persistant dizziness or light-headedness  As directed    Call MD for:  persistant nausea and vomiting  As directed    Call MD for:  severe uncontrolled pain  As directed    Call MD for:  temperature >100.4  As directed    Diet - low sodium heart healthy  As directed    Discharge instructions  As directed    Comments:     Andrea Mason was hospitalized with confusion and was found to have a stroke.  She  was started on full dose aspirin 325mg  daily and cholesterol medication atorvastatin 20mg  daily.  She should follow up with neurology within 1-2 months.   Increase activity slowly  As directed        Medication List    STOP taking these medications       amoxicillin-clavulanate 875-125 MG per tablet  Commonly known as:  AUGMENTIN     cyclobenzaprine 5 MG tablet  Commonly known as:  FLEXERIL     guaiFENesin 600 MG 12 hr tablet  Commonly known as:  MUCINEX     HYDROcodone-homatropine 5-1.5 MG/5ML syrup  Commonly known as:  HYCODAN     oxymetazoline 0.05 % nasal spray  Commonly known as:  AFRIN      TAKE these medications       albuterol 108 (90 BASE) MCG/ACT inhaler  Commonly known as:  PROAIR HFA  Inhale 2 puffs into the lungs every 6 (six) hours as needed for wheezing. 2 puffs every 4 hours as needed only  if your can't catch your breath     aspirin 325 MG EC tablet  Take 1 tablet (325 mg total) by mouth daily.     atorvastatin 20 MG tablet  Commonly known as:  LIPITOR  Take 1 tablet (20 mg total) by mouth daily at 6 PM.     Biotin 5000 MCG Caps  Take 1 capsule by mouth daily.     budesonide-formoterol 80-4.5 MCG/ACT inhaler  Commonly known as:  SYMBICORT  Take 2 puffs first thing in am and then another 2 puffs about 12 hours later.     cetirizine 10 MG tablet  Commonly known as:  ZYRTEC  Take 10 mg by mouth at bedtime.     fluticasone 50 MCG/ACT nasal spray  Commonly known as:  FLONASE  Place 2 sprays into both nostrils 2 (two) times daily.     montelukast 10 MG tablet  Commonly known as:  SINGULAIR  Take 1 tablet (10 mg total) by mouth at bedtime.     omeprazole 20 MG capsule  Commonly known as:  PRILOSEC  Take 1 capsule (20 mg total) by mouth every morning.     ranitidine 150 MG tablet  Commonly known as:  ZANTAC  Take 150 mg by mouth 2 (two) times daily.     sodium chloride 0.65 % nasal spray  Commonly known as:  OCEAN  Place 2 sprays into the  nose as needed for congestion.     spironolactone 25 MG tablet  Commonly known as:  ALDACTONE  Take 25 mg by mouth daily.     traMADol 50 MG tablet  Commonly known as:  ULTRAM  1-2 every 4 hours as needed for cough or pain     zinc gluconate 50 MG tablet  Take 50 mg by mouth daily.       Follow-up Information   Follow up with Oliver Barre, MD. Schedule an appointment as soon as possible for a visit in 2 weeks.   Specialties:  Internal Medicine, Radiology   Contact information:   [address]omasa Bl[address] [place] [address] [phone]       Follow up with Gates Rigg, MD. Schedule an appointment as soon as possible for a visit in 1 month.   Specialties:  Neurology, Radiology   Contact information:   [address] [place] [address] [phone]       The results of significant diagnostics from this hospitalization (including imaging, microbiology, ancillary and laboratory) are listed below for reference.    Significant Diagnostic Studies: Ct Head Wo Contrast  11/20/2013   CLINICAL DATA:  Altered mental status; syncope  EXAM: CT HEAD WITHOUT CONTRAST  TECHNIQUE: Contiguous axial images were obtained from the base of the skull through the vertex without intravenous contrast. Study was obtained within 24 hr of patient's arrival at the emergency department.  COMPARISON:  October 09, 2011  FINDINGS: The ventricles are normal in size and configuration. There is no mass, hemorrhage, extra-axial fluid collection, or midline shift. Gray-white compartments are normal. There is no demonstrable acute infarct.  Bony calvarium appears intact. The mastoid air cells are clear. There is diffuse opacification of all paranasal sinuses.  IMPRESSION: Pansinusitis, extensive.  Study otherwise unremarkable.   Electronically Signed   By: Bretta Bang M.D.   On: 11/20/2013 18:27   Mr [facility]  Wo Contrast  11/22/2013   CLINICAL DATA:  Right-sided weakness.  Acute cerebral  infarct.  EXAM: MRA HEAD WITHOUT CONTRAST  TECHNIQUE: Angiographic images of the Circle of Willis were obtained using MRA technique without intravenous contrast.  COMPARISON:  MRI brain 11/21/2013  FINDINGS: The internal carotid arteries demonstrate slight irregularity through the cavernous segments. A tiny inferior outpouching in the left posterior communicating artery likely represents a small infundibulum. A follow-up exam at 1 year is recommended to assure stability and lack of growth.  There is mild narrowing of the A1 segments bilaterally. The right M1 segment is normal. There is some narrowing of the left M1 segment without a focal stenosis. A high-grade stenosis or focal occlusion is present proximally in the inferior posterior left M2 branch corresponding to the area of acute infarction. There is asymmetric attenuation of the more distal anterior superior left M2 branch. Minimal distal branch vessel disease is present on the right.  The vertebral arteries are codominant. The right PICA origin is visualized and normal. The left AICA is dominant. The basilar artery is within normal limits. Both posterior cerebral arteries originate from the basilar tip. There is mild attenuation of distal PCA branch vessels.  IMPRESSION: 1. High-grade stenosis or likely occlusion of the inferior posterior left M2 branch corresponding to the area of acute infarction. 2. Asymmetric distal small vessel disease in the more anterior and superior left M2 branch. 3. Mild atherosclerotic irregularity within the cavernous carotid arteries and A1 segments mild laterally. 4. Mild distal small vessel disease otherwise. 5. Tiny inferior outpouching at the left posterior communicating artery likely represents a small infundibulum. A follow-up exam at 1 year is recommended to assure stability and lack of growth.   Electronically Signed   By: Gennette Pac M.D.   On: 11/22/2013 11:57   Mr Brain Wo Contrast  11/21/2013   CLINICAL DATA:   Seizure.  History of colon cancer.  EXAM: MRI HEAD WITHOUT CONTRAST  TECHNIQUE: Multiplanar, multiecho pulse sequences of the brain and surrounding structures were obtained without intravenous contrast.  COMPARISON:  CT head 11/20/2013  FINDINGS: Sagittal T1 and diffusion-weighted imaging was performed. The patient was unable to hold still and tolerate further imaging.  Acute infarct in the left posterior insula and left parietal lobe consistent with MCA infarction. Small area of acute infarct in the head of the caudate on the left.  Ventricle size is normal.  No midline shift.  Extensive mucosal edema throughout the paranasal sinuses as noted on CT.  IMPRESSION: Acute infarct left MCA territory. This involves the left posterior insula and parietal lobe. Small area of acute infarct in the head of the caudate on the left.   Electronically Signed   By: Marlan Palau M.D.   On: 11/21/2013 13:52    Microbiology: No results found for this or any previous visit (from the past 240 hour(s)).   Labs: Basic Metabolic Panel:  Recent Labs Lab 11/20/13 1641 11/21/13 0830 11/22/13 0545 11/24/13 0508  NA 136 138 136 134*  K 3.1* 3.9 3.2* 4.1  CL 98 101 102 101  CO2 28 26 21 20   GLUCOSE 144* 80 99 114*  BUN 9 7 9 9   CREATININE 0.83 0.77 0.70 0.76  CALCIUM 9.0 9.4 8.3* 9.1  MG  --  2.1  --   --    Liver Function Tests:  Recent Labs Lab 11/20/13 1641 11/21/13 0830  AST 16 12  ALT 11 11  ALKPHOS 118* 124*  BILITOT  0.6 1.1  PROT 7.5 7.9  ALBUMIN 3.5 3.8   No results found for this basename: LIPASE, AMYLASE,  in the last 168 hours No results found for this basename: AMMONIA,  in the last 168 hours CBC:  Recent Labs Lab 11/20/13 1641 11/21/13 0830 11/22/13 0545  WBC 7.7 7.9 7.7  NEUTROABS 4.3 4.5 5.0  HGB 12.9 13.8 12.5  HCT 38.2 40.2 36.6  MCV 86.8 87.0 85.5  PLT 251 248 234   Cardiac Enzymes:  Recent Labs Lab 11/22/13 0545  TROPONINI <0.30   BNP: BNP (last 3 results) No  results found for this basename: PROBNP,  in the last 8760 hours CBG:  Recent Labs Lab 11/22/13 0326  GLUCAP 89    Time coordinating discharge: 45 minutes  Signed:  SHORTThea Silversmith  [facility] 11/24/2013, 5:48 PM

## 2013-11-24 NOTE — Progress Notes (Signed)
Occupational Therapy Treatment Patient Details Name: Andrea Mason MRN: [id number] DOB: 04-Jun-1951 Today's Date: 11/24/2013 Time: 4098-1191 OT Time Calculation (min): 44 min  OT Assessment / Plan / Recommendation  History of present illness Andrea Mason is an 62 y.o. female with a past medical history significant for HTN, obesity, depression, DJD, colonic polyps, primary hyperaldosteronism, admitted to [facility] due to confusion and syncope.  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 This involves the left posterior insula and parietal lobe. Small area of acute infarct in the head of the caudate on the left.    OT comments  Pt needed MAX VC- but did well following directions getting to 3n1.  Pt with decreased sensation and awareness of R side- and RUE.  educated pts friend on safety with RUE and having pt look at RUE with eyes to make sure it is safe  Follow Up Recommendations  CIR       Equipment Recommendations   (to be further assessed:  likely 3:1 when ready)    Recommendations for Other Services Rehab consult  Frequency Min 3X/week         Precautions / Restrictions Precautions Precautions: Fall Restrictions Weight Bearing Restrictions: No       ADL  Grooming: Brushing hair;Wash/dry face;Teeth care;Moderate assistance;Other (comment) (MAX cues to incorporate RUE and pt is R handed) Where Assessed - Grooming: Supine, head of bed up Toilet Transfer: +2 Total assistance Toilet Transfer: Patient Percentage: 60% Toilet Transfer Method: Sit to stand;Stand pivot Toilet Transfer Equipment: Bedside commode Toileting - Clothing Manipulation and Hygiene: +2 Total assistance Toileting - Clothing Manipulation and Hygiene: Patient Percentage: 60% Where Assessed - Toileting Clothing Manipulation and Hygiene: Standing ADL Comments: pt has decreased attention:  cued to continue to wash face.  Overattends to L side; when LUE engaged, can have her  use R. Pt needed MAX cues    OT Diagnosis: Hemiplegia dominant side  OT Problem List: Decreased strength;Decreased activity tolerance;Decreased range of motion;Impaired balance (sitting and/or standing);Impaired vision/perception;Decreased coordination;Decreased safety awareness;Impaired sensation;Impaired UE functional use OT Treatment Interventions: Self-care/ADL training;Therapeutic exercise;Neuromuscular education;DME and/or AE instruction;Therapeutic activities;Cognitive remediation/compensation;Visual/perceptual remediation/compensation;Patient/family education;Balance training   OT Goals(current goals can now be found in the care plan section)    Visit Information  Assistance Needed: +2 (safety) History of Present Illness: Andrea Mason is an 62 y.o. female with a past medical history significant for HTN, obesity, depression, DJD, colonic polyps, primary hyperaldosteronism, admitted to [facility] due to confusion and syncope.  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 This involves the left posterior insula and parietal lobe. Small area of acute infarct in the head of the caudate on the left.           Cognition  Cognition Arousal/Alertness: Awake/alert Behavior During Therapy: Impulsive Overall Cognitive Status: Impaired/Different from baseline Area of Impairment: Awareness;Problem solving;Safety/judgement;Following commands;Memory Orientation Level: Place;Time;Situation Memory: Decreased short-term memory Following Commands: Follows one step commands with increased time Safety/Judgement: Decreased awareness of safety;Decreased awareness of deficits Problem Solving: Requires tactile cues;Requires verbal cues;Difficulty sequencing General Comments: pt has word finding difficulty.  says " that arm doesnt work- can I get a new one? "    Mobility  Bed Mobility Bed Mobility: Supine to Sit Supine to Sit: 3: Mod assist;HOB elevated Sit to  Supine: 3: Mod assist Details for Bed Mobility Assistance: Pt is impulsive. Assist for trunk to upright and stabilization and for safety in general.  Transfers Transfers: Sit to Stand Sit to Stand: 1: +2 Total assist;From bed;From chair/3-in-1 Sit to Stand: Patient Percentage: 60% Stand to Sit: 1: +2 Total assist;To bed;To chair/3-in-1 Stand to Sit: Patient Percentage: 60% Details for Transfer Assistance: Assist to rise, stabilize, control descent. Multimodal cues for safety, technique, hand placement on walker. Decreased sensation in RUE very limiting with transfer, reaching back, knowing where hand is in space    Exercises  General Exercises - Lower Extremity Ankle Circles/Pumps: PROM;Right;5 reps;Supine Heel Slides: AAROM;Right;5 reps;Supine Hip ABduction/ADduction: AAROM;Right;10 reps;Supine Straight Leg Raises: AAROM;Right;Supine   Balance Balance Balance Assessed: Yes Static Sitting Balance Static Sitting - Balance Support: Bilateral upper extremity supported;Feet supported Static Sitting - Level of Assistance: 5: Stand by assistance Static Sitting - Comment/# of Minutes: close guard for static stitting.  Dynamic Sitting Balance Dynamic Sitting - Balance Support: Bilateral upper extremity supported;Feet supported;Feet unsupported Dynamic Sitting - Level of Assistance: 4: Min assist Dynamic Sitting Balance - Compensations: Increased posterior sway with movement of LEs during MMT, exercises in sitting   End of Session OT - End of Session Activity Tolerance: Patient tolerated treatment well Patient left: in bed;with call bell/phone within reach;with bed alarm set  GO     Andrea Mason, [place] Kung 11/24/2013, 12:31 PM

## 2013-11-24 NOTE — Progress Notes (Addendum)
NEURO HOSPITALIST PROGRESS NOTE   SUBJECTIVE:                                                                                                                        Patient is doing well per sister in the room.  She continues to have difficulty following complex commands.  Plan is for In patient rehab if qualifies.   OBJECTIVE:                                                                                                                           Vital signs in last 24 hours: Temp:  [97.2 F (36.2 C)-98.1 F (36.7 C)] 98.1 F (36.7 C) (12/22 0601) Pulse Rate:  [74-107] 74 (12/22 0601) Resp:  [18-20] 20 (12/22 0601) BP: (128-150)/(74-91) 128/74 mmHg (12/22 0601) SpO2:  [98 %-100 %] 100 % (12/22 0601)  Intake/Output from previous day: 12/21 0701 - 12/22 0700 In: 3055 [P.O.:1680; I.V.:1325; IV Piggyback:50] Out: 1700 [Urine:1700] Intake/Output this shift: Total I/O In: -  Out: 400 [Urine:400] Nutritional status: Dysphagia  Past Medical History  Diagnosis Date  . Hypertension   . GERD (gastroesophageal reflux disease)   . Allergic rhinitis   . Asthma   . Obesity   . Depression   . History of colonic polyps   . DJD (degenerative joint disease), cervical   . Primary hyperaldosteronism 11/06/2013      Neurologic Exam:  Mental Status: Alert, oriented,  Speech fluent with evidence of receptive aphasia.  Able to follow simple commands but needs visual prompting and at times still has problems with this.  Cranial Nerves: II: Visual fields shows a left hemianopsia, pupils equal, round, reactive to light and accommodation III,IV, VI: ptosis not present, extra-ocular motions intact bilaterally V,VII: smile asymmetric on the right, facial light touch sensation decreased on the right VIII: hearing normal bilaterally IX,X: gag reflex present XI: bilateral shoulder shrug XII: midline tongue extension without atrophy or  fasciculations  Motor: Right : Upper extremity   5/5 prox, 4/5 distal  Left:     Upper extremity   5/5  Lower extremity   4/5     Lower extremity   5/5 Tone and bulk:normal tone throughout; no atrophy noted Sensory: Pinprick  and light touch intact throughout but neglects the right side.  Deep Tendon Reflexes:  Right: Upper Extremity   Left: Upper extremity   biceps (C-5 to C-6) 2/4   biceps (C-5 to C-6) 2/4 tricep (C7) 2/4    triceps (C7) 2/4 Brachioradialis (C6) 2/4  Brachioradialis (C6) 2/4  Lower Extremity Lower Extremity  quadriceps (L-2 to L-4) 2/4   quadriceps (L-2 to L-4) 2/4 Achilles (S1) 1/4   Achilles (S1) 1/4  Plantars: equivocal bilaterally Cerebellar: normal finger-to-nose,  normal heel-to-shin test    Lab Results: Lab Results  Component Value Date/Time   CHOL 80 11/22/2013  5:45 AM   Lipid Panel  Recent Labs  11/22/13 0545  CHOL 80  TRIG 53  HDL 46  CHOLHDL 1.7  VLDL 11  LDLCALC 23    Studies/Results: [facility] Contrast  11/22/2013   CLINICAL DATA:  Right-sided weakness.  Acute cerebral infarct.  EXAM: MRA HEAD WITHOUT CONTRAST  TECHNIQUE: Angiographic images of the Circle of Willis were obtained using MRA technique without intravenous contrast.  COMPARISON:  MRI brain 11/21/2013  FINDINGS: The internal carotid arteries demonstrate slight irregularity through the cavernous segments. A tiny inferior outpouching in the left posterior communicating artery likely represents a small infundibulum. A follow-up exam at 1 year is recommended to assure stability and lack of growth.  There is mild narrowing of the A1 segments bilaterally. The right M1 segment is normal. There is some narrowing of the left M1 segment without a focal stenosis. A high-grade stenosis or focal occlusion is present proximally in the inferior posterior left M2 branch corresponding to the area of acute infarction. There is asymmetric attenuation of the more distal anterior superior left  M2 branch. Minimal distal branch vessel disease is present on the right.  The vertebral arteries are codominant. The right PICA origin is visualized and normal. The left AICA is dominant. The basilar artery is within normal limits. Both posterior cerebral arteries originate from the basilar tip. There is mild attenuation of distal PCA branch vessels.  IMPRESSION: 1. High-grade stenosis or likely occlusion of the inferior posterior left M2 branch corresponding to the area of acute infarction. 2. Asymmetric distal small vessel disease in the more anterior and superior left M2 branch. 3. Mild atherosclerotic irregularity within the cavernous carotid arteries and A1 segments mild laterally. 4. Mild distal small vessel disease otherwise. 5. Tiny inferior outpouching at the left posterior communicating artery likely represents a small infundibulum. A follow-up exam at 1 year is recommended to assure stability and lack of growth.   Electronically Signed   By: Gennette Pac M.D.   On: 11/22/2013 11:57    MEDICATIONS                                                                                                                        Scheduled: . amoxicillin-clavulanate  1 tablet Oral BID  . aspirin EC  325 mg Oral Daily  . atorvastatin  20  mg Oral q1800  . budesonide-formoterol  2 puff Inhalation BID  . enoxaparin (LOVENOX) injection  40 mg Subcutaneous Q24H  . fluticasone  2 spray Each Nare BID  . pantoprazole (PROTONIX) IV  40 mg Intravenous Q24H  . sodium chloride  3 mL Intravenous Q12H  . spironolactone  12.5 mg Oral Daily   2 D echo Study Conclusions  - Left ventricle: Posterior basal wall hypokinesis The cavity size was normal. Wall thickness was increased in a pattern of mild LVH. Systolic function was normal. The estimated ejection fraction was in the range of 50% to 55%. - Mitral valve: Mild regurgitation. - Atrial septum: No defect or patent foramen ovale was Identified  Vascular  Ultrasound  Carotid Duplex (Doppler) has been completed.  Findings suggest 1-39% internal carotid artery stenosis bilaterally. Vertebral arteries are patent with antegrade flow.  A1c--6.1 LDL--23     ASSESSMENT/PLAN:                                                                                                            62 YO female with a left MCA infarct. Echo and carotid dopplers WNL, LGL 23 and A1c 6.1.  Patient has been started on ASA and Lipitor.    Recommend: 1) Continue ASA daily 2) Continue stroke risk factor modification 2) Agree with CIR if she qualifies  No further recommendations at this time. S/O  Assessment and plan discussed with with attending physician and they are in agreement.    Felicie Morn PA-C [facility] [phone]  11/24/2013, 10:05 AM

## 2013-11-25 ENCOUNTER — Inpatient Hospital Stay (HOSPITAL_COMMUNITY)
Admission: RE | Admit: 2013-11-25 | Discharge: 2013-12-10 | DRG: 945 | Disposition: A | Discharge status: Home / Self Care | Payer: BC Managed Care – PPO | Source: Intra-hospital | Attending: Physical Medicine & Rehabilitation | Admitting: Physical Medicine & Rehabilitation

## 2013-11-25 DIAGNOSIS — J45909 Unspecified asthma, uncomplicated: Secondary | ICD-10-CM | POA: Diagnosis present

## 2013-11-25 DIAGNOSIS — I10 Essential (primary) hypertension: Secondary | ICD-10-CM

## 2013-11-25 DIAGNOSIS — I6330 Cerebral infarction due to thrombosis of unspecified cerebral artery: Secondary | ICD-10-CM

## 2013-11-25 DIAGNOSIS — E669 Obesity, unspecified: Secondary | ICD-10-CM | POA: Diagnosis present

## 2013-11-25 DIAGNOSIS — I639 Cerebral infarction, unspecified: Secondary | ICD-10-CM | POA: Diagnosis present

## 2013-11-25 DIAGNOSIS — I6340 Cerebral infarction due to embolism of unspecified cerebral artery: Secondary | ICD-10-CM | POA: Diagnosis present

## 2013-11-25 DIAGNOSIS — R1310 Dysphagia, unspecified: Secondary | ICD-10-CM | POA: Diagnosis present

## 2013-11-25 DIAGNOSIS — R350 Frequency of micturition: Secondary | ICD-10-CM | POA: Diagnosis present

## 2013-11-25 DIAGNOSIS — E785 Hyperlipidemia, unspecified: Secondary | ICD-10-CM | POA: Diagnosis present

## 2013-11-25 DIAGNOSIS — Z5189 Encounter for other specified aftercare: Principal | ICD-10-CM

## 2013-11-25 DIAGNOSIS — J019 Acute sinusitis, unspecified: Secondary | ICD-10-CM | POA: Diagnosis present

## 2013-11-25 DIAGNOSIS — K219 Gastro-esophageal reflux disease without esophagitis: Secondary | ICD-10-CM | POA: Diagnosis present

## 2013-11-25 DIAGNOSIS — F3289 Other specified depressive episodes: Secondary | ICD-10-CM | POA: Diagnosis present

## 2013-11-25 DIAGNOSIS — Z800 Family history of malignant neoplasm of digestive organs: Secondary | ICD-10-CM

## 2013-11-25 DIAGNOSIS — F329 Major depressive disorder, single episode, unspecified: Secondary | ICD-10-CM | POA: Diagnosis present

## 2013-11-25 DIAGNOSIS — R4701 Aphasia: Secondary | ICD-10-CM | POA: Diagnosis present

## 2013-11-25 LAB — CREATININE, SERUM: GFR calc Af Amer: 86 mL/min — ABNORMAL LOW (ref >90)

## 2013-11-25 LAB — CBC
Hemoglobin: 13.4 g/dL (ref 12.0–15.0)
MCH: 30.5 pg (ref 26.0–34.0)
MCHC: 34.4 g/dL (ref 30.0–36.0)
MCV: 88.6 fL (ref 78.0–100.0)
Platelets: 252 10*3/uL (ref 150–400)
RBC: 4.39 MIL/uL (ref 3.87–5.11)
WBC: 7.8 10*3/uL (ref 4.0–10.5)

## 2013-11-25 MED ORDER — ONDANSETRON HCL 4 MG/2ML IJ SOLN: 4.0000 mg | Freq: Four times a day (QID) | INTRAMUSCULAR | Status: DC | PRN

## 2013-11-25 MED ORDER — PANTOPRAZOLE SODIUM 40 MG IV SOLR
40.0000 mg | INTRAVENOUS | Status: DC
  Filled 2013-11-25 (×2): qty 40

## 2013-11-25 MED ORDER — SORBITOL 70 % SOLN
30.0000 mL | Freq: Every day | Status: DC | PRN
  Administered 2013-11-25 – 2013-12-07 (×4): 30 mL via ORAL
  Filled 2013-11-25 (×4): qty 30

## 2013-11-25 MED ORDER — ACETAMINOPHEN 325 MG PO TABS
650.0000 mg | ORAL_TABLET | Freq: Four times a day (QID) | ORAL | Status: DC | PRN
  Administered 2013-12-02: 650 mg via ORAL
  Filled 2013-11-25: qty 2

## 2013-11-25 MED ORDER — ATORVASTATIN CALCIUM 20 MG PO TABS
20.0000 mg | ORAL_TABLET | Freq: Every day | ORAL | Status: DC
  Administered 2013-11-25 – 2013-12-09 (×15): 20 mg via ORAL
  Filled 2013-11-25 (×16): qty 1

## 2013-11-25 MED ORDER — SPIRONOLACTONE 12.5 MG HALF TABLET
12.5000 mg | ORAL_TABLET | Freq: Every day | ORAL | Status: DC
  Administered 2013-11-26 – 2013-12-10 (×15): 12.5 mg via ORAL
  Filled 2013-11-25 (×17): qty 1

## 2013-11-25 MED ORDER — ONDANSETRON HCL 4 MG PO TABS: 4.0000 mg | ORAL_TABLET | Freq: Four times a day (QID) | ORAL | Status: DC | PRN

## 2013-11-25 MED ORDER — LORAZEPAM 2 MG/ML IJ SOLN
0.5000 mg | Freq: Once | INTRAMUSCULAR | Status: DC
  Filled 2013-11-25: qty 1

## 2013-11-25 MED ORDER — ENOXAPARIN SODIUM 40 MG/0.4ML ~~LOC~~ SOLN
40.0000 mg | SUBCUTANEOUS | Status: DC
  Administered 2013-11-25 – 2013-12-09 (×15): 40 mg via SUBCUTANEOUS
  Filled 2013-11-25 (×16): qty 0.4

## 2013-11-25 MED ORDER — ASPIRIN EC 325 MG PO TBEC
325.0000 mg | DELAYED_RELEASE_TABLET | Freq: Every day | ORAL | Status: DC
  Administered 2013-11-26 – 2013-12-10 (×15): 325 mg via ORAL
  Filled 2013-11-25 (×17): qty 1

## 2013-11-25 MED ORDER — FLUTICASONE PROPIONATE 50 MCG/ACT NA SUSP
2.0000 | Freq: Two times a day (BID) | NASAL | Status: DC
  Administered 2013-11-25 – 2013-12-10 (×27): 2 via NASAL
  Filled 2013-11-25: qty 16

## 2013-11-25 MED ORDER — BUDESONIDE-FORMOTEROL FUMARATE 80-4.5 MCG/ACT IN AERO
2.0000 | INHALATION_SPRAY | Freq: Two times a day (BID) | RESPIRATORY_TRACT | Status: DC
  Administered 2013-11-25 – 2013-12-10 (×26): 2 via RESPIRATORY_TRACT
  Filled 2013-11-25 (×2): qty 6.9

## 2013-11-25 MED ORDER — ENOXAPARIN SODIUM 40 MG/0.4ML ~~LOC~~ SOLN: 40.0000 mg | SUBCUTANEOUS | Status: DC

## 2013-11-25 MED ORDER — ACETAMINOPHEN 650 MG RE SUPP: 650.0000 mg | Freq: Four times a day (QID) | RECTAL | Status: DC | PRN

## 2013-11-25 MED ORDER — ALBUTEROL SULFATE HFA 108 (90 BASE) MCG/ACT IN AERS: 2.0000 | INHALATION_SPRAY | Freq: Four times a day (QID) | RESPIRATORY_TRACT | Status: DC | PRN

## 2013-11-25 NOTE — H&P (Signed)
Physical Medicine and Rehabilitation Admission H&P    Chief Complaint  Patient presents with  . Altered Mental Status  :  Chief complaint: Right side weakness  HPI: Andrea Mason is a 62 y.o. right-handed female with history of hypertension and asthma. Admitted 11/20/2013 with altered mental status/questionable syncopal episode and right side weakness with aphasia. MRI of the brain showed acute infarct left MCA territory as well small area of acute infarct head of the caudate on the left. MRA of the head with high grade stenosis or likely occlusion of the inferior posterior left M2 branch corresponding to the area of acute infarction. Echocardiogram with ejection fraction of 55% normal systolic function. Carotid Dopplers negative for ICA stenosis. EEG with no seizure activity noted. Neurology services consulted. Patient did not receive TPA. Aspirin added for CVA prophylaxis. Subcutaneous Lovenox for DVT prophylaxis. Placed on Augmentin for questionable sinusitis and has since been completed. Physical and occupational therapy evaluations completed 11/22/2013 with recommendations of physical medicine rehabilitation consult to consider inpatient rehabilitation services. Patient was felt to be a good candidate for inpatient rehabilitation services was admitted for comprehensive rehabilitation program   ROS Review of Systems  Gastrointestinal:  GERD  Musculoskeletal: Positive for myalgias.  Psychiatric/Behavioral: Positive for depression.  All other systems reviewed and are negative  Past Medical History  Diagnosis Date  . Hypertension   . GERD (gastroesophageal reflux disease)   . Allergic rhinitis   . Asthma   . Obesity   . Depression   . History of colonic polyps   . DJD (degenerative joint disease), cervical   . 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 Nasal sinus surgery  07/2006    Dr. Shoemaker  . Nasal polyp surgery  07/2006    x  2 with Dr. Shoemaker  . Foot surgery  1998  . Tubal ligation     Family History  Problem Relation Age of Onset  . Colon cancer Brother    Social History:  reports that she has never smoked. She has never used smokeless tobacco. She reports that she does not drink alcohol or use illicit drugs. Allergies:  Allergies  Allergen Reactions  . Ivp Dye [Iodinated Diagnostic Agents] Nausea And Vomiting    Reaction: hot flashes  . Promethazine-Codeine Nausea And Vomiting   Medications Prior to Admission  Medication Sig Dispense Refill  . albuterol (PROAIR HFA) 108 (90 BASE) MCG/ACT inhaler Inhale 2 puffs into the lungs every 6 (six) hours as needed for wheezing. 2 puffs every 4 hours as needed only  if your can't catch your breath  1 Inhaler  2  . amoxicillin-clavulanate (AUGMENTIN) 875-125 MG per tablet Take 1 tablet by mouth 2 (two) times daily.  20 tablet  0  . Biotin 5000 MCG CAPS Take 1 capsule by mouth daily.      . budesonide-formoterol (SYMBICORT) 80-4.5 MCG/ACT inhaler Take 2 puffs first thing in am and then another 2 puffs about 12 hours later.  1 Inhaler  12  . cetirizine (ZYRTEC) 10 MG tablet Take 10 mg by mouth at bedtime.       . cyclobenzaprine (FLEXERIL) 5 MG tablet Take 1 tablet (5 mg total) by mouth 3 (three) times daily as needed for muscle spasms.  60 tablet  1  . fluticasone (FLONASE) 50 MCG/ACT nasal spray Place 2 sprays into both nostrils 2 (two) times daily.  16 g  11  . guaiFENesin (MUCINEX) 600 MG 12 hr tablet   1-2 twice daily as needed      . montelukast (SINGULAIR) 10 MG tablet Take 1 tablet (10 mg total) by mouth at bedtime.  30 tablet  11  . omeprazole (PRILOSEC) 20 MG capsule Take 1 capsule (20 mg total) by mouth every morning.  30 capsule  11  . oxymetazoline (AFRIN) 0.05 % nasal spray Place 2 sprays into the nose 2 (two) times daily as needed. For 5 days      . ranitidine (ZANTAC) 150 MG tablet Take 150 mg by mouth 2 (two) times daily.      . sodium chloride  (OCEAN) 0.65 % nasal spray Place 2 sprays into the nose as needed for congestion.      . spironolactone (ALDACTONE) 25 MG tablet Take 25 mg by mouth daily.      . traMADol (ULTRAM) 50 MG tablet 1-2 every 4 hours as needed for cough or pain  40 tablet  0  . zinc gluconate 50 MG tablet Take 50 mg by mouth daily.      . HYDROcodone-homatropine (HYCODAN) 5-1.5 MG/5ML syrup Take 5 mLs by mouth once.        Home: Home Living Family/patient expects to be discharged to:: Private residence Living Arrangements: Other relatives Available Help at Discharge: Family;Available PRN/intermittently Type of Home: House Home Access: Level entry Home Layout: One level Home Equipment: None  Lives With: Alone   Functional History: Prior Function Vocation: Part time employment  Functional Status:  Mobility: Bed Mobility Bed Mobility: Supine to Sit Rolling Right: 4: Min assist;With rail Rolling Left: 4: Min assist;With rail Right Sidelying to Sit: 1: +2 Total assist Right Sidelying to Sit: Patient Percentage: 50% Supine to Sit: 3: Mod assist;HOB elevated Supine to Sit: Patient Percentage: 40% Sit to Supine: 3: Mod assist Sit to Supine: Patient Percentage: 30% Transfers Transfers: Sit to Stand;Stand to Sit Sit to Stand: 1: +2 Total assist;From bed;From chair/3-in-1 Sit to Stand: Patient Percentage: 60% Stand to Sit: 1: +2 Total assist;To bed;To chair/3-in-1 Stand to Sit: Patient Percentage: 60% Ambulation/Gait Ambulation/Gait Assistance: 1: +2 Total assist Ambulation/Gait: Patient Percentage: 70% Ambulation Distance (Feet): 40 Feet Assistive device: Rolling walker Ambulation/Gait Assistance Details: Assist to stabilize/support pt and maintain R hand grip on walker. Increased assitance with turns, espcially to R side due to neglect. R LE externally rotated and decreased DF noted as well. slow gait speed Gait Pattern: Decreased dorsiflexion - right;Decreased weight shift to right     ADL: ADL Eating/Feeding: NPO Grooming: Brushing hair;Wash/dry face;Teeth care;Moderate assistance;Other (comment) (MAX cues to incorporate RUE and pt is R handed) Where Assessed - Grooming: Supine, head of bed up Upper Body Bathing: Maximal assistance Where Assessed - Upper Body Bathing: Supine, head of bed up Lower Body Bathing: +1 Total assistance Where Assessed - Lower Body Bathing: Supine, head of bed up;Rolling right and/or left Upper Body Dressing: Maximal assistance Where Assessed - Upper Body Dressing: Supported sitting Lower Body Dressing: +2 Total assistance Where Assessed - Lower Body Dressing: Unsupported sit to stand Toilet Transfer: +2 Total assistance Toilet Transfer Method: Sit to stand;Stand pivot Toilet Transfer Equipment: Bedside commode Transfers/Ambulation Related to ADLs: when pt first sat up, expression funny. Unable to verbalize if dizzy.  BP 139/96.  Stood briefly with A x 2.  Unable to advance RLE, legs supported by bed ADL Comments: pt has decreased attention:  cued to continue to wash face.  Overattends to L side; when LUE engaged, can have her use R. Pt needed MAX cues    Cognition: Cognition Overall Cognitive Status: Impaired/Different from baseline Arousal/Alertness: Awake/alert Orientation Level: Disoriented to place;Oriented to situation Attention: Focused Focused Attention: Impaired Focused Attention Impairment: Verbal basic;Functional basic Memory: Impaired Memory Impairment: Storage deficit;Retrieval deficit;Decreased recall of new information Awareness: Impaired Awareness Impairment: Intellectual impairment;Emergent impairment;Anticipatory impairment Problem Solving: Impaired Problem Solving Impairment: Verbal basic;Functional basic Executive Function: Reasoning;Sequencing;Organizing;Initiating;Self Monitoring Reasoning: Impaired Reasoning Impairment: Verbal basic;Functional basic Sequencing: Impaired Sequencing Impairment: Verbal  basic;Functional basic Organizing: Impaired Organizing Impairment: Verbal basic;Functional basic Initiating: Impaired Initiating Impairment: Verbal basic;Functional basic Self Monitoring: Impaired Self Monitoring Impairment: Verbal basic;Functional basic Behaviors: Impulsive;Perseveration Safety/Judgment: Impaired Cognition Arousal/Alertness: Awake/alert Behavior During Therapy: Impulsive Overall Cognitive Status: Impaired/Different from baseline Area of Impairment: Awareness;Problem solving;Safety/judgement;Following commands;Memory Orientation Level: Place;Time;Situation Memory: Decreased short-term memory Following Commands: Follows one step commands with increased time Safety/Judgement: Decreased awareness of safety;Decreased awareness of deficits Problem Solving: Requires tactile cues;Requires verbal cues;Difficulty sequencing General Comments: pt has word finding difficulty.  says " that arm doesnt work- can I get a new one? "     Blood pressure 159/96, pulse 102, temperature 97.7 F (36.5 C), temperature source Oral, resp. rate 20, height 5' 3" (1.6 m), weight 96.6 kg (212 lb 15.4 oz), SpO2 98.00%. Physical Exam Vitals reviewed.  General: pleasant and NAD. A little fatigued appearing HENT: oral mucosa pink and moist. Dentition is fair Head: Normocephalic.  Eyes: EOM are normal.  Neck: Normal range of motion. Neck supple. No thyromegaly present.  Cardiovascular: Normal rate and regular rhythm. No murmurs, rubs, gallops Respiratory: Effort normal and breath sounds normal. No respiratory distress. No wheezes GI: Soft. Bowel sounds are normal. She exhibits no distension. Non-distended Neurological: She is alert.  Patient with limited awareness of her deficits. She was able to state her name but was easily distracted.   RUE grossly 4/5. RLE also 4/5 but inconsistent. Has 1/2 sensation on the right. Has right inattention. Displays automatic movement of the right side but has  difficulty with planned movement. Sometimes perseverates on a given task or movement. Has difficulties with expressive language, appears apraxic, uses paraphrases at times. No gross cranial nerve deficits.  Skin: Skin is warm and dry.    Psychiatric:   Anxious but generally pleasant. A little less distracted than yesterday.  Results for orders placed during the hospital encounter of 11/20/13 (from the past 48 hour(s))  BASIC METABOLIC PANEL     Status: Abnormal   Collection Time    11/24/13  5:08 AM      Result Value Range   Sodium 134 (*) 135 - 145 mEq/L   Potassium 4.1  3.5 - 5.1 mEq/L   Comment: NO VISIBLE HEMOLYSIS     DELTA CHECK NOTED   Chloride 101  96 - 112 mEq/L   CO2 20  19 - 32 mEq/L   Glucose, Bld 114 (*) 70 - 99 mg/dL   BUN 9  6 - 23 mg/dL   Creatinine, Ser 0.76  0.50 - 1.10 mg/dL   Calcium 9.1  8.4 - 10.5 mg/dL   GFR calc non Af Amer 90 (*) >90 mL/min   GFR calc Af Amer >90  >90 mL/min   Comment: (NOTE)     The eGFR has been calculated using the CKD EPI equation.     This calculation has not been validated in all clinical situations.     eGFR's persistently <90 mL/min signify possible Chronic Kidney     Disease.   No results found.  Post Admission Physician Evaluation: 1. Functional deficits   secondary  to embolic left MCA infarct. 2. Patient is admitted to receive collaborative, interdisciplinary care between the physiatrist, rehab nursing staff, and therapy team. 3. Patient's level of medical complexity and substantial therapy needs in context of that medical necessity cannot be provided at a lesser intensity of care such as a SNF. 4. Patient has experienced substantial functional loss from his/her baseline which was documented above under the "Functional History" and "Functional Status" headings.  Judging by the patient's diagnosis, physical exam, and functional history, the patient has potential for functional progress which will result in measurable gains while on  inpatient rehab.  These gains will be of substantial and practical use upon discharge  in facilitating mobility and self-care at the household level. 5. Physiatrist will provide 24 hour management of medical needs as well as oversight of the therapy plan/treatment and provide guidance as appropriate regarding the interaction of the two. 6. 24 hour rehab nursing will assist with bladder management, bowel management, safety, skin/wound care, disease management, medication administration, pain management and patient education  and help integrate therapy concepts, techniques,education, etc. 7. PT will assess and treat for/with: Lower extremity strength, range of motion, stamina, balance, functional mobility, safety, adaptive techniques and equipment, NMR, visual perceptual and cognitive perceptual rx.   Goals are: supervision. 8. OT will assess and treat for/with: ADL's, functional mobility, safety, upper extremity strength, adaptive techniques and equipment, NMR, cognitive perceptual and visual perceptual rx, education.   Goals are: supervision to minimal assist. 9. SLP will assess and treat for/with: cognition, communication.  Goals are: supervision to mod I. 10. Case Management and Social Worker will assess and treat for psychological issues and discharge planning. 11. Team conference will be held weekly to assess progress toward goals and to determine barriers to discharge. 12. Patient will receive at least 3 hours of therapy per day at least 5 days per week. 13. ELOS: 14-18 days       14. Prognosis:  excellent   Medical Problem List and Plan: 1. Embolic left MCA infarct 2. DVT Prophylaxis/Anticoagulation: Subcutaneous Lovenox. Monitor platelet counts and any signs of bleeding 3. Pain Management: Tylenol as needed. 4. Neuropsych: This patient is not capable of making decisions on her own behalf. 5. Dysphagia/aphasia. Followup speech therapy. Currently maintained on mechanical soft diet 6.  Hypertension. Aldactone 12.5 mg daily. Monitor with increased activity 7. Acute sinusitis. Augmentin has been completed 8. Hyperlipidemia. Lipitor 9. GERD. Protonix 10. Asthma. Continue inhalers. No complaints of shortness of breath. Anxiety could be a factor at times with breathing techniques and activity tolerance.  Zachary T. Swartz, MD, FAAPMR [facility]  11/25/2013 

## 2013-11-25 NOTE — H&P (View-Only) (Signed)
Physical Medicine and Rehabilitation Admission H&P    Chief Complaint  Patient presents with  . Altered Mental Status  :  Chief complaint: Right side weakness  HPI: Andrea Mason is a 62 y.o. right-handed female with history of hypertension and asthma. Admitted 11/20/2013 with altered mental status/questionable syncopal episode and right side weakness with aphasia. MRI of the brain showed acute infarct left MCA territory as well small area of acute infarct head of the caudate on the left. MRA of the head with high grade stenosis or likely occlusion of the inferior posterior left M2 branch corresponding to the area of acute infarction. Echocardiogram with ejection fraction of 55% normal systolic function. Carotid Dopplers negative for ICA stenosis. EEG with no seizure activity noted. Neurology services consulted. Patient did not receive TPA. Aspirin added for CVA prophylaxis. Subcutaneous Lovenox for DVT prophylaxis. Placed on Augmentin for questionable sinusitis and has since been completed. Physical and occupational therapy evaluations completed 11/22/2013 with recommendations of physical medicine rehabilitation consult to consider inpatient rehabilitation services. Patient was felt to be a good candidate for inpatient rehabilitation services was admitted for comprehensive rehabilitation program   ROS Review of Systems  Gastrointestinal:  GERD  Musculoskeletal: Positive for myalgias.  Psychiatric/Behavioral: Positive for depression.  All other systems reviewed and are negative  Past Medical History  Diagnosis Date  . Hypertension   . GERD (gastroesophageal reflux disease)   . Allergic rhinitis   . Asthma   . Obesity   . Depression   . History of colonic polyps   . DJD (degenerative joint disease), cervical   . 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 Nasal sinus surgery  07/2006    Dr. Annalee Genta  . Nasal polyp surgery  07/2006    x  2 with Dr. Annalee Genta  . Foot surgery  1998  . Tubal ligation     Family History  Problem Relation Age of Onset  . Colon cancer Brother    Social History:  reports that she has never smoked. She has never used smokeless tobacco. She reports that she does not drink alcohol or use illicit drugs. Allergies:  Allergies  Allergen Reactions  . Ivp Dye [Iodinated Diagnostic Agents] Nausea And Vomiting    Reaction: hot flashes  . Promethazine-Codeine Nausea And Vomiting   Medications Prior to Admission  Medication Sig Dispense Refill  . albuterol (PROAIR HFA) 108 (90 BASE) MCG/ACT inhaler Inhale 2 puffs into the lungs every 6 (six) hours as needed for wheezing. 2 puffs every 4 hours as needed only  if your can't catch your breath  1 Inhaler  2  . amoxicillin-clavulanate (AUGMENTIN) 875-125 MG per tablet Take 1 tablet by mouth 2 (two) times daily.  20 tablet  0  . Biotin 5000 MCG CAPS Take 1 capsule by mouth daily.      . budesonide-formoterol (SYMBICORT) 80-4.5 MCG/ACT inhaler Take 2 puffs first thing in am and then another 2 puffs about 12 hours later.  1 Inhaler  12  . cetirizine (ZYRTEC) 10 MG tablet Take 10 mg by mouth at bedtime.       . cyclobenzaprine (FLEXERIL) 5 MG tablet Take 1 tablet (5 mg total) by mouth 3 (three) times daily as needed for muscle spasms.  60 tablet  1  . fluticasone (FLONASE) 50 MCG/ACT nasal spray Place 2 sprays into both nostrils 2 (two) times daily.  16 g  11  . guaiFENesin (MUCINEX) 600 MG 12 hr tablet  1-2 twice daily as needed      . montelukast (SINGULAIR) 10 MG tablet Take 1 tablet (10 mg total) by mouth at bedtime.  30 tablet  11  . omeprazole (PRILOSEC) 20 MG capsule Take 1 capsule (20 mg total) by mouth every morning.  30 capsule  11  . oxymetazoline (AFRIN) 0.05 % nasal spray Place 2 sprays into the nose 2 (two) times daily as needed. For 5 days      . ranitidine (ZANTAC) 150 MG tablet Take 150 mg by mouth 2 (two) times daily.      . sodium chloride  (OCEAN) 0.65 % nasal spray Place 2 sprays into the nose as needed for congestion.      Marland Kitchen spironolactone (ALDACTONE) 25 MG tablet Take 25 mg by mouth daily.      . traMADol (ULTRAM) 50 MG tablet 1-2 every 4 hours as needed for cough or pain  40 tablet  0  . zinc gluconate 50 MG tablet Take 50 mg by mouth daily.      Marland Kitchen HYDROcodone-homatropine (HYCODAN) 5-1.5 MG/5ML syrup Take 5 mLs by mouth once.        Home: Home Living Family/patient expects to be discharged to:: Private residence Living Arrangements: Other relatives Available Help at Discharge: Family;Available PRN/intermittently Type of Home: House Home Access: Level entry Home Layout: One level Home Equipment: None  Lives With: Alone   Functional History: Prior Function Vocation: Part time employment  Functional Status:  Mobility: Bed Mobility Bed Mobility: Supine to Sit Rolling Right: 4: Min assist;With rail Rolling Left: 4: Min assist;With rail Right Sidelying to Sit: 1: +2 Total assist Right Sidelying to Sit: Patient Percentage: 50% Supine to Sit: 3: Mod assist;HOB elevated Supine to Sit: Patient Percentage: 40% Sit to Supine: 3: Mod assist Sit to Supine: Patient Percentage: 30% Transfers Transfers: Sit to Stand;Stand to Sit Sit to Stand: 1: +2 Total assist;From bed;From chair/3-in-1 Sit to Stand: Patient Percentage: 60% Stand to Sit: 1: +2 Total assist;To bed;To chair/3-in-1 Stand to Sit: Patient Percentage: 60% Ambulation/Gait Ambulation/Gait Assistance: 1: +2 Total assist Ambulation/Gait: Patient Percentage: 70% Ambulation Distance (Feet): 40 Feet Assistive device: Rolling walker Ambulation/Gait Assistance Details: Assist to stabilize/support pt and maintain R hand grip on walker. Increased assitance with turns, espcially to R side due to neglect. R LE externally rotated and decreased DF noted as well. slow gait speed Gait Pattern: Decreased dorsiflexion - right;Decreased weight shift to right     ADL: ADL Eating/Feeding: NPO Grooming: Brushing hair;Wash/dry face;Teeth care;Moderate assistance;Other (comment) (MAX cues to incorporate RUE and pt is R handed) Where Assessed - Grooming: Supine, head of bed up Upper Body Bathing: Maximal assistance Where Assessed - Upper Body Bathing: Supine, head of bed up Lower Body Bathing: +1 Total assistance Where Assessed - Lower Body Bathing: Supine, head of bed up;Rolling right and/or left Upper Body Dressing: Maximal assistance Where Assessed - Upper Body Dressing: Supported sitting Lower Body Dressing: +2 Total assistance Where Assessed - Lower Body Dressing: Unsupported sit to stand Toilet Transfer: +2 Total assistance Toilet Transfer Method: Sit to stand;Stand pivot Toilet Transfer Equipment: Bedside commode Transfers/Ambulation Related to ADLs: when pt first sat up, expression funny. Unable to verbalize if dizzy.  BP 139/96.  Stood briefly with A x 2.  Unable to advance RLE, legs supported by bed ADL Comments: pt has decreased attention:  cued to continue to wash face.  Overattends to L side; when LUE engaged, can have her use R. Pt needed MAX cues  Cognition: Cognition Overall Cognitive Status: Impaired/Different from baseline Arousal/Alertness: Awake/alert Orientation Level: Disoriented to place;Oriented to situation Attention: Focused Focused Attention: Impaired Focused Attention Impairment: Verbal basic;Functional basic Memory: Impaired Memory Impairment: Storage deficit;Retrieval deficit;Decreased recall of new information Awareness: Impaired Awareness Impairment: Intellectual impairment;Emergent impairment;Anticipatory impairment Problem Solving: Impaired Problem Solving Impairment: Verbal basic;Functional basic Executive Function: Reasoning;Sequencing;Organizing;Initiating;Self Monitoring Reasoning: Impaired Reasoning Impairment: Verbal basic;Functional basic Sequencing: Impaired Sequencing Impairment: Verbal  basic;Functional basic Organizing: Impaired Organizing Impairment: Verbal basic;Functional basic Initiating: Impaired Initiating Impairment: Verbal basic;Functional basic Self Monitoring: Impaired Self Monitoring Impairment: Verbal basic;Functional basic Behaviors: Impulsive;Perseveration Safety/Judgment: Impaired Cognition Arousal/Alertness: Awake/alert Behavior During Therapy: Impulsive Overall Cognitive Status: Impaired/Different from baseline Area of Impairment: Awareness;Problem solving;Safety/judgement;Following commands;Memory Orientation Level: Place;Time;Situation Memory: Decreased short-term memory Following Commands: Follows one step commands with increased time Safety/Judgement: Decreased awareness of safety;Decreased awareness of deficits Problem Solving: Requires tactile cues;Requires verbal cues;Difficulty sequencing General Comments: pt has word finding difficulty.  says " that arm doesnt work- can I get a new one? "     Blood pressure 159/96, pulse 102, temperature 97.7 F (36.5 C), temperature source Oral, resp. rate 20, height 5\' 3"  (1.6 m), weight 96.6 kg (212 lb 15.4 oz), SpO2 98.00%. Physical Exam Vitals reviewed.  General: pleasant and NAD. A little fatigued appearing HENT: oral mucosa pink and moist. Dentition is fair Head: Normocephalic.  Eyes: EOM are normal.  Neck: Normal range of motion. Neck supple. No thyromegaly present.  Cardiovascular: Normal rate and regular rhythm. No murmurs, rubs, gallops Respiratory: Effort normal and breath sounds normal. No respiratory distress. No wheezes GI: Soft. Bowel sounds are normal. She exhibits no distension. Non-distended Neurological: She is alert.  Patient with limited awareness of her deficits. She was able to state her name but was easily distracted.   RUE grossly 4/5. RLE also 4/5 but inconsistent. Has 1/2 sensation on the right. Has right inattention. Displays automatic movement of the right side but has  difficulty with planned movement. Sometimes perseverates on a given task or movement. Has difficulties with expressive language, appears apraxic, uses paraphrases at times. No gross cranial nerve deficits.  Skin: Skin is warm and dry.    Psychiatric:   Anxious but generally pleasant. A little less distracted than yesterday.  Results for orders placed during the hospital encounter of 11/20/13 (from the past 48 hour(s))  BASIC METABOLIC PANEL     Status: Abnormal   Collection Time    11/24/13  5:08 AM      Result Value Range   Sodium 134 (*) 135 - 145 mEq/L   Potassium 4.1  3.5 - 5.1 mEq/L   Comment: NO VISIBLE HEMOLYSIS     DELTA CHECK NOTED   Chloride 101  96 - 112 mEq/L   CO2 20  19 - 32 mEq/L   Glucose, Bld 114 (*) 70 - 99 mg/dL   BUN 9  6 - 23 mg/dL   Creatinine, Ser 3.08  0.50 - 1.10 mg/dL   Calcium 9.1  8.4 - 65.7 mg/dL   GFR calc non Af Amer 90 (*) >90 mL/min   GFR calc Af Amer >90  >90 mL/min   Comment: (NOTE)     The eGFR has been calculated using the CKD EPI equation.     This calculation has not been validated in all clinical situations.     eGFR's persistently <90 mL/min signify possible Chronic Kidney     Disease.   No results found.  Post Admission Physician Evaluation: 1. Functional deficits  secondary  to embolic left MCA infarct. 2. Patient is admitted to receive collaborative, interdisciplinary care between the physiatrist, rehab nursing staff, and therapy team. 3. Patient's level of medical complexity and substantial therapy needs in context of that medical necessity cannot be provided at a lesser intensity of care such as a SNF. 4. Patient has experienced substantial functional loss from his/her baseline which was documented above under the "Functional History" and "Functional Status" headings.  Judging by the patient's diagnosis, physical exam, and functional history, the patient has potential for functional progress which will result in measurable gains while on  inpatient rehab.  These gains will be of substantial and practical use upon discharge  in facilitating mobility and self-care at the household level. 5. Physiatrist will provide 24 hour management of medical needs as well as oversight of the therapy plan/treatment and provide guidance as appropriate regarding the interaction of the two. 6. 24 hour rehab nursing will assist with bladder management, bowel management, safety, skin/wound care, disease management, medication administration, pain management and patient education  and help integrate therapy concepts, techniques,education, etc. 7. PT will assess and treat for/with: Lower extremity strength, range of motion, stamina, balance, functional mobility, safety, adaptive techniques and equipment, NMR, visual perceptual and cognitive perceptual rx.   Goals are: supervision. 8. OT will assess and treat for/with: ADL's, functional mobility, safety, upper extremity strength, adaptive techniques and equipment, NMR, cognitive perceptual and visual perceptual rx, education.   Goals are: supervision to minimal assist. 9. SLP will assess and treat for/with: cognition, communication.  Goals are: supervision to mod I. 10. Case Management and Social Worker will assess and treat for psychological issues and discharge planning. 11. Team conference will be held weekly to assess progress toward goals and to determine barriers to discharge. 12. Patient will receive at least 3 hours of therapy per day at least 5 days per week. 13. ELOS: 14-18 days       14. Prognosis:  excellent   Medical Problem List and Plan: 1. Embolic left MCA infarct 2. DVT Prophylaxis/Anticoagulation: Subcutaneous Lovenox. Monitor platelet counts and any signs of bleeding 3. Pain Management: Tylenol as needed. 4. Neuropsych: This patient is not capable of making decisions on her own behalf. 5. Dysphagia/aphasia. Followup speech therapy. Currently maintained on mechanical soft diet 6.  Hypertension. Aldactone 12.5 mg daily. Monitor with increased activity 7. Acute sinusitis. Augmentin has been completed 8. Hyperlipidemia. Lipitor 9. GERD. Protonix 10. Asthma. Continue inhalers. No complaints of shortness of breath. Anxiety could be a factor at times with breathing techniques and activity tolerance.  Ranelle Oyster, MD, [facility] Physical Medicine & Rehabilitation  11/25/2013

## 2013-11-25 NOTE — Progress Notes (Signed)
Pt left at this time via [facility] headed to [facility]. Family aware of transfer. Pt alert and without c/o.

## 2013-11-25 NOTE — PMR Pre-admission (Signed)
PMR Admission Coordinator Pre-Admission Assessment  Patient: Andrea Mason is an 62 y.o., female MRN: [id number] DOB: Aug 30, 1951 Height: 5\' 3"  (160 cm) Weight: 96.6 kg (212 lb 15.4 oz)              Insurance Information HMO:     PPO: yes     PCP:      IPA:      80/20:      OTHER:  PRIMARY: [employer] of [place]      Policy#: [id number]      Subscriber: pt/Obama care CM Name: karen      Phone#: [phone]     Fax#: [phone] Pre-Cert#: [id number]      Employer: none update due 12/08/12 Benefits:  Phone #: [phone]     Name: 12/23 Eff. Date: 12/04/12     Deduct: $500      Out of Pocket Max: $700      Life Max: unlimited CIR: 70%      SNF: 70% 60 days per benefit Outpatient: 70%     Co-Pay: 30% 30 visits Home Health: 70%      Co-Pay: 30% DME: 70%     Co-Pay: 30% Providers: in network  SECONDARY: none        Medicaid Application Date: pt requesting disability and medicaid applaications      Case Manager:  Disability Application Date:       Case Worker:   Emergency Contact Information   Pt requesting 12/23 for Andrea Mason to be primary contact. Her deceased son's wife.   Contact Information   Name Relation Home Work Mobile   Andrea Mason Sister [phone]     Andrea Mason Mother [phone]     [facility] Sister [phone]     Andrea Mason [phone]       Current Medical History  Patient Admitting Diagnosis: Left MCA infarct  History of Present Illness: Andrea Mason is a 62 y.o. right-handed female with history of hypertension. Admitted 11/20/2013 with altered mental status/questionable syncopal episode and right side weakness with aphasia. MRI of the brain showed acute infarct left MCA territory as well small area of acute infarct head of the caudate on the left. Echocardiogram with ejection fraction of 55% normal systolic function. Carotid Dopplers negative for ICA stenosis. EEG with no seizure activity noted. Neurology services consulted. Patient did not  receive TPA. Aspirin added for CVA prophylaxis. Subcutaneous Lovenox for DVT prophylaxis. Placed on Augmentin for questionable sinusitis.   Past Medical History  Past Medical History  Diagnosis Date  . Hypertension   . GERD (gastroesophageal reflux disease)   . Allergic rhinitis   . Asthma   . Obesity   . Depression   . History of colonic polyps   . DJD (degenerative joint disease), cervical   . 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  Prior Rehab/Hospitalizations: none   Current Medications  Current facility-administered medications:acetaminophen (TYLENOL) suppository 650 mg, 650 mg, Rectal, Q6H PRN, Eduard Clos, MD;  acetaminophen (TYLENOL) tablet 650 mg, 650 mg, Oral, Q6H PRN, Eduard Clos, MD, 650 mg at 11/24/13 2245;  albuterol (PROVENTIL HFA;VENTOLIN HFA) 108 (90 BASE) MCG/ACT inhaler 2 puff, 2 puff, Inhalation, Q6H PRN, Eduard Clos, MD amoxicillin-clavulanate (AUGMENTIN) 875-125 MG per tablet 1 tablet, 1 tablet, Oral, BID, Renae Fickle, MD, 1 tablet at 11/25/13 1055;  aspirin EC tablet 325 mg, 325 mg, Oral, Daily, Renae Fickle, MD, 325 mg at 11/25/13 1055;  atorvastatin (LIPITOR)  tablet 20 mg, 20 mg, Oral, q1800, Renae Fickle, MD, 20 mg at 11/24/13 1640 budesonide-formoterol (SYMBICORT) 80-4.5 MCG/ACT inhaler 2 puff, 2 puff, Inhalation, BID, Eduard Clos, MD, 2 puff at 11/25/13 1131;  dextrose 5 % and 0.45 % NaCl with KCl 40 mEq/L infusion, , Intravenous, Continuous, Renae Fickle, MD, Last Rate: 20 mL/hr at 11/24/13 1116;  enoxaparin (LOVENOX) injection 40 mg, 40 mg, Subcutaneous, Q24H, Eduard Clos, MD, 40 mg at 11/24/13 2245 fluticasone (FLONASE) 50 MCG/ACT nasal spray 2 spray, 2 spray, Each Nare, BID, Eduard Clos, MD, 2 spray at 11/25/13 1055;  hydrALAZINE (APRESOLINE) injection 10 mg, 10 mg, Intravenous, Q4H PRN, Eduard Clos, MD;  LORazepam (ATIVAN)  injection 0.5 mg, 0.5 mg, Intravenous, Once, Leanne Chang, NP;  ondansetron (ZOFRAN) injection 4 mg, 4 mg, Intravenous, Q6H PRN, Eduard Clos, MD ondansetron [facility]) tablet 4 mg, 4 mg, Oral, Q6H PRN, Eduard Clos, MD;  pantoprazole (PROTONIX) injection 40 mg, 40 mg, Intravenous, Q24H, Renae Fickle, MD, 40 mg at 11/24/13 1515;  sodium chloride 0.9 % injection 3 mL, 3 mL, Intravenous, Q12H, Eduard Clos, MD, 3 mL at 11/25/13 1055;  spironolactone (ALDACTONE) tablet 12.5 mg, 12.5 mg, Oral, Daily, Renae Fickle, MD, 12.5 mg at 11/25/13 1055  Patients Current Diet: Dysphagia 3 diet with thin liquids  Precautions / Restrictions Precautions Precautions: Fall Restrictions Weight Bearing Restrictions: No   Prior Activity Level Household: active, driving and independent pta  Journalist, newspaper / Equipment Home Assistive Devices/Equipment: None Home Equipment: None  Prior Functional Level Prior Function Level of Independence: Independent  Current Functional Level Cognition  Arousal/Alertness: Awake/alert Overall Cognitive Status: Impaired/Different from baseline Orientation Level: Disoriented to place;Oriented to situation Following Commands: Follows one step commands with increased time Safety/Judgement: Decreased awareness of safety;Decreased awareness of deficits General Comments: pt has word finding difficulty.   Attention: Focused Focused Attention: Impaired Focused Attention Impairment: Verbal basic;Functional basic Memory: Impaired Memory Impairment: Storage deficit;Retrieval deficit;Decreased recall of new information Awareness: Impaired Awareness Impairment: Intellectual impairment;Emergent impairment;Anticipatory impairment Problem Solving: Impaired Problem Solving Impairment: Verbal basic;Functional basic Executive Function: Reasoning;Sequencing;Organizing;Initiating;Self Monitoring Reasoning: Impaired Reasoning Impairment: Verbal  basic;Functional basic Sequencing: Impaired Sequencing Impairment: Verbal basic;Functional basic Organizing: Impaired Organizing Impairment: Verbal basic;Functional basic Initiating: Impaired Initiating Impairment: Verbal basic;Functional basic Self Monitoring: Impaired Self Monitoring Impairment: Verbal basic;Functional basic Behaviors: Impulsive;Perseveration Safety/Judgment: Impaired    Extremity Assessment (includes Sensation/Coordination)          ADLs  Eating/Feeding: NPO Grooming: Brushing hair;Wash/dry face;Teeth care;Moderate assistance;Other (comment) (MAX cues to incorporate RUE and pt is R handed) Where Assessed - Grooming: Supine, head of bed up Upper Body Bathing: Maximal assistance Where Assessed - Upper Body Bathing: Supine, head of bed up Lower Body Bathing: +1 Total assistance Where Assessed - Lower Body Bathing: Supine, head of bed up;Rolling right and/or left Upper Body Dressing: Maximal assistance Where Assessed - Upper Body Dressing: Supported sitting Lower Body Dressing: +2 Total assistance Lower Body Dressing: Patient Percentage: 0% Where Assessed - Lower Body Dressing: Unsupported sit to stand Toilet Transfer: +2 Total assistance Toilet Transfer: Patient Percentage: 60% Toilet Transfer Method: Sit to stand;Stand pivot Acupuncturist: Bedside commode Toileting - Clothing Manipulation and Hygiene: +2 Total assistance Toileting - Clothing Manipulation and Hygiene: Patient Percentage: 60% Where Assessed - Toileting Clothing Manipulation and Hygiene: Standing Transfers/Ambulation Related to ADLs: when pt first sat up, expression funny. Unable to verbalize if dizzy.  BP 139/96.  Stood briefly with A x 2.  Unable  to advance RLE, legs supported by bed ADL Comments: pt has decreased attention:  cued to continue to wash face.  Overattends to L side; when LUE engaged, can have her use R. Pt needed MAX cues    Mobility  Bed Mobility: Supine to Sit;Sit  to Supine Rolling Right: 4: Min assist;With rail Rolling Left: 4: Min assist;With rail Right Sidelying to Sit: 1: +2 Total assist Right Sidelying to Sit: Patient Percentage: 50% Supine to Sit: 3: Mod assist Supine to Sit: Patient Percentage: 40% Sit to Supine: 3: Mod assist Sit to Supine: Patient Percentage: 30%    Transfers  Transfers: Sit to Stand;Stand to Sit Sit to Stand: 3: Mod assist;From bed Sit to Stand: Patient Percentage: 60% Stand to Sit: 3: Mod assist;To bed Stand to Sit: Patient Percentage: 60%    Ambulation / Gait / Stairs / Psychologist, prison and probation services  Ambulation/Gait Ambulation/Gait Assistance: 3: Mod assist Ambulation/Gait: Patient Percentage: 70% Ambulation Distance (Feet): 75 Feet Assistive device: Rolling walker Ambulation/Gait Assistance Details: Assist to stabilize/support pt and maintain R hand grip on walker. Increased assistance with turns. LOB x 2 posteriorly today, especially when pt released grip on walker. Fatigues fairly easily.  Gait Pattern: Decreased dorsiflexion - right;Decreased weight shift to right    Posture / Balance Static Sitting Balance Static Sitting - Balance Support: Bilateral upper extremity supported;Feet supported Static Sitting - Level of Assistance: 5: Stand by assistance Static Sitting - Comment/# of Minutes: close guard for static stitting.  Dynamic Sitting Balance Dynamic Sitting - Balance Support: Bilateral upper extremity supported;Feet supported;Feet unsupported Dynamic Sitting - Level of Assistance: 4: Min assist Dynamic Sitting Balance - Compensations: Increased posterior sway noted with movement of LEs Dynamic Sitting - Comments: minimal forward reach with L in all directions.  Also held both hands and slightly shifted trunk with push/pull alternative action    Special needs/care consideration Bowel mgmt: continent Bladder mgmt:continent   Previous Home Environment Living Arrangements: Alone  Lives With: Alone Available Help  at Discharge: Family;Available PRN/intermittently Type of Home: Apartment Home Layout: One level Home Access: Level entry Bathroom Shower/Tub: Engineer, manufacturing systems: Standard Bathroom Accessibility: Yes How Accessible: Accessible via walker Home Care Services: No Additional Comments: senior living apartments with emergency call system  Discharge Living Setting Plans for Discharge Living Setting: Other (Comment) (to move to her mother's home) Type of Home at Discharge: House Discharge Home Layout: One level Discharge Home Access: Stairs to enter Entrance Stairs-Rails: None Entrance Stairs-Number of Steps: 1 or 2 Discharge Bathroom Shower/Tub: Tub/shower unit Discharge Bathroom Toilet: Standard Discharge Bathroom Accessibility: Yes How Accessible: Accessible via walker Does the patient have any problems obtaining your medications?: No  Social/Family/Support Systems Contact Information: Pt request Andrea Mason to be main contact; sister, Andrea Mason wants to be. I have told all three they need to discuss and clarify main contact Anticipated Caregiver: family to arrange 24/7 care at pt's Mom's home Anticipated Caregiver's Contact Information: Andrea Mason first and then [place] Ability/Limitations of Caregiver: family all work but taking shifts Caregiver Availability: 24/7 Discharge Plan Discussed with Primary Caregiver: Yes Is Caregiver In Agreement with Plan?: Yes Does Caregiver/Family have Issues with Lodging/Transportation while Pt is in Rehab?: No  Goals/Additional Needs Patient/Family Goal for Rehab: supervision PT, supervision to min assist OT and SLP Expected length of stay: ELOS 2 weeks Special Service Needs: Andrea Mason is requesting disabilty and medicaid applications as well as clarification of primary contact Pt/Family Agrees to Admission and willing to participate: Yes Program Orientation Provided & Reviewed with  Pt/Caregiver Including Roles  & Responsibilities: Yes Additional  Information Needs: Andrea Mason, cousin, may be a caregiver during the day   Decrease burden of Care through IP rehab admission: n/a  Possible need for SNF placement upon discharge:not anticipated. I discussed with pt , Andrea Mason, and Andrea Mason that BCBS will not pay for inpt rehab and SNF.   Patient Condition: This patient's condition remains as documented in the consult dated 11/24/13, in which the Rehabilitation Physician determined and documented that the patient's condition is appropriate for intensive rehabilitative care in an inpatient rehabilitation facility. Will admit to inpatient rehab today.  Preadmission Screen Completed By:  Clois Dupes, 11/25/2013 1:10 PM ______________________________________________________________________   Discussed status with Dr. Riley Kill   On 11/25/13 at  1310 and received telephone approval for admission today.  Admission Coordinator:  Clois Dupes, time 1610 Date 11/25/13.

## 2013-11-25 NOTE — Progress Notes (Signed)
Occupational Therapy Treatment Patient Details Name: Andrea Mason MRN: [id number] DOB: 1951/07/27 Today's Date: 11/25/2013 Time: 1191-4782 OT Time Calculation (min): 24 min  OT Assessment / Plan / Recommendation  History of present illness Andrea Mason is an 62 y.o. female with a past medical history significant for HTN, obesity, depression, DJD, colonic polyps, primary hyperaldosteronism, admitted to [facility] due to confusion and syncope.  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 This involves the left posterior insula and parietal lobe. Small area of acute infarct in the head of the caudate on the left.    OT comments  Pt making progress with functional goals, continues to demo poor safety/impulsivity. Pt requires multimodal cues for initiating and completing tasks with increased time. Pt with R neglect and difficulty attending to tasks on ride side  Follow Up Recommendations  CIR    Barriers to Discharge   none    Equipment Recommendations  Other (comment) (TBD at next venue of care)    Recommendations for Other Services    Frequency Min 3X/week   Progress towards OT Goals Progress towards OT goals: Progressing toward goals  Plan Discharge plan remains appropriate    Precautions / Restrictions Precautions Precautions: Fall Restrictions Weight Bearing Restrictions: No   Pertinent Vitals/Pain No c/o pain    ADL  Grooming: Wash/dry face;Moderate assistance;Wash/dry hands;Minimal assistance Where Assessed - Grooming: Supported standing Upper Body Bathing: Moderate assistance;Maximal assistance Where Assessed - Upper Body Bathing: Unsupported sitting Upper Body Dressing: Performed;Moderate assistance;Maximal assistance Where Assessed - Upper Body Dressing: Unsupported sitting Toilet Transfer: Performed;Minimal assistance;Moderate assistance Toilet Transfer Method: Sit to stand Toilet Transfer Equipment: Regular height toilet;Grab  bars Toileting - Clothing Manipulation and Hygiene: Performed;Maximal assistance Where Assessed - Glass blower/designer Manipulation and Hygiene: Standing Tub/Shower Transfer Method: Not assessed Transfers/Ambulation Related to ADLs: Assist to rise, stabilize, control descent. Multimodal cues for safety, technique, hand placement on walker.  ADL Comments: multimodal cues for completion, directions. Pt unaware that she dropped toilet paper on floor when performing hygiene. Increased time to complete tasks, R neglect    OT Diagnosis:    OT Problem List:   OT Treatment Interventions:     OT Goals(current goals can now be found in the care plan section)    Visit Information  Last OT Received On: 11/25/13 Assistance Needed: +1 History of Present Illness: Andrea Mason is an 62 y.o. female with a past medical history significant for HTN, obesity, depression, DJD, colonic polyps, primary hyperaldosteronism, admitted to [facility] due to confusion and syncope.  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 This involves the left posterior insula and parietal lobe. Small area of acute infarct in the head of the caudate on the left.     Subjective Data      Prior Functioning       Cognition  Cognition Arousal/Alertness: Awake/alert Behavior During Therapy: Impulsive Overall Cognitive Status: Impaired/Different from baseline Area of Impairment: Awareness;Problem solving;Safety/judgement;Following commands;Memory Orientation Level: Place;Time;Situation Memory: Decreased short-term memory Following Commands: Follows one step commands with increased time Safety/Judgement: Decreased awareness of safety;Decreased awareness of deficits General Comments: pt has word finding difficulty, R neglect    Mobility  Bed Mobility Bed Mobility: Supine to Sit;Sit to Supine Supine to Sit: 4: Min assist Sit to Supine: 3: Mod assist Details for Bed Mobility Assistance: Pt is  impulsive. Assist for trunk to upright and stabilization and LEs back onot bed and for  safety in general.  Pt struggles with controlling trunk and R LE.Cues for safety, technique however pt prefers to perform bed mobility her way.  Transfers Transfers: Sit to Stand;Stand to Sit Sit to Stand: 3: Mod assist;From bed;4: Min assist;From toilet Stand to Sit: 3: Mod assist;To bed;To toilet;4: Min assist Details for Transfer Assistance: Assist to rise, stabilize, control descent. Multimodal cues for safety, technique, hand placement on walker.     Exercises      Balance Balance Balance Assessed: Yes Static Sitting Balance Static Sitting - Balance Support: Bilateral upper extremity supported;Feet supported Static Sitting - Level of Assistance: 5: Stand by assistance Dynamic Sitting Balance Dynamic Sitting - Balance Support: Bilateral upper extremity supported;Feet supported;Feet unsupported Dynamic Sitting - Level of Assistance: 4: Min assist Static Standing Balance Static Standing - Balance Support: Right upper extremity supported;Left upper extremity supported;During functional activity Static Standing - Level of Assistance: 4: Min assist Dynamic Standing Balance Dynamic Standing - Balance Support: Left upper extremity supported;Right upper extremity supported;During functional activity Dynamic Standing - Level of Assistance: 3: Mod assist   End of Session OT - End of Session Equipment Utilized During Treatment: Gait belt;Rolling walker;Other (comment) (3 in 1) Activity Tolerance: Patient tolerated treatment well Patient left: in bed;with call bell/phone within reach;with bed alarm set  GO     Galen Manila 11/25/2013, 2:56 PM

## 2013-11-25 NOTE — Progress Notes (Addendum)
I met with pt and her daughter in law, Andrea Mason, at bedside. Discussed inpt rehab venue as an option for her recovery. Pt lived alone and was independent pta. Family to discuss 24/7 caregiver support after any rehab. BCBS will not approve inpt rehab stay and then SNF after short rehab stay. Therefore I have explained that 24/7 care will need to be clarified before I can recommend inpt rehab admission vs SNF. I will follow up by tomorrow to assist in determining final dispo of inpt rehab vs SNF. Pt verbalized that she wishes for Andrea Mason to handle her affairs at this time. She also states that she has not discussed these wishes with her sister, Andrea Mason, yet. Andrea Mason to call and discuss with Andrea Mason today. [phone] I have also contacted Andrea Mason, pt's sister and discussed with her options for her rehab. Family is discussing options and will clal me back today. [phone]

## 2013-11-25 NOTE — Progress Notes (Signed)
Clinical Social Work Department BRIEF PSYCHOSOCIAL ASSESSMENT 11/25/2013  Patient:  Andrea Mason, [id number]     Account Number:  [id number]     Admit date:  11/20/2013  Clinical Social Worker:  Orpah Greek  Date/Time:  11/25/2013 11:40 AM  Referred by:  Physician  Date Referred:  11/25/2013 Referred for  SNF Placement   Other Referral:   Interview type:  Patient Other interview type:   and daughter-in-law, Tameka & Tameka's mother, Duaine Dredge at bedside    PSYCHOSOCIAL DATA Living Status:  ALONE Admitted from facility:   Level of care:   Primary support name:  Ossie Roselyn Bering (mother) ph#: [phone] Primary support relationship to patient:  PARENT Degree of support available:   good    CURRENT CONCERNS Current Concerns  Post-Acute Placement   Other Concerns:    SOCIAL WORK ASSESSMENT / PLAN CSW received call from Therapist, occupational that patient's family at bedside was requesting information on Healthcare POA.   Assessment/plan status:  Information/Referral to [facility] Other assessment/ plan:   Patient is being worked-up for [facility] Inpatient Rehab, SNF as a backup. Patient's daughter-in-law, Wandra Mannan is requesting [facility] if BCBS does not approve CIR.   Information/referral to community resources:   CSW provided family at bedside with Advance Directive packet but explained that due to patient's current cognitive status, she is unable to sign it at this time.    CSW completed FL2 and faxed information out to [facility] - will follow-up if BCBS does not approve for CIR.    PATIENT'S/FAMILY'S RESPONSE TO PLAN OF CARE: Patient & family are hoping for [facility] Inpatient Rehab given diagnosis. Family is trying to figure out arranging 24/7 care after Inpatient rehab stay.       Unice Bailey, LCSW [facility] Clinical Social Worker cell #: [phone]

## 2013-11-25 NOTE — Progress Notes (Signed)
Physical Therapy Treatment Patient Details Name: Natoshia Souter MRN: [id number] DOB: [birth year] Today's Date: 11/25/2013 Time: 7829-5621 PT Time Calculation (min): 23 min  PT Assessment / Plan / Recommendation  History of Present Illness Sherronda Sweigert is an 62 y.o. female with a past medical history significant for HTN, obesity, depression, DJD, colonic polyps, primary hyperaldosteronism, admitted to [facility] due to confusion and syncope.  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 This involves the left posterior insula and parietal lobe. Small area of acute infarct in the head of the caudate on the left.    PT Comments   Progressing with mobility. Pt is still impulsive. Continues to have difficulty staying on task, attending to R side, maintain R UE grip. Would benefit greatly from CIR stay.   Follow Up Recommendations  CIR     Does the patient have the potential to tolerate intense rehabilitation     Barriers to Discharge        Equipment Recommendations       Recommendations for Other Services    Frequency Min 4X/week   Progress towards PT Goals Progress towards PT goals: Progressing toward goals  Plan Current plan remains appropriate    Precautions / Restrictions Precautions Precautions: Fall Restrictions Weight Bearing Restrictions: No   Pertinent Vitals/Pain Pt denies pain    Mobility  Bed Mobility Bed Mobility: Supine to Sit;Sit to Supine Supine to Sit: 3: Mod assist Sit to Supine: 3: Mod assist Details for Bed Mobility Assistance: Pt is impulsive. Assist for trunk to upright and stabilization and for safety in general.  Pt struggles with controlling trunk and R LE.Cues for safety, technique however pt prefers to perform bed mobility her way.  Transfers Transfers: Sit to Stand;Stand to Sit Sit to Stand: 3: Mod assist;From bed Stand to Sit: 3: Mod assist;To bed Details for Transfer Assistance: Assist to rise, stabilize,  control descent. Multimodal cues for safety, technique, hand placement on walker.  Ambulation/Gait Ambulation/Gait Assistance: 3: Mod assist Ambulation Distance (Feet): 75 Feet Assistive device: Rolling walker Ambulation/Gait Assistance Details: Assist to stabilize/support pt and maintain R hand grip on walker. Increased assistance with turns. LOB x 2 posteriorly today, especially when pt released grip on walker. Fatigues fairly easily.  Gait Pattern: Decreased dorsiflexion - right;Decreased weight shift to right    Exercises General Exercises - Lower Extremity Ankle Circles/Pumps: AROM;AAROM;10 reps;Supine Heel Slides: AAROM;Right;10 reps;Supine Hip ABduction/ADduction: AAROM;Right;10 reps;Supine Straight Leg Raises: AAROM;Right;10 reps;Supine   PT Diagnosis:    PT Problem List:   PT Treatment Interventions:     PT Goals (current goals can now be found in the care plan section)    Visit Information  Last PT Received On: 11/25/13 Assistance Needed: +2 (safety) History of Present Illness: Terrel Manalo is an 62 y.o. female with a past medical history significant for HTN, obesity, depression, DJD, colonic polyps, primary hyperaldosteronism, admitted to [facility] due to confusion and syncope.  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 This involves the left posterior insula and parietal lobe. Small area of acute infarct in the head of the caudate on the left.     Subjective Data      Cognition  Cognition Arousal/Alertness: Awake/alert Behavior During Therapy: Impulsive Overall Cognitive Status: Impaired/Different from baseline Area of Impairment: Awareness;Problem solving;Safety/judgement;Following commands;Memory Orientation Level: Place;Time;Situation Memory: Decreased short-term memory Following Commands: Follows one step commands with increased time Safety/Judgement: Decreased awareness of safety;Decreased  awareness of deficits Problem  Solving: Requires tactile cues;Requires verbal cues;Difficulty sequencing General Comments: pt has word finding difficulty.      Balance  Balance Balance Assessed: Yes Static Sitting Balance Static Sitting - Balance Support: Bilateral upper extremity supported;Feet supported Static Sitting - Level of Assistance: 5: Stand by assistance Dynamic Sitting Balance Dynamic Sitting - Balance Support: Bilateral upper extremity supported;Feet supported;Feet unsupported Dynamic Sitting - Level of Assistance: 4: Min assist Dynamic Sitting Balance - Compensations: Increased posterior sway noted with movement of LEs  End of Session PT - End of Session Equipment Utilized During Treatment: Gait belt Activity Tolerance: Patient limited by fatigue;Patient tolerated treatment well Patient left: in bed;with call bell/phone within reach;with bed alarm set;with family/visitor present Nurse Communication: Mobility status   GP     Rebeca Alert, MPT Pager: [phone]

## 2013-11-25 NOTE — Progress Notes (Signed)
I have insurance approval and family all in agreement to admit to inpt rehab today. I have discussed with Dr. Malachi Bonds, [facility], and family. I will arrange admit. [phone]

## 2013-11-25 NOTE — Progress Notes (Signed)
Patient discharged to [facility] today. CSW signing off.   Clinical Social Work Department CLINICAL SOCIAL WORK PLACEMENT NOTE 11/25/2013  Patient:  Andrea Mason, Andrea Mason  Account Number:  [id number] Admit date:  11/20/2013  Clinical Social Worker:  Orpah Greek  Date/time:  11/25/2013 12:20 PM  Clinical Social Work is seeking post-discharge placement for this patient at the following level of care:   SKILLED NURSING   (*CSW will update this form in Epic as items are completed)   11/25/2013  Patient/family provided with [facility] Department of Clinical Social Work's list of facilities offering this level of care within the geographic area requested by the patient (or if unable, by the patient's family).  11/25/2013  Patient/family informed of their freedom to choose among providers that offer the needed level of care, that participate in Medicare, Medicaid or managed care program needed by the patient, have an available bed and are willing to accept the patient.  11/25/2013  Patient/family informed of [facility]' ownership interest in [facility], as well as of the fact that they are under no obligation to receive care at this facility.  PASARR submitted to EDS on  PASARR number received from EDS on   FL2 transmitted to all facilities in geographic area requested by pt/family on  11/25/2013 FL2 transmitted to all facilities within larger geographic area on   Patient informed that his/her managed care company has contracts with or will negotiate with  certain facilities, including the following:     Patient/family informed of bed offers received:   Patient chooses bed at  Physician recommends and patient chooses bed at    Patient to be transferred to [facility] (CIR) on  11/25/13 Patient to be transferred to facility by [facility]  The following physician request were entered in Epic:   Additional Comments:   Unice Bailey, LCSW [facility] Clinical Social Worker cell #: [phone]

## 2013-11-25 NOTE — Progress Notes (Signed)
Clinical Social Work Department CLINICAL SOCIAL WORK PLACEMENT NOTE 11/25/2013  Patient:  Andrea Mason, Andrea Mason  Account Number:  [id number] Admit date:  11/20/2013  Clinical Social Worker:  Orpah Greek  Date/time:  11/25/2013 12:20 PM  Clinical Social Work is seeking post-discharge placement for this patient at the following level of care:   SKILLED NURSING   (*CSW will update this form in Epic as items are completed)   11/25/2013  Patient/family provided with [facility] Department of Clinical Social Work's list of facilities offering this level of care within the geographic area requested by the patient (or if unable, by the patient's family).  11/25/2013  Patient/family informed of their freedom to choose among providers that offer the needed level of care, that participate in Medicare, Medicaid or managed care program needed by the patient, have an available bed and are willing to accept the patient.  11/25/2013  Patient/family informed of [facility]' ownership interest in [facility], as well as of the fact that they are under no obligation to receive care at this facility.  PASARR submitted to EDS on  PASARR number received from EDS on   FL2 transmitted to all facilities in geographic area requested by pt/family on  11/25/2013 FL2 transmitted to all facilities within larger geographic area on   Patient informed that his/her managed care company has contracts with or will negotiate with  certain facilities, including the following:     Patient/family informed of bed offers received:   Patient chooses bed at  Physician recommends and patient chooses bed at    Patient to be transferred to  on   Patient to be transferred to facility by   The following physician request were entered in Epic:   Additional Comments:   Unice Bailey, LCSW [facility] Clinical Social Worker cell #: [phone]

## 2013-11-25 NOTE — Interval H&P Note (Signed)
Andrea Mason was admitted today to Inpatient Rehabilitation with the diagnosis of embolic left MCA infarct.  The patient's history has been reviewed, patient examined, and there is no change in status.  Patient continues to be appropriate for intensive inpatient rehabilitation.  I have reviewed the patient's chart and labs.  Questions were answered to the patient's satisfaction.  [name] T 11/25/2013, 5:19 PM

## 2013-11-25 NOTE — Progress Notes (Signed)
Writer gave report to  Nurse at rehab at this time.

## 2013-11-25 NOTE — Discharge Summary (Signed)
Physician Discharge Summary  Andrea Mason QIO:[id number] DOB: 22-May-1951 DOA: 11/20/2013  PCP: Andrea Barre, MD  Admit date: 11/20/2013 Discharge date: 11/25/2013  Recommendations for Outpatient Follow-up:  1. Followup with primary care doctor within 2 weeks of discharge 2. Followup with neurology within one month of discharge  Discharge Diagnoses:  Principal Problem:   Stroke Active Problems:   HYPERTENSION   RHINOSINUSITIS, CHRONIC   Intrinsic asthma, unspecified   Hypokalemia   Syncope   Other and unspecified hyperlipidemia   Discharge Condition: Stable, improved  Diet recommendation: Dysphasia 3 within liquids, healthy heart  Wt Readings from Last 3 Encounters:  11/20/13 96.6 kg (212 lb 15.4 oz)  11/14/13 99.338 kg (219 lb)  10/24/13 100.245 kg (221 lb)    History of present illness:  Andrea Mason is a 62 y.o. female history of asthma, allergic sinusitis and recently being worked up for hypokalemia and patient was placed on spironolactone ( probably for hyperaldosteronism by nephrologist and have no access to nephrologist records) was brought to the ER after patient had a syncopal episode. As per the patient on 5 PM patient was getting ready to go out when patient felt something funny in her left hand and later on patient thinks she may have passed out. She did not have any tongue bite or incontinence of urine. After the incident she walked out and went to her neighbors and eventually patient was brought to the ER. CT head did not show any acute. EKG did not show any acute. Patient in the ER had 2 episodes of syncope which lasted for less than a minute. Both the time patient was trying to walk. Patient during those episodes did not have any seizure-like activities or tongue bite or incontinence of urine. On my exam patient presently is alert awake oriented to her name and place and time but looks confused and patient's family also states that she was confused. Patient denies any  headache chest pain palpitation shortness of breath nausea vomiting abdominal pain diarrhea fever chills. Patient states that of recently over the last one week she has stopped taking tramadol because it was making her feel uneasy. Patient was recently placed on Augmentin and prednisone for sinusitis.  Hospital Course:  Stroke:  Andrea Mason presented with syncope, confused speech, and difficulty moving her right extremities.  She was found to have an acute left MCA territory stroke involving the insula and parietal lobe and small area in the head of the caudate on the left.  MRA demonstrated high grade stenosis or occlusion of the inferior posterior left M2 branch corresponding with the area of acute infarction.  She was seen by neurology who recommended a full dose aspirin and risk factor modification.  Her telemetry demonstrated NSR, carotid duplex < 40% stenosis bilaterall, antegrade vertebral artery flow, EEG with slowing in the left temporal region without epileptiform activity, ECHO: mild LVH, EF of 50-55%, no PFO identified.  Her cholesterol panel was at goal but she was started on atorvastatin per new AHA guilelines.  Hemoglobin A1c was 6.1.  Her blood pressure was mildly elevated while holding spironolactone but trended down to normal limits after restarting.  She was seen by physical and occupational therapy who recommended CIR.    Acute sinusitis, improving.  She completed a 7 day course of antibiotics.    Hypokalemia, resolved with supplementation. Likely related to hyperaldosteronemia.  Her spironolactone was initially held but then restarted.  Her potassium is wnl.    Asthma, stable, continue albuterol prn.  HTN, blood pressure elevated initially but normalized after restarting spironolactone.    Fall, slid to floor and hit bottom but did not hit head. Family and RN were in room when incident happened.  No pain of limbs or pelvic instability.  Consultants:  Neurology Procedures:  EEG   MRI brain MRA brain ECHO Carotid duplex Antibiotics:  augmentin 12/18 >> 12/20, restarted 12/21  Ceftriaxone 12/20 >> 12/21  Discharge Exam: Filed Vitals:   11/25/13 0713  BP: 131/90  Pulse: 73  Temp: 97.6 F (36.4 C)  Resp: 20   Filed Vitals:   11/24/13 2041 11/24/13 2129 11/25/13 0713 11/25/13 1131  BP: 159/96  131/90   Pulse: 102  73   Temp: 97.7 F (36.5 C)  97.6 F (36.4 C)   TempSrc: Oral  Oral   Resp: 20  20   Height:      Weight:      SpO2: 100% 98% 100% 98%    General: AAF, No acute distress  HEENT: NCAT, MMM  Cardiovascular: RRR, nl S1, S2 no mrg, 2+ pulses, warm extremities  Respiratory: CTAB, no increased WOB  Abdomen: NABS, soft, NT/ND  MSK: Normal tone and bulk, no LEE  Neuro: 5/5 right hand and right leg strength with persistent neglect of the right side, improved today. Tends to lean to the right side in bed. Possibly diminished sensation of the right hand and leg also. Still confused and having difficulty following commands, but speech is more fluent today  Discharge Instructions      Discharge Orders   Future Appointments Provider Department Dept Phone   12/12/2013 2:00 PM Andrea Sicks, NP [facility] [phone]   Future Orders Complete By Expires   Call MD for:  difficulty breathing, headache or visual disturbances  As directed    Call MD for:  extreme fatigue  As directed    Call MD for:  hives  As directed    Call MD for:  persistant dizziness or light-headedness  As directed    Call MD for:  persistant nausea and vomiting  As directed    Call MD for:  severe uncontrolled pain  As directed    Call MD for:  temperature >100.4  As directed    Diet - low sodium heart healthy  As directed    Discharge instructions  As directed    Comments:     Andrea Mason was hospitalized with confusion and was found to have a stroke.  She was started on full dose aspirin 325mg  daily and cholesterol medication atorvastatin 20mg  daily.  She  should follow up with neurology within 1-2 months.   Increase activity slowly  As directed        Medication List    STOP taking these medications       amoxicillin-clavulanate 875-125 MG per tablet  Commonly known as:  AUGMENTIN     cyclobenzaprine 5 MG tablet  Commonly known as:  FLEXERIL     guaiFENesin 600 MG 12 hr tablet  Commonly known as:  MUCINEX     HYDROcodone-homatropine 5-1.5 MG/5ML syrup  Commonly known as:  HYCODAN     oxymetazoline 0.05 % nasal spray  Commonly known as:  AFRIN      TAKE these medications       albuterol 108 (90 BASE) MCG/ACT inhaler  Commonly known as:  PROAIR HFA  Inhale 2 puffs into the lungs every 6 (six) hours as needed for wheezing. 2 puffs every 4 hours  as needed only  if your can't catch your breath     aspirin 325 MG EC tablet  Take 1 tablet (325 mg total) by mouth daily.     atorvastatin 20 MG tablet  Commonly known as:  LIPITOR  Take 1 tablet (20 mg total) by mouth daily at 6 PM.     Biotin 5000 MCG Caps  Take 1 capsule by mouth daily.     budesonide-formoterol 80-4.5 MCG/ACT inhaler  Commonly known as:  SYMBICORT  Take 2 puffs first thing in am and then another 2 puffs about 12 hours later.     cetirizine 10 MG tablet  Commonly known as:  ZYRTEC  Take 10 mg by mouth at bedtime.     fluticasone 50 MCG/ACT nasal spray  Commonly known as:  FLONASE  Place 2 sprays into both nostrils 2 (two) times daily.     montelukast 10 MG tablet  Commonly known as:  SINGULAIR  Take 1 tablet (10 mg total) by mouth at bedtime.     omeprazole 20 MG capsule  Commonly known as:  PRILOSEC  Take 1 capsule (20 mg total) by mouth every morning.     ranitidine 150 MG tablet  Commonly known as:  ZANTAC  Take 150 mg by mouth 2 (two) times daily.     sodium chloride 0.65 % nasal spray  Commonly known as:  OCEAN  Place 2 sprays into the nose as needed for congestion.     spironolactone 25 MG tablet  Commonly known as:  ALDACTONE  Take  25 mg by mouth daily.     traMADol 50 MG tablet  Commonly known as:  ULTRAM  1-2 every 4 hours as needed for cough or pain     zinc gluconate 50 MG tablet  Take 50 mg by mouth daily.       Follow-up Information   Follow up with Andrea Barre, MD. Schedule an appointment as soon as possible for a visit in 2 weeks.   Specialties:  Internal Medicine, Radiology   Contact information:   [address] [place] [place] [address] [phone]       Follow up with Gates Rigg, MD. Schedule an appointment as soon as possible for a visit in 1 month.   Specialties:  Neurology, Radiology   Contact information:   [address] [place] [address] [phone]       The results of significant diagnostics from this hospitalization (including imaging, microbiology, ancillary and laboratory) are listed below for reference.    Significant Diagnostic Studies: Ct Head Wo Contrast  11/20/2013   CLINICAL DATA:  Altered mental status; syncope  EXAM: CT HEAD WITHOUT CONTRAST  TECHNIQUE: Contiguous axial images were obtained from the base of the skull through the vertex without intravenous contrast. Study was obtained within 24 hr of patient's arrival at the emergency department.  COMPARISON:  October 09, 2011  FINDINGS: The ventricles are normal in size and configuration. There is no mass, hemorrhage, extra-axial fluid collection, or midline shift. Gray-white compartments are normal. There is no demonstrable acute infarct.  Bony calvarium appears intact. The mastoid air cells are clear. There is diffuse opacification of all paranasal sinuses.  IMPRESSION: Pansinusitis, extensive.  Study otherwise unremarkable.   Electronically Signed   By: Bretta Bang M.D.   On: 11/20/2013 18:27   Mr Maxine Glenn Head Wo Contrast  11/22/2013   CLINICAL DATA:  Right-sided weakness.  Acute cerebral infarct.  EXAM: MRA HEAD  WITHOUT CONTRAST  TECHNIQUE: Angiographic images of the Circle of Willis were  obtained using MRA technique without intravenous contrast.  COMPARISON:  MRI brain 11/21/2013  FINDINGS: The internal carotid arteries demonstrate slight irregularity through the cavernous segments. A tiny inferior outpouching in the left posterior communicating artery likely represents a small infundibulum. A follow-up exam at 1 year is recommended to assure stability and lack of growth.  There is mild narrowing of the A1 segments bilaterally. The right M1 segment is normal. There is some narrowing of the left M1 segment without a focal stenosis. A high-grade stenosis or focal occlusion is present proximally in the inferior posterior left M2 branch corresponding to the area of acute infarction. There is asymmetric attenuation of the more distal anterior superior left M2 branch. Minimal distal branch vessel disease is present on the right.  The vertebral arteries are codominant. The right PICA origin is visualized and normal. The left AICA is dominant. The basilar artery is within normal limits. Both posterior cerebral arteries originate from the basilar tip. There is mild attenuation of distal PCA branch vessels.  IMPRESSION: 1. High-grade stenosis or likely occlusion of the inferior posterior left M2 branch corresponding to the area of acute infarction. 2. Asymmetric distal small vessel disease in the more anterior and superior left M2 branch. 3. Mild atherosclerotic irregularity within the cavernous carotid arteries and A1 segments mild laterally. 4. Mild distal small vessel disease otherwise. 5. Tiny inferior outpouching at the left posterior communicating artery likely represents a small infundibulum. A follow-up exam at 1 year is recommended to assure stability and lack of growth.   Electronically Signed   By: Gennette Pac M.D.   On: 11/22/2013 11:57   Mr Brain Wo Contrast  11/21/2013   CLINICAL DATA:  Seizure.  History of colon cancer.  EXAM: MRI HEAD WITHOUT CONTRAST  TECHNIQUE: Multiplanar, multiecho  pulse sequences of the brain and surrounding structures were obtained without intravenous contrast.  COMPARISON:  CT head 11/20/2013  FINDINGS: Sagittal T1 and diffusion-weighted imaging was performed. The patient was unable to hold still and tolerate further imaging.  Acute infarct in the left posterior insula and left parietal lobe consistent with MCA infarction. Small area of acute infarct in the head of the caudate on the left.  Ventricle size is normal.  No midline shift.  Extensive mucosal edema throughout the paranasal sinuses as noted on CT.  IMPRESSION: Acute infarct left MCA territory. This involves the left posterior insula and parietal lobe. Small area of acute infarct in the head of the caudate on the left.   Electronically Signed   By: Marlan Palau M.D.   On: 11/21/2013 13:52    Microbiology: No results found for this or any previous visit (from the past 240 hour(s)).   Labs: Basic Metabolic Panel:  Recent Labs Lab 11/20/13 1641 11/21/13 0830 11/22/13 0545 11/24/13 0508  NA 136 138 136 134*  K 3.1* 3.9 3.2* 4.1  CL 98 101 102 101  CO2 28 26 21 20   GLUCOSE 144* 80 99 114*  BUN 9 7 9 9   CREATININE 0.83 0.77 0.70 0.76  CALCIUM 9.0 9.4 8.3* 9.1  MG  --  2.1  --   --    Liver Function Tests:  Recent Labs Lab 11/20/13 1641 11/21/13 0830  AST 16 12  ALT 11 11  ALKPHOS 118* 124*  BILITOT 0.6 1.1  PROT 7.5 7.9  ALBUMIN 3.5 3.8   No results found for this basename: LIPASE,  AMYLASE,  in the last 168 hours No results found for this basename: AMMONIA,  in the last 168 hours CBC:  Recent Labs Lab 11/20/13 1641 11/21/13 0830 11/22/13 0545  WBC 7.7 7.9 7.7  NEUTROABS 4.3 4.5 5.0  HGB 12.9 13.8 12.5  HCT 38.2 40.2 36.6  MCV 86.8 87.0 85.5  PLT 251 248 234   Cardiac Enzymes:  Recent Labs Lab 11/22/13 0545  TROPONINI <0.30   BNP: BNP (last 3 results) No results found for this basename: PROBNP,  in the last 8760 hours CBG:  Recent Labs Lab 11/22/13 0326   GLUCAP 89    Time coordinating discharge: 45 minutes  Signed:  Alisson Rozell  [facility] 11/25/2013, 12:43 PM

## 2013-11-26 ENCOUNTER — Inpatient Hospital Stay (HOSPITAL_COMMUNITY): Payer: BC Managed Care – PPO | Admitting: Occupational Therapy

## 2013-11-26 ENCOUNTER — Inpatient Hospital Stay (HOSPITAL_COMMUNITY): Payer: BC Managed Care – PPO

## 2013-11-26 DIAGNOSIS — I10 Essential (primary) hypertension: Secondary | ICD-10-CM

## 2013-11-26 DIAGNOSIS — I6330 Cerebral infarction due to thrombosis of unspecified cerebral artery: Secondary | ICD-10-CM

## 2013-11-26 LAB — CBC WITH DIFFERENTIAL/PLATELET
Basophils Absolute: 0 10*3/uL (ref 0.0–0.1)
HCT: 37.1 % (ref 36.0–46.0)
Lymphocytes Relative: 36 % (ref 12–46)
MCHC: 33.4 g/dL (ref 30.0–36.0)
Monocytes Absolute: 0.5 10*3/uL (ref 0.1–1.0)
Neutro Abs: 3.9 10*3/uL (ref 1.7–7.7)
Platelets: 231 10*3/uL (ref 150–400)
RDW: 13.4 % (ref 11.5–15.5)
WBC: 7.9 10*3/uL (ref 4.0–10.5)

## 2013-11-26 LAB — COMPREHENSIVE METABOLIC PANEL
ALT: 10 U/L (ref 0–35)
AST: 13 U/L (ref 0–37)
Alkaline Phosphatase: 102 U/L (ref 39–117)
CO2: 25 mEq/L (ref 19–32)
Chloride: 102 mEq/L (ref 96–112)
GFR calc non Af Amer: >90 mL/min (ref >90)
Potassium: 3.8 mEq/L (ref 3.5–5.1)
Sodium: 137 mEq/L (ref 135–145)
Total Bilirubin: 0.5 mg/dL (ref 0.3–1.2)

## 2013-11-26 NOTE — Patient Care Conference (Signed)
Inpatient RehabilitationTeam Conference and Plan of Care Update Date: 11/26/2013   Time: 10:55 AM    Patient Name: Andrea Mason      Medical Record Number: [id number]  Date of Birth: May 26, 1951 Sex: Female         Room/Bed: 4M03C/4M03C-01 Payor Info: Payor: BLUE CROSS BLUE SHIELD / Plan: [employer] [place] PPO / Product Type: *No Product type* /    Admitting Diagnosis: cva  Admit Date/Time:  11/25/2013  4:40 PM Admission Comments: No comment available   Primary Diagnosis:  <principal problem not specified> Principal Problem: <principal problem not specified>  Patient Active Problem List   Diagnosis Date Noted  . CVA (cerebral infarction) 11/25/2013  . Other and unspecified hyperlipidemia 11/24/2013  . Stroke 11/21/2013  . Syncope 11/20/2013  . Cough 11/16/2013  . 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 Back pain 11/06/2013  . Hypokalemia 05/06/2013  . Family history of colon cancer 05/06/2013  . Lipoma 09/03/2012  . Hyperglycemia 08/15/2012  . Constipation 01/02/2012  . Preventative health care 05/24/2011  . 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 HYPERSOMNIA WITH SLEEP APNEA UNSPECIFIED 01/26/2011  . NASAL POLYP 07/28/2010  . FIBROIDS, UTERUS 07/27/2010  . COMPUTERIZED TOMOGRAPHY, CHEST, ABNORMAL 09/16/2009  . Seasonal and perennial allergic rhinitis 05/29/2009  . PERIPHERAL EDEMA 05/28/2009  . Intrinsic asthma, unspecified 11/23/2008  . LEG PAIN, LEFT 06/30/2008  . COLONIC POLYPS, HX OF 06/30/2008  . OBESITY 12/12/2007  . DEPRESSION 12/12/2007  . HYPERTENSION 12/12/2007  . RHINOSINUSITIS, CHRONIC 12/12/2007  . GERD 12/12/2007  . ARTHRITIS 12/12/2007    Expected Discharge Date: Expected Discharge Date: 12/16/13  Team Members Present: Physician leading conference: Dr. Faith Rogue Social Worker Present: Dossie Der, LCSW;Jenny Siboney Requejo, LCSW Nurse Present: Keturah Barre, RN PT Present: Clydene Laming, PT ([address] [place], PT) SLP Present: Maxcine Ham, SLP   Current Status/Progress Goal Weekly Team Focus  Medical   left MCA infarct with right hemiparesis and motor planning issues  stabilize medically to participate in rehab  bp, post-stroke [place] mgt   Bowel/Bladder   cont of bowel and bladder; lbm 12/23 after sorbitol, previous bm unknown.  cont of bowel and bladder  monitor for diarrhea/constipation; assess use of bsc vs. toilet   Swallow/Nutrition/ Hydration   Dys 3 textures and thin liquids with intermittent supervision  Mod I with least restrictive PO  diet tolerance, trials of advanced textures   ADL's   min assist bathing, min UB dressing, total LB dressing, max toileting, mod transfers  supervision overall  ADL retraining, RUE coordination, balance activities, perceptual activities, pt education   Mobility   mod assist basic transfer, total assist w/c, mod assist gait x 30', +2 for safety 3 steps 2 rails  supervision overall for gait x 150' , 5 steps with 2 rails, basic and car transfers  neuro re-ed, functional mobility,gait, safety, awareness, activity tolerance   Communication   Max cues for confrontational naming  Min  expressive/receptive language tasks, word-finding strategies, awareness of errors   Safety/Cognition/ Behavioral Observations  Max cues for intellectual awareness  Min  intellectual awareness, sustained attention, basic problem solving   Pain   n/a         Skin   CDI  no skin breakdown or infection  use microgard powder to control moisture in perineal area    Rehab Goals Patient on target to meet rehab goals: Yes Rehab Goals Revised: pt is just being evaluated today, day of conference *See Care Plan and progress notes for long and short-term goals.  Barriers to Discharge: awareness of deficits,     Possible Resolutions to Barriers:  family education, NMR    Discharge Planning/Teaching Needs:  Pt will have family to be with her 24/7.  CSW was not able to confirm with family whose home pt will return to  at this time, but CSW will investigate this further when family is available.  Family will have the opportunity for family education closer to d/c.   Team Discussion:  Pt is having attention and awareness issues, both physically and cognitively.  Pt also has sensory deficits and has some difficulty following directions.  Plan is to get pt to supervision level of care.  Revisions to Treatment Plan:  Pt just evaluated today.   Continued Need for Acute Rehabilitation Level of Care: The patient requires daily medical management by a physician with specialized training in physical medicine and rehabilitation for the following conditions: Daily direction of a multidisciplinary physical rehabilitation program to ensure safe treatment while eliciting the highest outcome that is of practical value to the patient.: Yes Daily medical management of patient stability for increased activity during participation in an intensive rehabilitation regime.: Yes Daily analysis of laboratory values and/or radiology reports with any subsequent need for medication adjustment of medical intervention for : Neurological problems;Other  Jovon Streetman, [place] 11/26/2013, 12:32 PM

## 2013-11-26 NOTE — Progress Notes (Signed)
Subjective/Complaints: No issues overnight. Room was a bit warm, so it was difficult to sleep. Denies pain. Ready for therapies to start this am. A 12 point review of systems has been performed and if not noted above is otherwise negative.   Objective: Vital Signs: Blood pressure 111/68, pulse 90, temperature 97.7 F (36.5 C), temperature source Oral, resp. rate 18, height 5\' 3"  (1.6 m), weight 100.064 kg (220 lb 9.6 oz), SpO2 99.00%. No results found.  Recent Labs  11/25/13 1936 11/26/13 0530  WBC 7.8 7.9  HGB 13.4 12.4  HCT 38.9 37.1  PLT 252 231    Recent Labs  11/24/13 0508 11/25/13 1936 11/26/13 0530  NA 134*  --  137  K 4.1  --  3.8  CL 101  --  102  GLUCOSE 114*  --  105*  BUN 9  --  13  CREATININE 0.76 0.84 0.74  CALCIUM 9.1  --  9.2   CBG (last 3)  No results found for this basename: GLUCAP,  in the last 72 hours  Wt Readings from Last 3 Encounters:  11/26/13 100.064 kg (220 lb 9.6 oz)  11/20/13 96.6 kg (212 lb 15.4 oz)  11/14/13 99.338 kg (219 lb)    Physical Exam:  Vitals reviewed.  General: pleasant and NAD. A little fatigued appearing  HENT: oral mucosa pink and moist. Dentition is fair  Head: Normocephalic.  Eyes: EOM are normal.  Neck: Normal range of motion. Neck supple. No thyromegaly present.  Cardiovascular: Normal rate and regular rhythm. No murmurs, rubs, gallops  Respiratory: Effort normal and breath sounds normal. No respiratory distress. No wheezes  GI: Soft. Bowel sounds are normal. She exhibits no distension. Non-distended Neurological: She is alert.  She was able to state her name but was easily distracted. RUE grossly 4/5. RLE also 4/5 but inconsistent. Has 1 to 1+/2 sensation on the right. Has right inattention. Displays automatic movement of the right side but has difficulty with planned movement. Sometimes perseverates on a given task or movement. Has difficulties with expressive language, appears apraxic, uses paraphrases at  times. No gross cranial nerve deficits.  Skin: Skin is warm and dry.  Psychiatric:  Remains somewhat anxious and distracted. Is pleasant though.   Assessment/Plan: 1. Functional deficits secondary to embolic left MCA infarct which require 3+ hours per day of interdisciplinary therapy in a comprehensive inpatient rehab setting. Physiatrist is providing close team supervision and 24 hour management of active medical problems listed below. Physiatrist and rehab team continue to assess barriers to discharge/monitor patient progress toward functional and medical goals. FIM: FIM - Bathing Bathing: 0: Activity did not occur  FIM - Upper Body Dressing/Undressing Upper body dressing/undressing: 0: Wears gown/pajamas-no public clothing FIM - Lower Body Dressing/Undressing Lower body dressing/undressing: 0: Wears gown/pajamas-no public clothing  FIM - Toileting Toileting steps completed by patient: Performs perineal hygiene Toileting: 2: Max-Patient completed 1 of 3 steps  FIM - Diplomatic Services operational officer Devices: Psychiatrist Transfers: 4-To toilet/BSC: Min A (steadying Pt. > 75%);4-From toilet/BSC: Min A (steadying Pt. > 75%)  FIM - Bed/Chair Transfer Bed/Chair Transfer Assistive Devices: Bed rails Bed/Chair Transfer: 4: Bed > Chair or W/C: Min A (steadying Pt. > 75%)  FIM - Locomotion: Wheelchair Locomotion: Wheelchair: 0: Activity did not occur FIM - Locomotion: Ambulation Locomotion: Ambulation: 0: Activity did not occur  Comprehension Comprehension Mode: Visual Comprehension: 3-Understands basic 50 - 74% of the time/requires cueing 25 - 50%  of the time  Expression  Expression Mode: Asleep Expression: 3-Expresses basic 50 - 74% of the time/requires cueing 25 - 50% of the time. Needs to repeat parts of sentences.  Social Interaction Social Interaction: 5-Interacts appropriately 90% of the time - Needs monitoring or encouragement for participation or  interaction.  Problem Solving Problem Solving: 1-Solves basic less than 25% of the time - needs direction nearly all the time or does not effectively solve problems and may need a restraint for safety  Memory Memory: 3-Recognizes or recalls 50 - 74% of the time/requires cueing 25 - 49% of the time  Medical Problem List and Plan:  1. Embolic left MCA infarct  2. DVT Prophylaxis/Anticoagulation: Subcutaneous Lovenox. Monitor platelet counts and any signs of bleeding  3. Pain Management: Tylenol as needed.  4. Neuropsych: This patient is not capable of making decisions on her own behalf.  5. Dysphagia/aphasia. Followup speech therapy. Currently maintained on mechanical soft diet  6. Hypertension. Aldactone 12.5 mg daily. Monitor with increased activity  7. Acute sinusitis. Augmentin has been completed  8. Hyperlipidemia. Lipitor  9. GERD. Protonix  10. Asthma. Continue inhalers. No complaints of shortness of breath. Anxiety could be a factor at times with breathing techniques and activity tolerance  LOS (Days) 1 A FACE TO FACE EVALUATION WAS PERFORMED  Jabriel Vanduyne T 11/26/2013 8:45 AM

## 2013-11-26 NOTE — Evaluation (Signed)
Occupational Therapy Assessment and Plan  Patient Details  Name: Andrea Mason MRN: [id number] Date of Birth: [birth year]  OT Diagnosis: apraxia, cognitive deficits, disturbance of vision, hemiplegia affecting dominant side, muscle weakness (generalized) and impaired coordination.  Rehab Potential: Rehab Potential: Good  ELOS: 12/16/13   Today's Date: 11/26/2013  Time: 0900-1000 Time Calculation (min): 60 min  Problem List:  Patient Active Problem List   Diagnosis Date Noted  . CVA (cerebral infarction) 11/25/2013  . Other and unspecified hyperlipidemia 11/24/2013  . Stroke 11/21/2013  . Syncope 11/20/2013  . Cough 11/16/2013  . 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 Back pain 11/06/2013  . Hypokalemia 05/06/2013  . Family history of colon cancer 05/06/2013  . Lipoma 09/03/2012  . Hyperglycemia 08/15/2012  . Constipation 01/02/2012  . Preventative health care 05/24/2011  . 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 HYPERSOMNIA WITH SLEEP APNEA UNSPECIFIED 01/26/2011  . NASAL POLYP 07/28/2010  . FIBROIDS, UTERUS 07/27/2010  . COMPUTERIZED TOMOGRAPHY, CHEST, ABNORMAL 09/16/2009  . Seasonal and perennial allergic rhinitis 05/29/2009  . PERIPHERAL EDEMA 05/28/2009  . Intrinsic asthma, unspecified 11/23/2008  . LEG PAIN, LEFT 06/30/2008  . COLONIC POLYPS, HX OF 06/30/2008  . OBESITY 12/12/2007  . DEPRESSION 12/12/2007  . HYPERTENSION 12/12/2007  . RHINOSINUSITIS, CHRONIC 12/12/2007  . GERD 12/12/2007  . ARTHRITIS 12/12/2007    Past Medical History:  Past Medical History  Diagnosis Date  . Hypertension   . GERD (gastroesophageal reflux disease)   . Allergic rhinitis   . Asthma   . Obesity   . Depression   . History of colonic polyps   . DJD (degenerative joint disease), cervical   . 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 Nasal sinus surgery  07/2006    Dr. Annalee Genta  . Nasal  polyp surgery  07/2006    x 2 with Dr. Annalee Genta  . Foot surgery  1998  . Tubal ligation      Assessment & Plan Clinical Impression: [name] is a 62 y.o. right-handed female with history of hypertension and asthma. Admitted 11/20/2013 with altered mental status/questionable syncopal episode and right side weakness with aphasia. MRI of the brain showed acute infarct left MCA territory as well small area of acute infarct head of the caudate on the left. MRA of the head with high grade stenosis or likely occlusion of the inferior posterior left M2 branch corresponding to the area of acute infarction. Echocardiogram with ejection fraction of 55% normal systolic function. Carotid Dopplers negative for ICA stenosis. EEG with no seizure activity noted. Neurology services consulted. Patient did not receive TPA. Aspirin added for CVA prophylaxis. Subcutaneous Lovenox for DVT prophylaxis. Placed on Augmentin for questionable sinusitis and has since been completed. Physical and occupational therapy evaluations completed 11/22/2013 with recommendations of physical medicine rehabilitation consult to consider inpatient rehabilitation services. Patient was felt to be a good candidate for inpatient rehabilitation services was admitted for comprehensive rehabilitation program.  Patient transferred to CIR on 11/25/2013 .    Patient currently requires min assist with bathing and UB dressing and  total with LB dressing basic self-care skills secondary to muscle weakness, decreased cardiorespiratoy endurance, impaired timing and sequencing, motor apraxia and decreased coordination, decreased visual perceptual skills and field cut, decreased midline orientation and decreased motor planning, decreased awareness, decreased problem solving, decreased safety awareness and decreased memory and decreased sitting balance, decreased standing balance and decreased postural control.  Prior to hospitalization, patient was independent  with self care, lived alone and was driving.  Patient will benefit from skilled intervention to increase independence with basic self-care skills prior to discharge home with care partner.  Anticipate patient will require 24 hour supervision and follow up home health.  OT - End of Session Activity Tolerance: Tolerates 10 - 20 min activity with multiple rests Endurance Deficit: Yes Endurance Deficit Description: fatigued after gait x 30' OT Assessment Rehab Potential: Good OT Patient demonstrates impairments in the following area(s): Balance;Behavior;Cognition;Endurance;Motor;Sensory;Safety;Perception;Vision OT Basic ADL's Functional Problem(s): Grooming;Bathing;Dressing;Toileting;Eating OT Transfers Functional Problem(s): Toilet;Tub/Shower OT Additional Impairment(s): Fuctional Use of Upper Extremity OT Plan OT Intensity: Minimum of 1-2 x/day, 45 to 90 minutes OT Frequency: 5 out of 7 days OT Duration/Estimated Length of Stay: 12/16/13 OT Treatment/Interventions: Balance/vestibular training;Cognitive remediation/compensation;Discharge planning;DME/adaptive equipment instruction;Functional mobility training;Neuromuscular re-education;Patient/family education;Self Care/advanced ADL retraining;Therapeutic Activities;UE/LE Coordination activities;Therapeutic Exercise;Visual/perceptual remediation/compensation OT Self Feeding Anticipated Outcome(s): set up OT Basic Self-Care Anticipated Outcome(s): supervision OT Toileting Anticipated Outcome(s): supervision OT Bathroom Transfers Anticipated Outcome(s): supervision OT Recommendation Patient destination: Home Follow Up Recommendations: Home health OT Equipment Recommended: Tub/shower bench   Skilled Therapeutic Intervention Pt seen for initial evaluation and ADL retraining with a focus on motor planning, balance, and safety awareness. Pt was very eager to participate but some of the evaluation was difficult to obtain an accurate assessment due  to patient's aphasia. She demonstrates good UE ROM and strength, but very impaired apraxia and coordination. Pt did well coming into a stand from the bed, [facility] and chair to work on LB self care.  Pt rested in chair with QRB at end of session with call light in reach. Reviewed use of call light and that pt should not get up by herself. OT Evaluation Precautions/Restrictions  Precautions Precautions: Fall Restrictions Weight Bearing Restrictions: No   Vital Signs Therapy Vitals Pulse Rate: 89 BP: 131/89 mmHg Patient Position, if appropriate: Sitting Oxygen Therapy SpO2: 97 % O2 Device: None (Room air) Pain Pain Assessment Pain Assessment: 0-10 Pain Score: 0-No pain Home Living/Prior Functioning Home Living Available Help at Discharge: Family Type of Home: Apartment Home Layout: One level Additional Comments: senior living apartments with emergency call system  Lives With: Alone Prior Function Level of Independence: Independent with basic ADLs;Independent with homemaking with ambulation;Independent with gait  Able to Take Stairs?: Yes Driving: Yes Leisure: Hobbies-yes (Comment) (church, reading, music) ADL  refer to FIM Vision/Perception  Vision - History Patient Visual Report: No change from baseline Vision - Assessment Vision Assessment: Vision impaired - to be further tested in functional context Tracking/Visual Pursuits: Impaired - to be further tested in functional context;Requires cues, head turns, or add eye shifts to track Visual Fields: Impaired - to be further tested in functional context (appears to have R visual field cut, difficult to assess with aphasia and apraxia) Depth Perception: over reaches Additional Comments: overattends to L when presented with 2 objects; unable to assess further at eval Perception Perception: Impaired Spatial Orientation: depth perception impaired Praxis Praxis: Impaired Praxis Impairment Details: Motor planning;Ideomotor   Cognition Overall Cognitive Status: Impaired/Different from baseline Arousal/Alertness: Awake/alert Orientation Level: Oriented to person;Oriented to place;Disoriented to time;Disoriented to situation Awareness Impairment: Intellectual impairment;Emergent impairment;Anticipatory impairment Problem Solving: Impaired Problem Solving Impairment: Verbal basic;Functional basic Organizing: Impaired Organizing Impairment: Functional basic;Verbal basic Behaviors: Impulsive;Perseveration Safety/Judgment: Impaired Sensation Sensation Light Touch: Impaired by gross assessment (RLE) Stereognosis: Impaired by gross assessment Hot/Cold: Not tested Proprioception: Impaired by gross assessment (impaired in BUE (RUE more impaired than L)) Coordination Gross Motor Movements are  Fluid and Coordinated: No Fine Motor Movements are Fluid and Coordinated: No Finger Nose Finger Test: over reaches and dysmetric movement with BUE, RUE more impaired Heel Shin Test: intact LLE; dysmetric with reduced excursion and speed RLE Motor  Motor Motor: Hemiplegia;Motor apraxia Mobility  Bed Mobility Bed Mobility: Sitting - Scoot to Edge of Bed Sitting - Scoot to Edge of Bed: 5: Supervision Transfers Sit to Stand: 4: Min assist Stand to Sit: 3: Mod assist  Trunk/Postural Assessment  Cervical Assessment Cervical Assessment: Within Functional Limits Thoracic Assessment Thoracic Assessment: Within Functional Limits Lumbar Assessment Lumbar Assessment: Within Functional Limits Postural Control Postural Control: Deficits on evaluation Protective Responses: delayed and inadequate  Balance Balance Balance Assessed: Yes Static Sitting Balance Static Sitting - Balance Support: Bilateral upper extremity supported;Feet supported Static Sitting - Level of Assistance: 5: Stand by assistance Dynamic Sitting Balance Dynamic Sitting - Balance Support: Bilateral upper extremity supported;Feet supported;Feet  unsupported Dynamic Sitting - Level of Assistance: 5: Stand by assistance Dynamic Sitting - Comments: able to reach out of BOS with either UE Static Standing Balance Static Standing - Balance Support: No upper extremity supported Static Standing - Level of Assistance: 4: Min assist Extremity/Trunk Assessment RUE Assessment RUE Assessment: Within Functional Limits LUE Assessment LUE Assessment: Within Functional Limits  FIM:  FIM - Grooming Grooming Steps: Wash, rinse, dry face;Wash, rinse, dry hands;Oral care, brush teeth, clean dentures (3/3/ steps) Grooming: 5: Set-up assist to apply toothpaste FIM - Bathing Bathing Steps Patient Completed: Chest;Right Arm;Left Arm;Front perineal area;Abdomen;Right upper leg;Left upper leg;Buttocks Bathing: 4: Min-Patient completes 8-9 85f 10 parts or 75+ percent FIM - Upper Body Dressing/Undressing Upper body dressing/undressing steps patient completed: Thread/unthread right bra strap;Thread/unthread left bra strap;Thread/unthread right sleeve of pullover shirt/dresss;Thread/unthread left sleeve of pullover shirt/dress;Put head through opening of pull over shirt/dress;Pull shirt over trunk Upper body dressing/undressing: 4: Min-Patient completed 75 plus % of tasks FIM - Lower Body Dressing/Undressing Lower body dressing/undressing: 1: Total-Patient completed less than 25% of tasks FIM - Toileting Toileting steps completed by patient: Performs perineal hygiene Toileting: 2: Max-Patient completed 1 of 3 steps FIM - Bed/Chair Transfer Bed/Chair Transfer: 3: Chair or W/C > Bed: Mod A (lift or lower assist);3: Sit > Supine: Mod A (lifting assist/Pt. 50-74%/lift 2 legs) FIM - Diplomatic Services operational officer Devices: Bedside commode Toilet Transfers: 3-To toilet/BSC: Mod A (lift or lower assist);3-From toilet/BSC: Mod A (lift or lower assist) FIM - Tub/Shower Transfers Tub/shower Transfers: 0-Activity did not occur or was simulated   Refer  to Care Plan for Long Term Goals  Recommendations for other services: None  Discharge Criteria: Patient will be discharged from OT if patient refuses treatment 3 consecutive times without medical reason, if treatment goals not met, if there is a change in medical status, if patient makes no progress towards goals or if patient is discharged from hospital.  The above assessment, treatment plan, treatment alternatives and goals were discussed and mutually agreed upon: by patient  [facility] 11/26/2013, 11:32 AM

## 2013-11-26 NOTE — Progress Notes (Signed)
[facility] Individual Statement of Services  Patient Name:  Andrea Mason  Date:  11/26/2013  Welcome to the [facility].  Our goal is to provide you with an individualized program based on your diagnosis and situation, designed to meet your specific needs.  With this comprehensive rehabilitation program, you will be expected to participate in at least 3 hours of rehabilitation therapies Monday-Friday, with modified therapy programming on the weekends.  Your rehabilitation program will include the following services:  Physical Therapy (PT), Occupational Therapy (OT), Speech Therapy (ST), 24 hour per day rehabilitation nursing, Therapeutic Recreaction (TR), Neuropsychology, Case Management (Social Worker), Rehabilitation Medicine, Nutrition Services and Pharmacy Services  Weekly team conferences will be held on Wednesdays to discuss your progress.  Your Social Worker will talk with you frequently to get your input and to update you on team discussions.  Team conferences with you and your family in attendance may also be held.  Expected length of stay:  3 weeks Overall anticipated outcome:  Supervision  Depending on your progress and recovery, your program may change. Your Social Worker will coordinate services and will keep you informed of any changes. Your Social Worker's name and contact numbers are listed  below.  The following services may also be recommended but are not provided by the [facility]:   Driving Evaluations  Home Health Rehabiltiation Services  Outpatient Rehabilitation Services  Vocational Rehabilitation   Arrangements will be made to provide these services after discharge if needed.  Arrangements include referral to agencies that provide these services.  Your insurance has been verified to be:  [employer] Your primary doctor is:  Dr. Oliver Barre  Pertinent information will be shared with your  doctor and your insurance company.  Social Worker:  Staci Acosta, LCSW  [phone] or [phone]  Information discussed with and copy given to patient by: Elvera Lennox, 11/26/2013, 12:07 PM

## 2013-11-26 NOTE — Progress Notes (Signed)
Social Work Assessment and Plan Social Work Assessment and Plan  Patient Details  Name: Andrea Mason MRN: [id number] Date of Birth: 1951-10-06  Today's Date: 11/26/2013  Problem List:  Patient Active Problem List   Diagnosis Date Noted  . CVA (cerebral infarction) 11/25/2013  . Other and unspecified hyperlipidemia 11/24/2013  . Stroke 11/21/2013  . Syncope 11/20/2013  . Cough 11/16/2013  . 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 Back pain 11/06/2013  . Hypokalemia 05/06/2013  . Family history of colon cancer 05/06/2013  . Lipoma 09/03/2012  . Hyperglycemia 08/15/2012  . Constipation 01/02/2012  . Preventative health care 05/24/2011  . 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 HYPERSOMNIA WITH SLEEP APNEA UNSPECIFIED 01/26/2011  . NASAL POLYP 07/28/2010  . FIBROIDS, UTERUS 07/27/2010  . COMPUTERIZED TOMOGRAPHY, CHEST, ABNORMAL 09/16/2009  . Seasonal and perennial allergic rhinitis 05/29/2009  . PERIPHERAL EDEMA 05/28/2009  . Intrinsic asthma, unspecified 11/23/2008  . LEG PAIN, LEFT 06/30/2008  . COLONIC POLYPS, HX OF 06/30/2008  . OBESITY 12/12/2007  . DEPRESSION 12/12/2007  . HYPERTENSION 12/12/2007  . RHINOSINUSITIS, CHRONIC 12/12/2007  . GERD 12/12/2007  . ARTHRITIS 12/12/2007   Past Medical History:  Past Medical History  Diagnosis Date  . Hypertension   . GERD (gastroesophageal reflux disease)   . Allergic rhinitis   . Asthma   . Obesity   . Depression   . History of colonic polyps   . DJD (degenerative joint disease), cervical   . 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 Nasal sinus surgery  07/2006    Dr. Annalee Genta  . Nasal polyp surgery  07/2006    x 2 with Dr. Annalee Genta  . Foot surgery  1998  . Tubal ligation     Social History:  reports that she has never smoked. She has never used smokeless tobacco. She reports that she does not drink alcohol or use illicit  drugs.  Family / Support Systems Marital Status: Widow/Widower How Long?: "a long time" per pt Patient Roles: Parent;Other (Comment) (daughter, sister) Children: Andrea Mason - dtr-in-law  [phone] Other Supports: mother, sisters Anticipated Caregiver: family to arrange 24/7 care - CSW to confirm who will provide this care at whose home Ability/Limitations of Caregiver: family all work but taking shifts Caregiver Availability: 24/7 Family Dynamics: Pt has designated who she would like [place] to talk to about d/c plan, but there has been some discord about this among the family  Social History Preferred language: English Religion: Baptist Read: Yes Write: Yes Employment Status: Employed Return to Work Plans: 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 Recent Psychosocial Issues: family having some discord regarding who is the main caregiver for pt Pyschiatric History: history of depression Substance Abuse History: None reported  Patient / Family Perceptions, Expectations & Goals Pt/Family understanding of illness & functional limitations: Pt understands she is on rehab to "get better" so she can go home.  She had difficulty expressing anything more. Premorbid pt/family roles/activities: Pt likes to attend church and sings in the choir.  Pt also likes to watch TV and spend time with friends and family. Anticipated changes in roles/activities/participation: Pt was independent prior to admission.  She  will need some assistance. Pt/family expectations/goals: Pt would like "to get to my own home by myself."  [employer]: None Premorbid Home Care/DME Agencies: None Transportation available at discharge: family Resource referrals recommended: Neuropsychology;Support group (specify)  Discharge Planning Living Arrangements: Alone Support Systems: Other relatives;Friends/neighbors;Church/faith community;Children Type of Residence: Private residence Insurance Resources: Media planner (specify) ([employer]) Financial Resources: Social Doctor, hospital Screen Referred: No Living Expenses: Rent Money Management: Patient Does the patient have any problems obtaining your medications?: No Home Management: Pt was independent PTA.  Will need support for this. Patient/Family Preliminary Plans: Pt will go home with a family member and the family will piece together 24/7 supervision for pt.  CSW will discuss pt's d/c plan with family. Barriers to Discharge: Steps Social Work Anticipated Follow Up Needs: HH/OP;Support Group Expected length of stay: 3 weeks  Clinical Impression CSW met with pt to introduce self and role of CSW to pt and to assess pt.  Pt was not fully able to answer all of CSW's questions.  She was able to communicate to CSW that she would like for CSW to talk with her dtr-in-law, Andrea Mason re: her d/c plan.  CSW left a message for Andrea Mason and at the time of this writing, CSW has not received a return call back. CSW will continue to follow and assess pt and talk with family about d/c planning.  Andrea Mason, [place] 11/26/2013, 12:57 PM

## 2013-11-26 NOTE — Evaluation (Signed)
Speech Language Pathology Assessment and Plan  Patient Details  Name: Andrea Mason MRN: [id number] Date of Birth: 12/08/1950  SLP Diagnosis: Aphasia;Cognitive Impairments;Dysphagia  Rehab Potential: Good ELOS: 18-21 days   Today's Date: 11/26/2013 Time: 1100-1200 Time Calculation (min): 60 min  Problem List:  Patient Active Problem List   Diagnosis Date Noted  . CVA (cerebral infarction) 11/25/2013  . Other and unspecified hyperlipidemia 11/24/2013  . Stroke 11/21/2013  . Syncope 11/20/2013  . Cough 11/16/2013  . 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 Back pain 11/06/2013  . Hypokalemia 05/06/2013  . Family history of colon cancer 05/06/2013  . Lipoma 09/03/2012  . Hyperglycemia 08/15/2012  . Constipation 01/02/2012  . Preventative health care 05/24/2011  . 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 HYPERSOMNIA WITH SLEEP APNEA UNSPECIFIED 01/26/2011  . NASAL POLYP 07/28/2010  . FIBROIDS, UTERUS 07/27/2010  . COMPUTERIZED TOMOGRAPHY, CHEST, ABNORMAL 09/16/2009  . Seasonal and perennial allergic rhinitis 05/29/2009  . PERIPHERAL EDEMA 05/28/2009  . Intrinsic asthma, unspecified 11/23/2008  . LEG PAIN, LEFT 06/30/2008  . COLONIC POLYPS, HX OF 06/30/2008  . OBESITY 12/12/2007  . DEPRESSION 12/12/2007  . HYPERTENSION 12/12/2007  . RHINOSINUSITIS, CHRONIC 12/12/2007  . GERD 12/12/2007  . ARTHRITIS 12/12/2007   Past Medical History:  Past Medical History  Diagnosis Date  . Hypertension   . GERD (gastroesophageal reflux disease)   . Allergic rhinitis   . Asthma   . Obesity   . Depression   . History of colonic polyps   . DJD (degenerative joint disease), cervical   . 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 Nasal sinus surgery  07/2006    Dr. Annalee Genta  . Nasal polyp surgery  07/2006    x 2 with Dr. Annalee Genta  . Foot surgery  1998  . Tubal ligation      Assessment / Plan /  Recommendation Clinical Impression  Pt is a 62 y.o. right-handed female with history of hypertension and asthma. Admitted 11/20/13 with AMS/questionable syncopal episode and right side weakness with aphasia. MRI of the brain showed acute infarct left MCA territory as well small area of acute infarct head of the caudate on the left. MRA of the head with high grade stenosis or likely occlusion of the inferior posterior left M2 branch corresponding to the area of acute infarction. Echocardiogram with ejection fraction of 55% normal systolic function. Carotid Dopplers negative for ICA stenosis. EEG with no seizure activity noted. Neurology services consulted. Patient did not receive TPA. Placed on Augmentin for questionable sinusitis and has since been completed. PT/OT evals completed 11/22/13 with recommendations of physical medicine rehabilitation consult to consider inpatient rehabilitation services. Patient was felt to be a good candidate for inpatient rehabilitation services was admitted for comprehensive rehabilitation program 12/23; SLP bedside swallow and cognitive-linguistic evals completed 12/24. Pt presents with oropharyngeal swallow function that appears to be grossly [facility], although pt is mildly impulsive with PO intake. Pt exhibits expressive and receptive aphasia marked by word-finding errors, semantic and phonemic paraphasias, impaired confrontational naming, and decreased ability to follow commands. Cognitively she demonstrates impaired intellectual awareness of deficits as well as decreased sustained attention, basic problem solving, and sequencing with functional tasks. Pt will benefit from SLP services to maximize safe swallowing, communication, and functional independence prior to discharge home.    SLP Assessment  Patient will need skilled Speech Lanaguage Pathology Services during CIR admission    Recommendations  Diet Recommendations: Dysphagia 3 (Mechanical  Soft);Thin liquid Liquid  Administration via: Cup;Straw Medication Administration: Whole meds with puree Supervision: Patient able to self feed;Intermittent supervision to cue for compensatory strategies Compensations: Slow rate;Small sips/bites;Check for pocketing Postural Changes and/or Swallow Maneuvers: Seated upright 90 degrees;Upright 30-60 min after meal Oral Care Recommendations: Oral care BID Patient destination: Home Follow up Recommendations: Home Health SLP;24 hour supervision/assistance Equipment Recommended: None recommended by SLP    SLP Frequency 5 out of 7 days   SLP Treatment/Interventions Cognitive remediation/compensation;Cueing hierarchy;Dysphagia/aspiration precaution training;Environmental controls;Functional tasks;Internal/external aids;Patient/family education;Speech/Language facilitation    Pain Pain Assessment Pain Assessment: No/denies pain Pain Score: 0-No pain Prior Functioning Cognitive/Linguistic Baseline: Within functional limits Type of Home: Apartment  Lives With: Alone Available Help at Discharge: Family  Short Term Goals: Week 1: SLP Short Term Goal 1 (Week 1): Pt will demonstrate effective mastication and oral clearance with regular textures with Min cues. SLP Short Term Goal 2 (Week 1): Pt will name common objects with 80% accuracy with Mod cues SLP Short Term Goal 3 (Week 1): Pt will follow one-step command during functional task with Mod cues SLP Short Term Goal 4 (Week 1): Pt will demonstrate intellectual awareness of impairments with Max cues SLP Short Term Goal 5 (Week 1): Pt will sustain attention to functional task for 10 min with Mod cues  See FIM for current functional status Refer to Care Plan for Long Term Goals  Recommendations for other services: None  Discharge Criteria: Patient will be discharged from SLP if patient refuses treatment 3 consecutive times without medical reason, if treatment goals not met, if there is a change in medical status, if patient  makes no progress towards goals or if patient is discharged from hospital.  The above assessment, treatment plan, treatment alternatives and goals were discussed and mutually agreed upon: by patient   Maxcine Ham, M.A. CCC-SLP [phone]   Maxcine Ham 11/26/2013, 12:23 PM

## 2013-11-26 NOTE — Progress Notes (Signed)
Patient information reviewed and entered into eRehab system by Ernestene Coover, RN, CRRN, PPS Coordinator.  Information including medical coding and functional independence measure will be reviewed and updated through discharge.    

## 2013-11-26 NOTE — Progress Notes (Signed)
Social Work Patient ID: Andrea Mason, female   DOB: [birth year], 62 y.o.   MRN: [id number]  CSW met with pt to complete assessment and update her on team conference.  Pt's targeted d/c date is 12-16-12 with supervision goals.  CSW attempted to call pt's family to update them, but had to leave a voicemail.  Await return call from family to update them and discuss pt's d/c plan.

## 2013-11-26 NOTE — Evaluation (Addendum)
Physical Therapy Assessment and Plan  Patient Details  Name: Andrea Mason MRN: [id number] Date of Birth: 1951-03-10  PT Diagnosis: Coordination disorder, Difficulty walking and Hemiparesis dominant Rehab Potential: Good ELOS: 18-21 days   Today's Date: 11/26/2013 Time: 0900-1000 Time Calculation (min): 60 min  Problem List:  Patient Active Problem List   Diagnosis Date Noted  . CVA (cerebral infarction) 11/25/2013  . Other and unspecified hyperlipidemia 11/24/2013  . Stroke 11/21/2013  . Syncope 11/20/2013  . Cough 11/16/2013  . 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 Back pain 11/06/2013  . Hypokalemia 05/06/2013  . Family history of colon cancer 05/06/2013  . Lipoma 09/03/2012  . Hyperglycemia 08/15/2012  . Constipation 01/02/2012  . Preventative health care 05/24/2011  . 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 HYPERSOMNIA WITH SLEEP APNEA UNSPECIFIED 01/26/2011  . NASAL POLYP 07/28/2010  . FIBROIDS, UTERUS 07/27/2010  . COMPUTERIZED TOMOGRAPHY, CHEST, ABNORMAL 09/16/2009  . Seasonal and perennial allergic rhinitis 05/29/2009  . PERIPHERAL EDEMA 05/28/2009  . Intrinsic asthma, unspecified 11/23/2008  . LEG PAIN, LEFT 06/30/2008  . COLONIC POLYPS, HX OF 06/30/2008  . OBESITY 12/12/2007  . DEPRESSION 12/12/2007  . HYPERTENSION 12/12/2007  . RHINOSINUSITIS, CHRONIC 12/12/2007  . GERD 12/12/2007  . ARTHRITIS 12/12/2007    Past Medical History:  Past Medical History  Diagnosis Date  . Hypertension   . GERD (gastroesophageal reflux disease)   . Allergic rhinitis   . Asthma   . Obesity   . Depression   . History of colonic polyps   . DJD (degenerative joint disease), cervical   . 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 Nasal sinus surgery  07/2006    Dr. Annalee Genta  . Nasal polyp surgery  07/2006    x 2 with Dr. Annalee Genta  . Foot surgery  1998  . Tubal ligation       Assessment & Plan Clinical Impression: Andrea Mason is a 62 y.o. right-handed female with history of hypertension and asthma. Admitted 11/20/2013 with altered mental status/questionable syncopal episode and right side weakness with aphasia. MRI of the brain showed acute infarct left MCA territory as well small area of acute infarct head of the caudate on the left. MRA of the head with high grade stenosis or likely occlusion of the inferior posterior left M2 branch corresponding to the area of acute infarction. Echocardiogram with ejection fraction of 55% normal systolic function. Carotid Dopplers negative for ICA stenosis. EEG with no seizure activity noted.   Patient transferred to CIR on 11/25/2013 .   Patient currently requires total +2 with mobility secondary to decreased cardiorespiratoy endurance, impaired timing and sequencing, unbalanced muscle activation, motor apraxia and decreased coordination, decreased motor planning and decreased awareness, decreased safety awareness and delayed processing.  Prior to hospitalization, patient was independent  with mobility and lived alone    in a Apartment home.  Home access is threshold.  Access to mother's home is also threshold.   .  Patient will benefit from skilled PT intervention to maximize safe functional mobility, minimize fall risk and decrease caregiver burden for planned discharge home with 24 hour supervision.  Anticipate patient will benefit from follow up HH at discharge.  PT - End of Session Activity Tolerance: Tolerates < 10 min activity, no significant change in vital signs Endurance Deficit: Yes Endurance Deficit Description: fatigued after gait x 30' PT Assessment Rehab Potential: Good Barriers to Discharge: Decreased caregiver support PT Patient demonstrates impairments in  the following area(s): Balance;Endurance;Motor;Perception;Safety;Sensory PT Transfers Functional Problem(s): Bed Mobility;Bed to Chair;Car;Furniture PT  Locomotion Functional Problem(s): Ambulation;Stairs PT Plan PT Intensity: Minimum of 1-2 x/day ,45 to 90 minutes PT Frequency: 5 out of 7 days PT Duration Estimated Length of Stay: 18-21 days PT Treatment/Interventions: Ambulation/gait training;Balance/vestibular training;Cognitive remediation/compensation;Discharge planning;Community reintegration;DME/adaptive equipment instruction;Functional mobility training;Patient/family education;Neuromuscular re-education;Psychosocial support;Splinting/orthotics;Therapeutic Exercise;Therapeutic Activities;Stair training;UE/LE Strength taining/ROM;UE/LE Coordination activities;Visual/perceptual remediation/compensation;Wheelchair propulsion/positioning PT Transfers Anticipated Outcome(s): supervision PT Locomotion Anticipated Outcome(s): supervision gait x 150' and up/dpwn 5 steps PT Recommendation Follow Up Recommendations: Home health PT Patient destination: Home (mother's) Equipment Recommended: Rolling walker with 5" wheels  Skilled Therapeutic Intervention  tx 1   today: neuromuscular re-education via forced use for trunk, RUE and RLE activation in dynamic sitting/reaching activity, and gait with weighted grocery cart.  Gait x 35' x 2 with cart, mod assist.  Coordination activity on NuSTep at level 4, x 4 minutes, bil UE and bil LE for alternating reciprocal movements.  Pt able to keep R hand on grip after placing it there with assistance.  Language deficits impact safety during mobility.   Pt left sitting up in w/c, all needs within reach, and quick release belt on, at end of session.  tx 2:  neuromuscular re-education via forced use, manual and tactile cues for: gait with RW x 87', x 30' with mod assist for balance expecially during turns; RUE use in standing, biased toward RLE for R stance stability, x 2 minutes; R and L step/taps onto 4" high step, 1 UE support.  Pt had to sit suddenly during RLE step taps, with LOB requiring assistance to  recover.  Pt left in room sitting up in w/c, all needs in place and quick release belt in place.   PT Evaluation Precautions/Restrictions Precautions Precautions: Fall Restrictions Weight Bearing Restrictions: No General   Vital SignsTherapy Vitals Pulse Rate: 89 BP: 131/89 mmHg Patient Position, if appropriate: Sitting Oxygen Therapy SpO2: 97 % O2 Device: None (Room air) Pain Pain Assessment Pain Assessment: 0-10 Pain Score: 0-No pain Home Living/Prior Functioning Home Living Type of Home: Apartment Additional Comments: senior living apartments with emergency call system Prior Function Driving: Yes Leisure: Hobbies-yes (Comment) (church, reading, music) Vision/Perception  Vision - History Patient Visual Report: No change from baseline Vision - Assessment Additional Comments: overattends to L when presented with 2 objects; unable to assess further at eval Perception Perception: Impaired  Cognition Overall Cognitive Status: Impaired/Different from baseline Arousal/Alertness: Awake/alert Orientation Level: Oriented to person;Oriented to place;Disoriented to time;Disoriented to situation Sensation Sensation Light Touch: Impaired by gross assessment (RLE) Proprioception: Not tested (due to language deficits) Coordination Heel Shin Test: intact LLE; dysmetric with reduced excursion and speed RLE Motor  Motor Motor: Hemiplegia;Motor apraxia  Mobility Bed Mobility Bed Mobility: Sitting - Scoot to Edge of Bed Sitting - Scoot to Edge of Bed: 5: Supervision Transfers Transfers: Yes (mod assist stand step transfer) Sit to Stand: 4: Min assist Stand to Sit: 3: Mod assist Locomotion  Ambulation Ambulation: Yes Ambulation/Gait Assistance: 3: Mod assist Ambulation Distance (Feet): 30 Feet Assistive device: None Ambulation/Gait Assistance Details: Manual facilitation for weight shifting Gait Gait: Yes Gait Pattern: Decreased dorsiflexion - right;Decreased weight  shift to right;Narrow base of support;Decreased trunk rotation;Lateral hip instability;Decreased step length - left;Decreased stance time - right;Step-through pattern Stairs / Additional Locomotion Stairs: Yes Stairs Assistance: 1: +2 Total assist (for safety) Stairs Assistance Details: Tactile cues for sequencing;Verbal cues for sequencing;Manual facilitation for placement Stair Management Technique: Two rails Number of Stairs:  3 Height of Stairs: 5 Corporate treasurer: Yes Wheelchair Assistance: 1: +1 Total assist Wheelchair Parts Management: Needs assistance  Trunk/Postural Assessment  Cervical Assessment Cervical Assessment: Within Functional Limits Thoracic Assessment Thoracic Assessment: Within Functional Limits Lumbar Assessment Lumbar Assessment: Within Functional Limits Postural Control Postural Control: Deficits on evaluation Protective Responses: delayed and inadequate  Balance Balance Balance Assessed: Yes Static Sitting Balance Static Sitting - Balance Support: Bilateral upper extremity supported;Feet supported Static Sitting - Level of Assistance: 5: Stand by assistance Dynamic Sitting Balance Dynamic Sitting - Balance Support: Bilateral upper extremity supported;Feet supported;Feet unsupported Dynamic Sitting - Level of Assistance: 5: Stand by assistance Dynamic Sitting - Comments: able to reach out of BOS with either UE Static Standing Balance Static Standing - Balance Support: No upper extremity supported Static Standing - Level of Assistance: 4: Min assist Extremity Assessment      RLE Assessment RLE Assessment: Exceptions to [facility] RLE Strength RLE Overall Strength Comments: in sitting, grossly hip flexion grossly 3-/5; knee ext 4/5; ankle DF with inversion 4/5 LLE Assessment LLE Assessment: Within Functional Limits  FIM:  FIM - Bed/Chair Transfer Bed/Chair Transfer: 3: Chair or W/C > Bed: Mod A (lift or lower assist);3: Sit > Supine:  Mod A (lifting assist/Pt. 50-74%/lift 2 legs) FIM - Locomotion: Wheelchair Locomotion: Wheelchair: 1: Total Assistance/staff pushes wheelchair (Pt<25%) FIM - Locomotion: Ambulation Locomotion: Ambulation Assistive Devices: Other (comment) (none) Ambulation/Gait Assistance: 3: Mod assist FIM - Locomotion: Stairs Locomotion: Building control surveyor: Radio broadcast assistant - 2 Locomotion: Stairs: 1: Two helpers   Refer to [employer] for Long Term Goals  Recommendations for other services: None  Discharge Criteria: Patient will be discharged from PT if patient refuses treatment 3 consecutive times without medical reason, if treatment goals not met, if there is a change in medical status, if patient makes no progress towards goals or if patient is discharged from hospital.  The above assessment, treatment plan, treatment alternatives and goals were discussed and mutually agreed upon: by patient  Brittony Billick 11/26/2013, 10:46 AM

## 2013-11-27 MED ORDER — WHITE PETROLATUM GEL
Status: AC
  Administered 2013-11-27: 0.2
  Filled 2013-11-27: qty 5

## 2013-11-27 MED ORDER — PANTOPRAZOLE SODIUM 40 MG PO PACK
40.0000 mg | PACK | Freq: Every day | ORAL | Status: DC
  Filled 2013-11-27 (×2): qty 20

## 2013-11-27 MED ORDER — PANTOPRAZOLE SODIUM 40 MG PO TBEC
40.0000 mg | DELAYED_RELEASE_TABLET | Freq: Every day | ORAL | Status: DC
  Administered 2013-11-27 – 2013-12-10 (×14): 40 mg via ORAL
  Filled 2013-11-27 (×17): qty 1

## 2013-11-27 NOTE — Plan of Care (Signed)
Problem: RH KNOWLEDGE DEFICIT Goal: RH STG INCREASE KNOWLEDGE OF HYPERTENSION Patient/caregiver will be able to verbalize independently methods used to maintain/manage high blood pressure at home( diet, medications, follow up with PCP,etc)  Outcome: Progressing No family present for education

## 2013-11-27 NOTE — IPOC Note (Addendum)
Overall Plan of Care [facility]) Patient Details Name: Andrea Mason MRN: [id number] DOB: Apr 12, 1951  Admitting Diagnosis: cva  Hospital Problems: Active Problems:   CVA (cerebral infarction)     Functional Problem List: Nursing Bladder;Bowel;Endurance;Medication Management;Pain;Nutrition;Safety;Skin Integrity;Perception  PT Balance;Endurance;Motor;Perception;Safety;Sensory  OT Balance;Behavior;Cognition;Endurance;Motor;Sensory;Safety;Perception;Vision  SLP Cognition;Linguistic  TR  activity tolerance, balance, safety, cognition, vision       Basic ADL's: OT Grooming;Bathing;Dressing;Toileting;Eating     Advanced  ADL's: OT       Transfers: PT Bed Mobility;Bed to Chair;Car;Furniture  OT Toilet;Tub/Shower     Locomotion: PT Ambulation;Stairs     Additional Impairments: OT Fuctional Use of Upper Extremity  SLP Swallowing;Communication;Social Cognition comprehension;expression Problem Solving;Memory;Attention;Awareness  TR      Anticipated Outcomes Item Anticipated Outcome  Self Feeding set up  Swallowing  Mod I with least restrictive PO   Basic self-care  supervision  Toileting  supervision   Bathroom Transfers supervision  Bowel/Bladder  continent of bowel and bladder modified independent  Transfers  supervision  Locomotion  supervision gait x 150' and up/dpwn 5 steps  Communication  Min  Cognition  Min  Pain  3 or less on scale of 1-10  Safety/Judgment  supervision   Therapy Plan: PT Intensity: Minimum of 1-2 x/day ,45 to 90 minutes PT Frequency: 5 out of 7 days PT Duration Estimated Length of Stay: 18-21 days OT Intensity: Minimum of 1-2 x/day, 45 to 90 minutes OT Frequency: 5 out of 7 days OT Duration/Estimated Length of Stay: 12/16/13 SLP Intensity: Minumum of 1-2 x/day, 30 to 90 minutes SLP Frequency: 5 out of 7 days SLP Duration/Estimated Length of Stay: 18-21 days       Team Interventions: Nursing Interventions Patient/Family  Education;Bladder Management;Bowel Management;Disease Management/Prevention;Pain Management;Medication Management;Discharge Planning;Psychosocial Support  PT interventions Ambulation/gait training;Balance/vestibular training;Cognitive remediation/compensation;Discharge planning;Community reintegration;DME/adaptive equipment instruction;Functional mobility training;Patient/family education;Neuromuscular re-education;Psychosocial support;Splinting/orthotics;Therapeutic Exercise;Therapeutic Activities;Stair training;UE/LE Strength taining/ROM;UE/LE Coordination activities;Visual/perceptual remediation/compensation;Wheelchair propulsion/positioning  OT Interventions Balance/vestibular training;Cognitive remediation/compensation;Discharge planning;DME/adaptive equipment instruction;Functional mobility training;Neuromuscular re-education;Patient/family education;Self Care/advanced ADL retraining;Therapeutic Activities;UE/LE Coordination activities;Therapeutic Exercise;Visual/perceptual remediation/compensation  SLP Interventions Cognitive remediation/compensation;Cueing hierarchy;Dysphagia/aspiration precaution training;Environmental controls;Functional tasks;Internal/external aids;Patient/family education;Speech/Language facilitation  TR Interventions  balance training, cognition, functional mobility, patient family education, adaptive equipment instruction,viual compensation  SW/CM Interventions Discharge Planning;Psychosocial Support;Patient/Family Education    Team Discharge Planning: Destination: PT-Home (mother's) ,OT- Home , SLP-Home Projected Follow-up: PT-Home health PT, OT-  Home health OT, SLP-Home Health SLP;24 hour supervision/assistance Projected Equipment Needs: PT-Rolling walker with 5" wheels, OT- Tub/shower bench, SLP-None recommended by SLP Equipment Details: PT- , OT-  Patient/family involved in discharge planning: PT- Patient (pt stated she wanted to go home alone to her apartment),   OT-Patient, SLP-Patient  MD ELOS: 18-21 days Medical Rehab Prognosis:  Good Assessment: The patient has been admitted for CIR therapies. The team will be addressing, functional mobility, strength, stamina, balance, safety, adaptive techniques/equipment, self-care, bowel and bladder mgt, patient and caregiver education, NMR, cognitive perceptual rx, communication, . Goals have been set at supervision for basic mobility and self-care and min assist for cognition and communication.    Ranelle Oyster, MD, FAAPMR      See Team Conference Notes for weekly updates to the plan of care

## 2013-11-27 NOTE — Progress Notes (Signed)
Subjective/Complaints: Rested well. Had a good day with therapy yesterday. Doesn'Mason report any issues.  A 12 point review of systems has been performed and if not noted above is otherwise negative.   Objective: Vital Signs: Blood pressure 114/74, pulse 74, temperature 97.7 F (36.5 C), temperature source Oral, resp. rate 18, height 5\' 3"  (1.6 m), weight 100.064 kg (220 lb 9.6 oz), SpO2 98.00%. No results found.  Recent Labs  11/25/13 1936 11/26/13 0530  WBC 7.8 7.9  HGB 13.4 12.4  HCT 38.9 37.1  PLT 252 231    Recent Labs  11/25/13 1936 11/26/13 0530  NA  --  137  K  --  3.8  CL  --  102  GLUCOSE  --  105*  BUN  --  13  CREATININE 0.84 0.74  CALCIUM  --  9.2   CBG (last 3)  No results found for this basename: GLUCAP,  in the last 72 hours  Wt Readings from Last 3 Encounters:  11/26/13 100.064 kg (220 lb 9.6 oz)  11/20/13 96.6 kg (212 lb 15.4 oz)  11/14/13 99.338 kg (219 lb)    Physical Exam:  Vitals reviewed.  General: pleasant and NAD. A little fatigued appearing  HENT: oral mucosa pink and moist. Dentition is fair  Head: Normocephalic.  Eyes: EOM are normal.  Neck: Normal range of motion. Neck supple. No thyromegaly present.  Cardiovascular: Normal rate and regular rhythm. No murmurs, rubs, gallops  Respiratory: Effort normal and breath sounds normal. No respiratory distress. No wheezes  GI: Soft. Bowel sounds are normal. She exhibits no distension. Non-distended Neurological: She is alert. Follows direction   RUE grossly 4/5. RLE also 4/5 but inconsistent. Has 1 to 1+/2 sensation on the right. Has right inattention. Better with automatic movement as opposed to planned.  Has difficulties with expressive language, appears apraxic, uses paraphrases at times. No gross cranial nerve deficits.  Skin: Skin is warm and dry.  Psychiatric:  Remains somewhat anxious and distracted. Is pleasant though.   Assessment/Plan: 1. Functional deficits secondary to embolic  left MCA infarct which require 3+ hours per day of interdisciplinary therapy in a comprehensive inpatient rehab setting. Physiatrist is providing close team supervision and 24 hour management of active medical problems listed below. Physiatrist and rehab team continue to assess barriers to discharge/monitor patient progress toward functional and medical goals.   FIM: FIM - Bathing Bathing Steps Patient Completed: Chest;Right Arm;Left Arm;Front perineal area;Abdomen;Right upper leg;Left upper leg;Buttocks Bathing: 4: Min-Patient completes 8-9 25f 10 parts or 75+ percent  FIM - Upper Body Dressing/Undressing Upper body dressing/undressing steps patient completed: Thread/unthread right bra strap;Thread/unthread left bra strap;Thread/unthread right sleeve of pullover shirt/dresss;Thread/unthread left sleeve of pullover shirt/dress;Put head through opening of pull over shirt/dress;Pull shirt over trunk Upper body dressing/undressing: 4: Min-Patient completed 75 plus % of tasks FIM - Lower Body Dressing/Undressing Lower body dressing/undressing: 1: Total-Patient completed less than 25% of tasks  FIM - Toileting Toileting steps completed by patient: Performs perineal hygiene Toileting: 2: Max-Patient completed 1 of 3 steps  FIM - Diplomatic Services operational officer Devices: Psychiatrist Transfers: 3-To toilet/BSC: Mod A (lift or lower assist);3-From toilet/BSC: Mod A (lift or lower assist)  FIM - Bed/Chair Transfer Bed/Chair Transfer Assistive Devices: Bed rails Bed/Chair Transfer: 3: Chair or W/C > Bed: Mod A (lift or lower assist);3: Sit > Supine: Mod A (lifting assist/Pt. 50-74%/lift 2 legs)  FIM - Locomotion: Wheelchair Locomotion: Wheelchair: 1: Total Assistance/staff pushes wheelchair (Pt<25%) FIM - Locomotion:  Ambulation Locomotion: Ambulation Assistive Devices: Other (comment) (none) Ambulation/Gait Assistance: 3: Mod assist Locomotion: Ambulation: 0: Activity did  not occur  Comprehension Comprehension Mode: Visual Comprehension: 3-Understands basic 50 - 74% of the time/requires cueing 25 - 50%  of the time  Expression Expression Mode: Asleep Expression: 2-Expresses basic 25 - 49% of the time/requires cueing 50 - 75% of the time. Uses single words/gestures.  Social Interaction Social Interaction: 5-Interacts appropriately 90% of the time - Needs monitoring or encouragement for participation or interaction.  Problem Solving Problem Solving: 1-Solves basic less than 25% of the time - needs direction nearly all the time or does not effectively solve problems and may need a restraint for safety  Memory Memory: 2-Recognizes or recalls 25 - 49% of the time/requires cueing 51 - 75% of the time  Medical Problem List and Plan:  1. Embolic left MCA infarct  2. DVT Prophylaxis/Anticoagulation: Subcutaneous Lovenox. Monitor platelet counts and any signs of bleeding  3. Pain Management: Tylenol as needed.  4. Neuropsych: This patient is not capable of making decisions on her own behalf.  5. Dysphagia/aphasia. Followup speech therapy. Currently maintained on mechanical soft diet  6. Hypertension. Aldactone 12.5 mg daily. Monitor with increased activity  7. Acute sinusitis. Augmentin has been completed  8. Hyperlipidemia. Lipitor  9. GERD. Protonix  10. Asthma. Continue inhalers. No complaints of shortness of breath. Anxiety could be a factor at times with breathing techniques and activity tolerance  LOS (Days) 2 A FACE TO FACE EVALUATION WAS PERFORMED  Andrea Mason 11/27/2013 7:08 AM

## 2013-11-28 ENCOUNTER — Inpatient Hospital Stay (HOSPITAL_COMMUNITY): Payer: BC Managed Care – PPO

## 2013-11-28 ENCOUNTER — Inpatient Hospital Stay (HOSPITAL_COMMUNITY): Payer: BC Managed Care – PPO | Admitting: Speech Pathology

## 2013-11-28 ENCOUNTER — Encounter: Payer: Self-pay | Admitting: Internal Medicine

## 2013-11-28 ENCOUNTER — Inpatient Hospital Stay (HOSPITAL_COMMUNITY): Payer: BC Managed Care – PPO | Admitting: Occupational Therapy

## 2013-11-28 ENCOUNTER — Telehealth: Payer: Self-pay | Admitting: Internal Medicine

## 2013-11-28 NOTE — Telephone Encounter (Signed)
Noted  

## 2013-11-28 NOTE — Progress Notes (Signed)
Physical Therapy Note  Patient Details  Name: Andrea Mason MRN: [id number] Date of Birth: 1951-04-17 Today's Date: 11/28/2013  0905-1003  58 min individual therapy 1415-1500 45 min individual therapy  No pain reported Am or PM   tx 1:  focused on neuromuscular re-education via forced use, demo, VCs and tactile cues for RUE and RLE motor control during seated alternating LE movements, R foot taps to floor targets, gait, R handwriting and gross motor tasks in standing, NuStep at level 4, using bil UEs and bil LEs, 1 cue to follow R knee visually to align knee with hip; sit>< stand without use of UEs, working on eccentric control.  Gait x 87' x 2 with RW, min guard assist; mod cues for R turns and anticipating obstacles on R.  During standing for a task writing on mirror, pt became dizzy when looking up; relieved with forward gaze.  When looking to R, pt tilts head and appears to have visual disturbances that affect scanning to R.  Pt returned to room, transferred to recliner with min guard assist.  Quick release belt applied, and all needs placed within reach.  Tx 2:  tx focused on neuromuscular re-education via forced use, manual and tactile cues, demo for RLE stance stability and RLE swing phase components, dynamic standing balance activities involving reaching out of BOS, trunk rotation, gait, seated alternating knee ext to tap floor target.  Gait with RW x 87', with weighted grocery cart x 150', with min guard assist. Up/down 5 steps x 2, 2 rails, step through on 5" high steps, step to on 7" high steps, min assist, mod cues for sequencing. R knee buckled slightly during descent, x 2, recovered independently.  Advanced gait through obstacle course without AD, min/mod assist for balance, including retrieving obstacles from floor with min assist, using deep squat position to reach floor.  [name] 11/28/2013, 8:01 AM

## 2013-11-28 NOTE — Telephone Encounter (Signed)
[id number] x1 

## 2013-11-28 NOTE — Progress Notes (Signed)
Speech Language Pathology Daily Session Note  Patient Details  Name: Andrea Mason MRN: [id number] Date of Birth: Dec 25, 1950  Today's Date: 11/28/2013 Time: 1415-1500 Time Calculation (min): 45 min  Short Term Goals: Week 1: SLP Short Term Goal 1 (Week 1): Pt will demonstrate effective mastication and oral clearance with regular textures with Min cues. SLP Short Term Goal 2 (Week 1): Pt will name common objects with 80% accuracy with Mod cues SLP Short Term Goal 3 (Week 1): Pt will follow one-step command during functional task with Mod cues SLP Short Term Goal 4 (Week 1): Pt will demonstrate intellectual awareness of impairments with Max cues SLP Short Term Goal 5 (Week 1): Pt will sustain attention to functional task for 10 min with Mod cues  Skilled Therapeutic Interventions: Skilled treatment session focused on addressing cognitive-linguistic goals.  SLP facilitated session with Mod cues to label objects, Min cues to label objects in pictures.  SLP also address phrase level description with Mod cues.  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    Patients also required Min-Mod verbal and visual cues to accurately sequence and correctly complete 2-step verbal directions.  Overall, functional gains in communication since previous session, recommend to continue with current plan of care.    FIM:  Comprehension Comprehension Mode: Auditory Comprehension: 4-Understands basic 75 - 89% of the time/requires cueing 10 - 24% of the time Expression Expression Mode: Verbal Expression: 4-Expresses basic 75 - 89% of the time/requires cueing 10 - 24% of the time. Needs helper to occlude trach/needs to repeat words. Social Interaction Social Interaction: 5-Interacts appropriately 90% of the time - Needs monitoring or encouragement for participation or interaction. Problem Solving Problem Solving: 3-Solves basic 50 - 74% of the time/requires cueing 25 - 49% of the  time Memory Memory: 3-Recognizes or recalls 50 - 74% of the time/requires cueing 25 - 49% of the time  Pain Pain Assessment Pain Assessment: No/denies pain  Therapy/Group: Individual Therapy  Charlane Ferretti., CCC-SLP [phone]  Andrea Mason 11/28/2013, 4:12 PM

## 2013-11-28 NOTE — Telephone Encounter (Signed)
Called, spoke with pt.  Reports she is in [facility] now.  She had a stroke.  She missed appt with Dr. Maple Hudson on Nov 24, 2013 bc of this.  She would like to ensure Verlon Au, Dr. Sherene Sires, and Dr. Maple Hudson are aware of this.  Advised I would send msg to them as FYI.  Note:  Pt had Med Cal scheduled with TP on Dec 12, 2013.  We have cancelled this as pt's anticipated d/c date right now is Dec 16, 2013.  Pt states she will need to wait to reschedule when she is doing better.  She did have difficulty expressing thoughts during conversation and put Nurse Tech on the phone to relay the d/c date.

## 2013-11-28 NOTE — Progress Notes (Signed)
Occupational Therapy Session Note  Patient Details  Name: Andrea Mason MRN: [id number] Date of Birth: [birth year]  Today's Date: 11/28/2013 Time: 0800-0900 Time Calculation (min): 60 min  Short Term Goals: Week 1:  OT Short Term Goal 1 (Week 1): Pt will demonstrate improved praxis by donning pants over feet without assist. OT Short Term Goal 2 (Week 1): Pt will demonstrate improved standing balance by pulling pants over hips with steadying assist. OT Short Term Goal 3 (Week 1): Pt will be able to transfer to the toilet with steadying assist with a stand pivot transfer. OT Short Term Goal 4 (Week 1): Pt will demonstrate improved RUE coordination so that she can fasten her bra in front with min assist. OT Short Term Goal 5 (Week 1): Pt will demonstrate improved sitting balance to bathe her feet with close supervision from a seated position.  Skilled Therapeutic Interventions/Progress Updates:  Patient sitting in w/c with QRB in place and eating her breakfast.  Engaged in finish breakfast, shower, dress and groom.  Focused session on activity tolerance, attention to right visual field as well as manage RUE & RLE, increase use of RUE, standing balance, safety awareness, and attention to tasks.  Patient showered on tub bench in walk in shower and stand with grab bar, compensate for RUE & RLE decreased sensation and apraxia by using her vision to watch her right hand and foot regularly, initiated use of RUE during most of her BADL tasks this am, stood without locking breaks and reviewed safety concern which patient agreed with and she practiced brakes on/off then said, "I will try to do better with that".  Patient able to problem solve several times without cues during BADL tasks.  Patient easily distracted and talking a lot which caused her to loose track of tasks.  QRB in place and patient sitting at sink brushing her teeth upon completion.  Therapy Documentation Precautions:   Precautions Precautions: Fall Restrictions Weight Bearing Restrictions: No Pain: Denies pain ADL: See FIM for current functional status  Therapy/Group: Individual Therapy  Walter Grima 11/28/2013, 11:08 AM

## 2013-11-28 NOTE — Progress Notes (Signed)
Subjective/Complaints: Up eating breakfast. Feeling good. Had a quiet day yesterday with family.  A 12 point review of systems has been performed and if not noted above is otherwise negative.   Objective: Vital Signs: Blood pressure 133/87, pulse 72, temperature 98.1 F (36.7 C), temperature source Oral, resp. rate 20, height 5\' 3"  (1.6 m), weight 100.064 kg (220 lb 9.6 oz), SpO2 100.00%. No results found.  Recent Labs  11/25/13 1936 11/26/13 0530  WBC 7.8 7.9  HGB 13.4 12.4  HCT 38.9 37.1  PLT 252 231    Recent Labs  11/25/13 1936 11/26/13 0530  NA  --  137  K  --  3.8  CL  --  102  GLUCOSE  --  105*  BUN  --  13  CREATININE 0.84 0.74  CALCIUM  --  9.2   CBG (last 3)   Recent Labs  11/27/13 0727  GLUCAP 88    Wt Readings from Last 3 Encounters:  11/26/13 100.064 kg (220 lb 9.6 oz)  11/20/13 96.6 kg (212 lb 15.4 oz)  11/14/13 99.338 kg (219 lb)    Physical Exam:  Vitals reviewed.  General: pleasant and NAD. A little fatigued appearing  HENT: oral mucosa pink and moist. Dentition is fair  Head: Normocephalic.  Eyes: EOM are normal.  Neck: Normal range of motion. Neck supple. No thyromegaly present.  Cardiovascular: Normal rate and regular rhythm. No murmurs, rubs, gallops  Respiratory: Effort normal and breath sounds normal. No respiratory distress. No wheezes  GI: Soft. Bowel sounds are normal. She exhibits no distension. Non-distended Neurological: She is alert. Follows direction   RUE grossly 4/5. RLE also 4 to 4+/5. Seems to have better volitional control. Has 1 to 1+/2 sensation on the right. Has right inattention.   Has difficulties with expressive language at times with apraxia, uses paraphrases at times. No gross cranial nerve deficits.  Skin: Skin is warm and dry.  Psychiatric:  Less anxious. Very pleasant.   Assessment/Plan: 1. Functional deficits secondary to embolic left MCA infarct which require 3+ hours per day of interdisciplinary  therapy in a comprehensive inpatient rehab setting. Physiatrist is providing close team supervision and 24 hour management of active medical problems listed below. Physiatrist and rehab team continue to assess barriers to discharge/monitor patient progress toward functional and medical goals.   FIM: FIM - Bathing Bathing Steps Patient Completed: Chest;Right Arm;Left Arm;Abdomen;Front perineal area;Buttocks;Right upper leg;Left upper leg Bathing: 4: Min-Patient completes 8-9 57f 10 parts or 75+ percent  FIM - Upper Body Dressing/Undressing Upper body dressing/undressing steps patient completed: Thread/unthread left bra strap;Thread/unthread right bra strap;Thread/unthread left sleeve of pullover shirt/dress;Thread/unthread right sleeve of pullover shirt/dresss;Put head through opening of pull over shirt/dress;Pull shirt over trunk Upper body dressing/undressing: 4: Min-Patient completed 75 plus % of tasks FIM - Lower Body Dressing/Undressing Lower body dressing/undressing: 1: Total-Patient completed less than 25% of tasks  FIM - Toileting Toileting steps completed by patient: Adjust clothing prior to toileting;Performs perineal hygiene;Adjust clothing after toileting Toileting Assistive Devices: Grab bar or rail for support Toileting: 6: More than reasonable amount of time  FIM - Diplomatic Services operational officer Devices: Grab bars;Bedside commode Toilet Transfers: 4-To toilet/BSC: Min A (steadying Pt. > 75%);4-From toilet/BSC: Min A (steadying Pt. > 75%)  FIM - Bed/Chair Transfer Bed/Chair Transfer Assistive Devices: Arm rests;Bed rails Bed/Chair Transfer: 4: Bed > Chair or W/C: Min A (steadying Pt. > 75%);4: Chair or W/C > Bed: Min A (steadying Pt. > 75%)  FIM -  Locomotion: Wheelchair Locomotion: Wheelchair: 1: Total Assistance/staff pushes wheelchair (Pt<25%) FIM - Locomotion: Ambulation Locomotion: Ambulation Assistive Devices: Other (comment) (none) Ambulation/Gait  Assistance: 3: Mod assist Locomotion: Ambulation: 0: Activity did not occur  Comprehension Comprehension Mode: Auditory Comprehension: 3-Understands basic 50 - 74% of the time/requires cueing 25 - 50%  of the time  Expression Expression Mode: Verbal Expression: 2-Expresses basic 25 - 49% of the time/requires cueing 50 - 75% of the time. Uses single words/gestures.  Social Interaction Social Interaction: 5-Interacts appropriately 90% of the time - Needs monitoring or encouragement for participation or interaction.  Problem Solving Problem Solving: 1-Solves basic less than 25% of the time - needs direction nearly all the time or does not effectively solve problems and may need a restraint for safety  Memory Memory: 2-Recognizes or recalls 25 - 49% of the time/requires cueing 51 - 75% of the time  Medical Problem List and Plan:  1. Embolic left MCA infarct  2. DVT Prophylaxis/Anticoagulation: Subcutaneous Lovenox. Monitor platelet counts and any signs of bleeding  3. Pain Management: Tylenol as needed.  4. Neuropsych: This patient is not capable of making decisions on her own behalf.  5. Dysphagia/aphasia. Followup speech therapy. Currently maintained on mechanical soft diet  6. Hypertension. Aldactone 12.5 mg daily. Monitor with increased activity  7. Acute sinusitis. Augmentin has been completed  8. Hyperlipidemia. Lipitor  9. GERD. Protonix  10. Asthma. Continue MDI's. No problems at present LOS (Days) 3 A FACE TO FACE EVALUATION WAS PERFORMED  SWARTZ,ZACHARY T 11/28/2013 8:36 AM

## 2013-11-29 ENCOUNTER — Inpatient Hospital Stay (HOSPITAL_COMMUNITY): Payer: BC Managed Care – PPO | Admitting: Occupational Therapy

## 2013-11-29 ENCOUNTER — Inpatient Hospital Stay (HOSPITAL_COMMUNITY): Payer: BC Managed Care – PPO | Admitting: Speech Pathology

## 2013-11-29 ENCOUNTER — Inpatient Hospital Stay (HOSPITAL_COMMUNITY): Payer: BC Managed Care – PPO | Admitting: Rehabilitation

## 2013-11-29 NOTE — Progress Notes (Signed)
Physical Therapy Session Note  Patient Details  Name: Andrea Mason MRN: [id number] Date of Birth: 12-03-1951  Today's Date: 11/29/2013 Time: 3086-5784 Time Calculation (min): 56 min  Short Term Goals: Week 1:  PT Short Term Goal 1 (Week 1): pt will perform basic transfer with min assist to L or R PT Short Term Goal 2 (Week 1): pt will perform gait x 100' with min assist with LRAD PT Short Term Goal 3 (Week 1): pt will ascend/descend 5 steps with 2 rails, assist of 1 person PT Short Term Goal 4 (Week 1): pt will perform bil UE task in standing with min assist for dynamic balance  PT Short Term Goal 5 (Week 1): pt will demonstrate route- finding on unit with min cues  Skilled Therapeutic Interventions/Progress Updates:   Pt received sitting in recliner in room, agreeable to therapy this afternoon.  Requested to use restroom prior to therapy session, therefore ambulated to/from restroom without AD at min/guard assist for steadying with cues for safety.  Performed all adjusting of clothing prior to and following toileting at supervision and peri care with superivision.  Performed high level gait kicking weighted box to facilitated forced use via weight shifting and weight bearing through RLE and modified SLS.  Performed at min assist with single over LOB, but able to self correct with stepping strategy.  Performed gait in hallway to ADL apt for controlled and carpeted environment without use of RW to further challenge balance.  Requires min/guard assist throughout with no overt LOB during activity.  Performed bed mobility in ADL apt to simulate home environment at supervision level.  Then ambulated to ortho gym to perform car transfer.  She was able to perform at min assist with max verbal cues for safety as she initially attempted to get into car with LLE first rather than sitting prior to getting LEs into car.  Ambulated back to room and left in recliner with all needs in reach.  See below for  details on NMR activity performed during session.   Therapy Documentation Precautions:  Precautions Precautions: Fall Restrictions Weight Bearing Restrictions: No   Vital Signs: Therapy Vitals Temp: 97.5 F (36.4 C) Temp src: Oral Pulse Rate: 86 Resp: 17 BP: 125/86 mmHg Patient Position, if appropriate: Sitting Oxygen Therapy SpO2: 100 % O2 Device: None (Room air) Pain: No c/o pain this afternoon during therapy.     Locomotion : Ambulation Ambulation/Gait Assistance: 4: Min guard    Other Treatments: Treatments Therapeutic Activity: Performed several activities to increase NMR through RLE/UE.  Started session with reaching down to R in order to increase weight shift and weight bearing through RLE and also work on increased quad control with squatting and then placing resisted clothes pins superiorly/anteriorly for increased coordination.  Then performed tapping exercises to called out targets in order to address weight shifting and weight bearing through RLE, coordination and apraxia.  Initially called out single number to tap, progressing to two then three.  Note that when performing three, she requires max verbal and tactile cues for R and L and also to repeat sequence of numbers, however she was able to perform with min to mod assist to prevent LOB.  Neuromuscular Facilitation: Right;Upper Extremity;Lower Extremity;Activity to increase coordination;Forced use;Activity to increase motor control;Activity to increase timing and sequencing;Activity to increase lateral weight shifting;Activity to increase sustained activation  See FIM for current functional status  Therapy/Group: Individual Therapy  Vista Deck 11/29/2013, 3:43 PM

## 2013-11-29 NOTE — Progress Notes (Signed)
Subjective/Complaints: Rested well. No complaints. Still frustrated by language difficulties A 12 point review of systems has been performed and if not noted above is otherwise negative.   Objective: Vital Signs: Blood pressure 127/86, pulse 72, temperature 97.5 F (36.4 C), temperature source Oral, resp. rate 18, height 5\' 3"  (1.6 m), weight 100.064 kg (220 lb 9.6 oz), SpO2 100.00%. No results found. No results found for this basename: WBC, HGB, HCT, PLT,  in the last 72 hours No results found for this basename: NA, K, CL, CO, GLUCOSE, BUN, CREATININE, CALCIUM,  in the last 72 hours CBG (last 3)   Recent Labs  11/27/13 0727  GLUCAP 88    Wt Readings from Last 3 Encounters:  11/26/13 100.064 kg (220 lb 9.6 oz)  11/20/13 96.6 kg (212 lb 15.4 oz)  11/14/13 99.338 kg (219 lb)    Physical Exam:  Vitals reviewed.  General: pleasant and NAD. A little fatigued appearing  HENT: oral mucosa pink and moist. Dentition is fair  Head: Normocephalic.  Eyes: EOM are normal.  Neck: Normal range of motion. Neck supple. No thyromegaly present.  Cardiovascular: Normal rate and regular rhythm. No murmurs, rubs, gallops  Respiratory: Effort normal and breath sounds normal. No respiratory distress. No wheezes  GI: Soft. Bowel sounds are normal. She exhibits no distension. Non-distended Neurological: She is alert. Follows direction   RUE grossly 4/5. RLE also 4 to 4+/5. Seems to have better volitional control. Has 1 to 1+/2 sensation on the right. Has right inattention.   Has difficulties with expressive language at times with apraxia, uses paraphrases at times. No gross cranial nerve deficits.  Skin: Skin is warm and dry.  Psychiatric:  Less anxious. Very pleasant.   Assessment/Plan: 1. Functional deficits secondary to embolic left MCA infarct which require 3+ hours per day of interdisciplinary therapy in a comprehensive inpatient rehab setting. Physiatrist is providing close team  supervision and 24 hour management of active medical problems listed below. Physiatrist and rehab team continue to assess barriers to discharge/monitor patient progress toward functional and medical goals.   FIM: FIM - Bathing Bathing Steps Patient Completed: Chest;Right Arm;Left Arm;Abdomen;Front perineal area;Buttocks;Right upper leg;Left upper leg;Right lower leg (including foot);Left lower leg (including foot) Bathing: 4: Steadying assist  FIM - Upper Body Dressing/Undressing Upper body dressing/undressing steps patient completed: Thread/unthread right bra strap;Hook/unhook bra;Thread/unthread right sleeve of pullover shirt/dresss;Thread/unthread left sleeve of pullover shirt/dress;Put head through opening of pull over shirt/dress;Pull shirt over trunk Upper body dressing/undressing: 4: Min-Patient completed 75 plus % of tasks FIM - Lower Body Dressing/Undressing Lower body dressing/undressing steps patient completed: Thread/unthread right underwear leg;Thread/unthread left underwear leg;Pull underwear up/down;Thread/unthread right pants leg;Thread/unthread left pants leg;Pull pants up/down;Don/Doff right shoe;Don/Doff left shoe;Fasten/unfasten right shoe;Fasten/unfasten left shoe Lower body dressing/undressing: 4: Min-Patient completed 75 plus % of tasks  FIM - Toileting Toileting steps completed by patient: Adjust clothing prior to toileting;Performs perineal hygiene;Adjust clothing after toileting Toileting Assistive Devices: Grab bar or rail for support Toileting: 6: More than reasonable amount of time  FIM - Diplomatic Services operational officer Devices: Grab bars;Bedside commode Toilet Transfers: 4-To toilet/BSC: Min A (steadying Pt. > 75%);4-From toilet/BSC: Min A (steadying Pt. > 75%)  FIM - Bed/Chair Transfer Bed/Chair Transfer Assistive Devices: Arm rests;Bed rails Bed/Chair Transfer: 4: Bed > Chair or W/C: Min A (steadying Pt. > 75%);4: Chair or W/C > Bed: Min A  (steadying Pt. > 75%)  FIM - Locomotion: Wheelchair Locomotion: Wheelchair: 1: Total Assistance/staff pushes wheelchair (Pt<25%) FIM - Locomotion:  Ambulation Locomotion: Ambulation Assistive Devices: Walker - Rolling Ambulation/Gait Assistance: 4: Min assist Locomotion: Ambulation: 2: Travels 50 - 149 ft with minimal assistance (Pt.>75%)  Comprehension Comprehension Mode: Auditory Comprehension: 4-Understands basic 75 - 89% of the time/requires cueing 10 - 24% of the time  Expression Expression Mode: Verbal Expression: 4-Expresses basic 75 - 89% of the time/requires cueing 10 - 24% of the time. Needs helper to occlude trach/needs to repeat words.  Social Interaction Social Interaction: 5-Interacts appropriately 90% of the time - Needs monitoring or encouragement for participation or interaction.  Problem Solving Problem Solving: 3-Solves basic 50 - 74% of the time/requires cueing 25 - 49% of the time  Memory Memory: 3-Recognizes or recalls 50 - 74% of the time/requires cueing 25 - 49% of the time  Medical Problem List and Plan:  1. Embolic left MCA infarct  2. DVT Prophylaxis/Anticoagulation: Subcutaneous Lovenox.   3. Pain Management: Tylenol as needed.  4. Neuropsych: This patient is not capable of making decisions on her own behalf.  5. Dysphagia/aphasia. Followup speech therapy. Currently maintained on mechanical soft diet and eating well 6. Hypertension. Aldactone 12.5 mg daily. Monitor with increased activity  7. Acute sinusitis. Augmentin has been completed  8. Hyperlipidemia. Lipitor  9. GERD. Protonix  10. Asthma. Continue MDI's. No problems at present. Chest clear LOS (Days) 4 A FACE TO FACE EVALUATION WAS PERFORMED  Aryn Safran T 11/29/2013 7:06 AM

## 2013-11-29 NOTE — Progress Notes (Signed)
Occupational Therapy Session Notes  Patient Details  Name: Andrea Mason MRN: [id number] Date of Birth: 07-31-1951  Today's Date: 11/29/2013 Time: 0900-1000 and 300-340 Time Calculation (min): 60 min and 40 min  Short Term Goals: Week 1:  OT Short Term Goal 1 (Week 1): Pt will demonstrate improved praxis by donning pants over feet without assist. OT Short Term Goal 2 (Week 1): Pt will demonstrate improved standing balance by pulling pants over hips with steadying assist. OT Short Term Goal 3 (Week 1): Pt will be able to transfer to the toilet with steadying assist with a stand pivot transfer. OT Short Term Goal 4 (Week 1): Pt will demonstrate improved RUE coordination so that she can fasten her bra in front with min assist. OT Short Term Goal 5 (Week 1): Pt will demonstrate improved sitting balance to bathe her feet with close supervision from a seated position.  Skilled Therapeutic Interventions/Progress Updates:  1)  Patient seated in recliner upon arrival.  Engaged in self care retraining to include shower, dress and groom.  Focused session on visual attention to right visual field, increased use of RUE, sit><stands, dynamic balance, attention, and problem solving.  Patient ambulated in her room to gather clothes from drawers as well as to and from shower with AD and occasionally furniture/wall walking.  Patient required mice cues to scan to right in her drawers to locate items.  Patient reminded to check the water temperature in the shower on her left side secondary to the right side sensation is either diminished or absent.  Patient attempting to use RUE with HH shower with limited success due to apraxia, decreased sensation, coordination and visual scanning to right.  Patient having difficulty fastening her bra however with time and encouragement, she was able to fasten bra.  Patient resting in recliner with all items in reach and reviewed safety plan which includes that patient does not need  to wear QRB when OOB.  Patient verbalized understanding.  2)  Patient resting in recliner upon arrival.  Engaged in forced use of RUE for functional tasks to include open/close doors, open door with a key, donn/doff gloves, open and close cabinets, etc.  Patient ambulated with HHA from her hospital room to therapy gym and to therapy kitchen.  Patient engaged in exploring contents of each cabinet as well as practiced naming items and reading.  Patient resting in recliner with all items in reach.  Therapy Documentation Precautions:  Precautions Precautions: Fall Restrictions Weight Bearing Restrictions: No Pain: Denies pain in both sessions ADL: See FIM for current functional status  Therapy/Group: Individual Therapy for both sessions  Seneca Hoback 11/29/2013, 10:03 AM

## 2013-11-29 NOTE — Progress Notes (Signed)
Speech Language Pathology Daily Session Note  Patient Details  Name: Andrea Mason MRN: [id number] Date of Birth: 22-Apr-1951  Today's Date: 11/29/2013 Time: 0800-0845 Time Calculation (min): 45 min  Short Term Goals: Week 1: SLP Short Term Goal 1 (Week 1): Pt will demonstrate effective mastication and oral clearance with regular textures with Min cues. SLP Short Term Goal 2 (Week 1): Pt will name common objects with 80% accuracy with Mod cues SLP Short Term Goal 3 (Week 1): Pt will follow one-step command during functional task with Mod cues SLP Short Term Goal 4 (Week 1): Pt will demonstrate intellectual awareness of impairments with Max cues SLP Short Term Goal 5 (Week 1): Pt will sustain attention to functional task for 10 min with Mod cues  Skilled Therapeutic Interventions: Skilled treatment intervention, focused on speech/lang and cognitive abilities provided. Patient had consumed 90% of meal, upon slp entry. Pt noted with no overt s/s of aspiration with thin liquids. Pt noted with decreased exp/rec communication. Pt followed 1-step verbal directions with 90%  accuracy, occasional min assist provided. Pt completed 4-step sequencing tasks with 90% accuracy, min assist provided. Pt noted with word finding deficits, characterized by semantic and phonemic paraphasias. Pt responded well to verbal cues to assist with communication breakdown. Pt attended to all tasks throughout therapy session, no redirections required. Slp created visual aide of patient room telephone #, to assist with word-finding deficits.   FIM:  Comprehension Comprehension Mode: Auditory Comprehension: 4-Understands basic 75 - 89% of the time/requires cueing 10 - 24% of the time Expression Expression Mode: Verbal Expression: 4-Expresses basic 75 - 89% of the time/requires cueing 10 - 24% of the time. Needs helper to occlude trach/needs to repeat words. Social Interaction Social Interaction: 5-Interacts appropriately  90% of the time - Needs monitoring or encouragement for participation or interaction. Problem Solving Problem Solving: 3-Solves basic 50 - 74% of the time/requires cueing 25 - 49% of the time Memory Memory: 4-Recognizes or recalls 75 - 89% of the time/requires cueing 10 - 24% of the time  Pain Pain Assessment Pain Assessment: No/denies pain  Therapy/Group: Individual Therapy  Karlita Lichtman, Kara Pacer 11/29/2013, 12:26 PM

## 2013-11-30 ENCOUNTER — Inpatient Hospital Stay (HOSPITAL_COMMUNITY): Payer: BC Managed Care – PPO | Admitting: Physical Therapy

## 2013-11-30 ENCOUNTER — Inpatient Hospital Stay (HOSPITAL_COMMUNITY): Payer: BC Managed Care – PPO | Admitting: Occupational Therapy

## 2013-11-30 NOTE — Progress Notes (Signed)
Occupational Therapy Session Note  Patient Details  Name: Andrea Mason MRN: [id number] Date of Birth: 10-06-1951  Today's Date: 11/30/2013 Time: 1115-1222 Time Calculation (min): 67 min  Skilled Therapeutic Interventions/Progress Updates: Patient was able to complete bathing and dressing via room shower and tub transfer bench with S for standing to wash periarea and buttocks.  Patient able to bend forward on tub transfer bench and recliner chair to wash both feet and to dress feet. Though Patient demonstrated word finding challenges, she was pleasant and talked the whole session and was able to share with this clinician her usual way of completing bathing and dressing here on the rehab unit.     Therapy Documentation Precautions:  Precautions Precautions: Fall Restrictions Weight Bearing Restrictions: No   Pain: Pain Assessment Pain Assessment: No/denies pain Pain Score: 0-No pain  See FIM for current functional status  Therapy/Group: Individual Therapy  Bud Face [facility] 11/30/2013, 2:59 PM

## 2013-11-30 NOTE — Progress Notes (Signed)
Subjective/Complaints: No problems. Excited that her right arm is working better. Denies pain, sob, cough, wheezing. A 12 point review of systems has been performed and if not noted above is otherwise negative.   Objective: Vital Signs: Blood pressure 128/84, pulse 82, temperature 97.7 F (36.5 C), temperature source Oral, resp. rate 19, height 5\' 3"  (1.6 m), weight 100.064 kg (220 lb 9.6 oz), SpO2 96.00%. No results found. No results found for this basename: WBC, HGB, HCT, PLT,  in the last 72 hours No results found for this basename: NA, K, CL, CO, GLUCOSE, BUN, CREATININE, CALCIUM,  in the last 72 hours CBG (last 3)   Recent Labs  11/29/13 0717  GLUCAP 91    Wt Readings from Last 3 Encounters:  11/26/13 100.064 kg (220 lb 9.6 oz)  11/20/13 96.6 kg (212 lb 15.4 oz)  11/14/13 99.338 kg (219 lb)    Physical Exam:  Vitals reviewed.  General: pleasant and NAD. A little fatigued appearing  HENT: oral mucosa pink and moist. Dentition is fair  Head: Normocephalic.  Eyes: EOM are normal.  Neck: Normal range of motion. Neck supple. No thyromegaly present.  Cardiovascular: Normal rate and regular rhythm. No murmurs, rubs, gallops  Respiratory: Effort normal and breath sounds normal. No respiratory distress. No wheezes  GI: Soft. Bowel sounds are normal. She exhibits no distension. Non-distended Neurological: She is alert. Follows direction   RUE grossly 4/5. RLE also 4 to 4+/5. Improving volitional control and coordination. Has 1 to 1+/2 sensation on the right. Has right inattention.   Has difficulties with expressive language at times with apraxia, uses paraphrases at times--this improving as well. No gross cranial nerve deficits.  Skin: Skin is warm and dry.  Psychiatric:  Less anxious. Very pleasant.   Assessment/Plan: 1. Functional deficits secondary to embolic left MCA infarct which require 3+ hours per day of interdisciplinary therapy in a comprehensive inpatient rehab  setting. Physiatrist is providing close team supervision and 24 hour management of active medical problems listed below. Physiatrist and rehab team continue to assess barriers to discharge/monitor patient progress toward functional and medical goals.   FIM: FIM - Bathing Bathing Steps Patient Completed: Chest;Right Arm;Left Arm;Abdomen;Front perineal area;Buttocks;Right upper leg;Left upper leg;Right lower leg (including foot);Left lower leg (including foot) Bathing: 5: Supervision: Safety issues/verbal cues  FIM - Upper Body Dressing/Undressing Upper body dressing/undressing steps patient completed: Thread/unthread right bra strap;Hook/unhook bra;Thread/unthread right sleeve of pullover shirt/dresss;Thread/unthread left sleeve of pullover shirt/dress;Put head through opening of pull over shirt/dress;Pull shirt over trunk;Thread/unthread left bra strap Upper body dressing/undressing: 5: Set-up assist to: Obtain clothing/put away FIM - Lower Body Dressing/Undressing Lower body dressing/undressing steps patient completed: Thread/unthread right underwear leg;Thread/unthread left underwear leg;Pull underwear up/down;Thread/unthread right pants leg;Thread/unthread left pants leg;Pull pants up/down;Don/Doff right shoe;Don/Doff left shoe;Fasten/unfasten right shoe;Fasten/unfasten left shoe Lower body dressing/undressing: 4: Steadying Assist  FIM - Toileting Toileting steps completed by patient: Adjust clothing prior to toileting;Performs perineal hygiene;Adjust clothing after toileting Toileting Assistive Devices: Grab bar or rail for support Toileting: 5: Supervision: Safety issues/verbal cues  FIM - Diplomatic Services operational officer Devices: Grab bars Toilet Transfers: 4-To toilet/BSC: Min A (steadying Pt. > 75%);4-From toilet/BSC: Min A (steadying Pt. > 75%)  FIM - Bed/Chair Transfer Bed/Chair Transfer Assistive Devices: Bed rails;Arm rests Bed/Chair Transfer: 4: Bed > Chair or W/C:  Min A (steadying Pt. > 75%);4: Chair or W/C > Bed: Min A (steadying Pt. > 75%)  FIM - Locomotion: Wheelchair Locomotion: Wheelchair: 1: Total Assistance/staff pushes wheelchair (  Pt<25%) FIM - Locomotion: Ambulation Locomotion: Ambulation Assistive Devices: Walker - Rolling (and without AD) Ambulation/Gait Assistance: 4: Min guard Locomotion: Ambulation: 2: Travels 50 - 149 ft with minimal assistance (Pt.>75%)  Comprehension Comprehension Mode: Auditory Comprehension: 4-Understands basic 75 - 89% of the time/requires cueing 10 - 24% of the time  Expression Expression Mode: Verbal Expression: 3-Expresses basic 50 - 74% of the time/requires cueing 25 - 50% of the time. Needs to repeat parts of sentences.  Social Interaction Social Interaction: 5-Interacts appropriately 90% of the time - Needs monitoring or encouragement for participation or interaction.  Problem Solving Problem Solving: 4-Solves basic 75 - 89% of the time/requires cueing 10 - 24% of the time  Memory Memory: 4-Recognizes or recalls 75 - 89% of the time/requires cueing 10 - 24% of the time  Medical Problem List and Plan:  1. Embolic left MCA infarct  2. DVT Prophylaxis/Anticoagulation: Subcutaneous Lovenox.   3. Pain Management: Tylenol as needed.  4. Neuropsych: This patient is not capable of making decisions on her own behalf.  5. Dysphagia/aphasia. Followup speech therapy. Currently maintained on mechanical soft diet and eating well 6. Hypertension. Aldactone 12.5 mg daily. Monitor with increased activity  7. Acute sinusitis. Augmentin has been completed  8. Hyperlipidemia. Lipitor  9. GERD. Protonix  10. Asthma. Continue MDI's. No problems at present. Chest clear LOS (Days) 5 A FACE TO FACE EVALUATION WAS PERFORMED  SWARTZ,ZACHARY T 11/30/2013 7:29 AM

## 2013-11-30 NOTE — Telephone Encounter (Signed)
That's fine - wish her well and let her know [name] should see her w/in a week of discharge with all active meds as these will be changed by the day of discharge

## 2013-11-30 NOTE — Progress Notes (Signed)
Physical Therapy Note  Patient Details  Name: Andrea Mason MRN: [id number] Date of Birth: 19-Jan-1951 Today's Date: 11/30/2013  1030 - 1110 (40 minutes) individual Pain: no reported pain Focus of treatment: Therapeutic exercise focused on RT LE strengthening/ activity tolerance; gait training Treatment: Pt up in recliner upon arrival; gait 120 feet X 2 close SBA on level surfaces (to/from gym); Nustep Level 4 x 10 minutes LEs only for general activity tolerance; up/down 4 inch step RT LE only focusing on eccentric knee extension control; gait in parallel bars (for safety) over soft surfaces forwards/backwards/sideways close SBA; single leg stance in parallel bars (< 2 seconds on right) ; returned to room with all needs within reach.     [place],JIM 11/30/2013, 10:35 AM

## 2013-11-30 NOTE — Progress Notes (Signed)
Physical Therapy Session Note  Patient Details  Name: Andrea Mason MRN: [id number] Date of Birth: 13-Oct-1951  Today's Date: 11/30/2013 Time: 4098-1191 and 1345-1430 Time Calculation (min): 63 min and 45 min  Short Term Goals: Week 1:  PT Short Term Goal 1 (Week 1): pt will perform basic transfer with min assist to L or R PT Short Term Goal 2 (Week 1): pt will perform gait x 100' with min assist with LRAD PT Short Term Goal 3 (Week 1): pt will ascend/descend 5 steps with 2 rails, assist of 1 person PT Short Term Goal 4 (Week 1): pt will perform bil UE task in standing with min assist for dynamic balance  PT Short Term Goal 5 (Week 1): pt will demonstrate route- finding on unit with min cues  Skilled Therapeutic Interventions/Progress Updates:    PM session: Pt ambulated to therapy gym from recliner chair without AD at supervision level. Pt instructed in gait sequencing and given cues for "quiet walking" as pt able to return demonstration and asks "Can you hear it?" Pt demonstrating foot slap intermittently of the RLE, but able to correct with cues as noted above. Pt completed dynamic balance activities, including standing on foam mat for proprioceptive feed back, reaching for rings outside BOS, alternating left and right extrermities and tossing them onto rungs. Pt attempted to pick rings up off the floor and reported dizziness after 2-3 rings. Pt returned to sitting on edge of mats and vitals obtained: BP: 139/86, HR: 99 BPM, O2: 96%. Dizziness subsided once in sitting. Visual testing performed. With demonstrates difficulty with smooth pursuits and is unable to track down and to the left, reports dizziniess. Pt demonstrates increased eye movements/shifts in movements when performing saccadic movements to the right. Further visual tests should be completed. Pt able to complete further tossing of the rings, but without bending over to pick them up. Pt then ambulated back to her room. Toileting  completed at supervision level. Pt completed all hygiene and seated in recliner chair with all needs met.   Therapy Documentation Precautions:  Precautions Precautions: Fall Restrictions Weight Bearing Restrictions: No General:  Pt found ambulating to bathroom with nsg upon PT entering room. Pt agreeable to therapy. Pt ambulated to PT gym without AD. Pt requires cues for safety and technique to increase stride length, hip and knee flexion of RLE.    Pain: Pain Assessment Pain Assessment: No/denies pain Pain Score: 0-No pain Mobility: Transfers Sit to Stand: 5: Supervision Sit to Stand Details: Verbal cues for precautions/safety;Verbal cues for technique;Verbal cues for sequencing Stand to Sit: 5: Supervision Stand to Sit Details (indicate cue type and reason): Verbal cues for sequencing;Verbal cues for technique;Verbal cues for precautions/safety Locomotion : Ambulation Ambulation/Gait Assistance: 5: Supervision Ambulation Distance (Feet):  (120 feet x1 and 180 feet x1 without AD at supervision level with cues) Assistive device: None Ambulation/Gait Assistance Details: Verbal cues for sequencing;Verbal cues for technique;Verbal cues for precautions/safety;Verbal cues for gait pattern Gait Gait Pattern: Decreased dorsiflexion - right;Decreased stance time - right;Decreased step length - right;Step-through pattern;Decreased hip/knee flexion - right;Decreased step length - left Stairs / Additional Locomotion Stairs Assistance: 4: Min assist Stairs Assistance Details: Tactile cues for sequencing;Verbal cues for sequencing;Verbal cues for technique;Verbal cues for precautions/safety Stair Management Technique: One rail Right;Two rails Number of Stairs: 15 (x2 reps with bilateral rails, x2 reps with a R railing) Curb: 4: Min assist (x4 reps no AD with min A and cues for sequencing and foot placement)  Balance: Static Standing Balance Static Standing - Balance Support: No upper  extremity supported (Wide BOS with eyes closed x 20 seconds with no LOB. With narrow BOS with eyes closed x20 secondswith no LOB. Attempted single leg stance on R and L extremities and pt unable to maintain. ) Static Standing - Level of Assistance: 5: Stand by assistance (close supervision) Dynamic Standing Balance Dynamic Standing - Balance Support: No upper extremity supported;Bilateral upper extremity supported (Pt completed fig 8 weaving in/out of cones and stooping to floor to pick cones up at sup level w/cues for technique. Pt completed heel to toe walking in //. Retrogait, sidestepping and backwards walking completed outide of //, x3 reps each activity. ) Dynamic Standing - Level of Assistance: 4: Min assist   Other Treatments:  Pt very motivated and hardworker. Requires cues for safety awareness and to promote increased stride length of RLE, hip and knee flexion of RLE during ambulation. Gait and higher level  balance activities performed as noted. Pt returned to room, seated in recliner chair with all need in reach.   See FIM for current functional status  Therapy/Group: Individual Therapy  Odin Mariani R 11/30/2013, 12:39 PM

## 2013-11-30 NOTE — Plan of Care (Signed)
Problem: RH BOWEL ELIMINATION Goal: RH STG MANAGE BOWEL WITH ASSISTANCE STG Manage Bowel with Modified independent Assistance.  Outcome: Not Progressing Sorbitol given last bowel movement 12/25

## 2013-12-01 ENCOUNTER — Inpatient Hospital Stay (HOSPITAL_COMMUNITY): Payer: BC Managed Care – PPO | Admitting: Physical Therapy

## 2013-12-01 ENCOUNTER — Telehealth: Payer: Self-pay | Admitting: Internal Medicine

## 2013-12-01 ENCOUNTER — Inpatient Hospital Stay (HOSPITAL_COMMUNITY): Payer: BC Managed Care – PPO

## 2013-12-01 DIAGNOSIS — I6330 Cerebral infarction due to thrombosis of unspecified cerebral artery: Secondary | ICD-10-CM

## 2013-12-01 DIAGNOSIS — I69920 Aphasia following unspecified cerebrovascular disease: Secondary | ICD-10-CM

## 2013-12-01 DIAGNOSIS — G8110 Spastic hemiplegia affecting unspecified side: Secondary | ICD-10-CM

## 2013-12-01 NOTE — Evaluation (Signed)
Recreational Therapy Assessment and Plan  Patient Details  Name: Andrea Mason MRN: [id number] Date of Birth: May 30, 1951 Today's Date: 12/01/2013  Rehab Potential: Good ELOS: 10 days   Assessment Clinical Impression:Problem List:  Patient Active Problem List    Diagnosis  Date Noted   .  CVA (cerebral infarction)  11/25/2013   .  Other and unspecified hyperlipidemia  11/24/2013   .  Stroke  11/21/2013   .  Syncope  11/20/2013   .  Cough  11/16/2013   .  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  Back pain  11/06/2013   .  Hypokalemia  05/06/2013   .  Family history of colon cancer  05/06/2013   .  Lipoma  09/03/2012   .  Hyperglycemia  08/15/2012   .  Constipation  01/02/2012   .  Preventative health care  05/24/2011   .  OSTEOARTHRITIS, CERVICAL SPINE  02/09/2011   .  HYPERSOMNIA WITH SLEEP APNEA UNSPECIFIED  01/26/2011   .  NASAL POLYP  07/28/2010   .  FIBROIDS, UTERUS  07/27/2010   .  COMPUTERIZED TOMOGRAPHY, CHEST, ABNORMAL  09/16/2009   .  Seasonal and perennial allergic rhinitis  05/29/2009   .  PERIPHERAL EDEMA  05/28/2009   .  Intrinsic asthma, unspecified  11/23/2008   .  LEG PAIN, LEFT  06/30/2008   .  COLONIC POLYPS, HX OF  06/30/2008   .  OBESITY  12/12/2007   .  DEPRESSION  12/12/2007   .  HYPERTENSION  12/12/2007   .  RHINOSINUSITIS, CHRONIC  12/12/2007   .  GERD  12/12/2007   .  ARTHRITIS  12/12/2007    Past Medical History:  Past Medical History   Diagnosis  Date   .  Hypertension    .  GERD (gastroesophageal reflux disease)    .  Allergic rhinitis    .  Asthma    .  Obesity    .  Depression    .  History of colonic polyps    .  DJD (degenerative joint disease), cervical    .  Primary hyperaldosteronism  11/06/2013    Past Surgical History:  Past Surgical History   Procedure  Laterality  Date   .  Nasal sinus surgery   07/2006     Dr. Annalee Genta   .  Nasal polyp surgery   07/2006     x 2 with Dr. Annalee Genta   .  Foot surgery   1998   .   Tubal ligation      Assessment & Plan  Clinical Impression: Andrea Mason is a 62 y.o. right-handed female with history of hypertension and asthma. Admitted 11/20/2013 with altered mental status/questionable syncopal episode and right side weakness with aphasia. MRI of the brain showed acute infarct left MCA territory as well small area of acute infarct head of the caudate on the left. MRA of the head with high grade stenosis or likely occlusion of the inferior posterior left M2 branch corresponding to the area of acute infarction. Echocardiogram with ejection fraction of 55% normal systolic function. Carotid Dopplers negative for ICA stenosis. EEG with no seizure activity noted. Neurology services consulted. Patient did not receive TPA. Aspirin added for CVA prophylaxis. Subcutaneous Lovenox for DVT prophylaxis. Placed on Augmentin for questionable sinusitis and has since been completed. Physical and occupational therapy evaluations completed 11/22/2013 with recommendations of physical medicine rehabilitation consult to consider inpatient rehabilitation services. Patient was felt to be a good  candidate for inpatient rehabilitation services was admitted for comprehensive rehabilitation program. Patient transferred to CIR on 11/25/2012.   Pt presents with decreased activity tolerance, decreased functional mobility, decreased balance, decreased coordination, decreased vision, decreased awareness, decreased problem solving, decreased memory, expressive & receptive aphasia Limiting pt's independence with leisure/community pursuits.   Leisure History/Participation Premorbid leisure interest/current participation: Garment/textile technologist - Journalist, newspaper - Shopping mall Expression Interests: Music (Comment);Singing;Dance Other Leisure Interests: Television;Reading;Cooking/Baking Leisure Participation Style: Alone;With Family/Friends Awareness of Community Resources: Excellent Psychosocial / Spiritual Spiritual  Interests: Church;Womens'Men's Groups Social interaction - Mood/Behavior: Cooperative Film/video editor for Education?: Yes Recreational Therapy Orientation Orientation -Reviewed with patient: Available activity resources Strengths/Weaknesses Patient Strengths/Abilities: Willingness to participate;Active premorbidly Patient weaknesses: Physical limitations TR Patient demonstrates impairments in the following area(s): Endurance;Safety;Motor  Plan Rec Therapy Plan Is patient appropriate for Therapeutic Recreation?: Yes Rehab Potential: Good Treatment times per week: MIn 1 time per week >20 minutes Estimated Length of Stay: 10 days TR Treatment/Interventions: Adaptive equipment instruction;1:1 session;Balance/vestibular training;Community reintegration;Functional mobility training;Patient/family education;Therapeutic activities;Recreation/leisure participation;Therapeutic exercise;UE/LE Coordination activities;Cognitive remediation/compensation  Recommendations for other services: None  Discharge Criteria: Patient will be discharged from TR if patient refuses treatment 3 consecutive times without medical reason.  If treatment goals not met, if there is a change in medical status, if patient makes no progress towards goals or if patient is discharged from hospital.  The above assessment, treatment plan, treatment alternatives and goals were discussed and mutually agreed upon: by patient  Raveena Hebdon 12/01/2013, 3:32 PM

## 2013-12-01 NOTE — Telephone Encounter (Signed)
Pt left a message on EXT [id number] Friday Dec. 26 to let Dr. Jonny Ruiz know she is in the hospital and that she had a stroke.

## 2013-12-01 NOTE — Progress Notes (Signed)
Occupational Therapy Session Note  Patient Details  Name: Andrea Mason MRN: [id number] Date of Birth: Dec 19, 1950  Today's Date: 12/01/2013 Time: 1430-1500 Time Calculation (min): 30 min  Short Term Goals: Week 1:  OT Short Term Goal 1 (Week 1): Pt will demonstrate improved praxis by donning pants over feet without assist. OT Short Term Goal 2 (Week 1): Pt will demonstrate improved standing balance by pulling pants over hips with steadying assist. OT Short Term Goal 3 (Week 1): Pt will be able to transfer to the toilet with steadying assist with a stand pivot transfer. OT Short Term Goal 4 (Week 1): Pt will demonstrate improved RUE coordination so that she can fasten her bra in front with min assist. OT Short Term Goal 5 (Week 1): Pt will demonstrate improved sitting balance to bathe her feet with close supervision from a seated position.  Skilled Therapeutic Interventions: Therapeutic activity with emphasis on dynamic sitting balance, functional mobility and endurance.  Patient demonstrated her ability to doff and don socks and shoes while sitting in recliner.  Patient required min verbal cues to slow down due to her proximity to edge of recliner however she completed task w/o evidence of loss of balance and used BUE throughout task with good coordination and fine motor control but required min assist to fasten velcro closure on right shoe.   Patient was then escorted to gym approx 100', w/o use of gait aid, where she completed 10 min of therex using NuStep, level 5, rating exertion as 12 on BORG scale.   Patient was escorted back to her room and was greeted by family members present.     Therapy Documentation Precautions:  Precautions Precautions: Fall Restrictions Weight Bearing Restrictions: No  Pain: No pain     Exercises: Cardiovascular Exercises NuStep: 10 min, level 5  Therapy/Group: Individual Therapy  Jamyra Zweig 12/01/2013, 3:20 PM

## 2013-12-01 NOTE — Progress Notes (Signed)
Subjective/Complaints: No problems overnite.  Had therapy yesterday Aphasic with word substitution A 12 point review of systems has been performed and if not noted above is otherwise negative.   Objective: Vital Signs: Blood pressure 123/79, pulse 75, temperature 97.7 F (36.5 C), temperature source Oral, resp. rate 19, height 5\' 3"  (1.6 m), weight 100.064 kg (220 lb 9.6 oz), SpO2 98.00%. No results found. No results found for this basename: WBC, HGB, HCT, PLT,  in the last 72 hours No results found for this basename: NA, K, CL, CO, GLUCOSE, BUN, CREATININE, CALCIUM,  in the last 72 hours CBG (last 3)   Recent Labs  11/29/13 0717  GLUCAP 91    Wt Readings from Last 3 Encounters:  11/26/13 100.064 kg (220 lb 9.6 oz)  11/20/13 96.6 kg (212 lb 15.4 oz)  11/14/13 99.338 kg (219 lb)    Physical Exam:  Vitals reviewed.  General: pleasant and NAD. A little fatigued appearing  HENT: oral mucosa pink and moist. Dentition is fair  Head: Normocephalic.  Eyes: EOM are normal.  Neck: Normal range of motion. Neck supple. No thyromegaly present.  Cardiovascular: Normal rate and regular rhythm. No murmurs, rubs, gallops  Respiratory: Effort normal and breath sounds normal. No respiratory distress. No wheezes  GI: Soft. Bowel sounds are normal. She exhibits no distension. Non-distended Neurological: She is alert. Follows direction   RUE grossly 4/5. RLE also 4 to 4+/5. Improving volitional control and coordination. Has LT sensation on the right. No evidence of field cut with confrontation testing.   Has difficulties with expressive language at times with apraxia, uses paraphrases at times--this improving as well. No gross cranial nerve deficits.  Skin: Skin is warm and dry.  Psychiatric:  Less anxious. Very pleasant.   Assessment/Plan: 1. Functional deficits secondary to embolic left MCA infarct which require 3+ hours per day of interdisciplinary therapy in a comprehensive inpatient  rehab setting. Physiatrist is providing close team supervision and 24 hour management of active medical problems listed below. Physiatrist and rehab team continue to assess barriers to discharge/monitor patient progress toward functional and medical goals.   FIM: FIM - Bathing Bathing Steps Patient Completed: Chest;Right Arm;Left upper leg;Right upper leg;Left Arm;Right lower leg (including foot);Abdomen;Left lower leg (including foot);Front perineal area;Buttocks Bathing: 4: Min-Patient completes 8-9 32f 10 parts or 75+ percent  FIM - Upper Body Dressing/Undressing Upper body dressing/undressing steps patient completed: Thread/unthread right bra strap;Thread/unthread left bra strap;Thread/unthread right sleeve of pullover shirt/dresss;Put head through opening of pull over shirt/dress;Thread/unthread left sleeve of pullover shirt/dress;Pull shirt over trunk Upper body dressing/undressing: 5: Set-up assist to: Obtain clothing/put away FIM - Lower Body Dressing/Undressing Lower body dressing/undressing steps patient completed: Thread/unthread right underwear leg;Thread/unthread left underwear leg;Pull underwear up/down;Thread/unthread right pants leg;Thread/unthread left pants leg;Pull pants up/down;Fasten/unfasten pants;Don/Doff right shoe;Don/Doff left shoe;Fasten/unfasten right shoe;Fasten/unfasten left shoe Lower body dressing/undressing: 5: Supervision: Safety issues/verbal cues  FIM - Toileting Toileting steps completed by patient: Adjust clothing prior to toileting;Performs perineal hygiene;Adjust clothing after toileting Toileting Assistive Devices: Grab bar or rail for support Toileting: 5: Supervision: Safety issues/verbal cues  FIM - Diplomatic Services operational officer Devices: Grab bars Toilet Transfers: 5-To toilet/BSC: Supervision (verbal cues/safety issues);5-From toilet/BSC: Supervision (verbal cues/safety issues)  FIM - Bed/Chair Transfer Bed/Chair Transfer Assistive  Devices: Bed rails;Arm rests Bed/Chair Transfer: 5: Bed > Chair or W/C: Supervision (verbal cues/safety issues);5: Chair or W/C > Bed: Supervision (verbal cues/safety issues)  FIM - Locomotion: Wheelchair Locomotion: Wheelchair: 1: Total Assistance/staff pushes wheelchair (Pt<25%) FIM -  Locomotion: Ambulation Locomotion: Ambulation Assistive Devices: Designer, industrial/product (and without AD) Ambulation/Gait Assistance: 5: Supervision Locomotion: Ambulation: 5: Travels 150 ft or more with supervision/safety issues  Comprehension Comprehension Mode: Auditory Comprehension: 4-Understands basic 75 - 89% of the time/requires cueing 10 - 24% of the time  Expression Expression Mode: Verbal Expression: 3-Expresses basic 50 - 74% of the time/requires cueing 25 - 50% of the time. Needs to repeat parts of sentences.  Social Interaction Social Interaction: 5-Interacts appropriately 90% of the time - Needs monitoring or encouragement for participation or interaction.  Problem Solving Problem Solving: 4-Solves basic 75 - 89% of the time/requires cueing 10 - 24% of the time  Memory Memory: 4-Recognizes or recalls 75 - 89% of the time/requires cueing 10 - 24% of the time  Medical Problem List and Plan:  1. Embolic left MCA infarct  2. DVT Prophylaxis/Anticoagulation: Subcutaneous Lovenox.   3. Pain Management: Tylenol as needed.  4. Neuropsych: This patient is not capable of making decisions on her own behalf.  5. Dysphagia/aphasia. Followup speech therapy. Currently maintained on mechanical soft diet and eating well 6. Hypertension. Aldactone 12.5 mg daily. Monitor with increased activity  7. Acute sinusitis. Augmentin has been completed  8. Hyperlipidemia. Lipitor  9. GERD. Protonix  10. Asthma. Continue MDI's. No problems at present. Chest clear LOS (Days) 6 A FACE TO FACE EVALUATION WAS PERFORMED  Claudette Laws E 12/01/2013 8:14 AM

## 2013-12-01 NOTE — Progress Notes (Signed)
Occupational Therapy Session Note  Patient Details  Name: Andrea Mason MRN: [id number] Date of Birth: 1951-04-11  Today's Date: 12/01/2013 Time: 1302-1400 Time Calculation (min): 58 min  Short Term Goals: Week 1:  OT Short Term Goal 1 (Week 1): Pt will demonstrate improved praxis by donning pants over feet without assist. OT Short Term Goal 2 (Week 1): Pt will demonstrate improved standing balance by pulling pants over hips with steadying assist. OT Short Term Goal 3 (Week 1): Pt will be able to transfer to the toilet with steadying assist with a stand pivot transfer. OT Short Term Goal 4 (Week 1): Pt will demonstrate improved RUE coordination so that she can fasten her bra in front with min assist. OT Short Term Goal 5 (Week 1): Pt will demonstrate improved sitting balance to bathe her feet with close supervision from a seated position.  Skilled Therapeutic Interventions/Progress Updates:    Pt seen for ADL retraining with focus on attention to right, functional use of RUE, sit<>stand, dynamic standing balance, and problem solving. Pt received sitting in recliner chair and eager to complete bathing and dressing. Ambulated to walk-in shower with AD and close supervision. Pt required cues to check water temperature with LUE d/t decreased sensation in RUE and pt reporting tingling sensation on RUE on this date. Completed bathing at supervision level with min cues for problem solving to prop feet on item to wash. All bathing items placed on R side to increase attention to R. Pt used RUE approx 50% of task with apraxia noted. Complete drying while in standing with close supervision for dynamic standing balance. During dressing, pt required increased time to hook bra and required min assist to straighten straps. Following dressing, pt completed oral care in standing at sink using RUE at a gross assist level. Pt returned to recliner chair and left sitting with all needs in reach.   Therapy  Documentation Precautions:  Precautions Precautions: Fall Restrictions Weight Bearing Restrictions: No General:   Vital Signs:   Pain: No report of pain during therapy session.   See FIM for current functional status  Therapy/Group: Individual Therapy  Daneil Dan 12/01/2013, 2:38 PM

## 2013-12-01 NOTE — Progress Notes (Signed)
Physical Therapy Session Note  Patient Details  Name: Andrea Mason MRN: [id number] Date of Birth: 07/10/1951  Today's Date: 12/01/2013 Time: 7829-5621 Time Calculation (min): 53 min  Short Term Goals: Week 1:  PT Short Term Goal 1 (Week 1): pt will perform basic transfer with min assist to L or R PT Short Term Goal 2 (Week 1): pt will perform gait x 100' with min assist with LRAD PT Short Term Goal 3 (Week 1): pt will ascend/descend 5 steps with 2 rails, assist of 1 person PT Short Term Goal 4 (Week 1): pt will perform bil UE task in standing with min assist for dynamic balance  PT Short Term Goal 5 (Week 1): pt will demonstrate route- finding on unit with min cues  Skilled Therapeutic Interventions/Progress Updates:    Pt received seated in bedside chair with friend present. Agreeable to therapy. Session addressed postural/gait stability with focus on functional standing balance. Sit<>stand x5 reps from bedside chair with supervision, cueing for setup and body mechanics; focus on controlled descent with stand>sit. Multiple bed<>chair transfers with supervision. Gait [phone]' without assistive device with supervision-min guard for stability/balance, cueing to decrease gait velocity. Performed Berg Balance Scale, scoring 39/56; see below for detailed findings. Verbally explained Berg Balance score, implications, and risk of falling. Instructed pt on importance of using call bell to transfer to bathroom to prevent falling; pt in agreement. Negotiated 12 steps with bilat rails, step-to pattern; min guard and verbal cueing for sequencing on initial 3 steps; supervision and effective carryover of sequencing on final 9 steps. Session ended in pt room, where pt was left seated in bedside chair with all needs within reach.  Therapy Documentation Precautions:  Precautions Precautions: Fall Restrictions Weight Bearing Restrictions: No Pain: Pain Assessment Pain Assessment: No/denies pain Pain Score:  0-No pain Locomotion : Ambulation Ambulation/Gait Assistance: 4: Min guard;5: Supervision  Balance: Balance Balance Assessed: Yes Standardized Balance Assessment Standardized Balance Assessment: Berg Balance Test Berg Balance Test Sit to Stand: Able to stand without using hands and stabilize independently Standing Unsupported: Able to stand safely 2 minutes Sitting with Back Unsupported but Feet Supported on Floor or Stool: Able to sit safely and securely 2 minutes Stand to Sit: Sits safely with minimal use of hands Transfers: Able to transfer safely, minor use of hands Standing Unsupported with Eyes Closed: Able to stand 10 seconds with supervision Standing Ubsupported with Feet Together: Able to place feet together independently and stand for 1 minute with supervision From Standing, Reach Forward with Outstretched Arm: Can reach forward >12 cm safely (5") From Standing Position, Pick up Object from Floor: Able to pick up shoe, needs supervision From Standing Position, Turn to Look Behind Over each Shoulder: Turn sideways only but maintains balance Turn 360 Degrees: Able to turn 360 degrees safely but slowly (8 seconds to R; 7 seconds to L) Standing Unsupported, Alternately Place Feet on Step/Stool: Needs assistance to keep from falling or unable to try (LOB to R side with LLE toe tap) Standing Unsupported, One Foot in Front: Able to take small step independently and hold 30 seconds Standing on One Leg: Tries to lift leg/unable to hold 3 seconds but remains standing independently Total Score: 39  See FIM for current functional status  Therapy/Group: Individual Therapy  Hobble, Lorenda Ishihara 12/01/2013, 10:52 AM

## 2013-12-02 ENCOUNTER — Inpatient Hospital Stay (HOSPITAL_COMMUNITY): Payer: BC Managed Care – PPO | Admitting: *Deleted

## 2013-12-02 ENCOUNTER — Inpatient Hospital Stay (HOSPITAL_COMMUNITY): Payer: BC Managed Care – PPO

## 2013-12-02 DIAGNOSIS — G8110 Spastic hemiplegia affecting unspecified side: Secondary | ICD-10-CM

## 2013-12-02 DIAGNOSIS — I10 Essential (primary) hypertension: Secondary | ICD-10-CM

## 2013-12-02 DIAGNOSIS — I69920 Aphasia following unspecified cerebrovascular disease: Secondary | ICD-10-CM

## 2013-12-02 DIAGNOSIS — I6330 Cerebral infarction due to thrombosis of unspecified cerebral artery: Secondary | ICD-10-CM

## 2013-12-02 LAB — URINALYSIS, ROUTINE W REFLEX MICROSCOPIC
Glucose, UA: NEGATIVE mg/dL
Ketones, ur: NEGATIVE mg/dL
Nitrite: NEGATIVE
Specific Gravity, Urine: 1.016 (ref 1.005–1.030)
pH: 6 (ref 5.0–8.0)

## 2013-12-02 LAB — CREATININE, SERUM: GFR calc Af Amer: >90 mL/min (ref >90)

## 2013-12-02 LAB — URINE MICROSCOPIC-ADD ON

## 2013-12-02 NOTE — Progress Notes (Signed)
Occupational Therapy Session Note  Patient Details  Name: Andrea Mason MRN: [id number] Date of Birth: December 29, 1950  Today's Date: 12/02/2013 Time: 0830-0924 Time Calculation (min): 54 min  Short Term Goals: Week 1:  OT Short Term Goal 1 (Week 1): Pt will demonstrate improved praxis by donning pants over feet without assist. OT Short Term Goal 2 (Week 1): Pt will demonstrate improved standing balance by pulling pants over hips with steadying assist. OT Short Term Goal 3 (Week 1): Pt will be able to transfer to the toilet with steadying assist with a stand pivot transfer. OT Short Term Goal 4 (Week 1): Pt will demonstrate improved RUE coordination so that she can fasten her bra in front with min assist. OT Short Term Goal 5 (Week 1): Pt will demonstrate improved sitting balance to bathe her feet with close supervision from a seated position.  Skilled Therapeutic Interventions/Progress Updates:    Pt seen for ADL retraining with focus on cognitive linguistic deficits, activity tolerance, and dynamic balance. Pt retrieved clothing items at supervision level and mod cues for word finding to identify items. Pt ambulated to shower with supervision and completed bathing at supervision with mod cues for safety as pt attempting to dry and wash feet in standing. All items place on right during bathing and no cues to locate them. Pt completed dressing while sitting in recliner chair with max cues for safety and mod cues for sequencing of tasks. Pt with limited attention requiring mod cues to redirect to dressing. Pt fatigued throughout session requiring cues to take rest breaks. Discussed energy conservation techniques with patient. Pt completed oral care in standing with supervision. At end of session pt left sitting in recliner chair with all needs in reach.   Therapy Documentation Precautions:  Precautions Precautions: Fall Restrictions Weight Bearing Restrictions: No General:   Vital Signs: Oxygen  Therapy SpO2: 99 % O2 Device: None (Room air) Pain: No report of pain during therapy session.   See FIM for current functional status  Therapy/Group: Individual Therapy  Daneil Dan 12/02/2013, 9:29 AM

## 2013-12-02 NOTE — Progress Notes (Signed)
Recreational Therapy Session Note  Patient Details  Name: Andrea Mason MRN: [id number] Date of Birth: 04/07/1951 Today's Date: 12/02/2013  Pain: no c/o Skilled Therapeutic Interventions/Progress Updates: Session focused on activity tolerance, ambulation, & discharge planning.  Pt ambulated to & from therapy gym with supervision.  Pt spent remainder of session focused on discharge planning.  Pt verbalized that she wanted to go to her mom's.  Extensive conversation with pt about discharge planning.  Will inform SW of concerns tomorrow.   Infiniti Hoefling 12/02/2013, 3:39 PM

## 2013-12-02 NOTE — Progress Notes (Signed)
Subjective/Complaints: Frequent urination, pt c/o groin "odor" Aphasic with word substitution A 12 point review of systems has been performed and if not noted above is otherwise negative.   Objective: Vital Signs: Blood pressure 121/76, pulse 81, temperature 97.5 F (36.4 C), temperature source Oral, resp. rate 19, height 5\' 3"  (1.6 m), weight 100.064 kg (220 lb 9.6 oz), SpO2 95.00%. No results found. No results found for this basename: WBC, HGB, HCT, PLT,  in the last 72 hours  Recent Labs  12/02/13 0535  CREATININE 0.73   CBG (last 3)   Recent Labs  11/29/13 0717  GLUCAP 91    Wt Readings from Last 3 Encounters:  11/26/13 100.064 kg (220 lb 9.6 oz)  11/20/13 96.6 kg (212 lb 15.4 oz)  11/14/13 99.338 kg (219 lb)    Physical Exam:  Vitals reviewed.  General: pleasant and NAD. A little fatigued appearing  HENT: oral mucosa pink and moist. Dentition is fair  Head: Normocephalic.  Eyes: EOM are normal.  Neck: Normal range of motion. Neck supple. No thyromegaly present.  Cardiovascular: Normal rate and regular rhythm. No murmurs, rubs, gallops  Respiratory: Effort normal and breath sounds normal. No respiratory distress. No wheezes  GI: Soft. Bowel sounds are normal. She exhibits no distension. Non-distended Neurological: She is alert. Follows direction   RUE grossly 4/5. RLE also 4 to 4+/5. Improving volitional control and coordination. Has LT sensation on the right. No evidence of field cut with confrontation testing.   Has difficulties with expressive language at times with apraxia, uses paraphrases at times- No gross cranial nerve deficits.  Skin: Skin is warm and dry. No perineal rash Psychiatric:  not anxious. Very pleasant.   Assessment/Plan: 1. Functional deficits secondary to embolic left MCA infarct which require 3+ hours per day of interdisciplinary therapy in a comprehensive inpatient rehab setting. Physiatrist is providing close team supervision and 24  hour management of active medical problems listed below. Physiatrist and rehab team continue to assess barriers to discharge/monitor patient progress toward functional and medical goals.   FIM: FIM - Bathing Bathing Steps Patient Completed: Chest;Right Arm;Left upper leg;Right upper leg;Left Arm;Right lower leg (including foot);Abdomen;Left lower leg (including foot);Front perineal area;Buttocks Bathing: 5: Supervision: Safety issues/verbal cues  FIM - Upper Body Dressing/Undressing Upper body dressing/undressing steps patient completed: Thread/unthread right bra strap;Thread/unthread left bra strap;Thread/unthread right sleeve of pullover shirt/dresss;Put head through opening of pull over shirt/dress;Thread/unthread left sleeve of pullover shirt/dress;Pull shirt over trunk;Hook/unhook bra Upper body dressing/undressing: 4: Min-Patient completed 75 plus % of tasks FIM - Lower Body Dressing/Undressing Lower body dressing/undressing steps patient completed: Thread/unthread right underwear leg;Thread/unthread left underwear leg;Pull underwear up/down;Thread/unthread right pants leg;Thread/unthread left pants leg;Pull pants up/down;Fasten/unfasten pants;Don/Doff left shoe;Fasten/unfasten right shoe;Fasten/unfasten left shoe;Don/Doff right sock;Don/Doff left sock Lower body dressing/undressing: 4: Min-Patient completed 75 plus % of tasks  FIM - Toileting Toileting steps completed by patient: Adjust clothing prior to toileting;Performs perineal hygiene;Adjust clothing after toileting Toileting Assistive Devices: Grab bar or rail for support Toileting: 5: Supervision: Safety issues/verbal cues  FIM - Diplomatic Services operational officer Devices: Grab bars Toilet Transfers: 5-To toilet/BSC: Supervision (verbal cues/safety issues);5-From toilet/BSC: Supervision (verbal cues/safety issues)  FIM - Press photographer Assistive Devices: Arm rests Bed/Chair Transfer: 5: Bed >  Chair or W/C: Supervision (verbal cues/safety issues);5: Chair or W/C > Bed: Supervision (verbal cues/safety issues)  FIM - Locomotion: Wheelchair Locomotion: Wheelchair: 1: Total Assistance/staff pushes wheelchair (Pt<25%) FIM - Locomotion: Ambulation Locomotion: Ambulation Assistive Devices: Designer, industrial/product (  and without AD) Ambulation/Gait Assistance: 4: Min guard;5: Supervision Locomotion: Ambulation: 2: Travels 50 - 149 ft with minimal assistance (Pt.>75%)  Comprehension Comprehension Mode: Auditory Comprehension: 4-Understands basic 75 - 89% of the time/requires cueing 10 - 24% of the time  Expression Expression Mode: Verbal Expression: 3-Expresses basic 50 - 74% of the time/requires cueing 25 - 50% of the time. Needs to repeat parts of sentences.  Social Interaction Social Interaction: 6-Interacts appropriately with others with medication or extra time (anti-anxiety, antidepressant).  Problem Solving Problem Solving: 3-Solves basic 50 - 74% of the time/requires cueing 25 - 49% of the time  Memory Memory: 4-Recognizes or recalls 75 - 89% of the time/requires cueing 10 - 24% of the time  Medical Problem List and Plan:  1. Embolic left MCA infarct  2. DVT Prophylaxis/Anticoagulation: Subcutaneous Lovenox.   3. Pain Management: Tylenol as needed.  4. Neuropsych: This patient is not capable of making decisions on her own behalf.  5. Dysphagia/aphasia. Followup speech therapy. Currently maintained on mechanical soft diet and eating well 6. Hypertension. Aldactone 12.5 mg daily. Monitor with increased activity  7. Acute sinusitis. Augmentin has been completed  8. Hyperlipidemia. Lipitor  9. GERD. Protonix  10. Asthma. Continue MDI's. No problems at present. Chest clear 11.  Urinary frequency-may be CVA related but will recheck UA, Last UA 12/18 neg LOS (Days) 7 A FACE TO FACE EVALUATION WAS PERFORMED  Erick Colace 12/02/2013 6:19 AM

## 2013-12-02 NOTE — Progress Notes (Signed)
Social Work Patient ID: Andrea Mason, female   DOB: 1951/03/04, 62 y.o.   MRN: [id number]  CSW left a second message for pt's dtr-in-law, Andrea Mason and await return call.  Pt told CSW that she plans to go to her mother's home and she will provide 24/7 supervision for pt.  Pt's mother lives in a senior living apartment complex that has 24/7 management and is single level living.  Pt gave permission for CSW to contact her mother.  Pt's mother is elderly, but lives independently.  She stated she can get meals for pt and can call for an emergency.  She uses a walker in the community, so would probably not be able to provide hands on assistance.  Pt's aunt also lives within "eye shot" of mother and will assist pt, as will other family members.  CSW will confirm plan with dtr-in-law when she returns call and will inform team of pt's current d/c plan.  Pt was able to communicate better with CSW during this visit and CSW gave pt encouragement to keep up the hard work.

## 2013-12-02 NOTE — Progress Notes (Addendum)
Physical Therapy Weekly Progress Note  Patient Details  Name: Andrea Mason MRN: [id number] Date of Birth: [birth year]  Today's Date: 12/02/2013 Time: 1000-1045 and 13:50-14:20  Time Calculation (min): 45 min and 30   Patient has met 5 of 5 short term goals.  Pt making excellent gains in motor control, coordination, strength, and balance. Pt's receptive abilities have improved as well, increasing effectiveness of instruction and safety awareness. Pt still limited with safety and balance in dynamic environments, and balance is limited by decreased RLE proprioception and coordination.   Patient continues to demonstrate the following deficits: proprioception, coordination, and strength and therefore will continue to benefit from skilled PT intervention to enhance overall performance with balance, functional use of  right lower extremity, awareness and coordination.  Patient progressing toward long term goals..  Continue plan of care.  PT Short Term Goals Week 1:  PT Short Term Goal 1 (Week 1): pt will perform basic transfer with min assist to L or R PT Short Term Goal 1 - Progress (Week 1): Met PT Short Term Goal 2 (Week 1): pt will perform gait x 100' with min assist with LRAD PT Short Term Goal 2 - Progress (Week 1): Met PT Short Term Goal 3 (Week 1): pt will ascend/descend 5 steps with 2 rails, assist of 1 person PT Short Term Goal 3 - Progress (Week 1): Met PT Short Term Goal 4 (Week 1): pt will perform bil UE task in standing with min assist for dynamic balance  PT Short Term Goal 4 - Progress (Week 1): Met PT Short Term Goal 5 (Week 1): pt will demonstrate route- finding on unit with min cues PT Short Term Goal 5 - Progress (Week 1): Met Week 2:  PT Short Term Goal 1 (Week 2): STG=LTG  Skilled Therapeutic Interventions/Progress Updates:  1:2 Tx focused on static and dynamic balance, gait in controlled setting, and NMR for coordination and timing. Pt seemed to comprehend and  appropriately respond to verbal cues 90% of the time today. Still with pronounced word substitution. Noted R ankle rolling on compliant surfaces today, pt given cues for visual checking to compensate and for safety.   Gait with no device in controlled setting with S until distracated, quick turns with min A for balance 2 x>150'   Balance tasks including each of the following:  - Tandem stance with 1UE x76min each R/L - SLS with 1 UE x65min each R/L - Tandem walking 2x25' with Mod A for steadying - Retro walking 2x25' with min A - Marching with high knees 2x25' with min A  Static standing on air-x pad x32min with min A, noted R ankle inversion and pt unaware. Pt able to correct and pronate with min cues.  Standing on air-x with ball toss x43min with min A  Discussed lifestyle and diet modification to reduce risk of future CVA. Showed pt information in binder in room for further reading and reference.  2:2 Tx focused on dynamic gait and balance, stairs, and NMR for RLE coordination Pt able to perform basic transfers with close S and safe technique.   Dynamic gait with no deivce and min-guard x>150' including changes in speed, direction, quick stops/starts, head turns, and retro walking in controlled setting. Obstacle course including cone weaving, step-overs, step-ups, ball taps, and compliant surfaces with min A overall and good recall of instructions.   Ascend/descend 12 stairs with 2 rails and min-guard with step-to pattern, pt able to recall with min cues   Seated  cone taps 2x20' with RLE for coordination and accuracy.  Pt able to easily pathfind to/from gym/room without cues.   Pt left up in recliner with all needs in reach.      Therapy Documentation Precautions:  Precautions Precautions: Fall Restrictions Weight Bearing Restrictions: No    Vital Signs: Oxygen Therapy SpO2: 99 % O2 Device: None (Room air) Pain: Pain Assessment Pain Assessment: No/denies pain Pain Score: 4   Pain Type: Acute pain Pain Location: Arm Pain Orientation: Right Pain Descriptors / Indicators: Sore Pain Frequency: Intermittent Pain Onset: Gradual Patients Stated Pain Goal: 2 Pain Intervention(s): Medication (See eMAR)  See FIM for current functional status  Therapy/Group: Individual Therapy  Clydene Laming, PT, DPT  12/02/2013, 12:01 PM

## 2013-12-02 NOTE — Progress Notes (Signed)
Speech Language Pathology Daily Session Note  Patient Details  Name: Ida Milbrath MRN: [id number] Date of Birth: 11-24-1951  Today's Date: 12/02/2013 Time: 1000-1045 Time Calculation (min): 45 min  Short Term Goals: Week 1: SLP Short Term Goal 1 (Week 1): Pt will demonstrate effective mastication and oral clearance with regular textures with Min cues. SLP Short Term Goal 2 (Week 1): Pt will name common objects with 80% accuracy with Mod cues SLP Short Term Goal 3 (Week 1): Pt will follow one-step command during functional task with Mod cues SLP Short Term Goal 4 (Week 1): Pt will demonstrate intellectual awareness of impairments with Max cues SLP Short Term Goal 5 (Week 1): Pt will sustain attention to functional task for 10 min with Mod cues  Skilled Therapeutic Interventions: Treatment focused on cognitive-linguistic goals. SLP facilitated session with Mod cues for recall of therapy tasks from previous day. SLP also facilitated creation of external memory aid with written model, which pt copied with Mod I and extra time for self-regulation and correction of errors. Pt labeled letters accurately as she re-wrote note. Pt required Mod cues for basic problem solving throughout functional tasks. She demonstrated increased emergent awareness and self-regulation with confrontational naming task, naming common objects with 100% accuracy with extra time and Mod I. 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 SLP provided education regarding word-finding strategies, and pt attempted to utilize them with Max cues. Continue plan of care.   FIM:  Comprehension Comprehension Mode: Auditory Comprehension: 4-Understands basic 75 - 89% of the time/requires cueing 10 - 24% of the time Expression Expression Mode: Verbal Expression: 4-Expresses basic 75 - 89% of the time/requires cueing 10 - 24% of the time. Needs  helper to occlude trach/needs to repeat words. Social Interaction Social Interaction: 5-Interacts appropriately 90% of the time - Needs monitoring or encouragement for participation or interaction. Problem Solving Problem Solving: 3-Solves basic 50 - 74% of the time/requires cueing 25 - 49% of the time Memory Memory: 3-Recognizes or recalls 50 - 74% of the time/requires cueing 25 - 49% of the time FIM - Eating Eating Activity: 5: Set-up assist for open containers  Pain Pain Assessment Pain Assessment: No/denies pain Pain Score: 4  Pain Type: Acute pain Pain Location: Arm Pain Orientation: Right Pain Descriptors / Indicators: Sore Pain Frequency: Intermittent Pain Onset: Gradual Patients Stated Pain Goal: 2 Pain Intervention(s): Medication (See eMAR)  Therapy/Group: Individual Therapy   Maxcine Ham, M.A. CCC-SLP [phone]   Maxcine Ham 12/02/2013, 11:52 AM

## 2013-12-03 ENCOUNTER — Inpatient Hospital Stay (HOSPITAL_COMMUNITY): Payer: BC Managed Care – PPO | Admitting: Occupational Therapy

## 2013-12-03 ENCOUNTER — Inpatient Hospital Stay (HOSPITAL_COMMUNITY): Payer: BC Managed Care – PPO

## 2013-12-03 ENCOUNTER — Ambulatory Visit (HOSPITAL_COMMUNITY): Payer: BC Managed Care – PPO | Admitting: *Deleted

## 2013-12-03 NOTE — Progress Notes (Signed)
Subjective/Complaints: Concerned about some family issues, also discussed etiology of CVA Aphasic with word substitution A 12 point review of systems has been performed and if not noted above is otherwise negative.   Objective: Vital Signs: Blood pressure 120/74, pulse 90, temperature 98 F (36.7 C), temperature source Oral, resp. rate 19, height 5\' 3"  (1.6 m), weight 97.705 kg (215 lb 6.4 oz), SpO2 99.00%. No results found. No results found for this basename: WBC, HGB, HCT, PLT,  in the last 72 hours  Recent Labs  12/02/13 0535  CREATININE 0.73   CBG (last 3)  No results found for this basename: GLUCAP,  in the last 72 hours  Wt Readings from Last 3 Encounters:  12/03/13 97.705 kg (215 lb 6.4 oz)  11/20/13 96.6 kg (212 lb 15.4 oz)  11/14/13 99.338 kg (219 lb)    Physical Exam:  Vitals reviewed.  General: pleasant and NAD. A little fatigued appearing  HENT: oral mucosa pink and moist. Dentition is fair  Head: Normocephalic.  Eyes: EOM are normal.  Neck: Normal range of motion. Neck supple. No thyromegaly present.  Cardiovascular: Normal rate and regular rhythm. No murmurs, rubs, gallops  Respiratory: Effort normal and breath sounds normal. No respiratory distress. No wheezes  GI: Soft. Bowel sounds are normal. She exhibits no distension. Non-distended Neurological: She is alert. Follows direction   RUE grossly 4/5. RLE also 4 to 4+/5. Improving volitional control and coordination. Has LT sensation on the right. No evidence of field cut with confrontation testing.   Has difficulties with expressive language at times with apraxia, uses paraphrases at times- No gross cranial nerve deficits.  Skin: Skin is warm and dry. No perineal rash Psychiatric:  not anxious. Very pleasant.   Assessment/Plan: 1. Functional deficits secondary to Thrombotic left MCA infarct which require 3+ hours per day of interdisciplinary therapy in a comprehensive inpatient rehab  setting. Physiatrist is providing close team supervision and 24 hour management of active medical problems listed below. Physiatrist and rehab team continue to assess barriers to discharge/monitor patient progress toward functional and medical goals.  Team conference today please see physician documentation under team conference tab, met with team face-to-face to discuss problems,progress, and goals. Formulized individual treatment plan based on medical history, underlying problem and comorbidities.  FIM: FIM - Bathing Bathing Steps Patient Completed: Chest;Right Arm;Left upper leg;Right upper leg;Left Arm;Right lower leg (including foot);Abdomen;Left lower leg (including foot);Front perineal area;Buttocks Bathing: 5: Supervision: Safety issues/verbal cues  FIM - Upper Body Dressing/Undressing Upper body dressing/undressing steps patient completed: Thread/unthread right bra strap;Thread/unthread left bra strap;Thread/unthread right sleeve of pullover shirt/dresss;Put head through opening of pull over shirt/dress;Thread/unthread left sleeve of pullover shirt/dress;Pull shirt over trunk;Hook/unhook bra Upper body dressing/undressing: 5: Supervision: Safety issues/verbal cues FIM - Lower Body Dressing/Undressing Lower body dressing/undressing steps patient completed: Thread/unthread right underwear leg;Thread/unthread left underwear leg;Pull underwear up/down;Thread/unthread right pants leg;Thread/unthread left pants leg;Pull pants up/down;Fasten/unfasten pants;Don/Doff left shoe;Fasten/unfasten right shoe;Fasten/unfasten left shoe;Don/Doff right sock;Don/Doff left sock;Don/Doff right shoe Lower body dressing/undressing: 5: Supervision: Safety issues/verbal cues  FIM - Toileting Toileting steps completed by patient: Adjust clothing prior to toileting;Performs perineal hygiene;Adjust clothing after toileting Toileting Assistive Devices: Grab bar or rail for support Toileting: 5: Supervision: Safety  issues/verbal cues  FIM - Diplomatic Services operational officer Devices: Grab bars Toilet Transfers: 5-To toilet/BSC: Supervision (verbal cues/safety issues);5-From toilet/BSC: Supervision (verbal cues/safety issues)  FIM - Press photographer Assistive Devices: Arm rests Bed/Chair Transfer: 5: Bed > Chair or W/C: Supervision (verbal cues/safety  issues);5: Chair or W/C > Bed: Supervision (verbal cues/safety issues)  FIM - Locomotion: Wheelchair Locomotion: Wheelchair: 0: Activity did not occur FIM - Locomotion: Ambulation Locomotion: Ambulation Assistive Devices: Other (comment) (none) Ambulation/Gait Assistance: 4: Min guard Locomotion: Ambulation: 4: Travels 150 ft or more with minimal assistance (Pt.>75%)  Comprehension Comprehension Mode: Auditory Comprehension: 4-Understands basic 75 - 89% of the time/requires cueing 10 - 24% of the time  Expression Expression Mode: Verbal Expression: 4-Expresses basic 75 - 89% of the time/requires cueing 10 - 24% of the time. Needs helper to occlude trach/needs to repeat words.  Social Interaction Social Interaction: 5-Interacts appropriately 90% of the time - Needs monitoring or encouragement for participation or interaction.  Problem Solving Problem Solving: 3-Solves basic 50 - 74% of the time/requires cueing 25 - 49% of the time  Memory Memory: 3-Recognizes or recalls 50 - 74% of the time/requires cueing 25 - 49% of the time  Medical Problem List and Plan:  1. Embolic left MCA infarct  2. DVT Prophylaxis/Anticoagulation: Subcutaneous Lovenox.   3. Pain Management: Tylenol as needed.  4. Neuropsych: This patient is not capable of making decisions on her own behalf.  5. Dysphagia/aphasia. Followup speech therapy. Currently maintained on mechanical soft diet and eating well 6. Hypertension. Aldactone 12.5 mg daily. Monitor with increased activity  7. Acute sinusitis. Augmentin has been completed  8.  Hyperlipidemia. Lipitor  9. GERD. Protonix  10. Asthma. Continue MDI's. No problems at present. Chest clear 11.  Urinary frequency-may be CVA related but will recheck UA, Last UA 12/30 neg LOS (Days) 8 A FACE TO FACE EVALUATION WAS PERFORMED  Claudette Laws E 12/03/2013 7:11 AM

## 2013-12-03 NOTE — Progress Notes (Signed)
Social Work Patient ID: Andrea Mason, female   DOB: Dec 17, 1950, 62 y.o.   MRN: [id number]  CSW met with pt to update her on team conference discussion.  Told pt that the team feels she is doing well and that her d/c date was changed to 12-10-13.  She was pleased.  She feels family could get her to outpt appts if they know when they are in advance.  CSW also spoke with pt's mother and dtr-in-law at pt's request.  They were both pleased that she is doing well and that the d/c date was made sooner.  Dtr-in-law feels they can get her to appts and has family members to stop in to check on pt at pt's mother's home.  CSW explained that pt will not need hands on physical care, just 24/7 supervision.  She expressed understanding.  Pt's mother was pleased with d/c date, as well.  Explained to her that CSW will arrange any necessary DME, as well, prior to pt's d/c.

## 2013-12-03 NOTE — Progress Notes (Signed)
Speech Language Pathology Weekly Progress & Daily Session Notes  Patient Details  Name: Andrea Mason MRN: [id number] Date of Birth: Sep 02, 1951  Today's Date: 12/03/2013 Time: 4098-1191 Time Calculation (min): 45 min  Short Term Goals: Week 1: SLP Short Term Goal 1 (Week 1): Pt will demonstrate effective mastication and oral clearance with regular textures with Min cues. SLP Short Term Goal 1 - Progress (Week 1): Not met SLP Short Term Goal 2 (Week 1): Pt will name common objects with 80% accuracy with Mod cues SLP Short Term Goal 2 - Progress (Week 1): Met SLP Short Term Goal 3 (Week 1): Pt will follow one-step command during functional task with Mod cues SLP Short Term Goal 3 - Progress (Week 1): Met SLP Short Term Goal 4 (Week 1): Pt will demonstrate intellectual awareness of impairments with Max cues SLP Short Term Goal 4 - Progress (Week 1): Met SLP Short Term Goal 5 (Week 1): Pt will sustain attention to functional task for 10 min with Mod cues SLP Short Term Goal 5 - Progress (Week 1): Met  New Short Term Goals: Week 2: SLP Short Term Goal 1 (Week 2): Pt will demonstrate effective mastication and oral clearance with regular textures with Min cues. SLP Short Term Goal 2 (Week 2): Pt will follow two-step commands during functional task with Min cues SLP Short Term Goal 3 (Week 2): Pt will demonstrate emergent awareness of impairments with Min cues SLP Short Term Goal 4 (Week 2): Pt will sustain attention to functional task for 30 min with Min cues SLP Short Term Goal 5 (Week 2): Pt will utilize word-finiding strategies during structured linguistic task with Mod cues  Weekly Progress Updates: Pt has met 4 out of 5 STGs during this reporting period. She is consuming Dys 3 textures and thin liquids with intermittent supervision. Her confrontational naming and auditory comprehension have improved. She can now label common objects with extra time for self-monitoring and -correction, and  follows two-step commands with Min-Mod cues. She requires Mod cues to sustain attention to task for ~30 minutes. She is now demonstrating increased intellectual and emergent awareness. Education is ongoing with patient. Pt will benefit from continued SLP services to maximize communication, swallowing safety, and functional independence prior to discharge.   SLP Intensity: Minumum of 1-2 x/day, 30 to 90 minutes SLP Frequency: 5 out of 7 days SLP Duration/Estimated Length of Stay: 18-21 days SLP Treatment/Interventions: Cognitive remediation/compensation;Cueing hierarchy;Dysphagia/aspiration precaution training;Environmental controls;Functional tasks;Internal/external aids;Patient/family education;Speech/Language facilitation  Daily Session Skilled Therapeutic Intervention: Treatment focused on cognitive-linguistic goals. SLP facilitated session with creation of external aid for recall of current medications, as patient had many questions this morning regarding her regimen. Pt asked questions with Min-Mod A for verbal expression, using word-finding strategy spontaneously x1. SLP reviewed word-finding strategies and pt then utilized them during structured linguistic task with Mod cues. Pt spontaneously demonstrated anticipatory awareness by stating that she thinks she needs to live with her mother for a while until she can get better. She continues to exhibit paraphasias, which she is able to self-monitor with Min cues and self-correct with Min-Mod cues. Pt demonstrated basic verbal problem solving with Mod cues and required Mod cues for redirection to task throughout 45 minute session.  FIM:  Comprehension Comprehension Mode: Auditory Comprehension: 4-Understands basic 75 - 89% of the time/requires cueing 10 - 24% of the time Expression Expression Mode: Verbal Expression: 3-Expresses basic 50 - 74% of the time/requires cueing 25 - 50% of the time. Needs to repeat  parts of sentences. Social  Interaction Social Interaction: 4-Interacts appropriately 75 - 89% of the time - Needs redirection for appropriate language or to initiate interaction. Problem Solving Problem Solving: 3-Solves basic 50 - 74% of the time/requires cueing 25 - 49% of the time Memory Memory: 3-Recognizes or recalls 50 - 74% of the time/requires cueing 25 - 49% of the time FIM - Eating Eating Activity: 6: Swallowing techniques: self-managed General    Pain Pain Assessment Pain Assessment: No/denies pain  Therapy/Group: Individual Therapy   Andrea Mason, M.A. CCC-SLP [phone]   Andrea Mason 12/03/2013, 9:46 AM

## 2013-12-03 NOTE — Progress Notes (Signed)
Recreational Therapy Session Note  Patient Details  Name: Andrea Mason MRN: [id number] Date of Birth: [birth year] Today's Date: 12/03/2013  Pain: no c/o Skilled Therapeutic Interventions/Progress Updates: Session focused on activity tolerance, high level dynamic balance, coordination, self expression during Wii game, "Just Dance" on tile & mat surfaces with close supervision-contact guard assist, no LOB.  Pt's HR up to 135 bpm with activity but recovered to >120 in less than 1 minutes with seated rest break.     Pt ambulated without AD to/from room with close supervision. Upon returning to room x 123', fatigued, pt benefited from L HHA to improved cadence and UE swing.   Therapy/Group: Co-Treatment Burnice Vassel 12/03/2013, 2:58 PM

## 2013-12-03 NOTE — Patient Care Conference (Signed)
Inpatient RehabilitationTeam Conference and Plan of Care Update Date: 12/03/2013   Time: 10:30 AM   Patient Name: Andrea Mason      Medical Record Number: [id number]  Date of Birth: October 07, 1951 Sex: Female         Room/Bed: 4M03C/4M03C-01 Payor Info: Payor: BLUE CROSS BLUE [employer]IELD / Plan: BCBS [place] PPO / Product Type: *No Product type* /    Admitting Diagnosis: cva  Admit Date/Time:  11/25/2013  4:40 PM Admission Comments: No comment available   Primary Diagnosis:  <principal problem not specified> Principal Problem: <principal problem not specified>  Patient Active Problem List   Diagnosis Date Noted  . CVA (cerebral infarction) 11/25/2013  . Other and unspecified hyperlipidemia 11/24/2013  . Stroke 11/21/2013  . Syncope 11/20/2013  . Cough 11/16/2013  . 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 Back pain 11/06/2013  . Hypokalemia 05/06/2013  . Family history of colon cancer 05/06/2013  . Lipoma 09/03/2012  . Hyperglycemia 08/15/2012  . Constipation 01/02/2012  . Preventative health care 05/24/2011  . 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 HYPERSOMNIA WITH SLEEP APNEA UNSPECIFIED 01/26/2011  . NASAL POLYP 07/28/2010  . FIBROIDS, UTERUS 07/27/2010  . COMPUTERIZED TOMOGRAPHY, CHEST, ABNORMAL 09/16/2009  . Seasonal and perennial allergic rhinitis 05/29/2009  . PERIPHERAL EDEMA 05/28/2009  . Intrinsic asthma, unspecified 11/23/2008  . LEG PAIN, LEFT 06/30/2008  . COLONIC POLYPS, HX OF 06/30/2008  . OBESITY 12/12/2007  . DEPRESSION 12/12/2007  . HYPERTENSION 12/12/2007  . RHINOSINUSITIS, CHRONIC 12/12/2007  . GERD 12/12/2007  . ARTHRITIS 12/12/2007    Expected Discharge Date: Expected Discharge Date: 12/10/13  Team Members Present: Physician leading conference: Dr. Claudette Laws Social Worker Present: Dossie Der, LCSW;Jenny Oanh Devivo, LCSW Nurse Present: Other (comment) Morrie Sheldon Royal, RN) PT Present: Wanda Plump, PT OT Present: Scherrie November, OT SLP  Present: Maxcine Ham, SLP PPS Coordinator present : Edson Snowball, Chapman Fitch, RN, CRRN     Current Status/Progress Goal Weekly Team Focus  Medical   urinary freq but not inc, fxn improving more quickly than anticipated  maintain stability for D/C  cont current meds, adjust if needed   Bowel/Bladder   Patient continent of bowel/bladder; LBM 12/01/2013, prn laxatives available for constipation.  remain continent of bowel/bladder  monitor   Swallow/Nutrition/ Hydration   Dys 3 textures and thin liquids with intermittent supervision  Mod I with least restrictive PO  trials of advanced textures   ADL's   supervision overall with max-mod cues secondary to cognitive linquistic deficits  supervision with min cueing  ADL retraining, RUE coordination, education, cogntive deficits, standing balance   Mobility   S basic transfers, S/min A gait x150', min A stairs x12, min A dynamic balance  supervision overall for gait x 150' , 5 steps with 2 rails, basic and car transfers  Dynamic gait and balance, safety awareness, RLE NMR esp proprioception for ankle control, family ed   Communication   Mod cues for word-finding strategies during structured task  Min  expressive/receptive language tasks, two-step commands, word-finding strategies, awareness of errors   Safety/Cognition/ Behavioral Observations  increased intellectual and emergent awareness, Mod cues basic problem solving and sustained attention  Min  emergent awareness, sustained attention, basic problem solving   Pain   n/a         Skin   n/a- skin intact          Rehab Goals Patient on target to meet rehab goals: Yes Rehab Goals Revised: None *See Care Plan and progress notes for long  and short-term goals.  Barriers to Discharge: needing sup/min    Possible Resolutions to Barriers:  upgrade     Discharge Planning/Teaching Needs:  Pt will be going to her mother's home, who will be with her 24/7.  Pt will not need any hands  on physical assistance.  Other family members will be available for support, as well.  Family can participate in family education, if needed.   Team Discussion:  Pt continues to be aphasic, but has made some progress.  She also has some urinary frequency.  Otherwise, doing well medically and with therapies.  Team feels pt could d/c sooner, so d/c date changed to 12-10-13.  Therapists recommending outpatient therapies if family feels they can get her there.  Revisions to Treatment Plan:  Discharge date moved from 12-16-13 to 12-10-13.   Continued Need for Acute Rehabilitation Level of Care: The patient requires daily medical management by a physician with specialized training in physical medicine and rehabilitation for the following conditions: Daily direction of a multidisciplinary physical rehabilitation program to ensure safe treatment while eliciting the highest outcome that is of practical value to the patient.: Yes Daily medical management of patient stability for increased activity during participation in an intensive rehabilitation regime.: Yes Daily analysis of laboratory values and/or radiology reports with any subsequent need for medication adjustment of medical intervention for : Neurological problems  Jennett Tarbell, [place] 12/03/2013, 2:51 PM

## 2013-12-03 NOTE — Progress Notes (Addendum)
Physical Therapy Note  Patient Details  Name: Solash Tullo MRN: [id number] Date of Birth: [birth year] Today's Date: 12/03/2013  1100-1200 60 min co-treat with Therapeutic Rec for skilled high level balance activities.  1400-1430 30 min individual therapy  Tx 1: no pain reported  Gait to/from room x 123' without AD with close supervision.  Upon returning to room x 123', fatigued, pt benefited from L HHA to improve cadence and UE swing.  Floor transfer x 2 with extra time for problem solving. Pt was safe with supervision.  High level balance/coordination on level tile, and on floor mat during Wii dancing, supervision/min guard assist, without LOB.  HR 135 after 3-5 minutes each song. Recovered HR to 120 or less in 1 minute.  neuromuscular re-education via demo, tactile cues for ball toss x 10 x 2 in tall kneeling and 1/2 kneeling challenging L and R hip.   Tx 2:  No pain reported  Gait without AD with and without HHA as above x 150'.  Arm swing improves with rhythmic cues for stepping.  neuromuscular re-education via forced use, tactile and VCS for gait without AD, flight of stairs, wt shifting without UE use on Kinetron, bil hand use for fine motor activity in sitting  Up/down 12 steps bil rails, min guard assist except for min/mod assist initially when ascending, due to pt attempting step through technique, and R knee buckled.  Kinetron in standing at 20 cm/sec with manual cues for full L and R wt shifting.  Pt reluctant to fully shift to LLE, choosing to shift shoulders rather than hips when attempting this.  Step/ taps of RLE onto 2" high stool forcing wt shifting to LLE, x 10 x 2 with improving performance by end of trials.  Khamari Yousuf 12/03/2013, 11:45 AM

## 2013-12-03 NOTE — Progress Notes (Signed)
Occupational Therapy Session Note  Patient Details  Name: Andrea Mason MRN: [id number] Date of Birth: 1951/05/08  Today's Date: 12/03/2013 Time: 1000-1100 Time Calculation (min): 60 min  Short Term Goals: Week 1:  OT Short Term Goal 1 (Week 1): Pt will demonstrate improved praxis by donning pants over feet without assist. OT Short Term Goal 2 (Week 1): Pt will demonstrate improved standing balance by pulling pants over hips with steadying assist. OT Short Term Goal 3 (Week 1): Pt will be able to transfer to the toilet with steadying assist with a stand pivot transfer. OT Short Term Goal 4 (Week 1): Pt will demonstrate improved RUE coordination so that she can fasten her bra in front with min assist. OT Short Term Goal 5 (Week 1): Pt will demonstrate improved sitting balance to bathe her feet with close supervision from a seated position.  Skilled Therapeutic Interventions/Progress Updates:    Pt seen for ADL retraining of bathing at shower level and dressing with a focus on safety awareness, R foot placement and awareness of R foot, attention to task, processing, slowing down.  Pt ambulated around the room to gather her supplies to prepare for shower, but was upset about a family member accessing her phone. Assisted pt to set up a lock on her phone with a pin she could recall. As pt was gathering supplies, needed cues to slow down and limit talking while she was trying to concentrate.  She did get distracted numerous times, but was able to self correct 75% of the time.  Pt needed several cues when bathing and dressing to fully place R foot on the floor prior to crossing her leg to prevent slipping. She stated she has poor sensation but will try to "use her eyes". Overall, supervision with self care. Pt was even able to don Ted hose and tie shoes.  Pt's PT had arrived for her next session.  Therapy Documentation Precautions:  Precautions Precautions: Fall Restrictions Weight Bearing  Restrictions: No    Vital Signs: Oxygen Therapy SpO2: 99 % O2 Device: None (Room air) Pain: Pain Assessment Pain Assessment: No/denies pain ADL:  See FIM for current functional status  Therapy/Group: Individual Therapy  Alina Gilkey 12/03/2013, 12:01 PM

## 2013-12-04 LAB — CBC
HCT: 38.2 % (ref 36.0–46.0)
HEMOGLOBIN: 12.9 g/dL (ref 12.0–15.0)
MCH: 30 pg (ref 26.0–34.0)
MCHC: 33.8 g/dL (ref 30.0–36.0)
MCV: 88.8 fL (ref 78.0–100.0)
PLATELETS: 244 10*3/uL (ref 150–400)
RBC: 4.3 MIL/uL (ref 3.87–5.11)
RDW: 13 % (ref 11.5–15.5)
WBC: 6.7 10*3/uL (ref 4.0–10.5)

## 2013-12-04 NOTE — Progress Notes (Signed)
Subjective/Complaints: States she's had blood on tissue after wiping with toileting.  No dysuria, no pain with BM, pt unable to state whether this is after urination or BM.  Not sure whether she's had hemorrhoids in past Aphasic with word substitution A 12 point review of systems has been performed and if not noted above is otherwise negative.   Objective: Vital Signs: Blood pressure 124/76, pulse 78, temperature 97.9 F (36.6 C), temperature source Oral, resp. rate 18, height 5\' 3"  (1.6 m), weight 97.705 kg (215 lb 6.4 oz), SpO2 96.00%. No results found. No results found for this basename: WBC, HGB, HCT, PLT,  in the last 72 hours  Recent Labs  12/02/13 0535  CREATININE 0.73   CBG (last 3)  No results found for this basename: GLUCAP,  in the last 72 hours  Wt Readings from Last 3 Encounters:  12/03/13 97.705 kg (215 lb 6.4 oz)  11/20/13 96.6 kg (212 lb 15.4 oz)  11/14/13 99.338 kg (219 lb)    Physical Exam:  Vitals reviewed.  General: pleasant and NAD. A little fatigued appearing  HENT: oral mucosa pink and moist. Dentition is fair  Head: Normocephalic.  Eyes: EOM are normal.  Neck: Normal range of motion. Neck supple. No thyromegaly present.  Cardiovascular: Normal rate and regular rhythm. No murmurs, rubs, gallops  Respiratory: Effort normal and breath sounds normal. No respiratory distress. No wheezes  GI: Soft. Bowel sounds are normal. She exhibits no distension. Non-distended Neurological: She is alert. Follows direction   RUE  4+/5. RLE also  4+/5. Improving volitional control and coordination. Has LT sensation on the right. No evidence of field cut with confrontation testing.   Has difficulties with expressive language at times with apraxia, uses paraphrases at times-  Skin: Skin is warm and dry. No perineal rash Psychiatric:  not anxious. Very pleasant.   Assessment/Plan: 1. Functional deficits secondary to Thrombotic left MCA infarct which require 3+ hours  per day of interdisciplinary therapy in a comprehensive inpatient rehab setting. Physiatrist is providing close team supervision and 24 hour management of active medical problems listed below. Physiatrist and rehab team continue to assess barriers to discharge/monitor patient progress toward functional and medical goals.   FIM: FIM - Bathing Bathing Steps Patient Completed: Chest;Right Arm;Left upper leg;Right upper leg;Left Arm;Right lower leg (including foot);Abdomen;Left lower leg (including foot);Front perineal area;Buttocks Bathing: 5: Supervision: Safety issues/verbal cues  FIM - Upper Body Dressing/Undressing Upper body dressing/undressing steps patient completed: Thread/unthread right bra strap;Thread/unthread left bra strap;Thread/unthread right sleeve of pullover shirt/dresss;Put head through opening of pull over shirt/dress;Thread/unthread left sleeve of pullover shirt/dress;Pull shirt over trunk;Hook/unhook bra Upper body dressing/undressing: 5: Supervision: Safety issues/verbal cues FIM - Lower Body Dressing/Undressing Lower body dressing/undressing steps patient completed: Thread/unthread right underwear leg;Thread/unthread left underwear leg;Pull underwear up/down;Thread/unthread right pants leg;Thread/unthread left pants leg;Pull pants up/down;Fasten/unfasten pants;Don/Doff left shoe;Fasten/unfasten right shoe;Fasten/unfasten left shoe;Don/Doff right sock;Don/Doff left sock;Don/Doff right shoe Lower body dressing/undressing: 5: Supervision: Safety issues/verbal cues  FIM - Toileting Toileting steps completed by patient: Adjust clothing prior to toileting;Performs perineal hygiene;Adjust clothing after toileting Toileting Assistive Devices: Grab bar or rail for support Toileting: 5: Supervision: Safety issues/verbal cues  FIM - Radio producer Devices: Grab bars Toilet Transfers: 5-To toilet/BSC: Supervision (verbal cues/safety issues);5-From  toilet/BSC: Supervision (verbal cues/safety issues)  FIM - Engineer, site Assistive Devices: Arm rests Bed/Chair Transfer: 5: Bed > Chair or W/C: Supervision (verbal cues/safety issues);5: Chair or W/C > Bed: Supervision (verbal cues/safety issues)  FIM - Locomotion: Wheelchair Locomotion: Wheelchair: 0: Activity did not occur FIM - Locomotion: Ambulation Locomotion: Ambulation Assistive Devices: Other (comment) (none) Ambulation/Gait Assistance: 4: Min assist;5: Supervision Locomotion: Ambulation: 5: Travels 150 ft or more with supervision/safety issues  Comprehension Comprehension Mode: Auditory Comprehension: 4-Understands basic 75 - 89% of the time/requires cueing 10 - 24% of the time  Expression Expression Mode: Verbal Expression: 3-Expresses basic 50 - 74% of the time/requires cueing 25 - 50% of the time. Needs to repeat parts of sentences.  Social Interaction Social Interaction: 4-Interacts appropriately 75 - 89% of the time - Needs redirection for appropriate language or to initiate interaction.  Problem Solving Problem Solving: 4-Solves basic 75 - 89% of the time/requires cueing 10 - 24% of the time  Memory Memory: 4-Recognizes or recalls 75 - 89% of the time/requires cueing 10 - 24% of the time  Medical Problem List and Plan:  1. Embolic left MCA infarct  2. DVT Prophylaxis/Anticoagulation: Subcutaneous Lovenox.   3. Pain Management: Tylenol as needed.  4. Neuropsych: This patient is not capable of making decisions on her own behalf.  5. Dysphagia/aphasia. Followup speech therapy. Currently maintained on mechanical soft diet and eating well 6. Hypertension. Aldactone 12.5 mg daily. Monitor with increased activity  7. Acute sinusitis. Augmentin has been completed  8. Hyperlipidemia. Lipitor  9. GERD. Protonix  10. Asthma. Continue MDI's. No problems at present. Chest clear 11.  Urinary frequency- CVA related  12.  ?hematochezia, will ask RN to  monitor check CBC and guaic stool, recent UA - LOS (Days) 9 A FACE TO FACE EVALUATION WAS PERFORMED  Alysia Penna E 12/04/2013 10:53 AM

## 2013-12-05 ENCOUNTER — Inpatient Hospital Stay (HOSPITAL_COMMUNITY): Payer: BC Managed Care – PPO | Admitting: Occupational Therapy

## 2013-12-05 ENCOUNTER — Inpatient Hospital Stay (HOSPITAL_COMMUNITY): Payer: BC Managed Care – PPO | Admitting: *Deleted

## 2013-12-05 ENCOUNTER — Inpatient Hospital Stay (HOSPITAL_COMMUNITY): Payer: BC Managed Care – PPO

## 2013-12-05 NOTE — Progress Notes (Signed)
Speech Language Pathology Daily Session Note  Patient Details  Name: Andrea Mason MRN: [id number] Date of Birth: [birth year]  Today's Date: 12/05/2013 Time: 2993-7169 Time Calculation (min): 45 min  Short Term Goals: Week 2: SLP Short Term Goal 1 (Week 2): Pt will demonstrate effective mastication and oral clearance with regular textures with Min cues. SLP Short Term Goal 2 (Week 2): Pt will follow two-step commands during functional task with Min cues SLP Short Term Goal 3 (Week 2): Pt will demonstrate emergent awareness of impairments with Min cues SLP Short Term Goal 4 (Week 2): Pt will sustain attention to functional task for 30 min with Min cues SLP Short Term Goal 5 (Week 2): Pt will utilize word-finiding strategies during structured linguistic task with Mod cues  Skilled Therapeutic Interventions: Treatment focused on cognitive-linguistic goals. SLP facilitated session with structured linguistic task. Pt sustained attention to task for ~30 min with Min redirective cues. She completed categorical word-generation task with Mod-Max cues to elicit 10 words in a category in ~5 minutes. Pt required Mod cues to utilize word-finding strategies. Pt required Max cues to write the name of a common object and Min-Mod cues to write her full name, both without written models. Pt demonstrates adequate emergent awareness of linguistic errors with supervision level question cues, however requires Mod-Max cues to correct. Continue plan of care.    FIM:  Comprehension Comprehension Mode: Auditory Comprehension: 4-Understands basic 75 - 89% of the time/requires cueing 10 - 24% of the time Expression Expression Mode: Verbal Expression: 3-Expresses basic 50 - 74% of the time/requires cueing 25 - 50% of the time. Needs to repeat parts of sentences. Social Interaction Social Interaction: 5-Interacts appropriately 90% of the time - Needs monitoring or encouragement for participation or interaction. Problem  Solving Problem Solving: 3-Solves basic 50 - 74% of the time/requires cueing 25 - 49% of the time Memory Memory: 3-Recognizes or recalls 50 - 74% of the time/requires cueing 25 - 49% of the time  Pain Pain Assessment Pain Assessment: No/denies pain  Therapy/Group: Individual Therapy   Germain Osgood, M.A. CCC-SLP [phone]   Germain Osgood 12/05/2013, 11:43 AM

## 2013-12-05 NOTE — Plan of Care (Signed)
Problem: RH BOWEL ELIMINATION Goal: RH STG MANAGE BOWEL WITH ASSISTANCE STG Manage Bowel with Modified independent Assistance.  Outcome: Not Progressing LBM 12-01-13 Goal: RH STG MANAGE BOWEL W/MEDICATION W/ASSISTANCE STG Manage Bowel with Medication with [place].  Outcome: Not Progressing LBM 12-01-13

## 2013-12-05 NOTE — Progress Notes (Signed)
Physical Therapy Note  Patient Details  Name: Andrea Mason MRN: [id number] Date of Birth: 1951-09-23 Today's Date: 12/05/2013  Time 1: 730-800 30 minutes  1:1 No c/o pain.  Treatment focused on balance training. Tap ups to cone with min steadying assist, cues for reciprocal taps.  Picking up objects off floor with close supervision.  Ball kick with close supervision- min A, cues for safety.  Pt with improving balance reactions, able to dynamically move during ball toss and self correct small losses of balance.  Gait throughout unit without device with supervision.   Time 2: 1100-1130 30 minutes  1:1 No c/o pain.  Dynamic gait training with start/stop and speed changes.  Pt needs occasional min A due to delayed balance reactions.  Discussed importance of increased awareness and taking her time in community situations where dynamic gait and balance will be challenged.  Tandem walking, [place], side and backwards gait and heel and toe walking for LE strengthening and balance with min A.  Berwyn Bigley 12/05/2013, 7:58 AM

## 2013-12-05 NOTE — Progress Notes (Signed)
Occupational Therapy Weekly Progress Note  Patient Details  Name: Andrea Mason MRN: [id number] Date of Birth: 1951-01-23  Today's Date: 12/05/2013 Time:1000-1100  Time Calculation (min): 60 min  Patient has met 5 of 5 short term goals.  Pt has been making excellent progress with her awareness, coordination, and balance.  She is now at a close supervision level. She still has deficits in these areas, but has improved since her initial evaluation.  Patient continues to demonstrate the following deficits: limited RUE/RLE sensation and coordination, decreased balance, decreased safety awareness with fast movements and therefore will continue to benefit from skilled OT intervention to enhance overall performance with BADL.  Patient progressing toward long term goals..  Continue plan of care.  OT Short Term Goals Week 1:  OT Short Term Goal 1 (Week 1): Pt will demonstrate improved praxis by donning pants over feet without assist. OT Short Term Goal 1 - Progress (Week 1): Met OT Short Term Goal 2 (Week 1): Pt will demonstrate improved standing balance by pulling pants over hips with steadying assist. OT Short Term Goal 2 - Progress (Week 1): Met OT Short Term Goal 3 (Week 1): Pt will be able to transfer to the toilet with steadying assist with a stand pivot transfer. OT Short Term Goal 3 - Progress (Week 1): Met OT Short Term Goal 4 (Week 1): Pt will demonstrate improved RUE coordination so that she can fasten her bra in front with min assist. OT Short Term Goal 4 - Progress (Week 1): Met OT Short Term Goal 5 (Week 1): Pt will demonstrate improved sitting balance to bathe her feet with close supervision from a seated position. OT Short Term Goal 5 - Progress (Week 1): Met Week 2:  OT Short Term Goal 1 (Week 2): STGs = LTGs   Skilled Therapeutic Interventions/Progress Updates:      Pt seen for BADL retraining of toileting, bathing at shower level, and dressing with a focus on safety awareness  with fast turns and movements and RLE placement, balance in the shower in standing, Rue coordination with fasteners.  Pt was able to ambulate around her room without AE to gather her clothing out of her drawers with close supervision and complete her self care with supervision.  She did need occasional cues in the shower to move safely as she was managing the [facility] shower.  She can cross her legs to bathe her feet, but would be safer with a long handled sponge.  Pt stood for much of the session.  Discussed what she needs to continue to work on prior to discharge. She will need to practice tub bench transfers in the tub.  Pt's PT had arrived for her next session.  Therapy Documentation Precautions:  Precautions Precautions: Fall Restrictions Weight Bearing Restrictions: No   Pain: Pain Assessment Pain Assessment: No/denies pain ADL:  See FIM for current functional status  Therapy/Group: Individual Therapy  Rubby Barbary 12/05/2013, 11:58 AM

## 2013-12-05 NOTE — Progress Notes (Signed)
Occupational Therapy Session Note  Patient Details  Name: Andrea Mason MRN: [id number] Date of Birth: 12-24-1950  Today's Date: 12/05/2013 Time: 7619-5093 Time Calculation (min): 30 min  Short Term Goals: Week 2:  OT Short Term Goal 1 (Week 2): STGs = LTGs   Skilled Therapeutic Interventions: Therapeutic exercises with emphasis on BU/LE strengthening using NuStep, functional mobility, and dynamic standing balance.  Patient ambulated from room to gym with supervision to perform seated therex using NuStep.   Patient requires use of foot strap and verbal cues to normalize movement of RLE during task.   Patient completed 2 games of "GoBan" while standing unsupported and required verbal cues to attend to right lower portion of game board.   No evidence of LOB noted during game play, transfers, or functional mobiltiy.    Therapy Documentation Precautions:  Precautions Precautions: Fall Restrictions Weight Bearing Restrictions: No  Pain: No report of pain  Exercises: Cardiovascular Exercises NuStep: 10 min, level 5  See FIM for current functional status  Therapy/Group: Individual Therapy  Andrea Mason 12/05/2013, 3:37 PM

## 2013-12-05 NOTE — Progress Notes (Addendum)
Subjective/Complaints: Doing well with PT, able to kick large ball Aphasic with word substitution A 12 point review of systems has been performed and if not noted above is otherwise negative.   Objective: Vital Signs: Blood pressure 114/71, pulse 98, temperature 98 F (36.7 C), temperature source Oral, resp. rate 18, height 5\' 3"  (1.6 m), weight 97.705 kg (215 lb 6.4 oz), SpO2 98.00%. No results found.  Recent Labs  12/04/13 1144  WBC 6.7  HGB 12.9  HCT 38.2  PLT 244   No results found for this basename: NA, K, CL, CO, GLUCOSE, BUN, CREATININE, CALCIUM,  in the last 72 hours CBG (last 3)  No results found for this basename: GLUCAP,  in the last 72 hours  Wt Readings from Last 3 Encounters:  12/03/13 97.705 kg (215 lb 6.4 oz)  11/20/13 96.6 kg (212 lb 15.4 oz)  11/14/13 99.338 kg (219 lb)    Physical Exam:  Vitals reviewed.  General: pleasant and NAD. A little fatigued appearing  HENT: oral mucosa pink and moist. Dentition is fair  Head: Normocephalic.  Eyes: EOM are normal.  Neck: Normal range of motion. Neck supple. No thyromegaly present.  Cardiovascular: Normal rate and regular rhythm. No murmurs, rubs, gallops  Respiratory: Effort normal and breath sounds normal. No respiratory distress. No wheezes  GI: Soft. Bowel sounds are normal. She exhibits no distension. Non-distended Neurological: She is alert. Follows direction   RUE  4+/5. RLE also  4+/5. Improving volitional control and coordination. Has LT sensation on the right. No evidence of field cut with confrontation testing.   Has difficulties with expressive language at times with apraxia, uses paraphrases at times-  Skin: Skin is warm and dry. No perineal rash Psychiatric:  not anxious. Very pleasant.   Assessment/Plan: 1. Functional deficits secondary to Thrombotic left MCA infarct which require 3+ hours per day of interdisciplinary therapy in a comprehensive inpatient rehab setting. Physiatrist is  providing close team supervision and 24 hour management of active medical problems listed below. Physiatrist and rehab team continue to assess barriers to discharge/monitor patient progress toward functional and medical goals.   FIM: FIM - Bathing Bathing Steps Patient Completed: Chest;Right Arm;Left upper leg;Right upper leg;Left Arm;Right lower leg (including foot);Abdomen;Left lower leg (including foot);Front perineal area;Buttocks Bathing: 5: Supervision: Safety issues/verbal cues  FIM - Upper Body Dressing/Undressing Upper body dressing/undressing steps patient completed: Thread/unthread right bra strap;Thread/unthread left bra strap;Thread/unthread right sleeve of pullover shirt/dresss;Put head through opening of pull over shirt/dress;Thread/unthread left sleeve of pullover shirt/dress;Pull shirt over trunk;Hook/unhook bra Upper body dressing/undressing: 5: Supervision: Safety issues/verbal cues FIM - Lower Body Dressing/Undressing Lower body dressing/undressing steps patient completed: Thread/unthread right underwear leg;Thread/unthread left underwear leg;Pull underwear up/down;Thread/unthread right pants leg;Thread/unthread left pants leg;Pull pants up/down;Fasten/unfasten pants;Don/Doff left shoe;Fasten/unfasten right shoe;Fasten/unfasten left shoe;Don/Doff right sock;Don/Doff left sock;Don/Doff right shoe Lower body dressing/undressing: 5: Supervision: Safety issues/verbal cues  FIM - Toileting Toileting steps completed by patient: Adjust clothing prior to toileting;Performs perineal hygiene;Adjust clothing after toileting Toileting Assistive Devices: Grab bar or rail for support Toileting: 5: Supervision: Safety issues/verbal cues  FIM - Radio producer Devices: Grab bars Toilet Transfers: 5-To toilet/BSC: Supervision (verbal cues/safety issues);5-From toilet/BSC: Supervision (verbal cues/safety issues)  FIM - Engineer, site  Assistive Devices: Arm rests Bed/Chair Transfer: 5: Bed > Chair or W/C: Supervision (verbal cues/safety issues);5: Chair or W/C > Bed: Supervision (verbal cues/safety issues)  FIM - Locomotion: Wheelchair Locomotion: Wheelchair: 0: Activity did not occur FIM - Locomotion: Ambulation  Locomotion: Ambulation Assistive Devices: Other (comment) (none) Ambulation/Gait Assistance: 4: Min assist;5: Supervision Locomotion: Ambulation: 5: Travels 150 ft or more with supervision/safety issues  Comprehension Comprehension Mode: Auditory Comprehension: 4-Understands basic 75 - 89% of the time/requires cueing 10 - 24% of the time  Expression Expression Mode: Verbal Expression: 3-Expresses basic 50 - 74% of the time/requires cueing 25 - 50% of the time. Needs to repeat parts of sentences.  Social Interaction Social Interaction: 4-Interacts appropriately 75 - 89% of the time - Needs redirection for appropriate language or to initiate interaction.  Problem Solving Problem Solving: 4-Solves basic 75 - 89% of the time/requires cueing 10 - 24% of the time  Memory Memory: 4-Recognizes or recalls 75 - 89% of the time/requires cueing 10 - 24% of the time  Medical Problem List and Plan:  1. Embolic left MCA infarct  2. DVT Prophylaxis/Anticoagulation: Subcutaneous Lovenox.   3. Pain Management: Tylenol as needed.  4. Neuropsych: This patient is not capable of making decisions on her own behalf.  5. Dysphagia/aphasia. Followup speech therapy. Currently maintained on mechanical soft diet and eating well 6. Hypertension. Aldactone 12.5 mg daily. Monitor with increased activity  7. Acute sinusitis. Augmentin has been completed  8. Hyperlipidemia. Lipitor  9. GERD. Protonix  10. Asthma. Continue MDI's. No problems at present. Chest clear 11.  Urinary frequency- CVA related  12.  ?hematochezia, will ask RN to monitor , guaic stool, recent UA -, Hgb stable and normal LOS (Days) 10 A FACE TO FACE EVALUATION  WAS PERFORMED  Andrea Mason 12/05/2013 9:18 AM

## 2013-12-06 ENCOUNTER — Inpatient Hospital Stay (HOSPITAL_COMMUNITY): Payer: BC Managed Care – PPO | Admitting: Speech Pathology

## 2013-12-06 ENCOUNTER — Inpatient Hospital Stay (HOSPITAL_COMMUNITY): Payer: BC Managed Care – PPO | Admitting: *Deleted

## 2013-12-06 ENCOUNTER — Inpatient Hospital Stay (HOSPITAL_COMMUNITY): Payer: BC Managed Care – PPO | Admitting: Occupational Therapy

## 2013-12-06 DIAGNOSIS — I10 Essential (primary) hypertension: Secondary | ICD-10-CM

## 2013-12-06 DIAGNOSIS — I6350 Cerebral infarction due to unspecified occlusion or stenosis of unspecified cerebral artery: Secondary | ICD-10-CM

## 2013-12-06 NOTE — Progress Notes (Signed)
Patient ID: Andrea Mason, female   DOB: 1951/10/31, 63 y.o.   MRN: [id number]    Subjective/Complaints:  11/58.  63 year old patient admitted to rehabilitation following a embolic left MCA infarct. Medical problems include obesity hypertension and depression. She has a history of dyslipidemia and asthma. Alert appropriate and ambulatory Doing well with PT, able to kick large ball  A 12 point review of systems has been performed and if not noted above is otherwise negative.   Objective: Vital Signs: Blood pressure 117/75, pulse 80, temperature 97.7 F (36.5 C), temperature source Oral, resp. rate 18, height 5\' 3"  (1.6 m), weight 97.705 kg (215 lb 6.4 oz), SpO2 99.00%. No results found.  Recent Labs  12/04/13 1144  WBC 6.7  HGB 12.9  HCT 38.2  PLT 244   No results found for this basename: NA, K, CL, CO, GLUCOSE, BUN, CREATININE, CALCIUM,  in the last 72 hours CBG (last 3)  No results found for this basename: GLUCAP,  in the last 72 hours  Wt Readings from Last 3 Encounters:  12/03/13 97.705 kg (215 lb 6.4 oz)  11/20/13 96.6 kg (212 lb 15.4 oz)  11/14/13 99.338 kg (219 lb)   Patient Vitals for the past 24 hrs:  BP Temp Temp src Pulse Resp SpO2  12/06/13 0555 117/75 mmHg 97.7 F (36.5 C) Oral 80 18 99 %  12/05/13 1937 - - - - - 98 %  12/05/13 1534 119/86 mmHg 97.4 F (36.3 C) Oral 84 18 99 %     Intake/Output Summary (Last 24 hours) at 12/06/13 1220 Last data filed at 12/06/13 0900  Gross per 24 hour  Intake    720 ml  Output      0 ml  Net    720 ml   Past Medical History  Diagnosis Date  . Hypertension   . GERD (gastroesophageal reflux disease)   . Allergic rhinitis   . Asthma   . Obesity   . Depression   . History of colonic polyps   . DJD (degenerative joint disease), cervical   . 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 Nasal sinus surgery  07/2006    Dr. [name]  . Nasal polyp surgery  07/2006    x  2 with Dr. [name]  . Foot surgery  1998  . Tubal ligation       Physical Exam:  Vitals reviewed.  General: pleasant and NAD. A little fatigued appearing  HENT: oral mucosa pink and moist. Dentition is fair  Head: Normocephalic.  Eyes: EOM are normal.  Neck: Normal range of motion. Neck supple. No thyromegaly present.  Cardiovascular: Normal rate and regular rhythm. No murmurs, rubs, gallops  Respiratory: Effort normal and breath sounds normal. No respiratory distress. No wheezes  GI: Soft. Bowel sounds are normal. She exhibits no distension. Non-distended Neurological: She is alert. Follows direction   RUE  4+/5. RLE also  4+/5. Improving volitional control and coordination.Skin: Skin is warm and dry. No perineal rash Psychiatric:  not anxious. Very pleasant.   Assessment/Plan: 1. Functional deficits secondary to Thrombotic left MCA infarct which require 3+ hours per day of interdisciplinary therapy in a comprehensive inpatient rehab setting.  Medical Problem List and Plan:  1. Embolic left MCA infarct  2. DVT Prophylaxis/Anticoagulation: Subcutaneous Lovenox.   3. Hypertension. Aldactone 12.5 mg daily. Monitor with increased activity  4. Hyperlipidemia. Lipitor  5. GERD. Protonix  6.  Asthma. Continue MDI's. No problems  at present. Chest clear   LOS (Days) 11 A FACE TO FACE EVALUATION WAS PERFORMED  Nyoka Cowden 12/06/2013 12:19 PM

## 2013-12-06 NOTE — Progress Notes (Signed)
Physical Therapy Session Note  Patient Details  Name: Andrea Mason MRN: [id number] Date of Birth: [birth year]  Today's Date: 12/06/2013 Time: 0900-0955 Time Calculation (min): 55 min  Skilled Therapeutic Interventions/Progress Updates:  Gait training with Supervision and occasional verbal cues,when loosing focus. On a distance of 400 feet w/o signs of  Exertion or pain, with no AD and no hand held assistance.  NuStep 1 x 10 min level 6. BP 142/88 HR 104 SaO2 96% Obstacle courses 2 x with focus of R LE motor control and precision of movement. Step ups on 6 inch steps 2 15, stepping up/down with cues for correct sequencing 2 x12, Squats 2 x 10. Reciprocal gait training with focus on Hip and Shoulder rotation and facilitation of appropriate pattern.Tandem walking ffwd  And backwards with min A. Side stepping with min A and HHA. During exercises and activities patient needs reeducation on breathing techniques and energy conservation principles. Patient returned to the room and left with all needs within reach.   Therapy Documentation Precautions:  Precautions Precautions: Fall Restrictions Weight Bearing Restrictions: No  See FIM for current functional status  Therapy/Group: Individual Therapy  [name] Spanish 12/06/2013, 1:00 PM

## 2013-12-06 NOTE — Progress Notes (Signed)
Speech Language Pathology Daily Session Note  Patient Details  Name: Andrea Mason MRN: [id number] Date of Birth: [birth year]  Today's Date: 12/06/2013 Time: 8676-1950 Time Calculation (min): 41 min  Short Term Goals: Week 2: SLP Short Term Goal 1 (Week 2): Pt will demonstrate effective mastication and oral clearance with regular textures with Min cues. SLP Short Term Goal 2 (Week 2): Pt will follow two-step commands during functional task with Min cues SLP Short Term Goal 3 (Week 2): Pt will demonstrate emergent awareness of impairments with Min cues SLP Short Term Goal 4 (Week 2): Pt will sustain attention to functional task for 30 min with Min cues SLP Short Term Goal 5 (Week 2): Pt will utilize word-finiding strategies during structured linguistic task with Mod cues  Skilled Therapeutic Interventions: Therapeutic intervention complete with short term goals addressed.  Patient given trial of regular consistencies and had no s/s of aspiration.  She had trace residue that cleared easily with liquid wash.  She was able to use word finding strategies with mod verbal cues and required min cues to maintain attention to functional task.  Continue with current treatment plan.    FIM:  Comprehension Comprehension Mode: Auditory Comprehension: 6-Follows complex conversation/direction: With extra time/assistive device Expression Expression Mode: Verbal Expression: 4-Expresses basic 75 - 89% of the time/requires cueing 10 - 24% of the time. Needs helper to occlude trach/needs to repeat words. Social Interaction Social Interaction: 6-Interacts appropriately with others with medication or extra time (anti-anxiety, antidepressant). Memory Memory: 6-More than reasonable amt of time FIM - Eating Eating Activity: 7: Complete independence:no helper  Pain Pain Assessment Pain Assessment: No/denies pain  Therapy/Group: Individual Therapy  Frances Maywood 12/06/2013, 4:40 PM

## 2013-12-06 NOTE — Progress Notes (Signed)
Occupational Therapy Session Note  Patient Details  Name: Andrea Mason MRN: [id number] Date of Birth: 27-Mar-1951  Today's Date: 12/06/2013 Time: 1500-1530 Time Calculation (min): 30 min  Short Term Goals: Week 2:  OT Short Term Goal 1 (Week 2): STGs = LTGs   Skilled Therapeutic Interventions/Progress Updates:   Patient seen for PM occupational therapy session with emphasis on ADL retraining and instruction, functional mobility, IADL retraining and instruction, balance, activity tolerance, R sided attention, and recommended DME.  Patient ambulates from hospital room to rehab apartment with supervision without assistive device.  Patient completes transfer onto/off transfer tub bench with SBA/close supervision.  Patient requires verbal cues to take her time during transfers and ambulation.  Patient educated on recommended DME, including transfer tub bench upon discharge home to decrease fall risk.  Patient completes IADL snack preparation in kitchen.  Patient completes hand hygiene initially and then completes dynamic standing/reaching task to locate/remove items from top 2 shelves of cabinet.  Patient completes task with SBA.  Patient completes snack preparation with supervision and minimum verbal cues for sequencing and organization.  Patient completes dishwashing task with supervision and minimum verbal cues for sequencing and organization.  Patient ambulates to hospital room with supervision.  Patient with good performance and participation this afternoon.  Patient motivated.  Patient continues to have challenges with aphasia and R sided attention.  Therapy Documentation Precautions:  Precautions Precautions: Fall Restrictions Weight Bearing Restrictions: No  Pain:  Patient denies pain.  See FIM for current functional status  Therapy/Group: Individual Therapy  Osa Craver 12/06/2013, 3:35 PM

## 2013-12-06 NOTE — Progress Notes (Signed)
Occupational Therapy Session Note  Patient Details  Name: Andrea Mason MRN: [id number] Date of Birth: [birth year]  Today's Date: 12/06/2013 Time: 3149-7026 Time Calculation (min): 60 min  Skilled Therapeutic Interventions/Progress Updates: ADL in room shower via tub transfer bench and utilizing long handled sponge for feet and periarea bathing and rinsing.  Patient able to cross legs to wash feet but will be safer on tub transfer bench that is closed to the shower floor.  Otherwise focus on LB bathing and dressing.  Patient completed very well with overall distant S.     Therapy Documentation Precautions:  Precautions Precautions: Fall Restrictions Weight Bearing Restrictions: No    Pain: denied    See FIM for current functional status  Therapy/Group: Individual Therapy  Alfredia Ferguson [facility] 12/06/2013, 9:01 AM

## 2013-12-07 NOTE — Progress Notes (Signed)
Patient ID: Brittiney Dicostanzo, female   DOB: 10/17/1951, 63 y.o.   MRN: [id number]  Patient ID: Rayna Brenner, female   DOB: Apr 22, 1951, 63 y.o.   MRN: [id number]    Subjective/Complaints:  1/4. .  63 year old patient admitted to rehabilitation following a embolic left MCA infarct. Medical problems include obesity hypertension and depression. She has a history of dyslipidemia and asthma. Alert appropriate and ambulatory Doing well with PT, able to kick large ball. No new concerns or complaints   A 12 point review of systems has been performed and if not noted above is otherwise negative.   Objective: Vital Signs: Blood pressure 146/78, pulse 72, temperature 97.9 F (36.6 C), temperature source Oral, resp. rate 18, height 5\' 3"  (1.6 m), weight 97.705 kg (215 lb 6.4 oz), SpO2 98.00%. No results found.  Recent Labs  12/04/13 1144  WBC 6.7  HGB 12.9  HCT 38.2  PLT 244   No results found for this basename: NA, K, CL, CO, GLUCOSE, BUN, CREATININE, CALCIUM,  in the last 72 hours CBG (last 3)  No results found for this basename: GLUCAP,  in the last 72 hours  Wt Readings from Last 3 Encounters:  12/03/13 97.705 kg (215 lb 6.4 oz)  11/20/13 96.6 kg (212 lb 15.4 oz)  11/14/13 99.338 kg (219 lb)   Patient Vitals for the past 24 hrs:  BP Temp Temp src Pulse Resp SpO2  12/07/13 0959 - - - - - 98 %  12/07/13 0443 146/78 mmHg 97.9 F (36.6 C) Oral 72 18 98 %  12/06/13 2125 - - - - - 97 %  12/06/13 1500 136/86 mmHg 98.5 F (36.9 C) Oral 88 18 98 %     Intake/Output Summary (Last 24 hours) at 12/07/13 1115 Last data filed at 12/07/13 0900  Gross per 24 hour  Intake   1440 ml  Output      0 ml  Net   1440 ml   Past Medical History  Diagnosis Date  . Hypertension   . GERD (gastroesophageal reflux disease)   . Allergic rhinitis   . Asthma   . Obesity   . Depression   . History of colonic polyps   . DJD (degenerative joint disease), cervical   . 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 Nasal sinus surgery  07/2006    Dr. [name]  . Nasal polyp surgery  07/2006    x 2 with Dr. [name]  . Foot surgery  1998  . Tubal ligation       Physical Exam:  Vitals reviewed.  General: pleasant and NAD. A little fatigued appearing  HENT: oral mucosa pink and moist. Dentition is fair  Head: Normocephalic.  Eyes: EOM are normal.  Neck: Normal range of motion. Neck supple. No thyromegaly present.  Cardiovascular: Normal rate and regular rhythm. No murmurs, rubs, gallops  Respiratory: Effort normal and breath sounds normal. No respiratory distress. No wheezes  GI: Soft. Bowel sounds are normal. She exhibits no distension. Non-distended Neurological: She is alert. Follows direction   RUE  4+/5. RLE also  4+/5. Improving volitional control and coordination.Skin: Skin is warm and dry. No perineal rash Psychiatric:  not anxious. Very pleasant.   Assessment/Plan: 1. Functional deficits secondary to Thrombotic left MCA infarct which require 3+ hours per day of interdisciplinary therapy in a comprehensive inpatient rehab setting.  Medical Problem List and Plan:  1. Embolic left MCA infarct  2. DVT  Prophylaxis/Anticoagulation: Subcutaneous Lovenox.   3. Hypertension. Aldactone 12.5 mg daily. Monitor with increased activity;  Remains well controlled  4. Hyperlipidemia. Lipitor  5. GERD. Protonix  6.  Asthma. Continue MDI's. No problems at present. Chest clear   LOS (Days) 12 A FACE TO FACE EVALUATION WAS PERFORMED  Nyoka Cowden 12/07/2013 11:15 AM

## 2013-12-08 ENCOUNTER — Inpatient Hospital Stay (HOSPITAL_COMMUNITY): Payer: BC Managed Care – PPO

## 2013-12-08 ENCOUNTER — Inpatient Hospital Stay (HOSPITAL_COMMUNITY): Payer: BC Managed Care – PPO | Admitting: Occupational Therapy

## 2013-12-08 DIAGNOSIS — I6330 Cerebral infarction due to thrombosis of unspecified cerebral artery: Secondary | ICD-10-CM

## 2013-12-08 DIAGNOSIS — I69920 Aphasia following unspecified cerebrovascular disease: Secondary | ICD-10-CM

## 2013-12-08 DIAGNOSIS — G8110 Spastic hemiplegia affecting unspecified side: Secondary | ICD-10-CM

## 2013-12-08 DIAGNOSIS — I10 Essential (primary) hypertension: Secondary | ICD-10-CM

## 2013-12-08 NOTE — Progress Notes (Signed)
Speech Language Pathology Daily Session Note  Patient Details  Name: Andrea Mason MRN: [id number] Date of Birth: Jan 28, 1951  Today's Date: 12/08/2013 Time: 0900-0915 Time Calculation (min): 45 min  Short Term Goals: Week 2: SLP Short Term Goal 1 (Week 2): Pt will demonstrate effective mastication and oral clearance with regular textures with Min cues. SLP Short Term Goal 2 (Week 2): Pt will follow two-step commands during functional task with Min cues SLP Short Term Goal 3 (Week 2): Pt will demonstrate emergent awareness of impairments with Min cues SLP Short Term Goal 4 (Week 2): Pt will sustain attention to functional task for 30 min with Min cues SLP Short Term Goal 5 (Week 2): Pt will utilize word-finiding strategies during structured linguistic task with Mod cues  Skilled Therapeutic Interventions: Skilled treatment focused on cognitive-linguistic goals. SLP facilitated session with structured cognitive-linguistic task. Pt demonstrated intellectual awareness of physical and cognitive-linguistic impairments with supervision level question cues, and recalled word-finding strategies with Min question cues without use of external memory aid. Pt utilized word-finding strategies during structured task with Mod cues for more specific descriptions. She sorted her cards into three categories with supervision level verbal cues for accuracy. Pt demonstrated increased reading abilities today, accurately reading aloud the words on her cards. Pt was able to read and perform commands written at the sentence level with Mod I, and demonstrated adequate decoding and comprehension at the short paragraph level by reading and then answering yes/no questions with Mod I. Continue plan of care.   FIM:  Comprehension Comprehension Mode: Auditory Comprehension: 4-Understands basic 75 - 89% of the time/requires cueing 10 - 24% of the time Expression Expression Mode: Verbal Expression: 3-Expresses basic 50 - 74% of  the time/requires cueing 25 - 50% of the time. Needs to repeat parts of sentences. Social Interaction Social Interaction: 6-Interacts appropriately with others with medication or extra time (anti-anxiety, antidepressant). Problem Solving Problem Solving: 4-Solves basic 75 - 89% of the time/requires cueing 10 - 24% of the time Memory Memory: 4-Recognizes or recalls 75 - 89% of the time/requires cueing 10 - 24% of the time FIM - Eating Eating Activity: 7: Complete independence:no helper  Pain  No/denies pain  Therapy/Group: Individual Therapy   Germain Osgood, M.A. CCC-SLP [phone]   Germain Osgood 12/08/2013, 2:16 PM

## 2013-12-08 NOTE — Progress Notes (Signed)
Physical Therapy Weekly Progress Note  Patient Details  Name: Andrea Mason MRN: [id number] Date of Birth: 1951/04/12  Today's Date: 12/08/2013 Time:0800-0900; 1405-1440 60 min, 35 min  individual therapy No pain reported AM or PM  -    Pt has met 8 of 10  LTGs. She still has difficulty remembering techniques for safety and efficiency of mobility, and needs more practice in crowded community environments.  Patient continues to demonstrate the following deficits: balance, activity tolerance, coordination, R attention, cognition and therefore will continue to benefit from skilled PT intervention to enhance overall performance with activity tolerance, balance, ability to compensate for deficits, functional use of  right upper extremity and right lower extremity, attention, awareness and coordination.  Patient progressing toward long term goals..  Continue plan of care.  PT Short Term Goals  Week 2:  PT Short Term Goal 1 (Week 2): STG=LTG  Skilled Therapeutic Interventions/Progress Updates:   Tx 1:  Focused on high level balance and coordination activities including bil UE and bil LE tasks concurrently in standing, 1/2 standing, dribbling ball during sideways and forwards walking. Pt required seated rest breaks between each task.    Up/down flight of steps, 1 rail, supervision.  Pt had difficulty remembering sequence for LEs on steps: she chose to ascend with RLE leading, safely but with difficulty and slight buckling as she fatigued.  With 1 cue, she descended with RLe leading. Without cue, pt was able to indicate by pointing the safest LE to lead up and down steps with, after task.   neuromuscular re-education via forced use for LE full wt shifting in standing, while holding a ball, on Kinetron set at 20 cm/sec, x 5 minutes  Tx 2:  Gait training for velocity, coordination, congested and distracting environments. Velocity for 30 m timed test in controlled env = 4.7'/second.  Gait with  supervision x 200' in controlled  environments, without AD; in community environment, pt performed gait in congested gift shop and around tables and chairs with supervision and with min guard when entering crowded elevator    In lobby of hospital, pt negotiated sitting at an armless chair at table, safely.  When entering a crowded elevator, pt demonstrated slight R inattention, nearly bumping into a person on the R, holding a cup of hot coffee.  Pt later reported she was trying to figure out whether one of the people was exiting the elevator or not, and did not notice the woman standing in the elevator on the R side.  Pt reported that her mother's senior apartment where she will d/c to does not require use of an elevator.  Therapy Documentation Precautions:  Precautions Precautions: Fall Restrictions Weight Bearing Restrictions: No     See FIM for current functional status  Therapy/Group: Individual Therapy  Lashannon Bresnan 12/08/2013, 8:37 AM

## 2013-12-08 NOTE — Progress Notes (Signed)
Subjective/Complaints: Strength improving but still has coordination deficits on R side Aphasic with word substitution A 12 point review of systems has been performed and if not noted above is otherwise negative.   Objective: Vital Signs: Blood pressure 134/87, pulse 83, temperature 97.6 F (36.4 C), temperature source Oral, resp. rate 17, height 5\' 3"  (1.6 m), weight 97.705 kg (215 lb 6.4 oz), SpO2 97.00%. No results found. No results found for this basename: WBC, HGB, HCT, PLT,  in the last 72 hours No results found for this basename: NA, K, CL, CO, GLUCOSE, BUN, CREATININE, CALCIUM,  in the last 72 hours CBG (last 3)  No results found for this basename: GLUCAP,  in the last 72 hours  Wt Readings from Last 3 Encounters:  12/03/13 97.705 kg (215 lb 6.4 oz)  11/20/13 96.6 kg (212 lb 15.4 oz)  11/14/13 99.338 kg (219 lb)    Physical Exam:  Vitals reviewed.  General: pleasant and NAD. A little fatigued appearing  HENT: oral mucosa pink and moist. Dentition is fair  Head: Normocephalic.  Eyes: EOM are normal.  Neck: Normal range of motion. Neck supple. No thyromegaly present.  Cardiovascular: Normal rate and regular rhythm. No murmurs, rubs, gallops  Respiratory: Effort normal and breath sounds normal. No respiratory distress. No wheezes  GI: Soft. Bowel sounds are normal. She exhibits no distension. Non-distended Neurological: She is alert. Follows direction   RUE  4+/5. RLE also  4+/5. Improving volitional control and coordination. Has LT sensation on the right. No evidence of field cut with confrontation testing.   Has difficulties with expressive language at times with apraxia, uses paraphrases at times- , decreased fine motor  Skin: Skin is warm and dry. No perineal rash Psychiatric:  not anxious. Very pleasant.   Assessment/Plan: 1. Functional deficits secondary to Thrombotic left MCA infarct which require 3+ hours per day of interdisciplinary therapy in a comprehensive  inpatient rehab setting. Physiatrist is providing close team supervision and 24 hour management of active medical problems listed below. Physiatrist and rehab team continue to assess barriers to discharge/monitor patient progress toward functional and medical goals.   FIM: FIM - Bathing Bathing Steps Patient Completed: Chest;Right Arm;Left Arm;Abdomen;Front perineal area;Buttocks;Right upper leg;Left upper leg;Right lower leg (including foot);Left lower leg (including foot) Bathing: 5: Supervision: Safety issues/verbal cues  FIM - Upper Body Dressing/Undressing Upper body dressing/undressing steps patient completed: Thread/unthread right bra strap;Thread/unthread left bra strap;Thread/unthread right sleeve of pullover shirt/dresss;Thread/unthread left sleeve of pullover shirt/dress;Put head through opening of pull over shirt/dress;Pull shirt over trunk Upper body dressing/undressing: 4: Min-Patient completed 75 plus % of tasks FIM - Lower Body Dressing/Undressing Lower body dressing/undressing steps patient completed: Thread/unthread right underwear leg;Don/Doff right sock;Don/Doff left sock;Thread/unthread left underwear leg;Pull underwear up/down;Don/Doff right shoe;Don/Doff left shoe;Thread/unthread right pants leg;Thread/unthread left pants leg;Pull pants up/down;Fasten/unfasten pants Lower body dressing/undressing: 5: Set-up assist to: Obtain clothing  FIM - Toileting Toileting steps completed by patient: Adjust clothing prior to toileting;Performs perineal hygiene;Adjust clothing after toileting Toileting Assistive Devices: Grab bar or rail for support Toileting: 5: Supervision: Safety issues/verbal cues  FIM - Radio producer Devices: Grab bars Toilet Transfers: 5-Set-up assist to: Apply orthosis/W/C setup  FIM - Control and instrumentation engineer Devices: Arm rests Bed/Chair Transfer: 5: Bed > Chair or W/C: Supervision (verbal cues/safety  issues);5: Chair or W/C > Bed: Supervision (verbal cues/safety issues)  FIM - Locomotion: Wheelchair Locomotion: Wheelchair: 0: Activity did not occur FIM - Locomotion: Ambulation Locomotion: Ambulation Assistive Devices: Other (  comment) (none) Ambulation/Gait Assistance: 4: Min assist;5: Supervision Locomotion: Ambulation: 5: Travels 150 ft or more with supervision/safety issues  Comprehension Comprehension Mode: Auditory Comprehension: 4-Understands basic 75 - 89% of the time/requires cueing 10 - 24% of the time  Expression Expression Mode: Verbal Expression: 3-Expresses basic 50 - 74% of the time/requires cueing 25 - 50% of the time. Needs to repeat parts of sentences.  Social Interaction Social Interaction: 6-Interacts appropriately with others with medication or extra time (anti-anxiety, antidepressant).  Problem Solving Problem Solving: 4-Solves basic 75 - 89% of the time/requires cueing 10 - 24% of the time  Memory Memory: 4-Recognizes or recalls 75 - 89% of the time/requires cueing 10 - 24% of the time  Medical Problem List and Plan:  1. 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 DVT Prophylaxis/Anticoagulation: Subcutaneous Lovenox.   3. Pain Management: Tylenol as needed.  4. Neuropsych: This patient is not capable of making decisions on her own behalf.  5. Dysphagia/aphasia. Followup speech therapy. Currently maintained on mechanical soft diet and eating well 6. Hypertension. Aldactone 12.5 mg daily. Monitor with increased activity  7. Acute sinusitis. Augmentin has been completed  8. Hyperlipidemia. Lipitor  9. GERD. Protonix  10. Asthma. Continue MDI's. No problems at present. Chest clear 11.  Urinary frequency- CVA related  12.  ?hematochezia, no further c/os LOS (Days) 13 A FACE TO FACE EVALUATION WAS PERFORMED  KIRSTEINS,ANDREW E 12/08/2013 8:32 AM

## 2013-12-08 NOTE — Progress Notes (Signed)
PRN sorbitol given on previous shift, with large, continent results.Patrici Ranks A

## 2013-12-08 NOTE — Progress Notes (Signed)
Occupational Therapy Session Note  Patient Details  Name: Andrea Mason MRN: [id number] Date of Birth: 04-Mar-1951  Today's Date: 12/08/2013 Time: 1035-1130 Time Calculation (min): 55 min  Short Term Goals: Week 1:  OT Short Term Goal 1 (Week 1): Pt will demonstrate improved praxis by donning pants over feet without assist. OT Short Term Goal 1 - Progress (Week 1): Met OT Short Term Goal 2 (Week 1): Pt will demonstrate improved standing balance by pulling pants over hips with steadying assist. OT Short Term Goal 2 - Progress (Week 1): Met OT Short Term Goal 3 (Week 1): Pt will be able to transfer to the toilet with steadying assist with a stand pivot transfer. OT Short Term Goal 3 - Progress (Week 1): Met OT Short Term Goal 4 (Week 1): Pt will demonstrate improved RUE coordination so that she can fasten her bra in front with min assist. OT Short Term Goal 4 - Progress (Week 1): Met OT Short Term Goal 5 (Week 1): Pt will demonstrate improved sitting balance to bathe her feet with close supervision from a seated position. OT Short Term Goal 5 - Progress (Week 1): Met  Week 2:  OT Short Term Goal 1 (Week 2): STGs = LTGs   Skilled Therapeutic Interventions/Progress Updates:  Patient found seated in recliner. Patient requested to take a shower as opposed to bathing at sink. Patient gathered all necessary items, then ambulated into bathroom with supervision. Patient completed UB/LB bathing with supervision in sit<>stand position. Patient then completed UB/LB dressing in room from recliner level in sit<>stand position. Patient donned bilateral TEDs with set-up assistance. Patient stood at sink for grooming task of brushing teeth. Patient presented with expressive aphasia and worked hard to communicate with therapist. Patient functioning at an overall supervision level.  At end of session, left patient seated in recliner with all needed items within reach.   Precautions:  Precautions Precautions:  Fall Restrictions Weight Bearing Restrictions: No  See FIM for current functional status  Therapy/Group: Individual Therapy  Eliz Nigg 12/08/2013, 11:51 AM

## 2013-12-08 NOTE — Plan of Care (Signed)
Problem: RH KNOWLEDGE DEFICIT Goal: RH STG INCREASE KNOWLEDGE OF HYPERTENSION Patient/caregiver will be able to verbalize independently methods used to maintain/manage high blood pressure at home( diet, medications, follow up with PCP,etc)  Outcome: Progressing Aldactone 12.5 mg po daily

## 2013-12-08 NOTE — Plan of Care (Signed)
Problem: RH BOWEL ELIMINATION Goal: RH STG MANAGE BOWEL WITH ASSISTANCE STG Manage Bowel with Modified independent Assistance.  Outcome: Progressing Last BM today

## 2013-12-09 ENCOUNTER — Inpatient Hospital Stay (HOSPITAL_COMMUNITY): Payer: BC Managed Care – PPO

## 2013-12-09 ENCOUNTER — Inpatient Hospital Stay (HOSPITAL_COMMUNITY): Payer: BC Managed Care – PPO | Admitting: Physical Therapy

## 2013-12-09 ENCOUNTER — Inpatient Hospital Stay (HOSPITAL_COMMUNITY): Payer: BC Managed Care – PPO | Admitting: *Deleted

## 2013-12-09 ENCOUNTER — Inpatient Hospital Stay (HOSPITAL_COMMUNITY): Payer: BC Managed Care – PPO | Admitting: Occupational Therapy

## 2013-12-09 DIAGNOSIS — I10 Essential (primary) hypertension: Secondary | ICD-10-CM

## 2013-12-09 DIAGNOSIS — I69920 Aphasia following unspecified cerebrovascular disease: Secondary | ICD-10-CM

## 2013-12-09 DIAGNOSIS — I6330 Cerebral infarction due to thrombosis of unspecified cerebral artery: Secondary | ICD-10-CM

## 2013-12-09 DIAGNOSIS — G8110 Spastic hemiplegia affecting unspecified side: Secondary | ICD-10-CM

## 2013-12-09 LAB — CREATININE, SERUM
Creatinine, Ser: 0.81 mg/dL (ref 0.50–1.10)
GFR calc Af Amer: 90 mL/min — ABNORMAL LOW (ref >90)
GFR calc non Af Amer: 77 mL/min — ABNORMAL LOW (ref >90)

## 2013-12-09 MED ORDER — SENNOSIDES-DOCUSATE SODIUM 8.6-50 MG PO TABS
2.0000 | ORAL_TABLET | Freq: Two times a day (BID) | ORAL | Status: DC
  Administered 2013-12-09 – 2013-12-10 (×3): 2 via ORAL
  Filled 2013-12-09 (×5): qty 2

## 2013-12-09 MED ORDER — OXYBUTYNIN CHLORIDE 5 MG PO TABS
5.0000 mg | ORAL_TABLET | Freq: Every day | ORAL | Status: DC
  Administered 2013-12-09: 5 mg via ORAL
  Filled 2013-12-09 (×2): qty 1

## 2013-12-09 NOTE — Progress Notes (Signed)
Occupational Therapy Discharge Summary  Patient Details  Name: Asja Frommer MRN: [id number] Date of Birth: 1951/03/01  Today's Date: 12/09/2013  Patient has met 9 of 9 long term goals due to improved activity tolerance, improved balance, postural control, ability to compensate for deficits, functional use of  RIGHT upper extremity, improved attention, improved awareness and improved coordination.  Patient to discharge at overall Supervision level.  Patient's care partner is independent to provide the necessary physical assistance at discharge.  Pt's daughter in law was not able to attend the OT session, but did come to the PT session.  Pt only needs supervision for ADLs for safe mobility.  Reasons goals not met: n/a  Recommendation:  Patient will benefit from ongoing skilled OT services in home health setting to continue to advance functional skills in the area of BADL and iADL.  Equipment: No equipment provided. Pt's mother's home has a tub bench.  Reasons for discharge: treatment goals met  Patient/family agrees with progress made and goals achieved: Yes  OT Discharge   ADL  supervision overall Vision/Perception  Vision - History Patient Visual Report: No change from baseline Vision - Assessment Vision Assessment: Vision tested Visual Fields: No apparent deficits Perception Perception: Within Functional Limits Praxis Praxis: Intact  Cognition Overall Cognitive Status: Impaired/Different from baseline Arousal/Alertness: Awake/alert Orientation Level: Oriented X4 Attention: Sustained Sustained Attention: Impaired Sustained Attention Impairment: Verbal basic;Functional basic Memory: Impaired Memory Impairment: Decreased recall of new information;Decreased short term memory Awareness: Impaired Awareness Impairment: Emergent impairment;Anticipatory impairment Problem Solving: Impaired Problem Solving Impairment: Verbal basic;Functional basic Safety/Judgment: Appears  intact Sensation Sensation Light Touch: Impaired by gross assessment Proprioception: Impaired by gross assessment Additional Comments: Light touch impaired in RLE. Proprioception impaired in R ankle and great toe. Coordination Gross Motor Movements are Fluid and Coordinated: No Fine Motor Movements are Fluid and Coordinated: No Coordination and Movement Description: Rapid Alternating Movement impaired in RLE. Heel Shin Test: RLE limited Motor  Motor Motor: Hemiplegia;Motor apraxia Mobility  Bed Mobility Bed Mobility: Sitting - Scoot to Edge of Bed;Rolling Right;Rolling Left;Supine to Sit;Sit to Supine;Scooting to [facility] Rolling Right: 7: Independent Rolling Left: 7: Independent Supine to Sit: 7: Independent Sitting - Scoot to Edge of Bed: 7: Independent Sit to Supine: 7: Independent Scooting to HOB: 7: Independent Transfers Sit to Stand: 7: Independent  Trunk/Postural Assessment  Cervical Assessment Cervical Assessment: Within Functional Limits Thoracic Assessment Thoracic Assessment: Within Functional Limits Lumbar Assessment Lumbar Assessment: Within Functional Limits Postural Control Postural Control: Within Functional Limits  Balance Static Sitting Balance Static Sitting - Balance Support: No upper extremity supported;Feet supported Static Sitting - Level of Assistance: 7: Independent Static Sitting - Comment/# of Minutes: 4 minutes Dynamic Sitting Balance Dynamic Sitting - Balance Support: No upper extremity supported;Feet supported Dynamic Sitting - Level of Assistance: 7: Independent Dynamic Sitting - Comments: 3 minutes reaching forward, laterally, and across midline (with bilat UE's), and outside BOS wthout LOB Static Standing Balance Static Standing - Level of Assistance: 5: Stand by assistance Dynamic Standing Balance Dynamic Standing - Level of Assistance: 5: Stand by assistance Extremity/Trunk Assessment RUE Assessment RUE Assessment: Within Functional  Limits (impaired sensation and coordination, strength WFL) LUE Assessment LUE Assessment: Within Functional Limits  See FIM for current functional status  Eleonora Peeler 12/09/2013, 12:33 PM

## 2013-12-09 NOTE — Progress Notes (Signed)
Physical Therapy Discharge Summary  Patient Details  Name: Andrea Mason MRN: [id number] Date of Birth: 1951-06-01  Today's Date: 12/09/2013 Time: 1100-1200 Time Calculation (min): 60 min  Patient has met 11 of 11 long term goals due to improved activity tolerance, improved balance, improved postural control, increased strength, ability to compensate for deficits, functional use of  right upper extremity and right lower extremity, improved awareness and improved coordination.  Patient to discharge at an ambulatory level Supervision.   Patient's care partner is independent to provide the necessary supervision at discharge.  Reasons goals not met: N/A; all goals met.  Recommendation:  Patient will benefit from ongoing skilled PT services in home health setting to continue to advance safe functional mobility, address ongoing impairments in coordination, proprioception, safety with functional mobility/ambulation, and minimize fall risk.  Equipment: No equipment provided  Reasons for discharge: treatment goals met  Patient/family agrees with progress made and goals achieved: Yes  Skilled Therapeutic Interventions/Progress Updates: Pt received seated in bedside chair having just finished OT session. Pt's daughter-in-law, Beau Fanny, present for family education/training. Tameka reports she will be providing pt transportation, as needed, to grocery store and to MD appts. No hands-on training required, as pt requires supervision with all functional mobility/ambulation.  Therapist, pt, and family member had interactive discussion ragarding pt's impairments, how these impairments affect pt safety with functional mobility. Therapist provided Tameka with methods for cueing pt to increase pt safety/independence with functional mobility. Tameka verbalized understanding.  Car transfer with supervision/cueing for safe technique x1 trial with therapist, x1 with Tameka providing appropriate cues and supervision.  Pt negotiated single 4.5" step with supervision (to simulate primary entrance of home). Pt also negotiated 6.5" step with supervision to ascend, with min A, R HHA of Tameka to descend to ensure pt is safe negotiating community obstacles.   Pt performed w/c mobility x150' in controlled environment with bilat UE's with supervision. Pt performed gait x150' in controlled environment with supervision, x200' in home and community environments with supervision/cueing for obstacle negotiation on R side. Negotiation of 12 stairs with single rail, step-to pattern with supervision, min verbal cueing for sequencing.  Multiple bed<>chair transfers performed with supervision. Supine<>sit performed independently on apartment bed. Floor transfer using apartment couch with supervision of family member. Sit<>stand from apartment couch/love seat with supervision. Session ended in pt room, where pt was left seated in bedside chair with daughter-in-law present and all needs within reach.  PT Discharge Precautions/Restrictions  Fall Vital Signs Therapy Vitals Temp: 97.7 F (36.5 C) Temp src: Oral Pulse Rate: 74 Resp: 18 BP: 136/83 mmHg Patient Position, if appropriate: Sitting Oxygen Therapy SpO2: 99 % O2 Device: None (Room air) Pain Pain Assessment Pain Assessment: No/denies pain Pain Score: 0-No pain Vision/Perception  Vision - History Patient Visual Report: No change from baseline Vision - Assessment Vision Assessment: Vision tested Visual Fields: No apparent deficits Perception Perception: Within Functional Limits Praxis Praxis: Intact  Cognition Orientation Level: Oriented X4 Sensation Sensation Light Touch: Impaired by gross assessment Proprioception: Impaired by gross assessment Additional Comments: Light touch impaired in RLE. Proprioception impaired in R ankle and great toe. Coordination Gross Motor Movements are Fluid and Coordinated: No Fine Motor Movements are Fluid and Coordinated:  No Coordination and Movement Description: Rapid Alternating Movement impaired in RLE. Heel Shin Test: RLE limited Motor  Motor Motor: Hemiplegia;Motor apraxia  Mobility Bed Mobility Bed Mobility: Sitting - Scoot to Edge of Bed;Rolling Right;Rolling Left;Supine to Sit;Sit to Supine;Scooting to [facility] Rolling Right: 7: Independent Rolling  Left: 7: Independent Supine to Sit: 7: Independent Sitting - Scoot to Edge of Bed: 7: Independent Sit to Supine: 7: Independent Scooting to HOB: 7: Independent Transfers Transfers: Yes Sit to Stand: 7: Independent Stand Pivot Transfers: 7: Independent Locomotion  Ambulation Ambulation: Yes Ambulation/Gait Assistance: 5: Supervision Ambulation Distance (Feet): 200 Feet Assistive device: None Ambulation/Gait Assistance Details: Verbal cues for precautions/safety Ambulation/Gait Assistance Details: Min cues for obstacle negotiation (R side) when ambulating in crowded area. Gait Gait: Yes Gait Pattern: Decreased dorsiflexion - right;Step-through pattern High Level Ambulation High Level Ambulation: Side stepping Side Stepping: Supervision, no device. Stairs / Additional Locomotion Stairs: Yes Stairs Assistance: 5: Supervision Stairs Assistance Details: Verbal cues for sequencing Stair Management Technique: One rail Left;Step to pattern;Forwards Number of Stairs: 12 Height of Stairs: 6.5 Curb: 4: Min assist (supervision to ascend; min A (R HHA) to descend) Architect: Yes Wheelchair Assistance: 5: Careers information officer: Both upper extremities Wheelchair Parts Management: Needs assistance;Supervision/cueing;Other (comment) (Supervision/cueing for all w/c parts with exception of leg rests, which pt requires assistance to manage.) Distance: 150  Trunk/Postural Assessment  Cervical Assessment Cervical Assessment: Within Functional Limits Thoracic Assessment Thoracic Assessment: Within Functional  Limits Lumbar Assessment Lumbar Assessment: Within Functional Limits Postural Control Postural Control: Within Functional Limits  Balance Static Sitting Balance Static Sitting - Balance Support: No upper extremity supported;Feet supported Static Sitting - Level of Assistance: 7: Independent Static Sitting - Comment/# of Minutes: 4 minutes Dynamic Sitting Balance Dynamic Sitting - Balance Support: No upper extremity supported;Feet supported Dynamic Sitting - Level of Assistance: 7: Independent Dynamic Sitting - Comments: 3 minutes reaching forward, laterally, and across midline (with bilat UE's), and outside BOS wthout LOB Standardized Balance Assessment Standardized Balance Assessment: Berg Balance Test Berg Balance Test Sit to Stand: Able to stand without using hands and stabilize independently Standing Unsupported: Able to stand safely 2 minutes Sitting with Back Unsupported but Feet Supported on Floor or Stool: Able to sit safely and securely 2 minutes Stand to Sit: Sits safely with minimal use of hands Transfers: Able to transfer safely, minor use of hands Standing Unsupported with Eyes Closed: Able to stand 10 seconds safely Standing Ubsupported with Feet Together: Able to place feet together independently and stand for 1 minute with supervision From Standing, Reach Forward with Outstretched Arm: Can reach confidently >25 cm (10") From Standing Position, Pick up Object from Floor: Able to pick up shoe safely and easily From Standing Position, Turn to Look Behind Over each Shoulder: Looks behind from both sides and weight shifts well Turn 360 Degrees: Able to turn 360 degrees safely in 4 seconds or less Standing Unsupported, Alternately Place Feet on Step/Stool: Able to complete 4 steps without aid or supervision Standing Unsupported, One Foot in Front: Loses balance while stepping or standing Standing on One Leg: Unable to try or needs assist to prevent fall Total Score:  45 Static Standing Balance Static Standing - Level of Assistance: 5: Stand by assistance Dynamic Standing Balance Dynamic Standing - Level of Assistance: 5: Stand by assistance Extremity Assessment  RUE Assessment RUE Assessment: Within Functional Limits (impaired sensation and coordination, strength WFL) LUE Assessment LUE Assessment: Within Functional Limits RLE Assessment RLE Assessment: Exceptions to [facility] RLE Strength RLE Overall Strength Comments: 4/5 R hip/knee flexion; 4/5 R knee extension;  4/5 ankle dorsiflexion and subtalar inversion/eversion LLE Assessment LLE Assessment: Within Functional Limits  See FIM for current functional status  Andrius Andrepont, Malva Cogan 12/09/2013, 4:23 PM

## 2013-12-09 NOTE — Progress Notes (Signed)
Speech Language Pathology Discharge Summary & Final Treatment Note  Patient Details  Name: Andrea Mason MRN: [id number] Date of Birth: [birth year]  Today's Date: 12/09/2013 Time: 1000-1100 Time Calculation (min): 60 min  Skilled Therapeutic Intervention: Treatment focused on education, with no caregivers present for session. SLP facilitated session with education regarding current cognitive-linguistic and swallowing function, with handouts provided for caregivers to utilize. Pt consumed thin liquids via cup sips with Mod I for use of small sips, with no overt s/s of aspiration observed. Pt sustained attention to structured table task for ~30 min with Min redirective cues. She completed 4-step sequencing task with Min cues for basic problem solving. She described pictures with Mod cues for utilization of word-finding strategies. All questions were answered, and pt verbalized her understanding of all information provided.  Patient has met 6 of 6 long term goals.  Patient to discharge at overall Mod level.  Reasons goals not met: N/A   Clinical Impression/Discharge Summary: Pt has met 6 out of 6 LTGs during this admission, making functional gains with swallowing and cognitive-linguistic skills. She is consuming Dys 3 textures and thin liquids with intermittent supervision. She supervision level cues for intellectual awareness as well as Min cues for sustained attention and basic problem solving. She continues to present with a mixed aphasia, with improved communication skills requiring Mod cues for utilization of word-finding strategies in conversation. Caregivers not present for education, so education was provided to patient including handouts to relay information to family. Pt has met all treatment goals and is scheduled to discharge home with 24/7 supervision from family 1/7. Pt will benefit from continued OP SLP services to maximize functional communication, functional independence, and swallowing  safety.  Care Partner:     Type of Caregiver Assistance: Cognitive  Recommendation:  Outpatient SLP;24 hour supervision/assistance  Rationale for SLP Follow Up: Maximize functional communication;Maximize cognitive function and independence;Maximize swallowing safety   Equipment:     Reasons for discharge: Treatment goals met;Discharged from hospital   Patient/Family Agrees with Progress Made and Goals Achieved: Yes   See FIM for current functional status  Germain Osgood, M.A. CCC-SLP [phone]   Germain Osgood 12/09/2013, 12:30 PM

## 2013-12-09 NOTE — Progress Notes (Signed)
Physical Therapy Note  Patient Details  Name: Andrea Mason MRN: [id number] Date of Birth: [birth year] Today's Date: 12/09/2013  1415-1440 (25 minutes) individual Pain: no complaint of pain Focus of treatment: Pre DC Berg balance test Treatment: Berg balance test at admission = 39/56  ; now 45/56.    Jibran Crookshanks,JIM 12/09/2013, 2:34 PM

## 2013-12-09 NOTE — Progress Notes (Signed)
Occupational Therapy Session Note  Patient Details  Name: Andrea Mason MRN: [id number] Date of Birth: 1951/08/14  Today's Date: 12/09/2013 Time: 1000-1100 Time Calculation (min): 60 min  Short Term Goals: Week 1:  OT Short Term Goal 1 (Week 1): Pt will demonstrate improved praxis by donning pants over feet without assist. OT Short Term Goal 1 - Progress (Week 1): Met OT Short Term Goal 2 (Week 1): Pt will demonstrate improved standing balance by pulling pants over hips with steadying assist. OT Short Term Goal 2 - Progress (Week 1): Met OT Short Term Goal 3 (Week 1): Pt will be able to transfer to the toilet with steadying assist with a stand pivot transfer. OT Short Term Goal 3 - Progress (Week 1): Met OT Short Term Goal 4 (Week 1): Pt will demonstrate improved RUE coordination so that she can fasten her bra in front with min assist. OT Short Term Goal 4 - Progress (Week 1): Met OT Short Term Goal 5 (Week 1): Pt will demonstrate improved sitting balance to bathe her feet with close supervision from a seated position. OT Short Term Goal 5 - Progress (Week 1): Met Week 2:  OT Short Term Goal 1 (Week 2): STGs = LTGs   Skilled Therapeutic Interventions/Progress Updates:      Pt seen for BADL retraining of toileting, bathing, and dressing in the tub room with a focus on dynamic balance and bathtub bench transfers. Pt was able to gather all of clothing and supplies for her shower and complete all self care with S and did well transferring in and out of tub bench with S. Discussed home safety and the recommendation for supervision. Pt stated that she understood. Pt returned to her room for the next therapy session and her PT had arrived.  Pt has met goals and ready for discharge.  Therapy Documentation Precautions:  Precautions Precautions: Fall Restrictions Weight Bearing Restrictions: No      Pain: Pain Assessment Pain Assessment: No/denies pain Pain Score: 0-No pain ADL:  See FIM  for current functional status  Therapy/Group: Individual Therapy  [place] 12/09/2013, 12:22 PM

## 2013-12-09 NOTE — Discharge Summary (Signed)
Discharge summary job [phone]

## 2013-12-09 NOTE — Progress Notes (Signed)
Subjective/Complaints: Pt had some urinary freq at noc, also with constipation Aphasic with word substitution A 12 point review of systems has been performed and if not noted above is otherwise negative.   Objective: Vital Signs: Blood pressure 99/55, pulse 77, temperature 98.1 F (36.7 C), temperature source Oral, resp. rate 18, height 5\' 3"  (1.6 m), weight 97.705 kg (215 lb 6.4 oz), SpO2 98.00%. No results found. No results found for this basename: WBC, HGB, HCT, PLT,  in the last 72 hours  Recent Labs  12/09/13 0440  CREATININE 0.81   CBG (last 3)  No results found for this basename: GLUCAP,  in the last 72 hours  Wt Readings from Last 3 Encounters:  12/03/13 97.705 kg (215 lb 6.4 oz)  11/20/13 96.6 kg (212 lb 15.4 oz)  11/14/13 99.338 kg (219 lb)    Physical Exam:  Vitals reviewed.  General: pleasant and NAD. A little fatigued appearing  HENT: oral mucosa pink and moist. Dentition is fair  Head: Normocephalic.  Eyes: EOM are normal.  Neck: Normal range of motion. Neck supple. No thyromegaly present.  Cardiovascular: Normal rate and regular rhythm. No murmurs, rubs, gallops  Respiratory: Effort normal and breath sounds normal. No respiratory distress. No wheezes  GI: Soft. Bowel sounds are normal. She exhibits no distension. Non-distended Neurological: She is alert. Follows direction   RUE  4+/5. RLE also  4+/5. Improving volitional control and coordination. Has LT sensation on the right. No evidence of field cut with confrontation testing.   Has difficulties with expressive language at times with apraxia, uses paraphrases at times- , decreased fine motor  Skin: Skin is warm and dry. No perineal rash Psychiatric:  not anxious. Very pleasant.   Assessment/Plan: 1. Functional deficits secondary to Thrombotic left MCA infarct which require 3+ hours per day of interdisciplinary therapy in a comprehensive inpatient rehab setting. Physiatrist is providing close team  supervision and 24 hour management of active medical problems listed below. Physiatrist and rehab team continue to assess barriers to discharge/monitor patient progress toward functional and medical goals.   FIM: FIM - Bathing Bathing Steps Patient Completed: Chest;Right Arm;Left Arm;Abdomen;Front perineal area;Buttocks;Right upper leg;Left upper leg;Right lower leg (including foot);Left lower leg (including foot) Bathing: 5: Supervision: Safety issues/verbal cues  FIM - Upper Body Dressing/Undressing Upper body dressing/undressing steps patient completed: Thread/unthread right bra strap;Thread/unthread left bra strap;Thread/unthread right sleeve of pullover shirt/dresss;Thread/unthread left sleeve of pullover shirt/dress;Put head through opening of pull over shirt/dress;Pull shirt over trunk;Hook/unhook bra Upper body dressing/undressing: 5: Supervision: Safety issues/verbal cues FIM - Lower Body Dressing/Undressing Lower body dressing/undressing steps patient completed: Thread/unthread right underwear leg;Don/Doff right sock;Don/Doff left sock;Thread/unthread left underwear leg;Pull underwear up/down;Don/Doff right shoe;Don/Doff left shoe;Thread/unthread right pants leg;Thread/unthread left pants leg;Pull pants up/down;Fasten/unfasten pants;Fasten/unfasten right shoe;Fasten/unfasten left shoe Lower body dressing/undressing: 5: Supervision: Safety issues/verbal cues  FIM - Toileting Toileting steps completed by patient: Adjust clothing prior to toileting;Performs perineal hygiene;Adjust clothing after toileting Toileting Assistive Devices: Grab bar or rail for support Toileting: 5: Supervision: Safety issues/verbal cues  FIM - Radio producer Devices: Grab bars Toilet Transfers: 5-Set-up assist to: Apply orthosis/W/C setup  FIM - Control and instrumentation engineer Devices: Arm rests Bed/Chair Transfer: 5: Bed > Chair or W/C: Supervision (verbal  cues/safety issues);5: Chair or W/C > Bed: Supervision (verbal cues/safety issues)  FIM - Locomotion: Wheelchair Locomotion: Wheelchair: 0: Activity did not occur FIM - Locomotion: Ambulation Locomotion: Ambulation Assistive Devices: Other (comment) (none) Ambulation/Gait Assistance: 5: Supervision Locomotion: Ambulation:  5: Travels 150 ft or more with supervision/safety issues  Comprehension Comprehension Mode: Auditory Comprehension: 4-Understands basic 75 - 89% of the time/requires cueing 10 - 24% of the time  Expression Expression Mode: Verbal Expression: 3-Expresses basic 50 - 74% of the time/requires cueing 25 - 50% of the time. Needs to repeat parts of sentences.  Social Interaction Social Interaction: 6-Interacts appropriately with others with medication or extra time (anti-anxiety, antidepressant).  Problem Solving Problem Solving: 4-Solves basic 75 - 89% of the time/requires cueing 10 - 24% of the time  Memory Memory: 4-Recognizes or recalls 75 - 89% of the time/requires cueing 10 - 24% of the time  Medical Problem List and Plan:  1. 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 DVT Prophylaxis/Anticoagulation: Subcutaneous Lovenox.   3. Pain Management: Tylenol as needed.  4. Neuropsych: This patient is not capable of making decisions on her own behalf.  5. Dysphagia/aphasia. Followup speech therapy. Currently maintained on mechanical soft diet and eating well 6. Hypertension. Aldactone 12.5 mg daily. Monitor with increased activity  7. Acute sinusitis. Augmentin has been completed  8. Hyperlipidemia. Lipitor  9. GERD. Protonix  10. Asthma. Continue MDI's. No problems at present. Chest clear 11.  Urinary frequency- CVA related  12.  ?hematochezia, no further c/os LOS (Days) 14 A FACE TO FACE EVALUATION WAS PERFORMED  KIRSTEINS,ANDREW E 12/09/2013 6:50 AM

## 2013-12-10 MED ORDER — OXYBUTYNIN CHLORIDE 5 MG PO TABS: 5.0000 mg | ORAL_TABLET | Freq: Every day | ORAL | Status: DC

## 2013-12-10 MED ORDER — BUDESONIDE-FORMOTEROL FUMARATE 80-4.5 MCG/ACT IN AERO: INHALATION_SPRAY | RESPIRATORY_TRACT | Status: DC

## 2013-12-10 MED ORDER — ALBUTEROL SULFATE HFA 108 (90 BASE) MCG/ACT IN AERS: 2.0000 | INHALATION_SPRAY | Freq: Four times a day (QID) | RESPIRATORY_TRACT | Status: DC | PRN

## 2013-12-10 MED ORDER — ATORVASTATIN CALCIUM 20 MG PO TABS: 20.0000 mg | ORAL_TABLET | Freq: Every day | ORAL | Status: DC

## 2013-12-10 MED ORDER — OMEPRAZOLE 20 MG PO CPDR: 20.0000 mg | DELAYED_RELEASE_CAPSULE | ORAL | Status: DC

## 2013-12-10 MED ORDER — MONTELUKAST SODIUM 10 MG PO TABS: 10.0000 mg | ORAL_TABLET | Freq: Every day | ORAL | Status: DC

## 2013-12-10 MED ORDER — FLUTICASONE PROPIONATE 50 MCG/ACT NA SUSP: 2.0000 | Freq: Two times a day (BID) | NASAL | Status: DC

## 2013-12-10 MED ORDER — SPIRONOLACTONE 12.5 MG HALF TABLET: 12.5000 mg | ORAL_TABLET | Freq: Every day | ORAL | Status: DC

## 2013-12-10 MED ORDER — ASPIRIN 325 MG PO TBEC: 325.0000 mg | DELAYED_RELEASE_TABLET | Freq: Every day | ORAL | Status: DC

## 2013-12-10 NOTE — Discharge Instructions (Signed)
Inpatient Rehab Discharge Instructions  Andrea Mason Discharge date and time: No discharge date for patient encounter.   Activities/Precautions/ Functional Status: Activity: activity as tolerated Diet: Soft Wound Care: none needed Functional status:  ___ No restrictions     ___ Walk up steps independently ___ 24/7 supervision/assistance   ___ Walk up steps with assistance ___ Intermittent supervision/assistance  ___ Bathe/dress independently ___ Walk with walker     ___ Bathe/dress with assistance ___ Walk Independently    ___ Shower independently _x__ Walk with assistance    ___ Shower with assistance ___ No alcohol     ___ Return to work/school ________  COMMUNITY REFERRALS UPON DISCHARGE:    Home Health:   PT     OT     ST     RN  Agency:  [facility] Phone:  [phone]  Medical Equipment/Items Ordered:  None - will use mother's tub bench   GENERAL COMMUNITY RESOURCES FOR PATIENT/FAMILY: Support Groups: [employer] Stroke Support Group  [facility] application completed - They will call you to set up intake interview  Special Instructions:    My questions have been answered and I understand these instructions. I will adhere to these goals and the provided educational materials after my discharge from the hospital.  Patient/Caregiver Signature _______________________________ Date __________  Clinician Signature _______________________________________ Date __________  Please bring this form and your medication list with you to all your follow-up doctor's appointments.  STROKE/TIA DISCHARGE INSTRUCTIONS SMOKING Cigarette smoking nearly doubles your risk of having a stroke & is the single most alterable risk factor  If you smoke or have smoked in the last 12 months, you are advised to quit smoking for your health.  Most of the excess cardiovascular risk related to smoking disappears within a year of stopping.  Ask you doctor about anti-smoking medications  [place] Quit  Line: 1-800-QUIT NOW  Free Smoking Cessation Classes [phone]  CHOLESTEROL Know your levels; limit fat & cholesterol in your diet  Lipid Panel     Component Value Date/Time   CHOL 80 11/22/2013 0545   TRIG 53 11/22/2013 0545   HDL 46 11/22/2013 0545   CHOLHDL 1.7 11/22/2013 0545   VLDL 11 11/22/2013 0545   LDLCALC 23 11/22/2013 0545      Many patients benefit from treatment even if their cholesterol is at goal.  Goal: Total Cholesterol (CHOL) less than 160  Goal:  Triglycerides (TRIG) less than 150  Goal:  HDL greater than 40  Goal:  LDL (LDLCALC) less than 100   BLOOD PRESSURE American Stroke Association blood pressure target is less that 120/80 mm/Hg  Your discharge blood pressure is:  BP: 134/87 mmHg  Monitor your blood pressure  Limit your salt and alcohol intake  Many individuals will require more than one medication for high blood pressure  DIABETES (A1c is a blood sugar average for last 3 months) Goal HGBA1c is under 7% (HBGA1c is blood sugar average for last 3 months)  Diabetes: No known diagnosis of diabetes    Lab Results  Component Value Date   HGBA1C 6.1* 11/22/2013     Your HGBA1c can be lowered with medications, healthy diet, and exercise.  Check your blood sugar as directed by your physician  Call your physician if you experience unexplained or low blood sugars.  PHYSICAL ACTIVITY/REHABILITATION Goal is 30 minutes at least 4 days per week  Activity: No driving, Therapies: Physical Therapy: Home Health Return to work:   Activity decreases your risk  of heart attack and stroke and makes your heart stronger.  It helps control your weight and blood pressure; helps you relax and can improve your mood.  Participate in a regular exercise program.  Talk with your doctor about the best form of exercise for you (dancing, walking, swimming, cycling).  DIET/WEIGHT Goal is to maintain a healthy weight  Your discharge diet is: Dysphagia  liquids Your  height is:  Height: 5\' 3"  (160 cm) Your current weight is: Weight: 97.705 kg (215 lb 6.4 oz) Your Body Mass Index (BMI) is:  BMI (Calculated): 38.2  Following the type of diet specifically designed for you will help prevent another stroke.  Your goal weight range is:    Your goal Body Mass Index (BMI) is 19-24.  Healthy food habits can help reduce 3 risk factors for stroke:  High cholesterol, hypertension, and excess weight.  RESOURCES Stroke/Support Group:  Call [phone]   STROKE EDUCATION PROVIDED/REVIEWED AND GIVEN TO PATIENT Stroke warning signs and symptoms How to activate emergency medical system (call 911). Medications prescribed at discharge. Need for follow-up after discharge. Personal risk factors for stroke. Pneumonia vaccine given: No Flu vaccine given: No My questions have been answered, the writing is legible, and I understand these instructions.  I will adhere to these goals & educational materials that have been provided to me after my discharge from the hospital.

## 2013-12-10 NOTE — Progress Notes (Addendum)
Subjective/Complaints: No c/os excited about D/C Aphasic with word substitution A 12 point review of systems has been performed and if not noted above is otherwise negative.   Objective: Vital Signs: Blood pressure 129/76, pulse 77, temperature 97.6 F (36.4 C), temperature source Oral, resp. rate 16, height 5\' 3"  (1.6 m), weight 97.705 kg (215 lb 6.4 oz), SpO2 97.00%. No results found. No results found for this basename: WBC, HGB, HCT, PLT,  in the last 72 hours  Recent Labs  12/09/13 0440  CREATININE 0.81   CBG (last 3)  No results found for this basename: GLUCAP,  in the last 72 hours  Wt Readings from Last 3 Encounters:  12/03/13 97.705 kg (215 lb 6.4 oz)  11/20/13 96.6 kg (212 lb 15.4 oz)  11/14/13 99.338 kg (219 lb)    Physical Exam:  Vitals reviewed.  General: pleasant and NAD. A little fatigued appearing  HENT: oral mucosa pink and moist. Dentition is fair  Head: Normocephalic.  Eyes: EOM are normal.  Neck: Normal range of motion. Neck supple. No thyromegaly present.  Cardiovascular: Normal rate and regular rhythm. No murmurs, rubs, gallops  Respiratory: Effort normal and breath sounds normal. No respiratory distress. No wheezes  GI: Soft. Bowel sounds are normal. She exhibits no distension. Non-distended Neurological: She is alert. Follows direction   RUE  4+/5. RLE also  4+/5. Improving volitional control and coordination. Has LT sensation on the right. No evidence of field cut with confrontation testing.   Has difficulties with expressive language at times with apraxia, uses paraphrases at times- , decreased fine motor  Skin: Skin is warm and dry. No perineal rash Psychiatric:  not anxious. Very pleasant.   Assessment/Plan: 1. Functional deficits secondary to Thrombotic left MCA infarct Stable for D/C today F/u PCP in 1-2 weeks F/u PM&R 3 weeks See D/C summary See D/C instructions  FIM: FIM - Bathing Bathing Steps Patient Completed: Chest;Right  Arm;Left Arm;Abdomen;Front perineal area;Buttocks;Right upper leg;Left upper leg;Right lower leg (including foot);Left lower leg (including foot) Bathing: 5: Supervision: Safety issues/verbal cues  FIM - Upper Body Dressing/Undressing Upper body dressing/undressing steps patient completed: Thread/unthread right bra strap;Thread/unthread left bra strap;Thread/unthread right sleeve of pullover shirt/dresss;Thread/unthread left sleeve of pullover shirt/dress;Put head through opening of pull over shirt/dress;Pull shirt over trunk;Hook/unhook bra Upper body dressing/undressing: 5: Supervision: Safety issues/verbal cues FIM - Lower Body Dressing/Undressing Lower body dressing/undressing steps patient completed: Thread/unthread right underwear leg;Don/Doff right sock;Don/Doff left sock;Thread/unthread left underwear leg;Pull underwear up/down;Don/Doff right shoe;Don/Doff left shoe;Thread/unthread right pants leg;Thread/unthread left pants leg;Pull pants up/down;Fasten/unfasten pants;Fasten/unfasten right shoe;Fasten/unfasten left shoe Lower body dressing/undressing: 5: Supervision: Safety issues/verbal cues  FIM - Toileting Toileting steps completed by patient: Adjust clothing prior to toileting;Performs perineal hygiene;Adjust clothing after toileting Toileting Assistive Devices: Grab bar or rail for support Toileting: 5: Supervision: Safety issues/verbal cues  FIM - Radio producer Devices: Grab bars Toilet Transfers: 5-From toilet/BSC: Supervision (verbal cues/safety issues);5-To toilet/BSC: Supervision (verbal cues/safety issues)  FIM - Control and instrumentation engineer Devices: Arm rests Bed/Chair Transfer: 5: Bed > Chair or W/C: Supervision (verbal cues/safety issues);5: Chair or W/C > Bed: Supervision (verbal cues/safety issues);6: Supine > Sit: No assist;6: Sit > Supine: No assist  FIM - Locomotion: Wheelchair Distance: 150 Locomotion: Wheelchair:  5: Travels 150 ft or more: maneuvers on rugs and over door sills with supervision, cueing or coaxing FIM - Locomotion: Ambulation Locomotion: Ambulation Assistive Devices: Other (comment) (none) Ambulation/Gait Assistance: 5: Supervision Locomotion: Ambulation: 5: Travels 150 ft or  more with supervision/safety issues  Comprehension Comprehension Mode: Auditory Comprehension: 4-Understands basic 75 - 89% of the time/requires cueing 10 - 24% of the time  Expression Expression Mode: Verbal Expression: 3-Expresses basic 50 - 74% of the time/requires cueing 25 - 50% of the time. Needs to repeat parts of sentences.  Social Interaction Social Interaction: 6-Interacts appropriately with others with medication or extra time (anti-anxiety, antidepressant).  Problem Solving Problem Solving: 4-Solves basic 75 - 89% of the time/requires cueing 10 - 24% of the time  Memory Memory: 4-Recognizes or recalls 75 - 89% of the time/requires cueing 10 - 24% of the time  Medical Problem List and Plan:  1. 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 DVT Prophylaxis/Anticoagulation: Subcutaneous Lovenox.   3. Pain Management: Tylenol as needed.  4. Neuropsych: This patient is not capable of making decisions on her own behalf.  5. Dysphagia/aphasia. Followup speech therapy. Currently maintained on mechanical soft diet and eating well 6. Hypertension. Aldactone 12.5 mg daily. Monitor with increased activity  7. Acute sinusitis. Augmentin has been completed  8. Hyperlipidemia. Lipitor  9. GERD. Protonix  10. Asthma. Continue MDI's. No problems at present. Chest clear 11.  Urinary frequency- CVA related   LOS (Days) 15 A FACE TO FACE EVALUATION WAS PERFORMED  Andrea Mason 12/10/2013 8:35 AM

## 2013-12-10 NOTE — Discharge Summary (Addendum)
[name], [name]              ACCOUNT NO.:  [id number]  MEDICAL RECORD NO.:  [id number]  LOCATION:  4M03C                        FACILITY:  [facility]  PHYSICIAN:  Charlett Blake, M.D.DATE OF BIRTH:  1951/06/09  DATE OF ADMISSION:  11/25/2013 DATE OF DISCHARGE:  12/10/2013                              DISCHARGE SUMMARY   DISCHARGE DIAGNOSES: 1. Embolic left middle cerebral artery infarction. 2. Subcutaneous Lovenox for deep venous thrombosis prophylaxis. 3. Dysphagia. 4. Hypertension. 5. Acute sinusitis, resolved. 6. Hyperlipidemia. 7. Gastroesophageal reflux disease. 8. Asthma.  This is a 63 year old right-handed female, history of hypertension and asthma who was admitted on November 20, 2013, with altered mental status, questionable syncopal episode, and right-sided weakness with aphasia.  MRI of the brain showed acute left MCA infarction as well as small area of acute infarct head of the caudate on left.  MRA of the head with high-grade stenosis or likely occlusion of the inferior posterior left M2 branch corresponding to the area of acute infarction. Echocardiogram with ejection fraction 55%.  Normal systolic function. Carotid Dopplers negative for ICA stenosis.  EEG with no seizure activity noted.  Neurology Service was consulted.  The patient did not receive tPA.  Aspirin added for CVA prophylaxis.  Subcutaneous Lovenox for DVT prophylaxis.  Placed on Augmentin for questionable sinusitis has since been completed.  Physical and occupational therapy ongoing.  The patient was admitted for comprehensive rehab program.  PAST MEDICAL HISTORY:  See discharge diagnoses.  SOCIAL HISTORY:  Lives with family 1-level home.  FUNCTIONAL HISTORY PRIOR TO ADMISSION:  Independent working part time.  FUNCTIONAL STATUS UPON ADMISSION:  To rehab services, +2 total assist, ambulate 40 feet with a rolling walker.  PHYSICAL EXAMINATION:  VITAL SIGNS:  Blood pressure 159/96, pulse  102, temperature 97, respirations 20. GENERAL:  This was an alert female, limited awareness of her deficits. She was able to state her name but easily distracted.  Pupils round and reactive to light. LUNGS:  Clear to auscultation. CARDIAC:  Regular rate and rhythm.  The patient did have some perseveration and perseverates on given tasks or movement.  REHABILITATION HOSPITAL COURSE:  The patient was admitted to inpatient rehab services with therapies initiated on a 3-hour daily basis.  The following issues were addressed during the patient's rehabilitation stay.  Pertaining to [name]'s embolic left MCA infarction remained stable, maintained on aspirin therapy.  Subcutaneous Lovenox had been initiated during her hospital course for DVT prophylaxis.  Her diet was slowly advanced to a mechanical soft, which she tolerated nicely.  Blood pressure is well-controlled on Aldactazide and she would follow up with her primary MD, Dr. Cathlean Mason.  She had completed a course of Augmentin for acute sinusitis.  She remained on Lipitor for hyperlipidemia. Protonix ongoing for gastroesophageal reflux disease.  She did have a history of asthma, maintained on her inhalers.  No complaints of shortness of breath.  The patient received weekly collaborative interdisciplinary team conferences to discuss estimated length of stay, family teaching, and any barriers to her discharge.  She was moderate assist for basic transfers, moderate assist for ambulation 30 feet, needing some cues for safety, min assist bathing, upper and lower body  dressing, total assist for lower body dressing, max assist for toileting.  Full family teaching was completed and plan was to be discharged to home with ongoing therapies dictated per rehab services.  DISCHARGE MEDICATIONS: 1. Aspirin 325 mg p.o. daily. 2. Albuterol inhaler 2 puffs every 6 hours as needed. 3. Lipitor 20 mg p.o. daily. 4. Symbicort 80-4.5 mcg 2 puffs twice  daily. 5. Flonase 2 sprays each nostril twice daily. 6. Protonix 40 mg p.o. daily. 7. Aldactone 12.5 mg p.o. daily. 8. Singulair 10 mg daily DIET:  Mechanical soft, thin liquids.  SPECIAL INSTRUCTIONS:  The patient would follow up with Dr. Alysia Mason at the outpatient rehab center on January 06, 2014; Dr. Antony Mason, Neurology Services in 1 month call for appointment; Dr. Cathlean Mason, medical management appointment to be made.  Ongoing therapies had been arranged as per rehab services.     Andrea Mason, P.A.   ______________________________ Charlett Blake, M.D.    DA/MEDQ  D:  12/09/2013  T:  12/10/2013  Job:  [id number]  cc:   Charlett Blake, M.D. Pramod P. Leonie Man, MD Biagio Borg, MD

## 2013-12-10 NOTE — Progress Notes (Signed)
Patient discharged home.  Patient verbalized understanding of discharge instructions, no question asked.  All patient belongings taken with patient.  Left floor ambulatory, escorted by nursing staff and family.  VSS.  Appears to be in no immediate distress at this time.  Brita Romp, RN

## 2013-12-11 NOTE — Progress Notes (Signed)
Social Work Discharge Note  The overall goal for the admission was met for:   Discharge location: Yes - home  Length of Stay: Yes - 15 days  Discharge activity level: Yes - Supervision  Home/community participation: Yes  Services provided included: MD, RD, PT, OT, SLP, RN, TR, Pharmacy, Neuropsych and SW  Financial Services: Private Insurance: [employer]  Follow-up services arranged: Home Health: PT, OT, ST and Patient/Family request agency HH: [facility], DME: none needed  Comments (or additional information):  Patient/Family verbalized understanding of follow-up arrangements: Yes  Individual responsible for coordination of the follow-up plan: patient, pt's mother, and pt's dtr-in-law  Confirmed correct DME delivered: Trey Sailors 12/11/2013    Icey Tello, Silvestre Mesi

## 2013-12-12 ENCOUNTER — Encounter: Payer: BC Managed Care – PPO | Admitting: Adult Health

## 2013-12-16 ENCOUNTER — Ambulatory Visit: Payer: BC Managed Care – PPO | Admitting: Internal Medicine

## 2013-12-18 ENCOUNTER — Ambulatory Visit (INDEPENDENT_AMBULATORY_CARE_PROVIDER_SITE_OTHER): Payer: Commercial Managed Care - PPO | Admitting: Internal Medicine

## 2013-12-18 ENCOUNTER — Encounter: Payer: Self-pay | Admitting: Internal Medicine

## 2013-12-18 VITALS — BP 150/90 | HR 75 | Temp 97.4°F | Wt 219.0 lb

## 2013-12-18 DIAGNOSIS — E785 Hyperlipidemia, unspecified: Secondary | ICD-10-CM

## 2013-12-18 DIAGNOSIS — R739 Hyperglycemia, unspecified: Secondary | ICD-10-CM

## 2013-12-18 DIAGNOSIS — I6350 Cerebral infarction due to unspecified occlusion or stenosis of unspecified cerebral artery: Secondary | ICD-10-CM

## 2013-12-18 DIAGNOSIS — I10 Essential (primary) hypertension: Secondary | ICD-10-CM

## 2013-12-18 DIAGNOSIS — R7309 Other abnormal glucose: Secondary | ICD-10-CM

## 2013-12-18 DIAGNOSIS — I639 Cerebral infarction, unspecified: Secondary | ICD-10-CM

## 2013-12-18 MED ORDER — AMLODIPINE BESYLATE 2.5 MG PO TABS: 2.5000 mg | ORAL_TABLET | Freq: Every day | ORAL | Status: DC

## 2013-12-18 NOTE — Progress Notes (Signed)
Subjective:    Patient ID: Andrea Mason, female    DOB: July 13, 1951, 63 y.o.   MRN: [id number]  HPI  Here to f/u recent acute cva; Pt denies chest pain, increased sob or doe, wheezing, orthopnea, PND, increased LE swelling, palpitations, dizziness or syncope.  Pt denies new neurological symptoms such as new headache, or facial or extremity weakness or numbness   Pt denies polydipsia, polyuria,   Pt states overall good compliance with meds, trying to follow lower cholesterol diet, wt overall stable.  Pt denies fever, wt loss, night sweats, loss of appetite, or other constitutional symptoms  Per EMR, pt suspected having hyperaldosteronism Past Medical History  Diagnosis Date  . Hypertension   . GERD (gastroesophageal reflux disease)   . Allergic rhinitis   . Asthma   . Obesity   . Depression   . History of colonic polyps   . DJD (degenerative joint disease), cervical   . 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 Nasal sinus surgery  07/2006    Dr. [name]  . Nasal polyp surgery  07/2006    x 2 with Dr. [name]  . Foot surgery  1998  . Tubal ligation      reports that she has never smoked. She has never used smokeless tobacco. She reports that she does not drink alcohol or use illicit drugs. family history includes Colon cancer in her brother. Allergies  Allergen Reactions  . Ivp Dye [Iodinated Diagnostic Agents] Nausea And Vomiting    Reaction: hot flashes  . Promethazine-Codeine Nausea And Vomiting   Current Outpatient Prescriptions on File Prior to Visit  Medication Sig Dispense Refill  . albuterol (PROAIR HFA) 108 (90 BASE) MCG/ACT inhaler Inhale 2 puffs into the lungs every 6 (six) hours as needed for wheezing. 2 puffs every 4 hours as needed only  if your can't catch your breath  1 Inhaler  2  . aspirin 325 MG EC tablet Take 1 tablet (325 mg total) by mouth daily.  30 tablet  0  . atorvastatin (LIPITOR) 20 MG tablet Take 1  tablet (20 mg total) by mouth daily at 6 PM.  30 tablet  1  . Biotin 5000 MCG CAPS Take 1 capsule by mouth daily.      . budesonide-formoterol (SYMBICORT) 80-4.5 MCG/ACT inhaler Take 2 puffs first thing in am and then another 2 puffs about 12 hours later.  1 Inhaler  12  . cetirizine (ZYRTEC) 10 MG tablet Take 10 mg by mouth at bedtime.       . fluticasone (FLONASE) 50 MCG/ACT nasal spray Place 2 sprays into both nostrils 2 (two) times daily.  16 g  11  . montelukast (SINGULAIR) 10 MG tablet Take 1 tablet (10 mg total) by mouth at bedtime.  30 tablet  11  . omeprazole (PRILOSEC) 20 MG capsule Take 1 capsule (20 mg total) by mouth every morning.  30 capsule  11  . oxybutynin (DITROPAN) 5 MG tablet Take 1 tablet (5 mg total) by mouth at bedtime.  30 tablet  1  . spironolactone (ALDACTONE) 12.5 mg TABS tablet Take 0.5 tablets (12.5 mg total) by mouth daily.  30 tablet  1  . zinc gluconate 50 MG tablet Take 50 mg by mouth daily.       No current facility-administered medications on file prior to visit.    Review of Systems  Constitutional: Negative for unexpected weight change, or unusual diaphoresis  HENT: Negative  for tinnitus.   Eyes: Negative for photophobia and visual disturbance.  Respiratory: Negative for choking and stridor.   Gastrointestinal: Negative for vomiting and blood in stool.  Genitourinary: Negative for hematuria and decreased urine volume.  Musculoskeletal: Negative for acute joint swelling Skin: Negative for color change and wound.  Neurological: Negative for tremors and numbness other than noted  Psychiatric/Behavioral: Negative for decreased concentration or  hyperactivity.       Objective:   Physical Exam BP 150/90  Pulse 75  Temp(Src) 97.4 F (36.3 C) (Oral)  Wt 219 lb (99.338 kg)  SpO2 95% VS noted,  Constitutional: Pt appears well-developed and well-nourished.  HENT: Head: NCAT.  Right Ear: External ear normal.  Left Ear: External ear normal.  Eyes:  Conjunctivae and EOM are normal. Pupils are equal, round, and reactive to light.  Neck: Normal range of motion. Neck supple.  Cardiovascular: Normal rate and regular rhythm.   Pulmonary/Chest: Effort normal and breath sounds normal.  Abd:  Soft, NT, non-distended, + BS Neurological: Pt is alert. Not confused  Skin: Skin is warm. No erythema.  Psychiatric: Pt behavior is normal. Thought content normal.     Assessment & Plan:

## 2013-12-18 NOTE — Patient Instructions (Signed)
Please take all new medication as prescribed Please continue all other medications as before, and refills have been done if requested. Please have the pharmacy call with any other refills you may need.  Please remember to sign up for My Chart if you have not done so, as this will be important to you in the future with finding out test results, communicating by private email, and scheduling acute appointments online when needed.  Please keep your appointments with your specialists as you have planned - Dr Leonie Man (neurology)  Please call the kidney doctor [facility]) to see if you need to make a return visit  Please return in 3 months, or sooner if needed

## 2013-12-18 NOTE — Progress Notes (Signed)
Pre-visit discussion using our clinic review tool. No additional management support is needed unless otherwise documented below in the visit note.  

## 2013-12-19 ENCOUNTER — Telehealth: Payer: Self-pay | Admitting: Internal Medicine

## 2013-12-19 NOTE — Telephone Encounter (Signed)
Relevant patient education assigned to patient using Emmi. ° °

## 2013-12-20 NOTE — Assessment & Plan Note (Signed)
stable overall by history and exam, recent data reviewed with pt, and pt to continue medical treatment as before,  to f/u any worsening symptoms or concerns Lab Results  Component Value Date   LDLCALC 23 11/22/2013

## 2013-12-20 NOTE — Assessment & Plan Note (Signed)
For neuro f/u as planned

## 2013-12-20 NOTE — Assessment & Plan Note (Signed)
stable overall by history and exam, recent data reviewed with pt, and pt to continue medical treatment as before,  to f/u any worsening symptoms or concerns Lab Results  Component Value Date   HGBA1C 6.1* 11/22/2013

## 2013-12-20 NOTE — Assessment & Plan Note (Signed)
Uncontrolled, Add amlod 2.5 qd,  to f/u any worsening symptoms or concerns

## 2013-12-26 ENCOUNTER — Telehealth: Payer: Self-pay | Admitting: Internal Medicine

## 2013-12-26 DIAGNOSIS — I69998 Other sequelae following unspecified cerebrovascular disease: Secondary | ICD-10-CM | POA: Diagnosis not present

## 2013-12-26 DIAGNOSIS — Z5189 Encounter for other specified aftercare: Secondary | ICD-10-CM | POA: Diagnosis not present

## 2013-12-26 DIAGNOSIS — M6281 Muscle weakness (generalized): Secondary | ICD-10-CM | POA: Diagnosis not present

## 2013-12-26 DIAGNOSIS — R269 Unspecified abnormalities of gait and mobility: Secondary | ICD-10-CM | POA: Diagnosis not present

## 2013-12-26 MED ORDER — AMLODIPINE BESYLATE 5 MG PO TABS: 5.0000 mg | ORAL_TABLET | Freq: Every day | ORAL | Status: DC

## 2013-12-26 NOTE — Telephone Encounter (Signed)
Pt's home health therapists told her her BP is still too high.  She had a stroke in Dec.  Yesterday her readings were 141 / 105 and 132 / 88.

## 2013-12-26 NOTE — Telephone Encounter (Signed)
Patient informed medication has been increased and sent in to her pharmacy.

## 2013-12-26 NOTE — Telephone Encounter (Signed)
Ok to change from 2.5 to 5 mg amlodipine  - done erx

## 2014-01-06 ENCOUNTER — Encounter: Payer: Commercial Managed Care - PPO | Attending: Physical Medicine & Rehabilitation

## 2014-01-06 ENCOUNTER — Ambulatory Visit (HOSPITAL_BASED_OUTPATIENT_CLINIC_OR_DEPARTMENT_OTHER): Payer: 59 | Admitting: Physical Medicine & Rehabilitation

## 2014-01-06 ENCOUNTER — Encounter: Payer: Self-pay | Admitting: Physical Medicine & Rehabilitation

## 2014-01-06 VITALS — BP 138/83 | HR 76 | Resp 14 | Ht 61.75 in | Wt 210.6 lb

## 2014-01-06 DIAGNOSIS — I69920 Aphasia following unspecified cerebrovascular disease: Secondary | ICD-10-CM

## 2014-01-06 DIAGNOSIS — F3289 Other specified depressive episodes: Secondary | ICD-10-CM | POA: Insufficient documentation

## 2014-01-06 DIAGNOSIS — Z860100 Personal history of colon polyps, unspecified: Secondary | ICD-10-CM | POA: Insufficient documentation

## 2014-01-06 DIAGNOSIS — I69320 Aphasia following cerebral infarction: Secondary | ICD-10-CM

## 2014-01-06 DIAGNOSIS — G8110 Spastic hemiplegia affecting unspecified side: Secondary | ICD-10-CM

## 2014-01-06 DIAGNOSIS — K219 Gastro-esophageal reflux disease without esophagitis: Secondary | ICD-10-CM | POA: Insufficient documentation

## 2014-01-06 DIAGNOSIS — I69959 Hemiplegia and hemiparesis following unspecified cerebrovascular disease affecting unspecified side: Secondary | ICD-10-CM | POA: Insufficient documentation

## 2014-01-06 DIAGNOSIS — F329 Major depressive disorder, single episode, unspecified: Secondary | ICD-10-CM | POA: Insufficient documentation

## 2014-01-06 DIAGNOSIS — I10 Essential (primary) hypertension: Secondary | ICD-10-CM | POA: Insufficient documentation

## 2014-01-06 DIAGNOSIS — E669 Obesity, unspecified: Secondary | ICD-10-CM | POA: Insufficient documentation

## 2014-01-06 DIAGNOSIS — J45909 Unspecified asthma, uncomplicated: Secondary | ICD-10-CM | POA: Insufficient documentation

## 2014-01-06 NOTE — Progress Notes (Signed)
Subjective:    Patient ID: Andrea Mason, female    DOB: 01/03/1951, 63 y.o.   MRN: [id number] DATE OF ADMISSION: 11/25/2013  DATE OF DISCHARGE: 12/10/2013  HPI CC Left MCA infarct:  [facility] 63 year old right-handed female, history of hypertension and  asthma who was admitted on November 20, 2013, with altered mental  status, questionable syncopal episode, and right-sided weakness with  aphasia. MRI of the brain showed acute left MCA infarction as well as  small area of acute infarct head of the caudate on left.  Her rehabilitation, patient went first to her mother's home for a couple weeks and then for the last 2 weeks has been at her own home  Done with [facility] PT<OT, Patient still with speech problems Continues to have problems with the right hand. He is able to dress and bathe herself. Also having problems with walking. Not using cane or walker anymore No Falls Stays by herself Pain Inventory Average Pain 0 Pain Right Now 0 My pain is intermittent, burning and tingling  In the last 24 hours, has pain interfered with the following? General activity 5 Relation with others 5 Enjoyment of life 5 What TIME of day is your pain at its worst? varies Sleep (in general) Poor  Pain is worse with: some activites Pain improves with: rest Relief from Meds: no meds  Mobility walk without assistance ability to climb steps?  yes do you drive?  no  Function disabled: date disabled 11/19/13 I need assistance with the following:  household duties and shopping  Neuro/Psych weakness numbness tingling loss of taste or smell  Prior Studies Any changes since last visit?  no  Physicians involved in your care Any changes since last visit?  no   Family History  Problem Relation Age of Onset  . Colon cancer Brother    History   Social History  . Marital Status: Widowed    Spouse Name: N/A    Number of Children: 3  . Years of Education: N/A   Occupational History   . cosmetic consultant     [employer]   Social History Main Topics  . Smoking status: Never Smoker   . Smokeless tobacco: Never Used  . Alcohol Use: No  . Drug Use: No  . Sexual Activity: Not Currently   Other Topics Concern  . 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   Past Surgical History  Procedure Laterality Date  . Nasal sinus surgery  07/2006    Dr. [name]  . Nasal polyp surgery  07/2006    x 2 with Dr. [name]  . Foot surgery  1998  . Tubal ligation     Past Medical History  Diagnosis Date  . Hypertension   . GERD (gastroesophageal reflux disease)   . Allergic rhinitis   . Asthma   . Obesity   . Depression   . History of colonic polyps   . DJD (degenerative joint disease), cervical   . 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 Stroke    BP 138/83  Pulse 76  Resp 14  Ht 5' 1.75" (1.568 m)  Wt 210 lb 9.6 oz (95.528 kg)  BMI 38.85 kg/m2  SpO2 100%  Opioid Risk Score:   Fall Risk Score:  Moderate Fall Risk (6-13 points) (educated and fall prevention handout given)  Review of Systems  Constitutional:       Loss of taste or smell  Neurological: Positive for weakness and numbness.       Tingling  All other systems reviewed and are negative.       Objective:   Physical Exam  Decreased sensation on the palm of the right hand. Motor strength is 4/5 in the right upper and right lower and 5/5 in the left upper and left lower Visual fields are intact to confrontation testing Tone is normal. Speech has word finding difficulties, dysarthria      Assessment & Plan:  1.  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 Patient has completed inpatient therapy as well as home health therapy and we will make a referral for outpatient therapy. Also  recommend followup with neurologist Feel the patient will be qualify for disability

## 2014-01-06 NOTE — Patient Instructions (Signed)
Outpatient therapy will call you with appt time and date

## 2014-01-07 ENCOUNTER — Telehealth: Payer: Self-pay | Admitting: Internal Medicine

## 2014-01-07 ENCOUNTER — Telehealth: Payer: Self-pay | Admitting: *Deleted

## 2014-01-07 NOTE — Telephone Encounter (Signed)
Andrea Mason.Phung Kotas A  

## 2014-01-07 NOTE — Telephone Encounter (Signed)
Pt had gotten mucinex max strength and made her feel strange and lite headed.  Has since stopped taking and is drinking a lot of water and symptoms have left. Advised pt to pay attention and see if symptoms come back, if they do call office or go to ER. Nothing further is needed

## 2014-01-07 NOTE — Telephone Encounter (Signed)
[name]  

## 2014-01-07 NOTE — Telephone Encounter (Signed)
Andrea Mason A  

## 2014-01-07 NOTE — Telephone Encounter (Signed)
Error.Mahogony Gilchrest A  

## 2014-01-07 NOTE — Telephone Encounter (Signed)
[name]  

## 2014-01-08 ENCOUNTER — Telehealth: Payer: Self-pay | Admitting: Internal Medicine

## 2014-01-08 MED ORDER — FLUCONAZOLE 150 MG PO TABS: 150.0000 mg | ORAL_TABLET | Freq: Every day | ORAL | Status: DC

## 2014-01-08 NOTE — Telephone Encounter (Signed)
Called and lmom to make pt aware of CY recs.  i have sent the medication into the pharmacy.  Pt to call back for any questions or concerns.

## 2014-01-08 NOTE — Telephone Encounter (Signed)
Sounds like yeast infection-thrush. Offer diflucan 150 mg, # 4, 1 daily x 4 days

## 2014-01-08 NOTE — Telephone Encounter (Signed)
Called and spoke with pt and she stated that she has a white film on her tongue and in her mouth.  She feels that this is coming from some of her meds and feels that this thick film is causing her to get choked up.  She is requesting further recs on how to get rid of this.  CY please advise. Thanks  Allergies  Allergen Reactions  . Ivp Dye [Iodinated Diagnostic Agents] Nausea And Vomiting    Reaction: hot flashes  . Promethazine-Codeine Nausea And Vomiting     Current Outpatient Prescriptions on File Prior to Visit  Medication Sig Dispense Refill  . albuterol (PROAIR HFA) 108 (90 BASE) MCG/ACT inhaler Inhale 2 puffs into the lungs every 6 (six) hours as needed for wheezing. 2 puffs every 4 hours as needed only  if your can't catch your breath  1 Inhaler  2  . amLODipine (NORVASC) 5 MG tablet Take 1 tablet (5 mg total) by mouth daily.  90 tablet  3  . aspirin 325 MG EC tablet Take 1 tablet (325 mg total) by mouth daily.  30 tablet  0  . atorvastatin (LIPITOR) 20 MG tablet Take 1 tablet (20 mg total) by mouth daily at 6 PM.  30 tablet  1  . Biotin 5000 MCG CAPS Take 1 capsule by mouth daily.      . budesonide-formoterol (SYMBICORT) 80-4.5 MCG/ACT inhaler Take 2 puffs first thing in am and then another 2 puffs about 12 hours later.  1 Inhaler  12  . cetirizine (ZYRTEC) 10 MG tablet Take 10 mg by mouth at bedtime.       . fluticasone (FLONASE) 50 MCG/ACT nasal spray Place 2 sprays into both nostrils 2 (two) times daily.  16 g  11  . montelukast (SINGULAIR) 10 MG tablet Take 1 tablet (10 mg total) by mouth at bedtime.  30 tablet  11  . omeprazole (PRILOSEC) 20 MG capsule Take 1 capsule (20 mg total) by mouth every morning.  30 capsule  11  . oxybutynin (DITROPAN) 5 MG tablet Take 1 tablet (5 mg total) by mouth at bedtime.  30 tablet  1  . spironolactone (ALDACTONE) 12.5 mg TABS tablet Take 0.5 tablets (12.5 mg total) by mouth daily.  30 tablet  1  . zinc gluconate 50 MG tablet Take 50 mg by mouth  daily.       No current facility-administered medications on file prior to visit.

## 2014-01-09 ENCOUNTER — Emergency Department (HOSPITAL_COMMUNITY)
Admission: EM | Admit: 2014-01-09 | Discharge: 2014-01-09 | Disposition: A | Discharge status: Home / Self Care | Payer: 59 | Attending: Emergency Medicine | Admitting: Emergency Medicine

## 2014-01-09 ENCOUNTER — Encounter (HOSPITAL_COMMUNITY): Payer: Self-pay | Admitting: Emergency Medicine

## 2014-01-09 DIAGNOSIS — K219 Gastro-esophageal reflux disease without esophagitis: Secondary | ICD-10-CM | POA: Insufficient documentation

## 2014-01-09 DIAGNOSIS — S0993XA Unspecified injury of face, initial encounter: Secondary | ICD-10-CM | POA: Insufficient documentation

## 2014-01-09 DIAGNOSIS — I69959 Hemiplegia and hemiparesis following unspecified cerebrovascular disease affecting unspecified side: Secondary | ICD-10-CM | POA: Insufficient documentation

## 2014-01-09 DIAGNOSIS — Y9389 Activity, other specified: Secondary | ICD-10-CM | POA: Insufficient documentation

## 2014-01-09 DIAGNOSIS — IMO0002 Reserved for concepts with insufficient information to code with codable children: Secondary | ICD-10-CM | POA: Insufficient documentation

## 2014-01-09 DIAGNOSIS — Z860100 Personal history of colon polyps, unspecified: Secondary | ICD-10-CM | POA: Insufficient documentation

## 2014-01-09 DIAGNOSIS — E669 Obesity, unspecified: Secondary | ICD-10-CM | POA: Insufficient documentation

## 2014-01-09 DIAGNOSIS — J45909 Unspecified asthma, uncomplicated: Secondary | ICD-10-CM | POA: Insufficient documentation

## 2014-01-09 DIAGNOSIS — Z79899 Other long term (current) drug therapy: Secondary | ICD-10-CM | POA: Insufficient documentation

## 2014-01-09 DIAGNOSIS — S0003XA Contusion of scalp, initial encounter: Secondary | ICD-10-CM | POA: Insufficient documentation

## 2014-01-09 DIAGNOSIS — W208XXA Other cause of strike by thrown, projected or falling object, initial encounter: Secondary | ICD-10-CM | POA: Insufficient documentation

## 2014-01-09 DIAGNOSIS — S199XXA Unspecified injury of neck, initial encounter: Principal | ICD-10-CM

## 2014-01-09 DIAGNOSIS — W19XXXA Unspecified fall, initial encounter: Secondary | ICD-10-CM

## 2014-01-09 DIAGNOSIS — F3289 Other specified depressive episodes: Secondary | ICD-10-CM | POA: Insufficient documentation

## 2014-01-09 DIAGNOSIS — Z7982 Long term (current) use of aspirin: Secondary | ICD-10-CM | POA: Insufficient documentation

## 2014-01-09 DIAGNOSIS — I10 Essential (primary) hypertension: Secondary | ICD-10-CM | POA: Insufficient documentation

## 2014-01-09 DIAGNOSIS — F329 Major depressive disorder, single episode, unspecified: Secondary | ICD-10-CM | POA: Insufficient documentation

## 2014-01-09 DIAGNOSIS — W08XXXA Fall from other furniture, initial encounter: Secondary | ICD-10-CM | POA: Insufficient documentation

## 2014-01-09 DIAGNOSIS — S0083XA Contusion of other part of head, initial encounter: Secondary | ICD-10-CM

## 2014-01-09 DIAGNOSIS — Z9109 Other allergy status, other than to drugs and biological substances: Secondary | ICD-10-CM | POA: Insufficient documentation

## 2014-01-09 DIAGNOSIS — Y92009 Unspecified place in unspecified non-institutional (private) residence as the place of occurrence of the external cause: Secondary | ICD-10-CM | POA: Insufficient documentation

## 2014-01-09 DIAGNOSIS — S1093XA Contusion of unspecified part of neck, initial encounter: Secondary | ICD-10-CM

## 2014-01-09 DIAGNOSIS — M5030 Other cervical disc degeneration, unspecified cervical region: Secondary | ICD-10-CM | POA: Insufficient documentation

## 2014-01-09 DIAGNOSIS — I69920 Aphasia following unspecified cerebrovascular disease: Secondary | ICD-10-CM | POA: Insufficient documentation

## 2014-01-09 NOTE — ED Notes (Signed)
Pt c/o L side facial swelling after a fall this afternoon.  Pt sts lost her balance and fell off a step stool in her laundry room.  Sts she landed on her buttocks, but baskets fell and hit her face.  Some swelling noted above L eye.  Denies pain.  No blood thinners.

## 2014-01-09 NOTE — ED Provider Notes (Signed)
CSN: [id number]     Arrival date & time 01/09/14  1739 History   First MD Initiated Contact with Patient 01/09/14 1822     Chief Complaint  Patient presents with  . Fall  . Head Injury   (Consider location/radiation/quality/duration/timing/severity/associated sxs/prior Treatment) Patient is a 63 y.o. female presenting with fall and head injury. The history is provided by the patient and medical records. No language interpreter was used.  Fall Pertinent negatives include no abdominal pain, arthralgias, chest pain, chills, coughing, fever, headaches, joint swelling, nausea, neck pain, numbness, rash, vomiting or weakness.  Head Injury Associated symptoms: no headaches, no nausea, no neck pain, no numbness and no vomiting     Andrea Mason is a 63 y.o. 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  Pt reports she couldn't feel where her right foot was (at baseline) and fell backwards onto her bottom. Some baskets fell off the shelf and hit her in the forehead.  Pt denies hitting her head on the floor, No LOC, no syncope or near syncope.  She denies neck pain, back pain, or headache.  Nothing makes it better or worse.  Pt takes ASA, but no anticoagulants.  Pt denies fever, chills, visual disturbance, CP, SOB, new weakness/numbness, dysuria, abd pain, N/V/D.       Past Medical History  Diagnosis Date  . Hypertension   . GERD (gastroesophageal reflux disease)   . Allergic rhinitis   . Asthma   . Obesity   . Depression   . History of colonic polyps   . DJD (degenerative joint disease), cervical   . 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 Stroke    Past Surgical History  Procedure Laterality Date  . Nasal sinus surgery  07/2006    Dr. [name]  . Nasal polyp  surgery  07/2006    x 2 with Dr. [name]  . Foot surgery  1998  . Tubal ligation     Family History  Problem Relation Age of Onset  . Colon cancer Brother    History  Substance Use Topics  . Smoking status: Never Smoker   . Smokeless tobacco: Never Used  . Alcohol Use: No   OB History   Grav Para Term Preterm Abortions TAB SAB Ect Mult Living                 Review of Systems  Constitutional: Negative for fever and chills.  HENT: Positive for facial swelling. Negative for dental problem and nosebleeds.   Eyes: Negative for visual disturbance.  Respiratory: Negative for cough, chest tightness, shortness of breath, wheezing and stridor.   Cardiovascular: Negative for chest pain.  Gastrointestinal: Negative for nausea, vomiting and abdominal pain.  Genitourinary: Negative for dysuria, hematuria and flank pain.  Musculoskeletal: Negative for arthralgias, back pain, gait problem, joint swelling, neck pain and neck stiffness.  Skin: Negative for rash and wound.  Neurological: Negative for syncope, weakness, light-headedness, numbness and headaches.  Hematological: Does not bruise/bleed easily.  Psychiatric/Behavioral: The patient is not nervous/anxious.   All other systems reviewed and are negative.    Allergies  Ivp dye and Promethazine-codeine  Home Medications   Current Outpatient Rx  Name  Route  Sig  Dispense  Refill  . albuterol (PROAIR HFA) 108 (90 BASE)  MCG/ACT inhaler   Inhalation   Inhale 2 puffs into the lungs every 6 (six) hours as needed for wheezing. 2 puffs every 4 hours as needed only  if your can't catch your breath   1 Inhaler   2   . amLODipine (NORVASC) 5 MG tablet   Oral   Take 1 tablet (5 mg total) by mouth daily.   90 tablet   3   . aspirin 325 MG EC tablet   Oral   Take 1 tablet (325 mg total) by mouth daily.   30 tablet   0   . atorvastatin (LIPITOR) 20 MG tablet   Oral   Take 1 tablet (20 mg total) by mouth daily at 6 PM.   30  tablet   1   . Biotin 5000 MCG CAPS   Oral   Take 1 capsule by mouth daily.         . budesonide-formoterol (SYMBICORT) 80-4.5 MCG/ACT inhaler      Take 2 puffs first thing in am and then another 2 puffs about 12 hours later.   1 Inhaler   12   . cetirizine (ZYRTEC) 10 MG tablet   Oral   Take 10 mg by mouth at bedtime.          . fluconazole (DIFLUCAN) 150 MG tablet   Oral   Take 1 tablet (150 mg total) by mouth daily.   4 tablet   0   . fluticasone (FLONASE) 50 MCG/ACT nasal spray   Each Nare   Place 2 sprays into both nostrils 2 (two) times daily.   16 g   11   . montelukast (SINGULAIR) 10 MG tablet   Oral   Take 1 tablet (10 mg total) by mouth at bedtime.   30 tablet   11   . omeprazole (PRILOSEC) 20 MG capsule   Oral   Take 1 capsule (20 mg total) by mouth every morning.   30 capsule   11   . oxybutynin (DITROPAN) 5 MG tablet   Oral   Take 1 tablet (5 mg total) by mouth at bedtime.   30 tablet   1   . spironolactone (ALDACTONE) 12.5 mg TABS tablet   Oral   Take 0.5 tablets (12.5 mg total) by mouth daily.   30 tablet   1   . zinc gluconate 50 MG tablet   Oral   Take 50 mg by mouth daily.          BP 136/98  Pulse 90  Resp 17  SpO2 98% Physical Exam  Nursing note and vitals reviewed. Constitutional: She is oriented to person, place, and time. She appears well-developed and well-nourished. No distress.  HENT:  Head: Normocephalic and atraumatic.  Right Ear: Tympanic membrane, external ear and ear canal normal.  Left Ear: Tympanic membrane, external ear and ear canal normal.  Nose: Nose normal.  Mouth/Throat: Uvula is midline, oropharynx is clear and moist and mucous membranes are normal. No uvula swelling. No oropharyngeal exudate, posterior oropharyngeal edema, posterior oropharyngeal erythema or tonsillar abscesses.  Very small amount of swelling above the left eye with very small amount of ecchymosis.   No thrush of the oropharynx   Eyes: Conjunctivae and EOM are normal. Pupils are equal, round, and reactive to light. No scleral icterus. Right eye exhibits normal extraocular motion. Left eye exhibits normal extraocular motion.  EOMs full No diplopia No pain to palpation of the orbit or socket.   Neck: Normal  range of motion. Neck supple.  Cardiovascular: Normal rate, regular rhythm, normal heart sounds and intact distal pulses.   RRR   Pulmonary/Chest: Effort normal and breath sounds normal. No respiratory distress. She has no wheezes. She has no rales.  Clear and equal  Abdominal: Soft. Bowel sounds are normal. There is no tenderness. There is no rebound and no guarding.  Musculoskeletal: Normal range of motion.  Lymphadenopathy:    She has no cervical adenopathy.  Neurological: She is alert and oriented to person, place, and time. She has normal reflexes. No cranial nerve deficit. She exhibits normal muscle tone. Coordination normal.  Speech is dysarthric, but goal oriented, follows commands Cranial nerves III - XII without deficit, no facial droop 5/5 strength in upper and lower extremities on the left and 4/5 strength in upper and lower extremities on the right Strong and equal grip strength Sensation decreased to light and sharp touch in the right lower extremity and palm of right hand; normal in the left Moves extremities without ataxia, coordination intact Normal finger to nose and rapid alternating movements Neg romberg, no pronator drift Normal gait Normal heel-shin while sitting  Skin: Skin is warm and dry. No rash noted. She is not diaphoretic. No erythema.  Psychiatric: She has a normal mood and affect. Her behavior is normal. Judgment and thought content normal.    ED Course  Procedures (including critical care time) Labs Review Labs Reviewed - No data to display Imaging Review No results found.  EKG Interpretation   None       MDM   1. Fall at home   2. Injury of forehead    Ziona Cicero presents after mechanical fall at home with very small contusion and ecchymosis to the left forehead after being hit with a basket.  Pt without headache, neck pain, back pain or LOC.  Pt with recent CVA and persistent deficits but at baseline today as compared to record review from office f/u on 01/06/14.  Pt is alert and oriented without new focal neuro deficit and is not taking anticoagulants.  Pt in stable condition prior to d/c.  Recommend f/u with PCP and pt is in agreement.    It has been determined that no acute conditions requiring further emergency intervention are present at this time. The patient/guardian have been advised of the diagnosis and plan. We have discussed signs and symptoms that warrant return to the ED, such as changes or worsening in symptoms.   Vital signs are stable at discharge.   BP 136/98  Pulse 90  Resp 17  SpO2 98%  Patient/guardian has voiced understanding and agreed to follow-up with the PCP or specialist.       Abigail Butts, PA-C 01/09/14 1928

## 2014-01-09 NOTE — Discharge Instructions (Signed)
1. Medications: usual home medications 2. Treatment: rest, drink plenty of fluids, ice for swelling, tylenol for headache 3. Follow Up: Please followup with your primary doctor for discussion of your diagnoses and further evaluation after today's visit; return to the ED for new or worsening symptoms

## 2014-01-10 NOTE — ED Provider Notes (Signed)
Medical screening examination/treatment/procedure(s) were performed by non-physician practitioner and as supervising physician I was immediately available for consultation/collaboration.  EKG Interpretation   None         Blanchard Kelch, MD 01/10/14 1125

## 2014-01-19 ENCOUNTER — Ambulatory Visit: Payer: 59 | Attending: Physical Medicine & Rehabilitation | Admitting: Physical Therapy

## 2014-01-19 DIAGNOSIS — R269 Unspecified abnormalities of gait and mobility: Secondary | ICD-10-CM | POA: Insufficient documentation

## 2014-01-19 DIAGNOSIS — I10 Essential (primary) hypertension: Secondary | ICD-10-CM | POA: Insufficient documentation

## 2014-01-19 DIAGNOSIS — R5381 Other malaise: Secondary | ICD-10-CM | POA: Insufficient documentation

## 2014-01-19 DIAGNOSIS — IMO0001 Reserved for inherently not codable concepts without codable children: Secondary | ICD-10-CM | POA: Insufficient documentation

## 2014-01-19 DIAGNOSIS — M129 Arthropathy, unspecified: Secondary | ICD-10-CM | POA: Insufficient documentation

## 2014-01-19 DIAGNOSIS — E269 Hyperaldosteronism, unspecified: Secondary | ICD-10-CM | POA: Insufficient documentation

## 2014-01-19 DIAGNOSIS — E669 Obesity, unspecified: Secondary | ICD-10-CM | POA: Insufficient documentation

## 2014-01-19 DIAGNOSIS — Z8673 Personal history of transient ischemic attack (TIA), and cerebral infarction without residual deficits: Secondary | ICD-10-CM | POA: Insufficient documentation

## 2014-01-27 ENCOUNTER — Ambulatory Visit: Payer: 59 | Admitting: Physical Therapy

## 2014-01-28 ENCOUNTER — Telehealth: Payer: Self-pay | Admitting: Internal Medicine

## 2014-01-28 MED ORDER — CLOTRIMAZOLE 10 MG MT TROC: 10.0000 mg | Freq: Four times a day (QID) | OROMUCOSAL | Status: DC | PRN

## 2014-01-28 NOTE — Telephone Encounter (Signed)
Pt aware of recs. rx sent. Nothing further needed

## 2014-01-28 NOTE — Telephone Encounter (Signed)
Clotrimazole troche 10 mg # 12 One qid prn

## 2014-01-28 NOTE — Telephone Encounter (Signed)
Called spoke with pt. She c/o thrush in her mouth d/t her symbicort. Her tongue is covered in white. Please advise MW thanks  Allergies  Allergen Reactions  . Ivp Dye [Iodinated Diagnostic Agents] Nausea And Vomiting    Reaction: hot flashes  . Promethazine-Codeine Nausea And Vomiting

## 2014-02-02 ENCOUNTER — Ambulatory Visit: Payer: 59 | Attending: Physical Medicine & Rehabilitation | Admitting: Physical Therapy

## 2014-02-02 DIAGNOSIS — Z8673 Personal history of transient ischemic attack (TIA), and cerebral infarction without residual deficits: Secondary | ICD-10-CM | POA: Diagnosis not present

## 2014-02-02 DIAGNOSIS — R269 Unspecified abnormalities of gait and mobility: Secondary | ICD-10-CM | POA: Insufficient documentation

## 2014-02-02 DIAGNOSIS — E269 Hyperaldosteronism, unspecified: Secondary | ICD-10-CM | POA: Insufficient documentation

## 2014-02-02 DIAGNOSIS — IMO0001 Reserved for inherently not codable concepts without codable children: Secondary | ICD-10-CM | POA: Insufficient documentation

## 2014-02-02 DIAGNOSIS — I10 Essential (primary) hypertension: Secondary | ICD-10-CM | POA: Insufficient documentation

## 2014-02-02 DIAGNOSIS — M129 Arthropathy, unspecified: Secondary | ICD-10-CM | POA: Insufficient documentation

## 2014-02-02 DIAGNOSIS — E669 Obesity, unspecified: Secondary | ICD-10-CM | POA: Diagnosis not present

## 2014-02-02 DIAGNOSIS — R5381 Other malaise: Secondary | ICD-10-CM | POA: Diagnosis not present

## 2014-02-03 ENCOUNTER — Telehealth: Payer: Self-pay

## 2014-02-03 MED ORDER — OXYBUTYNIN CHLORIDE 5 MG PO TABS: 5.0000 mg | ORAL_TABLET | Freq: Every day | ORAL | Status: DC

## 2014-02-03 NOTE — Telephone Encounter (Signed)
Ok to RF oxybutnin

## 2014-02-03 NOTE — Telephone Encounter (Signed)
Oxybutnin escribed.

## 2014-02-03 NOTE — Telephone Encounter (Signed)
Pharmacy sent a request to refill Oxybutnin 5 mg tablet. Is this okay?

## 2014-02-05 ENCOUNTER — Ambulatory Visit: Payer: 59 | Admitting: Physical Therapy

## 2014-02-05 DIAGNOSIS — IMO0001 Reserved for inherently not codable concepts without codable children: Secondary | ICD-10-CM | POA: Diagnosis not present

## 2014-02-09 ENCOUNTER — Ambulatory Visit: Payer: 59

## 2014-02-09 ENCOUNTER — Ambulatory Visit: Payer: 59 | Admitting: Physical Therapy

## 2014-02-09 DIAGNOSIS — IMO0001 Reserved for inherently not codable concepts without codable children: Secondary | ICD-10-CM | POA: Diagnosis not present

## 2014-02-10 ENCOUNTER — Encounter: Payer: Self-pay | Admitting: Physical Medicine & Rehabilitation

## 2014-02-10 ENCOUNTER — Ambulatory Visit (HOSPITAL_BASED_OUTPATIENT_CLINIC_OR_DEPARTMENT_OTHER): Payer: 59 | Admitting: Physical Medicine & Rehabilitation

## 2014-02-10 ENCOUNTER — Encounter: Payer: 59 | Attending: Physical Medicine & Rehabilitation

## 2014-02-10 VITALS — BP 155/81 | HR 82 | Resp 14 | Ht 61.0 in | Wt 210.0 lb

## 2014-02-10 DIAGNOSIS — J45909 Unspecified asthma, uncomplicated: Secondary | ICD-10-CM | POA: Insufficient documentation

## 2014-02-10 DIAGNOSIS — I69959 Hemiplegia and hemiparesis following unspecified cerebrovascular disease affecting unspecified side: Secondary | ICD-10-CM | POA: Insufficient documentation

## 2014-02-10 DIAGNOSIS — I69920 Aphasia following unspecified cerebrovascular disease: Secondary | ICD-10-CM

## 2014-02-10 DIAGNOSIS — F329 Major depressive disorder, single episode, unspecified: Secondary | ICD-10-CM | POA: Insufficient documentation

## 2014-02-10 DIAGNOSIS — I69998 Other sequelae following unspecified cerebrovascular disease: Secondary | ICD-10-CM

## 2014-02-10 DIAGNOSIS — I10 Essential (primary) hypertension: Secondary | ICD-10-CM | POA: Insufficient documentation

## 2014-02-10 DIAGNOSIS — F3289 Other specified depressive episodes: Secondary | ICD-10-CM | POA: Insufficient documentation

## 2014-02-10 DIAGNOSIS — I69320 Aphasia following cerebral infarction: Secondary | ICD-10-CM | POA: Insufficient documentation

## 2014-02-10 DIAGNOSIS — Z860100 Personal history of colon polyps, unspecified: Secondary | ICD-10-CM | POA: Insufficient documentation

## 2014-02-10 DIAGNOSIS — R209 Unspecified disturbances of skin sensation: Secondary | ICD-10-CM

## 2014-02-10 DIAGNOSIS — E669 Obesity, unspecified: Secondary | ICD-10-CM | POA: Insufficient documentation

## 2014-02-10 DIAGNOSIS — K219 Gastro-esophageal reflux disease without esophagitis: Secondary | ICD-10-CM | POA: Insufficient documentation

## 2014-02-10 NOTE — Patient Instructions (Addendum)
Year stroke has caused problems with speech as well as problems with swallowing as well as problems with movement on the right side and sensation on the right side  If blood pressure remains elevated please call Dr. Jenny Reichmann

## 2014-02-10 NOTE — Progress Notes (Signed)
Subjective:    Patient ID: Andrea Mason, female    DOB: 1951-07-06, 63 y.o.   MRN: [id number] Last visit 2/4 HPI CC Left MCA infarct: [facility] post hospital  63 year old right-handed female, history of hypertension and  asthma who was admitted on November 20, 2013, with altered mental  status, questionable syncopal episode, and right-sided weakness with  aphasia. MRI of the brain showed acute left MCA infarction as well as  small area of acute infarct head of the caudate on left.    Patient still with speech problems  Continues to have problems with the right hand. He is able to dress and bathe herself.  Also having problems with walking. 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 General activity 4 Relation with others 6 Enjoyment of life 8 What TIME of day is your pain at its worst? evening,night Sleep (in general) Fair  Pain is worse with: some activites Pain improves with: rest Relief from Meds: no meds  Mobility walk without assistance ability to climb steps?  yes do you drive?  no transfers alone Do you have any goals in this area?  yes  Function not employed: date last employed 06/2013 I need assistance with the following:  household duties and shopping Do you have any goals in this area?  yes  Neuro/Psych trouble walking loss of taste or smell  Prior Studies Any changes since last visit?  no  Physicians involved in your care Any changes since last visit?  no   Family History  Problem Relation Age of Onset  . Colon cancer Brother    History   Social History  . Marital Status: Widowed    Spouse Name: N/A    Number of Children: 3  . Years of Education: N/A   Occupational History  . cosmetic consultant    [employer]   Social History Main Topics  . Smoking status: Never Smoker   . Smokeless tobacco: Never Used  . Alcohol Use: No  . Drug Use: No  . Sexual Activity: Not Currently   Other Topics Concern  . 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   Past Surgical History  Procedure Laterality Date  . Nasal sinus surgery  07/2006    Dr. [name]  . Nasal polyp surgery  07/2006    x 2 with Dr. [name]  . Foot surgery  1998  . Tubal ligation     Past Medical History  Diagnosis Date  . Hypertension   . GERD (gastroesophageal reflux disease)   . Allergic rhinitis   . Asthma   . Obesity   . Depression   . History of colonic polyps   . DJD (degenerative joint disease), cervical   . 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 Stroke    BP 155/81  Pulse 82  Resp 14  Ht 5\' 1"  (1.549 m)  Wt 210 lb (95.255 kg)  BMI 39.70 kg/m2  SpO2 98%  Opioid Risk Score:   Fall  Risk Score: Moderate Fall Risk (6-13 points) (pt educated on fall risk, pt given brochure)    Review of Systems  HENT:       Loss of taste  Musculoskeletal: Positive for gait problem.  All other systems reviewed and are negative.       Objective:   Physical Exam Decreased sensation on the palm of the right hand.  Motor strength is 4/5 in the right upper and right lower and 5/5 in the left upper and left lower  Visual fields are intact to confrontation testing  Tone is normal.  Speech has word finding difficulties, dysarthria Decreased sensation in the right hand and right leg       Assessment & Plan:  1. 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 Patient has completed inpatient therapy as well as home health therapy and we will make a referral for outpatient therapy.  Also recommend followup  with neurologist  Direct at questions about blood pressure to her primary physician Feel the patient will be qualify for disability

## 2014-02-11 ENCOUNTER — Telehealth: Payer: Self-pay | Admitting: Internal Medicine

## 2014-02-11 ENCOUNTER — Ambulatory Visit: Payer: 59

## 2014-02-11 ENCOUNTER — Ambulatory Visit: Payer: 59 | Admitting: Physical Therapy

## 2014-02-11 DIAGNOSIS — IMO0001 Reserved for inherently not codable concepts without codable children: Secondary | ICD-10-CM | POA: Diagnosis not present

## 2014-02-11 MED ORDER — MOMETASONE FURO-FORMOTEROL FUM 100-5 MCG/ACT IN AERO: 2.0000 | INHALATION_SPRAY | Freq: Two times a day (BID) | RESPIRATORY_TRACT | Status: DC

## 2014-02-11 NOTE — Telephone Encounter (Signed)
dulera 100 2bid

## 2014-02-11 NOTE — Telephone Encounter (Signed)
Called [facility]. Was advised pt symbicort is now requiring PA. [phone] Id #: [id number] Was advised this was denied on 02/05/14.  Was advised pt needs to try and fail advair diskus or dulera. Please advise MW thanks

## 2014-02-11 NOTE — Telephone Encounter (Signed)
lmomtcb x1 for pt 

## 2014-02-11 NOTE — Telephone Encounter (Signed)
Returning a call

## 2014-02-11 NOTE — Telephone Encounter (Signed)
Pt returned call

## 2014-02-11 NOTE — Telephone Encounter (Signed)
Pt returned call.  Holly D Pryor ° °

## 2014-02-11 NOTE — Telephone Encounter (Signed)
lmomtcb x1 to make pt aware of change

## 2014-02-11 NOTE — Telephone Encounter (Signed)
[id number] x1 

## 2014-02-11 NOTE — Telephone Encounter (Signed)
lmomtcb x1--which medication?

## 2014-02-11 NOTE — Telephone Encounter (Signed)
Pt advised and rx sent. Jennifer Castillo, CMA  

## 2014-02-11 NOTE — Telephone Encounter (Signed)
Needs refill on symbicort. Per pt, we need to call [facility] to approve it.  Call them at [phone].  [facility] [address]

## 2014-02-12 ENCOUNTER — Telehealth: Payer: Self-pay | Admitting: Internal Medicine

## 2014-02-12 NOTE — Telephone Encounter (Signed)
Spoke with the pt and notified of recs per MW She verbalized understanding  Samples up front  Appt with MW was scheduled for 02/26/13 Nothing further needed

## 2014-02-12 NOTE — Telephone Encounter (Signed)
Called spoke with pt. She reports the dulera is over $200. Her insurance denied symbicort. She reports she can't afford this and we don't have samples. Please advise MW thanks  Allergies  Allergen Reactions  . Ivp Dye [Iodinated Diagnostic Agents] Nausea And Vomiting    Reaction: hot flashes  . Promethazine-Codeine Nausea And Vomiting

## 2014-02-12 NOTE — Telephone Encounter (Signed)
Give symbicort x 2 weeks then ov with me or [name] with formulary in hand

## 2014-02-16 ENCOUNTER — Ambulatory Visit: Payer: 59

## 2014-02-16 ENCOUNTER — Ambulatory Visit: Payer: BC Managed Care – PPO | Admitting: Physical Therapy

## 2014-02-16 DIAGNOSIS — IMO0001 Reserved for inherently not codable concepts without codable children: Secondary | ICD-10-CM | POA: Diagnosis not present

## 2014-02-18 ENCOUNTER — Ambulatory Visit: Payer: BC Managed Care – PPO | Admitting: Physical Therapy

## 2014-02-18 ENCOUNTER — Ambulatory Visit: Payer: 59

## 2014-02-23 ENCOUNTER — Ambulatory Visit: Payer: BC Managed Care – PPO | Admitting: Physical Therapy

## 2014-02-23 ENCOUNTER — Ambulatory Visit: Payer: 59

## 2014-02-23 DIAGNOSIS — IMO0001 Reserved for inherently not codable concepts without codable children: Secondary | ICD-10-CM | POA: Diagnosis not present

## 2014-02-25 ENCOUNTER — Ambulatory Visit: Payer: BC Managed Care – PPO | Admitting: Physical Therapy

## 2014-02-25 ENCOUNTER — Ambulatory Visit: Payer: 59

## 2014-02-25 DIAGNOSIS — IMO0001 Reserved for inherently not codable concepts without codable children: Secondary | ICD-10-CM | POA: Diagnosis not present

## 2014-02-26 ENCOUNTER — Encounter: Payer: Self-pay | Admitting: Internal Medicine

## 2014-02-26 ENCOUNTER — Ambulatory Visit (INDEPENDENT_AMBULATORY_CARE_PROVIDER_SITE_OTHER): Payer: 59 | Admitting: Internal Medicine

## 2014-02-26 VITALS — BP 118/62 | HR 71 | Temp 97.6°F | Ht 60.0 in | Wt 210.0 lb

## 2014-02-26 DIAGNOSIS — J45909 Unspecified asthma, uncomplicated: Secondary | ICD-10-CM

## 2014-02-26 MED ORDER — BUDESONIDE-FORMOTEROL FUMARATE 80-4.5 MCG/ACT IN AERO: INHALATION_SPRAY | RESPIRATORY_TRACT | Status: DC

## 2014-02-26 NOTE — Patient Instructions (Addendum)
Just use your symbicort if you have cough shortness or breath or wheeze   [employer] guarantees you only pay 25 dollars a month maximum - - if it gets denied they used the wrong code at the pharmacy  See [name] in 4  weeks with all your medications, even over the counter meds, separated in two separate bags, the ones you take no matter what vs the ones you stop once you feel better and take only as needed when you feel you need them.   [name]  will generate for you a new user friendly medication calendar that will put [place] all on the same page re: your medication use.     Without this process, it simply isn't possible to assure that we are providing  your outpatient care  with  the attention to detail we feel you deserve.   If we cannot assure that you're getting that kind of care,  then we cannot manage your problem effectively from this clinic.  Once you have seen [name] and we are sure that we're all on the same page with your medication use she will arrange follow up with me.

## 2014-02-26 NOTE — Progress Notes (Deleted)
Subjective:     Patient ID: Andrea Mason, female   DOB: 1951/02/16  MRN: [id number] HPI  89 yobf never smoker followed by Dr Annamaria Boots for allergic rhinitis and asthma   02/05/2013 1st pulmonary eval in EPIC era baseline = completely better on qvar 80 2bid and only use rescue once a week at baseline then much worse starting 3/3 with cough/ congestion/ green mucus and rescue x one by time of ov at 2pm.  No resting sob, very hoarse with harsh barking cough >Augmentin rx   03/17/2013 Follow up and med calendar   no change rx, use med calendar   04/18/2013  ov/Tanazia Achee acute w/in no calendar Chief Complaint  Patient presents with  . Acute Visit    Pt c/o wheezing for the past several wks, worse today after being exposed to fragrences. She also c/o cough since last night, non prod.    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 rec Prednisone 10 mg take  4 each am x 2 days,   2 each am x 2 days,  1 each am x2days and stop Only use your albuterol as a rescue    06/03/2013 f/u ov/Kaoru Benda f/u asthma/ chronic rhinitis Chief Complaint  Patient presents with  . Follow-up    6 week follow up Complains of green mucous   abrupt cough onset x one week, not needing proaire, assoc with nasal congestion but minimal sob. Rec: augmentin/ pred  06/30/13- 89 yobf never smoker followed byDr Melvyn Novas for asthma/ Last saw Dr Annamaria Boots 2012-for allergic rhinitis and asthma  FOLLOW UP:  pt reports she has had a few episodes with sinus infections/URI requing rounds of abx-- would like to discuss restarting allergy vaccine per Dr. Melvyn Novas patient needs allegies more under control She was on allergy vaccine for one or 2 years, stopping 2 years ago-drifted off. Complains of frequent nasal congestion, green nasal discharge treated with prednisone and amoxicillin. Right eye waters. Some sneeze- perennial but worse in spring and fall pollen seasons. Using Zyrtec and Flonase. Denies headaches,  ear pressure or itching.  History of sinus surgery twice by Dr. [name]. CT head 2012-chronic sinus disease    08/21/13-62 yobf never smoker followed by Dr Melvyn Novas for asthma/  Dr Annamaria Boots [phone] allergic rhinitis and asthma  FOLLOWS FOR: Not on Allergy vaccine ; has not got Zpak that was called into pharmacy recently We reviewed skin testing from 2011. She had tried to start allergy vaccine but could not afford it and says that she cannot afford it now. Perennial rhinitis and asthma. Can't afford to fill recent prescription for Zithromax. When she takes Augmentin she says it loosens ropy mucus from her nose. Continues daily Zyrtec, Alvesco and uses rescue inhaler 2 or 3 days per week.  Has seen Dr Ayesha Mohair ENT/ [place] and Dr [name] ENT..  09/23/13-  3 yobf never smoker followed by Dr Melvyn Novas for asthma/  Dr Annamaria Boots for allergic rhinitis FOLLOWS FOR:has stopped vaccine; still working with insurance about  allergy coverage. Thinks she has a sinus infection. Sneezing and coughing with green from nose. Ears feel full and itch. She did not followup with Dr. Lovenia Shuck rec augmentin > did not take alvesco > ran out of samples and did not fill rx   10/24/2013 f/u ov/Labrisha Wuellner re: cough > rhinitis > sob Chief Complaint  Patient presents with  . Acute Visit    Pt c/o sore throat, non prod  cough and PND x 1 wk.   also very hoarse. Green discharge from nose in am's esp. No perceived increased need for saba. rec symbicort 80 Take 2 puffs first thing in am and then another 2 puffs about 12 hours later for any flare of resp symptoms Prednisone 10 mg take  4 each am x 2 days,   2 each am x 2 days,  1 each am x 2 days and stop When coughing you need  Try prilosec(omeprazole) 20mg   Take 30-60 min before first meal of the day and Ranitidine 150 mg one bedtime until cough is completely gone for at least a week without the need for cough suppression   11/14/2013 f/u ov/Pedram Goodchild re: ? Cough variant asthma   Chief Complaint  Patient presents with  . Acute Visit    Pt c/o cough x 5 days- prod with green sputum.   seeing [facility] for [facility]  Not using symbicort now  rec Prednisone 10 mg take  4 each am x 2 days,   2 each am x 2 days,  1 each am x 2 days and stop  Augmentin 875 mg take one pill twice daily  X 10 days - take at breakfast and supper with large glass of water.  It would help reduce the usual side effects (diarrhea and yeast infections) if you ate cultured yogurt at lunch.  For cough use the flutter valve and Take mucinex dm 1200 every  12 hours and supplement if needed with  tramadol 50 mg up to 2 every 4 hours to suppress the urge to cough.  h. Once you have eliminated the cough for 3 straight days try reducing the tramadol first,  then the mucinex dm Stay on prilosec 20 mg Take 30-60 min before first meal of the day and zantac 150 mg at bedtime GERD  Diet . See [name] w/in 4  Weeks >   02/26/2014 post  f/u ov/Khadeeja Elden re:         No obvious day to day or daytime variabilty or assoc chronic cough or cp or chest tightness, subjective wheeze overt sinus or hb symptoms. No unusual exp hx or h/o childhood pna/ asthma or knowledge of premature birth.  Sleeping ok without nocturnal   of respiratory  c/o's or need for noct saba. Also denies any obvious fluctuation of symptoms with weather or environmental changes or other aggravating or alleviating factors except as outlined above   Current Medications, Allergies, Complete Past Medical History, Past Surgical History, Family History, and Social History were reviewed in [employer] [facility] record.     ROS  The following are not active complaints unless bolded sore throat, dysphagia, dental problems, itching, sneezing,  nasal congestion or excess/ purulent secretions, ear ache,   fever, chills, sweats, unintended wt loss, pleuritic or exertional cp, hemoptysis,  orthopnea pnd or leg swelling, presyncope, palpitations,  heartburn, abdominal pain, anorexia, nausea, vomiting, diarrhea  or change in bowel or urinary habits, change in stools or urine, dysuria,hematuria,  rash, arthralgias, visual complaints, headache, numbness weakness or ataxia or problems with walking or coordination,  change in mood/affect or memory.     OBJ- Physical Exam General- Alert, Oriented, Affect-appropriate, Distress- none acute. obese Skin- rash-none, lesions- none, excoriation- none Lymphadenopathy- none Head- atraumatic            Eyes- Gross vision intact, PERRLA, conjunctivae and secretions clear            Ears- Hearing, canals-normal, TMs  clear            Nose- Clear, no-Septal dev, mucus, polyps, erosion, perforation             Throat- Mallampati IV , mucosa clear , drainage-none, tonsils- atrophic, + hoarse Neck- flexible , trachea midline, no stridor , thyroid nl, carotid no bruit Chest - symmetrical excursion , unlabored           Heart/CV- RRR , no murmur , no gallop  , no rub, nl s1 s2                           - JVD- none , edema- none, stasis changes- none, varices- none           Lung- Clear, unlabored,  Clear of wheezing  dullness-none, rub- none            Abd-  Br/ Gen/ Rectal- Not done, not indicated Extrem- cyanosis- none, clubbing, none, atrophy- none, strength- nl Neuro- grossly intact to observation      Assessment:

## 2014-02-27 NOTE — Progress Notes (Signed)
Subjective:     Patient ID: Andrea Mason, female   DOB: 03-Jul-1951  MRN: [id number] HPI  44 yobf never smoker followed by Dr Annamaria Boots for allergic rhinitis and asthma   02/05/2013 1st pulmonary eval in EPIC era baseline = completely better on qvar 80 2bid and only use rescue once a week at baseline then much worse starting 3/3 with cough/ congestion/ green mucus and rescue x one by time of ov at 2pm.  No resting sob, very hoarse with harsh barking cough >Augmentin rx   03/17/2013 Follow up and med calendar   no change rx, use med calendar   04/18/2013  ov/Andrea Mason acute w/in no calendar Chief Complaint  Patient presents with  . Acute Visit    Pt c/o wheezing for the past several wks, worse today after being exposed to fragrences. She also c/o cough since last night, non prod.    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 rec Prednisone 10 mg take  4 each am x 2 days,   2 each am x 2 days,  1 each am x2days and stop Only use your albuterol as a rescue    06/03/2013 f/u ov/Andrea Mason f/u asthma/ chronic rhinitis Chief Complaint  Patient presents with  . Follow-up    6 week follow up Complains of green mucous   abrupt cough onset x one week, not needing proaire, assoc with nasal congestion but minimal sob. Rec: augmentin/ pred  06/30/13- 86 yobf never smoker followed byDr Melvyn Novas for asthma/ Last saw Dr Annamaria Boots 2012-for allergic rhinitis and asthma  FOLLOW UP:  pt reports she has had a few episodes with sinus infections/URI requing rounds of abx-- would like to discuss restarting allergy vaccine per Dr. Melvyn Novas patient needs allegies more under control She was on allergy vaccine for one or 2 years, stopping 2 years ago-drifted off. Complains of frequent nasal congestion, green nasal discharge treated with prednisone and amoxicillin. Right eye waters. Some sneeze- perennial but worse in spring and fall pollen seasons. Using Zyrtec and Flonase. Denies headaches,  ear pressure or itching.  History of sinus surgery twice by Dr. [name]. CT head 2012-chronic sinus disease    08/21/13-62 yobf never smoker followed by Dr Melvyn Novas for asthma/  Dr Annamaria Boots [phone] allergic rhinitis and asthma  FOLLOWS FOR: Not on Allergy vaccine ; has not got Zpak that was called into pharmacy recently We reviewed skin testing from 2011. She had tried to start allergy vaccine but could not afford it and says that she cannot afford it now. Perennial rhinitis and asthma. Can't afford to fill recent prescription for Zithromax. When she takes Augmentin she says it loosens ropy mucus from her nose. Continues daily Zyrtec, Alvesco and uses rescue inhaler 2 or 3 days per week.  Has seen Dr Ayesha Mohair ENT/ [place] and Dr [name] ENT..  09/23/13-  32 yobf never smoker followed by Dr Melvyn Novas for asthma/  Dr Annamaria Boots for allergic rhinitis FOLLOWS FOR:has stopped vaccine; still working with insurance about  allergy coverage. Thinks she has a sinus infection. Sneezing and coughing with green from nose. Ears feel full and itch. She did not followup with Dr. Lovenia Shuck rec augmentin > did not take alvesco > ran out of samples and did not fill rx   10/24/2013 f/u ov/Andrea Mason re: cough > rhinitis > sob Chief Complaint  Patient presents with  . Acute Visit    Pt c/o sore throat, non prod  cough and PND x 1 wk.   also very hoarse. Green discharge from nose in am's esp. No perceived increased need for saba. 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  2. Subcutaneous Lovenox for deep venous thrombosis prophylaxis.  3. Dysphagia.  4. Hypertension.  5. Acute sinusitis, resolved.  6.  Hyperlipidemia.  7. Gastroesophageal reflux disease.  8. Asthma.   02/26/14 MW/ post hosp ov from [facility] doing well but confused again with meds, did not bring med calendar "they've changed my meds" no need for saba on symbicort 80 2bid   No obvious day to day or daytime variabilty or assoc   cp or chest tightness, subjective wheeze overt sinus or hb symptoms. No unusual exp hx or h/o childhood pna/ asthma or knowledge of premature birth.  Sleeping ok without nocturnal   of respiratory  c/o's or need for noct saba. Also denies any obvious fluctuation of symptoms with weather or environmental changes or other aggravating or alleviating factors except as outlined above   Current Medications, Allergies, Complete Past Medical History, Past Surgical History, Family History, and Social History were reviewed in [employer] record.     ROS  The following are not active complaints unless bolded sore throat, dysphagia, dental problems, itching, sneezing,  nasal congestion or excess/ purulent secretions, ear ache,   fever, chills, sweats, unintended wt loss, pleuritic or exertional cp, hemoptysis,  orthopnea pnd or leg swelling, presyncope, palpitations, heartburn, abdominal pain, anorexia, nausea, vomiting, diarrhea  or change in bowel or urinary habits, change in stools or urine, dysuria,hematuria,  rash, arthralgias, visual complaints, headache, numbness weakness or ataxia or problems with walking or coordination,  change in mood/affect or memory.     OBJ- Physical Exam  amb wf with expressive aphasia and R arm weakness  General- Alert, Oriented, Affect-appropriate, Distress- none acute. Obese \\HEENT : nl dentition, turbinates, and orophanx. Nl external ear canals without cough reflex   NECK :  without JVD/Nodes/TM/ nl carotid upstrokes bilaterally   LUNGS: no acc muscle use, clear to A and P bilaterally without cough on insp or exp maneuvers   CV:  RRR  no s3 or murmur  or increase in P2, no edema   ABD:  soft and nontender with nl excursion in the supine position. No bruits or organomegaly, bowel sounds nl  MS:  warm without deformities, calf tenderness, cyanosis or clubbing  SKIN: warm and dry without lesions                            Assessment:

## 2014-02-27 NOTE — Assessment & Plan Note (Signed)
Actually doing well but having trouble affording symbicort - not actually using it as a maintenance rx which is a reasonable alternative that works in [place] but needs to remember to restart it immediately for any resp problems    Each maintenance medication was reviewed in detail including most importantly the difference between maintenance and as needed and under what circumstances the prns are to be used.  Please see instructions for details which were reviewed in writing and the patient given a copy.   F/u with our NP to regroup re med use from multiple providers

## 2014-03-03 ENCOUNTER — Ambulatory Visit: Payer: 59

## 2014-03-03 ENCOUNTER — Ambulatory Visit: Payer: 59 | Admitting: Occupational Therapy

## 2014-03-03 DIAGNOSIS — IMO0001 Reserved for inherently not codable concepts without codable children: Secondary | ICD-10-CM | POA: Diagnosis not present

## 2014-03-04 ENCOUNTER — Ambulatory Visit: Payer: 59 | Attending: Physical Medicine & Rehabilitation

## 2014-03-04 ENCOUNTER — Ambulatory Visit: Payer: 59 | Admitting: Occupational Therapy

## 2014-03-04 DIAGNOSIS — I10 Essential (primary) hypertension: Secondary | ICD-10-CM | POA: Insufficient documentation

## 2014-03-04 DIAGNOSIS — E669 Obesity, unspecified: Secondary | ICD-10-CM | POA: Insufficient documentation

## 2014-03-04 DIAGNOSIS — M129 Arthropathy, unspecified: Secondary | ICD-10-CM | POA: Insufficient documentation

## 2014-03-04 DIAGNOSIS — Z8673 Personal history of transient ischemic attack (TIA), and cerebral infarction without residual deficits: Secondary | ICD-10-CM | POA: Insufficient documentation

## 2014-03-04 DIAGNOSIS — E269 Hyperaldosteronism, unspecified: Secondary | ICD-10-CM | POA: Insufficient documentation

## 2014-03-04 DIAGNOSIS — R269 Unspecified abnormalities of gait and mobility: Secondary | ICD-10-CM | POA: Insufficient documentation

## 2014-03-04 DIAGNOSIS — R5381 Other malaise: Secondary | ICD-10-CM | POA: Insufficient documentation

## 2014-03-04 DIAGNOSIS — IMO0001 Reserved for inherently not codable concepts without codable children: Secondary | ICD-10-CM | POA: Insufficient documentation

## 2014-03-11 ENCOUNTER — Ambulatory Visit: Payer: 59

## 2014-03-13 ENCOUNTER — Ambulatory Visit: Payer: 59 | Admitting: Occupational Therapy

## 2014-03-13 ENCOUNTER — Encounter: Payer: Self-pay | Admitting: Physical Medicine & Rehabilitation

## 2014-03-13 ENCOUNTER — Encounter: Payer: 59 | Attending: Physical Medicine & Rehabilitation

## 2014-03-13 ENCOUNTER — Ambulatory Visit: Payer: 59

## 2014-03-13 ENCOUNTER — Ambulatory Visit (HOSPITAL_BASED_OUTPATIENT_CLINIC_OR_DEPARTMENT_OTHER): Payer: 59 | Admitting: Physical Medicine & Rehabilitation

## 2014-03-13 VITALS — BP 130/80 | HR 78 | Resp 17 | Ht 61.0 in | Wt 204.0 lb

## 2014-03-13 DIAGNOSIS — Z860100 Personal history of colon polyps, unspecified: Secondary | ICD-10-CM | POA: Insufficient documentation

## 2014-03-13 DIAGNOSIS — I69959 Hemiplegia and hemiparesis following unspecified cerebrovascular disease affecting unspecified side: Secondary | ICD-10-CM | POA: Insufficient documentation

## 2014-03-13 DIAGNOSIS — J45909 Unspecified asthma, uncomplicated: Secondary | ICD-10-CM | POA: Diagnosis not present

## 2014-03-13 DIAGNOSIS — F3289 Other specified depressive episodes: Secondary | ICD-10-CM | POA: Insufficient documentation

## 2014-03-13 DIAGNOSIS — F329 Major depressive disorder, single episode, unspecified: Secondary | ICD-10-CM | POA: Diagnosis not present

## 2014-03-13 DIAGNOSIS — I10 Essential (primary) hypertension: Secondary | ICD-10-CM | POA: Diagnosis not present

## 2014-03-13 DIAGNOSIS — E669 Obesity, unspecified: Secondary | ICD-10-CM | POA: Insufficient documentation

## 2014-03-13 DIAGNOSIS — I69920 Aphasia following unspecified cerebrovascular disease: Secondary | ICD-10-CM | POA: Insufficient documentation

## 2014-03-13 DIAGNOSIS — R209 Unspecified disturbances of skin sensation: Principal | ICD-10-CM

## 2014-03-13 DIAGNOSIS — I69998 Other sequelae following unspecified cerebrovascular disease: Secondary | ICD-10-CM

## 2014-03-13 DIAGNOSIS — K219 Gastro-esophageal reflux disease without esophagitis: Secondary | ICD-10-CM | POA: Insufficient documentation

## 2014-03-13 DIAGNOSIS — I69320 Aphasia following cerebral infarction: Secondary | ICD-10-CM

## 2014-03-13 NOTE — Progress Notes (Signed)
Subjective:    Patient ID: Andrea Mason, female    DOB: 02-May-1951, 63 y.o.   MRN: [id number] Last visit 3/10 HPI Left MCA infarct: [facility] post hospital  62 year old right-handed female, history of hypertension and  asthma who was admitted on November 20, 2013, with altered mental  status, questionable syncopal episode, and right-sided weakness with  aphasia. MRI of the brain showed acute left MCA infarction as well as  small area of acute infarct head of the caudate on left.   Still doing outpt speech and OT Still having problems with the right hand, independent with dressing and bathing  Pain Inventory Average Pain 1 Pain Right Now 0 My pain is sharp  In the last 24 hours, has pain interfered with the following? General activity 1 Relation with others 0 Enjoyment of life 9 What TIME of day is your pain at its worst? night Sleep (in general) Good  Pain is worse with: na Pain improves with: therapy/exercise and medication Relief from Meds: na  Mobility walk without assistance ability to climb steps?  yes do you drive?  yes Do you have any goals in this area?  yes  Function retired  Neuro/Psych bladder control problems  Prior Studies Any changes since last visit?  no  Physicians involved in your care Any changes since last visit?  no   Family History  Problem Relation Age of Onset  . Colon cancer Brother    History   Social History  . Marital Status: Widowed    Spouse Name: N/A    Number of Children: 3  . Years of Education: N/A   Occupational History  . cosmetic consultant     [employer]   Social History Main Topics  . Smoking status: Never Smoker   . Smokeless tobacco: Never Used  . Alcohol Use: No  . Drug Use: No  . Sexual Activity: Not Currently   Other Topics Concern  . 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   Past Surgical History  Procedure Laterality Date  . Nasal sinus surgery  07/2006    Dr. [name]  . Nasal polyp surgery  07/2006    x 2 with Dr. [name]  . Foot surgery  1998  . Tubal ligation     Past Medical History  Diagnosis Date  . Hypertension   . GERD (gastroesophageal reflux disease)   . Allergic rhinitis   . Asthma   . Obesity   . Depression   . History of colonic polyps   . DJD (degenerative joint disease), cervical   . 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 Stroke    BP 130/80  Pulse 78  Resp 17  Ht 5\' 1"  (1.549 m)  Wt 204 lb (92.534 kg)  BMI 38.57 kg/m2  SpO2 97%  Opioid Risk Score:   Fall Risk Score:     Review of Systems  Respiratory: Positive for cough and shortness of breath.   Genitourinary: Positive for difficulty urinating.  All other systems reviewed and are negative.      Objective:   Physical Exam  Decreased sensation on the palm of the right hand.  Motor strength is 5-5 in the right upper and right lower and 5/5 in the left upper and  left lower  Visual fields are intact to confrontation testing  Tone is normal.  Speech has word finding difficulties, dysarthria  Decreased sensation in the right hand and right leg       Assessment & Plan:  1. 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 Has appointment with neurologist on 03/31/2014  Will go to the [facility] driving assessment Continue outpatient OT and speech

## 2014-03-13 NOTE — Patient Instructions (Signed)
The you have any speech problem related to stroke called aphasia

## 2014-03-17 ENCOUNTER — Ambulatory Visit: Payer: 59 | Admitting: Occupational Therapy

## 2014-03-18 ENCOUNTER — Telehealth: Payer: Self-pay | Admitting: *Deleted

## 2014-03-18 NOTE — Telephone Encounter (Signed)
Forms received and filled out and faxed back.  Will forward message to leslie to keep an eye out for the approval or denial.

## 2014-03-18 NOTE — Telephone Encounter (Signed)
Called [phone] and waiting for the fax to come through to complete the forms for the PA for the symbicort.

## 2014-03-19 ENCOUNTER — Telehealth: Payer: Self-pay | Admitting: Internal Medicine

## 2014-03-19 MED ORDER — MOMETASONE FURO-FORMOTEROL FUM 100-5 MCG/ACT IN AERO: 2.0000 | INHALATION_SPRAY | Freq: Two times a day (BID) | RESPIRATORY_TRACT | Status: DC

## 2014-03-19 NOTE — Telephone Encounter (Signed)
Spoke with pt. Aware of medication change. Nothing further needed

## 2014-03-19 NOTE — Telephone Encounter (Signed)
Spoke w/ pt. Made her aware ventolin and proair is the same medication. Both are albuterol. Nothing further needed

## 2014-03-19 NOTE — Telephone Encounter (Signed)
Ok - dulera 100 Take 2 puffs first thing in am and then another 2 puffs about 12 hours later.

## 2014-03-19 NOTE — Telephone Encounter (Signed)
Received denial for symbicort  Preferred drugs are advair or symbicort  Please advise thanks

## 2014-03-20 ENCOUNTER — Ambulatory Visit: Payer: 59 | Admitting: Occupational Therapy

## 2014-03-20 ENCOUNTER — Ambulatory Visit: Payer: 59

## 2014-03-24 ENCOUNTER — Ambulatory Visit (INDEPENDENT_AMBULATORY_CARE_PROVIDER_SITE_OTHER): Payer: 59 | Admitting: Internal Medicine

## 2014-03-24 ENCOUNTER — Encounter: Payer: Self-pay | Admitting: Internal Medicine

## 2014-03-24 VITALS — BP 130/88 | HR 92 | Temp 98.1°F | Ht 59.5 in | Wt 205.0 lb

## 2014-03-24 DIAGNOSIS — J45909 Unspecified asthma, uncomplicated: Secondary | ICD-10-CM

## 2014-03-24 DIAGNOSIS — J3089 Other allergic rhinitis: Secondary | ICD-10-CM

## 2014-03-24 DIAGNOSIS — J302 Other seasonal allergic rhinitis: Secondary | ICD-10-CM

## 2014-03-24 DIAGNOSIS — J309 Allergic rhinitis, unspecified: Secondary | ICD-10-CM

## 2014-03-24 MED ORDER — PREDNISONE 10 MG PO TABS: ORAL_TABLET | ORAL | Status: DC

## 2014-03-24 MED ORDER — AMOXICILLIN-POT CLAVULANATE 875-125 MG PO TABS: 1.0000 | ORAL_TABLET | Freq: Two times a day (BID) | ORAL | Status: DC

## 2014-03-24 NOTE — Progress Notes (Signed)
Subjective:     Patient ID: Andrea Mason, female   DOB: August 06, 1951  MRN: [id number]   Brief patient profile:  64 yobf never smoker followed by Dr Annamaria Boots for allergic rhinitis and asthma   History of Present Illness  02/05/2013 1st pulmonary eval in EPIC era baseline = completely better on qvar 80 2bid and only use rescue once a week at baseline then much worse starting 3/3 with cough/ congestion/ green mucus and rescue x one by time of ov at 2pm.  No resting sob, very hoarse with harsh barking cough >Augmentin rx   03/17/2013 Follow up and med calendar   no change rx, use med calendar   04/18/2013  ov/Wert acute w/in no calendar Chief Complaint  Patient presents with  . Acute Visit    Pt c/o wheezing for the past several wks, worse today after being exposed to fragrences. She also c/o cough since last night, non prod.    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 rec Prednisone 10 mg take  4 each am x 2 days,   2 each am x 2 days,  1 each am x2days and stop Only use your albuterol as a rescue    06/03/2013 f/u ov/Wert f/u asthma/ chronic rhinitis Chief Complaint  Patient presents with  . Follow-up    6 week follow up Complains of green mucous   abrupt cough onset x one week, not needing proaire, assoc with nasal congestion but minimal sob. Rec: augmentin/ pred  06/30/13- 40 yobf never smoker followed byDr Melvyn Novas for asthma/ Last saw Dr Annamaria Boots 2012-for allergic rhinitis and asthma  FOLLOW UP:  pt reports she has had a few episodes with sinus infections/URI requing rounds of abx-- would like to discuss restarting allergy vaccine per Dr. Melvyn Novas patient needs allegies more under control She was on allergy vaccine for one or 2 years, stopping 2 years ago-drifted off. Complains of frequent nasal congestion, green nasal discharge treated with prednisone and amoxicillin. Right eye waters. Some sneeze- perennial but worse in spring and fall pollen  seasons. Using Zyrtec and Flonase. Denies headaches, ear pressure or itching.  History of sinus surgery twice by Dr. [name]. CT head 2012-chronic sinus disease    08/21/13-62 yobf never smoker followed by Dr Melvyn Novas for asthma/  Dr Annamaria Boots [phone] allergic rhinitis and asthma  FOLLOWS FOR: Not on Allergy vaccine ; has not got Zpak that was called into pharmacy recently We reviewed skin testing from 2011. She had tried to start allergy vaccine but could not afford it and says that she cannot afford it now. Perennial rhinitis and asthma. Can't afford to fill recent prescription for Zithromax. When she takes Augmentin she says it loosens ropy mucus from her nose. Continues daily Zyrtec, Alvesco and uses rescue inhaler 2 or 3 days per week.  Has seen Dr Ayesha Mohair [facility]/ [facility] and Dr [name] ENT..  09/23/13-  67 yobf never smoker followed by Dr Melvyn Novas for asthma/  Dr Annamaria Boots for allergic rhinitis FOLLOWS FOR:has stopped vaccine; still working with insurance about  allergy coverage. Thinks she has a sinus infection. Sneezing and coughing with green from nose. Ears feel full and itch. She did not followup with Dr. Lovenia Shuck rec augmentin > did not take alvesco > ran out of samples and did not fill rx   10/24/2013 f/u ov/Wert re: cough > rhinitis > sob Chief Complaint  Patient presents with  . Acute Visit  Pt c/o sore throat, non prod cough and PND x 1 wk.   also very hoarse. Green discharge from nose in am's esp. No perceived increased need for saba. 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  2. Subcutaneous Lovenox for deep venous thrombosis prophylaxis.  3. Dysphagia.  4.  Hypertension.  5. Acute sinusitis, resolved.  6. Hyperlipidemia.  7. Gastroesophageal reflux disease.  8. Asthma.   02/26/14 MW/ post hosp ov from [facility] doing well but confused again with meds, did not bring med calendar "they've changed my meds" no need for saba on symbicort 80 2bid  rec Just use your symbicort if you have cough shortness or breath or wheeze  See [name] in 4  Weeks for med calendar    03/24/2014 acute  ov/Wert re: asthma flare / no med calendar yet, confused with names of meds/ not sure she's on symbicort and using lots of saba including 3 h prior to [place] Complaint  Patient presents with  . Acute Visit    Pt c/o wheezing for the past several wks- worse at night and unable to sleep.  She also c/o occ cough- prod occ with minimal green sputum.     No obvious day to day or daytime variabilty or assoc   cp or chest tightness    or hb symptoms. No unusual exp hx or h/o childhood pna/ asthma or knowledge of premature birth.  Sleeping ok without nocturnal   of respiratory  c/o's or need for noct saba. Also denies any obvious fluctuation of symptoms with weather or environmental changes or other aggravating or alleviating factors except as outlined above   Current Medications, Allergies, Complete Past Medical History, Past Surgical History, Family History, and Social History were reviewed in [employer] record.     ROS  The following are not active complaints unless bolded sore throat, dysphagia, dental problems, itching, sneezing,  nasal congestion or excess/ purulent secretions, ear ache,   fever, chills, sweats, unintended wt loss, pleuritic or exertional cp, hemoptysis,  orthopnea pnd or leg swelling, presyncope, palpitations, heartburn, abdominal pain, anorexia, nausea, vomiting, diarrhea  or change in bowel or urinary habits, change in stools or urine, dysuria,hematuria,  rash, arthralgias, visual complaints, headache, numbness weakness or  ataxia or problems with walking or coordination,  change in mood/affect or memory.      OBJ- Physical Exam   amb wf with expressive aphasia and R arm weakness   General- Alert, Oriented, Affect-appropriate, Distress- none acute. Obese \\HEENT : nl dentition, turbinates, and orophanx. Nl external ear canals without cough reflex   NECK :  without JVD/Nodes/TM/ nl carotid upstrokes bilaterally   LUNGS: no acc muscle use, very minimal bilateal rhonchi/ prominent pseudowheeze    CV:  RRR  no s3 or murmur or increase in P2, no edema   ABD:  soft and nontender with nl excursion in the supine position. No bruits or organomegaly, bowel sounds nl  MS:  warm without deformities, calf tenderness, cyanosis or clubbing  SKIN: warm and dry without lesions  Assessment:

## 2014-03-24 NOTE — Patient Instructions (Addendum)
Work on inhaler technique:  relax and gently blow all the way out then take a nice smooth deep breath back in, triggering the inhaler at same time you start breathing in.  Hold for up to 5 seconds if you can.  Rinse and gargle with water when done  Augmentin 875 mg take one pill twice daily  X 10 days - take at breakfast and supper with large glass of water.  It would help reduce the usual side effects (diarrhea and yeast infections) if you ate cultured yogurt at lunch.   Prednisone 10 mg take  4 each am x 2 days,   2 each am x 2 days,  1 each am x 2 days and stop   See [name] w/in 2 weeks with all your medications, even over the counter meds, separated in two separate bags, the ones you take no matter what vs the ones you stop once you feel better and take only as needed when you feel you need them.   [name]  will generate for you a new user friendly medication calendar that will put [place] all on the same page re: your medication use.     Without this process, it simply isn't possible to assure that we are providing  your outpatient care  with  the attention to detail we feel you deserve.   If we cannot assure that you're getting that kind of care,  then we cannot manage your problem effectively from this clinic.  Once you have seen [name] and we are sure that we're all on the same page with your medication use she will arrange follow up with me.

## 2014-03-25 ENCOUNTER — Ambulatory Visit: Payer: 59 | Admitting: Occupational Therapy

## 2014-03-25 NOTE — Assessment & Plan Note (Signed)
Strongly rec restart allergy vaccines if possible, reviewed

## 2014-03-25 NOTE — Assessment & Plan Note (Signed)
DDX of  difficult airways managment all start with A and  include Adherence, Ace Inhibitors, Acid Reflux, Active Sinus Disease, Alpha 1 Antitripsin deficiency, Anxiety masquerading as Airways dz,  ABPA,  allergy(esp in young), Aspiration (esp in elderly), Adverse effects of DPI,  Active smokers, plus two Bs  = Bronchiectasis and Beta blocker use..and one C= CHF  Adherence is always the initial "prime suspect" and is a multilayered concern that requires a "trust but verify" approach in every patient - starting with knowing how to use medications, especially inhalers, correctly, keeping up with refills and understanding the fundamental difference between maintenance and prns vs those medications only taken for a very short course and then stopped and not refilled.  The proper method of use, as well as anticipated side effects, of a metered-dose inhaler are discussed and demonstrated to the patient. Improved effectiveness after extensive coaching during this visit to a level of approximately  75% so continue either symbicort 80 or dulera 100 2bid (the lower dose less likely to set off uacs)  ? Sinus dz > Augmentin 875 mg take one pill twice daily  X 10 days - take at breakfast and supper with large glass of water.  It would help reduce the usual side effects (diarrhea and yeast infections) if you ate cultured yogurt at lunch.   ? Allergic > Prednisone 10 mg take  4 each am x 2 days,   2 each am x 2 days,  1 each am x 2 days and stop    To keep things simple, I have asked the patient to first separate medicines that are perceived as maintenance, that is to be taken daily "no matter what", from those medicines that are taken on only on an as-needed basis and I have given the patient examples of both, and then return to see our NP to generate a  detailed  medication calendar which should be followed until the next physician sees the patient and updates it.

## 2014-03-27 ENCOUNTER — Ambulatory Visit: Payer: 59 | Admitting: Occupational Therapy

## 2014-03-31 ENCOUNTER — Encounter: Payer: Self-pay | Admitting: Neurology

## 2014-03-31 ENCOUNTER — Ambulatory Visit (INDEPENDENT_AMBULATORY_CARE_PROVIDER_SITE_OTHER): Payer: 59 | Admitting: Neurology

## 2014-03-31 ENCOUNTER — Ambulatory Visit: Payer: 59 | Admitting: Occupational Therapy

## 2014-03-31 VITALS — BP 128/82 | HR 77 | Ht 61.5 in | Wt 202.0 lb

## 2014-03-31 DIAGNOSIS — I6340 Cerebral infarction due to embolism of unspecified cerebral artery: Secondary | ICD-10-CM

## 2014-03-31 DIAGNOSIS — I639 Cerebral infarction, unspecified: Secondary | ICD-10-CM | POA: Insufficient documentation

## 2014-03-31 NOTE — Progress Notes (Signed)
[facility] [address] [place]. [place] [address] [phone]       OFFICE CONSULT NOTE  [name] Date of Birth:  1951-01-29 Medical Record Number:  [id number]   Referring MD:  Andrea Mason Reason for Referral:  stroke  HPI: 32 year African American lady who was admitted with sudden onset of confusion and right hand weakness on 11/22/13. She was initially thought to have a syncopal event but subsequent workup including an MRI scan showed a left insular cortex and parietal infarct. Patient was admitted to [facility] and seen by the neuro hospital this. MRA of the brain which I personally reviewed shows decrease MCA branches in the area of the stroke but no large vessel proximal occlusion. Transthoracic echo showed normal ejection fraction without cardiac source of embolism. Telemetry monitoring did not reveal any obvious cardiac arrhythmias. Patient was started on aspirin for stroke prevention. Carotid Dopplers were unremarkable. Lipid profile showed total cholesterol of 80, triglycerides 53, HDL 46 and LDL 23 mg percent. Hemoglobin A1c was borderline at 6.1. Patient was transferred to inpatient rehabilitation where she stayed for a few weeks and was discharged home. She's done well since discharge. She still getting outpatient speech therapy and will finish next week. She has regained most of her speech back though she has learned to speak slowly and did greatly. She still has some word finding difficulties and word hesitancy. She has residual numbness in the right hand and has trouble cooking food but is able to do most activities for herself and is quite independent. She states her blood pressure is well controlled and it is 128/82 today. She has no prior history of strokes or TIAs. She is primary hyper or posterior known as an and follows up with her nephrologist. She has no known history of atrial fibrillation , smoking, diabetes or significant cardiac disease.  She has remote childhood history of syncopal events but nothing in recent years. Interestingly she states that following her recent stroke her accent has changed to what sounds like a [place] accent and family members find this quite amusing.  ROS:   14 system review of systems is positive for speech difficulty, insomnia, difficulty swallowing, skin sensitivity, allergies, snoring, wheezing, cough, shortness of breath, itching and occasional trouble swallowing  PMH:  Past Medical History  Diagnosis Date  . Hypertension   . GERD (gastroesophageal reflux disease)   . Allergic rhinitis   . Asthma   . Obesity   . Depression   . History of colonic polyps   . DJD (degenerative joint disease), cervical   . 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 Stroke     Social History:  History   Social History  . Marital Status: Widowed    Spouse Name: N/A    Number of Children: 3  . Years of Education: N/A   Occupational History  . cosmetic consultant     [employer]   Social History Main Topics  . Smoking status: Never Smoker   . Smokeless tobacco: Never Used  . Alcohol Use: No  . Drug Use: No  . Sexual Activity: Not Currently   Other Topics Concern  . 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    Medications:  Current Outpatient Prescriptions on File Prior to Visit  Medication Sig Dispense Refill  . albuterol (PROAIR HFA) 108 (90 BASE) MCG/ACT inhaler Inhale 2 puffs into the lungs every 6 (six) hours as needed for wheezing. 2 puffs every 4 hours as needed only  if your can't catch your breath  1 Inhaler  2  . amLODipine (NORVASC) 5 MG tablet Take 1 tablet (5 mg total) by mouth daily.  90 tablet  3  . amoxicillin-clavulanate (AUGMENTIN) 875-125 MG per tablet Take 1  tablet by mouth 2 (two) times daily.  20 tablet  0  . aspirin 325 MG EC tablet Take 1 tablet (325 mg total) by mouth daily.  30 tablet  0  . Biotin 5000 MCG CAPS Take 1 capsule by mouth daily.      . budesonide-formoterol (SYMBICORT) 80-4.5 MCG/ACT inhaler Take 2 puffs first thing in am and then another 2 puffs about 12 hours later.  1 Inhaler  12  . cetirizine (ZYRTEC) 10 MG tablet Take 10 mg by mouth daily.       . clotrimazole (MYCELEX) 10 MG troche Take 1 tablet (10 mg total) by mouth 4 (four) times daily as needed.  12 tablet  0  . dextromethorphan-guaiFENesin (MUCINEX DM) 30-600 MG per 12 hr tablet Take 1 tablet by mouth 2 (two) times daily as needed for cough.      . fluticasone (FLONASE) 50 MCG/ACT nasal spray Place 2 sprays into both nostrils 2 (two) times daily.  16 g  11  . mometasone-formoterol (DULERA) 100-5 MCG/ACT AERO Inhale 2 puffs into the lungs 2 (two) times daily.  1 Inhaler  3  . montelukast (SINGULAIR) 10 MG tablet Take 1 tablet (10 mg total) by mouth at bedtime.  30 tablet  11  . omeprazole (PRILOSEC) 20 MG capsule Take 20 mg by mouth every morning.      Marland Kitchen oxybutynin (DITROPAN) 5 MG tablet Take 1 tablet (5 mg total) by mouth at bedtime.  30 tablet  1  . predniSONE (DELTASONE) 10 MG tablet Take  4 each am x 2 days,   2 each am x 2 days,  1 each am x 2 days and stop  14 tablet  0  . ranitidine (ZANTAC) 150 MG tablet Take 150 mg by mouth at bedtime as needed for heartburn.      . spironolactone (ALDACTONE) 12.5 mg TABS tablet Take 0.5 tablets (12.5 mg total) by mouth daily.  30 tablet  1   No current facility-administered medications on file prior to visit.    Allergies:   Allergies  Allergen Reactions  . Ivp Dye [Iodinated Diagnostic Agents] Nausea And Vomiting    Reaction: hot flashes  . Promethazine-Codeine Nausea And Vomiting  . Tramadol     Physical Exam General: middle aged obese african american lady, seated, in no evident distress Head: head normocephalic and  atraumatic. Orohparynx benign Neck: supple with no carotid or supraclavicular bruits Cardiovascular: regular rate and rhythm, no murmurs Musculoskeletal: no deformity Skin:  no rash/petichiae Vascular:  Normal pulses all extremities Filed Vitals:   03/31/14 1458  BP: 128/82  Pulse: 77    Neurologic Exam Mental Status: Awake and fully alert. Oriented to place and time. Recent and remote memory intact. Attention span, concentration and fund of knowledge appropriate. Mood and affect appropriate. Mild expressive aphasia with word finding difficulties and some disfluency. Able to speak sentences. Diminished naming animals 4 only. Good comprehension, repetition. Has foreign language accent which is [place]  Cranial Nerves: Fundoscopic exam reveals sharp disc margins. Pupils equal, briskly reactive to light. Extraocular movements full without nystagmus. Visual fields full to confrontation. Hearing intact. Facial sensation intact. Face, tongue, palate moves normally and symmetrically.  Motor: Normal bulk and tone. Normal strength in all tested extremity muscles. Fine finger movements slightly diminished in the right hand. Sensory.: intact to touch and pinprick and vibratory sensation. Slight diminished touch pinprick sensation in the right hand alone. Coordination: Rapid alternating movements normal in all extremities. Finger-to-nose and heel-to-shin performed accurately bilaterally. Gait and Station: Arises from chair without difficulty. Stance is normal. Gait demonstrates normal stride length and balance . Able to heel, toe and tandem walk without difficulty.  Reflexes: 1+ and symmetric. Toes downgoing.   NIHSS  2 Modified Rankin  1   ASSESSMENT: 57 year African American lady with a left MCA branch infarct in December 8841 of embolic in etiology without definite identified source. Vascular risk factors of hypertension and mild obesity alone.    PLAN: I had a long discussion with the patient  and her son regarding her recent stroke, discuss the results of admission hospital and answered questions. Continue aspirin for second stroke prevention and strict control of hypertension with blood pressure goal below 130/90. Continue ongoing speech therapy. Diet, exercise regularly and maintain ideal body weight. We also discussed briefly possible participation in [place] stroke prevention trial for embolic stroke and she has expressed interest. We will give her information to review at home and decide. Patient needs ambulatory cardiac monitoring for paroxysmal atrial fibrillation but this can be done as part of the trial if she decides to participate. Return for followup in 3 months with Alphonsus Sias, NP or earlier if necessary   Note: This document was prepared with digital dictation and possible smart phrase technology. Any transcriptional errors that result from this process are unintentional.

## 2014-03-31 NOTE — Patient Instructions (Addendum)
I had a long discussion with the patient and her son regarding her recent stroke, discuss the results of admission hospital and answered questions. Continue aspirin for second stroke prevention and strict control of hypertension with blood pressure goal below 130/90. Continue ongoing speech therapy. Diet, exercise regularly and maintain ideal body weight. We also discussed briefly possible participation in [place] stroke prevention trial for embolic stroke and she has expressed interest. We will give her information to review at home and decide. Patient needs ambulatory cardiac monitoring for paroxysmal atrial fibrillation but this can be done as part of the trial if she decides to participate. Return for followup in 3 months with Alphonsus Sias, NP or earlier if necessary  Stroke Prevention Some medical conditions and behaviors are associated with an increased chance of having a stroke. You may prevent a stroke by making healthy choices and managing medical conditions. HOW CAN I REDUCE MY RISK OF HAVING A STROKE?   Stay physically active. Get at least 30 minutes of activity on most or all days.  Do not smoke. It may also be helpful to avoid exposure to secondhand smoke.  Limit alcohol use. Moderate alcohol use is considered to be:  No more than 2 drinks per day for men.  No more than 1 drink per day for nonpregnant women.  Eat healthy foods. This involves  Eating 5 or more servings of fruits and vegetables a day.  Following a diet that addresses high blood pressure (hypertension), high cholesterol, diabetes, or obesity.  Manage your cholesterol levels.  A diet low in saturated fat, trans fat, and cholesterol and high in fiber may control cholesterol levels.  Take any prescribed medicines to control cholesterol as directed by your health care provider.  Manage your diabetes.  A controlled-carbohydrate, controlled-sugar diet is recommended to manage diabetes.  Take any prescribed  medicines to control diabetes as directed by your health care provider.  Control your hypertension.  A low-salt (sodium), low-saturated fat, low-trans fat, and low-cholesterol diet is recommended to manage hypertension.  Take any prescribed medicines to control hypertension as directed by your health care provider.  Maintain a healthy weight.  A reduced-calorie, low-sodium, low-saturated fat, low-trans fat, low-cholesterol diet is recommended to manage weight.  Stop drug abuse.  Avoid taking birth control pills.  Talk to your health care provider about the risks of taking birth control pills if you are over 32 years old, smoke, get migraines, or have ever had a blood clot.  Get evaluated for sleep disorders (sleep apnea).  Talk to your health care provider about getting a sleep evaluation if you snore a lot or have excessive sleepiness.  Take medicines as directed by your health care provider.  For some people, aspirin or blood thinners (anticoagulants) are helpful in reducing the risk of forming abnormal blood clots that can lead to stroke. If you have the irregular heart rhythm of atrial fibrillation, you should be on a blood thinner unless there is a good reason you cannot take them.  Understand all your medicine instructions.  Make sure that other other conditions (such as anemia or atherosclerosis) are addressed. SEEK IMMEDIATE MEDICAL CARE IF:   You have sudden weakness or numbness of the face, arm, or leg, especially on one side of the body.  Your face or eyelid droops to one side.  You have sudden confusion.  You have trouble speaking (aphasia) or understanding.  You have sudden trouble seeing in one or both eyes.  You have sudden  trouble walking.  You have dizziness.  You have a loss of balance or coordination.  You have a sudden, severe headache with no known cause.  You have new chest pain or an irregular heartbeat. Any of these symptoms may represent a  serious problem that is an emergency. Do not wait to see if the symptoms will go away. Get medical help at once. Call your local emergency services  (911 in [place] Do not drive yourself to the hospital. Document Released: 12/28/2004 Document Revised: 09/10/2013 Document Reviewed: 05/23/2013 [facility] Patient Information 2014 [place].

## 2014-04-03 ENCOUNTER — Ambulatory Visit: Payer: 59 | Attending: Physical Medicine & Rehabilitation | Admitting: Occupational Therapy

## 2014-04-03 ENCOUNTER — Other Ambulatory Visit: Payer: Self-pay | Admitting: Physical Medicine & Rehabilitation

## 2014-04-03 DIAGNOSIS — M129 Arthropathy, unspecified: Secondary | ICD-10-CM | POA: Insufficient documentation

## 2014-04-03 DIAGNOSIS — E269 Hyperaldosteronism, unspecified: Secondary | ICD-10-CM | POA: Insufficient documentation

## 2014-04-03 DIAGNOSIS — R5381 Other malaise: Secondary | ICD-10-CM | POA: Insufficient documentation

## 2014-04-03 DIAGNOSIS — IMO0001 Reserved for inherently not codable concepts without codable children: Secondary | ICD-10-CM | POA: Insufficient documentation

## 2014-04-03 DIAGNOSIS — R269 Unspecified abnormalities of gait and mobility: Secondary | ICD-10-CM | POA: Insufficient documentation

## 2014-04-03 DIAGNOSIS — I10 Essential (primary) hypertension: Secondary | ICD-10-CM | POA: Insufficient documentation

## 2014-04-03 DIAGNOSIS — E669 Obesity, unspecified: Secondary | ICD-10-CM | POA: Insufficient documentation

## 2014-04-03 DIAGNOSIS — Z8673 Personal history of transient ischemic attack (TIA), and cerebral infarction without residual deficits: Secondary | ICD-10-CM | POA: Insufficient documentation

## 2014-04-06 ENCOUNTER — Telehealth: Payer: Self-pay | Admitting: Internal Medicine

## 2014-04-06 MED ORDER — CLOTRIMAZOLE 10 MG MT TROC: 10.0000 mg | Freq: Four times a day (QID) | OROMUCOSAL | Status: DC | PRN

## 2014-04-06 NOTE — Telephone Encounter (Signed)
Ok to give clotrimazole 10 qid prn #12

## 2014-04-06 NOTE — Telephone Encounter (Signed)
Called spoke with pt. She c/o thrush in mouth x couple days. She reports she does rinse her mouth after inhalers. She is requesting mycelex troche to help. Please advise MW thanks  Allergies  Allergen Reactions  . Ivp Dye [Iodinated Diagnostic Agents] Nausea And Vomiting    Reaction: hot flashes  . Promethazine-Codeine Nausea And Vomiting  . Tramadol

## 2014-04-06 NOTE — Telephone Encounter (Signed)
I called Spoke with pt. Aware of rx called in. Nothing further needed

## 2014-04-07 ENCOUNTER — Ambulatory Visit (INDEPENDENT_AMBULATORY_CARE_PROVIDER_SITE_OTHER): Payer: 59 | Admitting: Adult Health

## 2014-04-07 ENCOUNTER — Encounter: Payer: Self-pay | Admitting: Adult Health

## 2014-04-07 VITALS — BP 124/86 | HR 94 | Temp 98.3°F | Ht 60.75 in | Wt 200.0 lb

## 2014-04-07 DIAGNOSIS — J45909 Unspecified asthma, uncomplicated: Secondary | ICD-10-CM

## 2014-04-07 MED ORDER — FLUTTER DEVI: Status: DC

## 2014-04-07 NOTE — Progress Notes (Signed)
Subjective:     Patient ID: Andrea Mason, female   DOB: August 06, 1951  MRN: [id number]   Brief patient profile:  64 yobf never smoker followed by Andrea Mason for allergic rhinitis and asthma   History of Present Illness  02/05/2013 1st pulmonary eval in EPIC era baseline = completely better on qvar 80 2bid and only use rescue once a week at baseline then much worse starting 3/3 with cough/ congestion/ green mucus and rescue x one by time of ov at 2pm.  No resting sob, very hoarse with harsh barking cough >Augmentin rx   03/17/2013 Follow up and med calendar   no change rx, use med calendar   04/18/2013  ov/Wert acute w/in no calendar Chief Complaint  Patient presents with  . Acute Visit    Pt c/o wheezing for the past several wks, worse today after being exposed to fragrences. She also c/o cough since last night, non prod.    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 rec Prednisone 10 mg take  4 each am x 2 days,   2 each am x 2 days,  1 each am x2days and stop Only use your albuterol as a rescue    06/03/2013 f/u ov/Wert f/u asthma/ chronic rhinitis Chief Complaint  Patient presents with  . Follow-up    6 week follow up Complains of green mucous   abrupt cough onset x one week, not needing proaire, assoc with nasal congestion but minimal sob. Rec: augmentin/ pred  06/30/13- 40 yobf never smoker followed byDr Andrea Mason for asthma/ Last saw Andrea Mason 2012-for allergic rhinitis and asthma  FOLLOW UP:  pt reports she has had a few episodes with sinus infections/URI requing rounds of abx-- would like to discuss restarting allergy vaccine per Andrea. Andrea Mason patient needs allegies more under control She was on allergy vaccine for one or 2 years, stopping 2 years ago-drifted off. Complains of frequent nasal congestion, green nasal discharge treated with prednisone and amoxicillin. Right eye waters. Some sneeze- perennial but worse in spring and fall pollen  seasons. Using Zyrtec and Flonase. Denies headaches, ear pressure or itching.  History of sinus surgery twice by Andrea. Andrea Mason. CT head 2012-chronic sinus disease    08/21/13-62 yobf never smoker followed by Andrea Mason for asthma/  Andrea Mason [phone] allergic rhinitis and asthma  FOLLOWS FOR: Not on Allergy vaccine ; has not got Zpak that was called into pharmacy recently We reviewed skin testing from 2011. She had tried to start allergy vaccine but could not afford it and says that she cannot afford it now. Perennial rhinitis and asthma. Can't afford to fill recent prescription for Zithromax. When she takes Augmentin she says it loosens ropy mucus from her nose. Continues daily Zyrtec, Alvesco and uses rescue inhaler 2 or 3 days per week.  Has seen Andrea Mason ENT/ [facility] and Andrea Mason ENT..  09/23/13-  67 yobf never smoker followed by Andrea Mason for asthma/  Andrea Mason for allergic rhinitis FOLLOWS FOR:has stopped vaccine; still working with insurance about  allergy coverage. Thinks she has a sinus infection. Sneezing and coughing with green from nose. Ears feel full and itch. She did not followup with Andrea. Andrea Mason rec augmentin > did not take alvesco > ran out of samples and did not fill rx   10/24/2013 f/u ov/Wert re: cough > rhinitis > sob Chief Complaint  Patient presents with  . Acute Visit  Pt c/o sore throat, non prod cough and PND x 1 wk.   also very hoarse. Green discharge from nose in am's esp. No perceived increased need for saba. 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  2. Subcutaneous Lovenox for deep venous thrombosis prophylaxis.  3. Dysphagia.  4.  Hypertension.  5. Acute sinusitis, resolved.  6. Hyperlipidemia.  7. Gastroesophageal reflux disease.  8. Asthma.   02/26/14 MW/ post hosp ov from [facility] doing well but confused again with meds, did not bring med calendar "they've changed my meds" no need for saba on symbicort 80 2bid  rec Just use your symbicort if you have cough shortness or breath or wheeze  See Andrea Mason in 4  Weeks for med calendar    03/24/2014 acute  ov/Wert re: asthma flare / no med calendar yet, confused with names of meds/ not sure she's on symbicort and using lots of saba including 3 h prior to [place] Complaint  Patient presents with  . Acute Visit    Pt c/o wheezing for the past several wks- worse at night and unable to sleep.  She also c/o occ cough- prod occ with minimal green sputum.    >>augmentin and steroid taper   04/07/2014 Follow up and Med review  Patient returns for a followup and medication review. We reviewed all her medications and organized them into a patient medication calendar with patient education. Appears, that she's taking her medications correctly.  Patient was seen last with an asthma flare. Last visit, with a URI/sinusitis . She was treated with Augmentin and steroid taper with improvement in symptoms. Patient denies any hemoptysis, fever, orthopnea, PND, or leg swelling. Patient also had thrush and was given Mycelex, which has improved. Her symptoms.    Current Medications, Allergies, Complete Past Medical History, Past Surgical History, Family History, and Social History were reviewed in [employer] record.     ROS  The following are not active complaints unless bolded sore throat, dysphagia, dental problems, itching, sneezing,    ear ache,   fever, chills, sweats, unintended wt loss, pleuritic or exertional cp, hemoptysis,  orthopnea pnd or leg swelling, presyncope, palpitations, heartburn, abdominal pain, anorexia, nausea, vomiting, diarrhea  or change  in bowel or urinary habits, change in stools or urine, dysuria,hematuria,  rash, arthralgias, visual complaints, headache, numbness weakness or ataxia or problems with walking or coordination,  change in mood/affect or memory.      OBJ- Physical Exam   amb wf with expressive aphasia and R arm weakness   General- Alert, Oriented, Affect-appropriate,    \HEENT: nl dentition, turbinates, and orophanx. Nl external ear canals without cough reflex, no thrush noted    NECK :  without JVD/Nodes/TM/ nl carotid upstrokes bilaterally   LUNGS: no acc muscle use, CTA w/ no wheezing   CV:  RRR  no s3 or murmur or increase in P2, no edema   ABD:  soft and nontender with nl excursion in the supine position. No bruits or organomegaly, bowel sounds nl  MS:  warm without  deformities, calf tenderness, cyanosis or clubbing  SKIN: warm and dry without lesions                            Assessment:

## 2014-04-07 NOTE — Assessment & Plan Note (Signed)
Recent exacerbation, with URI >. Now resolved Patient's medications were reviewed today and patient education was given. Computerized medication calendar was adjusted/completed  Oral candidiasis now resolving w/ mycelex   Plan  Finish Mycelex.  Rinse after inhalers  Follow med calendar closely and bring to each visit.  Follow up Dr. Melvyn Novas  In 3 months and As needed   Please contact office for sooner follow up if symptoms do not improve or worsen or seek emergency care

## 2014-04-07 NOTE — Addendum Note (Signed)
Addended by: Parke Poisson E on: 04/07/2014 12:49 PM   Modules accepted: Orders

## 2014-04-07 NOTE — Patient Instructions (Signed)
Finish Mycelex.  Rinse after inhalers  Follow med calendar closely and bring to each visit.  Follow up Dr. Melvyn Novas  In 3 months and As needed   Please contact office for sooner follow up if symptoms do not improve or worsen or seek emergency care

## 2014-04-23 ENCOUNTER — Encounter: Payer: Self-pay | Admitting: Physical Medicine & Rehabilitation

## 2014-04-23 ENCOUNTER — Encounter: Payer: 59 | Attending: Physical Medicine & Rehabilitation

## 2014-04-23 ENCOUNTER — Ambulatory Visit (HOSPITAL_BASED_OUTPATIENT_CLINIC_OR_DEPARTMENT_OTHER): Payer: 59 | Admitting: Physical Medicine & Rehabilitation

## 2014-04-23 VITALS — BP 138/75 | HR 73 | Resp 14 | Ht 61.0 in | Wt 197.0 lb

## 2014-04-23 DIAGNOSIS — F3289 Other specified depressive episodes: Secondary | ICD-10-CM | POA: Insufficient documentation

## 2014-04-23 DIAGNOSIS — F329 Major depressive disorder, single episode, unspecified: Secondary | ICD-10-CM | POA: Insufficient documentation

## 2014-04-23 DIAGNOSIS — I69959 Hemiplegia and hemiparesis following unspecified cerebrovascular disease affecting unspecified side: Secondary | ICD-10-CM | POA: Insufficient documentation

## 2014-04-23 DIAGNOSIS — Z860100 Personal history of colon polyps, unspecified: Secondary | ICD-10-CM | POA: Insufficient documentation

## 2014-04-23 DIAGNOSIS — I69920 Aphasia following unspecified cerebrovascular disease: Secondary | ICD-10-CM

## 2014-04-23 DIAGNOSIS — J45909 Unspecified asthma, uncomplicated: Secondary | ICD-10-CM | POA: Insufficient documentation

## 2014-04-23 DIAGNOSIS — K219 Gastro-esophageal reflux disease without esophagitis: Secondary | ICD-10-CM | POA: Insufficient documentation

## 2014-04-23 DIAGNOSIS — I69998 Other sequelae following unspecified cerebrovascular disease: Secondary | ICD-10-CM

## 2014-04-23 DIAGNOSIS — R209 Unspecified disturbances of skin sensation: Principal | ICD-10-CM

## 2014-04-23 DIAGNOSIS — I69320 Aphasia following cerebral infarction: Secondary | ICD-10-CM

## 2014-04-23 DIAGNOSIS — I10 Essential (primary) hypertension: Secondary | ICD-10-CM | POA: Insufficient documentation

## 2014-04-23 DIAGNOSIS — E669 Obesity, unspecified: Secondary | ICD-10-CM | POA: Insufficient documentation

## 2014-04-23 NOTE — Patient Instructions (Signed)
No interstate driving for 1 month  Speech will continue to improve for about 6 more months  Followup with Dr Leonie Man and Dr Jenny Reichmann

## 2014-04-23 NOTE — Progress Notes (Signed)
Subjective:    Patient ID: Andrea Mason, female    DOB: 27-Oct-1951, 63 y.o.   MRN: [id number]  HPI Left MCA infarct: [facility]  63 year old right-handed female, history of hypertension and  asthma who was admitted on November 20, 2013, with altered mental  status, questionable syncopal episode, and right-sided weakness with  aphasia. MRI of the brain showed acute left MCA infarction as well as  small area of acute infarct head of the caudate on left.   finished outpt speech and OT  The patient went to the [facility] and was told that since she had a valid license  no retesting was available  Has been driving without issues since last Friday. Drives mainly to church and Bible study  Pain Inventory Average Pain 3 Pain Right Now 0 My pain is intermittent, sharp and aching  In the last 24 hours, has pain interfered with the following? General activity 4 Relation with others 4 Enjoyment of life 6 What TIME of day is your pain at its worst? varies Sleep (in general) Fair  Pain is worse with: unsure Pain improves with: na Relief from Meds: 0  Mobility walk without assistance how many minutes can you walk? 45 for exercise ability to climb steps?  yes transfers alone  Function disabled: date disabled 06/2012 Do you have any goals in this area?  no  Neuro/Psych weakness numbness loss of taste or smell  Prior Studies Any changes since last visit?  no  Physicians involved in your care Any changes since last visit?  yes Neurologist Dr. Leonie Man   Family History  Problem Relation Age of Onset  . Colon cancer Brother    History   Social History  . Marital Status: Widowed    Spouse Name: N/A    Number of Children: 3  . Years of Education: N/A   Occupational History  . cosmetic consultant     [employer]   Social History Main Topics  . Smoking status: Never Smoker   . Smokeless tobacco: Never Used  . Alcohol Use: No  . Drug Use: No  . Sexual Activity: Not  Currently   Other Topics Concern  . 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   Past Surgical History  Procedure Laterality Date  . Nasal sinus surgery  07/2006    Dr. [name]  . Nasal polyp surgery  07/2006    x 2 with Dr. [name]  . Foot surgery  1998  . Tubal ligation     Past Medical History  Diagnosis Date  . Hypertension   . GERD (gastroesophageal reflux disease)   . Allergic rhinitis   . Asthma   . Obesity   . Depression   . History of colonic polyps   . DJD (degenerative joint disease), cervical   . 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 Stroke    BP 138/75  Pulse 73  Resp 14  Ht 5\' 1"  (1.549 m)  Wt 197 lb (89.359 kg)  BMI 37.24 kg/m2  SpO2 98%  Opioid Risk Score:   Fall Risk Score: Low Fall Risk (0-5 points) (pt educated on fall risk, pt given brochure previously)    Review of Systems  Constitutional: Positive for unexpected weight change.  HENT:  Loss of taste or smell   Respiratory: Positive for cough and shortness of breath.   Neurological: Positive for weakness and numbness.  All other systems reviewed and are negative.      Objective:   Physical Exam  Nursing note and vitals reviewed. Constitutional: She is oriented to person, place, and time. She appears well-developed and well-nourished.  HENT:  Head: Normocephalic and atraumatic.  Eyes: Conjunctivae and EOM are normal. Pupils are equal, round, and reactive to light.  Neck: Normal range of motion.  Neurological: She is alert and oriented to person, place, and time. She has normal strength. A sensory deficit is present. She displays a negative Romberg sign. Gait normal.  Naming intact Some word finding defs Speech at sentence level  Psychiatric: She has a normal mood and  affect.    Decreased sensation on the palm of the right hand.  Motor strength is 5-5 in the right upper and right lower and 5/5 in the left upper and left lower  Visual fields are intact to confrontation testing  Tone is normal.  Speech has word finding difficulties, dysarthria  Decreased sensation in the right hand and right leg, pp and LT       Assessment & Plan:  1. left MCA infarct with right hemiparesis now improved with residual aphasia. She is now functionally independent with mild sensory deficits on the right side but not gross motor deficits. Also she has no visual field deficits. I think it is fine for her to drive and I have recommended that for the first month that she not drive on interstates but then may resume  We discussed time course of recovery from stroke and specifically about aphasia. She is likely to experience further improvements in her aphasia over the next 6 months  Patient will followup with neurology Patient will followup with primary care Dr. Cathlean Cower

## 2014-05-14 ENCOUNTER — Encounter: Payer: Self-pay | Admitting: Adult Health

## 2014-05-14 ENCOUNTER — Ambulatory Visit (INDEPENDENT_AMBULATORY_CARE_PROVIDER_SITE_OTHER)
Admission: RE | Admit: 2014-05-14 | Discharge: 2014-05-14 | Disposition: A | Discharge status: Home / Self Care | Payer: 59 | Source: Ambulatory Visit | Attending: Adult Health | Admitting: Adult Health

## 2014-05-14 ENCOUNTER — Telehealth: Payer: Self-pay | Admitting: Internal Medicine

## 2014-05-14 ENCOUNTER — Ambulatory Visit (INDEPENDENT_AMBULATORY_CARE_PROVIDER_SITE_OTHER): Payer: 59 | Admitting: Adult Health

## 2014-05-14 VITALS — BP 112/80 | HR 72 | Temp 97.8°F | Ht 61.0 in | Wt 193.6 lb

## 2014-05-14 DIAGNOSIS — J45909 Unspecified asthma, uncomplicated: Secondary | ICD-10-CM

## 2014-05-14 MED ORDER — LEVALBUTEROL HCL 0.63 MG/3ML IN NEBU
0.6300 mg | INHALATION_SOLUTION | Freq: Once | RESPIRATORY_TRACT | Status: AC
  Administered 2014-05-14: 0.63 mg via RESPIRATORY_TRACT

## 2014-05-14 MED ORDER — AZITHROMYCIN 250 MG PO TABS: ORAL_TABLET | ORAL | Status: AC

## 2014-05-14 MED ORDER — MOMETASONE FURO-FORMOTEROL FUM 100-5 MCG/ACT IN AERO: 2.0000 | INHALATION_SPRAY | Freq: Two times a day (BID) | RESPIRATORY_TRACT | Status: DC

## 2014-05-14 NOTE — Telephone Encounter (Signed)
Called and spoke with pt and she stated that she thinks that she has thrush and her throat does not feel good.  She is having cough with green sputum.  Pt is aware of appt today with TP at 11:45.

## 2014-05-14 NOTE — Patient Instructions (Addendum)
Call back if you do not have Dulera Inhaler at home .  Rinse after inhalers  Follow med calendar closely and bring to each visit.  Zpack take as directed.  Chest xray today .  Mucinex DM Twice daily  As needed  Cough/congestion  Follow up Dr. Melvyn Novas  In 2 months and As needed   Please contact office for sooner follow up if symptoms do not improve or worsen or seek emergency care

## 2014-05-14 NOTE — Assessment & Plan Note (Signed)
Flare with URI   Plan  Call back if you do not have Dulera Inhaler at home .  Rinse after inhalers  Follow med calendar closely and bring to each visit.  Zpack take as directed.  Chest xray today .  Mucinex DM Twice daily  As needed  Cough/congestion  Follow up Dr. Melvyn Novas  In 2 months and As needed   Please contact office for sooner follow up if symptoms do not improve or worsen or seek emergency care

## 2014-05-15 ENCOUNTER — Telehealth: Payer: Self-pay | Admitting: Adult Health

## 2014-05-15 ENCOUNTER — Telehealth: Payer: Self-pay | Admitting: Neurology

## 2014-05-15 NOTE — Telephone Encounter (Signed)
Patient was supposed to scheduled with Andrea Mason , i made that appointment in august.

## 2014-05-15 NOTE — Telephone Encounter (Signed)
Called and spoke with pt and she is aware of cxr results per TP.  Pt voiced her understanding of these results and nothing further is needed.

## 2014-05-15 NOTE — Telephone Encounter (Signed)
Patient was rescheduled from 07/15/2014 to 12/08/2014 (next available established patient) per Dr. Clydene Fake schedule change. Patient was very concerned that her appointment was too far out and asking for a sooner appointment.  She has been added to the wait list.

## 2014-05-19 NOTE — Progress Notes (Signed)
Subjective:     Patient ID: Andrea Mason, female   DOB: August 06, 1951  MRN: [id number]   Brief patient profile:  64 yobf never smoker followed by Dr Annamaria Boots for allergic rhinitis and asthma   History of Present Illness  02/05/2013 1st pulmonary eval in EPIC era baseline = completely better on qvar 80 2bid and only use rescue once a week at baseline then much worse starting 3/3 with cough/ congestion/ green mucus and rescue x one by time of ov at 2pm.  No resting sob, very hoarse with harsh barking cough >Augmentin rx   03/17/2013 Follow up and med calendar   no change rx, use med calendar   04/18/2013  ov/Wert acute w/in no calendar Chief Complaint  Patient presents with  . Acute Visit    Pt c/o wheezing for the past several wks, worse today after being exposed to fragrences. She also c/o cough since last night, non prod.    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 rec Prednisone 10 mg take  4 each am x 2 days,   2 each am x 2 days,  1 each am x2days and stop Only use your albuterol as a rescue    06/03/2013 f/u ov/Wert f/u asthma/ chronic rhinitis Chief Complaint  Patient presents with  . Follow-up    6 week follow up Complains of green mucous   abrupt cough onset x one week, not needing proaire, assoc with nasal congestion but minimal sob. Rec: augmentin/ pred  06/30/13- 40 yobf never smoker followed byDr Melvyn Novas for asthma/ Last saw Dr Annamaria Boots 2012-for allergic rhinitis and asthma  FOLLOW UP:  pt reports she has had a few episodes with sinus infections/URI requing rounds of abx-- would like to discuss restarting allergy vaccine per Dr. Melvyn Novas patient needs allegies more under control She was on allergy vaccine for one or 2 years, stopping 2 years ago-drifted off. Complains of frequent nasal congestion, green nasal discharge treated with prednisone and amoxicillin. Right eye waters. Some sneeze- perennial but worse in spring and fall pollen  seasons. Using Zyrtec and Flonase. Denies headaches, ear pressure or itching.  History of sinus surgery twice by Dr. [name]. CT head 2012-chronic sinus disease    08/21/13-62 yobf never smoker followed by Dr Melvyn Novas for asthma/  Dr Annamaria Boots [phone] allergic rhinitis and asthma  FOLLOWS FOR: Not on Allergy vaccine ; has not got Zpak that was called into pharmacy recently We reviewed skin testing from 2011. She had tried to start allergy vaccine but could not afford it and says that she cannot afford it now. Perennial rhinitis and asthma. Can't afford to fill recent prescription for Zithromax. When she takes Augmentin she says it loosens ropy mucus from her nose. Continues daily Zyrtec, Alvesco and uses rescue inhaler 2 or 3 days per week.  Has seen Dr Ayesha Mohair ENT/ [facility] and Dr [name] ENT..  09/23/13-  67 yobf never smoker followed by Dr Melvyn Novas for asthma/  Dr Annamaria Boots for allergic rhinitis FOLLOWS FOR:has stopped vaccine; still working with insurance about  allergy coverage. Thinks she has a sinus infection. Sneezing and coughing with green from nose. Ears feel full and itch. She did not followup with Dr. Lovenia Shuck rec augmentin > did not take alvesco > ran out of samples and did not fill rx   10/24/2013 f/u ov/Wert re: cough > rhinitis > sob Chief Complaint  Patient presents with  . Acute Visit  Pt c/o sore throat, non prod cough and PND x 1 wk.   also very hoarse. Green discharge from nose in am's esp. No perceived increased need for saba. 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  2. Subcutaneous Lovenox for deep venous thrombosis prophylaxis.  3. Dysphagia.  4.  Hypertension.  5. Acute sinusitis, resolved.  6. Hyperlipidemia.  7. Gastroesophageal reflux disease.  8. Asthma.   02/26/14 MW/ post hosp ov from [facility] doing well but confused again with meds, did not bring med calendar "they've changed my meds" no need for saba on symbicort 80 2bid  rec Just use your symbicort if you have cough shortness or breath or wheeze  See [name] in 4  Weeks for med calendar    03/24/2014 acute  ov/Wert re: asthma flare / no med calendar yet, confused with names of meds/ not sure she's on symbicort and using lots of saba including 3 h prior to [place] Complaint  Patient presents with  . Acute Visit    Pt c/o wheezing for the past several wks- worse at night and unable to sleep.  She also c/o occ cough- prod occ with minimal green sputum.    >>augmentin and steroid taper   04/07/2014 Follow up and Med review  Patient returns for a followup and medication review. We reviewed all her medications and organized them into a patient medication calendar with patient education. Appears, that she's taking her medications correctly.  Patient was seen last with an asthma flare. Last visit, with a URI/sinusitis . She was treated with Augmentin and steroid taper with improvement in symptoms. Patient denies any hemoptysis, fever, orthopnea, PND, or leg swelling. Patient also had thrush and was given Mycelex, which has improved. Her symptoms.   05/14/14 Follow up  Pt c/o increased cough with green mucus, bad taste in mouth, ear itching x 1 week. Pt using Mucinex 1200mg  qd. No fever. pt is very confused with medications--not sure if she is taking the right inhalers--not sure if she has Dulera/Symbicort We reviewed her meds and she is suppose to be on [place]. She denies any hemoptysis, orthopnea, PND, or leg swelling   Current Medications, Allergies, Complete Past Medical History, Past Surgical History, Family History, and Social History were reviewed in [employer] record.     ROS  The following are not active complaints unless bolded sore throat, dysphagia, dental problems, itching, sneezing,    ear ache,   fever, chills, sweats, unintended wt loss, pleuritic or exertional cp, hemoptysis,  orthopnea pnd or leg swelling, presyncope, palpitations, heartburn, abdominal pain, anorexia, nausea, vomiting, diarrhea  or change in bowel or urinary habits, change in stools or urine, dysuria,hematuria,  rash, arthralgias, visual complaints, headache, numbness weakness or ataxia or problems with walking or coordination,  change in mood/affect or memory.      OBJ- Physical Exam   amb wf with expressive aphasia and R arm weakness   General- Alert, Oriented, Affect-appropriate,    \HEENT: nl dentition, turbinates, and orophanx. Nl external ear canals without cough reflex,  no thrush noted    NECK :  without JVD/Nodes/TM/ nl carotid upstrokes bilaterally   LUNGS: no acc muscle use, CTA w/ no wheezing   CV:  RRR  no s3 or murmur or increase in P2, no edema   ABD:  soft and nontender with nl excursion in the supine position. No bruits or organomegaly, bowel sounds nl  MS:  warm without deformities, calf tenderness, cyanosis or clubbing  SKIN: warm and dry without lesions                            Assessment:

## 2014-05-25 ENCOUNTER — Encounter (HOSPITAL_COMMUNITY): Payer: Self-pay | Admitting: Emergency Medicine

## 2014-05-25 ENCOUNTER — Inpatient Hospital Stay (HOSPITAL_COMMUNITY): Payer: 59

## 2014-05-25 ENCOUNTER — Inpatient Hospital Stay (HOSPITAL_COMMUNITY)
Admission: EM | Admit: 2014-05-25 | Discharge: 2014-05-26 | DRG: 074 | Disposition: A | Discharge status: Home / Self Care | Payer: 59 | Attending: Internal Medicine | Admitting: Internal Medicine

## 2014-05-25 ENCOUNTER — Emergency Department (HOSPITAL_COMMUNITY): Payer: 59

## 2014-05-25 DIAGNOSIS — R531 Weakness: Secondary | ICD-10-CM

## 2014-05-25 DIAGNOSIS — M6281 Muscle weakness (generalized): Secondary | ICD-10-CM

## 2014-05-25 DIAGNOSIS — J328 Other chronic sinusitis: Secondary | ICD-10-CM

## 2014-05-25 DIAGNOSIS — M479 Spondylosis, unspecified: Secondary | ICD-10-CM

## 2014-05-25 DIAGNOSIS — I69922 Dysarthria following unspecified cerebrovascular disease: Secondary | ICD-10-CM

## 2014-05-25 DIAGNOSIS — J45909 Unspecified asthma, uncomplicated: Secondary | ICD-10-CM | POA: Diagnosis present

## 2014-05-25 DIAGNOSIS — R209 Unspecified disturbances of skin sensation: Secondary | ICD-10-CM | POA: Diagnosis present

## 2014-05-25 DIAGNOSIS — M129 Arthropathy, unspecified: Secondary | ICD-10-CM

## 2014-05-25 DIAGNOSIS — Z860100 Personal history of colon polyps, unspecified: Secondary | ICD-10-CM

## 2014-05-25 DIAGNOSIS — R609 Edema, unspecified: Secondary | ICD-10-CM

## 2014-05-25 DIAGNOSIS — D259 Leiomyoma of uterus, unspecified: Secondary | ICD-10-CM

## 2014-05-25 DIAGNOSIS — M79602 Pain in left arm: Secondary | ICD-10-CM

## 2014-05-25 DIAGNOSIS — R930 Abnormal findings on diagnostic imaging of skull and head, not elsewhere classified: Secondary | ICD-10-CM

## 2014-05-25 DIAGNOSIS — I69320 Aphasia following cerebral infarction: Secondary | ICD-10-CM

## 2014-05-25 DIAGNOSIS — Z6835 Body mass index (BMI) 35.0-35.9, adult: Secondary | ICD-10-CM

## 2014-05-25 DIAGNOSIS — R200 Anesthesia of skin: Secondary | ICD-10-CM

## 2014-05-25 DIAGNOSIS — K219 Gastro-esophageal reflux disease without esophagitis: Secondary | ICD-10-CM | POA: Diagnosis present

## 2014-05-25 DIAGNOSIS — Z91041 Radiographic dye allergy status: Secondary | ICD-10-CM

## 2014-05-25 DIAGNOSIS — G4730 Sleep apnea, unspecified: Secondary | ICD-10-CM

## 2014-05-25 DIAGNOSIS — Z0000 Encounter for general adult medical examination without abnormal findings: Secondary | ICD-10-CM

## 2014-05-25 DIAGNOSIS — K2100 Gastro-esophageal reflux disease with esophagitis, without bleeding: Secondary | ICD-10-CM

## 2014-05-25 DIAGNOSIS — I69998 Other sequelae following unspecified cerebrovascular disease: Secondary | ICD-10-CM

## 2014-05-25 DIAGNOSIS — J330 Polyp of nasal cavity: Secondary | ICD-10-CM

## 2014-05-25 DIAGNOSIS — Z800 Family history of malignant neoplasm of digestive organs: Secondary | ICD-10-CM

## 2014-05-25 DIAGNOSIS — E876 Hypokalemia: Secondary | ICD-10-CM

## 2014-05-25 DIAGNOSIS — Z7982 Long term (current) use of aspirin: Secondary | ICD-10-CM

## 2014-05-25 DIAGNOSIS — E2609 Other primary hyperaldosteronism: Secondary | ICD-10-CM

## 2014-05-25 DIAGNOSIS — I6330 Cerebral infarction due to thrombosis of unspecified cerebral artery: Secondary | ICD-10-CM | POA: Insufficient documentation

## 2014-05-25 DIAGNOSIS — D179 Benign lipomatous neoplasm, unspecified: Secondary | ICD-10-CM

## 2014-05-25 DIAGNOSIS — F329 Major depressive disorder, single episode, unspecified: Secondary | ICD-10-CM

## 2014-05-25 DIAGNOSIS — R202 Paresthesia of skin: Secondary | ICD-10-CM

## 2014-05-25 DIAGNOSIS — I639 Cerebral infarction, unspecified: Secondary | ICD-10-CM

## 2014-05-25 DIAGNOSIS — R739 Hyperglycemia, unspecified: Secondary | ICD-10-CM

## 2014-05-25 DIAGNOSIS — J302 Other seasonal allergic rhinitis: Secondary | ICD-10-CM

## 2014-05-25 DIAGNOSIS — J3089 Other allergic rhinitis: Secondary | ICD-10-CM

## 2014-05-25 DIAGNOSIS — F3289 Other specified depressive episodes: Secondary | ICD-10-CM

## 2014-05-25 DIAGNOSIS — I10 Essential (primary) hypertension: Secondary | ICD-10-CM | POA: Diagnosis present

## 2014-05-25 DIAGNOSIS — M79601 Pain in right arm: Secondary | ICD-10-CM | POA: Diagnosis present

## 2014-05-25 DIAGNOSIS — E669 Obesity, unspecified: Secondary | ICD-10-CM | POA: Diagnosis present

## 2014-05-25 DIAGNOSIS — M546 Pain in thoracic spine: Secondary | ICD-10-CM

## 2014-05-25 DIAGNOSIS — M5412 Radiculopathy, cervical region: Principal | ICD-10-CM | POA: Diagnosis present

## 2014-05-25 DIAGNOSIS — E785 Hyperlipidemia, unspecified: Secondary | ICD-10-CM

## 2014-05-25 DIAGNOSIS — G4710 Hypersomnia, unspecified: Secondary | ICD-10-CM

## 2014-05-25 LAB — COMPREHENSIVE METABOLIC PANEL
ALT: 12 U/L (ref 0–35)
AST: 16 U/L (ref 0–37)
Albumin: 3.7 g/dL (ref 3.5–5.2)
Alkaline Phosphatase: 103 U/L (ref 39–117)
BILIRUBIN TOTAL: 1.1 mg/dL (ref 0.3–1.2)
BUN: 9 mg/dL (ref 6–23)
CHLORIDE: 104 meq/L (ref 96–112)
CO2: 24 meq/L (ref 19–32)
Calcium: 9.7 mg/dL (ref 8.4–10.5)
Creatinine, Ser: 0.72 mg/dL (ref 0.50–1.10)
GFR calc non Af Amer: >90 mL/min (ref >90)
GLUCOSE: 102 mg/dL — AB (ref 70–99)
POTASSIUM: 3.4 meq/L — AB (ref 3.7–5.3)
Sodium: 142 mEq/L (ref 137–147)
Total Protein: 7.5 g/dL (ref 6.0–8.3)

## 2014-05-25 LAB — RAPID URINE DRUG SCREEN, HOSP PERFORMED
AMPHETAMINES: NOT DETECTED
Barbiturates: NOT DETECTED
Benzodiazepines: NOT DETECTED
Cocaine: NOT DETECTED
OPIATES: NOT DETECTED
TETRAHYDROCANNABINOL: NOT DETECTED

## 2014-05-25 LAB — URINALYSIS, ROUTINE W REFLEX MICROSCOPIC
Bilirubin Urine: NEGATIVE
GLUCOSE, UA: NEGATIVE mg/dL
HGB URINE DIPSTICK: NEGATIVE
Ketones, ur: NEGATIVE mg/dL
Leukocytes, UA: NEGATIVE
Nitrite: NEGATIVE
PROTEIN: NEGATIVE mg/dL
Specific Gravity, Urine: 1.006 (ref 1.005–1.030)
Urobilinogen, UA: 0.2 mg/dL (ref 0.0–1.0)
pH: 6.5 (ref 5.0–8.0)

## 2014-05-25 LAB — PROTIME-INR
INR: 1.07 (ref 0.00–1.49)
PROTHROMBIN TIME: 13.7 s (ref 11.6–15.2)

## 2014-05-25 LAB — GLUCOSE, CAPILLARY: GLUCOSE-CAPILLARY: 123 mg/dL — AB (ref 70–99)

## 2014-05-25 LAB — HEMOGLOBIN A1C
HEMOGLOBIN A1C: 5.8 % — AB (ref <5.7)
MEAN PLASMA GLUCOSE: 120 mg/dL — AB (ref <117)

## 2014-05-25 LAB — CBC
HEMATOCRIT: 36.3 % (ref 36.0–46.0)
Hemoglobin: 12 g/dL (ref 12.0–15.0)
MCH: 28.6 pg (ref 26.0–34.0)
MCHC: 33.1 g/dL (ref 30.0–36.0)
MCV: 86.6 fL (ref 78.0–100.0)
Platelets: 200 10*3/uL (ref 150–400)
RBC: 4.19 MIL/uL (ref 3.87–5.11)
RDW: 13.1 % (ref 11.5–15.5)
WBC: 6.3 10*3/uL (ref 4.0–10.5)

## 2014-05-25 LAB — APTT: aPTT: 26 seconds (ref 24–37)

## 2014-05-25 LAB — TROPONIN I

## 2014-05-25 MED ORDER — ACETAMINOPHEN 325 MG PO TABS: 650.0000 mg | ORAL_TABLET | ORAL | Status: DC | PRN

## 2014-05-25 MED ORDER — SIMVASTATIN 20 MG PO TABS
20.0000 mg | ORAL_TABLET | Freq: Every day | ORAL | Status: DC
  Administered 2014-05-25 – 2014-05-26 (×2): 20 mg via ORAL
  Filled 2014-05-25 (×2): qty 1

## 2014-05-25 MED ORDER — MONTELUKAST SODIUM 10 MG PO TABS
10.0000 mg | ORAL_TABLET | Freq: Every day | ORAL | Status: DC
  Administered 2014-05-25: 10 mg via ORAL
  Filled 2014-05-25 (×2): qty 1

## 2014-05-25 MED ORDER — ASPIRIN 325 MG PO TABS
325.0000 mg | ORAL_TABLET | Freq: Every day | ORAL | Status: DC
  Administered 2014-05-25 – 2014-05-26 (×2): 325 mg via ORAL
  Filled 2014-05-25 (×2): qty 1

## 2014-05-25 MED ORDER — ALBUTEROL SULFATE HFA 108 (90 BASE) MCG/ACT IN AERS: 2.0000 | INHALATION_SPRAY | Freq: Four times a day (QID) | RESPIRATORY_TRACT | Status: DC | PRN

## 2014-05-25 MED ORDER — MOMETASONE FURO-FORMOTEROL FUM 100-5 MCG/ACT IN AERO
2.0000 | INHALATION_SPRAY | Freq: Two times a day (BID) | RESPIRATORY_TRACT | Status: DC
  Administered 2014-05-25 – 2014-05-26 (×2): 2 via RESPIRATORY_TRACT
  Filled 2014-05-25: qty 8.8

## 2014-05-25 MED ORDER — PANTOPRAZOLE SODIUM 40 MG PO TBEC
40.0000 mg | DELAYED_RELEASE_TABLET | Freq: Every day | ORAL | Status: DC
  Administered 2014-05-26: 40 mg via ORAL
  Filled 2014-05-25: qty 1

## 2014-05-25 MED ORDER — ENOXAPARIN SODIUM 40 MG/0.4ML ~~LOC~~ SOLN
40.0000 mg | Freq: Every day | SUBCUTANEOUS | Status: DC
  Administered 2014-05-25 – 2014-05-26 (×2): 40 mg via SUBCUTANEOUS
  Filled 2014-05-25 (×2): qty 0.4

## 2014-05-25 MED ORDER — OXYMETAZOLINE HCL 0.05 % NA SOLN
2.0000 | Freq: Two times a day (BID) | NASAL | Status: DC | PRN
  Filled 2014-05-25: qty 15

## 2014-05-25 MED ORDER — FLUTICASONE PROPIONATE 50 MCG/ACT NA SUSP
2.0000 | Freq: Two times a day (BID) | NASAL | Status: DC
  Administered 2014-05-25 – 2014-05-26 (×2): 2 via NASAL
  Filled 2014-05-25 (×2): qty 16

## 2014-05-25 MED ORDER — OXYBUTYNIN CHLORIDE 5 MG PO TABS
5.0000 mg | ORAL_TABLET | Freq: Every day | ORAL | Status: DC
  Administered 2014-05-25: 5 mg via ORAL
  Filled 2014-05-25 (×2): qty 1

## 2014-05-25 MED ORDER — LORAZEPAM 2 MG/ML IJ SOLN
1.0000 mg | Freq: Once | INTRAMUSCULAR | Status: AC
  Administered 2014-05-25: 1 mg via INTRAVENOUS
  Filled 2014-05-25: qty 1

## 2014-05-25 MED ORDER — ALBUTEROL SULFATE (2.5 MG/3ML) 0.083% IN NEBU: 2.5000 mg | INHALATION_SOLUTION | Freq: Four times a day (QID) | RESPIRATORY_TRACT | Status: DC | PRN

## 2014-05-25 MED ORDER — LORATADINE 10 MG PO TABS
10.0000 mg | ORAL_TABLET | Freq: Every day | ORAL | Status: DC
  Administered 2014-05-25 – 2014-05-26 (×2): 10 mg via ORAL
  Filled 2014-05-25 (×2): qty 1

## 2014-05-25 NOTE — Progress Notes (Signed)
This RN called to get report.  RN not available, told to call back in 5 minutes.

## 2014-05-25 NOTE — ED Notes (Signed)
Patient transported to CT 

## 2014-05-25 NOTE — Progress Notes (Signed)
Chaplain Note: Chaplain responded to patient's request for an Advanced Directive. Patient reports that she may have an AD on file from a previous stay at another location. Chaplain left a copy with patient and encouraged her to check hospital records as well as the family members she feels may have filed the same or similar document in the past. Possible follow up.  Andrea Mason, [place]

## 2014-05-25 NOTE — H&P (Signed)
[facility] History and Physical  Andrea Mason YTK:[id number] DOB: 1951/10/11 DOA: 05/25/2014  Referring physician: EDP PCP: Andrea Cower, MD   Chief Complaint: R arm pain and R leg tightness  HPI: Andrea Mason is a 63 y.o. 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 She also has PMH of HTN, Asthma,  Allergic rhinitis, obesity, Depression. She has been on ASA for secondary CVA prophylaxis. She reports waking up this am around 4 am with severe pain going down her R arm with numbness and tightness of her R leg. She is a rather poor historian She denies any new weakness, was concerned about her symptoms and hence presented to the ER, she reports that her Symptoms are improving since admission, she speech is the same as before. She denies any new medications or changes in her medications In ER, Workup negative, CT head shows old CVA, lab unrevealing  Review of Systems: positives bolded Constitutional:  No weight loss, night sweats, Fevers, chills, fatigue.  HEENT:  No headaches, Difficulty swallowing,Tooth/dental problems,Sore throat,  No sneezing, itching, ear ache, nasal congestion, post nasal drip,  Cardio-vascular:  No chest pain, Orthopnea, PND, swelling in lower extremities, anasarca, dizziness, palpitations  GI:  No heartburn, indigestion, abdominal pain, nausea, vomiting, diarrhea, change in bowel habits, loss of appetite  Resp:  No shortness of breath with exertion or at rest. No excess mucus, no productive cough, No non-productive cough, No coughing up of blood.No change in color of mucus.No wheezing.No chest wall deformity  Skin:  no rash or lesions.  GU:  no dysuria, change in color of urine, no urgency or frequency. No flank pain.  Musculoskeletal:  R arm pain and R leg tightness as above Psych:  No change in mood or  affect. No depression or anxiety. No memory loss.   Past Medical History  Diagnosis Date  . Hypertension   . GERD (gastroesophageal reflux disease)   . Allergic rhinitis   . Asthma   . Obesity   . Depression   . History of colonic polyps   . DJD (degenerative joint disease), cervical   . 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 Stroke    Past Surgical History  Procedure Laterality Date  . Nasal sinus surgery  07/2006    Dr. [name]  . Nasal polyp surgery  07/2006    x 2 with Dr. [name]  . Foot surgery  1998  . Tubal ligation     Social History:  reports that she has never smoked. She has never used smokeless tobacco. She reports that she does not drink alcohol or use illicit drugs.  Allergies  Allergen Reactions  . Ivp Dye [Iodinated Diagnostic Agents] Nausea And Vomiting    Reaction: hot flashes  . Promethazine-Codeine Nausea And Vomiting  . Tramadol     Family History  Problem Relation Age of Onset  . Colon cancer Brother      Prior to Admission medications   Medication Sig Start Date End Date Taking? Authorizing Xylah Early  albuterol (PROAIR HFA) 108 (90 BASE) MCG/ACT inhaler Inhale 2 puffs into the lungs every 6 (six) hours as needed for wheezing. 2 puffs every 4 hours as needed only  if your can't catch your breath 12/10/13  Yes Daniel J Angiulli, PA-C  amLODipine (NORVASC) 5 MG tablet Take 1 tablet (5 mg total) by mouth daily. 12/26/13 12/26/14 Yes  Biagio Borg, MD  aspirin 81 MG tablet Take 324 mg by mouth daily.    Yes Historical Henson Fraticelli, MD  Biotin 5000 MCG CAPS Take 1 capsule by mouth daily.   Yes Historical Dara Camargo, MD  cetirizine (ZYRTEC) 10 MG tablet Take 10 mg by mouth daily.    Yes Historical Xian Alves, MD  clotrimazole (MYCELEX) 10 MG troche Take 10 mg by mouth 4 (four) times daily as needed (thrush). 04/06/14  Yes Tanda Rockers, MD  dextromethorphan-guaiFENesin [facility] DM) 30-600 MG per 12 hr tablet Take 2 tablets by mouth daily.    Yes Historical  Sahas Sluka, MD  fluticasone (FLONASE) 50 MCG/ACT nasal spray Place 2 sprays into both nostrils 2 (two) times daily. 12/10/13  Yes Daniel J Angiulli, PA-C  mometasone-formoterol (DULERA) 100-5 MCG/ACT AERO Inhale 2 puffs into the lungs 2 (two) times daily. 05/14/14  Yes [name] S Parrett, NP  montelukast (SINGULAIR) 10 MG tablet Take 1 tablet (10 mg total) by mouth at bedtime. 12/10/13  Yes Daniel J Angiulli, PA-C  omeprazole (PRILOSEC) 20 MG capsule Take 20 mg by mouth every morning.   Yes Historical Nycole Kawahara, MD  oxybutynin (DITROPAN) 5 MG tablet Take 5 mg by mouth at bedtime.   Yes Historical Amylia Collazos, MD  oxymetazoline (AFRIN) 0.05 % nasal spray Place 2 sprays into both nostrils 2 (two) times daily as needed for congestion.   Yes Historical Lianette Broussard, MD  pravastatin (PRAVACHOL) 80 MG tablet Take 80 mg by mouth at bedtime.   Yes Historical Rex Oesterle, MD  ranitidine (ZANTAC) 150 MG tablet Take 150 mg by mouth at bedtime as needed for heartburn.   Yes Historical Allani Reber, MD  Respiratory Therapy Supplies (FLUTTER) DEVI Use as directed. 04/07/14  Yes [name] S Parrett, NP  spironolactone (ALDACTONE) 25 MG tablet Take 25 mg by mouth daily.   Yes Historical Fadil Macmaster, MD   Physical Exam: Filed Vitals:   05/25/14 1127  BP: 138/75  Pulse: 60  Temp: 97.9 F (36.6 C)  Resp: 20    BP 138/75  Pulse 60  Temp(Src) 97.9 F (36.6 C) (Oral)  Resp 20  Ht 5\' 1"  (1.549 m)  Wt 84 kg (185 lb 3 oz)  BMI 35.01 kg/m2  SpO2 99%  General:  Appears calm and comfortable, Dysarthric, [place] sounding accent Eyes: PERRL, normal lids, irises & conjunctiva ENT: grossly normal hearing, lips & tongue Neck: no LAD, masses or thyromegaly Cardiovascular: RRR, no m/r/g. No LE edema. Respiratory: CTA bilaterally, no w/r/r. Normal respiratory effort. Abdomen: soft, ntnd Skin: no rash or induration seen on limited exam Psychiatric: grossly normal mood and affect, speech fluent and appropriate Neurologic: motor RLE 4/5, rest  5/5,  Sensory: decreased light touch in RUE and RLE Plantars: withdrawal DTRs: brisk: 3plus          Labs on Admission:  Basic Metabolic Panel:  Recent Labs Lab 05/25/14 0857  NA 142  K 3.4*  CL 104  CO2 24  GLUCOSE 102*  BUN 9  CREATININE 0.72  CALCIUM 9.7   Liver Function Tests:  Recent Labs Lab 05/25/14 0857  AST 16  ALT 12  ALKPHOS 103  BILITOT 1.1  PROT 7.5  ALBUMIN 3.7   No results found for this basename: LIPASE, AMYLASE,  in the last 168 hours No results found for this basename: AMMONIA,  in the last 168 hours CBC:  Recent Labs Lab 05/25/14 0857  WBC 6.3  HGB 12.0  HCT 36.3  MCV 86.6  PLT 200   Cardiac  Enzymes:  Recent Labs Lab 05/25/14 0857  TROPONINI <0.30    BNP (last 3 results) No results found for this basename: PROBNP,  in the last 8760 hours CBG: No results found for this basename: GLUCAP,  in the last 168 hours  Radiological Exams on Admission: Ct Head Wo Contrast  05/25/2014   CLINICAL DATA:  Right lower extremity pain.  EXAM: CT HEAD WITHOUT CONTRAST  TECHNIQUE: Contiguous axial images were obtained from the base of the skull through the vertex without intravenous contrast.  COMPARISON:  MRI scan of November 21, 2013; CT scan of head of November 20, 2013.  FINDINGS: Pansinusitis is again noted. Bony calvarium is otherwise intact. Encephalomalacia of left posterior parietal cortex is noted consistent with old infarction. No mass effect or midline shift is noted. Ventricular size is within normal limits. There is no evidence of mass lesion, hemorrhage or acute infarction.  IMPRESSION: Pansinusitis is again noted. Old left posterior parietal infarction is noted. No acute intracranial abnormality seen.   Electronically Signed   By: Sabino Dick M.D.   On: 05/25/2014 09:50    EKG: Independently reviewed. pending  Assessment/Plan 1. R arm pain/numbness and some paresthesias in R leg, some worsening from baseline per history from Recent  CVA -Improving, r/o TIA vs CVA extension  -Neuro consulted per EDP -COntinue ASA 325mg  -check MRI brain -2D ECHo: EF of 55% without valvular disease in 12/14 -Carotid duplex with 1-39% ICA stenosis bilaterally -continue Statin  2. HTN -stable, will hold Norvasc and aldactone to allow for permissible HTN pending CVA workup  3. Asthma/allergies -stable, continue home meds  DVT proph: lovenox  Code Status: Full COde Family Communication: none at bedside Disposition Plan: home pending workup  Time spent: 5min  JOSEPH,PREETHA [facility] Pager [phone] **Disclaimer: This note may have been dictated with voice recognition software. Similar sounding words can inadvertently be transcribed and this note may contain transcription errors which may not have been corrected upon publication of note.**

## 2014-05-25 NOTE — ED Provider Notes (Signed)
CSN: [id number]     Arrival date & time 05/25/14  0746 History   First MD Initiated Contact with Patient 05/25/14 0755     No chief complaint on file.    (Consider location/radiation/quality/duration/timing/severity/associated sxs/prior Treatment) HPI Comments: The patient is a 63-year-old female past medical history of stroke (left insular cortex infarct, deficits include right sided sensory deficits, and dysarthria, and residual aphasia), hypertension, obesity presents emergency room chief complaint of right sided arm pain since approximately 4 to 5 PM. The patient reports waking up due to the pain in her right upper treatment A. She reports shortly after onset of tightness in her right lower extremity. At baseline the patient walks without a walker. Denis history of PE/DVT.  The history is provided by the patient and medical records. No language interpreter was used.    Past Medical History  Diagnosis Date  . Hypertension   . GERD (gastroesophageal reflux disease)   . Allergic rhinitis   . Asthma   . Obesity   . Depression   . History of colonic polyps   . DJD (degenerative joint disease), cervical   . 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 Stroke    Past Surgical History  Procedure Laterality Date  . Nasal sinus surgery  07/2006    Dr. [name]  . Nasal polyp surgery  07/2006    x 2 with Dr. [name]  . Foot surgery  1998  . Tubal ligation     Family History  Problem Relation Age of Onset  . Colon cancer Brother    History  Substance Use Topics  . Smoking status: Never Smoker   . Smokeless tobacco: Never Used  . Alcohol Use: No   OB History   Grav Para Term Preterm Abortions TAB SAB Ect Mult Living                 Review of Systems  Constitutional: Negative for fever and chills.  Gastrointestinal: Negative for abdominal pain.  Neurological: Negative for headaches.      Allergies  Ivp dye; Promethazine-codeine; and Tramadol  Home Medications   Prior  to Admission medications   Medication Sig Start Date End Date Taking? Authorizing Provider  albuterol (PROAIR HFA) 108 (90 BASE) MCG/ACT inhaler Inhale 2 puffs into the lungs every 6 (six) hours as needed for wheezing. 2 puffs every 4 hours as needed only  if your can't catch your breath 12/10/13   Lavon Paganini Angiulli, PA-C  amLODipine (NORVASC) 5 MG tablet Take 1 tablet (5 mg total) by mouth daily. 12/26/13 12/26/14  Biagio Borg, MD  aspirin 81 MG tablet Take 81 mg by mouth daily.    Historical Provider, MD  Biotin 5000 MCG CAPS Take 1 capsule by mouth daily.    Historical Provider, MD  cetirizine (ZYRTEC) 10 MG tablet Take 10 mg by mouth daily.     Historical Provider, MD  clotrimazole (MYCELEX) 10 MG troche Take 1 tablet (10 mg total) by mouth 4 (four) times daily as needed. 04/06/14   Tanda Rockers, MD  dextromethorphan-guaiFENesin [facility] DM) 30-600 MG per 12 hr tablet Take 2 tablets by mouth daily.     Historical Provider, MD  fluticasone (FLONASE) 50 MCG/ACT nasal spray Place 2 sprays into both nostrils 2 (two) times daily. 12/10/13   Daniel J Angiulli, PA-C  mometasone-formoterol (DULERA) 100-5 MCG/ACT AERO Inhale 2 puffs into the lungs 2 (two) times daily. 05/14/14   [name] S Parrett, NP  montelukast (SINGULAIR) 10 MG tablet  Take 1 tablet (10 mg total) by mouth at bedtime. 12/10/13   Lavon Paganini Angiulli, PA-C  omeprazole (PRILOSEC) 20 MG capsule Take 20 mg by mouth every morning.    Historical Provider, MD  ranitidine (ZANTAC) 150 MG tablet Take 150 mg by mouth at bedtime as needed for heartburn.    Historical Provider, MD  Respiratory Therapy Supplies (FLUTTER) DEVI Use as directed. 04/07/14   [name] S Parrett, NP  spironolactone (ALDACTONE) 25 MG tablet Take 25 mg by mouth daily.    Historical Provider, MD   There were no vitals taken for this visit. Physical Exam  Nursing note and vitals reviewed. Constitutional: She is oriented to person, place, and time. She appears well-developed and  well-nourished.  Non-toxic appearance. She does not have a sickly appearance. She does not appear ill. No distress.  HENT:  Head: Normocephalic and atraumatic.  Neck: Normal range of motion. Neck supple.  Cardiovascular: Normal rate and regular rhythm.   Pulses:      Dorsalis pedis pulses are 2+ on the right side, and 2+ on the left side.  No lower extremity edema, no pain in calf.  Pulmonary/Chest: Effort normal and breath sounds normal. No respiratory distress. She has no wheezes.  Abdominal: Soft. Bowel sounds are normal.  Musculoskeletal:  Moves all 4 extremitties  Neurological: She is alert and oriented to person, place, and time. A sensory deficit is present. She exhibits abnormal muscle tone. GCS eye subscore is 4. GCS verbal subscore is 5. GCS motor subscore is 6.  Right sided upper extremity and lower extremity sensation deficit. Right upper extremity 4/5 and lower extremity 4/5 weakness.  Mild word searching. Speech is clear. No obvious facial droop.  Skin: Skin is warm and dry. No rash noted. She is not diaphoretic.  Psychiatric: She has a normal mood and affect. Her behavior is normal.    ED Course  Procedures (including critical care time) Labs Review Results for orders placed during the hospital encounter of 05/25/14  TROPONIN I      Result Value Ref Range   Troponin I <0.30  <0.30 ng/mL  CBC      Result Value Ref Range   WBC 6.3  4.0 - 10.5 K/uL   RBC 4.19  3.87 - 5.11 MIL/uL   Hemoglobin 12.0  12.0 - 15.0 g/dL   HCT 36.3  36.0 - 46.0 %   MCV 86.6  78.0 - 100.0 fL   MCH 28.6  26.0 - 34.0 pg   MCHC 33.1  30.0 - 36.0 g/dL   RDW 13.1  11.5 - 15.5 %   Platelets 200  150 - 400 K/uL  COMPREHENSIVE METABOLIC PANEL      Result Value Ref Range   Sodium 142  137 - 147 mEq/L   Potassium 3.4 (*) 3.7 - 5.3 mEq/L   Chloride 104  96 - 112 mEq/L   CO2 24  19 - 32 mEq/L   Glucose, Bld 102 (*) 70 - 99 mg/dL   BUN 9  6 - 23 mg/dL   Creatinine, Ser 0.72  0.50 - 1.10 mg/dL    Calcium 9.7  8.4 - 10.5 mg/dL   Total Protein 7.5  6.0 - 8.3 g/dL   Albumin 3.7  3.5 - 5.2 g/dL   AST 16  0 - 37 U/L   ALT 12  0 - 35 U/L   Alkaline Phosphatase 103  39 - 117 U/L   Total Bilirubin 1.1  0.3 - 1.2 mg/dL  GFR calc non Af Amer >90  >90 mL/min   GFR calc Af Amer >90  >90 mL/min  PROTIME-INR      Result Value Ref Range   Prothrombin Time 13.7  11.6 - 15.2 seconds   INR 1.07  0.00 - 1.49  APTT      Result Value Ref Range   aPTT 26  24 - 37 seconds  URINALYSIS, ROUTINE W REFLEX MICROSCOPIC      Result Value Ref Range   Color, Urine YELLOW  YELLOW   APPearance CLEAR  CLEAR   Specific Gravity, Urine 1.006  1.005 - 1.030   pH 6.5  5.0 - 8.0   Glucose, UA NEGATIVE  NEGATIVE mg/dL   Hgb urine dipstick NEGATIVE  NEGATIVE   Bilirubin Urine NEGATIVE  NEGATIVE   Ketones, ur NEGATIVE  NEGATIVE mg/dL   Protein, ur NEGATIVE  NEGATIVE mg/dL   Urobilinogen, UA 0.2  0.0 - 1.0 mg/dL   Nitrite NEGATIVE  NEGATIVE   Leukocytes, UA NEGATIVE  NEGATIVE  URINE RAPID DRUG SCREEN (HOSP PERFORMED)      Result Value Ref Range   Opiates NONE DETECTED  NONE DETECTED   Cocaine NONE DETECTED  NONE DETECTED   Benzodiazepines NONE DETECTED  NONE DETECTED   Amphetamines NONE DETECTED  NONE DETECTED   Tetrahydrocannabinol NONE DETECTED  NONE DETECTED   Barbiturates NONE DETECTED  NONE DETECTED   Ct Head Wo Contrast  05/25/2014   CLINICAL DATA:  Right lower extremity pain.  EXAM: CT HEAD WITHOUT CONTRAST  TECHNIQUE: Contiguous axial images were obtained from the base of the skull through the vertex without intravenous contrast.  COMPARISON:  MRI scan of November 21, 2013; CT scan of head of November 20, 2013.  FINDINGS: Pansinusitis is again noted. Bony calvarium is otherwise intact. Encephalomalacia of left posterior parietal cortex is noted consistent with old infarction. No mass effect or midline shift is noted. Ventricular size is within normal limits. There is no evidence of mass lesion,  hemorrhage or acute infarction.  IMPRESSION: Pansinusitis is again noted. Old left posterior parietal infarction is noted. No acute intracranial abnormality seen.   Electronically Signed   By: Sabino Dick M.D.   On: 05/25/2014 09:50     Date: 05/25/2014  Rate:63  Rhythm: normal sinus rhythm  QRS Axis: normal  Intervals: normal  ST/T Wave abnormalities: normal  Conduction Disutrbances:none  Narrative Interpretation: Abnormal R-wave progression, early transition  Old EKG Reviewed:       MDM   Final diagnoses:  Weakness of right side of body   Patient with a history of CVA presents with right upper arm pain and right lower extremity tightness. Physical exam shows a normal sensation in right upper and right lower extremity, as well as weakness. Dr. Aline Brochure also evaluated the patient during this encounter and advises TIA workup due to neurologic symptoms no other objective findings on his exam. CT without acute findings, shows previous infarct. Discussed patient history, condition with Dr. Broadus John, hospitalist who agrees to admit the patient for further evaluation of symptoms. Advises consult the neurologist for further evaluation of neurologic symptoms.    Lorrine Kin, PA-C 05/25/14 [phone]

## 2014-05-25 NOTE — ED Notes (Signed)
Pt reports R arm & R leg "tightness" & pain onset yesterday, pt reports hx of R sided numbness, new onset pain, pt A&O x4, follows commands, speaks in complete sentences, denies SOB & CP

## 2014-05-25 NOTE — ED Provider Notes (Signed)
Medical screening examination/treatment/procedure(s) were conducted as a shared visit with non-physician practitioner(s) and myself.  I personally evaluated the patient during the encounter.   EKG Interpretation None      I interviewed and examined the patient. Lungs are CTAB. Cardiac exam wnl. Abdomen soft. Pt here w/ RUE/RLE pain/tightness. Also noting altered sensation and subjective weakness. Will workup as TIA d/t stroke hx.   Blanchard Kelch, MD 05/25/14 1924

## 2014-05-26 DIAGNOSIS — I6330 Cerebral infarction due to thrombosis of unspecified cerebral artery: Secondary | ICD-10-CM | POA: Insufficient documentation

## 2014-05-26 LAB — LIPID PANEL
Cholesterol: 85 mg/dL (ref 0–200)
HDL: 57 mg/dL (ref >39)
LDL CALC: 19 mg/dL (ref 0–99)
Total CHOL/HDL Ratio: 1.5 RATIO
Triglycerides: 43 mg/dL (ref <150)
VLDL: 9 mg/dL (ref 0–40)

## 2014-05-26 MED ORDER — POTASSIUM CHLORIDE CRYS ER 20 MEQ PO TBCR
40.0000 meq | EXTENDED_RELEASE_TABLET | Freq: Once | ORAL | Status: AC
  Administered 2014-05-26: 40 meq via ORAL
  Filled 2014-05-26: qty 2

## 2014-05-26 MED ORDER — ASPIRIN 325 MG PO TABS: 325.0000 mg | ORAL_TABLET | Freq: Every day | ORAL | Status: DC

## 2014-05-26 MED ORDER — GABAPENTIN 100 MG PO CAPS: 100.0000 mg | ORAL_CAPSULE | Freq: Three times a day (TID) | ORAL | Status: DC

## 2014-05-26 NOTE — Discharge Summary (Signed)
Andrea Mason, is a 63 y.o. female  DOB December 07, 1950  MRN [id number].  Admission date:  05/25/2014  Admitting Physician  Domenic Polite, MD  Discharge Date:  05/26/2014   Primary MD  Cathlean Cower, MD  Recommendations for primary care physician for things to follow:   Monitor second it is factors for stroke   Admission Diagnosis  Weakness of right side of body [728.87]   Discharge Diagnosis  neuropathic pain on the right side of the body, question weakness initially but denies that weakness was ever an issue.  Active Problems:   Right arm pain   Numbness in right leg   Numbness and tingling of right arm   Cerebral thrombosis with cerebral infarction      Past Medical History  Diagnosis Date  . Hypertension   . GERD (gastroesophageal reflux disease)   . Allergic rhinitis   . Asthma   . Obesity   . Depression   . History of colonic polyps   . DJD (degenerative joint disease), cervical   . 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 Stroke     Past Surgical History  Procedure Laterality Date  . Nasal sinus surgery  07/2006    Dr. [name]  . Nasal polyp surgery  07/2006    x 2 with Dr. [name]  . Foot surgery  1998  . Tubal ligation       Discharge Condition: Stable   Follow UP  Follow-up Information   Follow up with Cathlean Cower, MD. Schedule an appointment as soon as possible for a visit in 1 week.   Specialties:  Internal Medicine, Radiology   Contact information:   [address] [place] [address] [phone]       Follow up with Forbes Cellar, MD. Schedule an appointment as soon as possible for a visit in 1 week.   Specialties:  Neurology, Radiology   Contact information:   [address] [place]  [address] [phone]         Discharge Instructions  and  Discharge  Medications    Discharge Instructions   Diet - low sodium heart healthy    Complete by:  As directed      Discharge instructions    Complete by:  As directed   Follow with Primary MD Cathlean Cower, MD in 7 days   Get CBC, CMP checked  by Primary MD next visit.    Activity: As tolerated with Full fall precautions use walker/cane & assistance as needed   Disposition Home     Diet: Heart Healthy    For Heart failure patients - Check your Weight same time everyday, if you gain over 2 pounds, or you develop in leg swelling, experience more shortness of breath or chest pain, call your Primary MD immediately. Follow Cardiac Low Salt Diet and 1.8 lit/day fluid restriction.   On your next visit with her primary care physician please Get Medicines reviewed and adjusted.  Please request your Prim.MD to go over all [facility]  Tests and Procedure/Radiological results at the follow up, please get all Hospital records sent to your Prim MD by signing hospital release before you go home.   If you experience worsening of your admission symptoms, develop shortness of breath, life threatening emergency, suicidal or homicidal thoughts you must seek medical attention immediately by calling 911 or calling your MD immediately  if symptoms less severe.  You Must read complete instructions/literature along with all the possible adverse reactions/side effects for all the Medicines you take and that have been prescribed to you. Take any new Medicines after you have completely understood and accpet all the possible adverse reactions/side effects.   Do not drive and provide baby sitting services if your were admitted for syncope or siezures until you have seen by Primary MD or a Neurologist and advised to do so again.  Do not drive when taking Pain medications.    Do not take more than prescribed Pain, Sleep and Anxiety Medications  Special Instructions: If you have smoked or chewed Tobacco  in the last 2 yrs  please stop smoking, stop any regular Alcohol  and or any Recreational drug use.  Wear Seat belts while driving.   Please note  You were cared for by a hospitalist during your hospital stay. If you have any questions about your discharge medications or the care you received while you were in the hospital after you are discharged, you can call the unit and asked to speak with the hospitalist on call if the hospitalist that took care of you is not available. Once you are discharged, your primary care physician will handle any further medical issues. Please note that NO REFILLS for any discharge medications will be authorized once you are discharged, as it is imperative that you return to your primary care physician (or establish a relationship with a primary care physician if you do not have one) for your aftercare needs so that they can reassess your need for medications and monitor your lab values.  Follow with Primary MD Cathlean Cower, MD in 7 days   Get CBC, CMP checked  by Primary MD next visit.    Activity: As tolerated with Full fall precautions use walker/cane & assistance as needed   Disposition Home     Diet: Heart Healthy    For Heart failure patients - Check your Weight same time everyday, if you gain over 2 pounds, or you develop in leg swelling, experience more shortness of breath or chest pain, call your Primary MD immediately. Follow Cardiac Low Salt Diet and 1.8 lit/day fluid restriction.   On your next visit with her primary care physician please Get Medicines reviewed and adjusted.  Please request your Prim.MD to go over all Hospital Tests and Procedure/Radiological results at the follow up, please get all Hospital records sent to your Prim MD by signing hospital release before you go home.   If you experience worsening of your admission symptoms, develop shortness of breath, life threatening emergency, suicidal or homicidal thoughts you must seek medical attention  immediately by calling 911 or calling your MD immediately  if symptoms less severe.  You Must read complete instructions/literature along with all the possible adverse reactions/side effects for all the Medicines you take and that have been prescribed to you. Take any new Medicines after you have completely understood and accpet all the possible adverse reactions/side effects.   Do not drive and provide baby sitting services if your were admitted for syncope or siezures until you  have seen by Primary MD or a Neurologist and advised to do so again.  Do not drive when taking Pain medications.    Do not take more than prescribed Pain, Sleep and Anxiety Medications  Special Instructions: If you have smoked or chewed Tobacco  in the last 2 yrs please stop smoking, stop any regular Alcohol  and or any Recreational drug use.  Wear Seat belts while driving.   Please note  You were cared for by a hospitalist during your hospital stay. If you have any questions about your discharge medications or the care you received while you were in the hospital after you are discharged, you can call the unit and asked to speak with the hospitalist on call if the hospitalist that took care of you is not available. Once you are discharged, your primary care physician will handle any further medical issues. Please note that NO REFILLS for any discharge medications will be authorized once you are discharged, as it is imperative that you return to your primary care physician (or establish a relationship with a primary care physician if you do not have one) for your aftercare needs so that they can reassess your need for medications and monitor your lab values.     Increase activity slowly    Complete by:  As directed             Medication List         albuterol 108 (90 BASE) MCG/ACT inhaler  Commonly known as:  PROAIR HFA  Inhale 2 puffs into the lungs every 6 (six) hours as needed for wheezing. 2 puffs every 4  hours as needed only  if your can't catch your breath     amLODipine 5 MG tablet  Commonly known as:  NORVASC  Take 1 tablet (5 mg total) by mouth daily.     aspirin 325 MG tablet  Take 1 tablet (325 mg total) by mouth daily.     Biotin 5000 MCG Caps  Take 1 capsule by mouth daily.     cetirizine 10 MG tablet  Commonly known as:  ZYRTEC  Take 10 mg by mouth daily.     clotrimazole 10 MG troche  Commonly known as:  MYCELEX  Take 10 mg by mouth 4 (four) times daily as needed (thrush).     dextromethorphan-guaiFENesin 30-600 MG per 12 hr tablet  Commonly known as:  MUCINEX DM  Take 2 tablets by mouth daily.     fluticasone 50 MCG/ACT nasal spray  Commonly known as:  FLONASE  Place 2 sprays into both nostrils 2 (two) times daily.     FLUTTER Devi  Use as directed.     gabapentin 100 MG capsule  Commonly known as:  NEURONTIN  Take 1 capsule (100 mg total) by mouth 3 (three) times daily.     mometasone-formoterol 100-5 MCG/ACT Aero  Commonly known as:  DULERA  Inhale 2 puffs into the lungs 2 (two) times daily.     montelukast 10 MG tablet  Commonly known as:  SINGULAIR  Take 1 tablet (10 mg total) by mouth at bedtime.     omeprazole 20 MG capsule  Commonly known as:  PRILOSEC  Take 20 mg by mouth every morning.     oxybutynin 5 MG tablet  Commonly known as:  DITROPAN  Take 5 mg by mouth at bedtime.     oxymetazoline 0.05 % nasal spray  Commonly known as:  AFRIN  Place 2 sprays into  both nostrils 2 (two) times daily as needed for congestion.     pravastatin 80 MG tablet  Commonly known as:  PRAVACHOL  Take 80 mg by mouth at bedtime.     ranitidine 150 MG tablet  Commonly known as:  ZANTAC  Take 150 mg by mouth at bedtime as needed for heartburn.     spironolactone 25 MG tablet  Commonly known as:  ALDACTONE  Take 25 mg by mouth daily.          Diet and Activity recommendation: See Discharge Instructions above   Consults obtained - neurologist Dr.  Aram Beecham over the phone   Major procedures and Radiology Reports - PLEASE review detailed and final reports for all details, in brief -       Dg Chest 2 View  05/14/2014   CLINICAL DATA:  Asthma  EXAM: CHEST  2 VIEW  COMPARISON:  12/21/2012  FINDINGS: The heart size and mediastinal contours are within normal limits. Both lungs are clear. The visualized skeletal structures are unremarkable.  IMPRESSION: No active cardiopulmonary disease.   Electronically Signed   By: Franchot Gallo M.D.   On: 05/14/2014 15:05   Ct Head Wo Contrast  05/25/2014   CLINICAL DATA:  Right lower extremity pain.  EXAM: CT HEAD WITHOUT CONTRAST  TECHNIQUE: Contiguous axial images were obtained from the base of the skull through the vertex without intravenous contrast.  COMPARISON:  MRI scan of November 21, 2013; CT scan of head of November 20, 2013.  FINDINGS: Pansinusitis is again noted. Bony calvarium is otherwise intact. Encephalomalacia of left posterior parietal cortex is noted consistent with old infarction. No mass effect or midline shift is noted. Ventricular size is within normal limits. There is no evidence of mass lesion, hemorrhage or acute infarction.  IMPRESSION: Pansinusitis is again noted. Old left posterior parietal infarction is noted. No acute intracranial abnormality seen.   Electronically Signed   By: Sabino Dick M.D.   On: 05/25/2014 09:50   Mri Brain Without Contrast  05/26/2014   CLINICAL DATA:  Rule out cerebral vascular accident.  EXAM: MRI HEAD WITHOUT CONTRAST  TECHNIQUE: Multiplanar, multiecho pulse sequences of the brain and surrounding structures were obtained without intravenous contrast.  COMPARISON:  CT of the head May 29, 2014 and MRI of the brain November 21, 2013  FINDINGS: T2 shine through within left subinsular white matter/ parietal lobe corresponding to prior infarct. No reduced diffusion to suggest acute ischemia. No susceptibility artifact to suggest hemorrhage. No midline shift or  mass effect.  Left temporal parietal insular/subinsular encephalomalacia with mild ex vacuo dilatation left lateral atrium, the ventricles and sulci are otherwise normal for patient's age. T2 bright gliosis extends along the internal capsule to the midbrain. Mild white matter changes exclusive of the encephalomalacia suggest chronic small vessel ischemic disease.  No abnormal extra-axial fluid collections. Normal major intracranial vascular flow voids observed at the skull base.  Similar panparanasal sinusitis. Ocular globes and orbital contents are nonsuspicious. No abnormal sellar expansion. No cerebellar tonsillar ectopia. No suspicious calvarial bone marrow signal.  IMPRESSION: No acute ischemia. Remote left middle cerebral artery territory infarct.  Pan paranasal sinusitis.   Electronically Signed   By: Elon Alas   On: 05/26/2014 02:47    Micro Results      No results found for this or any previous visit (from the past 240 hour(s)).   History of present illness and  Hospital Course:     Kindly see H&P for  history of present illness and admission details, please review complete Labs, Consult reports and Test reports for all details in brief Andrea Mason, is a 63 y.o. female, patient with history of  left MCA territory infarct affecting insular cortex and parietal infarct in 12/14, she had some residual R arm pain and numbness from this and dysarthria which has been improving. She also has PMH of HTN, Asthma, Allergic rhinitis, obesity, Depression. She has been on ASA for secondary CVA prophylaxis.     Came to the ER and now says her primary complaint was right-sided pain which started in her arm and progressed to her right leg with some element of feeling stiff on that side, denied that she had any weakness, says might have some tingling. Symptoms now almost completely resolved. MRI brain nonacute. Case discussed by neurologist Dr. Aram Beecham who agrees that patient can be discharged on  home dose aspirin which was 325 along with statin for secondary prevention, she has an appointment with neurologist within a week and asked to continue with that appointment unchanged, her symptoms have resolved, recently obtained echo and carotids were stable, A1c lipid panel stable. 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    Hypertension. Stable continue home medications unchanged.   History of asthma and allergies. Home regimen commenced.    Today   Subjective:   Angelique Chevalier today has no headache,no chest abdominal pain,no new weakness tingling or numbness, feels much better wants to go home today.    Objective:   Blood pressure 116/76, pulse 63, temperature 98.5 F (36.9 C), temperature source Oral, resp. rate 20, height 5\' 1"  (1.549 m), weight 84 kg (185 lb 3 oz), SpO2 100.00%.  No intake or output data in the 24 hours ending 05/26/14 0947  Exam Awake Alert, Oriented x 3, No new F.N deficits, Normal affect [place].AT,PERRAL Supple Neck,No JVD, No cervical lymphadenopathy appriciated.  Symmetrical Chest wall movement, Good air movement bilaterally, CTAB RRR,No Gallops,Rubs or new Murmurs, No Parasternal Heave +ve B.Sounds, Abd Soft, Non tender, No organomegaly appriciated, No rebound -guarding or rigidity. No Cyanosis, Clubbing or edema, No new Rash or bruise  Data Review   CBC w Diff:  Lab Results  Component Value Date   WBC 6.3 05/25/2014   HGB 12.0 05/25/2014   HCT 36.3 05/25/2014   PLT 200 05/25/2014   LYMPHOPCT 36 11/26/2013   MONOPCT 6 11/26/2013   EOSPCT 8* 11/26/2013   BASOPCT 0 11/26/2013    CMP:  Lab Results  Component Value Date   NA 142 05/25/2014   K 3.4* 05/25/2014   CL 104 05/25/2014   CO2 24 05/25/2014   BUN 9 05/25/2014   CREATININE 0.72 05/25/2014   PROT 7.5 05/25/2014   ALBUMIN 3.7 05/25/2014   BILITOT 1.1 05/25/2014   ALKPHOS 103 05/25/2014   AST 16 05/25/2014   ALT 12 05/25/2014   . Lab Results  Component Value Date   HGBA1C 5.8* 05/25/2014    Lab Results  Component Value Date   CHOL 85 05/26/2014   HDL 57 05/26/2014   LDLCALC 19 05/26/2014   TRIG 43 05/26/2014   CHOLHDL 1.5 05/26/2014      Total Time in preparing paper work, data evaluation and todays exam - 35 minutes  Thurnell Lose M.D on 05/26/2014 at 9:47 AM  [facility] Office  [phone]   **Disclaimer: This note may have been dictated with voice recognition software. Similar  sounding words can inadvertently be transcribed and this note may contain transcription errors which may not have been corrected upon publication of note.**

## 2014-05-26 NOTE — Progress Notes (Signed)
Patients discharge orders received. IV removed. Stroke education, follow up appointment, and medications reviewed with patient and daughter. VSS. Neuro stable upon discharge, patient ambulated to car. Allen, [place] Marie, RN

## 2014-05-26 NOTE — Discharge Instructions (Signed)
Follow with Primary MD Cathlean Cower, MD in 7 days   Get CBC, CMP checked  by Primary MD next visit.    Activity: As tolerated with Full fall precautions use walker/cane & assistance as needed   Disposition Home     Diet: Heart Healthy    For Heart failure patients - Check your Weight same time everyday, if you gain over 2 pounds, or you develop in leg swelling, experience more shortness of breath or chest pain, call your Primary MD immediately. Follow Cardiac Low Salt Diet and 1.8 lit/day fluid restriction.   On your next visit with her primary care physician please Get Medicines reviewed and adjusted.  Please request your Prim.MD to go over all Hospital Tests and Procedure/Radiological results at the follow up, please get all Hospital records sent to your Prim MD by signing hospital release before you go home.   If you experience worsening of your admission symptoms, develop shortness of breath, life threatening emergency, suicidal or homicidal thoughts you must seek medical attention immediately by calling 911 or calling your MD immediately  if symptoms less severe.  You Must read complete instructions/literature along with all the possible adverse reactions/side effects for all the Medicines you take and that have been prescribed to you. Take any new Medicines after you have completely understood and accpet all the possible adverse reactions/side effects.   Do not drive and provide baby sitting services if your were admitted for syncope or siezures until you have seen by Primary MD or a Neurologist and advised to do so again.  Do not drive when taking Pain medications.    Do not take more than prescribed Pain, Sleep and Anxiety Medications  Special Instructions: If you have smoked or chewed Tobacco  in the last 2 yrs please stop smoking, stop any regular Alcohol  and or any Recreational drug use.  Wear Seat belts while driving.   Please note  You were cared for by a  hospitalist during your hospital stay. If you have any questions about your discharge medications or the care you received while you were in the hospital after you are discharged, you can call the unit and asked to speak with the hospitalist on call if the hospitalist that took care of you is not available. Once you are discharged, your primary care physician will handle any further medical issues. Please note that NO REFILLS for any discharge medications will be authorized once you are discharged, as it is imperative that you return to your primary care physician (or establish a relationship with a primary care physician if you do not have one) for your aftercare needs so that they can reassess your need for medications and monitor your lab values.

## 2014-05-28 ENCOUNTER — Telehealth: Payer: Self-pay | Admitting: Neurology

## 2014-05-28 NOTE — Telephone Encounter (Signed)
Andrea Mason calling to state she was discharged from hospital after having a stroke a few days ago and she was told to schedule an appointment with Dr. Leonie Man within this week, I told Andrea Mason that we do a one month hospital follow up but Andrea Mason says she needs to be seen as soon as possible, please return call to Andrea Mason and advise.

## 2014-05-29 ENCOUNTER — Other Ambulatory Visit: Payer: Self-pay | Admitting: Physical Medicine & Rehabilitation

## 2014-05-29 ENCOUNTER — Other Ambulatory Visit: Payer: Self-pay | Admitting: Internal Medicine

## 2014-05-29 NOTE — Telephone Encounter (Signed)
Appt made with Charlott Holler, NP for 06-09-14 at 1130.

## 2014-06-01 ENCOUNTER — Telehealth: Payer: Self-pay | Admitting: Internal Medicine

## 2014-06-01 NOTE — Telephone Encounter (Signed)
Patient Information:  Caller Name: Kymberlyn  Phone: [phone]  Patient: Andrea Mason  Gender: Female  DOB: 11-25-1951  Age: 63 Years  PCP: Cathlean Cower (Adults only)  Office Follow Up:  Does the office need to follow up with this patient?: No  Instructions For The Office: N/A  RN Note:  Home care advice and call back parameters reviewed. Standing orders for Miralax- Dosage and information provided.  Patient will Be seen in the office tomorrow for evaulation.  Symptoms  Reason For Call & Symptoms: Patient admitted to hosptial on 05/25/14 for possible stroke .She did have a previous stroke 11/19/2013 with residual right sided weakness.   She was found to not have stroke but was  diagnosed with Neuropathy.  She has appt tomorrow at office with Dr. Jenny Reichmann at 15:15.   She would like to know what to take for constipation?  Last BM this morning was small amount and last Wednesday 05/27/14. The stool was not hard  but was was formed.  she would ike to be more regular.No abdominal pain or discomfort.  Recent medication -Neurotin  Reviewed Health History In EMR: Yes  Reviewed Medications In EMR: Yes  Reviewed Allergies In EMR: Yes  Reviewed Surgeries / Procedures: Yes  Date of Onset of Symptoms: 06/01/2014  Guideline(s) Used:  Constipation  Disposition Per Guideline:   Home Care  Reason For Disposition Reached:   Mild constipation  Advice Given:  General Constipation Instructions:  Eat a high fiber diet.  Drink adequate liquids.  Exercise regularly (even a daily 15 minute walk!).  Get into a rhythm - try to have a BM at the same time each day.  Don't ignore your body's signals to have a BM.  Avoid enemas and stimulant laxatives.  High Fiber Diet:  Try to eat fresh fruit and vegetables at each meal (peas, prunes, citrus, apples, beans, corn).  Eat more grain foods (bran flakes, bran muffins, graham crackers, oatmeal, brown rice, and whole wheat bread). Popcorn is a source of fiber.  Liquids:  Drink 6-8 glasses of water a day (Caution: certain medical conditions require fluid restriction).  Prune juice is a natural laxative.  Avoid alcohol.  Osmotic Laxatives:  Miralax (polyethylene glycol 3350): Miralax is an "osmotic" agent which means that it binds water and causes water to be retained within the stool. You can use this laxative to treat occasional constipation. Do not use for more than 2 weeks without approval from your doctor. Generally, Miralax produces a bowel movement in 1 to 3 days. Side effects include diarrhea (especially at higher doses). If you are pregnant, discuss with your doctor before using. Available in the [place].  Milk of Magnesia (magnesium hydroxide): This is a mild and generally safe laxative. You can use Milk of Magnesia for short-term treatment of constipation. (Research suggests that Miralax may be more effective.) Dosage is 2 tablespoons (30 ml) PO. Do not use if you have kidney disease.  Call Back If:  Constipation continues (i.e., less than 3 BMs / week or straining more than 25% of the time) after following care advice for constipation for 2 weeks  You become worse  Patient Will Follow Care Advice:  YES

## 2014-06-02 ENCOUNTER — Encounter: Payer: Self-pay | Admitting: Internal Medicine

## 2014-06-02 ENCOUNTER — Other Ambulatory Visit (INDEPENDENT_AMBULATORY_CARE_PROVIDER_SITE_OTHER): Payer: 59

## 2014-06-02 ENCOUNTER — Ambulatory Visit (INDEPENDENT_AMBULATORY_CARE_PROVIDER_SITE_OTHER): Payer: 59 | Admitting: Internal Medicine

## 2014-06-02 VITALS — BP 120/82 | HR 80 | Temp 97.6°F | Ht 62.0 in | Wt 189.5 lb

## 2014-06-02 DIAGNOSIS — J324 Chronic pansinusitis: Secondary | ICD-10-CM | POA: Insufficient documentation

## 2014-06-02 DIAGNOSIS — Z0000 Encounter for general adult medical examination without abnormal findings: Secondary | ICD-10-CM

## 2014-06-02 DIAGNOSIS — J0140 Acute pansinusitis, unspecified: Secondary | ICD-10-CM

## 2014-06-02 DIAGNOSIS — J018 Other acute sinusitis: Secondary | ICD-10-CM

## 2014-06-02 LAB — LIPID PANEL
CHOLESTEROL: 90 mg/dL (ref 0–200)
HDL: 59.4 mg/dL (ref >39.00)
LDL Cholesterol: 23 mg/dL (ref 0–99)
NonHDL: 30.6
Total CHOL/HDL Ratio: 2
Triglycerides: 38 mg/dL (ref 0.0–149.0)
VLDL: 7.6 mg/dL (ref 0.0–40.0)

## 2014-06-02 LAB — HEPATIC FUNCTION PANEL
ALBUMIN: 4.1 g/dL (ref 3.5–5.2)
ALK PHOS: 94 U/L (ref 39–117)
ALT: 15 U/L (ref 0–35)
AST: 17 U/L (ref 0–37)
Bilirubin, Direct: 0.3 mg/dL (ref 0.0–0.3)
Total Bilirubin: 1.2 mg/dL (ref 0.2–1.2)
Total Protein: 8.3 g/dL (ref 6.0–8.3)

## 2014-06-02 LAB — CBC WITH DIFFERENTIAL/PLATELET
BASOS PCT: 0.7 % (ref 0.0–3.0)
Basophils Absolute: 0 10*3/uL (ref 0.0–0.1)
Eosinophils Absolute: 1.5 10*3/uL — ABNORMAL HIGH (ref 0.0–0.7)
Eosinophils Relative: 21 % — ABNORMAL HIGH (ref 0.0–5.0)
HEMATOCRIT: 38.2 % (ref 36.0–46.0)
HEMOGLOBIN: 12.6 g/dL (ref 12.0–15.0)
LYMPHS ABS: 2.1 10*3/uL (ref 0.7–4.0)
Lymphocytes Relative: 29.5 % (ref 12.0–46.0)
MCHC: 32.9 g/dL (ref 30.0–36.0)
MCV: 89.7 fl (ref 78.0–100.0)
Monocytes Absolute: 0.5 10*3/uL (ref 0.1–1.0)
Monocytes Relative: 6.4 % (ref 3.0–12.0)
Neutro Abs: 3.1 10*3/uL (ref 1.4–7.7)
Neutrophils Relative %: 42.4 % — ABNORMAL LOW (ref 43.0–77.0)
Platelets: 226 10*3/uL (ref 150.0–400.0)
RBC: 4.26 Mil/uL (ref 3.87–5.11)
RDW: 13.3 % (ref 11.5–15.5)
WBC: 7.2 10*3/uL (ref 4.0–10.5)

## 2014-06-02 LAB — BASIC METABOLIC PANEL
BUN: 9 mg/dL (ref 6–23)
CALCIUM: 9.7 mg/dL (ref 8.4–10.5)
CO2: 29 mEq/L (ref 19–32)
Chloride: 100 mEq/L (ref 96–112)
Creatinine, Ser: 0.9 mg/dL (ref 0.4–1.2)
GFR: 81.75 mL/min (ref >60.00)
Glucose, Bld: 93 mg/dL (ref 70–99)
Potassium: 4.2 mEq/L (ref 3.5–5.1)
SODIUM: 137 meq/L (ref 135–145)

## 2014-06-02 LAB — TSH: TSH: 1.11 u[IU]/mL (ref 0.35–4.50)

## 2014-06-02 MED ORDER — AZITHROMYCIN 250 MG PO TABS: ORAL_TABLET | ORAL | Status: DC

## 2014-06-02 NOTE — Assessment & Plan Note (Signed)
Mild to mod, for antibx course,  to f/u any worsening symptoms or concerns 

## 2014-06-02 NOTE — Progress Notes (Signed)
Subjective:    Patient ID: Andrea Mason, female    DOB: [birth year], 63 y.o.   MRN: [id number]  HPI  Here for wellness and f/u;  Overall doing ok;  Pt denies CP, worsening SOB, DOE, wheezing, orthopnea, PND, worsening LE edema, palpitations, dizziness or syncope.  Pt denies neurological change such as new headache, facial or extremity weakness.  Pt denies polydipsia, polyuria, or low sugar symptoms. Pt states overall good compliance with treatment and medications, good tolerability, and has been trying to follow lower cholesterol diet.  Pt denies worsening depressive symptoms, suicidal ideation or panic. No fever, night sweats, wt loss, loss of appetite, or other constitutional symptoms.  Pt states good ability with ADL's, has low fall risk, home safety reviewed and adequate, no other significant changes in hearing or vision, though now has some cognitive changes/memory/speech change with recent stroke.  Constipatoin some improved with miralax.  Does mentions no HA with either CVA, but today incidentally with mild HA today. Also with right arm pain - ? Twisted with turning in bed. Also with "tightness" and weakness/decreased sensation to RUE/RLE affected by recent stroke.  S/p PT.  Due for cbc, cmet per recommendation of hospitalist at recent d/c. Recent CT/MRI with pan sinusitis, not tx to date per pt, with max xinus pain/pressure.  S/p 2 sinus surgy - last 2010 per Dr [name]. Overall good compliance with treatment, and good medicine tolerability.,including the gabapentin and asa. Past Medical History  Diagnosis Date  . Hypertension   . GERD (gastroesophageal reflux disease)   . Allergic rhinitis   . Asthma   . Obesity   . Depression   . History of colonic polyps   . DJD (degenerative joint disease), cervical   . 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 Stroke    Past Surgical History  Procedure Laterality Date  . Nasal sinus surgery  07/2006    Dr. [name]  . Nasal polyp surgery  07/2006      x 2 with Dr. [name]  . Foot surgery  1998  . Tubal ligation      reports that she has never smoked. She has never used smokeless tobacco. She reports that she does not drink alcohol or use illicit drugs. family history includes Colon cancer in her brother. Allergies  Allergen Reactions  . Ivp Dye [Iodinated Diagnostic Agents] Nausea And Vomiting    Reaction: hot flashes  . Promethazine-Codeine Nausea And Vomiting  . Tramadol    Current Outpatient Prescriptions on File Prior to Visit  Medication Sig Dispense Refill  . albuterol (PROAIR HFA) 108 (90 BASE) MCG/ACT inhaler Inhale 2 puffs into the lungs every 6 (six) hours as needed for wheezing. 2 puffs every 4 hours as needed only  if your can't catch your breath  1 Inhaler  2  . amLODipine (NORVASC) 5 MG tablet Take 1 tablet (5 mg total) by mouth daily.  90 tablet  3  . aspirin 325 MG tablet Take 1 tablet (325 mg total) by mouth daily.  30 tablet  0  . Biotin 5000 MCG CAPS Take 1 capsule by mouth daily.      . cetirizine (ZYRTEC) 10 MG tablet Take 10 mg by mouth daily.       . clotrimazole (MYCELEX) 10 MG troche Take 10 mg by mouth 4 (four) times daily as needed (thrush).      Marland Kitchen dextromethorphan-guaiFENesin (MUCINEX DM) 30-600 MG per 12 hr tablet Take 2 tablets by mouth daily.       Marland Kitchen  fluticasone (FLONASE) 50 MCG/ACT nasal spray Place 2 sprays into both nostrils 2 (two) times daily.  16 g  11  . gabapentin (NEURONTIN) 100 MG capsule Take 1 capsule (100 mg total) by mouth 3 (three) times daily.  90 capsule  0  . mometasone-formoterol (DULERA) 100-5 MCG/ACT AERO Inhale 2 puffs into the lungs 2 (two) times daily.  1 Inhaler  3  . montelukast (SINGULAIR) 10 MG tablet Take 1 tablet (10 mg total) by mouth at bedtime.  30 tablet  11  . omeprazole (PRILOSEC) 20 MG capsule Take 20 mg by mouth every morning.      Marland Kitchen oxybutynin (DITROPAN) 5 MG tablet Take 5 mg by mouth at bedtime.      Marland Kitchen oxybutynin (DITROPAN) 5 MG tablet TAKE 1 TABLET BY MOUTH  EVERY DAY AT BEDTIME  30 tablet  6  . oxymetazoline (AFRIN) 0.05 % nasal spray Place 2 sprays into both nostrils 2 (two) times daily as needed for congestion.      . pravastatin (PRAVACHOL) 80 MG tablet Take 80 mg by mouth at bedtime.      . ranitidine (ZANTAC) 150 MG tablet Take 150 mg by mouth at bedtime as needed for heartburn.      Marland Kitchen Respiratory Therapy Supplies (FLUTTER) DEVI Use as directed.  1 each  0  . spironolactone (ALDACTONE) 25 MG tablet Take 25 mg by mouth daily.       No current facility-administered medications on file prior to visit.   Review of Systems Constitutional: Negative for increased diaphoresis, other activity, appetite or other siginficant weight change  HENT: Negative for worsening hearing loss, ear pain, facial swelling, mouth sores and neck stiffness.   Eyes: Negative for other worsening pain, redness or visual disturbance.  Respiratory: Negative for shortness of breath and wheezing.   Cardiovascular: Negative for chest pain and palpitations.  Gastrointestinal: Negative for diarrhea, blood in stool, abdominal distention or other pain Genitourinary: Negative for hematuria, flank pain or change in urine volume.  Musculoskeletal: Negative for myalgias or other joint complaints.  Skin: Negative for color change and wound.  Neurological: Negative for syncope and numbness. other than noted Hematological: Negative for adenopathy. or other swelling Psychiatric/Behavioral: Negative for hallucinations, self-injury, decreased concentration or other worsening agitation.      Objective:   Physical Exam BP 120/82  Pulse 80  Temp(Src) 97.6 F (36.4 C) (Oral)  Ht 5\' 2"  (1.575 m)  Wt 189 lb 8 oz (85.957 kg)  BMI 34.65 kg/m2  SpO2 97% VS noted,  Constitutional: Pt is oriented to person, place, and time. Appears well-developed and well-nourished.  Head: Normocephalic and atraumatic.  Right Ear: External ear normal.  Left Ear: External ear normal.  Nose: Nose normal.    Mouth/Throat: Oropharynx is clear and moist.  Eyes: Conjunctivae and EOM are normal. Pupils are equal, round, and reactive to light.  Neck: Normal range of motion. Neck supple. No JVD present. No tracheal deviation present.  Cardiovascular: Normal rate, regular rhythm, normal heart sounds and intact distal pulses.   Pulmonary/Chest: Effort normal and breath sounds without rales or wheezing  Abdominal: Soft. Bowel sounds are normal. NT. No HSM  Musculoskeletal: Normal range of motion. Exhibits no edema.  Lymphadenopathy:  Has no cervical adenopathy.  Neurological: Pt is alert and oriented to person, place, and time. Pt has normal reflexes. No cranial nerve deficit o/w not done in detail Skin: Skin is warm and dry. No rash noted.  Psychiatric:  Has normal mood and  affect. Behavior is normal.     Assessment & Plan:

## 2014-06-02 NOTE — Assessment & Plan Note (Signed)

## 2014-06-02 NOTE — Patient Instructions (Signed)
Please take all new medication as prescribed - the antibiotic (with a refill if you need it)  Please continue the Miralax daily; you can also use Dulcolox as needed if not working well  Please continue all other medications as before, and refills have been done if requested.  Please have the pharmacy call with any other refills you may need.  Please continue your efforts at being more active, low cholesterol diet, and weight control.  You are otherwise up to date with prevention measures today.  Please keep your appointments with your specialists as you may have planned  Please go to the LAB in the Basement (turn left off the elevator) for the tests to be done today  You will be contacted by phone if any changes need to be made immediately.  Otherwise, you will receive a letter about your results with an explanation, but please check with MyChart first.  Please remember to sign up for MyChart if you have not done so, as this will be important to you in the future with finding out test results, communicating by private email, and scheduling acute appointments online when needed.  Please return in 6 months, or sooner if needed

## 2014-06-02 NOTE — Progress Notes (Signed)
   Subjective:    Patient ID: Andrea Mason, female    DOB: November 27, 1951, 63 y.o.   MRN: [id number]  HPI    Review of Systems     Objective:   Physical Exam        Assessment & Plan:

## 2014-06-02 NOTE — Progress Notes (Signed)
Pre visit review using our clinic review tool, if applicable. No additional management support is needed unless otherwise documented below in the visit note. 

## 2014-06-09 ENCOUNTER — Encounter: Payer: Self-pay | Admitting: Nurse Practitioner

## 2014-06-09 ENCOUNTER — Ambulatory Visit (INDEPENDENT_AMBULATORY_CARE_PROVIDER_SITE_OTHER): Payer: 59 | Admitting: Nurse Practitioner

## 2014-06-09 VITALS — BP 124/80 | HR 84 | Ht 61.5 in | Wt 184.0 lb

## 2014-06-09 DIAGNOSIS — R55 Syncope and collapse: Secondary | ICD-10-CM

## 2014-06-09 DIAGNOSIS — I69998 Other sequelae following unspecified cerebrovascular disease: Secondary | ICD-10-CM

## 2014-06-09 DIAGNOSIS — R209 Unspecified disturbances of skin sensation: Principal | ICD-10-CM

## 2014-06-09 MED ORDER — BACLOFEN 10 MG PO TABS: 5.0000 mg | ORAL_TABLET | Freq: Two times a day (BID) | ORAL | Status: DC

## 2014-06-09 NOTE — Patient Instructions (Addendum)
Continue aspirin for second stroke prevention and strict control of hypertension with blood pressure goal below 140/90.  Diet, exercise regularly and maintain ideal body weight.  Patient needs ambulatory cardiac monitoring for paroxysmal atrial fibrillation. Someone will call to set up this appt, likely will go to Dr. Jackalyn Lombard [facility], [employer].   Start Baclofen (Lioresal) 5mg , Take 1/2 tablet at bedtime for muscle tightness.  May take 2 times a day.  If 1/2 tablet not effective, may take whole tablet.  Side effects may be drowsiness or dizziness.  Return for followup in 6 months or earlier if necessary.

## 2014-06-09 NOTE — Progress Notes (Signed)
PATIENT: Andrea Mason DOB: 11-21-1951  REASON FOR VISIT: routine follow up for stroke HISTORY FROM: patient  HISTORY OF PRESENT ILLNESS: 33 year African American lady who was admitted with sudden onset of confusion and right hand weakness on 11/22/13. She was initially thought to have a syncopal event but subsequent workup including an MRI scan showed a left insular cortex and parietal infarct. Patient was admitted to [facility] long hospital and seen by the neuro hospital this. MRA of the brain which I personally reviewed shows decrease MCA branches in the area of the stroke but no large vessel proximal occlusion. Transthoracic echo showed normal ejection fraction without cardiac source of embolism. Telemetry monitoring did not reveal any obvious cardiac arrhythmias. Patient was started on aspirin for stroke prevention. Carotid Dopplers were unremarkable. Lipid profile showed total cholesterol of 80, triglycerides 53, HDL 46 and LDL 23 mg percent. Hemoglobin A1c was borderline at 6.1. Patient was transferred to inpatient rehabilitation where she stayed for a few weeks and was discharged home. She's done well since discharge. She still getting outpatient speech therapy and will finish next week. She has regained most of her speech back though she has learned to speak slowly and did greatly. She still has some word finding difficulties and word hesitancy. She has residual numbness in the right hand and has trouble cooking food but is able to do most activities for herself and is quite independent. She states her blood pressure is well controlled and it is 128/82 today. She has no prior history of strokes or TIAs.  She has no known history of atrial fibrillation, smoking, diabetes or significant cardiac disease. She has remote childhood history of syncopal events but nothing in recent years. Interestingly she states that following her recent stroke her accent has changed to what sounds like a [place] accent  and family members find this quite amusing.   UPDATE 06/09/14 (LL): Since last visit, she was admitted to [facility] on 05/25/14 for right arm pain and paresthesia in right leg, some worsening from baseline per history from prior CVA.  MRI showed no new infarct. She was started on gabapentin by by Dr. Jenny Reichmann but has not had much response. She forgot to get in touch with the research dept about the RESPECT-ESUS study until too much time had passed to enroll. Her blood pressure has been well controlled, it is 124/80 in the office today. She is tolerating daily aspirin well with no signs of significant bleeding or bruising.  Last hgb a1c was lower at 5.8. She has residual language difficulties, mainly dysfluency and expressive aphasia.  Right sided spasticity has developed in her right arm and leg, mostly bothers her at night.   ROS:  14 system review of systems is positive for speech difficulty, frequent waking, snoring, hearing loss, difficulty swallowing, skin sensitivity, allergies, snoring, wheezing, cough, shortness of breath, wheezing, eye redness, eye itching, light sensitivity, memory loss, headache, numbness, weakness, tremors, anxiety, hyperactive, walking difficulty, itching and occasional trouble swallowing   ALLERGIES: Allergies  Allergen Reactions  . Ivp Dye [Iodinated Diagnostic Agents] Nausea And Vomiting    Reaction: hot flashes  . Promethazine-Codeine Nausea And Vomiting  . Tramadol     HOME MEDICATIONS: Outpatient Prescriptions Prior to Visit  Medication Sig Dispense Refill  . albuterol (PROAIR HFA) 108 (90 BASE) MCG/ACT inhaler Inhale 2 puffs into the lungs every 6 (six) hours as needed for wheezing. 2 puffs every 4 hours as needed only  if your can't catch your  breath  1 Inhaler  2  . amLODipine (NORVASC) 5 MG tablet Take 1 tablet (5 mg total) by mouth daily.  90 tablet  3  . aspirin 325 MG tablet Take 1 tablet (325 mg total) by mouth daily.  30 tablet  0  . azithromycin (ZITHROMAX  Z-PAK) 250 MG tablet Use as directed  6 tablet  1  . Biotin 5000 MCG CAPS Take 1 capsule by mouth daily.      . cetirizine (ZYRTEC) 10 MG tablet Take 10 mg by mouth daily.       . clotrimazole (MYCELEX) 10 MG troche Take 10 mg by mouth 4 (four) times daily as needed (thrush).      . fluticasone (FLONASE) 50 MCG/ACT nasal spray Place 2 sprays into both nostrils 2 (two) times daily.  16 g  11  . gabapentin (NEURONTIN) 100 MG capsule Take 1 capsule (100 mg total) by mouth 3 (three) times daily.  90 capsule  0  . mometasone-formoterol (DULERA) 100-5 MCG/ACT AERO Inhale 2 puffs into the lungs 2 (two) times daily.  1 Inhaler  3  . montelukast (SINGULAIR) 10 MG tablet Take 1 tablet (10 mg total) by mouth at bedtime.  30 tablet  11  . omeprazole (PRILOSEC) 20 MG capsule Take 20 mg by mouth every morning.      Marland Kitchen oxybutynin (DITROPAN) 5 MG tablet TAKE 1 TABLET BY MOUTH EVERY DAY AT BEDTIME  30 tablet  6  . oxymetazoline (AFRIN) 0.05 % nasal spray Place 2 sprays into both nostrils 2 (two) times daily as needed for congestion.      . pravastatin (PRAVACHOL) 80 MG tablet Take 80 mg by mouth at bedtime.      . ranitidine (ZANTAC) 150 MG tablet Take 150 mg by mouth at bedtime as needed for heartburn.      Marland Kitchen Respiratory Therapy Supplies (FLUTTER) DEVI Use as directed.  1 each  0  . spironolactone (ALDACTONE) 25 MG tablet Take 25 mg by mouth daily.      Marland Kitchen dextromethorphan-guaiFENesin (MUCINEX DM) 30-600 MG per 12 hr tablet Take 2 tablets by mouth daily.       Marland Kitchen oxybutynin (DITROPAN) 5 MG tablet Take 5 mg by mouth at bedtime.       No facility-administered medications prior to visit.    PHYSICAL EXAM Filed Vitals:   06/09/14 1130  BP: 124/80  Pulse: 84  Height: 5' 1.5" (1.562 m)  Weight: 184 lb (83.462 kg)   Body mass index is 34.21 kg/(m^2). No exam data present No flowsheet data found.  No flowsheet data found.  Physical Exam  General: middle aged obese african american lady, seated, in no  evident distress  Head: head normocephalic and atraumatic. Orohparynx benign  Neck: supple with no carotid or supraclavicular bruits  Cardiovascular: regular rate and rhythm, no murmurs  Musculoskeletal: no deformity  Skin: no rash/petichiae  Vascular: Normal pulses all extremities   Neurologic Exam  Mental Status: Awake and fully alert. Oriented to place and time. Recent and remote memory intact. Attention span, concentration and fund of knowledge appropriate. Mood and affect appropriate. Mild expressive aphasia with word finding difficulties and some disfluency. Able to speak sentences. Diminished naming animals 4 only. Good comprehension, repetition.  Cranial Nerves: Fundoscopic exam not done.  Pupils equal, briskly reactive to light. Extraocular movements full without nystagmus. Visual fields full to confrontation. Hearing intact. Facial sensation intact. Face, tongue, palate moves normally and symmetrically.  Motor: Normal bulk and tone.  Normal strength in all tested extremity muscles. Fine finger movements slightly diminished in the right hand. Left hand orbits right extremity. Sensory: intact to touch and pinprick and vibratory sensation. Slight diminished touch pinprick sensation in the right hand alone.  Coordination: Rapid alternating movements slower on the right side. Finger-to-nose and heel-to-shin performed accurately bilaterally.  Gait and Station: Arises from chair without difficulty. Stance is normal. Gait demonstrates normal stride length and balance. Able to heel, toe walk without difficulty. Mild unsteadiness with tandem. Reflexes: 1+ and symmetric. Toes downgoing.  NIHSS 2  Modified Rankin 1   ASSESSMENT: 63 year African American lady with a left MCA branch infarct in December 0131 of embolic in etiology without definite identified source. Vascular risk factors of hypertension and mild obesity alone. Has had mild right-sided spasticity develop over the last few months, most  bothersome at night.  PLAN:  I had a long discussion with the patient regarding her stroke, discussedthe results of admission hospital and answered questions. Continue aspirin for second stroke prevention and strict control of hypertension with blood pressure goal below 140/90.  Diet, exercise regularly and maintain ideal body weight.  Patient needs ambulatory 30-day cardiac monitoring for paroxysmal atrial fibrillation, set up through Dr. Jackalyn Lombard office. Start Baclofen (Lioresal) 5mg , Take 1/2 tablet at bedtime for muscle tightness and spasticity.  May take 2 times a day.  If 1/2 tablet not effective, may take whole tablet.  Side effects may be drowsiness or dizziness. She may take this in addition to the Gabapentin. Return for followup in 6 months or earlier if necessary.  Orders Placed This Encounter  Procedures  . Cardiac event monitor   Meds ordered this encounter  Medications  . baclofen (LIORESAL) 10 MG tablet    Sig: Take 0.5 tablets (5 mg total) by mouth 2 (two) times daily.    Dispense:  30 each    Refill:  5    Order Specific Question:  Supervising Provider    Answer:  Penni Bombard [[id number]]   Return in about 6 months (around 12/10/2014) for stroke follow up.  Philmore Pali, MSN, NP-C 06/09/2014, 12:30 PM [facility] [address] [place], [place] [address] [phone]  Note: This document was prepared with digital dictation and possible smart phrase technology. Any transcriptional errors that result from this process are unintentional.

## 2014-06-10 ENCOUNTER — Telehealth: Payer: Self-pay | Admitting: *Deleted

## 2014-06-10 NOTE — Telephone Encounter (Signed)
Pt stating she has been having trouble making a bowel movement. Spoke with nurse and advise for her to use some miralx. She stated she did have a movement but it wasn't a good one. Wanting md recommendation what to use to keep her going...Johny Chess

## 2014-06-10 NOTE — Telephone Encounter (Signed)
Notified pt with md response.../lmb 

## 2014-06-10 NOTE — Telephone Encounter (Signed)
miralax daily is the usual first tx now, and also an occas OTC dulcolox can be done as well, or even glycerin suppos OTC prn

## 2014-06-11 ENCOUNTER — Other Ambulatory Visit: Payer: Self-pay | Admitting: Neurology

## 2014-06-11 ENCOUNTER — Telehealth: Payer: Self-pay | Admitting: Nurse Practitioner

## 2014-06-11 DIAGNOSIS — R55 Syncope and collapse: Secondary | ICD-10-CM

## 2014-06-11 NOTE — Telephone Encounter (Signed)
Patient questioning if she could take both medications?  baclofen (LIORESAL) 10 MG tablet and gabapentin (NEURONTIN) 100 MG capsule.  Please call and advise.

## 2014-06-11 NOTE — Telephone Encounter (Signed)
Last OV note says: Start Baclofen (Lioresal) 5mg , Take 1/2 tablet at bedtime for muscle tightness and spasticity. May take 2 times a day. If 1/2 tablet not effective, may take whole tablet. Side effects may be drowsiness or dizziness. She may take this in addition to the Gabapentin. I called the patient back.  Got no answer.  Left message.

## 2014-06-16 ENCOUNTER — Encounter: Payer: Self-pay | Admitting: Radiology

## 2014-06-16 ENCOUNTER — Encounter (INDEPENDENT_AMBULATORY_CARE_PROVIDER_SITE_OTHER): Payer: 59

## 2014-06-16 DIAGNOSIS — R209 Unspecified disturbances of skin sensation: Principal | ICD-10-CM

## 2014-06-16 DIAGNOSIS — R55 Syncope and collapse: Secondary | ICD-10-CM

## 2014-06-16 DIAGNOSIS — I69998 Other sequelae following unspecified cerebrovascular disease: Secondary | ICD-10-CM

## 2014-06-16 NOTE — Progress Notes (Signed)
Patient ID: Andrea Mason, female   DOB: 1950/12/13, 63 y.o.   MRN: [id number] [employer] 30 day monitor applied. EOS 07-16-14

## 2014-06-24 ENCOUNTER — Ambulatory Visit (INDEPENDENT_AMBULATORY_CARE_PROVIDER_SITE_OTHER): Payer: 59 | Admitting: Internal Medicine

## 2014-06-24 ENCOUNTER — Telehealth: Payer: Self-pay | Admitting: Internal Medicine

## 2014-06-24 ENCOUNTER — Encounter: Payer: Self-pay | Admitting: Internal Medicine

## 2014-06-24 VITALS — BP 140/76 | HR 94 | Temp 98.0°F | Ht 60.0 in | Wt 183.2 lb

## 2014-06-24 VITALS — BP 112/96 | HR 105 | Ht 61.5 in | Wt 181.8 lb

## 2014-06-24 DIAGNOSIS — J324 Chronic pansinusitis: Secondary | ICD-10-CM

## 2014-06-24 DIAGNOSIS — I6340 Cerebral infarction due to embolism of unspecified cerebral artery: Secondary | ICD-10-CM

## 2014-06-24 DIAGNOSIS — J45909 Unspecified asthma, uncomplicated: Secondary | ICD-10-CM

## 2014-06-24 DIAGNOSIS — J0140 Acute pansinusitis, unspecified: Secondary | ICD-10-CM

## 2014-06-24 DIAGNOSIS — I10 Essential (primary) hypertension: Secondary | ICD-10-CM

## 2014-06-24 DIAGNOSIS — J328 Other chronic sinusitis: Secondary | ICD-10-CM

## 2014-06-24 DIAGNOSIS — I639 Cerebral infarction, unspecified: Secondary | ICD-10-CM

## 2014-06-24 MED ORDER — AMOXICILLIN-POT CLAVULANATE 875-125 MG PO TABS: 1.0000 | ORAL_TABLET | Freq: Two times a day (BID) | ORAL | Status: DC

## 2014-06-24 NOTE — Telephone Encounter (Signed)
Pt came by office to request a referral to Dr [name] with [facility]. Pt states she needs Dr Jenny Reichmann to process the referral so that she can get an appointment soon. Please contact pt when request is reviewed.

## 2014-06-24 NOTE — Telephone Encounter (Signed)
Patient informed. 

## 2014-06-24 NOTE — Assessment & Plan Note (Signed)
-  med calendar 03/17/2013 > not using 10/24/2013 , 04/07/2014  - hfa 75% p coaching 03/25/14   Despite apparent significant purulent pnds/rhinitis/ sinusitis her asthma is very well controlled and no need at all for saba while on dulera 100 2bid     Each maintenance medication was reviewed in detail including most importantly the difference between maintenance and as needed and under what circumstances the prns are to be used. This was done in the context of a medication calendar review which provided the patient with a user-friendly unambiguous mechanism for medication administration and reconciliation and provides an action plan for all active problems. It is critical that this be shown to every doctor  for modification during the office visit if necessary so the patient can use it as a working document.

## 2014-06-24 NOTE — Progress Notes (Signed)
Subjective:    Patient ID: Andrea Mason, female   DOB: Apr 07, 1951   MRN: [id number]     Brief patient profile:  47 yobf never smoker followed by Dr Andrea Mason for allergic rhinitis and asthma   History of Present Illness  02/05/2013 1st pulmonary eval in EPIC era baseline = completely better on qvar 80 2bid and only use rescue once a week at baseline then much worse starting 3/3 with cough/ congestion/ green mucus and rescue x one by time of ov at 2pm.  No resting sob, very hoarse with harsh barking cough >Augmentin rx       04/18/2013  ov/Wert acute w/in no calendar Chief Complaint  Patient presents with  . Acute Visit    Pt c/o wheezing for the past several wks, worse today after being exposed to fragrences. She also c/o cough since last night, non prod.    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 rec Prednisone 10 mg take  4 each am x 2 days,   2 each am x 2 days,  1 each am x2days and stop Only use your albuterol as a rescue    06/03/2013 f/u ov/Wert f/u asthma/ chronic rhinitis Chief Complaint  Patient presents with  . Follow-up    6 week follow up Complains of green mucous   abrupt cough onset x one week, not needing proaire, assoc with nasal congestion but minimal sob. Rec: augmentin/ pred  06/30/13- 74 yobf never smoker followed byDr Andrea Mason for asthma/ Last saw Dr Andrea Mason 2012-for allergic rhinitis and asthma  FOLLOW UP:  pt reports she has had a few episodes with sinus infections/URI requing rounds of abx-- would like to discuss restarting allergy vaccine per Dr. [name] patient needs allegies more under control She was on allergy vaccine for one or 2 years, stopping 2 years ago-drifted off. Complains of frequent nasal congestion, green nasal discharge treated with prednisone and amoxicillin. Right eye waters. Some sneeze- perennial but worse in spring and fall pollen seasons. Using Zyrtec and Flonase. Denies headaches, ear pressure or  itching.  History of sinus surgery twice by Dr. [name]. CT head 2012-chronic sinus disease    08/21/13-62 yobf never smoker followed by Dr Andrea Mason for asthma/  Dr Andrea Mason [phone] allergic rhinitis and asthma  FOLLOWS FOR: Not on Allergy vaccine ; has not got Zpak that was called into pharmacy recently We reviewed skin testing from 2011. She had tried to start allergy vaccine but could not afford it and says that she cannot afford it now. Perennial rhinitis and asthma. Can't afford to fill recent prescription for Zithromax. When she takes Augmentin she says it loosens ropy mucus from her nose. Continues daily Zyrtec, Alvesco and uses rescue inhaler 2 or 3 days per week.  Has seen Dr Andrea Mason ENT/ [place] and Dr Andrea Mason ENT..  09/23/13-  58 yobf never smoker followed by Dr Andrea Mason for asthma/  Dr Andrea Mason for allergic rhinitis FOLLOWS FOR:has stopped vaccine; still working with insurance about  allergy coverage. Thinks she has a sinus infection. Sneezing and coughing with green from nose. Ears feel full and itch. She did not followup with Dr. [name] rec augmentin > did not take alvesco > ran out of samples and did not fill rx   10/24/2013 f/u ov/Wert re: cough > rhinitis > sob Chief Complaint  Patient presents with  . Acute Visit    Pt c/o sore throat, non prod cough  and PND x 1 wk.   also very hoarse. Green discharge from nose in am's esp. No perceived increased need for saba. 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  2. Subcutaneous Lovenox for deep venous thrombosis prophylaxis.  3. Dysphagia.  4. Hypertension.  5. Acute sinusitis, resolved.  6. Hyperlipidemia.   7. Gastroesophageal reflux disease.  8. Asthma.   02/26/14 MW/ post hosp ov from [facility] CVA doing well but confused again with meds, did not bring med calendar "they've changed my meds" no need for saba on symbicort 80 2bid  rec Just use your symbicort if you have cough shortness or breath or wheeze  See [name] in 4  Weeks for med calendar    03/24/2014 acute  ov/Wert re: asthma flare / no med calendar yet, confused with names of meds/ not sure she's on symbicort and using lots of saba including 3 h prior to [name] Complaint  Patient presents with  . Acute Visit    Pt c/o wheezing for the past several wks- worse at night and unable to sleep.  She also c/o occ cough- prod occ with minimal green sputum.    >>augmentin and steroid taper   04/07/2014 Follow up and Med review  Patient returns for a followup and medication review. We reviewed all her medications and organized them into a patient medication calendar with patient education. Appears, that she's taking her medications correctly. Patient was seen last with an asthma flare. Last visit, with a URI/sinusitis . She was treated with Augmentin and steroid taper with improvement in symptoms.   05/14/14 Follow up  Pt c/o increased cough with green mucus, bad taste in mouth, ear itching x 1 week. Pt using Mucinex 1200mg  qd. No fever. pt is very confused with medications--not sure if she is taking the right inhalers--not sure if she has Dulera/Symbicort We reviewed her meds and she is suppose to be on [place]. She denies any hemoptysis, orthopnea, PND, or leg swelling rec No change in recs  06/24/2014 f/u ov/Wert re: chronic cough worse at hs and early in am/ productive  Chief Complaint  Patient presents with  . Acute Visit    Pt c/o sinus congestion and green nasal d/c not improving since her last visit.    breathing ok/ no need for saba/ on dulera 100 2bid Has appt to see Andrea Mason  Already tried zpak from primary care, min  improvement then worse again    No obvious day to day or daytime variabilty or assoc  cp or chest tightness, subjective wheeze overt  hb symptoms. No unusual exp hx or h/o childhood pna/ asthma or knowledge of premature birth.  Sleeping ok without nocturnal  or early am exacerbation  of respiratory  c/o's or need for noct saba. Also denies any obvious fluctuation of symptoms with weather or environmental changes or other aggravating or alleviating factors except as outlined above   Current Medications, Allergies, Complete Past Medical History, Past Surgical History, Family History, and Social History were reviewed in [employer] record.  ROS  The  following are not active complaints unless bolded sore throat, dysphagia, dental problems, itching, sneezing,  nasal congestion or excess/ purulent secretions, ear ache,   fever, chills, sweats, unintended wt loss, pleuritic or exertional cp, hemoptysis,  orthopnea pnd or leg swelling, presyncope, palpitations, heartburn, abdominal pain, anorexia, nausea, vomiting, diarrhea  or change in bowel or urinary habits, change in stools or urine, dysuria,hematuria,  rash, arthralgias, visual complaints, headache, numbness weakness or ataxia or problems with walking or coordination,  change in mood/affect or memory.           OBJ- Physical Exam   amb wf with expressive aphasia and R arm weakness   Wt Readings from Last 3 Encounters:  06/24/14 183 lb 3.2 oz (83.099 kg)  06/24/14 181 lb 12.8 oz (82.464 kg)  06/09/14 184 lb (83.462 kg)      General- Alert, Oriented, Affect-appropriate,    \HEENT: nl dentition, turbinates, and orophanx. Nl external ear canals without cough reflex, no thrush noted    NECK :  without JVD/Nodes/TM/ nl carotid upstrokes bilaterally   LUNGS: no acc muscle use, CTA w/ no wheezing   CV:  RRR  no s3 or murmur or increase in P2, no edema   ABD:  soft and nontender with nl excursion in the supine  position. No bruits or organomegaly, bowel sounds nl  MS:  warm without deformities, calf tenderness, cyanosis or clubbing  SKIN: warm and dry without lesions         MRI 05/25/14 Pansinusitis                    Assessment:

## 2014-06-24 NOTE — Telephone Encounter (Signed)
Done erx 

## 2014-06-24 NOTE — Patient Instructions (Signed)
Your physician recommends that you schedule a follow-up appointment in: 6 weeks with Dr Rayann Heman after you are done wearing monitor

## 2014-06-24 NOTE — Assessment & Plan Note (Signed)
See MRI 05/25/14 > no resp to zpak > rec Augmentin 875 mg take one pill twice daily  X 10 days - > f/u [place]   No pulmonary f/u for now

## 2014-06-24 NOTE — Patient Instructions (Addendum)
Augmentin 875 mg take one pill twice daily  X 10 days - take at breakfast and supper with large glass of water.  It would help reduce the usual side effects (diarrhea and yeast infections) if you ate cultured yogurt at lunch.   Keep appt to see Dr [name]   Pulmonary follow up is as needed

## 2014-06-28 NOTE — Progress Notes (Signed)
ELECTROPHYSIOLOGY CONSULT NOTE  Patient ID: Andrea Mason MRN: [id number], DOB/AGE: November 20, 1951   Admit date: (Not on file) Date of Consult: 06/28/2014  Primary Physician: Cathlean Cower, MD Consulting:  Charlott Holler  Reason for Consultation: Cryptogenic stroke; recommendations regarding Implantable Loop Recorder  History of Present Illness Andrea Mason was admitted on 12/14 with acute CVA. she has been monitored on telemetry which has demonstrated no arrhythmias.  She continues to wear a 30 day event monitor presently. No cause has been identified. Inpatient stroke work-up did not reveal a cause.  She is making very slow but stable recovery. EP has been asked to evaluate for placement of an implantable loop recorder to monitor for atrial fibrillation.  Past Medical History Past Medical History  Diagnosis Date  . Hypertension   . GERD (gastroesophageal reflux disease)   . Allergic rhinitis   . Asthma   . Obesity   . Depression   . History of colonic polyps   . DJD (degenerative joint disease), cervical   . 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 Stroke     Past Surgical History Past Surgical History  Procedure Laterality Date  . Nasal sinus surgery  07/2006    Dr. [name]  . Nasal polyp surgery  07/2006    x 2 with Dr. [name]  . Foot surgery  1998  . Tubal ligation      Allergies/Intolerances Allergies  Allergen Reactions  . Ivp Dye [Iodinated Diagnostic Agents] Nausea And Vomiting    Reaction: hot flashes  . Promethazine-Codeine Nausea And Vomiting  . Tramadol    Inpatient Medications   Social History History   Social History  . Marital Status: Widowed    Spouse Name: N/A    Number of Children: 3  . Years of Education: N/A   Occupational History  . cosmetic consultant     [employer]   Social History Main Topics  . Smoking status: Never Smoker   . Smokeless tobacco: Never Used  . Alcohol Use: No  . Drug Use: No  . Sexual Activity: Not Currently   Other  Topics Concern  . 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   Patient lives alone.    Review of Systems All other systems reviewed and are otherwise negative except as noted above.  Physical Exam Blood pressure 112/96, pulse 105, height 5' 1.5" (1.562 m), weight 181 lb 12.8 oz (82.464 kg).  General: Well developed, well appearing 63 y.o. female in no acute distress. HEENT: Normocephalic, atraumatic. EOMs intact. Sclera nonicteric. Oropharynx clear.  Neck: Supple without bruits. No JVD. Lungs: Respirations regular and unlabored, CTA bilaterally. No wheezes, rales or rhonchi. Heart: RRR. S1, S2 present. No murmurs, rub, S3 or S4. Abdomen: Soft, non-tender, non-distended. BS present x 4 quadrants. No hepatosplenomegaly.  Extremities: No clubbing, cyanosis or edema. DP/PT/Radials 2+ and equal bilaterally. Psych: Normal affect. Neuro: slurred speech is still present post stroke, she has difficulty with expressive communication but is extremely pleasant Musculoskeletal: No kyphosis. Skin: Intact. Warm and dry. No rashes or petechiae in exposed areas.   Labs Lab Results  Component Value Date   WBC 7.2 06/02/2014   HGB 12.6 06/02/2014   HCT 38.2 06/02/2014   MCV 89.7 06/02/2014   PLT 226.0 06/02/2014   No results found for this basename: NA, K, CL, CO2, BUN,  CREATININE, CALCIUM, LABALBU, PROT, BILITOT, ALKPHOS, ALT, AST, GLUCOSE,  in the last 168 hours No results found for this basename: INR,  in the last 72 hours  Radiology/Studies No results found.  Echocardiogram  reviewed  12-lead ECG sinus 30 day monitor- sinus thus far   Assessment and Plan 1. Cryptogenic stroke  If 30 day monitor is negative, I would recommend loop recorder insertion to monitor for AF long term. The indication for loop recorder insertion / monitoring for AF in setting of  cryptogenic stroke was discussed with the patient. The loop recorder insertion procedure was reviewed in detail including risks and benefits. These risks include but are not limited to bleeding and infection. The patient expressed verbal understanding and agrees to proceed.  She will return in 6 weeks for follow-up after her 30 day monitor results are available.  I would anticipate implant of the device likely at that time.  2. HTN Stable No change required today   [name] MD

## 2014-07-13 ENCOUNTER — Ambulatory Visit: Payer: Self-pay | Admitting: Nurse Practitioner

## 2014-07-14 ENCOUNTER — Ambulatory Visit: Payer: 59 | Admitting: Internal Medicine

## 2014-07-15 ENCOUNTER — Ambulatory Visit: Payer: 59 | Admitting: Neurology

## 2014-07-18 ENCOUNTER — Emergency Department (HOSPITAL_COMMUNITY)
Admission: EM | Admit: 2014-07-18 | Discharge: 2014-07-18 | Disposition: A | Discharge status: Home / Self Care | Payer: 59 | Attending: Emergency Medicine | Admitting: Emergency Medicine

## 2014-07-18 ENCOUNTER — Emergency Department (HOSPITAL_COMMUNITY): Payer: 59

## 2014-07-18 ENCOUNTER — Encounter (HOSPITAL_COMMUNITY): Payer: Self-pay | Admitting: Emergency Medicine

## 2014-07-18 DIAGNOSIS — Z792 Long term (current) use of antibiotics: Secondary | ICD-10-CM | POA: Insufficient documentation

## 2014-07-18 DIAGNOSIS — Z860100 Personal history of colon polyps, unspecified: Secondary | ICD-10-CM | POA: Insufficient documentation

## 2014-07-18 DIAGNOSIS — Z8739 Personal history of other diseases of the musculoskeletal system and connective tissue: Secondary | ICD-10-CM | POA: Insufficient documentation

## 2014-07-18 DIAGNOSIS — Z79899 Other long term (current) drug therapy: Secondary | ICD-10-CM | POA: Insufficient documentation

## 2014-07-18 DIAGNOSIS — K219 Gastro-esophageal reflux disease without esophagitis: Secondary | ICD-10-CM | POA: Diagnosis not present

## 2014-07-18 DIAGNOSIS — E860 Dehydration: Secondary | ICD-10-CM | POA: Diagnosis present

## 2014-07-18 DIAGNOSIS — Z8673 Personal history of transient ischemic attack (TIA), and cerebral infarction without residual deficits: Secondary | ICD-10-CM | POA: Insufficient documentation

## 2014-07-18 DIAGNOSIS — J45909 Unspecified asthma, uncomplicated: Secondary | ICD-10-CM | POA: Diagnosis not present

## 2014-07-18 DIAGNOSIS — K5900 Constipation, unspecified: Secondary | ICD-10-CM | POA: Diagnosis not present

## 2014-07-18 DIAGNOSIS — Z7982 Long term (current) use of aspirin: Secondary | ICD-10-CM | POA: Diagnosis not present

## 2014-07-18 DIAGNOSIS — R110 Nausea: Secondary | ICD-10-CM | POA: Insufficient documentation

## 2014-07-18 DIAGNOSIS — F329 Major depressive disorder, single episode, unspecified: Secondary | ICD-10-CM | POA: Insufficient documentation

## 2014-07-18 DIAGNOSIS — I10 Essential (primary) hypertension: Secondary | ICD-10-CM | POA: Insufficient documentation

## 2014-07-18 DIAGNOSIS — IMO0002 Reserved for concepts with insufficient information to code with codable children: Secondary | ICD-10-CM | POA: Insufficient documentation

## 2014-07-18 DIAGNOSIS — F3289 Other specified depressive episodes: Secondary | ICD-10-CM | POA: Diagnosis not present

## 2014-07-18 DIAGNOSIS — E669 Obesity, unspecified: Secondary | ICD-10-CM | POA: Insufficient documentation

## 2014-07-18 LAB — URINALYSIS, ROUTINE W REFLEX MICROSCOPIC
Bilirubin Urine: NEGATIVE
Glucose, UA: NEGATIVE mg/dL
Hgb urine dipstick: NEGATIVE
Ketones, ur: 15 mg/dL — AB
Nitrite: NEGATIVE
Protein, ur: NEGATIVE mg/dL
Specific Gravity, Urine: 1.009 (ref 1.005–1.030)
UROBILINOGEN UA: 0.2 mg/dL (ref 0.0–1.0)
pH: 6 (ref 5.0–8.0)

## 2014-07-18 LAB — CBC WITH DIFFERENTIAL/PLATELET
BASOS PCT: 1 % (ref 0–1)
Basophils Absolute: 0.1 10*3/uL (ref 0.0–0.1)
EOS ABS: 0.7 10*3/uL (ref 0.0–0.7)
Eosinophils Relative: 11 % — ABNORMAL HIGH (ref 0–5)
HEMATOCRIT: 36.4 % (ref 36.0–46.0)
Hemoglobin: 12.3 g/dL (ref 12.0–15.0)
Lymphocytes Relative: 19 % (ref 12–46)
Lymphs Abs: 1.2 10*3/uL (ref 0.7–4.0)
MCH: 30.4 pg (ref 26.0–34.0)
MCHC: 33.8 g/dL (ref 30.0–36.0)
MCV: 90.1 fL (ref 78.0–100.0)
MONO ABS: 0.3 10*3/uL (ref 0.1–1.0)
MONOS PCT: 5 % (ref 3–12)
Neutro Abs: 4.1 10*3/uL (ref 1.7–7.7)
Neutrophils Relative %: 65 % (ref 43–77)
Platelets: 180 10*3/uL (ref 150–400)
RBC: 4.04 MIL/uL (ref 3.87–5.11)
RDW: 13.3 % (ref 11.5–15.5)
WBC: 6.4 10*3/uL (ref 4.0–10.5)

## 2014-07-18 LAB — COMPREHENSIVE METABOLIC PANEL
ALT: 14 U/L (ref 0–35)
ANION GAP: 15 (ref 5–15)
AST: 19 U/L (ref 0–37)
Albumin: 3.9 g/dL (ref 3.5–5.2)
Alkaline Phosphatase: 100 U/L (ref 39–117)
BILIRUBIN TOTAL: 1.4 mg/dL — AB (ref 0.3–1.2)
BUN: 13 mg/dL (ref 6–23)
CHLORIDE: 104 meq/L (ref 96–112)
CO2: 24 mEq/L (ref 19–32)
CREATININE: 0.74 mg/dL (ref 0.50–1.10)
Calcium: 10 mg/dL (ref 8.4–10.5)
GFR calc Af Amer: >90 mL/min (ref >90)
GFR calc non Af Amer: 90 mL/min — ABNORMAL LOW (ref >90)
Glucose, Bld: 89 mg/dL (ref 70–99)
Potassium: 3.6 mEq/L — ABNORMAL LOW (ref 3.7–5.3)
Sodium: 143 mEq/L (ref 137–147)
TOTAL PROTEIN: 7.5 g/dL (ref 6.0–8.3)

## 2014-07-18 LAB — LIPASE, BLOOD: Lipase: 29 U/L (ref 11–59)

## 2014-07-18 LAB — URINE MICROSCOPIC-ADD ON

## 2014-07-18 MED ORDER — POLYETHYLENE GLYCOL 3350 17 GM/SCOOP PO POWD: 1.0000 | Freq: Once | ORAL | Status: DC

## 2014-07-18 NOTE — ED Notes (Signed)
Constipation and dehydration. Hurts when trying to have a bm.

## 2014-07-18 NOTE — ED Provider Notes (Signed)
Medical screening examination/treatment/procedure(s) were performed by non-physician practitioner and as supervising physician I was immediately available for consultation/collaboration.   EKG Interpretation None       Kalman Drape, MD 07/18/14 2128

## 2014-07-18 NOTE — ED Provider Notes (Signed)
CSN: [id number]     Arrival date & time 07/18/14  0307 History   First MD Initiated Contact with Patient 07/18/14 0606     Chief Complaint  Patient presents with  . Constipation  . Dehydration   Patient is a 63 y.o. female presenting with constipation. The history is provided by the patient. No language interpreter was used.  Constipation Severity:  Moderate Time since last bowel movement:  1 day Timing:  Constant Progression:  Partially resolved Chronicity:  Recurrent (Per patients report she has had intermittent problems with  constipation since stroke in december of last year) Context: dehydration, medication and narcotics   Context: not dietary changes and not stress   Stool description:  Hard Unusual stool frequency:  1 per everyday to every other day Relieved by:  Defecation and stool softeners Worsened by:  Nothing tried Ineffective treatments:  None tried Associated symptoms: nausea   Associated symptoms: no abdominal pain, no anorexia, no back pain, no diarrhea, no dysuria, no fever, no flatus, no hematochezia, no urinary retention and no vomiting   Risk factors: hx of abdominal surgery and obesity   Risk factors: no change in medication, no recent antibiotic use, no recent illness, no recent surgery and no recent travel     Past Medical History  Diagnosis Date  . Hypertension   . GERD (gastroesophageal reflux disease)   . Allergic rhinitis   . Asthma   . Obesity   . Depression   . History of colonic polyps   . DJD (degenerative joint disease), cervical   . 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 Stroke    Past Surgical History  Procedure Laterality Date  . Nasal sinus surgery  07/2006    Dr. [name]  . Nasal polyp surgery  07/2006    x 2 with Dr. [name]  . Foot surgery  1998  . Tubal ligation     Family History  Problem Relation Age of Onset  . Colon cancer Brother   . Diabetes Sister    History  Substance Use Topics  . Smoking status: Never Smoker    . Smokeless tobacco: Never Used  . Alcohol Use: No   OB History   Grav Para Term Preterm Abortions TAB SAB Ect Mult Living                 Review of Systems  Constitutional: Negative for fever.  Gastrointestinal: Positive for nausea and constipation. Negative for vomiting, abdominal pain, diarrhea, hematochezia, anorexia and flatus.  Genitourinary: Negative for dysuria.  Musculoskeletal: Negative for back pain.      Allergies  Ivp dye; Promethazine-codeine; and Tramadol  Home Medications   Prior to Admission medications   Medication Sig Start Date End Date Taking? Authorizing Provider  albuterol (PROVENTIL HFA;VENTOLIN HFA) 108 (90 BASE) MCG/ACT inhaler Inhale 2 puffs into the lungs every 6 (six) hours as needed for wheezing or shortness of breath.   Yes Historical Provider, MD  amLODipine (NORVASC) 5 MG tablet Take 5 mg by mouth daily.   Yes Historical Provider, MD  aspirin 325 MG tablet Take 325 mg by mouth daily.   Yes Historical Provider, MD  baclofen (LIORESAL) 10 MG tablet Take 10 mg by mouth at bedtime. 06/09/14  Yes Philmore Pali, NP  Biotin 5000 MCG CAPS Take 1 capsule by mouth daily.   Yes Historical Provider, MD  cetirizine (ZYRTEC) 10 MG tablet Take 10 mg by mouth at bedtime.    Yes Historical Provider, MD  clotrimazole (  MYCELEX) 10 MG troche Take 10 mg by mouth 4 (four) times daily as needed (thrush). 04/06/14  Yes Tanda Rockers, MD  fluticasone (FLONASE) 50 MCG/ACT nasal spray Place 2 sprays into both nostrils 2 (two) times daily.   Yes Historical Provider, MD  gabapentin (NEURONTIN) 100 MG capsule Take 100 mg by mouth 3 (three) times daily as needed (pain).  05/26/14  Yes Thurnell Lose, MD  mometasone-formoterol (DULERA) 100-5 MCG/ACT AERO Inhale 2 puffs into the lungs 2 (two) times daily.   Yes Historical Provider, MD  montelukast (SINGULAIR) 10 MG tablet Take 10 mg by mouth at bedtime.   Yes Historical Provider, MD  omeprazole (PRILOSEC) 20 MG capsule Take 20 mg by  mouth every morning.   Yes Historical Provider, MD  oxybutynin (DITROPAN) 5 MG tablet Take 5 mg by mouth at bedtime.   Yes Historical Provider, MD  oxymetazoline (AFRIN) 0.05 % nasal spray Place 2 sprays into both nostrils 2 (two) times daily as needed for congestion.   Yes Historical Provider, MD  pravastatin (PRAVACHOL) 80 MG tablet Take 80 mg by mouth at bedtime.   Yes Historical Provider, MD  ranitidine (ZANTAC) 150 MG tablet Take 150 mg by mouth at bedtime as needed for heartburn.   Yes Historical Provider, MD  spironolactone (ALDACTONE) 25 MG tablet Take 25 mg by mouth daily.   Yes Historical Provider, MD  amoxicillin-clavulanate (AUGMENTIN) 875-125 MG per tablet Take 1 tablet by mouth 2 (two) times daily.    Historical Provider, MD  polyethylene glycol powder (GLYCOLAX/MIRALAX) powder Take 255 g (1 Container total) by mouth once. 07/18/14   Oriel Ojo A Forcucci, PA-C   BP 117/72  Pulse 75  Temp(Src) 97.6 F (36.4 C) (Oral)  Resp 16  Ht 4\' 11"  (1.499 m)  Wt 181 lb (82.101 kg)  BMI 36.54 kg/m2  SpO2 98% Physical Exam  Nursing note and vitals reviewed. Constitutional: She is oriented to person, place, and time. She appears well-developed and well-nourished. No distress.  HENT:  Head: Normocephalic and atraumatic.  Mouth/Throat: Oropharynx is clear and moist. No oropharyngeal exudate.  Eyes: Conjunctivae are normal. Pupils are equal, round, and reactive to light. No scleral icterus.  Neck: Normal range of motion. Neck supple. No JVD present. No thyromegaly present.  Cardiovascular: Normal rate, regular rhythm, normal heart sounds and intact distal pulses.  Exam reveals no gallop and no friction rub.   No murmur heard. Pulmonary/Chest: Effort normal and breath sounds normal. No respiratory distress. She has no wheezes. She has no rales. She exhibits no tenderness.  Abdominal: Soft. Bowel sounds are normal. She exhibits no distension and no mass. There is no tenderness. There is no  rebound and no guarding.  Musculoskeletal: Normal range of motion.  Lymphadenopathy:    She has no cervical adenopathy.  Neurological: She is alert and oriented to person, place, and time. No cranial nerve deficit.  Skin: Skin is warm and dry. She is not diaphoretic.  Psychiatric: She has a normal mood and affect. Her behavior is normal. Judgment and thought content normal.    ED Course  Procedures (including critical care time) Labs Review Labs Reviewed  CBC WITH DIFFERENTIAL - Abnormal; Notable for the following:    Eosinophils Relative 11 (*)    All other components within normal limits  COMPREHENSIVE METABOLIC PANEL - Abnormal; Notable for the following:    Potassium 3.6 (*)    Total Bilirubin 1.4 (*)    GFR calc non Af Amer 90 (*)  All other components within normal limits  URINALYSIS, ROUTINE W REFLEX MICROSCOPIC - Abnormal; Notable for the following:    Ketones, ur 15 (*)    Leukocytes, UA TRACE (*)    All other components within normal limits  URINE MICROSCOPIC-ADD ON - Abnormal; Notable for the following:    Squamous Epithelial / LPF FEW (*)    All other components within normal limits  LIPASE, BLOOD    Imaging Review Dg Abd 2 Views  07/18/2014   CLINICAL DATA:  Left lower quadrant pain and nausea  EXAM: ABDOMEN - 2 VIEW  COMPARISON:  12/02/2009  FINDINGS: Normal bowel gas pattern. No evidence of obstruction. There are bilateral tubal ligation clips. Soft tissues are otherwise unremarkable no significant bony abnormality.  IMPRESSION: Negative.   Electronically Signed   By: Lajean Manes M.D.   On: 07/18/2014 07:22     EKG Interpretation None      MDM   Final diagnoses:  Constipation, unspecified constipation type   Patient is a 63 y.o. Female who presents to the ED with constipation x 1 day.  Patient had a BM movement here in the ED after taking a stool softener at home and reports some relief of symptoms upon initial interview.  CBC, CMP, Lipase, and UA show  no acute abnormalities at this time.  Abdominal xray shows no signs of bowel obstruction at this time, but does show constipation.  Patient to be given a prescription for miralax to take at home.  Patient is stable for discharge at this time.  Patient was told to return to the ED with intolerance of PO intake, abdominal pain, and GI bleeding.  Patient states understanding and agreement at this time.      Cherylann Parr, PA-C 07/18/14 [phone]

## 2014-07-18 NOTE — Discharge Instructions (Signed)

## 2014-08-04 ENCOUNTER — Telehealth: Payer: Self-pay | Admitting: Internal Medicine

## 2014-08-04 MED ORDER — MOMETASONE FURO-FORMOTEROL FUM 100-5 MCG/ACT IN AERO: 2.0000 | INHALATION_SPRAY | Freq: Two times a day (BID) | RESPIRATORY_TRACT | Status: DC

## 2014-08-04 NOTE — Telephone Encounter (Signed)
Spoke with pt in lobby--given samples to last her 1 month to give [place] time to figure out what is going on with her insurance cov'g Pt states that she was informed by her pharmacy that they will not cover her [facility] any longer.  I advised the patient that I would speak with her pharmacy to see what exactly is going on.  --------- Called and spoke with pharmacist at [facility] [address].  Per pharmacist, he ran Rx through insurance and it kicked back a message stating that the patient owed on her [employer]. It is not necessarily her insurance denying coverage of the Rx it is moreso the fact that she has not paid on her Premium so they are refusing to cover anything at this time until that it is paid. Pharmacy unsure if Ruthe Mannan will be covered once Premium is paid, it will have to be ran back through insurance at that time to check. --------- Spoke with pt---aware of information above. Will contact insurance company. Pt to contact our office once she has spoken with insurance.   Nothing further needed.

## 2014-08-05 ENCOUNTER — Ambulatory Visit (INDEPENDENT_AMBULATORY_CARE_PROVIDER_SITE_OTHER): Payer: 59 | Admitting: Internal Medicine

## 2014-08-05 ENCOUNTER — Encounter: Payer: Self-pay | Admitting: Internal Medicine

## 2014-08-05 VITALS — BP 110/72 | HR 73 | Ht 59.0 in | Wt 177.8 lb

## 2014-08-05 DIAGNOSIS — I6330 Cerebral infarction due to thrombosis of unspecified cerebral artery: Secondary | ICD-10-CM

## 2014-08-05 NOTE — Patient Instructions (Signed)
Janan Halter, RN, Dr. Jackalyn Lombard primary nurse will call you with information about the RIO 2 study

## 2014-08-05 NOTE — Progress Notes (Signed)
PCP: Cathlean Cower, MD  Andrea Mason is a 63 y.o. female who presents today for routine electrophysiology followup.  Since last being seen in our clinic, the patient reports doing very well.  Today, she denies symptoms of palpitations, chest pain, shortness of breath,  lower extremity edema, dizziness, presyncope, or syncope.  The patient is otherwise without complaint today.   Past Medical History  Diagnosis Date  . Hypertension   . GERD (gastroesophageal reflux disease)   . Allergic rhinitis   . Asthma   . Obesity   . Depression   . History of colonic polyps   . DJD (degenerative joint disease), cervical   . 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 Stroke    Past Surgical History  Procedure Laterality Date  . Nasal sinus surgery  07/2006    Dr. [name]  . Nasal polyp surgery  07/2006    x 2 with Dr. [name]  . Foot surgery  1998  . Tubal ligation      ROS- all systems are reviewed and negatives except as per HPI above  Current Outpatient Prescriptions  Medication Sig Dispense Refill  . albuterol (PROVENTIL HFA;VENTOLIN HFA) 108 (90 BASE) MCG/ACT inhaler Inhale 2 puffs into the lungs every 6 (six) hours as needed for wheezing or shortness of breath.      Marland Kitchen amLODipine (NORVASC) 5 MG tablet Take 5 mg by mouth daily.      Marland Kitchen aspirin 325 MG tablet Take 325 mg by mouth daily.      . baclofen (LIORESAL) 10 MG tablet Take 10 mg by mouth at bedtime.      . Biotin 5000 MCG CAPS Take 1 capsule by mouth daily.      . cetirizine (ZYRTEC) 10 MG tablet Take 10 mg by mouth at bedtime.       . clotrimazole (MYCELEX) 10 MG troche Take 10 mg by mouth 4 (four) times daily as needed (thrush).      . fluticasone (FLONASE) 50 MCG/ACT nasal spray Place 2 sprays into both nostrils 2 (two) times daily.      Marland Kitchen gabapentin (NEURONTIN) 100 MG capsule Take 100 mg by mouth 3 (three) times daily as needed (pain).       . mometasone-formoterol (DULERA) 100-5 MCG/ACT AERO Inhale 2 puffs into the lungs 2 (two)  times daily.  2 Inhaler  0  . montelukast (SINGULAIR) 10 MG tablet Take 10 mg by mouth at bedtime.      Marland Kitchen omeprazole (PRILOSEC) 20 MG capsule Take 20 mg by mouth every morning.      Marland Kitchen oxybutynin (DITROPAN) 5 MG tablet Take 5 mg by mouth at bedtime.      Marland Kitchen oxymetazoline (AFRIN) 0.05 % nasal spray Place 2 sprays into both nostrils 2 (two) times daily as needed for congestion.      . polyethylene glycol powder (GLYCOLAX/MIRALAX) powder Take 255 g (1 Container total) by mouth once.  255 g  0  . pravastatin (PRAVACHOL) 80 MG tablet Take 80 mg by mouth at bedtime.      . ranitidine (ZANTAC) 150 MG tablet Take 150 mg by mouth at bedtime as needed for heartburn.      . spironolactone (ALDACTONE) 25 MG tablet Take 25 mg by mouth daily.       No current facility-administered medications for this visit.    Physical Exam: Filed Vitals:   08/05/14 1235  BP: 110/72  Pulse: 73  Height: 4\' 11"  (1.499 m)  Weight: 177 lb 12.8 oz (  80.65 kg)    GEN- The patient is well appearing, alert and oriented x 3 today.   Head- normocephalic, atraumatic Eyes-  Sclera clear, conjunctiva pink Ears- hearing intact Oropharynx- clear Lungs- Clear to ausculation bilaterally, normal work of breathing Heart- Regular rate and rhythm, no murmurs, rubs or gallops, PMI not laterally displaced GI- soft, NT, ND, + BS Extremities- no clubbing, cyanosis, or edema  Event monitor is reviewed and revealed no afib  Assessment and Plan:  1. Cryptogenic stroke The patient presents with cryptogenic stroke.  She wore a 30 day monitor recently which did not reveal any atrial arrhythmias.  Today we spoke about the potential value of more prolonged monitoring.  Risks, benefits, and alteratives to implantable loop recorder were discussed with the patient today.   At this time, the patient is very clear in their decision to proceed with implantable loop recorder.   I will enroll in RIO II study once available.

## 2014-08-06 ENCOUNTER — Telehealth: Payer: Self-pay | Admitting: Internal Medicine

## 2014-08-06 ENCOUNTER — Ambulatory Visit (INDEPENDENT_AMBULATORY_CARE_PROVIDER_SITE_OTHER)
Admission: RE | Admit: 2014-08-06 | Discharge: 2014-08-06 | Disposition: A | Discharge status: Home / Self Care | Payer: 59 | Source: Ambulatory Visit | Attending: Internal Medicine | Admitting: Internal Medicine

## 2014-08-06 ENCOUNTER — Encounter: Payer: Self-pay | Admitting: Internal Medicine

## 2014-08-06 ENCOUNTER — Ambulatory Visit (INDEPENDENT_AMBULATORY_CARE_PROVIDER_SITE_OTHER): Payer: 59 | Admitting: Internal Medicine

## 2014-08-06 VITALS — BP 114/82 | HR 70 | Temp 98.2°F | Wt 177.2 lb

## 2014-08-06 DIAGNOSIS — M25473 Effusion, unspecified ankle: Secondary | ICD-10-CM

## 2014-08-06 DIAGNOSIS — M25476 Effusion, unspecified foot: Secondary | ICD-10-CM

## 2014-08-06 DIAGNOSIS — M25471 Effusion, right ankle: Secondary | ICD-10-CM

## 2014-08-06 DIAGNOSIS — M79674 Pain in right toe(s): Secondary | ICD-10-CM | POA: Insufficient documentation

## 2014-08-06 DIAGNOSIS — M79609 Pain in unspecified limb: Secondary | ICD-10-CM

## 2014-08-06 DIAGNOSIS — L84 Corns and callosities: Secondary | ICD-10-CM | POA: Insufficient documentation

## 2014-08-06 DIAGNOSIS — Z23 Encounter for immunization: Secondary | ICD-10-CM

## 2014-08-06 DIAGNOSIS — M2040 Other hammer toe(s) (acquired), unspecified foot: Secondary | ICD-10-CM | POA: Insufficient documentation

## 2014-08-06 NOTE — Progress Notes (Signed)
Pre visit review using our clinic review tool, if applicable. No additional management support is needed unless otherwise documented below in the visit note. 

## 2014-08-06 NOTE — Telephone Encounter (Signed)
Patient received a bill from Cardiology.  They told her that she needed a referral from when she was in the hospital with a stroke back in December.  She spoke with Dr. Jenny Reichmann about this and Dr. Jenny Reichmann told her to speak with you about it.  Could you please give her a call. Thanks!

## 2014-08-06 NOTE — Patient Instructions (Addendum)
You had the flu shot today  Please continue all other medications as before, and refills have been done if requested.  Please have the pharmacy call with any other refills you may need.  Please keep your appointments with your specialists as you may have planned  OK to buddy tape the toe to help with the pain  Please go to the XRAY Department in the Basement (go straight as you get off the elevator) for the x-ray testing  You will be contacted by phone if any changes need to be made immediately.  Otherwise, you will receive a letter about your results with an explanation, but please check with MyChart first.  Please remember to sign up for MyChart if you have not done so, as this will be important to you in the future with finding out test results, communicating by private email, and scheduling acute appointments online when needed.

## 2014-08-06 NOTE — Progress Notes (Signed)
Subjective:    Patient ID: Andrea Mason, female    DOB: 25-Feb-1951, 63 y.o.   MRN: [id number]  HPI Here to f/u with 2 wks ongoing pain/swelling/bruising to right 2nd toe but little pain since she has chronic decreased sensation to right foot due to previous stroke.  Pain is sharp, worse with ambulation, better with sitting, now mild to mod but persistent.  Also with right lateral ankle swelling without significant pain from the same accident, but able to bear wt ok, no limping but again decreased sens due to prior cva.  Also incidentally mentions hammer toes right lateral foot, ? Need for surgury, needs podiatry referral.  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 Past Medical History  Diagnosis Date  . Hypertension   . GERD (gastroesophageal reflux disease)   . Allergic rhinitis   . Asthma   . Obesity   . Depression   . History of colonic polyps   . DJD (degenerative joint disease), cervical   . 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 Stroke    Past Surgical History  Procedure Laterality Date  . Nasal sinus surgery  07/2006    Dr. [name]  . Nasal polyp surgery  07/2006    x 2 with Dr. [name]  . Foot surgery  1998  . Tubal ligation      reports that she has never smoked. She has never used smokeless tobacco. She reports that she does not drink alcohol or use illicit drugs. family history includes Colon cancer in her brother; Diabetes in her sister. Allergies  Allergen Reactions  . Ivp Dye [Iodinated Diagnostic Agents] Nausea And Vomiting    Reaction: hot flashes  . Promethazine-Codeine Nausea And Vomiting  . Tramadol    Current Outpatient Prescriptions on File Prior to Visit  Medication Sig Dispense Refill  . albuterol (PROVENTIL HFA;VENTOLIN HFA) 108 (90 BASE) MCG/ACT inhaler Inhale 2 puffs into the lungs every 6 (six) hours as needed for wheezing or shortness of breath.        Marland Kitchen amLODipine (NORVASC) 5 MG tablet Take 5 mg by mouth daily.      Marland Kitchen aspirin 325 MG tablet Take 325 mg by mouth daily.      . baclofen (LIORESAL) 10 MG tablet Take 10 mg by mouth at bedtime.      . Biotin 5000 MCG CAPS Take 1 capsule by mouth daily.      . cetirizine (ZYRTEC) 10 MG tablet Take 10 mg by mouth at bedtime.       . clotrimazole (MYCELEX) 10 MG troche Take 10 mg by mouth 4 (four) times daily as needed (thrush).      . fluticasone (FLONASE) 50 MCG/ACT nasal spray Place 2 sprays into both nostrils 2 (two) times daily.      Marland Kitchen gabapentin (NEURONTIN) 100 MG capsule Take 100 mg by mouth 3 (three) times daily as needed (pain).       . mometasone-formoterol (DULERA) 100-5 MCG/ACT AERO Inhale 2 puffs into the lungs 2 (two) times daily.  2 Inhaler  0  . montelukast (SINGULAIR) 10 MG tablet Take 10 mg by mouth at bedtime.      Marland Kitchen omeprazole (PRILOSEC) 20 MG capsule Take 20 mg by mouth every morning.      Marland Kitchen oxybutynin (DITROPAN) 5 MG tablet Take 5 mg by mouth at bedtime.      Marland Kitchen oxymetazoline (AFRIN) 0.05 % nasal spray Place 2  sprays into both nostrils 2 (two) times daily as needed for congestion.      . polyethylene glycol powder (GLYCOLAX/MIRALAX) powder Take 255 g (1 Container total) by mouth once.  255 g  0  . pravastatin (PRAVACHOL) 80 MG tablet Take 80 mg by mouth at bedtime.      . ranitidine (ZANTAC) 150 MG tablet Take 150 mg by mouth at bedtime as needed for heartburn.      . spironolactone (ALDACTONE) 25 MG tablet Take 25 mg by mouth daily.       No current facility-administered medications on file prior to visit.   Review of Systems  Constitutional: Negative for unusual diaphoresis or other sweats  HENT: Negative for ringing in ear Eyes: Negative for double vision or worsening visual disturbance.  Respiratory: Negative for choking and stridor.   Gastrointestinal: Negative for vomiting or other signifcant bowel change Genitourinary: Negative for hematuria or decreased urine volume.   Musculoskeletal: Negative for other MSK pain or swelling Skin: Negative for color change and worsening wound.  Neurological: Negative for tremors and numbness other than noted  Psychiatric/Behavioral: Negative for decreased concentration or agitation other than above       Objective:   Physical Exam BP 114/82  Pulse 70  Temp(Src) 98.2 F (36.8 C) (Oral)  Wt 177 lb 4 oz (80.4 kg)  SpO2 98% VS noted,  Constitutional: Pt appears well-developed, well-nourished.  HENT: Head: NCAT.  Right Ear: External ear normal.  Left Ear: External ear normal.  Eyes: . Pupils are equal, round, and reactive to light. Conjunctivae and EOM are normal Neck: Normal range of motion. Neck supple.  Cardiovascular: Normal rate and regular rhythm.   Pulmonary/Chest: Effort normal and breath sounds normal.  Right foot with several lateral hammertoes, no ulcers.wounds Right second toe with 1-2+ bruise/swelling Right lateral ankle with nondiscrete mile lateral aspect swelling, nontender, o/w FROM, dorsalis pedis 1+ right Neurological: Pt is alert. Not confused , motor grossly intact Skin: Skin is warm. No rash but has callous heel Psychiatric: Pt behavior is normal. No agitation.     Assessment & Plan:

## 2014-08-09 NOTE — Assessment & Plan Note (Signed)
For podiatry referral 

## 2014-08-09 NOTE — Assessment & Plan Note (Signed)
Also for film, but less likley fx by exam, likely c/w soft tissue ankle sprain,  to f/u any worsening symptoms or concerns

## 2014-08-09 NOTE — Assessment & Plan Note (Signed)
prob fx, for film, buddy tape therapy

## 2014-08-09 NOTE — Assessment & Plan Note (Signed)
Also for podiatry referral 

## 2014-08-18 ENCOUNTER — Telehealth: Payer: Self-pay | Admitting: Internal Medicine

## 2014-08-18 NOTE — Telephone Encounter (Signed)
Called, spoke with pt - C/o white to green nasal discharge, wheezing, and increased SOB with activity.  No sinus pressure, HA, or chest tightness/pain.  Started taking mucinex.  Pt scheduled to see RA tomorrow, Sept 16, 2015 at 3:30 pm.  Pt confirmed appt, is to seek emergency care if needed, and voiced no further questions or concerns at this time.

## 2014-08-19 ENCOUNTER — Telehealth: Payer: Self-pay | Admitting: Internal Medicine

## 2014-08-19 ENCOUNTER — Ambulatory Visit (INDEPENDENT_AMBULATORY_CARE_PROVIDER_SITE_OTHER): Payer: 59 | Admitting: Pulmonary Disease

## 2014-08-19 ENCOUNTER — Encounter: Payer: Self-pay | Admitting: Pulmonary Disease

## 2014-08-19 VITALS — BP 102/68 | HR 84 | Ht 60.0 in | Wt 171.0 lb

## 2014-08-19 DIAGNOSIS — J0141 Acute recurrent pansinusitis: Secondary | ICD-10-CM

## 2014-08-19 DIAGNOSIS — J018 Other acute sinusitis: Secondary | ICD-10-CM

## 2014-08-19 MED ORDER — AZITHROMYCIN 250 MG PO TABS: ORAL_TABLET | ORAL | Status: DC

## 2014-08-19 NOTE — Patient Instructions (Addendum)
We will call in [place] for your sinusitis If no better, call back for augmentin Take  mucinex twice daily

## 2014-08-19 NOTE — Telephone Encounter (Signed)
New message          Pt is calling to set up an appt for her study per her last office visit

## 2014-08-19 NOTE — Telephone Encounter (Signed)
Spoke with patient and let her know we are still waiting for approal for study and will call her as soon as it's approved.  She was glad i call as her insurance has been messed up and thought that is why we had not called.  Shawn in research sent a message to ensure on her list for RIO

## 2014-08-19 NOTE — Assessment & Plan Note (Signed)
We will call in [place] for your sinusitis If no better, call back for augmentin Take  mucinex twice daily

## 2014-08-19 NOTE — Progress Notes (Signed)
   Subjective:    Patient ID: Andrea Mason, female    DOB: 11/20/1951, 63 y.o.   MRN: [id number]  HPI  29 yobf never smoker with chronic cough She was followed by Dr Annamaria Boots for allergic rhinitis and asthma , now by [facility]  02/05/2013 1st pulmonary eval in [facility] era baseline = completely better on qvar 80 2bid and only use rescue once a week at baseline then much worse starting 3/3 with cough/ congestion/ green mucus and rescue x one by time of ov at 2pm  Last OV 06/24/14 > augmentin for sinusitis  Chief Complaint  Patient presents with  . Acute Visit    MW pt. Pt c/o runny nose with green mucos, head congestion, runny eyes x 2 weeks. Pt states she is not coughing it is just in her head. Pt denies cough and CP/tightness.    On dulera, C/o head cold, has had 2 sinus surgeries, cannot see ENT since 'owes them money' No wheeze, or cough MRI 05/2014 showed pan sinusitis   Review of Systems neg for any significant sore throat, dysphagia, itching, sneezing, nasal congestion or excess/ purulent secretions, fever, chills, sweats, unintended wt loss, pleuritic or exertional cp, hempoptysis, orthopnea pnd or change in chronic leg swelling. Also denies presyncope, palpitations, heartburn, abdominal pain, nausea, vomiting, diarrhea or change in bowel or urinary habits, dysuria,hematuria, rash, arthralgias, visual complaints, headache, numbness weakness or ataxia.     Objective:   Physical Exam  Gen. Pleasant, well-nourished, in no distress ENT - no lesions, no post nasal drip, no sinus tenderness Neck: No JVD, no thyromegaly, no carotid bruits Lungs: no use of accessory muscles, no dullness to percussion, clear without rales or rhonchi  Cardiovascular: Rhythm regular, heart sounds  normal, no murmurs or gallops, no peripheral edema Musculoskeletal: No deformities, no cyanosis or clubbing        Assessment & Plan:

## 2014-08-24 ENCOUNTER — Telehealth: Payer: Self-pay | Admitting: Pulmonary Disease

## 2014-08-24 ENCOUNTER — Telehealth: Payer: Self-pay | Admitting: Internal Medicine

## 2014-08-24 MED ORDER — CEPHALEXIN 500 MG PO CAPS: 500.0000 mg | ORAL_CAPSULE | Freq: Two times a day (BID) | ORAL | Status: DC

## 2014-08-24 NOTE — Telephone Encounter (Signed)
Called made pt aware of recs. RX sent in. Nothing further needed 

## 2014-08-24 NOTE — Telephone Encounter (Signed)
Per 08/19/14: Patient Instructions      We will call in [place] for your sinusitis If no better, call back for augmentin Take  mucinex twice daily  --  Called spoke with pt. She reports her insurance does not help with her RX's. She brought in list that she can get free ABX's from [employer]. Amoxicillin, cephalexin, cipro, pcn. Pt never picked up [place]. Please advise Dr. Elsworth Soho thanks  Allergies  Allergen Reactions  . Ivp Dye [Iodinated Diagnostic Agents] Nausea And Vomiting    Reaction: hot flashes  . Promethazine-Codeine Nausea And Vomiting  . Tramadol

## 2014-08-24 NOTE — Telephone Encounter (Signed)
Pt came by office to request that her current medications be changed to generic brands and sent to her pharmacy at [facility] [phone].

## 2014-08-24 NOTE — Telephone Encounter (Signed)
Cephalexin 500 twice a day x 7 days

## 2014-08-25 NOTE — Telephone Encounter (Signed)
I reviewed the med list  All of her meds are already generic except the [facility], which does not come in generic  OK to let pt know to ask for generic if this is not what she is getting at the pharmacy

## 2014-08-25 NOTE — Telephone Encounter (Signed)
Patient informed of MD instructions on medications.

## 2014-08-27 ENCOUNTER — Telehealth: Payer: Self-pay

## 2014-08-27 NOTE — Telephone Encounter (Signed)
Patient informed to pickup handicap placard at the front desk at her convenience

## 2014-09-04 ENCOUNTER — Telehealth: Payer: Self-pay | Admitting: Internal Medicine

## 2014-09-04 ENCOUNTER — Telehealth: Payer: Self-pay | Admitting: *Deleted

## 2014-09-04 ENCOUNTER — Other Ambulatory Visit: Payer: 59

## 2014-09-04 NOTE — Telephone Encounter (Signed)
Patient states she will be out of oxybutynin and spironolactone for 6 weeks.  She will have an organization to help get these after six weeks.  She would like to know if there is something she can do to get through until then.

## 2014-09-04 NOTE — Research (Signed)
RIO 2 Informed Consent   Subject Name: Andrea Mason  Subject met inclusion and exclusion criteria.  The informed consent form, study requirements and expectations were reviewed with the subject and questions and concerns were addressed prior to the signing of the consent form.  The subject verbalized understanding of the trail requirements.  The subject agreed to participate in the RIO 2 trial and signed the informed consent.  The informed consent was obtained prior to performance of any protocol-specific procedures for the subject.  A copy of the signed informed consent was given to the subject and a copy was placed in the subject's medical record.  [name] 09/04/2014 11:45pm

## 2014-09-04 NOTE — Telephone Encounter (Signed)
RIO 2 Informed Consent   Subject Name: Andrea Mason  Subject met inclusion and exclusion criteria.  The informed consent form, study requirements and expectations were reviewed with the subject and questions and concerns were addressed prior to the signing of the consent form.  The subject verbalized understanding of the trail requirements.  The subject agreed to participate in the RIO 2 trial and signed the informed consent.  The informed consent was obtained prior to performance of any protocol-specific procedures for the subject.  A copy of the signed informed consent was given to the subject and a copy was placed in the subject's medical record.  Raquel Sarna Saory Carriero 09/04/2014

## 2014-09-08 ENCOUNTER — Encounter: Payer: Self-pay | Admitting: Internal Medicine

## 2014-09-08 ENCOUNTER — Ambulatory Visit (INDEPENDENT_AMBULATORY_CARE_PROVIDER_SITE_OTHER): Payer: 59 | Admitting: Internal Medicine

## 2014-09-08 VITALS — BP 108/72 | HR 68 | Temp 97.8°F | Wt 170.0 lb

## 2014-09-08 DIAGNOSIS — R229 Localized swelling, mass and lump, unspecified: Secondary | ICD-10-CM | POA: Insufficient documentation

## 2014-09-08 DIAGNOSIS — F32A Depression, unspecified: Secondary | ICD-10-CM

## 2014-09-08 DIAGNOSIS — F329 Major depressive disorder, single episode, unspecified: Secondary | ICD-10-CM

## 2014-09-08 DIAGNOSIS — I10 Essential (primary) hypertension: Secondary | ICD-10-CM

## 2014-09-08 NOTE — Telephone Encounter (Signed)
Unfort, these are already generic and are specific medications that dont have any close relatives in the same class that would be more affordable.

## 2014-09-08 NOTE — Patient Instructions (Signed)
Please continue all other medications as before  Please continue your weight loss efforts; but if you lose further weight, and your blood pressure is improved such as < 100 for the top number, please call me or Dr Rayann Heman as we may be able to decrease or stop the amlodipine  Please have the pharmacy call with any other refills you may need.  Please continue your efforts at being more active, low cholesterol diet, and weight control.  Please keep your appointments with your specialists as you may have planned - Oct 14 with Dr Rayann Heman

## 2014-09-08 NOTE — Telephone Encounter (Signed)
Patient informed and stated she has gotten the medications already.   Did scheduled with Dr. Jenny Reichmann tonight at 6 for possible clots in leg.

## 2014-09-08 NOTE — Progress Notes (Signed)
Subjective:    Patient ID: Andrea Mason, female    DOB: 1951-04-12, 63 y.o.   MRN: [id number]  HPI  Here to fu, has Lost wt intentinally from 219 last yr with the stroke, to current 170.  Has noticed diffuse extremities subq lumpiness with the wt loss that makes her concerned about some kind of blood clots.  Pt denies chest pain, increased sob or doe, wheezing, orthopnea, PND, increased LE swelling, palpitations, dizziness or syncope.  Pt denies new neurological symptoms such as new headache, or facial or extremity weakness or numbness   Pt denies polydipsia, polyuria, Denies worsening depressive symptoms, suicidal ideation, or panic.  For loop recorder soon per cardiology Past Medical History  Diagnosis Date  . Hypertension   . GERD (gastroesophageal reflux disease)   . Allergic rhinitis   . Asthma   . Obesity   . Depression   . History of colonic polyps   . DJD (degenerative joint disease), cervical   . 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 Stroke    Past Surgical History  Procedure Laterality Date  . Nasal sinus surgery  07/2006    Dr. [name]  . Nasal polyp surgery  07/2006    x 2 with Dr. [name]  . Foot surgery  1998  . Tubal ligation      reports that she has never smoked. She has never used smokeless tobacco. She reports that she does not drink alcohol or use illicit drugs. family history includes Colon cancer in her brother; Diabetes in her sister. Allergies  Allergen Reactions  . Ivp Dye [Iodinated Diagnostic Agents] Nausea And Vomiting    Reaction: hot flashes  . Promethazine-Codeine Nausea And Vomiting  . Tramadol    Current Outpatient Prescriptions on File Prior to Visit  Medication Sig Dispense Refill  . albuterol (PROVENTIL HFA;VENTOLIN HFA) 108 (90 BASE) MCG/ACT inhaler Inhale 2 puffs into the lungs every 6 (six) hours as needed for wheezing or shortness of breath.      Marland Kitchen amLODipine (NORVASC) 5 MG tablet Take 5 mg by mouth daily.      Marland Kitchen aspirin 325 MG  tablet Take 325 mg by mouth daily.      . baclofen (LIORESAL) 10 MG tablet Take 10 mg by mouth 3 times/day as needed-between meals & bedtime.       . Biotin 5000 MCG CAPS Take 1 capsule by mouth daily.      . cetirizine (ZYRTEC) 10 MG tablet Take 10 mg by mouth at bedtime.       . clotrimazole (MYCELEX) 10 MG troche Take 10 mg by mouth 4 (four) times daily as needed (thrush).      Marland Kitchen dextromethorphan-guaiFENesin (MUCINEX DM) 30-600 MG per 12 hr tablet Take 1 tablet by mouth 2 (two) times daily.      . fluticasone (FLONASE) 50 MCG/ACT nasal spray Place 2 sprays into both nostrils 2 (two) times daily.      Marland Kitchen gabapentin (NEURONTIN) 100 MG capsule Take 100 mg by mouth 3 (three) times daily as needed (pain).       . mometasone-formoterol (DULERA) 100-5 MCG/ACT AERO Inhale 2 puffs into the lungs 2 (two) times daily.  2 Inhaler  0  . montelukast (SINGULAIR) 10 MG tablet Take 10 mg by mouth at bedtime.      Marland Kitchen omeprazole (PRILOSEC) 20 MG capsule Take 20 mg by mouth every morning.      Marland Kitchen oxybutynin (DITROPAN) 5 MG tablet Take 5 mg by mouth at bedtime.      Marland Kitchen  oxymetazoline (AFRIN) 0.05 % nasal spray Place 2 sprays into both nostrils 2 (two) times daily as needed for congestion.      . polyethylene glycol powder (GLYCOLAX/MIRALAX) powder Take 255 g (1 Container total) by mouth once.  255 g  0  . pravastatin (PRAVACHOL) 80 MG tablet Take 80 mg by mouth at bedtime.      . ranitidine (ZANTAC) 150 MG tablet Take 150 mg by mouth at bedtime as needed for heartburn.      . spironolactone (ALDACTONE) 25 MG tablet Take 25 mg by mouth daily.       No current facility-administered medications on file prior to visit.       Review of Systems  Constitutional: Negative for unusual diaphoresis or other sweats  HENT: Negative for ringing in ear Eyes: Negative for double vision or worsening visual disturbance.  Respiratory: Negative for choking and stridor.   Gastrointestinal: Negative for vomiting or other signifcant  bowel change Genitourinary: Negative for hematuria or decreased urine volume.  Musculoskeletal: Negative for other MSK pain or swelling Skin: Negative for color change and worsening wound.  Neurological: Negative for tremors and numbness other than noted  Psychiatric/Behavioral: Negative for decreased concentration or agitation other than above       Objective:   Physical Exam BP 108/72  Pulse 68  Temp(Src) 97.8 F (36.6 C) (Oral)  Wt 170 lb (77.111 kg)  SpO2 96% VS noted, not ill appearing Constitutional: Pt appears well-developed, well-nourished.  HENT: Head: NCAT.  Right Ear: External ear normal.  Left Ear: External ear normal.  Eyes: . Pupils are equal, round, and reactive to light. Conjunctivae and EOM are normal Neck: Normal range of motion. Neck supple.  Cardiovascular: Normal rate and regular rhythm.   Pulmonary/Chest: Effort normal and breath sounds normal.  Abd:  Soft, NT, ND, + BS Neurological: Pt is alert. Not confused , motor grossly intact Skin: Skin is warm. No rash, color changes, swelling, ulcer or drainage or tenderness. Has diffuse subq very small lumps to arms and legs, NT Psychiatric: Pt behavior is normal. No agitation. not depressed affect Wt Readings from Last 3 Encounters:  09/08/14 170 lb (77.111 kg)  08/19/14 171 lb (77.565 kg)  08/06/14 177 lb 4 oz (80.4 kg)       Assessment & Plan:

## 2014-09-08 NOTE — Progress Notes (Signed)
Pre visit review using our clinic review tool, if applicable. No additional management support is needed unless otherwise documented below in the visit note. 

## 2014-09-09 ENCOUNTER — Telehealth: Payer: Self-pay | Admitting: Internal Medicine

## 2014-09-09 MED ORDER — ASPIRIN 325 MG PO TABS: 325.0000 mg | ORAL_TABLET | Freq: Every day | ORAL | Status: DC

## 2014-09-09 MED ORDER — OXYBUTYNIN CHLORIDE ER 5 MG PO TB24: 5.0000 mg | ORAL_TABLET | Freq: Every day | ORAL | Status: DC

## 2014-09-09 MED ORDER — ESOMEPRAZOLE MAGNESIUM 40 MG PO CPDR: 40.0000 mg | DELAYED_RELEASE_CAPSULE | Freq: Every day | ORAL | Status: DC

## 2014-09-09 NOTE — Assessment & Plan Note (Signed)
stable overall by history and exam, recent data reviewed with pt, and pt to continue medical treatment as before,  to f/u any worsening symptoms or concerns BP Readings from Last 3 Encounters:  09/08/14 108/72  08/19/14 102/68  08/06/14 114/82

## 2014-09-09 NOTE — Telephone Encounter (Signed)
This usually means blood under the nail, which often takes several wks to go away

## 2014-09-09 NOTE — Telephone Encounter (Signed)
meds changed to free meds for pt through MAP program

## 2014-09-09 NOTE — Telephone Encounter (Signed)
Patient informed scripts sent to the MAP program, asa 325, oxybutynin and esomeprazole.    The patient will be unable to go to Podiatrist appt. Due to cost.  She does have a problem with her left foot, big toe. Under the nail it is black. Advise please or ov to evaluate.

## 2014-09-09 NOTE — Telephone Encounter (Signed)
Called left message to call back 

## 2014-09-09 NOTE — Assessment & Plan Note (Signed)
stable overall by history and exam, recent data reviewed with pt, and pt to continue medical treatment as before,  to f/u any worsening symptoms or concerns Lab Results  Component Value Date   WBC 6.4 07/18/2014   HGB 12.3 07/18/2014   HCT 36.4 07/18/2014   PLT 180 07/18/2014   GLUCOSE 89 07/18/2014   CHOL 90 06/02/2014   TRIG 38.0 06/02/2014   HDL 59.40 06/02/2014   LDLCALC 23 06/02/2014   ALT 14 07/18/2014   AST 19 07/18/2014   NA 143 07/18/2014   K 3.6* 07/18/2014   CL 104 07/18/2014   CREATININE 0.74 07/18/2014   BUN 13 07/18/2014   CO2 24 07/18/2014   TSH 1.11 06/02/2014   INR 1.07 05/25/2014   HGBA1C 5.8* 05/25/2014

## 2014-09-09 NOTE — Assessment & Plan Note (Signed)
Subq palpable, very small, TNTC to extremities assoc with wt loss - suspect this is normal subq fascial irregularity noted assoc with the wt loss, no specific eval or tx needed,  to f/u any worsening symptoms or concerns

## 2014-09-10 NOTE — Telephone Encounter (Signed)
Patient informed of MD instructions.  She stated if it does not improve she will schedule an OV.

## 2014-09-14 ENCOUNTER — Telehealth: Payer: Self-pay | Admitting: Internal Medicine

## 2014-09-14 MED ORDER — MONTELUKAST SODIUM 10 MG PO TABS: 10.0000 mg | ORAL_TABLET | Freq: Every day | ORAL | Status: DC

## 2014-09-14 NOTE — Telephone Encounter (Signed)
Called pt. Aware we do not have any samples of singulair. RX sent in. Nothing further needed

## 2014-09-16 ENCOUNTER — Encounter (INDEPENDENT_AMBULATORY_CARE_PROVIDER_SITE_OTHER): Payer: 59

## 2014-09-16 ENCOUNTER — Ambulatory Visit (INDEPENDENT_AMBULATORY_CARE_PROVIDER_SITE_OTHER): Payer: 59 | Admitting: Internal Medicine

## 2014-09-16 ENCOUNTER — Encounter: Payer: Self-pay | Admitting: Internal Medicine

## 2014-09-16 VITALS — BP 104/68 | HR 70 | Ht 59.0 in | Wt 166.6 lb

## 2014-09-16 DIAGNOSIS — I6330 Cerebral infarction due to thrombosis of unspecified cerebral artery: Secondary | ICD-10-CM

## 2014-09-16 DIAGNOSIS — R55 Syncope and collapse: Secondary | ICD-10-CM

## 2014-09-16 HISTORY — PX: OTHER SURGICAL HISTORY: SHX169

## 2014-09-16 MED ORDER — AMLODIPINE BESYLATE 5 MG PO TABS: 2.5000 mg | ORAL_TABLET | Freq: Every day | ORAL | Status: DC

## 2014-09-16 NOTE — Progress Notes (Addendum)
SURGEON:  [name], MD     PREPROCEDURE DIAGNOSIS:  Cryptogenic Stroke    POSTPROCEDURE DIAGNOSIS:  Cryptogenic Stroke     PROCEDURES:   1. Implantable loop recorder implantation    INTRODUCTION:  Andrea Mason is a 63 y.o. female with a history of unexplained stroke who presents today for implantable loop implantation.  The patient has had a cryptogenic stroke.  Despite an extensive workup by neurology, no reversible causes have been identified.  she has worn telemetry during which she did not have arrhythmias.  There is significant concern for possible atrial fibrillation as the cause for the patients stroke.  The patient therefore presents today for implantable loop implantation.     DESCRIPTION OF PROCEDURE:  Informed written consent was obtained.  The patient was implanted according to the [facility] ambulatory setting.  The patient required no sedation for the procedure today.  Mapping over the patient's chest was performed to identify the area where electrograms were most prominent for ILR recording.  This area was found to be the left parasternal region over the 3rd-4th intercostal space. The patients left chest was therefore prepped and draped in the usual sterile fashion. The skin overlying the left parasternal region was infiltrated with lidocaine for local analgesia.  A 0.5-cm incision was made over the left parasternal region over the 3rd intercostal space.  A subcutaneous ILR pocket was fashioned using a combination of sharp and blunt dissection.  A Medtronic Reveal [place] model [id number] implantable loop recorder was then placed into the pocket  R waves were very prominent and measured 1.76mV. EBL<1 ml.  Steri- Strips and a sterile dressing were then applied.  There were no early apparent complications.     CONCLUSIONS:   1. Successful implantation of a Medtronic Reveal LINQ implantable loop recorder for cryptogenic stroke  2. No early apparent complications.

## 2014-09-16 NOTE — Patient Instructions (Addendum)
Your physician recommends that you schedule a follow-up appointment in: 10 days in the device clinic and 1 month with Dr Rayann Heman and research  Your physician has recommended you make the following change in your medication:  1) Decrease Amlodipine to 2.5mg  daily (1/2 of a 5mg  tablet)

## 2014-09-21 ENCOUNTER — Telehealth: Payer: Self-pay | Admitting: Internal Medicine

## 2014-09-21 NOTE — Telephone Encounter (Signed)
ATC fast busy signal WCB 

## 2014-09-22 ENCOUNTER — Ambulatory Visit (INDEPENDENT_AMBULATORY_CARE_PROVIDER_SITE_OTHER): Payer: 59 | Admitting: *Deleted

## 2014-09-22 ENCOUNTER — Telehealth: Payer: Self-pay | Admitting: Internal Medicine

## 2014-09-22 DIAGNOSIS — I639 Cerebral infarction, unspecified: Secondary | ICD-10-CM

## 2014-09-22 DIAGNOSIS — I6340 Cerebral infarction due to embolism of unspecified cerebral artery: Secondary | ICD-10-CM

## 2014-09-22 LAB — MDC_IDC_ENUM_SESS_TYPE_INCLINIC
Date Time Interrogation Session: 20151020144636
MDC IDC SET ZONE DETECTION INTERVAL: 3000 ms
Zone Setting Detection Interval: 2000 ms
Zone Setting Detection Interval: 360 ms

## 2014-09-22 NOTE — Telephone Encounter (Signed)
Pt is returning call & can be reached at [phone].  Andrea Mason

## 2014-09-22 NOTE — Telephone Encounter (Signed)
LMTCB

## 2014-09-22 NOTE — Telephone Encounter (Signed)
Called spoke with pt. Aware we do not have any samples of dulera. She asked for symbicort or QVAR and we do not have any of those either.  Pt reports she is not able to afford a prescription for these inhalers. She is requesting a sample of some type of inhaler. Please advise thanks  Allergies  Allergen Reactions  . Tramadol Other (See Comments)    Almost passed out  . Ivp Dye [Iodinated Diagnostic Agents] Nausea And Vomiting and Other (See Comments)    Reaction: hot flashes  . Promethazine-Codeine Nausea And Vomiting

## 2014-09-22 NOTE — Telephone Encounter (Signed)
Pt returned call & can be reached at [phone].  Andrea Mason

## 2014-09-22 NOTE — Telephone Encounter (Signed)
There are no generics We do have samples for pts who we see in the office so best to make appt to see [name] to regroup for longterm rx  Options

## 2014-09-22 NOTE — Telephone Encounter (Signed)
Message closed in error copied below:     Call Documentation     Andrea Mason at 09/22/2014 3:26 PM     Status: Signed        Pt returned call & can be reached at [phone]. Holly D [place], [place] at 09/22/2014 1:26 PM     Status: Signed        Bubba Camp        Tanda Rockers, MD at 09/22/2014 12:40 PM     Status: Signed        There are no generics  We do have samples for pts who we see in the office so best to make appt to see [name] to regroup for longterm rx Options         Inge Rise, CMA at 09/22/2014 11:16 AM     Status: Signed        Called spoke with pt. Aware we do not have any samples of dulera. She asked for symbicort or QVAR and we do not have any of those either.  Pt reports she is not able to afford a prescription for these inhalers. She is requesting a sample of some type of inhaler. Please advise thanks      Allergies      Allergen  Reactions        Tramadol  Other (See Comments)        Almost passed out        Ivp Dye [Iodinated Diagnostic Agents]  Nausea And Vomiting and Other (See Comments)        Reaction: hot flashes        Promethazine-Codeine  Nausea And Vomiting          Andrea Mason at 09/22/2014 11:15 AM     Status: Signed        Pt is returning call & can be reached at [phone]. Holly D [place] at 09/22/2014 10:30 AM     Status: Signed        LMTCB x 2        Inge Rise, CMA at 09/21/2014 5:25 PM     Status: Signed        ATC fast busy signal WCB                 Encounter MyChart Messages     No messages in this encounter             Routing History     Priority Sent On From To Message Type     09/22/2014 12:41 PM Tanda Rockers, MD [facility] Triage Pool Patient Calls     09/22/2014 11:17 AM Mindy Elza Rafter, CMA Tanda Rockers, MD Patient Calls     09/22/2014 11:15 AM Andrea Mason [facility] Triage Pool Patient Calls     09/21/2014 4:42 PM Hillery Hunter [facility] Triage Pool  Patient Calls           Created by     Hillery Hunter on 09/21/2014 04:39 PM                               Visit Pharmacy     [address] - [place], [place] - [place]  Contacts       Type Contact Phone    09/21/2014 4:39 PM Phone (Incoming) Leilanny, Fluitt (Self) [phone] (H)    pt calling to see if we have samples symbicort or qvar please advise

## 2014-09-22 NOTE — Progress Notes (Signed)
Wound check in clinic s/p ILR implant. Wound well healed without redness or edema. Pt with 0 tachy episodes; 0 brady episodes; 0 asystole; 0 AF episodes.  Plan to follow up via [facility] and with JA PRN.

## 2014-09-22 NOTE — Telephone Encounter (Signed)
LMTCB x2  

## 2014-09-23 MED ORDER — MOMETASONE FURO-FORMOTEROL FUM 100-5 MCG/ACT IN AERO: 2.0000 | INHALATION_SPRAY | Freq: Two times a day (BID) | RESPIRATORY_TRACT | Status: DC

## 2014-09-23 NOTE — Telephone Encounter (Signed)
Called and spoke to pt. Appt made for 09/25/14 with TP per MW.  Ruthe Mannan 100 left up front for pick up.  Pt aware and verbalized understanding.  Nothing further needed.

## 2014-09-24 ENCOUNTER — Ambulatory Visit: Payer: 59

## 2014-09-25 ENCOUNTER — Encounter: Payer: Self-pay | Admitting: Adult Health

## 2014-09-25 ENCOUNTER — Ambulatory Visit (INDEPENDENT_AMBULATORY_CARE_PROVIDER_SITE_OTHER): Payer: 59 | Admitting: Adult Health

## 2014-09-25 VITALS — BP 110/80 | HR 74 | Temp 97.0°F | Ht 60.0 in | Wt 167.2 lb

## 2014-09-25 DIAGNOSIS — J45909 Unspecified asthma, uncomplicated: Secondary | ICD-10-CM | POA: Diagnosis not present

## 2014-09-25 NOTE — Patient Instructions (Signed)
Continue on Dulera 2 puffs Twice daily  ,rinse after use.  follow up Dr. Melvyn Novas  In 3 months and As needed

## 2014-09-25 NOTE — Progress Notes (Signed)
Subjective:    Patient ID: Andrea Mason, female   DOB: [birth year]   MRN: [id number]     Brief patient profile:  25 yobf never smoker followed by Dr Annamaria Boots for allergic rhinitis and asthma   History of Present Illness  02/05/2013 1st pulmonary eval in EPIC era baseline = completely better on qvar 80 2bid and only use rescue once a week at baseline then much worse starting 3/3 with cough/ congestion/ green mucus and rescue x one by time of ov at 2pm.  No resting sob, very hoarse with harsh barking cough >Augmentin rx       04/18/2013  ov/Wert acute w/in no calendar Chief Complaint  Patient presents with  . Acute Visit    Pt c/o wheezing for the past several wks, worse today after being exposed to fragrences. She also c/o cough since last night, non prod.    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 rec Prednisone 10 mg take  4 each am x 2 days,   2 each am x 2 days,  1 each am x2days and stop Only use your albuterol as a rescue    06/03/2013 f/u ov/Wert f/u asthma/ chronic rhinitis Chief Complaint  Patient presents with  . Follow-up    6 week follow up Complains of green mucous   abrupt cough onset x one week, not needing proaire, assoc with nasal congestion but minimal sob. Rec: augmentin/ pred  06/30/13- 53 yobf never smoker followed byDr Melvyn Novas for asthma/ Last saw Dr Annamaria Boots 2012-for allergic rhinitis and asthma  FOLLOW UP:  pt reports she has had a few episodes with sinus infections/URI requing rounds of abx-- would like to discuss restarting allergy vaccine per Dr. Melvyn Novas patient needs allegies more under control She was on allergy vaccine for one or 2 years, stopping 2 years ago-drifted off. Complains of frequent nasal congestion, green nasal discharge treated with prednisone and amoxicillin. Right eye waters. Some sneeze- perennial but worse in spring and fall pollen seasons. Using Zyrtec and Flonase. Denies headaches, ear pressure or  itching.  History of sinus surgery twice by Dr. [name]. CT head 2012-chronic sinus disease    08/21/13-62 yobf never smoker followed by Dr Melvyn Novas for asthma/  Dr Annamaria Boots [phone] allergic rhinitis and asthma  FOLLOWS FOR: Not on Allergy vaccine ; has not got Zpak that was called into pharmacy recently We reviewed skin testing from 2011. She had tried to start allergy vaccine but could not afford it and says that she cannot afford it now. Perennial rhinitis and asthma. Can't afford to fill recent prescription for Zithromax. When she takes Augmentin she says it loosens ropy mucus from her nose. Continues daily Zyrtec, Alvesco and uses rescue inhaler 2 or 3 days per week.  Has seen Dr Ayesha Mohair ENT/ [place] and Dr [name] ENT..  09/23/13-  12 yobf never smoker followed by Dr Melvyn Novas for asthma/  Dr Annamaria Boots for allergic rhinitis FOLLOWS FOR:has stopped vaccine; still working with insurance about  allergy coverage. Thinks she has a sinus infection. Sneezing and coughing with green from nose. Ears feel full and itch. She did not followup with Dr. Lovenia Shuck rec augmentin > did not take alvesco > ran out of samples and did not fill rx   10/24/2013 f/u ov/Wert re: cough > rhinitis > sob Chief Complaint  Patient presents with  . Acute Visit    Pt c/o sore throat, non prod cough  and PND x 1 wk.   also very hoarse. Green discharge from nose in am's esp. No perceived increased need for saba. 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  2. Subcutaneous Lovenox for deep venous thrombosis prophylaxis.  3. Dysphagia.  4. Hypertension.  5. Acute sinusitis, resolved.  6. Hyperlipidemia.   7. Gastroesophageal reflux disease.  8. Asthma.   02/26/14 MW/ post hosp ov from [facility] doing well but confused again with meds, did not bring med calendar "they've changed my meds" no need for saba on symbicort 80 2bid  rec Just use your symbicort if you have cough shortness or breath or wheeze  See [name] in 4  Weeks for med calendar    03/24/2014 acute  ov/Wert re: asthma flare / no med calendar yet, confused with names of meds/ not sure she's on symbicort and using lots of saba including 3 h prior to [place] Complaint  Patient presents with  . Acute Visit    Pt c/o wheezing for the past several wks- worse at night and unable to sleep.  She also c/o occ cough- prod occ with minimal green sputum.    >>augmentin and steroid taper   04/07/2014 Follow up and Med review  Patient returns for a followup and medication review. We reviewed all her medications and organized them into a patient medication calendar with patient education. Appears, that she's taking her medications correctly. Patient was seen last with an asthma flare. Last visit, with a URI/sinusitis . She was treated with Augmentin and steroid taper with improvement in symptoms.   05/14/14 Follow up  Pt c/o increased cough with green mucus, bad taste in mouth, ear itching x 1 week. Pt using Mucinex 1200mg  qd. No fever. pt is very confused with medications--not sure if she is taking the right inhalers--not sure if she has Dulera/Symbicort We reviewed her meds and she is suppose to be on [place]. She denies any hemoptysis, orthopnea, PND, or leg swelling rec No change in recs  06/24/2014 f/u ov/Wert re: chronic cough worse at hs and early in am/ productive  Chief Complaint  Patient presents with  . Acute Visit    Pt c/o sinus congestion and green nasal d/c not improving since her last visit.    breathing ok/ no need for saba/ on dulera 100 2bid Has appt to see [name]  Already tried zpak from primary care, min  improvement then worse again  >>augmentin x 10 d  09/25/2014 Follow up (Asthma, chronic cough and AR ) Pt returns for follow up . Says overall she is doing okay.  Cough and wheezing are less. She is applying for MAP program for her medicine as she has lost her insurance . She is applying for disability. She is applying for [facility]  Through the MAP program.  Previously on Symbicort but can not get this through the program.  Denies chest pain, orthopnea,  edema or fever.     Current Medications, Allergies, Complete Past Medical History, Past Surgical History, Family History, and Social History were reviewed in [employer]  electronic medical record.  ROS  The following are not active complaints unless bolded sore throat, dysphagia, dental problems, itching, sneezing,   , ear ache,   fever, chills, sweats, unintended wt loss, pleuritic or exertional cp, hemoptysis,  orthopnea pnd or leg swelling, presyncope, palpitations, heartburn, abdominal pain, anorexia, nausea, vomiting, diarrhea  or change in bowel or urinary habits, change in stools or urine, dysuria,hematuria,  rash, arthralgias, visual complaints, headache, numbness weakness or ataxia or problems with walking or coordination,  change in mood/affect or memory.           OBJ- Physical Exam   amb wf    General- Alert, Oriented, Affect-appropriate,    \HEENT: nl dentition, turbinates, and orophanx. Nl external ear canals without cough reflex, no thrush noted    NECK :  without JVD/Nodes/TM/ nl carotid upstrokes bilaterally   LUNGS: no acc muscle use, CTA w/ no wheezing   CV:  RRR  no s3 or murmur or increase in P2, no edema   ABD:  soft and nontender with nl excursion in the supine position. No bruits or organomegaly, bowel sounds nl  MS:  warm without deformities, calf tenderness, cyanosis or clubbing  SKIN: warm and dry without lesions         MRI 05/25/14 Pansinusitis                    Assessment:

## 2014-09-25 NOTE — Assessment & Plan Note (Signed)
Compensated on present regimen   Plan  Continue on Dulera 2 puffs Twice daily  ,rinse after use.  Follow up Dr. Melvyn Novas  In 3 months and As needed

## 2014-10-08 ENCOUNTER — Encounter: Payer: Self-pay | Admitting: Internal Medicine

## 2014-10-09 ENCOUNTER — Encounter: Payer: Self-pay | Admitting: Internal Medicine

## 2014-10-13 ENCOUNTER — Telehealth: Payer: Self-pay | Admitting: Internal Medicine

## 2014-10-13 ENCOUNTER — Ambulatory Visit (INDEPENDENT_AMBULATORY_CARE_PROVIDER_SITE_OTHER): Payer: 59 | Admitting: *Deleted

## 2014-10-13 DIAGNOSIS — I6340 Cerebral infarction due to embolism of unspecified cerebral artery: Secondary | ICD-10-CM

## 2014-10-13 DIAGNOSIS — I639 Cerebral infarction, unspecified: Secondary | ICD-10-CM

## 2014-10-13 LAB — MDC_IDC_ENUM_SESS_TYPE_REMOTE

## 2014-10-13 MED ORDER — MOMETASONE FURO-FORMOTEROL FUM 100-5 MCG/ACT IN AERO: 2.0000 | INHALATION_SPRAY | Freq: Two times a day (BID) | RESPIRATORY_TRACT | Status: DC

## 2014-10-13 NOTE — Telephone Encounter (Signed)
1 sample of Dulera 100 provided per pt request Pt asking if we ever talked with Crystal at the MAP program  I advised we have not heard anything from her  Pt will call back tomorrow to provide number for [place] to call

## 2014-10-15 NOTE — Telephone Encounter (Signed)
LMTCB

## 2014-10-19 ENCOUNTER — Encounter: Payer: Self-pay | Admitting: Neurology

## 2014-10-19 NOTE — Telephone Encounter (Signed)
Patient calling to give Andrea Mason w/ MAP program.  [phone]

## 2014-10-19 NOTE — Telephone Encounter (Signed)
lmomtcb x 2  

## 2014-10-19 NOTE — Telephone Encounter (Signed)
Spoke with pt - Pt states she has Kendahl Bumgardner's # with MAP program but will need to call back with this #.   Pt states she will leave this # with the receptionist.    Pt c/o increased SOB and increased mucus x 1 month.  Cough is prod with clear to green mucus.  Chest tightness at times.  No fever.  Taking mucinex and using albuterol hfa with relief.  We have scheduled pt to see MW tomorrow at 1:30 pm -- pt confirmed appt and is to seek emergency care if needed.  She verbalized understanding of instructions and is in agreement with plan.  Will await pt's call back with #.

## 2014-10-19 NOTE — Telephone Encounter (Signed)
lmtcb for Crystal.  

## 2014-10-20 ENCOUNTER — Ambulatory Visit (INDEPENDENT_AMBULATORY_CARE_PROVIDER_SITE_OTHER): Payer: 59 | Admitting: Internal Medicine

## 2014-10-20 ENCOUNTER — Encounter: Payer: Self-pay | Admitting: Internal Medicine

## 2014-10-20 VITALS — BP 116/70 | HR 68 | Ht 59.0 in | Wt 164.0 lb

## 2014-10-20 DIAGNOSIS — J324 Chronic pansinusitis: Secondary | ICD-10-CM

## 2014-10-20 DIAGNOSIS — J4520 Mild intermittent asthma, uncomplicated: Secondary | ICD-10-CM

## 2014-10-20 MED ORDER — MONTELUKAST SODIUM 10 MG PO TABS: 10.0000 mg | ORAL_TABLET | Freq: Every day | ORAL | Status: DC

## 2014-10-20 MED ORDER — MOMETASONE FUROATE 50 MCG/ACT NA SUSP: 2.0000 | Freq: Every day | NASAL | Status: DC

## 2014-10-20 MED ORDER — ALBUTEROL SULFATE HFA 108 (90 BASE) MCG/ACT IN AERS: 2.0000 | INHALATION_SPRAY | Freq: Four times a day (QID) | RESPIRATORY_TRACT | Status: DC | PRN

## 2014-10-20 MED ORDER — AMOXICILLIN-POT CLAVULANATE 875-125 MG PO TABS: 1.0000 | ORAL_TABLET | Freq: Two times a day (BID) | ORAL | Status: DC

## 2014-10-20 MED ORDER — MOMETASONE FURO-FORMOTEROL FUM 100-5 MCG/ACT IN AERO: 2.0000 | INHALATION_SPRAY | Freq: Two times a day (BID) | RESPIRATORY_TRACT | Status: DC

## 2014-10-20 NOTE — Patient Instructions (Addendum)
Augmentin 875 mg take one pill twice daily  X 10 days - take at breakfast and supper with large glass of water.  It would help reduce the usual side effects (diarrhea and yeast infections) if you ate cultured yogurt at lunch.   If sinuses not better after this please see Dr [name]  Please schedule a follow up visit in 3 months but call sooner if needed to see [name] with all medes in hand and calendar we reviewed today

## 2014-10-20 NOTE — Telephone Encounter (Signed)
Crystal returning call and said something a/b a recure inhaler.Hillery Hunter

## 2014-10-20 NOTE — Telephone Encounter (Signed)
Crystal calling back.  [phone]

## 2014-10-20 NOTE — Telephone Encounter (Signed)
Rx printed and placed in MW look-at to sign. They need to be faxed to [phone] attention Crystal. Progress [place], CMA

## 2014-10-20 NOTE — Telephone Encounter (Signed)
Can't tell what she's presently taking but try to match it up with what she has that works and if not working needs appt first to sort out what she needs

## 2014-10-20 NOTE — Telephone Encounter (Signed)
LMTCB for Crystal at [phone].

## 2014-10-20 NOTE — Progress Notes (Signed)
Subjective:    Patient ID: Andrea Mason, female   DOB: 1951/08/17   MRN: [id number]     Brief patient profile:  30 yobf never smoker followed by Dr Annamaria Boots for allergic rhinitis and asthma   History of Present Illness  02/05/2013 1st pulmonary eval in [facility] era baseline = completely better on qvar 80 2bid and only use rescue once a week at baseline then much worse starting 3/3 with cough/ congestion/ green mucus and rescue x one by time of ov at 2pm.  No resting sob, very hoarse with harsh barking cough >Augmentin rx       04/18/2013  ov/Assia Meanor acute w/in no calendar Chief Complaint  Patient presents with  . Acute Visit    Pt c/o wheezing for the past several wks, worse today after being exposed to fragrences. She also c/o cough since last night, non prod.    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 rec Prednisone 10 mg take  4 each am x 2 days,   2 each am x 2 days,  1 each am x2days and stop Only use your albuterol as a rescue    06/03/2013 f/u ov/Andrea Mason f/u asthma/ chronic rhinitis Chief Complaint  Patient presents with  . Follow-up    6 week follow up Complains of green mucous   abrupt cough onset x one week, not needing proaire, assoc with nasal congestion but minimal sob. Rec: augmentin/ pred  06/30/13- 51 yobf never smoker followed byDr Melvyn Novas for asthma/ Last saw Dr Annamaria Boots 2012-for allergic rhinitis and asthma  FOLLOW UP:  pt reports she has had a few episodes with sinus infections/URI requing rounds of abx-- would like to discuss restarting allergy vaccine per Dr. Melvyn Novas patient needs allegies more under control She was on allergy vaccine for one or 2 years, stopping 2 years ago-drifted off. Complains of frequent nasal congestion, green nasal discharge treated with prednisone and amoxicillin. Right eye waters. Some sneeze- perennial but worse in spring and fall pollen seasons. Using Zyrtec and Flonase. Denies headaches, ear pressure or  itching.  History of sinus surgery twice by Dr. [name]. CT head 2012-chronic sinus disease    08/21/13-62 yobf never smoker followed by Dr Melvyn Novas for asthma/  Dr Annamaria Boots [phone] allergic rhinitis and asthma  FOLLOWS FOR: Not on Allergy vaccine ; has not got Zpak that was called into pharmacy recently We reviewed skin testing from 2011. She had tried to start allergy vaccine but could not afford it and says that she cannot afford it now. Perennial rhinitis and asthma. Can't afford to fill recent prescription for Zithromax. When she takes Augmentin she says it loosens ropy mucus from her nose. Continues daily Zyrtec, Alvesco and uses rescue inhaler 2 or 3 days per week.  Has seen Dr Ayesha Mohair ENT/ [place] and Dr [name] ENT..  09/23/13-  33 yobf never smoker followed by Dr Melvyn Novas for asthma/  Dr Annamaria Boots for allergic rhinitis FOLLOWS FOR:has stopped vaccine; still working with insurance about  allergy coverage. Thinks she has a sinus infection. Sneezing and coughing with green from nose. Ears feel full and itch. She did not followup with Dr. Lovenia Shuck rec augmentin > did not take alvesco > ran out of samples and did not fill rx   10/24/2013 f/u ov/Andrea Mason re: cough > rhinitis > sob Chief Complaint  Patient presents with  . Acute Visit    Pt c/o sore throat, non prod cough  and PND x 1 wk.   also very hoarse. Green discharge from nose in am's esp. No perceived increased need for saba. 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  2. Subcutaneous Lovenox for deep venous thrombosis prophylaxis.  3. Dysphagia.  4. Hypertension.  5. Acute sinusitis, resolved.  6. Hyperlipidemia.   7. Gastroesophageal reflux disease.  8. Asthma.   02/26/14 Andrea Mason/ post hosp ov from [facility] doing well but confused again with meds, did not bring med calendar "they've changed my meds" no need for saba on symbicort 80 2bid  rec Just use your symbicort if you have cough shortness or breath or wheeze  See Andrea Mason in 4  Weeks for med calendar    03/24/2014 acute  ov/Andrea Mason re: asthma flare / no med calendar yet, confused with names of meds/ not sure she's on symbicort and using lots of saba including 3 h prior to [place] Complaint  Patient presents with  . Acute Visit    Pt c/o wheezing for the past several wks- worse at night and unable to sleep.  She also c/o occ cough- prod occ with minimal green sputum.    >>augmentin and steroid taper   04/07/2014 Follow up and Med review  Patient returns for a followup and medication review. We reviewed all her medications and organized them into a patient medication calendar with patient education. Appears, that she's taking her medications correctly. Patient was seen last with an asthma flare. Last visit, with a URI/sinusitis . She was treated with Augmentin and steroid taper with improvement in symptoms.   05/14/14 Follow up  Pt c/o increased cough with green mucus, bad taste in mouth, ear itching x 1 week. Pt using Mucinex 1200mg  qd. No fever. pt is very confused with medications--not sure if she is taking the right inhalers--not sure if she has Dulera/Symbicort We reviewed her meds and she is suppose to be on [place]. She denies any hemoptysis, orthopnea, PND, or leg swelling rec No change in recs  06/24/2014 f/u ov/Andrea Mason re: chronic cough worse at hs and early in am/ productive  Chief Complaint  Patient presents with  . Acute Visit    Pt c/o sinus congestion and green nasal d/c not improving since her last visit.    breathing ok/ no need for saba/ on dulera 100 2bid Has appt to see [name]  Already tried zpak from primary care, min  improvement then worse again  >>augmentin x 10 d  09/25/2014 Follow up (Asthma, chronic cough and AR ) Pt returns for follow up . Says overall she is doing okay.  Cough and wheezing are less. She is applying for MAP program for her medicine as she has lost her insurance . She is applying for disability. She is applying for [facility]  Through the MAP program.  Previously on Symbicort but can not get this through the program.  Denies chest pain, orthopnea,  edema or fever.  rec Continue on Dulera 2 puffs Twice daily  ,rinse after use.     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 Acute Visit    Pt c/o nasal congestion for the past few months. She also c/o cough- esp when she lies down.      No need for saba   Current Medications, Allergies, Complete Past Medical History, Past Surgical History, Family History, and Social History were reviewed in [employer] record.  ROS  The following are not active complaints unless bolded sore throat, dysphagia, dental problems, itching, sneezing,   , ear ache,   fever, chills, sweats, unintended wt loss, pleuritic or exertional cp, hemoptysis,  orthopnea pnd or leg swelling, presyncope, palpitations, heartburn, abdominal pain, anorexia, nausea, vomiting, diarrhea  or change in bowel or urinary habits, change in stools or urine, dysuria,hematuria,  rash, arthralgias, visual complaints, headache, numbness weakness or ataxia or problems with walking or coordination,  change in mood/affect or memory.           OBJ- Physical Exam   amb wf    General- Alert, Oriented, Affect-appropriate,    Wt Readings from Last 3 Encounters:  10/20/14 164 lb (74.39 kg)  09/25/14 167 lb 3.2 oz (75.841 kg)  09/16/14 166 lb 9.6 oz (75.569 kg)    Vital signs reviewed   HEENT: nl dentition, turbinates, and orophanx. Nl external ear canals without cough reflex, no thrush  noted    NECK :  without JVD/Nodes/TM/ nl carotid upstrokes bilaterally   LUNGS: no acc muscle use, CTA w/ no wheezing   CV:  RRR  no s3 or murmur or increase in P2, no edema   ABD:  soft and nontender with nl excursion in the supine position. No bruits or organomegaly, bowel sounds nl  MS:  warm without deformities, calf tenderness, cyanosis or clubbing  SKIN: warm and dry without lesions         MRI 05/25/14 Pansinusitis                    Assessment:

## 2014-10-20 NOTE — Telephone Encounter (Signed)
rx faxed. [place] Bing, CMA

## 2014-10-20 NOTE — Telephone Encounter (Signed)
Called and spoke to [place] with MAP. Crystal stated the pt will need new printed scripts for [facility] or Symbicort, nasonex instead of fluticasone and generic Singulair. Once these scripts are signed we can fax it to [phone] at Crystal's attention. Pt will either get rx for free or for a few dollars. No form is needed for this pt assistance program. Pt last seen on 09/25/14 by MW.   MW please advise if ok to give scripts for the above medication.

## 2014-10-21 NOTE — Progress Notes (Signed)
Loop recorder 

## 2014-10-21 NOTE — Assessment & Plan Note (Signed)
See MRI 05/25/14   rec Augmentin 875 mg take one pill twice daily  X 10 days  Then ent f/u prn

## 2014-10-21 NOTE — Assessment & Plan Note (Signed)
-  med calendar 03/17/2013 > not using 10/24/2013 , 04/07/2014  > did not bring to clinic 10/20/14  - hfa 75% p coaching 03/25/14    Adequate control on present rx, reviewed > no change in rx needed      Each maintenance medication was reviewed in detail including most importantly the difference between maintenance and as needed and under what circumstances the prns are to be used. This was done in the context of a medication calendar review which provided the patient with a user-friendly unambiguous mechanism for medication administration and reconciliation and provides an action plan for all active problems. It is critical that this be shown to every doctor  for modification during the office visit if necessary so the patient can use it as a working document.

## 2014-10-28 ENCOUNTER — Encounter: Payer: Self-pay | Admitting: Internal Medicine

## 2014-10-28 ENCOUNTER — Ambulatory Visit (INDEPENDENT_AMBULATORY_CARE_PROVIDER_SITE_OTHER): Payer: 59 | Admitting: Internal Medicine

## 2014-10-28 VITALS — BP 132/76 | HR 72 | Ht <= 58 in | Wt 160.0 lb

## 2014-10-28 DIAGNOSIS — I6330 Cerebral infarction due to thrombosis of unspecified cerebral artery: Secondary | ICD-10-CM

## 2014-10-28 DIAGNOSIS — I639 Cerebral infarction, unspecified: Secondary | ICD-10-CM

## 2014-10-28 DIAGNOSIS — I6340 Cerebral infarction due to embolism of unspecified cerebral artery: Secondary | ICD-10-CM

## 2014-10-28 DIAGNOSIS — I10 Essential (primary) hypertension: Secondary | ICD-10-CM

## 2014-10-28 LAB — MDC_IDC_ENUM_SESS_TYPE_INCLINIC
MDC IDC SESS DTM: 20151125113553
MDC IDC SET ZONE DETECTION INTERVAL: 3000 ms
Miscellaneous Comment: 30
Zone Setting Detection Interval: 2000 ms
Zone Setting Detection Interval: 360 ms

## 2014-10-28 NOTE — Patient Instructions (Signed)
Your physician recommends that you schedule a follow-up appointment in: 3 months with research ---Raquel Sarna will call

## 2014-10-28 NOTE — Progress Notes (Signed)
PCP: Cathlean Cower, MD  Andrea Mason is a 63 y.o. female who presents today for routine electrophysiology followup.  Since her recent ILR implant, the patient reports doing very well.  Today, she denies symptoms of palpitations, chest pain, shortness of breath,  lower extremity edema, dizziness, presyncope, or syncope.  The patient is otherwise without complaint today.   Past Medical History  Diagnosis Date  . Hypertension   . GERD (gastroesophageal reflux disease)   . Allergic rhinitis   . Asthma   . Obesity   . Depression   . History of colonic polyps   . DJD (degenerative joint disease), cervical   . 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 Stroke    Past Surgical History  Procedure Laterality Date  . Nasal sinus surgery  07/2006    Dr. [name]  . Nasal polyp surgery  07/2006    x 2 with Dr. [name]  . Foot surgery  1998  . Tubal ligation    . Implantable loop recorder placement  09/16/14    MDT LINQ implanted by Dr Rayann Heman for cryptogenic stroke, RIO II protocol    ROS- all systems are reviewed and negatives except as per HPI above  Current Outpatient Prescriptions  Medication Sig Dispense Refill  . albuterol (PROVENTIL HFA;VENTOLIN HFA) 108 (90 BASE) MCG/ACT inhaler Inhale 2 puffs into the lungs every 6 (six) hours as needed for wheezing or shortness of breath. 1 Inhaler 11  . amLODipine (NORVASC) 5 MG tablet Take 0.5 tablets (2.5 mg total) by mouth daily.    Marland Kitchen amoxicillin-clavulanate (AUGMENTIN) 875-125 MG per tablet Take 1 tablet by mouth 2 (two) times daily. 20 tablet 0  . aspirin 325 MG tablet Take 1 tablet (325 mg total) by mouth daily. 90 tablet 11  . baclofen (LIORESAL) 10 MG tablet Take 10 mg by mouth 3 (three) times daily as needed for muscle spasms.     . Biotin 5000 MCG CAPS Take 1 capsule by mouth daily.    Marland Kitchen dextromethorphan-guaiFENesin (MUCINEX DM) 30-600 MG per 12 hr tablet Take 1 tablet by mouth 2 (two) times daily as needed for cough.     Mariane Baumgarten  Calcium (STOOL SOFTENER PO) Take 1 tablet by mouth daily as needed (constipation).     . fluticasone (FLONASE) 50 MCG/ACT nasal spray Place 2 sprays into both nostrils 2 (two) times daily.    . mometasone (NASONEX) 50 MCG/ACT nasal spray Place 2 sprays into the nose daily. (Patient taking differently: Place 2 sprays into the nose daily as needed (allergies). ) 17 g 11  . mometasone-formoterol (DULERA) 100-5 MCG/ACT AERO Inhale 2 puffs into the lungs 2 (two) times daily. 1 Inhaler 11  . montelukast (SINGULAIR) 10 MG tablet Take 1 tablet (10 mg total) by mouth at bedtime. 30 tablet 11  . omeprazole (PRILOSEC) 20 MG capsule Take 20 mg by mouth every morning.    Marland Kitchen oxybutynin (DITROPAN XL) 5 MG 24 hr tablet Take 1 tablet (5 mg total) by mouth at bedtime. 90 tablet 3  . oxymetazoline (AFRIN) 0.05 % nasal spray Place 2 sprays into both nostrils 2 (two) times daily as needed for congestion.    . polyethylene glycol powder (GLYCOLAX/MIRALAX) powder Take 1 Container by mouth 2 (two) times daily.    . pravastatin (PRAVACHOL) 80 MG tablet Take 80 mg by mouth at bedtime.    Marland Kitchen spironolactone (ALDACTONE) 25 MG tablet Take 25 mg by mouth daily.    Marland Kitchen zinc gluconate 50 MG tablet Take 50  mg by mouth daily as needed (health).      No current facility-administered medications for this visit.    Physical Exam: Filed Vitals:   10/28/14 1110  BP: 132/76  Pulse: 72  Height: 2\' 1"  (0.635 m)  Weight: 160 lb (72.576 kg)    GEN- The patient is well appearing, alert and oriented x 3 today.   Head- normocephalic, atraumatic Eyes-  Sclera clear, conjunctiva pink Ears- hearing intact Oropharynx- clear Lungs- Clear to ausculation bilaterally, normal work of breathing Heart- Regular rate and rhythm, no murmurs, rubs or gallops, PMI not laterally displaced GI- soft, NT, ND, + BS Extremities- no clubbing, cyanosis, or edema ILR site is well healed  ILR is reviewed and revealed no afib  Assessment and Plan:  1.  Cryptogenic stroke Doing well ILR is reviewed and reveals no afib  2. HTN Stable No change required today   I will follow remotely with carelink Return to see me as needed going forward

## 2014-10-30 ENCOUNTER — Telehealth: Payer: Self-pay | Admitting: Pulmonary Disease

## 2014-10-30 MED ORDER — MOMETASONE FURO-FORMOTEROL FUM 100-5 MCG/ACT IN AERO: 2.0000 | INHALATION_SPRAY | Freq: Two times a day (BID) | RESPIRATORY_TRACT | Status: DC

## 2014-10-30 NOTE — Telephone Encounter (Signed)
Called by patient requesting new Rx for [facility].  Chart reviewed.  Looks like [place] Rx sent to [facility] by Dr. Melvyn Novas on 11/17, patient says it was never received. I re-ordered the same prescription as per Wert's last Rx, sent to [facility].

## 2014-11-01 ENCOUNTER — Telehealth: Payer: Self-pay | Admitting: Pulmonary Disease

## 2014-11-01 NOTE — Telephone Encounter (Signed)
Patient calling b/c did not receive dulera rx she requested.  I called it in to [place]

## 2014-11-02 ENCOUNTER — Telehealth: Payer: Self-pay | Admitting: Internal Medicine

## 2014-11-02 NOTE — Telephone Encounter (Signed)
Called and spoke with the pharmacy and they are aware of dulera 100 should be what they fill for the pt.  This has been corrected and nothing further is needed.

## 2014-11-09 ENCOUNTER — Encounter: Payer: Self-pay | Admitting: Internal Medicine

## 2014-11-11 ENCOUNTER — Telehealth: Payer: Self-pay | Admitting: Internal Medicine

## 2014-11-11 MED ORDER — OMEPRAZOLE 20 MG PO CPDR: 20.0000 mg | DELAYED_RELEASE_CAPSULE | ORAL | Status: DC

## 2014-11-11 NOTE — Telephone Encounter (Signed)
Called and spoke with pt and she is aware of refill of the omeprazole that has been sent to her pharmacy.  Nothing further is needed.

## 2014-11-16 ENCOUNTER — Ambulatory Visit (INDEPENDENT_AMBULATORY_CARE_PROVIDER_SITE_OTHER): Payer: 59 | Admitting: *Deleted

## 2014-11-16 ENCOUNTER — Encounter: Payer: Self-pay | Admitting: Internal Medicine

## 2014-11-16 DIAGNOSIS — I6330 Cerebral infarction due to thrombosis of unspecified cerebral artery: Secondary | ICD-10-CM

## 2014-11-18 NOTE — Progress Notes (Signed)
Loop recorder 

## 2014-12-02 ENCOUNTER — Other Ambulatory Visit (INDEPENDENT_AMBULATORY_CARE_PROVIDER_SITE_OTHER): Payer: 59

## 2014-12-02 ENCOUNTER — Ambulatory Visit (INDEPENDENT_AMBULATORY_CARE_PROVIDER_SITE_OTHER): Payer: 59 | Admitting: Internal Medicine

## 2014-12-02 ENCOUNTER — Encounter: Payer: Self-pay | Admitting: Internal Medicine

## 2014-12-02 VITALS — BP 132/82 | HR 93 | Temp 98.1°F | Ht 59.0 in | Wt 161.9 lb

## 2014-12-02 DIAGNOSIS — Z889 Allergy status to unspecified drugs, medicaments and biological substances status: Secondary | ICD-10-CM

## 2014-12-02 DIAGNOSIS — Z0000 Encounter for general adult medical examination without abnormal findings: Secondary | ICD-10-CM

## 2014-12-02 DIAGNOSIS — Z23 Encounter for immunization: Secondary | ICD-10-CM

## 2014-12-02 DIAGNOSIS — J4520 Mild intermittent asthma, uncomplicated: Secondary | ICD-10-CM

## 2014-12-02 DIAGNOSIS — R739 Hyperglycemia, unspecified: Secondary | ICD-10-CM

## 2014-12-02 LAB — HEPATIC FUNCTION PANEL
ALT: 10 U/L (ref 0–35)
AST: 17 U/L (ref 0–37)
Albumin: 4 g/dL (ref 3.5–5.2)
Alkaline Phosphatase: 91 U/L (ref 39–117)
BILIRUBIN TOTAL: 1.1 mg/dL (ref 0.2–1.2)
Bilirubin, Direct: 0.2 mg/dL (ref 0.0–0.3)
TOTAL PROTEIN: 7.6 g/dL (ref 6.0–8.3)

## 2014-12-02 LAB — BASIC METABOLIC PANEL
BUN: 12 mg/dL (ref 6–23)
CALCIUM: 9.4 mg/dL (ref 8.4–10.5)
CO2: 28 mEq/L (ref 19–32)
Chloride: 103 mEq/L (ref 96–112)
Creatinine, Ser: 0.8 mg/dL (ref 0.4–1.2)
GFR: 94.87 mL/min (ref >60.00)
GLUCOSE: 67 mg/dL — AB (ref 70–99)
Potassium: 3.8 mEq/L (ref 3.5–5.1)
SODIUM: 138 meq/L (ref 135–145)

## 2014-12-02 LAB — CBC WITH DIFFERENTIAL/PLATELET
BASOS PCT: 0.7 % (ref 0.0–3.0)
Basophils Absolute: 0 10*3/uL (ref 0.0–0.1)
EOS ABS: 1.5 10*3/uL — AB (ref 0.0–0.7)
Eosinophils Relative: 24.1 % — ABNORMAL HIGH (ref 0.0–5.0)
HCT: 38.2 % (ref 36.0–46.0)
HEMOGLOBIN: 12.7 g/dL (ref 12.0–15.0)
LYMPHS ABS: 2 10*3/uL (ref 0.7–4.0)
LYMPHS PCT: 32.2 % (ref 12.0–46.0)
MCHC: 33.4 g/dL (ref 30.0–36.0)
MCV: 90.5 fl (ref 78.0–100.0)
Monocytes Absolute: 0.4 10*3/uL (ref 0.1–1.0)
Monocytes Relative: 7 % (ref 3.0–12.0)
NEUTROS ABS: 2.3 10*3/uL (ref 1.4–7.7)
Neutrophils Relative %: 36 % — ABNORMAL LOW (ref 43.0–77.0)
Platelets: 197 10*3/uL (ref 150.0–400.0)
RBC: 4.22 Mil/uL (ref 3.87–5.11)
RDW: 13.2 % (ref 11.5–15.5)
WBC: 6.3 10*3/uL (ref 4.0–10.5)

## 2014-12-02 LAB — LIPID PANEL
Cholesterol: 105 mg/dL (ref 0–200)
HDL: 53.1 mg/dL (ref >39.00)
LDL CALC: 44 mg/dL (ref 0–99)
NONHDL: 51.9
Total CHOL/HDL Ratio: 2
Triglycerides: 40 mg/dL (ref 0.0–149.0)
VLDL: 8 mg/dL (ref 0.0–40.0)

## 2014-12-02 LAB — TSH: TSH: 1.47 u[IU]/mL (ref 0.35–4.50)

## 2014-12-02 LAB — HEMOGLOBIN A1C: HEMOGLOBIN A1C: 5.9 % (ref 4.6–6.5)

## 2014-12-02 MED ORDER — METHYLPREDNISOLONE ACETATE 80 MG/ML IJ SUSP
80.0000 mg | Freq: Once | INTRAMUSCULAR | Status: AC
  Administered 2014-12-02: 80 mg via INTRAMUSCULAR

## 2014-12-02 MED ORDER — MOMETASONE FURO-FORMOTEROL FUM 200-5 MCG/ACT IN AERO: 2.0000 | INHALATION_SPRAY | Freq: Two times a day (BID) | RESPIRATORY_TRACT | Status: DC

## 2014-12-02 NOTE — Patient Instructions (Signed)
You had the steroid shot today, and the Prevnar pneumonia shot  Please take all new medication as prescribed - the Dulera 200  OK to hold on taking the dulera 100 for now; this may be changed back at your next visit with Dr Melvyn Novas  Please continue all other medications as before, and refills have been done if requested.  Please have the pharmacy call with any other refills you may need.  Please continue your efforts at being more active, low cholesterol diet, and weight control.  You are otherwise up to date with prevention measures today.  Please keep your appointments with your specialists as you may have planned  Please go to the LAB in the Basement (turn left off the elevator) for the tests to be done today  You will be contacted by phone if any changes need to be made immediately.  Otherwise, you will receive a letter about your results with an explanation, but please check with MyChart first.  Please remember to sign up for MyChart if you have not done so, as this will be important to you in the future with finding out test results, communicating by private email, and scheduling acute appointments online when needed.  Please return in 6 months, or sooner if needed, with Lab testing done 3-5 days before

## 2014-12-02 NOTE — Progress Notes (Signed)
Pre visit review using our clinic review tool, if applicable. No additional management support is needed unless otherwise documented below in the visit note. 

## 2014-12-02 NOTE — Progress Notes (Signed)
Subjective:    Patient ID: Andrea Mason, female    DOB: 01-Nov-1951, 63 y.o.   MRN: [id number]  HPI  Here for wellness and f/u;  Overall doing ok;  Pt denies CP, worsening SOB, DOE,  orthopnea, PND, worsening LE edema, palpitations, dizziness or syncope, but has been worsening wheezing/sob requiring increased albuterol use..  Pt denies neurological change such as new headache, facial or extremity weakness.  Pt denies polydipsia, polyuria, or low sugar symptoms. Pt states overall good compliance with treatment and medications, good tolerability, and has been trying to follow lower cholesterol diet.  Pt denies worsening depressive symptoms, suicidal ideation or panic. No fever, night sweats, wt loss, loss of appetite, or other constitutional symptoms.  Pt states good ability with ADL's, has low fall risk, home safety reviewed and adequate, no other significant changes in hearing or vision, and only occasionally active with exercise. Has lost 3 lbs with better diet since last visit. Wt Readings from Last 3 Encounters:  12/02/14 161 lb 14 oz (73.426 kg)  10/28/14 160 lb (72.576 kg)  10/20/14 164 lb (74.39 kg)    Past Medical History  Diagnosis Date  . Hypertension   . GERD (gastroesophageal reflux disease)   . Allergic rhinitis   . Asthma   . Obesity   . Depression   . History of colonic polyps   . DJD (degenerative joint disease), cervical   . 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 Stroke    Past Surgical History  Procedure Laterality Date  . Nasal sinus surgery  07/2006    Dr. [name]  . Nasal polyp surgery  07/2006    x 2 with Dr. [name]  . Foot surgery  1998  . Tubal ligation    . 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 She has never used smokeless tobacco. She reports that she does not drink alcohol or use illicit drugs. family history includes Colon cancer  in her brother; Diabetes in her sister. Allergies  Allergen Reactions  . Tramadol Other (See Comments)    Almost passed out  . Ivp Dye [Iodinated Diagnostic Agents] Nausea And Vomiting and Other (See Comments)    Reaction: hot flashes  . Promethazine-Codeine Nausea And Vomiting   Current Outpatient Prescriptions on File Prior to Visit  Medication Sig Dispense Refill  . albuterol (PROVENTIL HFA;VENTOLIN HFA) 108 (90 BASE) MCG/ACT inhaler Inhale 2 puffs into the lungs every 6 (six) hours as needed for wheezing or shortness of breath. 1 Inhaler 11  . amLODipine (NORVASC) 5 MG tablet Take 0.5 tablets (2.5 mg total) by mouth daily.    Marland Kitchen amoxicillin-clavulanate (AUGMENTIN) 875-125 MG per tablet Take 1 tablet by mouth 2 (two) times daily. 20 tablet 0  . aspirin 325 MG tablet Take 1 tablet (325 mg total) by mouth daily. 90 tablet 11  . baclofen (LIORESAL) 10 MG tablet Take 10 mg by mouth 3 (three) times daily as needed for muscle spasms.     . Biotin 5000 MCG CAPS Take 1 capsule by mouth daily.    Marland Kitchen dextromethorphan-guaiFENesin (MUCINEX DM) 30-600 MG per 12 hr tablet Take 1 tablet by mouth 2 (two) times daily as needed for cough.     Mariane Baumgarten Calcium (STOOL SOFTENER PO) Take 1 tablet by mouth daily as needed (constipation).     . fluticasone (FLONASE) 50 MCG/ACT nasal spray Place  2 sprays into both nostrils 2 (two) times daily.    . mometasone (NASONEX) 50 MCG/ACT nasal spray Place 2 sprays into the nose daily. (Patient taking differently: Place 2 sprays into the nose daily as needed (allergies). ) 17 g 11  . mometasone-formoterol (DULERA) 100-5 MCG/ACT AERO Inhale 2 puffs into the lungs 2 (two) times daily. 1 Inhaler 11  . montelukast (SINGULAIR) 10 MG tablet Take 1 tablet (10 mg total) by mouth at bedtime. 30 tablet 11  . omeprazole (PRILOSEC) 20 MG capsule Take 1 capsule (20 mg total) by mouth every morning. 30 capsule 1  . oxybutynin (DITROPAN XL) 5 MG 24 hr tablet Take 1 tablet (5 mg total) by  mouth at bedtime. 90 tablet 3  . oxymetazoline (AFRIN) 0.05 % nasal spray Place 2 sprays into both nostrils 2 (two) times daily as needed for congestion.    . polyethylene glycol powder (GLYCOLAX/MIRALAX) powder Take 1 Container by mouth 2 (two) times daily.    . pravastatin (PRAVACHOL) 80 MG tablet Take 80 mg by mouth at bedtime.    Marland Kitchen spironolactone (ALDACTONE) 25 MG tablet Take 25 mg by mouth daily.    Marland Kitchen zinc gluconate 50 MG tablet Take 50 mg by mouth daily as needed (health).      No current facility-administered medications on file prior to visit.   Review of Systems Constitutional: Negative for increased diaphoresis, other activity, appetite or other siginficant weight change  HENT: Negative for worsening hearing loss, ear pain, facial swelling, mouth sores and neck stiffness.   Eyes: Negative for other worsening pain, redness or visual disturbance.  Respiratory: Negative for shortness of breath and wheezing.   Cardiovascular: Negative for chest pain and palpitations.  Gastrointestinal: Negative for diarrhea, blood in stool, abdominal distention or other pain Genitourinary: Negative for hematuria, flank pain or change in urine volume.  Musculoskeletal: Negative for myalgias or other joint complaints.  Skin: Negative for color change and wound.  Neurological: Negative for syncope and numbness. other than noted Hematological: Negative for adenopathy. or other swelling Psychiatric/Behavioral: Negative for hallucinations, self-injury, decreased concentration or other worsening agitation.      Objective:   Physical Exam BP 132/82 mmHg  Pulse 93  Temp(Src) 98.1 F (36.7 C) (Oral)  Ht 4\' 11"  (1.499 m)  Wt 161 lb 14 oz (73.426 kg)  BMI 32.68 kg/m2  SpO2 99% VS noted,  Constitutional: Pt is oriented to person, place, and time. Appears well-developed and well-nourished.  Head: Normocephalic and atraumatic.  Right Ear: External ear normal.  Left Ear: External ear normal.  Nose: Nose  normal.  Mouth/Throat: Oropharynx is clear and moist.  Eyes: Conjunctivae and EOM are normal. Pupils are equal, round, and reactive to light.  Neck: Normal range of motion. Neck supple. No JVD present. No tracheal deviation present.  Cardiovascular: Normal rate, regular rhythm, normal heart sounds and intact distal pulses.   Pulmonary/Chest: Effort normal and breath sounds without rales or wheezing but decreased BS bilat  Abdominal: Soft. Bowel sounds are normal. NT. No HSM  Musculoskeletal: Normal range of motion. Exhibits no edema.  Lymphadenopathy:  Has no cervical adenopathy.  Neurological: Pt is alert and oriented to person, place, and time. Pt has normal reflexes. No cranial nerve deficit. Motor grossly intact Skin: Skin is warm and dry. No rash noted.  Psychiatric:  Has normal mood and affect. Behavior is normal.     Assessment & Plan:

## 2014-12-04 ENCOUNTER — Other Ambulatory Visit: Payer: Self-pay | Admitting: Internal Medicine

## 2014-12-05 ENCOUNTER — Other Ambulatory Visit: Payer: Self-pay | Admitting: Internal Medicine

## 2014-12-07 ENCOUNTER — Telehealth: Payer: Self-pay | Admitting: Internal Medicine

## 2014-12-07 NOTE — Telephone Encounter (Signed)
Spoke with the pt She scheduled appt with MW for multiple co's- including cough and SOB "allergies" on 12/11/13  She is asking "is this okay"? Since MW advised her to see ENT for future problems with her sinuses  I advised that she should keep her appt since having problems with her breathing  She verbalized understanding  Nothing further needed

## 2014-12-08 ENCOUNTER — Encounter: Payer: Self-pay | Admitting: Family

## 2014-12-08 ENCOUNTER — Ambulatory Visit (INDEPENDENT_AMBULATORY_CARE_PROVIDER_SITE_OTHER)
Admission: RE | Admit: 2014-12-08 | Discharge: 2014-12-08 | Disposition: A | Discharge status: Home / Self Care | Payer: 59 | Source: Ambulatory Visit | Attending: Family | Admitting: Family

## 2014-12-08 ENCOUNTER — Ambulatory Visit: Payer: Self-pay | Admitting: Internal Medicine

## 2014-12-08 ENCOUNTER — Ambulatory Visit (INDEPENDENT_AMBULATORY_CARE_PROVIDER_SITE_OTHER): Payer: 59 | Admitting: Family

## 2014-12-08 ENCOUNTER — Ambulatory Visit: Payer: 59 | Admitting: Neurology

## 2014-12-08 VITALS — BP 140/100 | HR 65 | Temp 98.1°F | Resp 18 | Wt 154.4 lb

## 2014-12-08 DIAGNOSIS — R062 Wheezing: Secondary | ICD-10-CM

## 2014-12-08 DIAGNOSIS — R059 Cough, unspecified: Secondary | ICD-10-CM | POA: Insufficient documentation

## 2014-12-08 DIAGNOSIS — R05 Cough: Secondary | ICD-10-CM

## 2014-12-08 MED ORDER — LEVOFLOXACIN 500 MG PO TABS: 500.0000 mg | ORAL_TABLET | Freq: Every day | ORAL | Status: DC

## 2014-12-08 MED ORDER — ALBUTEROL SULFATE (2.5 MG/3ML) 0.083% IN NEBU
5.0000 mg | INHALATION_SOLUTION | Freq: Once | RESPIRATORY_TRACT | Status: AC
  Administered 2014-12-08: 5 mg via RESPIRATORY_TRACT

## 2014-12-08 MED ORDER — PREDNISONE 20 MG PO TABS: 20.0000 mg | ORAL_TABLET | Freq: Two times a day (BID) | ORAL | Status: DC

## 2014-12-08 NOTE — Assessment & Plan Note (Addendum)
Symptoms and exam consistent with asthmatic bronchitis. Patient's original oxygen saturation was 84% on room air following breathing treatments it was increased to 92%. Start prednisone 20 mg twice a day for 7 days. Start Levaquin to cover for pneumonia. Obtain chest x-ray to rule out pneumonia. Continue over-the-counter medications as needed for symptom relief. Patient has follow-up appointment with pulmonology on 1/8. Follow up if symptoms worsen or fail to improve.

## 2014-12-08 NOTE — Assessment & Plan Note (Signed)

## 2014-12-08 NOTE — Progress Notes (Signed)
Subjective:    Patient ID: Andrea Mason, female    DOB: September 10, 1951, 64 y.o.   MRN: [id number]  Chief Complaint  Patient presents with  . Follow-up    wheezing, productive cough, SOB x6 weeks    HPI:  Andrea Mason is a 64 y.o. female who presents today for an acute visit.  Acute symptoms of wheezing, productive cough and shortness of breath started about 6 weeks ago. She was recently seen in the office for a physical and her dulera was increased as her asthma is worsening. Has used her prescription medications which have only provided symptom relief.   Allergies  Allergen Reactions  . Tramadol Other (See Comments)    Almost passed out  . Ivp Dye [Iodinated Diagnostic Agents] Nausea And Vomiting and Other (See Comments)    Reaction: hot flashes  . Promethazine-Codeine Nausea And Vomiting    Current Outpatient Prescriptions on File Prior to Visit  Medication Sig Dispense Refill  . albuterol (PROVENTIL HFA;VENTOLIN HFA) 108 (90 BASE) MCG/ACT inhaler Inhale 2 puffs into the lungs every 6 (six) hours as needed for wheezing or shortness of breath. 1 Inhaler 11  . amLODipine (NORVASC) 5 MG tablet Take 0.5 tablets (2.5 mg total) by mouth daily.    Andrea Mason amoxicillin-clavulanate (AUGMENTIN) 875-125 MG per tablet Take 1 tablet by mouth 2 (two) times daily. 20 tablet 0  . aspirin 325 MG tablet Take 1 tablet (325 mg total) by mouth daily. 90 tablet 11  . baclofen (LIORESAL) 10 MG tablet Take 10 mg by mouth 3 (three) times daily as needed for muscle spasms.     . Biotin 5000 MCG CAPS Take 1 capsule by mouth daily.    Andrea Mason dextromethorphan-guaiFENesin (MUCINEX DM) 30-600 MG per 12 hr tablet Take 1 tablet by mouth 2 (two) times daily as needed for cough.     Andrea Mason Calcium (STOOL SOFTENER PO) Take 1 tablet by mouth daily as needed (constipation).     . fluticasone (FLONASE) 50 MCG/ACT nasal spray Place 2 sprays into both nostrils 2 (two) times daily.    . mometasone (NASONEX) 50 MCG/ACT nasal  spray Place 2 sprays into the nose daily. (Patient taking differently: Place 2 sprays into the nose daily as needed (allergies). ) 17 g 11  . mometasone-formoterol (DULERA) 200-5 MCG/ACT AERO Inhale 2 puffs into the lungs 2 (two) times daily. 13 g 2  . montelukast (SINGULAIR) 10 MG tablet Take 1 tablet (10 mg total) by mouth at bedtime. 30 tablet 11  . omeprazole (PRILOSEC) 20 MG capsule TAKE 1 CAPSULE (20 MG TOTAL) BY MOUTH EVERY MORNING. 30 capsule 0  . oxybutynin (DITROPAN XL) 5 MG 24 hr tablet Take 1 tablet (5 mg total) by mouth at bedtime. 90 tablet 3  . oxybutynin (DITROPAN) 5 MG tablet TAKE ONE TABLET BY MOUTH DAILY AT BEDTIME 30 tablet 11  . oxymetazoline (AFRIN) 0.05 % nasal spray Place 2 sprays into both nostrils 2 (two) times daily as needed for congestion.    . polyethylene glycol powder (GLYCOLAX/MIRALAX) powder Take 1 Container by mouth 2 (two) times daily.    . pravastatin (PRAVACHOL) 80 MG tablet Take 80 mg by mouth at bedtime.    Andrea Mason spironolactone (ALDACTONE) 25 MG tablet Take 25 mg by mouth daily.    Andrea Mason zinc gluconate 50 MG tablet Take 50 mg by mouth daily as needed (health).      No current facility-administered medications on file prior to visit.    Review of  Systems  Constitutional: Positive for fever and fatigue.  HENT: Positive for congestion.   Respiratory: Positive for wheezing.       Objective:    BP 140/100 mmHg  Pulse 65  Temp(Src) 98.1 F (36.7 C) (Oral)  Resp 18  Wt 154 lb 6.4 oz (70.035 kg)  SpO2 83% Nursing note and vital signs reviewed.  Retake O2 92% following breathing treatment.  Physical Exam  Constitutional: She is oriented to person, place, and time. She appears well-developed and well-nourished. No distress.  HENT:  Right Ear: Hearing, tympanic membrane, external ear and ear canal normal.  Left Ear: Hearing, tympanic membrane, external ear and ear canal normal.  Nose: Nose normal. Right sinus exhibits no maxillary sinus tenderness and no  frontal sinus tenderness. Left sinus exhibits no maxillary sinus tenderness and no frontal sinus tenderness.  Mouth/Throat: Uvula is midline, oropharynx is clear and moist and mucous membranes are normal.  Cardiovascular: Normal rate, regular rhythm, normal heart sounds and intact distal pulses.   Pulmonary/Chest: Effort normal. She has wheezes. She has rales (Questionable).  Neurological: She is alert and oriented to person, place, and time.  Skin: Skin is warm and dry.  Psychiatric: She has a normal mood and affect. Her behavior is normal. Judgment and thought content normal.       Assessment & Plan:

## 2014-12-08 NOTE — Assessment & Plan Note (Signed)
Mild subjective worsenig symptoms for past several wks , Ok for change dulera 100 to 200, hopefully temporary, f/u with pulm as planned

## 2014-12-08 NOTE — Patient Instructions (Signed)
Thank you for choosing [facility].  Summary/Instructions:  Your prescription(s) have been submitted to your pharmacy or been printed and provided for you. Please take as directed and contact our office if you believe you are having problem(s) with the medication(s) or have any questions.  Please stop by radiology on the basement level of the building for your x-rays. Your results will be released to MyChart (or called to you) after review, usually within 72 hours after test completion. If any treatments or changes are necessary, you will be notified at that same time.  If your symptoms worsen or fail to improve, please contact our office for further instruction, or in case of emergency go directly to the emergency room at the closest medical facility.   General Recommendations:    Please drink plenty of fluids.  Get plenty of rest   Sleep in humidified air  Use saline nasal sprays  Netti pot   OTC Medications:  Decongestants - helps relieve congestion   Flonase (generic fluticasone) or Nasacort (generic triamcinolone) - please make sure to use the "cross-over" technique at a 45 degree angle towards the opposite eye as opposed to straight up the nasal passageway.   Sudafed (generic pseudoephedrine - Note this is the one that is available behind the pharmacy counter); Products with phenylephrine (-PE) may also be used but is often not as effective as pseudoephedrine.   If you have HIGH BLOOD PRESSURE - Coricidin HBP; AVOID any product that is -D as this contains pseudoephedrine which may increase your blood pressure.  Afrin (oxymetazoline) every 6-8 hours for up to 3 days.   Allergies - helps relieve runny nose, itchy eyes and sneezing   Claritin (generic loratidine), Allegra (fexofenidine), or Zyrtec (generic cyrterizine) for runny nose. These medications should not cause drowsiness.  Note - Benadryl (generic diphenhydramine) may be used however may cause  drowsiness  Cough -   Delsym or Robitussin (generic dextromethorphan)  Expectorants - helps loosen mucus to ease removal   Mucinex (generic guaifenesin) as directed on the package.  Headaches / General Aches   Tylenol (generic acetaminophen) - DO NOT EXCEED 3 grams (3,000 mg) in a 24 hour time period  Advil/Motrin (generic ibuprofen)   Sore Throat -   Salt water gargle   Chloraseptic (generic benzocaine) spray or lozenges / Sucrets (generic dyclonine)     

## 2014-12-08 NOTE — Assessment & Plan Note (Signed)
Stable, also for a1c,  to f/u any worsening symptoms or concerns

## 2014-12-10 ENCOUNTER — Ambulatory Visit: Payer: Self-pay | Admitting: Nurse Practitioner

## 2014-12-10 LAB — MDC_IDC_ENUM_SESS_TYPE_REMOTE

## 2014-12-11 ENCOUNTER — Ambulatory Visit (INDEPENDENT_AMBULATORY_CARE_PROVIDER_SITE_OTHER): Payer: 59 | Admitting: Internal Medicine

## 2014-12-11 ENCOUNTER — Encounter: Payer: Self-pay | Admitting: Internal Medicine

## 2014-12-11 VITALS — BP 124/92 | HR 67 | Temp 97.7°F | Ht 60.0 in | Wt 155.0 lb

## 2014-12-11 DIAGNOSIS — J4530 Mild persistent asthma, uncomplicated: Secondary | ICD-10-CM

## 2014-12-11 DIAGNOSIS — J324 Chronic pansinusitis: Secondary | ICD-10-CM

## 2014-12-11 DIAGNOSIS — R059 Cough, unspecified: Secondary | ICD-10-CM

## 2014-12-11 DIAGNOSIS — R05 Cough: Secondary | ICD-10-CM

## 2014-12-11 MED ORDER — AMOXICILLIN-POT CLAVULANATE 875-125 MG PO TABS: 1.0000 | ORAL_TABLET | Freq: Two times a day (BID) | ORAL | Status: DC

## 2014-12-11 NOTE — Patient Instructions (Addendum)
nasal steroids( like flonase/fluticasone )  have no immediate benefit in terms of improving symptoms.  To help them reached the target tissue, you should use Afrin two puffs every 12 hours applied one min before using the nasal steroids.  Afrin should be stopped after no more than 5 days.  If the symptoms worsen, Afrin can be restarted after 5 days off of therapy to prevent rebound congestion from overuse of Afrin.  I also emphasized that in no way are nasal steroids a concern in terms of "addiction".   Augmentin 875 mg take one pill twice daily  X 20 days - take at breakfast and supper with large glass of water.  It would help reduce the usual side effects (diarrhea and yeast infections) if you ate cultured yogurt at lunch.   Return to Dr [name]   Keep head of bed elevated as much as possible   Keep appt with [facility]

## 2014-12-11 NOTE — Assessment & Plan Note (Signed)
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 This was done in the context of a medication calendar review which provided the patient with a user-friendly unambiguous mechanism for medication administration and reconciliation and provides an action plan for all active problems. It is critical that this be shown to every doctor  for modification during the office visit if necessary so the patient can use it as a working document.      Immediately p I went through the entire med calendar she returned to ask the same questions answered on the med calendar between the maint vs prns

## 2014-12-11 NOTE — Progress Notes (Signed)
Subjective:    Patient ID: Andrea Mason, female   DOB: 30-Mar-1951   MRN: [id number]     Brief patient profile:  59 yobf never smoker followed by Dr Annamaria Boots for allergic rhinitis and asthma   History of Present Illness  02/05/2013 1st pulmonary eval in EPIC era baseline = completely better on qvar 80 2bid and only use rescue once a week at baseline then much worse starting 3/3 with cough/ congestion/ green mucus and rescue x one by time of ov at 2pm.  No resting sob, very hoarse with harsh barking cough >Augmentin rx    DATE OF ADMISSION: 11/25/2013  DATE OF DISCHARGE: 12/10/2013   DISCHARGE DIAGNOSES:  1. Embolic left middle cerebral artery infarction.  2. Subcutaneous Lovenox for deep venous thrombosis prophylaxis.  3. Dysphagia.  4. Hypertension.  5. Acute sinusitis, resolved.  6. Hyperlipidemia.  7. Gastroesophageal reflux disease.  8. Asthma.   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 Acute Visit    Pt c/o nasal congestion for the past few months. She also c/o cough- esp when she lies down.     No need for saba rec Augmentin 875 mg take one pill twice daily  X 10 days - take at breakfast and supper with large glass of water.  It would help reduce the usual side effects (diarrhea and yeast infections) if you ate cultured yogurt at lunch.  If sinuses not better after this please see Dr [name] Please schedule a follow up visit in 3 months but call sooner if needed to see [name] with all med  in hand and calendar we reviewed today    12/11/2014   Acute ov/Wert re: Jaynie Bream Teryl Lucy not better with saba / no  med calendar  Chief Complaint  Patient presents with  . Acute Visit    Pt c/o increased SOB for a little over a wk now. She c/o nasal congestion. She also c/o "bad wheezing at night"- wakes her up sometimes.   has not been back to Dr [name], says people come in and steal her papers for disability and med calendars so hasn't been able to use either set Afrin really helps but doesn't know how to use it.   No obvious day to day or daytime variabilty or assoc cp or chest tightness  overt    hb symptoms. No unusual exp hx or h/o childhood pna/ asthma or knowledge of premature birth.  Also denies any obvious fluctuation of symptoms with weather or environmental changes or other aggravating or alleviating factors except as outlined above   Current Medications, Allergies, Complete Past Medical History, Past Surgical History, Family History, and Social History were reviewed in [employer] record.  ROS  The following are not active complaints unless bolded sore throat, dysphagia, dental problems, itching, sneezing,  nasal congestion or excess/ purulent secretions, ear ache,   fever, chills, sweats, unintended wt loss, pleuritic or exertional cp, hemoptysis,  orthopnea pnd or leg swelling, presyncope, palpitations, heartburn, abdominal pain, anorexia, nausea, vomiting, diarrhea  or change in  bowel or urinary habits, change in stools or urine, dysuria,hematuria,  rash, arthralgias, visual complaints, headache, numbness weakness or ataxia or problems with walking or coordination,  change in mood/affect or memory.                    OBJ- Physical Exam   amb wf    General- Alert, nad nasal tone to voice/ prominent pseudoasthma   12/11/2014         155 Wt Readings from Last 3 Encounters:  10/20/14 164 lb (74.39 kg)  09/25/14 167 lb 3.2 oz (75.841 kg)  09/16/14 166 lb 9.6 oz (75.569 kg)    Vital signs reviewed   HEENT: nl dentition, turbinates, and orophanx. Nl external ear  canals without cough reflex, no thrush noted    NECK :  without JVD/Nodes/TM/ nl carotid upstrokes bilaterally   LUNGS: no acc muscle use, CTA w/ no wheezing   CV:  RRR  no s3 or murmur or increase in P2, no edema   ABD:  soft and nontender with nl excursion in the supine position. No bruits or organomegaly, bowel sounds nl  MS:  warm without deformities, calf tenderness, cyanosis or clubbing  SKIN: warm and dry without lesions         MRI 05/25/14 Pansinusitis                    Assessment:

## 2014-12-12 ENCOUNTER — Encounter: Payer: Self-pay | Admitting: Internal Medicine

## 2014-12-12 NOTE — Assessment & Plan Note (Signed)
Most c/w  Classic Upper airway cough syndrome, so named because it's frequently impossible to sort out how much is  CR/sinusitis with freq throat clearing (which can be related to primary GERD)   vs  causing  secondary (" extra esophageal")  GERD from wide swings in gastric pressure that occur with throat clearing, often  promoting self use of mint and menthol lozenges that reduce the lower esophageal sphincter tone and exacerbate the problem further in a cyclical fashion.   These are the same pts (now being labeled as having "irritable larynx syndrome" by some cough centers) who not infrequently have a history of having failed to tolerate ace inhibitors,  dry powder inhalers or biphosphonates or report having atypical reflux symptoms that don't respond to standard doses of PPI , and are easily confused as having aecopd or asthma flares by even experienced allergists/ pulmonologists.   Will try to focus on sinus dz for now, now change in rx    Each maintenance medication was reviewed in detail including most importantly the difference between maintenance and as needed and under what circumstances the prns are to be used. This was done in the context of a medication calendar review which provided the patient with a user-friendly unambiguous mechanism for medication administration and reconciliation and provides an action plan for all active problems. It is critical that this be shown to every doctor  for modification during the office visit if necessary so the patient can use it as a working document.

## 2014-12-12 NOTE — Assessment & Plan Note (Signed)
Referred back to Dr [name] > if the feeling is that this is all allergy related ideally needs to be on shots or xolair but would have to work though getting her adequate insurance which is hard to do when people are "stealing her paperwork"

## 2014-12-14 ENCOUNTER — Telehealth: Payer: Self-pay | Admitting: Internal Medicine

## 2014-12-14 ENCOUNTER — Ambulatory Visit (INDEPENDENT_AMBULATORY_CARE_PROVIDER_SITE_OTHER): Payer: 59 | Admitting: *Deleted

## 2014-12-14 DIAGNOSIS — I6330 Cerebral infarction due to thrombosis of unspecified cerebral artery: Secondary | ICD-10-CM

## 2014-12-14 NOTE — Telephone Encounter (Signed)
Pt requesting a call in the am about/to discuss BP.

## 2014-12-14 NOTE — Telephone Encounter (Signed)
I normally do not do phone consults  OK for robin to contact pt, to see if there is a specific questions  O/w please consider OV

## 2014-12-15 MED ORDER — AMLODIPINE BESYLATE 5 MG PO TABS: 5.0000 mg | ORAL_TABLET | Freq: Every day | ORAL | Status: DC

## 2014-12-15 NOTE — Telephone Encounter (Signed)
Ok to incr the amlod to 5 qd  Continue to monitor BP as she does

## 2014-12-15 NOTE — Telephone Encounter (Signed)
Called pt to verify msg below. Pt states her BP has been elevated. She came in 2 weeks saw Greg BP was 140/100. Had appt with Dr. Melvyn Novas on 1/8 BP was 124/92 was told to contact pcp on BP. Pt states she is currently taking 1/2 amlodipine. Requesting md advisement on BP...Johny Chess

## 2014-12-15 NOTE — Telephone Encounter (Signed)
Called and made patient aware. No further questions/concerns at this time. JG//CMA

## 2014-12-18 NOTE — Progress Notes (Signed)
Loop recorder 

## 2014-12-23 ENCOUNTER — Encounter: Payer: Self-pay | Admitting: Internal Medicine

## 2014-12-28 ENCOUNTER — Encounter: Payer: Self-pay | Admitting: Internal Medicine

## 2014-12-29 ENCOUNTER — Ambulatory Visit: Payer: 59 | Admitting: Internal Medicine

## 2015-01-04 ENCOUNTER — Ambulatory Visit (INDEPENDENT_AMBULATORY_CARE_PROVIDER_SITE_OTHER): Payer: 59 | Admitting: Adult Health

## 2015-01-04 ENCOUNTER — Encounter: Payer: Self-pay | Admitting: Adult Health

## 2015-01-04 VITALS — BP 126/80 | HR 72 | Temp 98.0°F | Ht 60.0 in | Wt 149.6 lb

## 2015-01-04 DIAGNOSIS — J328 Other chronic sinusitis: Secondary | ICD-10-CM

## 2015-01-04 DIAGNOSIS — J4530 Mild persistent asthma, uncomplicated: Secondary | ICD-10-CM

## 2015-01-04 NOTE — Patient Instructions (Signed)
Continue on current regimen .  Follow up with ENT as planned .  follow up Dr. Melvyn Novas  In 6-8 weeks  and As needed

## 2015-01-04 NOTE — Progress Notes (Signed)
Subjective:    Patient ID: Andrea Mason, female   DOB: 1951/06/04   MRN: [id number]     Brief patient profile:  72 yobf never smoker followed by Dr Annamaria Boots for allergic rhinitis and asthma   History of Present Illness  02/05/2013 1st pulmonary eval in EPIC era baseline = completely better on qvar 80 2bid and only use rescue once a week at baseline then much worse starting 3/3 with cough/ congestion/ green mucus and rescue x one by time of ov at 2pm.  No resting sob, very hoarse with harsh barking cough >Augmentin rx    DATE OF ADMISSION: 11/25/2013  DATE OF DISCHARGE: 12/10/2013   DISCHARGE DIAGNOSES:  1. Embolic left middle cerebral artery infarction.  2. Subcutaneous Lovenox for deep venous thrombosis prophylaxis.  3. Dysphagia.  4. Hypertension.  5. Acute sinusitis, resolved.  6. Hyperlipidemia.  7. Gastroesophageal reflux disease.  8. Asthma.   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 Acute Visit    Pt c/o nasal congestion for the past few months. She also c/o cough- esp when she lies down.     No need for saba rec Augmentin 875 mg take one pill twice daily  X 10 days - take at breakfast and supper with large glass of water.  It would help reduce the usual side effects (diarrhea and yeast infections) if you ate cultured yogurt at lunch.  If sinuses not better after this please see Dr [name] Please schedule a follow up visit in 3 months but call sooner if needed to see Arriah Wadle NP with all med  in hand and calendar we reviewed today    12/11/2014   Acute ov/Wert re: Jaynie Bream Teryl Lucy not better with saba / no  med calendar  Chief Complaint  Patient presents with  . Acute Visit    Pt c/o increased SOB for a little over a wk now. She c/o nasal congestion. She also c/o "bad wheezing at night"- wakes her up sometimes.   has not been back to Dr [name], says people come in and steal her papers for disability and med calendars so hasn't been able to use either set Afrin really helps but doesn't know how to use it.  >>augmentin x 20 d .   01/04/2015 Follow up : Asthma /Chronic Rhinitis  Patient returns for a 4 week follow-up Last visit. Patient with asthma exacerbation and sinusitis. She was treated with Augmentin 20 days. She's been recommended to follow-up with ENT and has an upcoming appointment. She says since last visit. She is starting to improve. She has decreased nasal congestion. Breathing has returned back to baseline. She denies flare cough or wheezing. She denies any hemoptysis, orthopnea, PND or leg swelling.    Current Medications, Allergies, Complete Past Medical History, Past Surgical History, Family History, and Social History were reviewed in [employer] record.  ROS  The following are not active complaints unless bolded sore throat, dysphagia, dental problems, itching, sneezing,  nasal congestion ear ache,   fever, chills, sweats, unintended wt loss, pleuritic  or exertional cp, hemoptysis,  orthopnea pnd or leg swelling, presyncope, palpitations, heartburn, abdominal pain, anorexia, nausea, vomiting, diarrhea  or change in bowel or urinary habits, change in stools or urine, dysuria,hematuria,  rash, arthralgias, visual complaints, headache, numbness weakness or ataxia or problems with walking or coordination,  change in mood/affect or memory.                    OBJ- Physical Exam   amb wf    General- Alert, nad nasal tone to voice  12/11/2014         155 >149 01/04/2015    Vital signs reviewed   HEENT: nl dentition, turbinates, and orophanx. Nl external  ear canals without cough reflex, no thrush noted    NECK :  without JVD/Nodes/TM/ nl carotid upstrokes bilaterally   LUNGS: no acc muscle use, CTA w/ no wheezing   CV:  RRR  no s3 or murmur or increase in P2, no edema   ABD:  soft and nontender with nl excursion in the supine position. No bruits or organomegaly, bowel sounds nl  MS:  warm without deformities, calf tenderness, cyanosis or clubbing  SKIN: warm and dry without lesions         MRI 05/25/14 Pansinusitis                    Assessment:

## 2015-01-04 NOTE — Addendum Note (Signed)
Addended by: Devona Konig on: 01/04/2015 12:17 PM   Modules accepted: Medications

## 2015-01-04 NOTE — Assessment & Plan Note (Signed)
Recent flare with sinusitis now resolving   Plan  Continue on current regimen .  Follow up with ENT as planned .  follow up Dr. Melvyn Novas  In 6-8 weeks  and As needed

## 2015-01-04 NOTE — Assessment & Plan Note (Signed)
Recurrent exacerbations now improving with abx  Need follow up with ENT   Plan  Continue on current regimen .  Follow up with ENT as planned .  follow up Dr. Melvyn Novas  In 6-8 weeks  and As needed

## 2015-01-06 ENCOUNTER — Other Ambulatory Visit (INDEPENDENT_AMBULATORY_CARE_PROVIDER_SITE_OTHER): Payer: 59

## 2015-01-06 DIAGNOSIS — Z0000 Encounter for general adult medical examination without abnormal findings: Secondary | ICD-10-CM

## 2015-01-06 LAB — URINALYSIS, ROUTINE W REFLEX MICROSCOPIC
Bilirubin Urine: NEGATIVE
HGB URINE DIPSTICK: NEGATIVE
Ketones, ur: NEGATIVE
Leukocytes, UA: NEGATIVE
Nitrite: NEGATIVE
RBC / HPF: NONE SEEN (ref 0–?)
Specific Gravity, Urine: 1.01 (ref 1.000–1.030)
TOTAL PROTEIN, URINE-UPE24: NEGATIVE
UROBILINOGEN UA: 1 (ref 0.0–1.0)
Urine Glucose: NEGATIVE
pH: 7.5 (ref 5.0–8.0)

## 2015-01-12 ENCOUNTER — Telehealth: Payer: Self-pay | Admitting: Internal Medicine

## 2015-01-12 LAB — MDC_IDC_ENUM_SESS_TYPE_REMOTE

## 2015-01-12 MED ORDER — FLUCONAZOLE 150 MG PO TABS: ORAL_TABLET | ORAL | Status: DC

## 2015-01-12 NOTE — Telephone Encounter (Signed)
States she changed her soap last week.  She states that it irritated her and she believed it made her skin come off.  She has since switched soaps back to what she normally uses.

## 2015-01-12 NOTE — Telephone Encounter (Signed)
Pt called in rquesting a call back from nurse.  Would not tell me what she was needing

## 2015-01-12 NOTE — Telephone Encounter (Signed)
Pt aware rx was sent in to [facility]

## 2015-01-12 NOTE — Telephone Encounter (Signed)
Pt states she has a yeast infection from levaquin that was prescribed by Georgina Snell and she stopped Levaquin and started amoxicillin prescribed by Dr Melvyn Novas.  Wants a rx for diflucan.  [facility] [address]

## 2015-01-14 ENCOUNTER — Ambulatory Visit (INDEPENDENT_AMBULATORY_CARE_PROVIDER_SITE_OTHER): Payer: 59 | Admitting: *Deleted

## 2015-01-14 DIAGNOSIS — I6330 Cerebral infarction due to thrombosis of unspecified cerebral artery: Secondary | ICD-10-CM

## 2015-01-18 NOTE — Progress Notes (Signed)
Loop recorder 

## 2015-01-19 ENCOUNTER — Telehealth: Payer: Self-pay | Admitting: Cardiology

## 2015-01-19 NOTE — Telephone Encounter (Signed)
Spoke w/ pt and informed her to send manual transmission b/c we have not received on since 01-07-15. Pt verbalized understanding.

## 2015-01-22 ENCOUNTER — Ambulatory Visit: Payer: 59 | Admitting: Neurology

## 2015-01-25 ENCOUNTER — Encounter: Payer: Self-pay | Admitting: Internal Medicine

## 2015-01-26 LAB — MDC_IDC_ENUM_SESS_TYPE_REMOTE

## 2015-01-29 ENCOUNTER — Encounter: Payer: Self-pay | Admitting: *Deleted

## 2015-02-03 ENCOUNTER — Other Ambulatory Visit: Payer: Self-pay | Admitting: *Deleted

## 2015-02-03 MED ORDER — ESOMEPRAZOLE MAGNESIUM 40 MG PO CPDR: 40.0000 mg | DELAYED_RELEASE_CAPSULE | Freq: Every day | ORAL | Status: DC

## 2015-02-05 ENCOUNTER — Encounter (HOSPITAL_COMMUNITY): Payer: Self-pay | Admitting: Emergency Medicine

## 2015-02-05 ENCOUNTER — Encounter: Payer: Self-pay | Admitting: Internal Medicine

## 2015-02-05 ENCOUNTER — Emergency Department (HOSPITAL_COMMUNITY)
Admission: EM | Admit: 2015-02-05 | Discharge: 2015-02-05 | Disposition: A | Discharge status: Home / Self Care | Payer: 59 | Attending: Emergency Medicine | Admitting: Emergency Medicine

## 2015-02-05 DIAGNOSIS — Z7952 Long term (current) use of systemic steroids: Secondary | ICD-10-CM | POA: Insufficient documentation

## 2015-02-05 DIAGNOSIS — J45909 Unspecified asthma, uncomplicated: Secondary | ICD-10-CM | POA: Insufficient documentation

## 2015-02-05 DIAGNOSIS — R51 Headache: Secondary | ICD-10-CM | POA: Insufficient documentation

## 2015-02-05 DIAGNOSIS — M792 Neuralgia and neuritis, unspecified: Secondary | ICD-10-CM

## 2015-02-05 DIAGNOSIS — K219 Gastro-esophageal reflux disease without esophagitis: Secondary | ICD-10-CM | POA: Insufficient documentation

## 2015-02-05 DIAGNOSIS — Z86010 Personal history of colonic polyps: Secondary | ICD-10-CM | POA: Insufficient documentation

## 2015-02-05 DIAGNOSIS — Z8673 Personal history of transient ischemic attack (TIA), and cerebral infarction without residual deficits: Secondary | ICD-10-CM | POA: Insufficient documentation

## 2015-02-05 DIAGNOSIS — E669 Obesity, unspecified: Secondary | ICD-10-CM | POA: Insufficient documentation

## 2015-02-05 DIAGNOSIS — Z7951 Long term (current) use of inhaled steroids: Secondary | ICD-10-CM | POA: Insufficient documentation

## 2015-02-05 DIAGNOSIS — Z792 Long term (current) use of antibiotics: Secondary | ICD-10-CM | POA: Insufficient documentation

## 2015-02-05 DIAGNOSIS — M5410 Radiculopathy, site unspecified: Secondary | ICD-10-CM | POA: Insufficient documentation

## 2015-02-05 DIAGNOSIS — Z7982 Long term (current) use of aspirin: Secondary | ICD-10-CM | POA: Insufficient documentation

## 2015-02-05 DIAGNOSIS — F329 Major depressive disorder, single episode, unspecified: Secondary | ICD-10-CM | POA: Insufficient documentation

## 2015-02-05 DIAGNOSIS — I10 Essential (primary) hypertension: Secondary | ICD-10-CM | POA: Insufficient documentation

## 2015-02-05 LAB — URINALYSIS, ROUTINE W REFLEX MICROSCOPIC
Bilirubin Urine: NEGATIVE
Glucose, UA: NEGATIVE mg/dL
Hgb urine dipstick: NEGATIVE
Ketones, ur: NEGATIVE mg/dL
LEUKOCYTES UA: NEGATIVE
NITRITE: NEGATIVE
Protein, ur: NEGATIVE mg/dL
SPECIFIC GRAVITY, URINE: 1.009 (ref 1.005–1.030)
Urobilinogen, UA: 1 mg/dL (ref 0.0–1.0)
pH: 7.5 (ref 5.0–8.0)

## 2015-02-05 LAB — I-STAT CHEM 8, ED
BUN: 6 mg/dL (ref 6–23)
Calcium, Ion: 1.17 mmol/L (ref 1.13–1.30)
Chloride: 101 mmol/L (ref 96–112)
Creatinine, Ser: 0.8 mg/dL (ref 0.50–1.10)
GLUCOSE: 82 mg/dL (ref 70–99)
HCT: 38 % (ref 36.0–46.0)
HEMOGLOBIN: 12.9 g/dL (ref 12.0–15.0)
Potassium: 3.5 mmol/L (ref 3.5–5.1)
Sodium: 140 mmol/L (ref 135–145)
TCO2: 25 mmol/L (ref 0–100)

## 2015-02-05 LAB — I-STAT TROPONIN, ED: Troponin i, poc: 0 ng/mL (ref 0.00–0.08)

## 2015-02-05 NOTE — Discharge Instructions (Signed)

## 2015-02-05 NOTE — ED Provider Notes (Signed)
CSN: [id number]     Arrival date & time 02/05/15  1740 History   First MD Initiated Contact with Patient 02/05/15 2008     Chief Complaint  Patient presents with  . left arm pain      (Consider location/radiation/quality/duration/timing/severity/associated sxs/prior Treatment) Patient is a 64 y.o. female presenting with extremity pain. The history is provided by the patient. No language interpreter was used.  Extremity Pain This is a new problem. The current episode started today. The problem has been gradually improving. Associated symptoms include headaches and urinary symptoms. Pertinent negatives include no chest pain, nausea, numbness, visual change or weakness.    Past Medical History  Diagnosis Date  . Hypertension   . GERD (gastroesophageal reflux disease)   . Allergic rhinitis   . Asthma   . Obesity   . Depression   . History of colonic polyps   . DJD (degenerative joint disease), cervical   . 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 Stroke    Past Surgical History  Procedure Laterality Date  . Nasal sinus surgery  07/2006    Dr. [name]  . Nasal polyp surgery  07/2006    x 2 with Dr. [name]  . Foot surgery  1998  . Tubal ligation    . 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 Colon cancer Brother   . Diabetes Sister    History  Substance Use Topics  . Smoking status: Never Smoker   . Smokeless tobacco: Never Used  . Alcohol Use: No   OB History    No data available     Review of Systems  Respiratory: Negative for shortness of breath.   Cardiovascular: Negative for chest pain.  Gastrointestinal: Negative for nausea.  Neurological: Positive for headaches. Negative for facial asymmetry, speech difficulty, weakness and numbness.  All other systems reviewed and are negative.     Allergies  Tramadol; Ivp dye; and  Promethazine-codeine  Home Medications   Prior to Admission medications   Medication Sig Start Date End Date Taking? Authorizing Provider  acetaminophen (TYLENOL) 325 MG tablet Take 650 mg by mouth every 6 (six) hours as needed.   Yes Historical Provider, MD  albuterol (PROVENTIL HFA;VENTOLIN HFA) 108 (90 BASE) MCG/ACT inhaler Inhale 2 puffs into the lungs every 6 (six) hours as needed for wheezing or shortness of breath. 10/20/14  Yes Tanda Rockers, MD  amLODipine (NORVASC) 5 MG tablet Take 1 tablet (5 mg total) by mouth daily. 12/15/14  Yes Biagio Borg, MD  aspirin 325 MG tablet Take 1 tablet (325 mg total) by mouth daily. 09/09/14  Yes Biagio Borg, MD  baclofen (LIORESAL) 10 MG tablet Take 10 mg by mouth 3 (three) times daily as needed for muscle spasms.  06/09/14  Yes Philmore Pali, NP  Biotin 5000 MCG CAPS Take 1 capsule by mouth daily.   Yes Historical Provider, MD  cetirizine (ZYRTEC) 10 MG tablet Take 10 mg by mouth daily.   Yes Historical Provider, MD  dextromethorphan-guaiFENesin (MUCINEX DM) 30-600 MG per 12 hr tablet Take 1 tablet by mouth 2 (two) times daily as needed for cough.    Yes Historical Provider, MD  DITROPAN XL 5 MG 24 hr tablet Take 1 tablet by mouth at bedtime. 01/06/15  Yes Historical Provider, MD  esomeprazole (NEXIUM) 40 MG capsule Take 1 capsule (  40 mg total) by mouth daily at 12 noon. 02/03/15  Yes Biagio Borg, MD  fluticasone (FLONASE) 50 MCG/ACT nasal spray Place 2 sprays into both nostrils 2 (two) times daily.   Yes Historical Provider, MD  gabapentin (NEURONTIN) 100 MG capsule Take 100 mg by mouth 3 (three) times daily as needed (pain).    Yes Historical Provider, MD  mometasone (NASONEX) 50 MCG/ACT nasal spray Place 2 sprays into the nose daily. Patient taking differently: Place 2 sprays into the nose daily as needed (allergies).  10/20/14  Yes Tanda Rockers, MD  montelukast (SINGULAIR) 10 MG tablet Take 1 tablet (10 mg total) by mouth at bedtime. 10/20/14  Yes  Tanda Rockers, MD  oxymetazoline (AFRIN) 0.05 % nasal spray Place 2 sprays into both nostrils 2 (two) times daily as needed for congestion.   Yes Historical Provider, MD  polyethylene glycol powder (GLYCOLAX/MIRALAX) powder Take 1 Container by mouth 2 (two) times daily as needed for moderate constipation.  07/18/14  Yes Courtney Forcucci, PA-C  ranitidine (ZANTAC) 150 MG tablet Take 150 mg by mouth at bedtime.   Yes Historical Provider, MD  rosuvastatin (CRESTOR) 10 MG tablet Take 10 mg by mouth daily.   Yes Historical Provider, MD  spironolactone (ALDACTONE) 25 MG tablet Take 25 mg by mouth daily.   Yes Historical Provider, MD  zinc gluconate 50 MG tablet Take 50 mg by mouth daily as needed (health).    Yes Historical Provider, MD  amoxicillin-clavulanate (AUGMENTIN) 875-125 MG per tablet Take 1 tablet by mouth 2 (two) times daily. Patient not taking: Reported on 02/05/2015 12/11/14   Tanda Rockers, MD  fluconazole (DIFLUCAN) 150 MG tablet 1 tab by mouth every 3 days as needed Patient not taking: Reported on 02/05/2015 01/12/15   Biagio Borg, MD  omeprazole (PRILOSEC) 20 MG capsule TAKE 1 CAPSULE (20 MG TOTAL) BY MOUTH EVERY MORNING. Patient not taking: Reported on 02/05/2015 12/07/14   Tanda Rockers, MD  oxybutynin (DITROPAN) 5 MG tablet TAKE ONE TABLET BY MOUTH DAILY AT BEDTIME Patient not taking: Reported on 02/05/2015 12/07/14   Biagio Borg, MD   BP 130/86 mmHg  Pulse 71  Temp(Src) 97.5 F (36.4 C) (Oral)  Resp 18  SpO2 100% Physical Exam  Constitutional: She is oriented to person, place, and time. She appears well-developed and well-nourished.  HENT:  Head: Normocephalic.  Eyes: EOM are normal. Pupils are equal, round, and reactive to light.  Neck: Neck supple.  Cardiovascular: Normal rate and regular rhythm.   Pulmonary/Chest: Effort normal and breath sounds normal.  Abdominal: Soft. Bowel sounds are normal.  Musculoskeletal: She exhibits no edema.       Left upper arm: She exhibits no  bony tenderness, no swelling, no edema and no deformity.       Arms: Lymphadenopathy:    She has no cervical adenopathy.  Neurological: She is alert and oriented to person, place, and time. No cranial nerve deficit.  Skin: Skin is warm and dry.  Psychiatric: She has a normal mood and affect.  Nursing note and vitals reviewed.   ED Course  Procedures (including critical care time) Labs Review Labs Reviewed  [place], ED  I-STAT CHEM 8, ED    Imaging Review No results found.   EKG Interpretation   Date/Time:  Friday February 05 2015 17:56:28 EST Ventricular Rate:  63 PR Interval:  169 QRS Duration: 82 QT Interval:  408 QTC Calculation:  418 R Axis:   6 Text Interpretation:  Sinus rhythm Low voltage, precordial leads since  last tracing no significant change Confirmed by WENTZ  MD, ELLIOTT [phone])  on 02/05/2015 6:01:05 PM     Patient with intermittent left arm pain, appears to be radicular in nature.  History of prior neck injury.  No recent injury to neck or arm.  No strength deficit noted. Labs reviewed and results shared with patient.   MDM   Final diagnoses:  None    Radicular pain.    Norman Herrlich, NP 02/06/15 [id number]  Richarda Blade, MD 02/06/15 [phone]

## 2015-02-05 NOTE — ED Notes (Signed)
Pt from home c/o sharp left arm pain x 2 starting today. Denies shortness of breath and denies chest pain. She reports difficulties "clearing throat".

## 2015-02-08 ENCOUNTER — Ambulatory Visit: Payer: Self-pay | Admitting: Adult Health

## 2015-02-12 ENCOUNTER — Ambulatory Visit (INDEPENDENT_AMBULATORY_CARE_PROVIDER_SITE_OTHER): Payer: 59 | Admitting: *Deleted

## 2015-02-12 DIAGNOSIS — I6330 Cerebral infarction due to thrombosis of unspecified cerebral artery: Secondary | ICD-10-CM

## 2015-02-16 ENCOUNTER — Ambulatory Visit: Payer: 59 | Admitting: Internal Medicine

## 2015-02-18 NOTE — Progress Notes (Signed)
Loop recorder 

## 2015-02-19 ENCOUNTER — Encounter: Payer: Self-pay | Admitting: Cardiology

## 2015-02-26 ENCOUNTER — Other Ambulatory Visit: Payer: Self-pay | Admitting: Internal Medicine

## 2015-03-02 LAB — MDC_IDC_ENUM_SESS_TYPE_REMOTE

## 2015-03-06 ENCOUNTER — Telehealth: Payer: Self-pay | Admitting: *Deleted

## 2015-03-06 NOTE — Telephone Encounter (Signed)
Pt stopped by office today (during the end of Saturday [facility]) & asked [place] to check her BP. Patient BP was 126/80 (both arms-sitting). Weight: 150lbs. Pt asked that I forward this to her PCP.

## 2015-03-12 ENCOUNTER — Telehealth: Payer: Self-pay | Admitting: Internal Medicine

## 2015-03-12 ENCOUNTER — Ambulatory Visit (INDEPENDENT_AMBULATORY_CARE_PROVIDER_SITE_OTHER): Payer: 59 | Admitting: Internal Medicine

## 2015-03-12 ENCOUNTER — Encounter: Payer: Self-pay | Admitting: Internal Medicine

## 2015-03-12 VITALS — BP 118/80 | HR 72 | Temp 97.8°F | Resp 18 | Ht 60.0 in | Wt 152.1 lb

## 2015-03-12 DIAGNOSIS — J309 Allergic rhinitis, unspecified: Secondary | ICD-10-CM

## 2015-03-12 DIAGNOSIS — M25562 Pain in left knee: Secondary | ICD-10-CM | POA: Insufficient documentation

## 2015-03-12 DIAGNOSIS — I10 Essential (primary) hypertension: Secondary | ICD-10-CM

## 2015-03-12 DIAGNOSIS — J302 Other seasonal allergic rhinitis: Secondary | ICD-10-CM

## 2015-03-12 DIAGNOSIS — M25561 Pain in right knee: Secondary | ICD-10-CM

## 2015-03-12 DIAGNOSIS — J3089 Other allergic rhinitis: Principal | ICD-10-CM

## 2015-03-12 MED ORDER — METHYLPREDNISOLONE ACETATE 80 MG/ML IJ SUSP
80.0000 mg | Freq: Once | INTRAMUSCULAR | Status: AC
  Administered 2015-03-12: 80 mg via INTRAMUSCULAR

## 2015-03-12 NOTE — Assessment & Plan Note (Signed)
Mild to mod, for re-start zyrtec/flonase,  to f/u any worsening symptoms or concerns

## 2015-03-12 NOTE — Assessment & Plan Note (Signed)
C/w mild bony spurring left knee, d/w pt, benign, mild discomfort only, for tylenol prn

## 2015-03-12 NOTE — Patient Instructions (Signed)
Please continue all other medications as before, including re-start of the zyrtec and flonase  Please have the pharmacy call with any other refills you may need.  Please continue your efforts at being more active, low cholesterol diet, and weight control.  You are otherwise up to date with prevention measures today.  Please keep your appointments with your specialists as you may have planned  OK for tylenol as needed for pain

## 2015-03-12 NOTE — Progress Notes (Signed)
Subjective:    Patient ID: Andrea Mason, female    DOB: Feb 24, 1951, 64 y.o.   MRN: [id number]  HPI Does have several wks ongoing nasal allergy symptoms with clearish congestion, itch and sneezing, without fever, pain, ST, cough, swelling or wheezing.  Does have a bony slightly tender spot to left lateral knee, and also a tender spot just below the right knee over the past wk, urged by family to ask about this.  No knee swelling, giveaways, or falls.  Pt denies chest pain, increased sob or doe, wheezing, orthopnea, PND, increased LE swelling, palpitations, dizziness or syncope.  Pt denies new neurological symptoms such as new headache, or facial or extremity weakness or numbness Past Medical History  Diagnosis Date  . Hypertension   . GERD (gastroesophageal reflux disease)   . Allergic rhinitis   . Asthma   . Obesity   . Depression   . History of colonic polyps   . DJD (degenerative joint disease), cervical   . 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 Stroke    Past Surgical History  Procedure Laterality Date  . Nasal sinus surgery  07/2006    Dr. [name]  . Nasal polyp surgery  07/2006    x 2 with Dr. [name]  . Foot surgery  1998  . Tubal ligation    . 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 She has never used smokeless tobacco. She reports that she does not drink alcohol or use illicit drugs. family history includes Colon cancer in her brother; Diabetes in her sister. Allergies  Allergen Reactions  . Tramadol Other (See Comments)    Almost passed out  . Ivp Dye [Iodinated Diagnostic Agents] Nausea And Vomiting and Other (See Comments)    Reaction: hot flashes  . Promethazine-Codeine Nausea And Vomiting   Current Outpatient Prescriptions on File Prior to Visit  Medication Sig Dispense Refill  . acetaminophen (TYLENOL) 325 MG tablet Take 650 mg by mouth  every 6 (six) hours as needed.    Marland Kitchen albuterol (PROVENTIL HFA;VENTOLIN HFA) 108 (90 BASE) MCG/ACT inhaler Inhale 2 puffs into the lungs every 6 (six) hours as needed for wheezing or shortness of breath. 1 Inhaler 11  . amLODipine (NORVASC) 5 MG tablet Take 1 tablet (5 mg total) by mouth daily. 90 tablet 3  . amoxicillin-clavulanate (AUGMENTIN) 875-125 MG per tablet Take 1 tablet by mouth 2 (two) times daily. 40 tablet 0  . aspirin 325 MG tablet Take 1 tablet (325 mg total) by mouth daily. 90 tablet 11  . baclofen (LIORESAL) 10 MG tablet Take 10 mg by mouth 3 (three) times daily as needed for muscle spasms.     . Biotin 5000 MCG CAPS Take 1 capsule by mouth daily.    . cetirizine (ZYRTEC) 10 MG tablet Take 10 mg by mouth daily.    Marland Kitchen dextromethorphan-guaiFENesin (MUCINEX DM) 30-600 MG per 12 hr tablet Take 1 tablet by mouth 2 (two) times daily as needed for cough.     Marland Kitchen DITROPAN XL 5 MG 24 hr tablet Take 1 tablet by mouth at bedtime.  4  . esomeprazole (NEXIUM) 40 MG capsule Take 1 capsule (40 mg total) by mouth daily at 12 noon. 90 capsule 2  . fluconazole (DIFLUCAN) 150 MG tablet 1 tab by mouth every 3 days as needed 2 tablet 1  . fluticasone (FLONASE) 50  MCG/ACT nasal spray Place 2 sprays into both nostrils 2 (two) times daily.    Marland Kitchen gabapentin (NEURONTIN) 100 MG capsule Take 100 mg by mouth 3 (three) times daily as needed (pain).     . mometasone (NASONEX) 50 MCG/ACT nasal spray Place 2 sprays into the nose daily. (Patient taking differently: Place 2 sprays into the nose daily as needed (allergies). ) 17 g 11  . mometasone-formoterol (DULERA) 100-5 MCG/ACT AERO Inhale 2 puffs into the lungs 2 (two) times daily.    . montelukast (SINGULAIR) 10 MG tablet TAKE 1 TABLET BY MOUTH AT BEDTIME 30 tablet 0  . omeprazole (PRILOSEC) 20 MG capsule TAKE 1 CAPSULE (20 MG TOTAL) BY MOUTH EVERY MORNING. 30 capsule 0  . oxybutynin (DITROPAN) 5 MG tablet TAKE ONE TABLET BY MOUTH DAILY AT BEDTIME 30 tablet 11  .  oxymetazoline (AFRIN) 0.05 % nasal spray Place 2 sprays into both nostrils 2 (two) times daily as needed for congestion.    . polyethylene glycol powder (GLYCOLAX/MIRALAX) powder Take 1 Container by mouth 2 (two) times daily as needed for moderate constipation.     . ranitidine (ZANTAC) 150 MG tablet Take 150 mg by mouth at bedtime.    . rosuvastatin (CRESTOR) 10 MG tablet Take 10 mg by mouth daily.    Marland Kitchen spironolactone (ALDACTONE) 25 MG tablet Take 25 mg by mouth daily.    Marland Kitchen zinc gluconate 50 MG tablet Take 50 mg by mouth daily as needed (health).      No current facility-administered medications on file prior to visit.   Review of Systems  Constitutional: Negative for unusual diaphoresis or night sweats HENT: Negative for ringing in ear or discharge Eyes: Negative for double vision or worsening visual disturbance.  Respiratory: Negative for choking and stridor.   Gastrointestinal: Negative for vomiting or other signifcant bowel change Genitourinary: Negative for hematuria or change in urine volume.  Musculoskeletal: Negative for other MSK pain or swelling Skin: Negative for color change and worsening wound.  Neurological: Negative for tremors and numbness other than noted  Psychiatric/Behavioral: Negative for decreased concentration or agitation other than above       Objective:   Physical Exam BP 118/80 mmHg  Pulse 72  Temp(Src) 97.8 F (36.6 C) (Oral)  Resp 18  Ht 5' (1.524 m)  Wt 152 lb 1.9 oz (69.001 kg)  BMI 29.71 kg/m2  SpO2 95% VS noted,  Constitutional: Pt appears in no significant distress HENT: Head: NCAT.  Right Ear: External ear normal.  Left Ear: External ear normal.  Eyes: . Pupils are equal, round, and reactive to light. Conjunctivae and EOM are normal Neck: Normal range of motion. Neck supple.  Cardiovascular: Normal rate and regular rhythm.   Bilat tm's with mild erythema.  Max sinus areas non tender.  Pharynx with mild erythema, no exudate Pulmonary/Chest:  Effort normal and breath sounds without rales or wheezing.  Neurological: Pt is alert. Not confused , motor grossly intact Skin: Skin is warm. No rash, no LE edema Psychiatric: Pt behavior is normal. No agitation.  Left knee with bony spur like tender to left lateral knee near joint lie Right knee with tender pretibial prominence with some bony enlargement       Assessment & Plan:

## 2015-03-12 NOTE — Assessment & Plan Note (Signed)
C/w mild pretibal tendonitis, d/w pt, benign, mild discomfort only, for tylenol prn

## 2015-03-12 NOTE — Assessment & Plan Note (Signed)
stable overall by history and exam, recent data reviewed with pt, and pt to continue medical treatment as before,  to f/u any worsening symptoms or concerns BP Readings from Last 3 Encounters:  03/12/15 118/80  02/05/15 113/84  01/04/15 126/80

## 2015-03-12 NOTE — Telephone Encounter (Signed)
Samples of Dulera 100 have been left up front for pick up. Pt is aware. Nothing further was needed.

## 2015-03-12 NOTE — Progress Notes (Signed)
Pre visit review using our clinic review tool, if applicable. No additional management support is needed unless otherwise documented below in the visit note. 

## 2015-03-15 ENCOUNTER — Ambulatory Visit (INDEPENDENT_AMBULATORY_CARE_PROVIDER_SITE_OTHER): Payer: Self-pay | Admitting: *Deleted

## 2015-03-15 DIAGNOSIS — I6330 Cerebral infarction due to thrombosis of unspecified cerebral artery: Secondary | ICD-10-CM

## 2015-03-18 NOTE — Progress Notes (Signed)
Loop recorder 

## 2015-03-23 ENCOUNTER — Telehealth: Payer: Self-pay | Admitting: Internal Medicine

## 2015-03-23 ENCOUNTER — Ambulatory Visit (INDEPENDENT_AMBULATORY_CARE_PROVIDER_SITE_OTHER): Payer: Self-pay | Admitting: Adult Health

## 2015-03-23 ENCOUNTER — Telehealth: Payer: Self-pay | Admitting: Adult Health

## 2015-03-23 ENCOUNTER — Encounter: Payer: Self-pay | Admitting: Adult Health

## 2015-03-23 VITALS — BP 116/78 | HR 78 | Temp 98.1°F | Ht 59.0 in | Wt 149.6 lb

## 2015-03-23 DIAGNOSIS — J4530 Mild persistent asthma, uncomplicated: Secondary | ICD-10-CM

## 2015-03-23 MED ORDER — PREDNISONE 10 MG PO TABS: ORAL_TABLET | ORAL | Status: DC

## 2015-03-23 MED ORDER — AZITHROMYCIN 250 MG PO TABS: ORAL_TABLET | ORAL | Status: AC

## 2015-03-23 NOTE — Telephone Encounter (Signed)
Pt calling requesting copy of med calendar be mailed to her. Did not get a copy today at visit.  This has been been placed in mail. Nothing further needed.

## 2015-03-23 NOTE — Assessment & Plan Note (Signed)
Flare with URI   Plan  Prednisone taper over next week.  Zpack take as directed.  Mucinex DM Twice daily  As needed  Cough/congestion .  Fluids and rest .  Saline nasal rinses As needed   Follow up Dr. Melvyn Novas  In 2 months and As needed   Please contact office for sooner follow up if symptoms do not improve or worsen or seek emergency care

## 2015-03-23 NOTE — Patient Instructions (Signed)
Prednisone taper over next week.  Zpack take as directed.  Mucinex DM Twice daily  As needed  Cough/congestion .  Fluids and rest .  Saline nasal rinses As needed   Follow up Dr. Melvyn Novas  In 2 months and As needed   Please contact office for sooner follow up if symptoms do not improve or worsen or seek emergency care

## 2015-03-23 NOTE — Progress Notes (Signed)
Subjective:    Patient ID: Andrea Mason, female   DOB: 02/10/1951   MRN: [id number]     Brief patient profile:  65 yobf never smoker followed by Dr Annamaria Boots for allergic rhinitis and asthma   History of Present Illness  02/05/2013 1st pulmonary eval in [facility] era baseline = completely better on qvar 80 2bid and only use rescue once a week at baseline then much worse starting 3/3 with cough/ congestion/ green mucus and rescue x one by time of ov at 2pm.  No resting sob, very hoarse with harsh barking cough >Augmentin rx    DATE OF ADMISSION: 11/25/2013  DATE OF DISCHARGE: 12/10/2013   DISCHARGE DIAGNOSES:  1. Embolic left middle cerebral artery infarction.  2. Subcutaneous Lovenox for deep venous thrombosis prophylaxis.  3. Dysphagia.  4. Hypertension.  5. Acute sinusitis, resolved.  6. Hyperlipidemia.  7. Gastroesophageal reflux disease.  8. Asthma.   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 Acute Visit    Pt c/o nasal congestion for the past few months. She also c/o cough- esp when she lies down.     No need for saba rec Augmentin 875 mg take one pill twice daily  X 10 days - take at breakfast and supper with large glass of water.  It would help reduce the usual side effects (diarrhea and yeast infections) if you ate cultured yogurt at lunch.  If sinuses not better after this please see Dr [name] Please schedule a follow up visit in 3 months but call sooner if needed to see [name] with all med  in hand and calendar we reviewed today    12/11/2014   Acute ov/Wert re: Jaynie Bream Teryl Lucy not better with saba / no  med calendar  Chief Complaint  Patient presents with  . Acute Visit    Pt c/o increased SOB for a little over a wk now. She c/o nasal congestion. She also c/o "bad wheezing at night"- wakes her up sometimes.   has not been back to Dr [name], says people come in and steal her papers for disability and med calendars so hasn't been able to use either set Afrin really helps but doesn't know how to use it.  >>augmentin x 20 d .   01/04/2015 Follow up : Asthma /Chronic Rhinitis  Patient returns for a 4 week follow-up Last visit. Patient with asthma exacerbation and sinusitis. She was treated with Augmentin 20 days. She's been recommended to follow-up with ENT and has an upcoming appointment. She says since last visit. She is starting to improve. She has decreased nasal congestion. Breathing has returned back to baseline. She denies flare cough or wheezing. She denies any hemoptysis, orthopnea, PND or leg swelling. >cont on current regimen  03/23/2015 Acute OV  Presents for an acute office visit.  Complains of 4 days of cough, congestion, green mucus, wheezing .  She denies any chest pain, orthopnea, PND or leg swelling Has been using Mucinex and flutter valve. Patient is currently working with health department for her medications as she is  working on Print production planner. She has not been able to go to the ENT due to this..   Current Medications, Allergies, Complete Past Medical History, Past Surgical History, Family History, and Social History were reviewed in [employer] record.  ROS  The following are not active complaints unless bolded sore throat, dysphagia, dental problems, itching, sneezing,  nasal congestion ear ache,   fever, chills, sweats, unintended wt loss, pleuritic or exertional cp, hemoptysis,  orthopnea pnd or leg swelling, presyncope, palpitations, heartburn, abdominal pain, anorexia, nausea, vomiting, diarrhea  or change in bowel or urinary habits,  change in stools or urine, dysuria,hematuria,  rash, arthralgias, visual complaints, headache, numbness weakness or ataxia or problems with walking or coordination,  change in mood/affect or memory.                    OBJ- Physical Exam   amb wf    General- Alert, nad nasal tone to voice  12/11/2014         155 >149 01/04/2015 >149 03/23/2015   Vital signs reviewed   HEENT: nl dentition, turbinates, and orophanx. Nl external ear canals without cough reflex, no thrush noted    NECK :  without JVD/Nodes/TM/ nl carotid upstrokes bilaterally   LUNGS: no acc muscle use, few trace exp wheezing   CV:  RRR  no s3 or murmur or increase in P2, no edema   ABD:  soft and nontender with nl excursion in the supine position. No bruits or organomegaly, bowel sounds nl  MS:  warm without deformities, calf tenderness, cyanosis or clubbing  SKIN: warm and dry without lesions         MRI 05/25/14 Pansinusitis                    Assessment:

## 2015-03-23 NOTE — Telephone Encounter (Signed)
Spoke with the pt  She states c/o sore throat, chills, and prod cough with minimal green sputum x 2 wks  OV with TP at 3:30 pm today

## 2015-03-24 NOTE — Addendum Note (Signed)
Addended by: Parke Poisson E on: 03/24/2015 09:31 AM   Modules accepted: Orders, Medications

## 2015-04-05 ENCOUNTER — Encounter: Payer: Self-pay | Admitting: Internal Medicine

## 2015-04-05 ENCOUNTER — Ambulatory Visit (INDEPENDENT_AMBULATORY_CARE_PROVIDER_SITE_OTHER): Payer: Self-pay | Admitting: Internal Medicine

## 2015-04-05 ENCOUNTER — Telehealth: Payer: Self-pay | Admitting: Internal Medicine

## 2015-04-05 VITALS — BP 120/88 | HR 93 | Ht 59.0 in | Wt 147.0 lb

## 2015-04-05 DIAGNOSIS — J383 Other diseases of vocal cords: Secondary | ICD-10-CM | POA: Insufficient documentation

## 2015-04-05 DIAGNOSIS — J45901 Unspecified asthma with (acute) exacerbation: Secondary | ICD-10-CM

## 2015-04-05 DIAGNOSIS — J45909 Unspecified asthma, uncomplicated: Secondary | ICD-10-CM | POA: Insufficient documentation

## 2015-04-05 MED ORDER — PREDNISONE 10 MG PO TABS: ORAL_TABLET | ORAL | Status: DC

## 2015-04-05 MED ORDER — AZITHROMYCIN 250 MG PO TABS: ORAL_TABLET | ORAL | Status: DC

## 2015-04-05 NOTE — Patient Instructions (Addendum)
    ICD-9-CM ICD-10-CM   1. Asthma with exacerbation, unspecified asthma severity 493.92 J45.901   2. Vocal cord dysfunction 478.5 J38.3    Unclear if you have asthma flareup or vocal cord flareup or both  - cat and flowers could have triggered it Please take prednisone 40 mg x1 day, then 30 mg x1 day, then 20 mg x1 day, then 10 mg x1 day, and then 5 mg x1 day and stop Please take Z pak  Followup  - discuss with Dr Melvyn Novas all followup questions Future Appointments Date Time Provider [place]  05/25/2015 3:30 PM Tanda Rockers, MD [facility] None  06/24/2015 2:45 PM Garvin Fila, MD [facility] None

## 2015-04-05 NOTE — Progress Notes (Signed)
Subjective:    Patient ID: Andrea Mason, female    DOB: [birth year], 64 y.o.   MRN: [id number]  HPI    Brief patient profile:  25 yobf never smoker followed by Dr Annamaria Boots for allergic rhinitis and asthma   History of Present Illness  02/05/2013 1st pulmonary eval in EPIC era baseline = completely better on qvar 80 2bid and only use rescue once a week at baseline then much worse starting 3/3 with cough/ congestion/ green mucus and rescue x one by time of ov at 2pm.  No resting sob, very hoarse with harsh barking cough >Augmentin rx    DATE OF ADMISSION: 11/25/2013  DATE OF DISCHARGE: 12/10/2013   DISCHARGE DIAGNOSES:  1. Embolic left middle cerebral artery infarction.  2. Subcutaneous Lovenox for deep venous thrombosis prophylaxis.  3. Dysphagia.  4. Hypertension.  5. Acute sinusitis, resolved.  6. Hyperlipidemia.  7. Gastroesophageal reflux disease.  8. Asthma.   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 Acute Visit    Pt c/o nasal congestion for the past few months. She also c/o cough- esp when she lies down.     No need for saba rec Augmentin 875 mg take one pill twice daily  X 10 days - take at breakfast and supper with large glass of water.  It would help reduce the usual side effects (diarrhea and yeast infections) if you ate cultured yogurt at lunch.  If sinuses not better after this please see Dr [name] Please schedule a follow up visit in 3 months but call sooner if needed to see [name] with all med  in hand and calendar we reviewed today    12/11/2014   Acute ov/Wert re: Jaynie Bream Teryl Lucy not  better with saba / no med calendar  Chief Complaint  Patient presents with  . Acute Visit    Pt c/o increased SOB for a little over a wk now. She c/o nasal congestion. She also c/o "bad wheezing at night"- wakes her up sometimes.   has not been back to Dr [name], says people come in and steal her papers for disability and med calendars so hasn't been able to use either set Afrin really helps but doesn't know how to use it.  >>augmentin x 20 d .   01/04/2015 Follow up : Asthma /Chronic Rhinitis  Patient returns for a 4 week follow-up Last visit. Patient with asthma exacerbation and sinusitis. She was treated with Augmentin 20 days. She's been recommended to follow-up with ENT and has an upcoming appointment. She says since last visit. She is starting to improve. She has decreased nasal congestion. Breathing has returned back to baseline. She denies flare cough or wheezing. She denies any hemoptysis, orthopnea, PND or leg swelling. >cont on current regimen  03/23/2015 Acute OV  Presents for an acute office visit.  Complains of 4 days of cough, congestion, green mucus, wheezing .  She denies any chest pain, orthopnea, PND or leg swelling Has been using Mucinex and flutter valve. Patient is currently working with health department for  her medications as she is working on Print production planner. She has not been able to go to the ENT due to this..  REC Prednisone taper over next week.  Zpack take as directed.  Mucinex DM Twice daily  As needed  Cough/congestion .  Fluids and rest .  Saline nasal rinses As needed   Follow up Dr. Melvyn Novas  In 2 months and As needed   Please contact office for sooner follow up if symptoms do not improve or worsen or seek emergency care    OV 04/05/2015  Chief Complaint  Patient presents with  . Acute Visit    Pt saw TP on 4/19 and has not yet taken pred taper or zpak. Pt c/o hoarseness, prod cough green and white mucus. Pt denies CP/tightness and f/c/s.     64 year old female. Followed by Dr. Legrand Como wert for asthma/VCD  - . Here acute visit. She saw nurse practitioner 2 weeks ago but then did not follow through with instructions of prednisone and antibiotics. A few days ago got exposed to several cats at a potential employers place and also flowers. Yesterday she did sitting at her church at full voice but today she has lost her voice and is having increased cough and chest tightness with associated wheezing and extreme hoarse voice. In the Office she speaking with a low  squeaky voice. Symptoms are rated as severe but not severe enough to need admissioin. MDI not helping. Symptoms now ongoing x 2-3 days  Allergies  Allergen Reactions  . Tramadol Other (See Comments)    Almost passed out  . Ivp Dye [Iodinated Diagnostic Agents] Nausea And Vomiting and Other (See Comments)    Reaction: hot flashes  . 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   reports that she has never smoked. She has never used smokeless tobacco.  Past Surgical History  Procedure Laterality Date  . Nasal sinus surgery  07/2006    Dr. [name]  . Nasal polyp surgery  07/2006    x 2 with Dr. [name]  . Foot surgery  1998  . Tubal ligation    . 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 Tramadol Other (See Comments)    Almost passed out  . Ivp Dye [Iodinated Diagnostic Agents] Nausea And Vomiting and Other (See Comments)    Reaction: hot flashes  . Promethazine-Codeine  Nausea And Vomiting    Immunization History  Administered Date(s) Administered  . Influenza Split 09/03/2012  . Influenza Whole 09/27/2006, 08/23/2007, 11/16/2009, 08/25/2010  . Influenza,inj,Quad PF,36+ Mos 08/21/2013, 08/06/2014  . Pneumococcal Conjugate-13 12/02/2014  . Tdap 05/06/2013  . Zoster 05/06/2013    Family History  Problem Relation Age of Onset  . Colon cancer Brother   . Diabetes Sister      Current outpatient prescriptions:  .  albuterol (PROVENTIL HFA;VENTOLIN HFA) 108 (90 BASE) MCG/ACT inhaler, 1-2 puffs every 4-6 hours as needed for wheezing/shortness of breath, Disp: , Rfl:  .  amLODipine (NORVASC) 5 MG tablet, Take 1 tablet (5 mg total) by mouth daily., Disp: 90 tablet, Rfl:  3 .  aspirin 81 MG tablet, Take 81 mg by mouth daily., Disp: , Rfl:  .  Biotin 5000 MCG CAPS, Take 1 capsule by mouth daily., Disp: , Rfl:  .  cetirizine (ZYRTEC) 10 MG tablet, Take 10 mg by mouth at bedtime. , Disp: , Rfl:  .  dextromethorphan-guaiFENesin (MUCINEX DM) 30-600 MG per 12 hr tablet, Take 1-2 tablets by mouth 2 (two) times daily as needed for cough. , Disp: , Rfl:  .  esomeprazole (NEXIUM) 40 MG capsule, Take 1 capsule (40 mg total) by mouth daily at 12 noon., Disp: 90 capsule, Rfl: 2 .  gabapentin (NEURONTIN) 100 MG capsule, Take 100 mg by mouth 3 (three) times daily as needed (pain). , Disp: , Rfl:  .  mometasone (NASONEX) 50 MCG/ACT nasal spray, Place 2 sprays into the nose 2 (two) times daily., Disp: , Rfl:  .  mometasone-formoterol (DULERA) 100-5 MCG/ACT AERO, Inhale 2 puffs into the lungs 2 (two) times daily., Disp: , Rfl:  .  montelukast (SINGULAIR) 10 MG tablet, TAKE 1 TABLET BY MOUTH AT BEDTIME, Disp: 30 tablet, Rfl: 0 .  oxybutynin (DITROPAN) 5 MG tablet, TAKE ONE TABLET BY MOUTH DAILY AT BEDTIME, Disp: 30 tablet, Rfl: 11 .  oxymetazoline (AFRIN) 0.05 % nasal spray, Place 2 sprays into both nostrils 2 (two) times daily as needed for congestion (for 5 days). , Disp: ,  Rfl:  .  ranitidine (ZANTAC) 150 MG tablet, Take 150 mg by mouth at bedtime., Disp: , Rfl:  .  rosuvastatin (CRESTOR) 10 MG tablet, Take 10 mg by mouth daily., Disp: , Rfl:  .  sodium chloride (OCEAN) 0.65 % SOLN nasal spray, Place 2 sprays into both nostrils as needed for congestion., Disp: , Rfl:  .  spironolactone (ALDACTONE) 25 MG tablet, Take 25 mg by mouth daily., Disp: , Rfl:  .  predniSONE (DELTASONE) 10 MG tablet, 4 tabs for 2 days, then 3 tabs for 2 days, 2 tabs for 2 days, then 1 tab for 2 days, then stop (Patient not taking: Reported on 04/05/2015), Disp: 20 tablet, Rfl: 0     Review of Systems  Constitutional: Negative for fever and unexpected weight change.  HENT: Negative for congestion, dental problem, ear pain, nosebleeds, postnasal drip, rhinorrhea, sinus pressure, sneezing, sore throat and trouble swallowing.   Eyes: Negative for redness and itching.  Respiratory: Positive for cough and shortness of breath. Negative for chest tightness and wheezing.   Cardiovascular: Negative for palpitations and leg swelling.  Gastrointestinal: Negative for nausea and vomiting.  Genitourinary: Negative for dysuria.  Musculoskeletal: Negative for joint swelling.  Skin: Negative for rash.  Neurological: Negative for headaches.  Hematological: Does not bruise/bleed easily.  Psychiatric/Behavioral: Negative for dysphoric mood. The patient is not nervous/anxious.        Objective:   Physical Exam  Constitutional: She is oriented to person, place, and time. She appears well-developed and well-nourished. No distress.  HENT:  Head: Normocephalic and atraumatic.  Right Ear: External ear normal.  Left Ear: External ear normal.  Mouth/Throat: Oropharynx is clear and moist. No oropharyngeal exudate.  Extreme hoarse low squeaky voice  Eyes: Conjunctivae and EOM are normal. Pupils are equal, round, and reactive to light. Right eye exhibits no discharge. Left eye exhibits no discharge. No  scleral icterus.  Neck: Normal range of motion. Neck supple. No JVD present. No tracheal deviation present. No thyromegaly present.  Cardiovascular: Normal rate, regular rhythm, normal heart sounds and intact distal pulses.  Exam  reveals no gallop and no friction rub.   No murmur heard. Pulmonary/Chest: Effort normal and breath sounds normal. No respiratory distress. She has no wheezes. She has no rales. She exhibits no tenderness.  Abdominal: Soft. Bowel sounds are normal. She exhibits no distension and no mass. There is no tenderness. There is no rebound and no guarding.  Musculoskeletal: Normal range of motion. She exhibits no edema or tenderness.  Lymphadenopathy:    She has no cervical adenopathy.  Neurological: She is alert and oriented to person, place, and time. She has normal reflexes. No cranial nerve deficit. She exhibits normal muscle tone. Coordination normal.  Skin: Skin is warm and dry. No rash noted. She is not diaphoretic. No erythema. No pallor.  Psychiatric: She has a normal mood and affect. Her behavior is normal. Judgment and thought content normal.  Vitals reviewed.   Filed Vitals:   04/05/15 1241  BP: 120/88  Pulse: 93  Height: 4\' 11"  (1.499 m)  Weight: 147 lb (66.679 kg)  SpO2: 92%         Assessment & Plan:     ICD-9-CM ICD-10-CM   1. Asthma with exacerbation, unspecified asthma severity 493.92 J45.901   2. Vocal cord dysfunction 478.5 J38.3     Unclear if you have asthma flareup or vocal cord flareup or both  - cat and flowers could have triggered it Please take prednisone 40 mg x1 day, then 30 mg x1 day, then 20 mg x1 day, then 10 mg x1 day, and then 5 mg x1 day and stop Please take Z pak  Followup  - discuss with Dr Melvyn Novas all followup questions   Dr. Brand Males, M.D., [facility].C.P Pulmonary and Critical Care Medicine Staff Physician [address] [facility] Pager: [phone], If no answer or between  15:00h -  7:00h: call [phone]  04/13/2015 4:52 PM

## 2015-04-05 NOTE — Telephone Encounter (Signed)
Called spoke with pt. appt scheduled to see MR today at 12p. Nothing further needed

## 2015-04-08 ENCOUNTER — Telehealth: Payer: Self-pay | Admitting: Internal Medicine

## 2015-04-08 NOTE — Telephone Encounter (Signed)
Patient was getting oxybutynin (DITROPAN) 5 MG tablet [[id number] (actually the XL) with the MAP program for free through [employer]. She received a letter from them that they will no longer be doing that and they are making it more affordable. Could you please call the patient regarding this medication.

## 2015-04-08 NOTE — Telephone Encounter (Signed)
Patient called back regarding note below. Please call you asap.

## 2015-04-09 MED ORDER — OXYBUTYNIN CHLORIDE ER 5 MG PO TB24: 5.0000 mg | ORAL_TABLET | Freq: Every day | ORAL | Status: DC

## 2015-04-09 NOTE — Telephone Encounter (Signed)
Patient needs a prescription for the Ditroplan XL 5 mg instead of the regular Ditroplan 5mg . She tried to take the bottle up to [facility] yesterday and they would not fill it since it didn't come from them. She wants the prescription to be printed out for her.

## 2015-04-09 NOTE — Telephone Encounter (Signed)
Done hardcopy to Cherina  

## 2015-04-09 NOTE — Telephone Encounter (Signed)
Faxed rx to [facility].

## 2015-04-14 ENCOUNTER — Ambulatory Visit (INDEPENDENT_AMBULATORY_CARE_PROVIDER_SITE_OTHER): Payer: 59 | Admitting: *Deleted

## 2015-04-14 ENCOUNTER — Telehealth: Payer: Self-pay

## 2015-04-14 DIAGNOSIS — I6330 Cerebral infarction due to thrombosis of unspecified cerebral artery: Secondary | ICD-10-CM

## 2015-04-14 NOTE — Telephone Encounter (Signed)
[facility] sent over a fax that stated the Ditropan XL 5mg  is no longer available for free. They wanted to know if you wanted to change the prescription to either Detrol LA or Toviaz? If so, they need a new fax.

## 2015-04-16 MED ORDER — FESOTERODINE FUMARATE ER 4 MG PO TB24: 4.0000 mg | ORAL_TABLET | Freq: Every day | ORAL | Status: DC

## 2015-04-16 NOTE — Telephone Encounter (Signed)
[place] for change to [place] 4 mg  - done erx to [employer]

## 2015-04-16 NOTE — Telephone Encounter (Signed)
Faxed script to [facility] @ [phone]...Johny Chess

## 2015-04-16 NOTE — Progress Notes (Signed)
Loop recorder 

## 2015-04-19 LAB — CUP PACEART REMOTE DEVICE CHECK: Date Time Interrogation Session: 20160516154800

## 2015-05-04 LAB — CUP PACEART REMOTE DEVICE CHECK: Date Time Interrogation Session: 20160531165307

## 2015-05-05 ENCOUNTER — Encounter: Payer: Self-pay | Admitting: Internal Medicine

## 2015-05-10 ENCOUNTER — Telehealth: Payer: Self-pay | Admitting: Internal Medicine

## 2015-05-10 ENCOUNTER — Ambulatory Visit (INDEPENDENT_AMBULATORY_CARE_PROVIDER_SITE_OTHER): Payer: 59 | Admitting: Internal Medicine

## 2015-05-10 ENCOUNTER — Encounter: Payer: Self-pay | Admitting: Internal Medicine

## 2015-05-10 VITALS — BP 128/80 | HR 84 | Ht 59.0 in | Wt 145.0 lb

## 2015-05-10 DIAGNOSIS — J328 Other chronic sinusitis: Secondary | ICD-10-CM | POA: Diagnosis not present

## 2015-05-10 DIAGNOSIS — J4530 Mild persistent asthma, uncomplicated: Secondary | ICD-10-CM

## 2015-05-10 MED ORDER — AMOXICILLIN-POT CLAVULANATE 875-125 MG PO TABS: 1.0000 | ORAL_TABLET | Freq: Two times a day (BID) | ORAL | Status: DC

## 2015-05-10 MED ORDER — DM-GUAIFENESIN ER 30-600 MG PO TB12: ORAL_TABLET | ORAL | Status: DC

## 2015-05-10 MED ORDER — PREDNISONE 10 MG PO TABS: ORAL_TABLET | ORAL | Status: DC

## 2015-05-10 NOTE — Telephone Encounter (Signed)
Pt will be seen at 1:30pm by MW.

## 2015-05-10 NOTE — Progress Notes (Signed)
Subjective:    Patient ID: Andrea Mason, female    DOB: 01/05/1951, 64 y.o.   MRN: [id number]  HPI    Brief patient profile:  64yobf never smoker followed by Dr Loretha Brasil for allergic rhinitis and asthma   History of Present Illness  02/05/2013 1st pulmonary eval in EPIC era baseline = completely better on qvar 80 2bid and only use rescue once a week at baseline then much worse starting 3/3 with cough/ congestion/ green mucus and rescue x one by time of ov at 2pm.  No resting sob, very hoarse with harsh barking cough >Augmentin rx    DATE OF ADMISSION: 11/25/2013  DATE OF DISCHARGE: 12/10/2013   DISCHARGE DIAGNOSES:  1. Embolic left middle cerebral artery infarction.  2. Subcutaneous Lovenox for deep venous thrombosis prophylaxis.  3. Dysphagia.  4. Hypertension.  5. Acute sinusitis, resolved.  6. Hyperlipidemia.  7. Gastroesophageal reflux disease.  8. Asthma.   02/26/14 MW/ post hosp ov from [facility] doing well but confused again with meds, did not bring med calendar "they've changed my meds" no need for saba on symbicort 80 2bid  rec Just use your symbicort if you have cough shortness or breath or wheeze  See [name] in 4  Weeks for med calendar      10/20/2014 f/u ov/Rajinder Mesick re: asthma/ chronic rhinitis/no longer has Financial planner Complaint  Patient presents with  . Acute Visit    Pt c/o nasal congestion for the past few months. She also c/o cough- esp when she lies down.     No need for saba rec Augmentin 875 mg take one pill twice daily  X 10 days - take at breakfast and supper with large glass of water.  It would help reduce the usual side effects (diarrhea and yeast infections) if you ate cultured yogurt at lunch.  If sinuses not better after this please see Dr [name] Please schedule a follow up visit in 3 months but call sooner if needed to see [name] with all med  in hand and calendar we reviewed today    12/11/2014   Acute ov/Crystalynn Mcinerney re: Jaynie Bream Teryl Lucy not  better with saba / no med calendar  Chief Complaint  Patient presents with  . Acute Visit    Pt c/o increased SOB for a little over a wk now. She c/o nasal congestion. She also c/o "bad wheezing at night"- wakes her up sometimes.   has not been back to Dr [name], says people come in and steal her papers for disability and med calendars so hasn't been able to use either set Afrin really helps but doesn't know how to use it.  >>augmentin x 20 d .   01/04/2015 Follow up : Asthma /Chronic Rhinitis  Patient returns for a 4 week follow-up Last visit. Patient with asthma exacerbation and sinusitis. She was treated with Augmentin 20 days. She's been recommended to follow-up with ENT and has an upcoming appointment. She says since last visit. She is starting to improve. She has decreased nasal congestion. Breathing has returned back to baseline. She denies flare cough or wheezing. She denies any hemoptysis, orthopnea, PND or leg swelling. >cont on current regimen  03/23/2015 Acute OV  Presents for an acute office visit.  Complains of 4 days of cough, congestion, green mucus, wheezing .  She denies any chest pain, orthopnea, PND or leg swelling Has been using Mucinex and flutter valve. Patient is currently working with health department for her  medications as she is working on Print production planner. She has not been able to go to the ENT due to this..  REC Prednisone taper over next week.  Zpack take as directed.  Mucinex DM Twice daily  As needed  Cough/congestion .  Fluids and rest .  Saline nasal rinses As needed       OV 04/05/2015 Ann Lions note:  Chief Complaint  Patient presents with  . Acute Visit    Pt saw TP on 4/19 and has not yet taken pred taper or zpak. Pt c/o hoarseness, prod cough green and white mucus. Pt denies CP/tightness and f/c/s.    - . Here acute visit. She saw nurse practitioner 2 weeks ago but then did not follow through with instructions of prednisone and  antibiotics. A few days ago got exposed to several cats at a potential employers place and also flowers. Yesterday she did sitting at her church at full voice but today she has lost her voice and is having increased cough and chest tightness with associated wheezing and extreme hoarse voice. In the Office she speaking with a low  squeaky voice. Symptoms are rated as severe but not severe enough to need admissioin. MDI not helping. Symptoms now ongoing x 2-3 days rec Unclear if you have asthma flareup or vocal cord flareup or both  - cat and flowers could have triggered it Please take prednisone 40 mg x1 day, then 30 mg x1 day, then 20 mg x1 day, then 10 mg x1 day, and then 5 mg x1 day and stop Please take Z pak    05/10/2015 acute  ov/Lidie Glade re: ashtma vs vcd/ did not bring calendar  Chief Complaint  Patient presents with  . Acute Visit    Pt c/o runny nose, PND, sinus congestion, sneezing, prod cough with light green mucus X2-3 days.     better p last rx with pred/ zpak per Ramaswamy  Has not returned to ent yet/ acutely worse x 3 days but not absence of sob and all started with recurrent nasal congestion   No obvious day to day or daytime variabilty or assoc   cp or chest tightness, subjective wheeze overt  hb symptoms. No unusual exp hx or h/o childhood pna/ asthma or knowledge of premature birth.  Sleeping ok without nocturnal  or early am exacerbation  of respiratory  c/o's or need for noct saba. Also denies any obvious fluctuation of symptoms with weather or environmental changes or other aggravating or alleviating factors except as outlined above   Current Medications, Allergies, Complete Past Medical History, Past Surgical History, Family History, and Social History were reviewed in [employer] record.  ROS  The following are not active complaints unless bolded sore throat, dysphagia, dental problems, itching, sneezing,  nasal congestion or excess/ purulent  secretions, ear ache,   fever, chills, sweats, unintended wt loss, pleuritic or exertional cp, hemoptysis,  orthopnea pnd or leg swelling, presyncope, palpitations, heartburn, abdominal pain, anorexia, nausea, vomiting, diarrhea  or change in bowel or urinary habits, change in stools or urine, dysuria,hematuria,  rash, arthralgias, visual complaints, headache, numbness weakness or ataxia or problems with walking or coordination,  change in mood/affect or memory.            Objective:  amb bf congestion cough/ nasal tone to voice  Wt Readings from Last 3 Encounters:  05/10/15 145 lb (65.772 kg)  04/05/15 147 lb (66.679 kg)  03/23/15 149 lb 9.6 oz (67.858 kg)  Vital signs reviewed  HEENT: nl dentition, turbinates, and orophanx. Nl external ear canals without cough reflex   NECK :  without JVD/Nodes/TM/ nl carotid upstrokes bilaterally   LUNGS: no acc muscle use, clear to A and P bilaterally without cough on insp or exp maneuvers   CV:  RRR  no s3 or murmur or increase in P2, no edema   ABD:  soft and nontender with nl excursion in the supine position. No bruits or organomegaly, bowel sounds nl  MS:  warm without deformities, calf tenderness, cyanosis or clubbing  SKIN: warm and dry without lesions    NEURO:  alert, approp, no deficits          Assessment & Plan:

## 2015-05-10 NOTE — Patient Instructions (Addendum)
Work on inhaler technique:  relax and gently blow all the way out then take a nice smooth deep breath back in, triggering the inhaler at same time you start breathing in.  Hold for up to 5 seconds if you can.  Blow out thru your nose. Rinse and gargle with water when done   Augmentin 875 mg take one pill twice daily  X 10 days - take at breakfast and supper with large glass of water.  It would help reduce the usual side effects (diarrhea and yeast infections) if you ate cultured yogurt at lunch.   Prednisone 10 mg take  4 each am x 2 days,   2 each am x 2 days,  1 each am x 2 days and stop   See calendar for specific medication instructions and bring it back for each and every office visit for every healthcare provider you see.  Without it,  you may not receive the best quality medical care that we feel you deserve.  You will note that the calendar groups together  your maintenance  medications that are timed at particular times of the day.  Think of this as your checklist for what your doctor has instructed you to do until your next evaluation to see what benefit  there is  to staying on a consistent group of medications intended to keep you well.  The other group at the bottom is entirely up to you to use as you see fit  for specific symptoms that may arise between visits that require you to treat them on an as needed basis.  Think of this as your action plan or "what if" list.   Separating the top medications from the bottom group is fundamental to providing you adequate care going forward.    Keep appt to see me as scheduled already

## 2015-05-11 ENCOUNTER — Ambulatory Visit: Payer: 59 | Admitting: Pulmonary Disease

## 2015-05-14 ENCOUNTER — Ambulatory Visit (INDEPENDENT_AMBULATORY_CARE_PROVIDER_SITE_OTHER): Payer: 59 | Admitting: *Deleted

## 2015-05-14 DIAGNOSIS — I6330 Cerebral infarction due to thrombosis of unspecified cerebral artery: Secondary | ICD-10-CM

## 2015-05-17 ENCOUNTER — Encounter: Payer: Self-pay | Admitting: Internal Medicine

## 2015-05-17 NOTE — Assessment & Plan Note (Signed)
Acute flare> try Augmentin 875 mg take one pill twice daily  X 10 days /Prednisone 10 mg take  4 each am x 2 days,   2 each am x 2 days,  1 each am x 2 days and stop   Needs sinus ct and or ent eval not completely better

## 2015-05-17 NOTE — Progress Notes (Signed)
Loop recorder 

## 2015-05-17 NOTE — Assessment & Plan Note (Signed)
-  med calendar 03/17/2013 > not using 10/24/2013 , 04/07/2014  > did not bring to clinic 10/20/14  Or 12/11/2014  - 12/11/2014 p extensive coaching HFA effectiveness =    75%  - Spirometry 12/11/2014  Min abn mid flows only   Minimal flare despite acute rhinitis / sinusitis but she is on a complex medical regimen with specific action plans written out and continues to fail to use it as it as instructed or bring it to each ov  I had an extended discussion with the patient reviewing all relevant studies completed to date and  lasting 15 to 20 minutes of a 25 minute visit on the following ongoing concerns:    1) Each maintenance medication was reviewed in detail including most importantly the difference between maintenance and as needed and under what circumstances the prns are to be used. This was done in the context of a medication calendar review which provided the patient with a user-friendly unambiguous mechanism for medication administration and reconciliation and provides an action plan for all active problems. It is critical that this be shown to every doctor  for modification during the office visit if necessary so the patient can use it as a working document.     2) The proper method of use, as well as anticipated side effects, of a metered-dose inhaler are discussed and demonstrated to the patient. Improved effectiveness after extensive coaching during this visit to a level of approximately  75%

## 2015-05-18 LAB — CUP PACEART REMOTE DEVICE CHECK: MDC IDC SESS DTM: 20160614100538

## 2015-05-19 ENCOUNTER — Encounter: Payer: Self-pay | Admitting: Internal Medicine

## 2015-05-25 ENCOUNTER — Ambulatory Visit: Payer: Self-pay | Admitting: Internal Medicine

## 2015-05-25 ENCOUNTER — Encounter (HOSPITAL_COMMUNITY): Payer: Self-pay

## 2015-05-25 ENCOUNTER — Emergency Department (HOSPITAL_COMMUNITY): Payer: 59

## 2015-05-25 ENCOUNTER — Emergency Department (HOSPITAL_COMMUNITY)
Admission: EM | Admit: 2015-05-25 | Discharge: 2015-05-25 | Disposition: A | Discharge status: Home / Self Care | Payer: 59 | Attending: Emergency Medicine | Admitting: Emergency Medicine

## 2015-05-25 DIAGNOSIS — K219 Gastro-esophageal reflux disease without esophagitis: Secondary | ICD-10-CM | POA: Diagnosis not present

## 2015-05-25 DIAGNOSIS — Z86010 Personal history of colonic polyps: Secondary | ICD-10-CM | POA: Diagnosis not present

## 2015-05-25 DIAGNOSIS — Z8673 Personal history of transient ischemic attack (TIA), and cerebral infarction without residual deficits: Secondary | ICD-10-CM | POA: Insufficient documentation

## 2015-05-25 DIAGNOSIS — Z7982 Long term (current) use of aspirin: Secondary | ICD-10-CM | POA: Insufficient documentation

## 2015-05-25 DIAGNOSIS — R059 Cough, unspecified: Secondary | ICD-10-CM

## 2015-05-25 DIAGNOSIS — F329 Major depressive disorder, single episode, unspecified: Secondary | ICD-10-CM | POA: Insufficient documentation

## 2015-05-25 DIAGNOSIS — J45909 Unspecified asthma, uncomplicated: Secondary | ICD-10-CM | POA: Diagnosis not present

## 2015-05-25 DIAGNOSIS — M79604 Pain in right leg: Secondary | ICD-10-CM | POA: Insufficient documentation

## 2015-05-25 DIAGNOSIS — I10 Essential (primary) hypertension: Secondary | ICD-10-CM | POA: Diagnosis not present

## 2015-05-25 DIAGNOSIS — R42 Dizziness and giddiness: Secondary | ICD-10-CM | POA: Diagnosis present

## 2015-05-25 DIAGNOSIS — Z8639 Personal history of other endocrine, nutritional and metabolic disease: Secondary | ICD-10-CM | POA: Insufficient documentation

## 2015-05-25 DIAGNOSIS — R05 Cough: Secondary | ICD-10-CM

## 2015-05-25 DIAGNOSIS — R531 Weakness: Secondary | ICD-10-CM | POA: Insufficient documentation

## 2015-05-25 DIAGNOSIS — Z79899 Other long term (current) drug therapy: Secondary | ICD-10-CM | POA: Insufficient documentation

## 2015-05-25 DIAGNOSIS — E669 Obesity, unspecified: Secondary | ICD-10-CM | POA: Insufficient documentation

## 2015-05-25 LAB — COMPREHENSIVE METABOLIC PANEL
ALT: 16 U/L (ref 14–54)
AST: 18 U/L (ref 15–41)
Albumin: 4.1 g/dL (ref 3.5–5.0)
Alkaline Phosphatase: 82 U/L (ref 38–126)
Anion gap: 10 (ref 5–15)
BUN: 10 mg/dL (ref 6–20)
CALCIUM: 9.6 mg/dL (ref 8.9–10.3)
CHLORIDE: 104 mmol/L (ref 101–111)
CO2: 28 mmol/L (ref 22–32)
Creatinine, Ser: 0.74 mg/dL (ref 0.44–1.00)
GFR calc Af Amer: >60 mL/min (ref >60)
GFR calc non Af Amer: >60 mL/min (ref >60)
Glucose, Bld: 80 mg/dL (ref 65–99)
Potassium: 3.6 mmol/L (ref 3.5–5.1)
Sodium: 142 mmol/L (ref 135–145)
Total Bilirubin: 1.5 mg/dL — ABNORMAL HIGH (ref 0.3–1.2)
Total Protein: 7.7 g/dL (ref 6.5–8.1)

## 2015-05-25 LAB — RAPID URINE DRUG SCREEN, HOSP PERFORMED
AMPHETAMINES: NOT DETECTED
Barbiturates: NOT DETECTED
Benzodiazepines: NOT DETECTED
COCAINE: NOT DETECTED
Opiates: NOT DETECTED
TETRAHYDROCANNABINOL: NOT DETECTED

## 2015-05-25 LAB — URINALYSIS, ROUTINE W REFLEX MICROSCOPIC
Bilirubin Urine: NEGATIVE
Glucose, UA: NEGATIVE mg/dL
Hgb urine dipstick: NEGATIVE
Ketones, ur: NEGATIVE mg/dL
LEUKOCYTES UA: NEGATIVE
Nitrite: NEGATIVE
Protein, ur: NEGATIVE mg/dL
SPECIFIC GRAVITY, URINE: 1.005 (ref 1.005–1.030)
UROBILINOGEN UA: 0.2 mg/dL (ref 0.0–1.0)
pH: 7 (ref 5.0–8.0)

## 2015-05-25 LAB — DIFFERENTIAL
BASOS ABS: 0.1 10*3/uL (ref 0.0–0.1)
BASOS PCT: 1 % (ref 0–1)
Eosinophils Absolute: 1 10*3/uL — ABNORMAL HIGH (ref 0.0–0.7)
Eosinophils Relative: 17 % — ABNORMAL HIGH (ref 0–5)
LYMPHS PCT: 31 % (ref 12–46)
Lymphs Abs: 1.8 10*3/uL (ref 0.7–4.0)
MONO ABS: 0.2 10*3/uL (ref 0.1–1.0)
MONOS PCT: 4 % (ref 3–12)
NEUTROS ABS: 2.8 10*3/uL (ref 1.7–7.7)
NEUTROS PCT: 47 % (ref 43–77)

## 2015-05-25 LAB — CBC
HCT: 39.4 % (ref 36.0–46.0)
Hemoglobin: 13.3 g/dL (ref 12.0–15.0)
MCH: 30.3 pg (ref 26.0–34.0)
MCHC: 33.8 g/dL (ref 30.0–36.0)
MCV: 89.7 fL (ref 78.0–100.0)
Platelets: 174 10*3/uL (ref 150–400)
RBC: 4.39 MIL/uL (ref 3.87–5.11)
RDW: 12.5 % (ref 11.5–15.5)
WBC: 6 10*3/uL (ref 4.0–10.5)

## 2015-05-25 LAB — I-STAT TROPONIN, ED: Troponin i, poc: 0 ng/mL (ref 0.00–0.08)

## 2015-05-25 LAB — ETHANOL: Alcohol, Ethyl (B): <5 mg/dL (ref <5)

## 2015-05-25 LAB — PROTIME-INR
INR: 1.02 (ref 0.00–1.49)
Prothrombin Time: 13.7 seconds (ref 11.6–15.2)

## 2015-05-25 LAB — APTT: aPTT: 23 seconds — ABNORMAL LOW (ref 24–37)

## 2015-05-25 NOTE — ED Notes (Signed)
Per EMS- Patient c/o right "leg tightness". Patient told EMS it felt like a cramp in her leg.

## 2015-05-25 NOTE — Discharge Instructions (Signed)
Cough, Adult  A cough is a reflex that helps clear your throat and airways. It can help heal the body or may be a reaction to an irritated airway. A cough may only last 2 or 3 weeks (acute) or may last more than 8 weeks (chronic).  CAUSES Acute cough:  Viral or bacterial infections. Chronic cough:  Infections.  Allergies.  Asthma.  Post-nasal drip.  Smoking.  Heartburn or acid reflux.  Some medicines.  Chronic lung problems (COPD).  Cancer. SYMPTOMS   Cough.  Fever.  Chest pain.  Increased breathing rate.  High-pitched whistling sound when breathing (wheezing).  Colored mucus that you cough up (sputum). TREATMENT   A bacterial cough may be treated with antibiotic medicine.  A viral cough must run its course and will not respond to antibiotics.  Your caregiver may recommend other treatments if you have a chronic cough. HOME CARE INSTRUCTIONS   Only take over-the-counter or prescription medicines for pain, discomfort, or fever as directed by your caregiver. Use cough suppressants only as directed by your caregiver.  Use a cold steam vaporizer or humidifier in your bedroom or home to help loosen secretions.  Sleep in a semi-upright position if your cough is worse at night.  Rest as needed.  Stop smoking if you smoke. SEEK IMMEDIATE MEDICAL CARE IF:   You have pus in your sputum.  Your cough starts to worsen.  You cannot control your cough with suppressants and are losing sleep.  You begin coughing up blood.  You have difficulty breathing.  You develop pain which is getting worse or is uncontrolled with medicine.  You have a fever. MAKE SURE YOU:   Understand these instructions.  Will watch your condition.  Will get help right away if you are not doing well or get worse. Document Released: 05/19/2011 Document Revised: 02/12/2012 Document Reviewed: 05/19/2011 [facility] Patient Information 2015 [place], [place]. This information is not intended  to replace advice given to you by your health care provider. Make sure you discuss any questions you have with your health care provider. Weakness Weakness is a lack of strength. It may be felt all over the body (generalized) or in one specific part of the body (focal). Some causes of weakness can be serious. You may need further medical evaluation, especially if you are elderly or you have a history of immunosuppression (such as chemotherapy or HIV), kidney disease, heart disease, or diabetes. CAUSES  Weakness can be caused by many different things, including:  Infection.  Physical exhaustion.  Internal bleeding or other blood loss that results in a lack of red blood cells (anemia).  Dehydration. This cause is more common in elderly people.  Side effects or electrolyte abnormalities from medicines, such as pain medicines or sedatives.  Emotional distress, anxiety, or depression.  Circulation problems, especially severe peripheral arterial disease.  Heart disease, such as rapid atrial fibrillation, bradycardia, or heart failure.  Nervous system disorders, such as Guillain-Barr syndrome, multiple sclerosis, or stroke. DIAGNOSIS  To find the cause of your weakness, your caregiver will take your history and perform a physical exam. Lab tests or X-rays may also be ordered, if needed. TREATMENT  Treatment of weakness depends on the cause of your symptoms and can vary greatly. HOME CARE INSTRUCTIONS   Rest as needed.  Eat a well-balanced diet.  Try to get some exercise every day.  Only take over-the-counter or prescription medicines as directed by your caregiver. SEEK MEDICAL CARE IF:   Your weakness seems to  be getting worse or spreads to other parts of your body.  You develop new aches or pains. SEEK IMMEDIATE MEDICAL CARE IF:   You cannot perform your normal daily activities, such as getting dressed and feeding yourself.  You cannot walk up and down stairs, or you feel  exhausted when you do so.  You have shortness of breath or chest pain.  You have difficulty moving parts of your body.  You have weakness in only one area of the body or on only one side of the body.  You have a fever.  You have trouble speaking or swallowing.  You cannot control your bladder or bowel movements.  You have black or bloody vomit or stools. MAKE SURE YOU:  Understand these instructions.  Will watch your condition.  Will get help right away if you are not doing well or get worse. Document Released: 11/20/2005 Document Revised: 05/21/2012 Document Reviewed: 01/19/2012 [facility] Patient Information 2015 [place], [place]. This information is not intended to replace advice given to you by your health care provider. Make sure you discuss any questions you have with your health care provider.

## 2015-05-25 NOTE — ED Notes (Signed)
Patient transported to X-ray 

## 2015-05-25 NOTE — ED Notes (Signed)
Bed: WA17 Expected date: 05/25/15 Expected time:  Means of arrival:  Comments: Ems right leg numbness

## 2015-05-25 NOTE — ED Notes (Signed)
Unable to collect labs at this time patient is going to xray

## 2015-05-25 NOTE — ED Provider Notes (Signed)
CSN: [id number]     Arrival date & time 05/25/15  1435 History   First MD Initiated Contact with Patient 05/25/15 1506     Chief Complaint  Patient presents with  . Leg Pain  . Dizziness   HPI Comments: Patient was at home when she noticed symptoms that started sometime after 12:30 based on the TV shows she was watching. Patient felt like her right leg started to cramp and draw up. She had difficulty with walking. She felt some numbness involving the right leg and right arm. She started to feel dizzy and lightheaded at the same time. Patient call 911 and was brought into the emergency room. Since that time the symptoms have mostly resolved. However now she has started coughing.  Patient is a 64 y.o. female presenting with neurologic complaint. The history is provided by the patient.  Neurologic Problem This is a new problem. Episode onset: 2.5 to 3 hours ago. The problem occurs constantly. The problem has been rapidly improving. Pertinent negatives include no chest pain, no abdominal pain, no headaches and no shortness of breath. Nothing aggravates the symptoms. Nothing relieves the symptoms. She has tried nothing for the symptoms.    Past Medical History  Diagnosis Date  . Hypertension   . GERD (gastroesophageal reflux disease)   . Allergic rhinitis   . Asthma   . Obesity   . Depression   . History of colonic polyps   . DJD (degenerative joint disease), cervical   . 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 Stroke    Past Surgical History  Procedure Laterality Date  . Nasal sinus surgery  07/2006    Dr. [name]  . Nasal polyp surgery  07/2006    x 2 with Dr. [name]  . Foot surgery  1998  . Tubal ligation    . 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 Colon cancer Brother   . Diabetes Sister    History  Substance Use Topics  . Smoking status:  Never Smoker   . Smokeless tobacco: Never Used  . Alcohol Use: No   OB History    No data available     Review of Systems  Respiratory: Negative for shortness of breath.   Cardiovascular: Negative for chest pain.  Gastrointestinal: Negative for abdominal pain.  Neurological: Negative for headaches.  All other systems reviewed and are negative.     Allergies  Tramadol; Ivp dye; and Promethazine-codeine  Home Medications   Prior to Admission medications   Medication Sig Start Date End Date Taking? Authorizing Provider  albuterol (PROVENTIL HFA;VENTOLIN HFA) 108 (90 BASE) MCG/ACT inhaler 1-2 puffs every 4-6 hours as needed for wheezing/shortness of breath   Yes Historical Provider, MD  amLODipine (NORVASC) 5 MG tablet Take 1 tablet (5 mg total) by mouth daily. 12/15/14  Yes Biagio Borg, MD  aspirin 81 MG tablet Take 324 mg by mouth daily.    Yes Historical Provider, MD  Biotin 5000 MCG CAPS Take 1 capsule by mouth daily.   Yes Historical Provider, MD  dextromethorphan-guaiFENesin [facility] DM) 30-600 MG per 12 hr tablet 1-2 every 12 hours as needed for cough 05/10/15  Yes Tanda Rockers, MD  esomeprazole (NEXIUM) 40 MG capsule Take 1 capsule (40 mg total) by mouth daily at 12 noon. 02/03/15  Yes Biagio Borg, MD  gabapentin (NEURONTIN)  100 MG capsule Take 100 mg by mouth 3 (three) times daily as needed (pain).    Yes Historical Provider, MD  mometasone (NASONEX) 50 MCG/ACT nasal spray Place 2 sprays into the nose 2 (two) times daily.   Yes Historical Provider, MD  mometasone-formoterol (DULERA) 100-5 MCG/ACT AERO Inhale 2 puffs into the lungs 2 (two) times daily.   Yes Historical Provider, MD  montelukast (SINGULAIR) 10 MG tablet TAKE 1 TABLET BY MOUTH AT BEDTIME 03/01/15  Yes Tanda Rockers, MD  oxymetazoline (AFRIN) 0.05 % nasal spray Place 2 sprays into both nostrils 2 (two) times daily as needed for congestion (for 5 days).    Yes Historical Provider, MD  ranitidine (ZANTAC) 150 MG  tablet Take 150 mg by mouth at bedtime.   Yes Historical Provider, MD  rosuvastatin (CRESTOR) 10 MG tablet Take 10 mg by mouth daily.   Yes Historical Provider, MD  sodium chloride (OCEAN) 0.65 % SOLN nasal spray Place 2 sprays into both nostrils as needed for congestion.   Yes Historical Provider, MD  spironolactone (ALDACTONE) 25 MG tablet Take 25 mg by mouth daily.   Yes Historical Provider, MD  amoxicillin-clavulanate (AUGMENTIN) 875-125 MG per tablet Take 1 tablet by mouth 2 (two) times daily. Patient not taking: Reported on 05/25/2015 05/10/15   Tanda Rockers, MD  fesoterodine (TOVIAZ) 4 MG TB24 tablet Take 1 tablet (4 mg total) by mouth daily. 04/16/15   Biagio Borg, MD  predniSONE (DELTASONE) 10 MG tablet Take  4 each am x 2 days,   2 each am x 2 days,  1 each am x 2 days and stop Patient not taking: Reported on 05/25/2015 05/10/15   Tanda Rockers, MD   BP 127/91 mmHg  Pulse 74  Temp(Src) 98.2 F (36.8 C) (Oral)  Resp 23  SpO2 95% Physical Exam  Constitutional: She is oriented to person, place, and time. She appears well-developed and well-nourished. No distress.  HENT:  Head: Normocephalic and atraumatic.  Right Ear: External ear normal.  Left Ear: External ear normal.  Mouth/Throat: Oropharynx is clear and moist.  Eyes: Conjunctivae are normal. Right eye exhibits no discharge. Left eye exhibits no discharge. No scleral icterus.  Neck: Neck supple. No tracheal deviation present.  Cardiovascular: Normal rate, regular rhythm and intact distal pulses.   Pulmonary/Chest: Effort normal and breath sounds normal. No stridor. No respiratory distress. She has no wheezes. She has no rales.  Abdominal: Soft. Bowel sounds are normal. She exhibits no distension. There is no tenderness. There is no rebound and no guarding.  Musculoskeletal: She exhibits no edema or tenderness.  Neurological: She is alert and oriented to person, place, and time. She has normal strength. No cranial nerve deficit  (no facial droop, extraocular movements intact, no slurred speech although dysarhtria noted with pronunciation of some words) or sensory deficit. She exhibits normal muscle tone. She displays no seizure activity. Coordination normal.  No pronator drift bilateral upper extrem, able to hold both legs off bed for 5 seconds, sensation intact in all extremities, no visual field cuts, no left or right sided neglect, normal finger-nose exam, no nystagmus noted (pt states her speech is normal for her)   Skin: Skin is warm and dry. No rash noted. She is not diaphoretic.  Psychiatric: She has a normal mood and affect.  Nursing note and vitals reviewed.   ED Course  Procedures (including critical care time) Labs Review Labs Reviewed  APTT - Abnormal; Notable for the following:  aPTT 23 (*)    All other components within normal limits  DIFFERENTIAL - Abnormal; Notable for the following:    Eosinophils Relative 17 (*)    Eosinophils Absolute 1.0 (*)    All other components within normal limits  COMPREHENSIVE METABOLIC PANEL - Abnormal; Notable for the following:    Total Bilirubin 1.5 (*)    All other components within normal limits  ETHANOL  PROTIME-INR  CBC  URINE RAPID DRUG SCREEN, HOSP PERFORMED  URINALYSIS, ROUTINE W REFLEX MICROSCOPIC (NOT AT [facility])  Randolm Idol, ED    Imaging Review Dg Chest 2 View  05/25/2015   CLINICAL DATA:  Productive cough and nasal congestion  EXAM: CHEST  2 VIEW  COMPARISON:  December 08, 2014  FINDINGS: Lungs are clear. Heart size and pulmonary vascularity are normal. No adenopathy. There is degenerative change in the thoracic spine. A loop recorder is present over the anterior left lower hemithorax.  IMPRESSION: No edema or consolidation.   Electronically Signed   By: Lowella Grip III M.D.   On: 05/25/2015 15:31   Ct Head Wo Contrast  05/25/2015   CLINICAL DATA:  RIGHT leg weakness and tightness for 1 day, dizziness, history hypertension, asthma  EXAM:  CT HEAD WITHOUT CONTRAST  TECHNIQUE: Contiguous axial images were obtained from the base of the skull through the vertex without intravenous contrast.  COMPARISON:  05/25/2014  FINDINGS: Normal ventricular morphology.  No midline shift or mass effect.  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  No intracranial hemorrhage, mass lesion or evidence acute infarction.  No extra-axial fluid collections.  Extensive mucosal thickening and opacification throughout paranasal sinuses though aeration has improved since previous exam.  No acute osseous findings.  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  No acute intracranial abnormalities.  Pan sinus disease.   Electronically Signed   By: Lavonia Dana M.D.   On: 05/25/2015 15:55     EKG Interpretation   Date/Time:  Tuesday May 25 2015 15:52:08 EDT Ventricular Rate:  69 PR Interval:  168 QRS Duration: 80 QT Interval:  380 QTC Calculation: 407 R Axis:   27 Text Interpretation:  Sinus rhythm Atrial premature complex , new since  last tracing Anteroseptal infarct, age indeterminate Baseline wander  Confirmed by Bintou Lafata  MD-J, Glenna Brunkow ([id number]) on 05/25/2015 4:13:11 PM      MDM   Final diagnoses:  Cough  Weakness    The patient's symptoms have all resolved. She has been able to walk around without any difficulty.  I was concerned about the possibility of TIA or stroke initially based on her complaints however they are somewhat vague and may also have just been a leg cramp.  She is having no difficulty walking in the ED.  Considered MRI but she has a loop recorder.  PT feels well now. Comfortable going home.  Warning signs and precautions discussed   Dorie Rank, MD 05/25/15 1727

## 2015-05-28 ENCOUNTER — Ambulatory Visit: Payer: 59 | Admitting: Internal Medicine

## 2015-05-29 ENCOUNTER — Encounter: Payer: Self-pay | Admitting: Internal Medicine

## 2015-05-29 ENCOUNTER — Ambulatory Visit (INDEPENDENT_AMBULATORY_CARE_PROVIDER_SITE_OTHER): Payer: 59 | Admitting: Internal Medicine

## 2015-05-29 VITALS — BP 110/70 | Temp 97.9°F | Ht 59.0 in | Wt 146.0 lb

## 2015-05-29 DIAGNOSIS — R252 Cramp and spasm: Secondary | ICD-10-CM

## 2015-05-29 DIAGNOSIS — M6240 Contracture of muscle, unspecified site: Secondary | ICD-10-CM

## 2015-05-29 DIAGNOSIS — Z8673 Personal history of transient ischemic attack (TIA), and cerebral infarction without residual deficits: Secondary | ICD-10-CM | POA: Diagnosis not present

## 2015-05-29 NOTE — Patient Instructions (Signed)
I would like you see the neurologist  Earlier  Than planned and will get Dr. Jenny Reichmann .  Or [place] to help arrange this .  If gets weakness or  More worrisome sx  .    Then seek emergent care  In the interim .  Your exam is reassuring  Today.  Sometimes if  Stroke  In past can get intermittent  Spasm at times.

## 2015-05-29 NOTE — Progress Notes (Signed)
Pre visit review using our clinic review tool, if applicable. No additional management support is needed unless otherwise documented below in the visit note.   Chief Complaint  Patient presents with  . right side tightness; less sensation    HPI: Patient comes in today for [facility] Saturday clinic for   problem evaluation. She has had 2 spells episodes onf right sided  Leg and arm " tightness " without weakness  Or  New numbness .  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  Was seen in ed 6 21   And  For this and neg ct scan   coudnlt do mri caue of implant ... Missed/forgot appt yesterday  In office cause  (There were other responsibilities  Daughter   And other has issues   Same as in ed , ) omes in cause tolde to see pcp  About next step evaluation Also had some stomach pain with all of this  She has cohrnic recurrent sinusitis sees dr  Melvyn Novas.   Appt with  [facility]  In July  ROS: See pertinent positives and negatives per HPI. Saw dr Raliegh Ip in rehab  In 03/05/13   Past Medical History  Diagnosis Date  . Hypertension   . GERD (gastroesophageal reflux disease)   . Allergic rhinitis   . Asthma   . Obesity   . Depression   . History of colonic polyps   . DJD (degenerative joint disease), cervical   . 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 Stroke     Family History  Problem Relation Age of Onset  . Colon cancer Brother   . Diabetes Sister     History   Social History  . Marital Status: Widowed    Spouse Name: N/A  . Number of Children: 3  . Years of Education: N/A   Occupational History  . cosmetic consultant     [employer]   Social History Main Topics  . Smoking status: Never Smoker   . Smokeless tobacco: Never Used  . Alcohol Use: No  . Drug Use: No  . Sexual Activity: Not Currently   Other Topics Concern  . 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   Patient lives alone.    Outpatient Prescriptions Prior to Visit  Medication Sig Dispense Refill  . albuterol (PROVENTIL HFA;VENTOLIN HFA) 108 (90 BASE) MCG/ACT inhaler 1-2 puffs every 4-6 hours as needed for wheezing/shortness of breath    . amLODipine (NORVASC) 5 MG tablet Take 1 tablet (5 mg total) by mouth daily. 90 tablet 3  . aspirin 81 MG tablet Take 324 mg by mouth daily.     . Biotin 5000 MCG CAPS Take 1 capsule by mouth daily.    Marland Kitchen dextromethorphan-guaiFENesin (MUCINEX DM) 30-600 MG per 12 hr tablet 1-2 every 12 hours as needed for cough 120 tablet 11  . esomeprazole (NEXIUM) 40 MG capsule Take 1 capsule (40 mg total) by mouth daily at 12 noon. 90 capsule 2  . fesoterodine (TOVIAZ) 4 MG TB24 tablet Take 1 tablet (4 mg total) by mouth daily. 90 tablet 3  . gabapentin (NEURONTIN) 100 MG capsule Take 100 mg by mouth 3 (three) times daily as needed (pain).     . mometasone (NASONEX) 50 MCG/ACT nasal spray  Place 2 sprays into the nose 2 (two) times daily.    . mometasone-formoterol (DULERA) 100-5 MCG/ACT AERO Inhale 2 puffs into the lungs 2 (two) times daily.    . montelukast (SINGULAIR) 10 MG tablet TAKE 1 TABLET BY MOUTH AT BEDTIME 30 tablet 0  . oxymetazoline (AFRIN) 0.05 % nasal spray Place 2 sprays into both nostrils 2 (two) times daily as needed for congestion (for 5 days).     . ranitidine (ZANTAC) 150 MG tablet Take 150 mg by mouth at bedtime.    . rosuvastatin (CRESTOR) 10 MG tablet Take 10 mg by mouth daily.    . sodium chloride (OCEAN) 0.65 % SOLN nasal spray Place 2 sprays into both nostrils as needed for congestion.    Marland Kitchen spironolactone (ALDACTONE) 25 MG tablet Take 25 mg by mouth daily.    Marland Kitchen amoxicillin-clavulanate (AUGMENTIN) 875-125 MG per tablet Take 1 tablet by mouth 2 (two) times daily. 20 tablet 0  . predniSONE (DELTASONE) 10 MG tablet Take  4 each am x 2 days,   2 each am x 2 days,  1 each am x 2 days and stop 14 tablet 0    No facility-administered medications prior to visit.     EXAM:  BP 110/70 mmHg  Temp(Src) 97.9 F (36.6 C) (Oral)  Ht 4\' 11"  (1.499 m)  Wt 146 lb (66.225 kg)  BMI 29.47 kg/m2  Body mass index is 29.47 kg/(m^2).  GENERAL: vitals reviewed and listed above, alert, oriented, appears well hydrated and in no acute distress non toxic  Verbal nl speech  HEENT: atraumatic, conjunctiva  clear, no obvious abnormalities on inspection of external nose and ears OP : no lesion edema or exudate  NECK: no obvious masses on inspection palpation  LUNGS: clear to auscultation bilaterally, no wheezes, rales or rhonchi,  CV: HRRR, no clubbing cyanosis or  peripheral edema nl cap refill  MS: moves all extremities without noticeable focal  Feels right leg is tight  No fasciculation Abnormality  Neuro strength upper ext is good and equat gait steady  Nl toe heel  PSYCH: pleasant and cooperative, no obvious depression or anxiety verbal  Lab Results  Component Value Date   WBC 6.0 05/25/2015   HGB 13.3 05/25/2015   HCT 39.4 05/25/2015   PLT 174 05/25/2015   GLUCOSE 80 05/25/2015   CHOL 105 12/02/2014   TRIG 40.0 12/02/2014   HDL 53.10 12/02/2014   LDLCALC 44 12/02/2014   ALT 16 05/25/2015   AST 18 05/25/2015   NA 142 05/25/2015   K 3.6 05/25/2015   CL 104 05/25/2015   CREATININE 0.74 05/25/2015   BUN 10 05/25/2015   CO2 28 05/25/2015   TSH 1.47 12/02/2014   INR 1.02 05/25/2015   HGBA1C 5.9 12/02/2014    ASSESSMENT AND PLAN:  Discussed the following assessment and plan:  Spasm  Intrinsic muscle tightness - right side by report  H/O: CVA (cerebrovascular accident)  reassuring exam  Remote hx of cva  Having  Tight feeling in area of prev insult but no obv weakness.   Should be seeing  pcp  But  i advise see neuro sooner than late July . Will flag dr Jenny Reichmann about getting in earlier   . If alarm features return to ed  Explained limitations of Saturday clinic   -Patient advised to return or  notify health care team  if symptoms worsen ,persist or new concerns arise.  Patient Instructions  I would like you see the  neurologist  Earlier  Than planned and will get Dr. Jenny Reichmann .  Or [place] to help arrange this .  If gets weakness or  More worrisome sx  .    Then seek emergent care  In the interim .  Your exam is reassuring  Today.  Sometimes if  Stroke  In past can get intermittent  Spasm at times.    Standley Brooking. Eily Louvier M.D.

## 2015-06-04 ENCOUNTER — Other Ambulatory Visit: Payer: Self-pay | Admitting: Internal Medicine

## 2015-06-14 ENCOUNTER — Ambulatory Visit (INDEPENDENT_AMBULATORY_CARE_PROVIDER_SITE_OTHER): Payer: 59 | Admitting: *Deleted

## 2015-06-14 DIAGNOSIS — I6330 Cerebral infarction due to thrombosis of unspecified cerebral artery: Secondary | ICD-10-CM | POA: Diagnosis not present

## 2015-06-15 NOTE — Progress Notes (Signed)
Loop recorder 

## 2015-06-22 ENCOUNTER — Telehealth: Payer: Self-pay | Admitting: Internal Medicine

## 2015-06-24 ENCOUNTER — Ambulatory Visit: Payer: Self-pay | Admitting: Neurology

## 2015-06-24 NOTE — Telephone Encounter (Signed)
Error

## 2015-07-12 LAB — CUP PACEART REMOTE DEVICE CHECK: Date Time Interrogation Session: 20160808142718

## 2015-07-13 ENCOUNTER — Ambulatory Visit (INDEPENDENT_AMBULATORY_CARE_PROVIDER_SITE_OTHER): Payer: 59 | Admitting: *Deleted

## 2015-07-13 DIAGNOSIS — I6330 Cerebral infarction due to thrombosis of unspecified cerebral artery: Secondary | ICD-10-CM | POA: Diagnosis not present

## 2015-07-15 NOTE — Progress Notes (Signed)
Loop recorder 

## 2015-07-19 LAB — CUP PACEART REMOTE DEVICE CHECK: MDC IDC SESS DTM: 20160815082345

## 2015-07-21 ENCOUNTER — Encounter: Payer: Self-pay | Admitting: Internal Medicine

## 2015-07-28 ENCOUNTER — Encounter: Payer: Self-pay | Admitting: Internal Medicine

## 2015-07-29 ENCOUNTER — Ambulatory Visit: Payer: 59 | Admitting: Internal Medicine

## 2015-08-05 ENCOUNTER — Ambulatory Visit (INDEPENDENT_AMBULATORY_CARE_PROVIDER_SITE_OTHER): Payer: 59 | Admitting: Neurology

## 2015-08-05 ENCOUNTER — Encounter: Payer: Self-pay | Admitting: Neurology

## 2015-08-05 VITALS — BP 118/79 | HR 80 | Ht 59.0 in | Wt 145.0 lb

## 2015-08-05 DIAGNOSIS — I6990 Unspecified sequelae of unspecified cerebrovascular disease: Secondary | ICD-10-CM | POA: Diagnosis not present

## 2015-08-05 MED ORDER — BACLOFEN 10 MG PO TABS: 5.0000 mg | ORAL_TABLET | Freq: Three times a day (TID) | ORAL | Status: DC

## 2015-08-05 NOTE — Patient Instructions (Signed)
I had a long d/w patient about her remote stroke, risk for recurrent stroke/TIAs, personally independently reviewed imaging studies and stroke evaluation results and answered questions.Continue aspirin 325 mg orally every day  for secondary stroke prevention and maintain strict control of hypertension with blood pressure goal below 130/90, diabetes with hemoglobin A1c goal below 6.5% and lipids with LDL cholesterol goal below 100 mg/dL. I also advised the patient to eat a healthy diet with plenty of whole grains, cereals, fruits and vegetables, exercise regularly and maintain ideal body weight .Restart baclofen 5 mg 3 times daily for her spasticity and gait. Patient was given a handicap parking sticker at her request. Greater than 50% of time during this 25 minute visit was spent on counseling and coordination of care. Followup in the future with me in  1year

## 2015-08-05 NOTE — Progress Notes (Signed)
[facility] [address] [place]. [place] [address] [phone]       OFFICE FOLLOW UP VISIT NOTE  [name] Date of Birth:  03-02-1951 Medical Record Number:  [id number]   Referring MD:  Cathlean Cower Reason for Referral:  stroke  HPI: 17 year African American lady who was admitted with sudden onset of confusion and right hand weakness on 11/22/13. She was initially thought to have a syncopal event but subsequent workup including an MRI scan showed a left insular cortex and parietal infarct. Patient was admitted to [facility] long hospital and seen by the neuro hospital this. MRA of the brain which I personally reviewed shows decrease MCA branches in the area of the stroke but no large vessel proximal occlusion. Transthoracic echo showed normal ejection fraction without cardiac source of embolism. Telemetry monitoring did not reveal any obvious cardiac arrhythmias. Patient was started on aspirin for stroke prevention. Carotid Dopplers were unremarkable. Lipid profile showed total cholesterol of 80, triglycerides 53, HDL 46 and LDL 23 mg percent. Hemoglobin A1c was borderline at 6.1. Patient was transferred to inpatient rehabilitation where she stayed for a few weeks and was discharged home. She's done well since discharge. She still getting outpatient speech therapy and will finish next week. She has regained most of her speech back though she has learned to speak slowly and did greatly. She still has some word finding difficulties and word hesitancy. She has residual numbness in the right hand and has trouble cooking food but is able to do most activities for herself and is quite independent. She states her blood pressure is well controlled and it is 128/82 today. She has no prior history of strokes or TIAs. She is primary hyper or posterior known as an and follows up with her nephrologist. She has no known history of atrial fibrillation , smoking, diabetes or significant cardiac  disease. She has remote childhood history of syncopal events but nothing in recent years. Interestingly she states that following her recent stroke her accent has changed to what sounds like a [place] accent and family members find this quite amusing. Update 08/05/2015 : She returns for follow-up after last visit more than a year ago. She convinced to do well without recurrent stroke or TIA symptoms. She was seen in the emergency room in June this year with right upper extremity pain which is thought to be radicular pain she also had chest pain and that time. She is taking gabapentin which seems to help her pain. She noticed some increased stiffness in the right leg after some physical activities in her hand while washing a washing machine. She was prescribed baclofen at last visit but for unclear reason she discontinued it but she does tell me that it seemed to help her. She continues to have some intermittent speech difficulties and changes in her accent. ROS:   14 system review of systems is positive for speech difficulty, insomnia, difficulty swallowing, skin sensitivity, allergies, snoring, wheezing, cough, shortness of breath, itching and occasional trouble swallowing  PMH:  Past Medical History  Diagnosis Date  . Hypertension   . GERD (gastroesophageal reflux disease)   . Allergic rhinitis   . Asthma   . Obesity   . Depression   . History of colonic polyps   . DJD (degenerative joint disease), cervical   . 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 Stroke     Social History:  Social History   Social History  . Marital Status: Widowed  Spouse Name: N/A  . Number of Children: 3  . Years of Education: N/A   Occupational History  . cosmetic consultant     [employer]   Social History Main Topics  . Smoking status: Never Smoker   . Smokeless tobacco: Never Used  . Alcohol Use: No  . Drug Use: No  . Sexual Activity: Not Currently   Other Topics Concern  . 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   Patient lives alone.    Medications:   Current Outpatient Prescriptions on File Prior to Visit  Medication Sig Dispense Refill  . albuterol (PROVENTIL HFA;VENTOLIN HFA) 108 (90 BASE) MCG/ACT inhaler 1-2 puffs every 4-6 hours as needed for wheezing/shortness of breath    . amLODipine (NORVASC) 5 MG tablet Take 1 tablet (5 mg total) by mouth daily. 90 tablet 3  . aspirin 81 MG tablet Take 325 mg by mouth daily.     . Biotin 5000 MCG CAPS Take 1 capsule by mouth daily.    Marland Kitchen dextromethorphan-guaiFENesin (MUCINEX DM) 30-600 MG per 12 hr tablet 1-2 every 12 hours as needed for cough 120 tablet 11  . esomeprazole (NEXIUM) 40 MG capsule Take 1 capsule (40 mg total) by mouth daily at 12 noon. 90 capsule 2  . fesoterodine (TOVIAZ) 4 MG TB24 tablet Take 1 tablet (4 mg total) by mouth daily. 90 tablet 3  . gabapentin (NEURONTIN) 100 MG capsule Take 100 mg by mouth 3 (three) times daily as needed (pain).     . mometasone (NASONEX) 50 MCG/ACT nasal spray Place 2 sprays into the nose 2 (two) times daily.    . mometasone-formoterol (DULERA) 100-5 MCG/ACT AERO Inhale 2 puffs into the lungs 2 (two) times daily.    . montelukast (SINGULAIR) 10 MG tablet TAKE 1 TABLET BY MOUTH AT BEDTIME 30 tablet 5  . oxymetazoline (AFRIN) 0.05 % nasal spray Place 2 sprays into both nostrils 2 (two) times daily as needed for congestion (for 5 days).     . ranitidine (ZANTAC) 150 MG tablet Take 150 mg by mouth at bedtime.    . rosuvastatin (CRESTOR) 10 MG tablet Take 10 mg by mouth daily.    . sodium chloride (OCEAN) 0.65 % SOLN nasal spray Place 2 sprays into both nostrils as needed for congestion.    Marland Kitchen spironolactone (ALDACTONE) 25 MG tablet Take 25 mg by mouth daily.     No current facility-administered medications on file prior to visit.    Allergies:   Allergies    Allergen Reactions  . Tramadol Other (See Comments)    Almost passed out  . Ivp Dye [Iodinated Diagnostic Agents] Nausea And Vomiting and Other (See Comments)    Reaction: hot flashes  . Promethazine-Codeine Nausea And Vomiting    Physical Exam General: middle aged obese african american lady, seated, in no evident distress Head: head normocephalic and atraumatic.   Neck: supple with no carotid or supraclavicular bruits Cardiovascular: regular rate and rhythm, no murmurs Musculoskeletal: no deformity Skin:  no rash/petichiae Vascular:  Normal pulses all extremities Filed Vitals:   08/05/15 1100  BP: 118/79  Pulse: 80    Neurologic Exam Mental Status: Awake and fully alert. Oriented to place and time. Recent and remote memory intact. Attention span, concentration and fund of knowledge appropriate. Mood and affect appropriate.  Mild expressive aphasia with word finding difficulties and some disfluency. Able to speak sentences. Diminished naming animals 4 only. Good comprehension, repetition. Has foreign language accent which is [place] Cranial Nerves: Fundoscopic exam not done.  . Pupils equal, briskly reactive to light. Extraocular movements full without nystagmus. Visual fields full to confrontation. Hearing intact. Facial sensation intact. Face, tongue, palate moves normally and symmetrically.  Motor: Normal bulk and tone. Normal strength in all tested extremity muscles. Fine finger movements slightly diminished in the right hand. Sensory.: intact to touch and pinprick and vibratory sensation. Slight diminished touch pinprick sensation in the right hand alone. Coordination: Rapid alternating movements normal in all extremities. Finger-to-nose and heel-to-shin performed accurately bilaterally. Gait and Station: Arises from chair without difficulty. Stance is normal. Gait demonstrates normal stride length and balance . Able to heel, toe and tandem walk without difficulty.  Reflexes: 1+  and symmetric. Toes downgoing.   NIHSS  2 Modified Rankin  1   ASSESSMENT: 75 year African American lady with a left MCA branch infarct in December 1856 of embolic in etiology without definite identified source. Vascular risk factors of hypertension and mild obesity alone.    PLAN: I had a long d/w patient about her remote stroke, risk for recurrent stroke/TIAs, personally independently reviewed imaging studies and stroke evaluation results and answered questions.Continue aspirin 325 mg orally every day  for secondary stroke prevention and maintain strict control of hypertension with blood pressure goal below 130/90, diabetes with hemoglobin A1c goal below 6.5% and lipids with LDL cholesterol goal below 100 mg/dL. I also advised the patient to eat a healthy diet with plenty of whole grains, cereals, fruits and vegetables, exercise regularly and maintain ideal body weight .Restart baclofen 5 mg 3 times daily for her spasticity and gait. Patient was given a handicap parking sticker at her request. Greater than 50% of time during this 25 minute visit was spent on counseling and coordination of care. Followup in the future with me in  1year  Antony Contras, MD   Note: This document was prepared with digital dictation and possible smart phrase technology. Any transcriptional errors that result from this process are unintentional.

## 2015-08-12 ENCOUNTER — Ambulatory Visit (INDEPENDENT_AMBULATORY_CARE_PROVIDER_SITE_OTHER): Payer: 59 | Admitting: *Deleted

## 2015-08-12 DIAGNOSIS — I6330 Cerebral infarction due to thrombosis of unspecified cerebral artery: Secondary | ICD-10-CM

## 2015-08-16 NOTE — Progress Notes (Signed)
Loop recorder 

## 2015-08-25 ENCOUNTER — Emergency Department (HOSPITAL_COMMUNITY)
Admission: EM | Admit: 2015-08-25 | Discharge: 2015-08-25 | Disposition: A | Discharge status: Home / Self Care | Payer: 59 | Attending: Emergency Medicine | Admitting: Emergency Medicine

## 2015-08-25 DIAGNOSIS — T310 Burns involving less than 10% of body surface: Secondary | ICD-10-CM | POA: Diagnosis not present

## 2015-08-25 DIAGNOSIS — Z7982 Long term (current) use of aspirin: Secondary | ICD-10-CM | POA: Insufficient documentation

## 2015-08-25 DIAGNOSIS — F329 Major depressive disorder, single episode, unspecified: Secondary | ICD-10-CM | POA: Insufficient documentation

## 2015-08-25 DIAGNOSIS — Y998 Other external cause status: Secondary | ICD-10-CM | POA: Insufficient documentation

## 2015-08-25 DIAGNOSIS — M1990 Unspecified osteoarthritis, unspecified site: Secondary | ICD-10-CM | POA: Diagnosis not present

## 2015-08-25 DIAGNOSIS — I10 Essential (primary) hypertension: Secondary | ICD-10-CM | POA: Insufficient documentation

## 2015-08-25 DIAGNOSIS — Z8673 Personal history of transient ischemic attack (TIA), and cerebral infarction without residual deficits: Secondary | ICD-10-CM | POA: Insufficient documentation

## 2015-08-25 DIAGNOSIS — Z86010 Personal history of colonic polyps: Secondary | ICD-10-CM | POA: Insufficient documentation

## 2015-08-25 DIAGNOSIS — Y9289 Other specified places as the place of occurrence of the external cause: Secondary | ICD-10-CM | POA: Insufficient documentation

## 2015-08-25 DIAGNOSIS — Z23 Encounter for immunization: Secondary | ICD-10-CM | POA: Diagnosis not present

## 2015-08-25 DIAGNOSIS — Z7951 Long term (current) use of inhaled steroids: Secondary | ICD-10-CM | POA: Diagnosis not present

## 2015-08-25 DIAGNOSIS — J45909 Unspecified asthma, uncomplicated: Secondary | ICD-10-CM | POA: Diagnosis not present

## 2015-08-25 DIAGNOSIS — K219 Gastro-esophageal reflux disease without esophagitis: Secondary | ICD-10-CM | POA: Diagnosis not present

## 2015-08-25 DIAGNOSIS — Z79899 Other long term (current) drug therapy: Secondary | ICD-10-CM | POA: Insufficient documentation

## 2015-08-25 DIAGNOSIS — Y9389 Activity, other specified: Secondary | ICD-10-CM | POA: Diagnosis not present

## 2015-08-25 DIAGNOSIS — T2121XA Burn of second degree of chest wall, initial encounter: Secondary | ICD-10-CM | POA: Diagnosis not present

## 2015-08-25 DIAGNOSIS — X158XXA Contact with other hot household appliances, initial encounter: Secondary | ICD-10-CM | POA: Insufficient documentation

## 2015-08-25 DIAGNOSIS — E669 Obesity, unspecified: Secondary | ICD-10-CM | POA: Diagnosis not present

## 2015-08-25 MED ORDER — BACITRACIN ZINC 500 UNIT/GM EX OINT: 1.0000 "application " | TOPICAL_OINTMENT | Freq: Two times a day (BID) | CUTANEOUS | Status: DC

## 2015-08-25 MED ORDER — TETANUS-DIPHTH-ACELL PERTUSSIS 5-2.5-18.5 LF-MCG/0.5 IM SUSP
0.5000 mL | Freq: Once | INTRAMUSCULAR | Status: AC
  Administered 2015-08-25: 0.5 mL via INTRAMUSCULAR
  Filled 2015-08-25: qty 0.5

## 2015-08-25 NOTE — Discharge Instructions (Signed)
Apply bacitracin twice a day. Keep area covered if you are wearing a shirt that will lay over top of the burn area. Have your symptoms rechecked by your doctor to ensure proper healing.  Burn Care Your skin is a natural barrier to infection. It is the largest organ of your body. Burns damage this natural protection. To help prevent infection, it is very important to follow your caregiver's instructions in the care of your burn. Burns are classified as:  First degree. There is only redness of the skin (erythema). No scarring is expected.  Second degree. There is blistering of the skin. Scarring may occur with deeper burns.  Third degree. All layers of the skin are injured, and scarring is expected. HOME CARE INSTRUCTIONS   Wash your hands well before changing your bandage.  Change your bandage as often as directed by your caregiver.  Remove the old bandage. If the bandage sticks, you may soak it off with cool, clean water.  Cleanse the burn thoroughly but gently with mild soap and water.  Pat the area dry with a clean, dry cloth.  Apply a thin layer of antibacterial cream to the burn.  Apply a clean bandage as instructed by your caregiver.  Keep the bandage as clean and dry as possible.  Elevate the affected area for the first 24 hours, then as instructed by your caregiver.  Only take over-the-counter or prescription medicines for pain, discomfort, or fever as directed by your caregiver. SEEK IMMEDIATE MEDICAL CARE IF:   You develop excessive pain.  You develop redness, tenderness, swelling, or red streaks near the burn.  The burned area develops yellowish-white fluid (pus) or a bad smell.  You have a fever. MAKE SURE YOU:   Understand these instructions.  Will watch your condition.  Will get help right away if you are not doing well or get worse. Document Released: 11/20/2005 Document Revised: 02/12/2012 Document Reviewed: 04/12/2011 [facility] Patient Information  2015 [place], [place]. This information is not intended to replace advice given to you by your health care provider. Make sure you discuss any questions you have with your health care provider.
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
## 2015-08-30 LAB — CUP PACEART REMOTE DEVICE CHECK: MDC IDC SESS DTM: 20160908190954

## 2015-08-30 NOTE — Progress Notes (Signed)
Carelink summary report received. Battery status OK. Normal device function. No new symptom episodes, tachy episodes, brady, or pause episodes. No new AF episodes. Monthly summary reports and ROV with JA PRN. 

## 2015-09-06 ENCOUNTER — Encounter: Payer: Self-pay | Admitting: Internal Medicine

## 2015-09-06 ENCOUNTER — Telehealth: Payer: Self-pay | Admitting: Internal Medicine

## 2015-09-06 ENCOUNTER — Ambulatory Visit (INDEPENDENT_AMBULATORY_CARE_PROVIDER_SITE_OTHER): Payer: 59 | Admitting: Internal Medicine

## 2015-09-06 VITALS — BP 110/70 | HR 87 | Temp 98.3°F | Resp 16 | Ht 59.0 in | Wt 146.0 lb

## 2015-09-06 DIAGNOSIS — R05 Cough: Secondary | ICD-10-CM

## 2015-09-06 DIAGNOSIS — T300 Burn of unspecified body region, unspecified degree: Secondary | ICD-10-CM

## 2015-09-06 DIAGNOSIS — R059 Cough, unspecified: Secondary | ICD-10-CM

## 2015-09-06 MED ORDER — BENZONATATE 100 MG PO CAPS: 100.0000 mg | ORAL_CAPSULE | Freq: Two times a day (BID) | ORAL | Status: DC | PRN
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
## 2015-09-06 NOTE — Progress Notes (Signed)
Pre visit review using our clinic review tool, if applicable. No additional management support is needed unless otherwise documented below in the visit note. 

## 2015-09-06 NOTE — Telephone Encounter (Signed)
Spoke with pt. Pt c/o hoarseness and prod cough with yellow mucus. Pt states she would like to be seen today or tomorrow to address the hoarseness. Pt is in no acute distress. Pt has appt with MW on 10.4.2016. Advised pt to keep appt with MW. Pt verbalized understanding and denied any further questions or concerns at this time.

## 2015-09-06 NOTE — Assessment & Plan Note (Signed)
Initially 2nd degree and >50% healed at this time. Can keep using bacitracin on it. No indication for antibiotics.
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
## 2015-09-06 NOTE — Assessment & Plan Note (Signed)
Given tessalon perles for cough, has not been back to ENT. Using nasonex and affrin. Talked to her about sinus rinses. Lungs clear and no indication for antibiotics today.

## 2015-09-07 ENCOUNTER — Ambulatory Visit (INDEPENDENT_AMBULATORY_CARE_PROVIDER_SITE_OTHER): Payer: 59 | Admitting: Internal Medicine

## 2015-09-07 ENCOUNTER — Encounter: Payer: Self-pay | Admitting: Internal Medicine

## 2015-09-07 VITALS — BP 124/84 | HR 72 | Ht 59.0 in | Wt 147.6 lb

## 2015-09-07 DIAGNOSIS — J45991 Cough variant asthma: Secondary | ICD-10-CM

## 2015-09-07 MED ORDER — AMOXICILLIN-POT CLAVULANATE 875-125 MG PO TABS: 1.0000 | ORAL_TABLET | Freq: Two times a day (BID) | ORAL | Status: DC

## 2015-09-07 MED ORDER — PREDNISONE 10 MG PO TABS: ORAL_TABLET | ORAL | Status: DC

## 2015-09-07 MED ORDER — ALBUTEROL SULFATE HFA 108 (90 BASE) MCG/ACT IN AERS: 2.0000 | INHALATION_SPRAY | Freq: Four times a day (QID) | RESPIRATORY_TRACT | Status: DC | PRN

## 2015-09-07 NOTE — Patient Instructions (Signed)
Augmentin 875 mg take one pill twice daily  X 10 days - take at breakfast and supper with large glass of water.  It would help reduce the usual side effects (diarrhea and yeast infections) if you ate cultured yogurt at lunch.   Prednisone 10 mg take  4 each am x 2 days,   2 each am x 2 days,  1 each am x 2 days and stop   See [name] w/in 4 weeks with all your medications, even over the counter meds, separated in two separate bags, the ones you take no matter what vs the ones you stop once you feel better and take only as needed when you feel you need them.   [name]  will generate for you a new user friendly medication calendar that will put [place] all on the same page re: your medication use.     Without this process, it simply isn't possible to assure that we are providing  your outpatient care  with  the attention to detail we feel you deserve.   If we cannot assure that you're getting that kind of care,  then we cannot manage your problem effectively from this clinic.  Once you have seen [name] and we are sure that we're all on the same page with your medication use she will arrange follow up with me.

## 2015-09-07 NOTE — Progress Notes (Signed)
Subjective:    Patient ID: Andrea Mason, female    DOB: [birth year]  MRN: [id number]      Brief patient profile:  [id number] never smoker followed by Dr Loretha Brasil for allergic rhinitis and asthma   History of Present Illness  02/05/2013 1st pulmonary eval in EPIC era baseline = completely better on qvar 80 2bid and only use rescue once a week at baseline then much worse starting 3/3 with cough/ congestion/ green mucus and rescue x one by time of ov at 2pm.  No resting sob, very hoarse with harsh barking cough >Augmentin rx    DATE OF ADMISSION: 11/25/2013  DATE OF DISCHARGE: 12/10/2013   DISCHARGE DIAGNOSES:  1. Embolic left middle cerebral artery infarction.  2. Subcutaneous Lovenox for deep venous thrombosis prophylaxis.  3. Dysphagia.  4. Hypertension.  5. Acute sinusitis, resolved.  6. Hyperlipidemia.  7. Gastroesophageal reflux disease.  8. Asthma.    12/11/2014   Acute ov/Andrea Mason re: Andrea Mason not better with saba / no med calendar  Chief Complaint  Patient presents with  . Acute Visit    Pt c/o increased SOB for a little over a wk now. She c/o nasal congestion. She also c/o "bad wheezing at night"- wakes her up sometimes.   has not been back to Dr [name], says people come in and steal her papers for disability and med calendars so hasn't been able to use either set Afrin really helps but doesn't know how to use it.  >>augmentin x 20 d .    03/23/2015 Acute OV  Presents for an acute office visit.  Complains of 4 days of cough, congestion, green mucus, wheezing .  She denies any chest pain, orthopnea, PND or leg swelling Has been using Mucinex and flutter valve. 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 She has not been able to go to the ENT due to this..  REC Prednisone taper over next week.  Zpack take as directed.  Mucinex DM Twice daily  As needed  Cough/congestion .  Fluids and rest .    Saline nasal rinses As needed       OV 04/05/2015 Andrea Mason note:  Chief Complaint  Patient presents with  . Acute Visit    Pt saw TP on 4/19 and has not yet taken pred taper or zpak. Pt c/o hoarseness, prod cough green and white mucus. Pt denies CP/tightness and f/c/s.    - . Here acute visit. She saw nurse practitioner 2 weeks ago but then did not follow through with instructions of prednisone and antibiotics. A few days ago got exposed to several cats at a potential employers place and also flowers. Yesterday she did sitting at her church at full voice but today she has lost her voice and is having increased cough and chest tightness with associated wheezing and extreme hoarse voice. In the Office she speaking with a low  squeaky voice. Symptoms are rated as severe but not severe enough to need admissioin. MDI not helping. Symptoms now ongoing x 2-3 days rec Unclear if you have asthma flareup or vocal cord flareup or both  - cat and flowers could have triggered it Please take prednisone 40 mg x1 day, then 30 mg x1 day, then 20 mg x1 day, then 10 mg x1 day, and then 5 mg x1 day and stop Please take Z pak    05/10/2015 acute  ov/Andrea Mason re: ashtma vs vcd/ did  not bring calendar  Chief Complaint  Patient presents with  . Acute Visit    Pt c/o runny nose, PND, sinus congestion, sneezing, prod cough with light green mucus X2-3 days.    better p last rx with pred/ zpak per Ramaswamy  Has not returned to ent yet/ acutely worse x 3 days but not absence of sob and all started with recurrent nasal congestion  rec Work on inhaler technique:   Augmentin 875 mg take one pill twice daily  X 10 days - Prednisone 10 mg take  4 each am x 2 days,   2 each am x 2 days,  1 each am x 2 days and stop     09/07/2015 acute extended ov/Andrea Mason re: cough flare / no ent f/u yet - maint on dulera 100 2bid /singulair/ gerd rx and has med calendar but not using action plans  Chief Complaint  Patient presents with   . Acute Visit    pt states she has a prod cough thick and light in color and at times greenish for 2 weeks with hoarsness.   no c/o SOB or chest tightness.  pt states she did salt water guarggles  and was perscribed Tessalon  by her PCP yesterday.       No obvious day to day or daytime variabilty or assoc   cp or chest tightness, subjective wheeze overt  hb symptoms. No unusual exp hx or h/o childhood pna/ asthma or knowledge of premature birth.  Sleeping ok without nocturnal  or early am exacerbation  of respiratory  c/o's or need for noct saba. Also denies any obvious fluctuation of symptoms with weather or environmental changes or other aggravating or alleviating factors except as outlined above   Current Medications, Allergies, Complete Past Medical History, Past Surgical History, Family History, and Social History were reviewed in [employer] record.  ROS  The following are not active complaints unless bolded sore throat, dysphagia, dental problems, itching, sneezing,  nasal congestion or excess/ purulent secretions, ear ache,   fever, chills, sweats, unintended wt loss, pleuritic or exertional cp, hemoptysis,  orthopnea pnd or leg swelling, presyncope, palpitations, heartburn, abdominal pain, anorexia, nausea, vomiting, diarrhea  or change in bowel or urinary habits, change in stools or urine, dysuria,hematuria,  rash, arthralgias, visual complaints, headache, numbness weakness or ataxia or problems with walking or coordination,  change in mood/affect or memory.            Objective:  amb bf   nasal tone to voice/ hoarse / hacking  Dry sounding  cough    09/07/2015         148  Wt Readings from Last 3 Encounters:  05/10/15 145 lb (65.772 kg)  04/05/15 147 lb (66.679 kg)  03/23/15 149 lb 9.6 oz (67.858 kg)    Vital signs reviewed  HEENT: nl dentition, turbinates, and orophanx. Nl external ear canals without cough reflex   NECK :  without JVD/Nodes/TM/ nl  carotid upstrokes bilaterally   LUNGS: no acc muscle use, clear to A and P bilaterally without cough on insp or exp maneuvers   CV:  RRR  no s3 or murmur or increase in P2, no edema   ABD:  soft and nontender with nl excursion in the supine position. No bruits or organomegaly, bowel sounds nl  MS:  warm without deformities, calf tenderness, cyanosis or clubbing  SKIN: warm and dry without lesions    NEURO:  alert, approp, no deficits  I personally reviewed images and agree with radiology impression as follows:  CXR:  05/25/15 No edema or consolidation.        Assessment & Plan:

## 2015-09-08 ENCOUNTER — Encounter: Payer: Self-pay | Admitting: Internal Medicine

## 2015-09-08 NOTE — Assessment & Plan Note (Addendum)
-  med calendar 03/17/2013 > not using 10/24/2013 , 04/07/2014  > did not bring to clinic 10/20/14  Or 12/11/2014   - Spirometry 12/11/2014  Min abn mid flows only  - 09/07/2015  extensive coaching HFA effectiveness =    75% (Ti too short)   Mild flare in setting of poorly controlled rhinitis ? Recurrent sinusitis   rec Augmentin 875 mg take one pill twice daily  X 10 days - take at breakfast and supper with large glass of water.  It would help reduce the usual side effects (diarrhea and yeast infections) if you ate cultured yogurt at lunch. And Prednisone 10 mg take  4 each am x 2 days,   2 each am x 2 days,  1 each am x 2 days and stop and f/u with ENT when possible  I had an extended discussion with the patient reviewing all relevant studies completed to date and  lasting 15 to 20 minutes of a 25 minute visit    Each maintenance medication was reviewed in detail including most importantly the difference between maintenance and prns and under what circumstances the prns are to be triggered using an action plan format that is not reflected in the computer generated alphabetically organized AVS.  I emphasized using calendar which provided that she has all the information she needs to take care of herself is on the bottom of the sheet by this specific problem she is likely going to encounter. However, advised her that for any type of cough she encounters she needs to use flutter valve immediately to prevent airway trauma and cyclical coughing.   Please see instructions for details which were reviewed in writing and the patient given a copy highlighting the part that I personally wrote and discussed at today's ov.

## 2015-09-17 ENCOUNTER — Ambulatory Visit (INDEPENDENT_AMBULATORY_CARE_PROVIDER_SITE_OTHER): Payer: 59 | Admitting: Family

## 2015-09-17 ENCOUNTER — Encounter: Payer: Self-pay | Admitting: Family

## 2015-09-17 ENCOUNTER — Ambulatory Visit (INDEPENDENT_AMBULATORY_CARE_PROVIDER_SITE_OTHER)
Admission: RE | Admit: 2015-09-17 | Discharge: 2015-09-17 | Disposition: A | Discharge status: Home / Self Care | Payer: 59 | Source: Ambulatory Visit | Attending: Family | Admitting: Family

## 2015-09-17 VITALS — BP 124/90 | HR 102 | Temp 97.8°F | Resp 16 | Ht 59.0 in | Wt 148.0 lb

## 2015-09-17 DIAGNOSIS — M79601 Pain in right arm: Secondary | ICD-10-CM

## 2015-09-17 NOTE — Assessment & Plan Note (Signed)
Right pain of questionable origin and unable to reproduce symptoms during examination. No evidence of infection and doubt relation to tetanus shot. Cannot rule out related to previous stroke and neuropathic pain that comes and goes. Obtain x-ray to rule out calcific tendinitis or other structural abnormalities. Treat conservatively with heat, home exercise therapy, and Tylenol as needed for pain. Recommend follow-up with neurology for neuropathic pain. Referral to orthopedics or physical therapy pending x-ray results.

## 2015-09-17 NOTE — Progress Notes (Signed)
Subjective:    Patient ID: Andrea Mason, female    DOB: 06/07/1951, 64 y.o.   MRN: [id number]  Chief Complaint  Patient presents with  . Arm Pain    Right arm pain, states that she got a tetanus shot 3 weeks ago and since then has been having trouble with it, more the shoulder    HPI:  Andrea Mason is a 64 y.o. 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 and presents today for an acute office visit.   1.) Arm pain - Associated symptom of pain located in her right arm has been going on for about 3 weeks following a tetanus shot that was given in her right arm. Describes that she has difficulty with performing activities of daily living and lifting her arm above her head. Modifying factors include previously prescribed baclofen which did not help very much. Does have previous history of a stroke on the right side and has had right arm pain since then, but notes this pain is significantly increased compared to prior to the tetanus shot. Denies any additional trauma or sounds/sensations heard or felt.   Allergies  Allergen Reactions  . Tramadol Other (See Comments)    Almost passed out  . Ivp Dye [Iodinated Diagnostic Agents] Nausea And Vomiting and Other (See Comments)    Reaction: hot flashes  . Promethazine-Codeine Nausea And Vomiting     Current Outpatient Prescriptions on File Prior to Visit  Medication Sig Dispense Refill  . albuterol (PROAIR HFA) 108 (90 BASE) MCG/ACT inhaler Inhale 2 puffs into the lungs every 6 (six) hours as needed for wheezing or shortness of breath. 1 Inhaler 2  . albuterol (PROVENTIL HFA;VENTOLIN HFA) 108 (90 BASE) MCG/ACT inhaler 1-2 puffs every 4-6 hours as needed for wheezing/shortness of breath    . amLODipine (NORVASC) 5  MG tablet Take 1 tablet (5 mg total) by mouth daily. 90 tablet 3  . amoxicillin-clavulanate (AUGMENTIN) 875-125 MG tablet Take 1 tablet by mouth 2 (two) times daily. 20 tablet 0  . aspirin 81 MG tablet Take 325 mg by mouth daily.     . bacitracin ointment Apply 1 application topically 2 (two) times daily. 15 g 0  . baclofen (LIORESAL) 10 MG tablet Take 0.5 tablets (5 mg total) by mouth 3 (three) times daily. 30 each 0  . benzonatate (TESSALON) 100 MG capsule Take 1 capsule (100 mg total) by mouth 2 (two) times daily as needed for cough. 40 capsule 0  . Biotin 5000 MCG CAPS Take 1 capsule by mouth daily.    Marland Kitchen dextromethorphan-guaiFENesin (MUCINEX DM) 30-600 MG per 12 hr tablet 1-2 every 12 hours as needed for cough 120 tablet 11  . esomeprazole (NEXIUM) 40 MG capsule Take 1 capsule (40 mg total) by mouth daily at 12 noon. 90 capsule 2  . fesoterodine (TOVIAZ) 4 MG TB24 tablet Take 1 tablet (4 mg total) by mouth daily. 90 tablet 3  . gabapentin (NEURONTIN) 100 MG capsule Take 100 mg by mouth 3 (three) times daily as needed (pain).     . mometasone (NASONEX) 50 MCG/ACT nasal spray Place 2 sprays into the nose 2 (two) times daily.    . mometasone-formoterol (DULERA) 100-5 MCG/ACT AERO Inhale 2 puffs into the lungs 2 (two) times daily.    . montelukast (SINGULAIR) 10 MG tablet TAKE  1 TABLET BY MOUTH AT BEDTIME 30 tablet 5  . oxymetazoline (AFRIN) 0.05 % nasal spray Place 2 sprays into both nostrils 2 (two) times daily as needed for congestion (for 5 days).     . predniSONE (DELTASONE) 10 MG tablet Take  4 each am x 2 days,   2 each am x 2 days,  1 each am x 2 days and stop 14 tablet 0  . ranitidine (ZANTAC) 150 MG tablet Take 150 mg by mouth at bedtime.    . rosuvastatin (CRESTOR) 10 MG tablet Take 10 mg by mouth daily.    . sodium chloride (OCEAN) 0.65 % SOLN nasal spray Place 2 sprays into both nostrils as needed for congestion.    Marland Kitchen spironolactone (ALDACTONE) 25 MG tablet Take 25 mg by mouth daily.      No current facility-administered medications on file prior to visit.     Past Surgical History  Procedure Laterality Date  . Nasal sinus surgery  07/2006    Dr. [name]  . Nasal polyp surgery  07/2006    x 2 with Dr. [name]  . Foot surgery  1998  . Tubal ligation    . 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  Musculoskeletal:       Positive for right shoulder pain  Neurological: Negative for weakness and numbness.      Objective:    BP 124/90 mmHg  Pulse 102  Temp(Src) 97.8 F (36.6 C) (Oral)  Resp 16  Ht 4\' 11"  (1.499 m)  Wt 148 lb (67.132 kg)  BMI 29.88 kg/m2  SpO2 96% Nursing note and vital signs reviewed.  Physical Exam  Constitutional: She is oriented to person, place, and time. She appears well-developed and well-nourished. No distress.  Cardiovascular: Normal rate, regular rhythm, normal heart sounds and intact distal pulses.   Pulmonary/Chest: Effort normal and breath sounds normal.  Musculoskeletal:  Right shoulder/upper extremity - no obvious deformity, discoloration, or edema of right shoulder noted. No tenderness able to be elicited. Significantly decreased range of motion in flexion and abduction to approximately 130. Passive range of motion is also significantly decreased. Negative apprehension, negative impingement, negative empty can. Distal pulses, sensation, and reflexes are intact and appropriate.  Neurological: She is alert and oriented to person, place, and time.  Skin: Skin is warm and dry.  Psychiatric: She has a normal mood and affect. Her behavior is normal. Judgment and thought content normal.       Assessment & Plan:   Problem List Items Addressed This Visit      Other   Right arm pain - Primary    Right pain of questionable origin and unable to reproduce symptoms during  examination. No evidence of infection and doubt relation to tetanus shot. Cannot rule out related to previous stroke and neuropathic pain that comes and goes. Obtain x-ray to rule out calcific tendinitis or other structural abnormalities. Treat conservatively with heat, home exercise therapy, and Tylenol as needed for pain. Recommend follow-up with neurology for neuropathic pain. Referral to orthopedics or physical therapy pending x-ray results.      Relevant Orders   DG Shoulder Right

## 2015-09-17 NOTE — Patient Instructions (Addendum)
Thank you for choosing [facility].  Summary/Instructions:   Please stop by radiology on the basement level of the building for your x-rays. Your results will be released to [place] (or called to you) after review, usually within 72 hours after test completion. If any treatments or changes are necessary, you will be notified at that same time.  Referrals have been made during this visit. You should expect to hear back from our schedulers in about 7-10 days in regards to establishing an appointment with the specialists we discussed.   If your symptoms worsen or fail to improve, please contact our office for further instruction, or in case of emergency go directly to the emergency room at the closest medical facility.    TREATMENT  Treatment first involves the use of ice and medicine, to reduce pain and inflammation. The use of strengthening and stretching exercises may help reduce pain with activity. These exercises may be performed at home or with a therapist. If non-surgical treatment is unsuccessful after more than 6 months, surgery may be advised. After surgery and rehabilitation, activity is usually possible in 3 months.  MEDICATION  If pain medicine is needed, nonsteroidal anti-inflammatory medicines (aspirin and ibuprofen), or other minor pain relievers (acetaminophen), are often advised.  Do not take pain medicine for 7 days before surgery.  Prescription pain relievers may be given, if your caregiver thinks they are needed. Use only as directed and only as much as you need.  Corticosteroid injections may be given by your caregiver. These injections should be reserved for the most serious cases, because they may only be given a certain number of times. HEAT AND COLD  Cold treatment (icing) should be applied for 10 to 15 minutes every 2 to 3 hours for inflammation and pain, and immediately after activity that aggravates your symptoms. Use ice packs or an ice massage.  Heat  treatment may be used before performing stretching and strengthening activities prescribed by your caregiver, physical therapist, or athletic trainer. Use a heat pack or a warm water soak. SEEK MEDICAL CARE IF:   Symptoms get worse or do not improve in 4 to 6 weeks, despite treatment.  New, unexplained symptoms develop. (Drugs used in treatment may produce side effects.) EXERCISES  RANGE OF MOTION (ROM) AND STRETCHING EXERCISES - Impingement Syndrome (Rotator Cuff  Tendinitis, Bursitis) These exercises may help you when beginning to rehabilitate your injury. Your symptoms may go away with or without further involvement from your physician, physical therapist or athletic trainer. While completing these exercises, remember:   Restoring tissue flexibility helps normal motion to return to the joints. This allows healthier, less painful movement and activity.  An effective stretch should be held for at least 30 seconds.  A stretch should never be painful. You should only feel a gentle lengthening or release in the stretched tissue. STRETCH - Flexion, Standing  Stand with good posture. With an underhand grip on your right / left hand, and an overhand grip on the opposite hand, grasp a broomstick or cane so that your hands are a little more than shoulder width apart.  Keeping your right / left elbow straight and shoulder muscles relaxed, push the stick with your opposite hand, to raise your right / left arm in front of your body and then overhead. Raise your arm until you feel a stretch in your right / left shoulder, but before you have increased shoulder pain.  Try to avoid shrugging your right / left shoulder as your arm  rises, by keeping your shoulder blade tucked down and toward your mid-back spine. Hold for __________ seconds.  Slowly return to the starting position. Repeat __________ times. Complete this exercise __________ times per day. STRETCH - Abduction, Supine  Lie on your back. With  an underhand grip on your right / left hand and an overhand grip on the opposite hand, grasp a broomstick or cane so that your hands are a little more than shoulder width apart.  Keeping your right / left elbow straight and your shoulder muscles relaxed, push the stick with your opposite hand, to raise your right / left arm out to the side of your body and then overhead. Raise your arm until you feel a stretch in your right / left shoulder, but before you have increased shoulder pain.  Try to avoid shrugging your right / left shoulder as your arm rises, by keeping your shoulder blade tucked down and toward your mid-back spine. Hold for __________ seconds.  Slowly return to the starting position. Repeat __________ times. Complete this exercise __________ times per day. ROM - Flexion, Active-Assisted  Lie on your back. You may bend your knees for comfort.  Grasp a broomstick or cane so your hands are about shoulder width apart. Your right / left hand should grip the end of the stick, so that your hand is positioned "thumbs-up," as if you were about to shake hands.  Using your healthy arm to lead, raise your right / left arm overhead, until you feel a gentle stretch in your shoulder. Hold for __________ seconds.  Use the stick to assist in returning your right / left arm to its starting position. Repeat __________ times. Complete this exercise __________ times per day.  ROM - Internal Rotation, Supine   Lie on your back on a firm surface. Place your right / left elbow about 60 degrees away from your side. Elevate your elbow with a folded towel, so that the elbow and shoulder are the same height.  Using a broomstick or cane and your strong arm, pull your right / left hand toward your body until you feel a gentle stretch, but no increase in your shoulder pain. Keep your shoulder and elbow in place throughout the exercise.  Hold for __________ seconds. Slowly return to the starting  position. Repeat __________ times. Complete this exercise __________ times per day. STRETCH - Internal Rotation  Place your right / left hand behind your back, palm up.  Throw a towel or belt over your opposite shoulder. Grasp the towel with your right / left hand.  While keeping an upright posture, gently pull up on the towel, until you feel a stretch in the front of your right / left shoulder.  Avoid shrugging your right / left shoulder as your arm rises, by keeping your shoulder blade tucked down and toward your mid-back spine.  Hold for __________ seconds. Release the stretch, by lowering your healthy hand. Repeat __________ times. Complete this exercise __________ times per day. ROM - Internal Rotation   Using an underhand grip, grasp a stick behind your back with both hands.  While standing upright with good posture, slide the stick up your back until you feel a mild stretch in the front of your shoulder.  Hold for __________ seconds. Slowly return to your starting position. Repeat __________ times. Complete this exercise __________ times per day.  STRETCH - Posterior Shoulder Capsule   Stand or sit with good posture. Grasp your right / left elbow and draw  it across your chest, keeping it at the same height as your shoulder.  Pull your elbow, so your upper arm comes in closer to your chest. Pull until you feel a gentle stretch in the back of your shoulder.  Hold for __________ seconds. Repeat __________ times. Complete this exercise __________ times per day. STRENGTHENING EXERCISES - Impingement Syndrome (Rotator Cuff Tendinitis, Bursitis) These exercises may help you when beginning to rehabilitate your injury. They may resolve your symptoms with or without further involvement from your physician, physical therapist or athletic trainer. While completing these exercises, remember:  Muscles can gain both the endurance and the strength needed for everyday activities through  controlled exercises.  Complete these exercises as instructed by your physician, physical therapist or athletic trainer. Increase the resistance and repetitions only as guided.  You may experience muscle soreness or fatigue, but the pain or discomfort you are trying to eliminate should never worsen during these exercises. If this pain does get worse, stop and make sure you are following the directions exactly. If the pain is still present after adjustments, discontinue the exercise until you can discuss the trouble with your clinician.  During your recovery, avoid activity or exercises which involve actions that place your injured hand or elbow above your head or behind your back or head. These positions stress the tissues which you are trying to heal. STRENGTH - Scapular Depression and Adduction   With good posture, sit on a firm chair. Support your arms in front of you, with pillows, arm rests, or on a table top. Have your elbows in line with the sides of your body.  Gently draw your shoulder blades down and toward your mid-back spine. Gradually increase the tension, without tensing the muscles along the top of your shoulders and the back of your neck.  Hold for __________ seconds. Slowly release the tension and relax your muscles completely before starting the next repetition.  After you have practiced this exercise, remove the arm support and complete the exercise in standing as well as sitting position. Repeat __________ times. Complete this exercise __________ times per day.  STRENGTH - Shoulder Abductors, Isometric  With good posture, stand or sit about 4-6 inches from a wall, with your right / left side facing the wall.  Bend your right / left elbow. Gently press your right / left elbow into the wall. Increase the pressure gradually, until you are pressing as hard as you can, without shrugging your shoulder or increasing any shoulder discomfort.  Hold for __________ seconds.  Release  the tension slowly. Relax your shoulder muscles completely before you begin the next repetition. Repeat __________ times. Complete this exercise __________ times per day.  STRENGTH - External Rotators, Isometric  Keep your right / left elbow at your side and bend it 90 degrees.  Step into a door frame so that the outside of your right / left wrist can press against the door frame without your upper arm leaving your side.  Gently press your right / left wrist into the door frame, as if you were trying to swing the back of your hand away from your stomach. Gradually increase the tension, until you are pressing as hard as you can, without shrugging your shoulder or increasing any shoulder discomfort.  Hold for __________ seconds.  Release the tension slowly. Relax your shoulder muscles completely before you begin the next repetition. Repeat __________ times. Complete this exercise __________ times per day.  STRENGTH - Supraspinatus   Stand or  sit with good posture. Grasp a __________ weight, or an exercise band or tubing, so that your hand is "thumbs-up," like you are shaking hands.  Slowly lift your right / left arm in a "V" away from your thigh, diagonally into the space between your side and straight ahead. Lift your hand to shoulder height or as far as you can, without increasing any shoulder pain. At first, many people do not lift their hands above shoulder height.  Avoid shrugging your right / left shoulder as your arm rises, by keeping your shoulder blade tucked down and toward your mid-back spine.  Hold for __________ seconds. Control the descent of your hand, as you slowly return to your starting position. Repeat __________ times. Complete this exercise __________ times per day.  STRENGTH - External Rotators  Secure a rubber exercise band or tubing to a fixed object (table, pole) so that it is at the same height as your right / left elbow when you are standing or sitting on a firm  surface.  Stand or sit so that the secured exercise band is at your uninjured side.  Bend your right / left elbow 90 degrees. Place a folded towel or small pillow under your right / left arm, so that your elbow is a few inches away from your side.  Keeping the tension on the exercise band, pull it away from your body, as if pivoting on your elbow. Be sure to keep your body steady, so that the movement is coming only from your rotating shoulder.  Hold for __________ seconds. Release the tension in a controlled manner, as you return to the starting position. Repeat __________ times. Complete this exercise __________ times per day.  STRENGTH - Internal Rotators   Secure a rubber exercise band or tubing to a fixed object (table, pole) so that it is at the same height as your right / left elbow when you are standing or sitting on a firm surface.  Stand or sit so that the secured exercise band is at your right / left side.  Bend your elbow 90 degrees. Place a folded towel or small pillow under your right / left arm so that your elbow is a few inches away from your side.  Keeping the tension on the exercise band, pull it across your body, toward your stomach. Be sure to keep your body steady, so that the movement is coming only from your rotating shoulder.  Hold for __________ seconds. Release the tension in a controlled manner, as you return to the starting position. Repeat __________ times. Complete this exercise __________ times per day.  STRENGTH - Scapular Protractors, Standing   Stand arms length away from a wall. Place your hands on the wall, keeping your elbows straight.  Begin by dropping your shoulder blades down and toward your mid-back spine.  To strengthen your protractors, keep your shoulder blades down, but slide them forward on your rib cage. It will feel as if you are lifting the back of your rib cage away from the wall. This is a subtle motion and can be challenging to  complete. Ask your caregiver for further instruction, if you are not sure you are doing the exercise correctly.  Hold for __________ seconds. Slowly return to the starting position, resting the muscles completely before starting the next repetition. Repeat __________ times. Complete this exercise __________ times per day. STRENGTH - Scapular Protractors, Supine  Lie on your back on a firm surface. Extend your right / left arm  straight into the air while holding a __________ weight in your hand.  Keeping your head and back in place, lift your shoulder off the floor.  Hold for __________ seconds. Slowly return to the starting position, and allow your muscles to relax completely before starting the next repetition. Repeat __________ times. Complete this exercise __________ times per day. STRENGTH - Scapular Protractors, Quadruped  Get onto your hands and knees, with your shoulders directly over your hands (or as close as you can be, comfortably).  Keeping your elbows locked, lift the back of your rib cage up into your shoulder blades, so your mid-back rounds out. Keep your neck muscles relaxed.  Hold this position for __________ seconds. Slowly return to the starting position and allow your muscles to relax completely before starting the next repetition. Repeat __________ times. Complete this exercise __________ times per day.  STRENGTH - Scapular Retractors  Secure a rubber exercise band or tubing to a fixed object (table, pole), so that it is at the height of your shoulders when you are either standing, or sitting on a firm armless chair.  With a palm down grip, grasp an end of the band in each hand. Straighten your elbows and lift your hands straight in front of you, at shoulder height. Step back, away from the secured end of the band, until it becomes tense.  Squeezing your shoulder blades together, draw your elbows back toward your sides, as you bend them. Keep your upper arms lifted away  from your body throughout the exercise.  Hold for __________ seconds. Slowly ease the tension on the band, as you reverse the directions and return to the starting position. Repeat __________ times. Complete this exercise __________ times per day. STRENGTH - Shoulder Extensors   Secure a rubber exercise band or tubing to a fixed object (table, pole) so that it is at the height of your shoulders when you are either standing, or sitting on a firm armless chair.  With a thumbs-up grip, grasp an end of the band in each hand. Straighten your elbows and lift your hands straight in front of you, at shoulder height. Step back, away from the secured end of the band, until it becomes tense.  Squeezing your shoulder blades together, pull your hands down to the sides of your thighs. Do not allow your hands to go behind you.  Hold for __________ seconds. Slowly ease the tension on the band, as you reverse the directions and return to the starting position. Repeat __________ times. Complete this exercise __________ times per day.  STRENGTH - Scapular Retractors and External Rotators   Secure a rubber exercise band or tubing to a fixed object (table, pole) so that it is at the height as your shoulders, when you are either standing, or sitting on a firm armless chair.  With a palm down grip, grasp an end of the band in each hand. Bend your elbows 90 degrees and lift your elbows to shoulder height, at your sides. Step back, away from the secured end of the band, until it becomes tense.  Squeezing your shoulder blades together, rotate your shoulders so that your upper arms and elbows remain stationary, but your fists travel upward to head height.  Hold for __________ seconds. Slowly ease the tension on the band, as you reverse the directions and return to the starting position. Repeat __________ times. Complete this exercise __________ times per day.  STRENGTH - Scapular Retractors and External Rotators,  Rowing   Secure a  rubber exercise band or tubing to a fixed object (table, pole) so that it is at the height of your shoulders, when you are either standing, or sitting on a firm armless chair.  With a palm down grip, grasp an end of the band in each hand. Straighten your elbows and lift your hands straight in front of you, at shoulder height. Step back, away from the secured end of the band, until it becomes tense.  Step 1: Squeeze your shoulder blades together. Bending your elbows, draw your hands to your chest, as if you are rowing a boat. At the end of this motion, your hands and elbow should be at shoulder height and your elbows should be out to your sides.  Step 2: Rotate your shoulders, to raise your hands above your head. Your forearms should be vertical and your upper arms should be horizontal.  Hold for __________ seconds. Slowly ease the tension on the band, as you reverse the directions and return to the starting position. Repeat __________ times. Complete this exercise __________ times per day.  STRENGTH - Scapular Depressors  Find a sturdy chair without wheels, such as a dining room chair.  Keeping your feet on the floor, and your hands on the chair arms, lift your bottom up from the seat, and lock your elbows.  Keeping your elbows straight, allow gravity to pull your body weight down. Your shoulders will rise toward your ears.  Raise your body against gravity by drawing your shoulder blades down your back, shortening the distance between your shoulders and ears. Although your feet should always maintain contact with the floor, your feet should progressively support less body weight, as you get stronger.  Hold for __________ seconds. In a controlled and slow manner, lower your body weight to begin the next repetition. Repeat __________ times. Complete this exercise __________ times per day.    This information is not intended to replace advice given to you by your health care  provider. Make sure you discuss any questions you have with your health care provider.   Document Released: 11/20/2005 Document Revised: 12/11/2014 Document Reviewed: 03/04/2009 [employer]vier Interactive Patient Education [employer].

## 2015-09-17 NOTE — Progress Notes (Signed)
Pre visit review using our clinic review tool, if applicable. No additional management support is needed unless otherwise documented below in the visit note. 

## 2015-09-19 ENCOUNTER — Encounter: Payer: Self-pay | Admitting: Family

## 2015-09-27 ENCOUNTER — Encounter: Payer: Self-pay | Admitting: Internal Medicine

## 2015-09-30 ENCOUNTER — Encounter: Payer: Self-pay | Admitting: Cardiology

## 2015-10-05 ENCOUNTER — Ambulatory Visit (INDEPENDENT_AMBULATORY_CARE_PROVIDER_SITE_OTHER): Payer: 59 | Admitting: Adult Health

## 2015-10-05 ENCOUNTER — Encounter: Payer: Self-pay | Admitting: Adult Health

## 2015-10-05 VITALS — BP 124/86 | HR 79 | Temp 97.5°F | Ht 59.0 in | Wt 148.0 lb

## 2015-10-05 DIAGNOSIS — J328 Other chronic sinusitis: Secondary | ICD-10-CM

## 2015-10-05 DIAGNOSIS — J4530 Mild persistent asthma, uncomplicated: Secondary | ICD-10-CM

## 2015-10-05 DIAGNOSIS — Z23 Encounter for immunization: Secondary | ICD-10-CM | POA: Diagnosis not present

## 2015-10-05 NOTE — Assessment & Plan Note (Signed)
Recent flare with [facility] -improved  Patient's medications were reviewed today and patient education was given. Computerized medication calendar was adjusted/completed   Plan  Flu shot today  Follow med calendar closely and bring to each visit.  Follow up Dr. Melvyn Novas  In 3-4  months and As needed   Please contact office for sooner follow up if symptoms do not improve or worsen or seek emergency care

## 2015-10-05 NOTE — Assessment & Plan Note (Signed)
Recent flare now resolving   Plan   Flu shot today  Follow med calendar closely and bring to each visit.  Follow up Dr. Melvyn Novas  In 3-4  months and As needed   Please contact office for sooner follow up if symptoms do not improve or worsen or seek emergency care

## 2015-10-05 NOTE — Patient Instructions (Signed)
Flu shot today  Follow med calendar closely and bring to each visit.  Follow up Dr. Melvyn Novas  In 3-4  months and As needed   Please contact office for sooner follow up if symptoms do not improve or worsen or seek emergency care

## 2015-10-05 NOTE — Progress Notes (Signed)
Subjective:    Patient ID: Andrea Mason, female    DOB: June 06, 1951  MRN: [id number]      Brief patient profile:  10yobf never smoker followed by Andrea Andrea Mason for allergic rhinitis and asthma   History of Present Illness  02/05/2013 1st pulmonary eval in EPIC era baseline = completely better on qvar 80 2bid and only use rescue once a week at baseline then much worse starting 3/3 with cough/ congestion/ green mucus and rescue x one by time of ov at 2pm.  No resting sob, very hoarse with harsh barking cough >Augmentin rx    DATE OF ADMISSION: 11/25/2013  DATE OF DISCHARGE: 12/10/2013   DISCHARGE DIAGNOSES:  1. Embolic left middle cerebral artery infarction.  2. Subcutaneous Lovenox for deep venous thrombosis prophylaxis.  3. Dysphagia.  4. Hypertension.  5. Acute sinusitis, resolved.  6. Hyperlipidemia.  7. Gastroesophageal reflux disease.  8. Asthma.    12/11/2014   Acute ov/Wert re: Andrea Mason not better with saba / no med calendar  Chief Complaint  Patient presents with  . Acute Visit    Pt c/o increased SOB for a little over a wk now. She c/o nasal congestion. She also c/o "bad wheezing at night"- wakes her up sometimes.   has not been back to Andrea Mason, says people come in and steal her papers for disability and med calendars so hasn't been able to use either set Afrin really helps but doesn't know how to use it.  >>augmentin x 20 d .    03/23/2015 Acute OV  Presents for an acute office visit.  Complains of 4 days of cough, congestion, green mucus, wheezing .  She denies any chest pain, orthopnea, PND or leg swelling Has been using Mucinex and flutter valve. 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 She has not been able to go to the ENT due to this..  REC Prednisone taper over next week.  Zpack take as directed.  Mucinex DM Twice daily  As needed  Cough/congestion .  Fluids and rest .    Saline nasal rinses As needed       OV 04/05/2015 Andrea Mason note:  Chief Complaint  Patient presents with  . Acute Visit    Pt saw TP on 4/19 and has not yet taken pred taper or zpak. Pt c/o hoarseness, prod cough green and white mucus. Pt denies CP/tightness and f/c/s.    - . Here acute visit. She saw nurse practitioner 2 weeks ago but then did not follow through with instructions of prednisone and antibiotics. A few days ago got exposed to several cats at a potential employers place and also flowers. Yesterday she did sitting at her church at full voice but today she has lost her voice and is having increased cough and chest tightness with associated wheezing and extreme hoarse voice. In the Office she speaking with a low  squeaky voice. Symptoms are rated as severe but not severe enough to need admissioin. MDI not helping. Symptoms now ongoing x 2-3 days rec Unclear if you have asthma flareup or vocal cord flareup or both  - cat and flowers could have triggered it Please take prednisone 40 mg x1 day, then 30 mg x1 day, then 20 mg x1 day, then 10 mg x1 day, and then 5 mg x1 day and stop Please take Z pak    05/10/2015 acute  ov/Wert re: ashtma vs vcd/ did  not bring calendar  Chief Complaint  Patient presents with  . Acute Visit    Pt c/o runny nose, PND, sinus congestion, sneezing, prod cough with light green mucus X2-3 days.    better p last rx with pred/ zpak per Andrea Mason  Has not returned to ent yet/ acutely worse x 3 days but not absence of sob and all started with recurrent nasal congestion  rec Work on inhaler technique:   Augmentin 875 mg take one pill twice daily  X 10 days - Prednisone 10 mg take  4 each am x 2 days,   2 each am x 2 days,  1 each am x 2 days and stop     09/07/2015 acute extended ov/Wert re: cough flare / no ent f/u yet - maint on dulera 100 2bid /singulair/ gerd rx and has med calendar but not using action plans  Chief Complaint  Patient presents with   . Acute Visit    pt states she has a prod cough thick and light in color and at times greenish for 2 weeks with hoarsness.   no c/o SOB or chest tightness.  pt states she did salt water guarggles  and was perscribed Tessalon  by her PCP yesterday.   >>augmentin and pred taper   10/05/2015 Follow up : Cough    Pt returns for 1 month follow up .  Seen last ov with sinusitis and bronchiis , tx w/ augmentin and pred taper  She is feeling better. Nasal congestion and drainage seem to come and go with new season.  She does feel better. Ready for flu shot today .  We reviewed all her meds today with pt education .  She denies chest pain, orthopnea, edema, or fever.    Current Medications, Allergies, Complete Past Medical History, Past Surgical History, Family History, and Social History were reviewed in [employer] record.  ROS  The following are not active complaints unless bolded sore throat, dysphagia, dental problems, itching, sneezing,    ear ache,   fever, chills, sweats, unintended wt loss, pleuritic or exertional cp, hemoptysis,  orthopnea pnd or leg swelling, presyncope, palpitations, heartburn, abdominal pain, anorexia, nausea, vomiting, diarrhea  or change in bowel or urinary habits, change in stools or urine, dysuria,hematuria,  rash, arthralgias, visual complaints, headache, numbness weakness or ataxia or problems with walking or coordination,  change in mood/affect or memory.            Objective:  amb bf   nasal tone to voice/ hoarse     Vital signs reviewed  HEENT: nl dentition, turbinates, and orophanx. Nl external ear canals without cough reflex   NECK :  without JVD/Nodes/TM/ nl carotid upstrokes bilaterally   LUNGS: no acc muscle use, clear to A and P bilaterally without cough on insp or exp maneuvers   CV:  RRR  no s3 or murmur or increase in P2, no edema   ABD:  soft and nontender with nl excursion in the supine position. No bruits or  organomegaly, bowel sounds nl  MS:  warm without deformities, calf tenderness, cyanosis or clubbing  SKIN: warm and dry without lesions    NEURO:  alert, approp, no deficits      CXR:  05/25/15 No edema or consolidation.        Assessment & Plan:

## 2015-10-06 NOTE — Progress Notes (Signed)
Chart and office note reviewed in detail  > agree with a/p as outlined    

## 2015-10-08 ENCOUNTER — Encounter: Payer: Self-pay | Admitting: Cardiology

## 2015-10-08 NOTE — Addendum Note (Signed)
Addended by: Osa Craver on: 10/08/2015 12:49 PM   Modules accepted: Medications

## 2015-10-11 ENCOUNTER — Ambulatory Visit (INDEPENDENT_AMBULATORY_CARE_PROVIDER_SITE_OTHER): Payer: Self-pay | Admitting: *Deleted

## 2015-10-11 DIAGNOSIS — I6330 Cerebral infarction due to thrombosis of unspecified cerebral artery: Secondary | ICD-10-CM

## 2015-10-12 NOTE — Progress Notes (Signed)
Carelink Summary Report / Loop recorder 

## 2015-10-14 ENCOUNTER — Encounter: Payer: Self-pay | Admitting: Cardiology

## 2015-10-20 ENCOUNTER — Telehealth: Payer: Self-pay | Admitting: Internal Medicine

## 2015-10-20 NOTE — Telephone Encounter (Signed)
Called and spoke with pt Pt requesting samples of nasonex Informed pt that samples would be left up front for her to pick up Pt voiced understanding  Nothing further is needed

## 2015-10-21 ENCOUNTER — Encounter: Payer: Self-pay | Admitting: Cardiology

## 2015-10-29 LAB — CUP PACEART REMOTE DEVICE CHECK: Date Time Interrogation Session: 20161107201032

## 2015-11-10 ENCOUNTER — Ambulatory Visit (INDEPENDENT_AMBULATORY_CARE_PROVIDER_SITE_OTHER): Payer: Self-pay | Admitting: *Deleted

## 2015-11-10 DIAGNOSIS — I6330 Cerebral infarction due to thrombosis of unspecified cerebral artery: Secondary | ICD-10-CM

## 2015-11-11 NOTE — Progress Notes (Signed)
Carelink Summary Report / Loop Recorder 

## 2015-11-12 ENCOUNTER — Telehealth: Payer: Self-pay | Admitting: Internal Medicine

## 2015-11-12 ENCOUNTER — Other Ambulatory Visit: Payer: Self-pay

## 2015-11-12 MED ORDER — MOMETASONE FUROATE 50 MCG/ACT NA SUSP: 2.0000 | Freq: Two times a day (BID) | NASAL | Status: DC

## 2015-11-12 NOTE — Telephone Encounter (Signed)
Spoke with pt, requesting refill on Nasonex.  This has been sent to preferred pharmacy.  Nothing further needed.

## 2015-11-22 ENCOUNTER — Other Ambulatory Visit: Payer: Self-pay | Admitting: Internal Medicine

## 2015-11-22 MED ORDER — ALBUTEROL SULFATE HFA 108 (90 BASE) MCG/ACT IN AERS: 2.0000 | INHALATION_SPRAY | RESPIRATORY_TRACT | Status: DC | PRN

## 2015-12-10 ENCOUNTER — Ambulatory Visit (INDEPENDENT_AMBULATORY_CARE_PROVIDER_SITE_OTHER): Payer: Self-pay | Admitting: *Deleted

## 2015-12-10 DIAGNOSIS — I6330 Cerebral infarction due to thrombosis of unspecified cerebral artery: Secondary | ICD-10-CM

## 2015-12-14 NOTE — Progress Notes (Signed)
Carelink Summary Report / Loop Recorder 

## 2015-12-28 ENCOUNTER — Other Ambulatory Visit: Payer: Self-pay | Admitting: Internal Medicine

## 2015-12-28 MED ORDER — MOMETASONE FURO-FORMOTEROL FUM 100-5 MCG/ACT IN AERO: 2.0000 | INHALATION_SPRAY | Freq: Two times a day (BID) | RESPIRATORY_TRACT | Status: DC

## 2016-01-04 ENCOUNTER — Telehealth: Payer: Self-pay | Admitting: *Deleted

## 2016-01-04 LAB — CUP PACEART REMOTE DEVICE CHECK: MDC IDC SESS DTM: 20161207203946

## 2016-01-04 MED ORDER — ROSUVASTATIN CALCIUM 10 MG PO TABS: 10.0000 mg | ORAL_TABLET | ORAL | Status: DC

## 2016-01-04 NOTE — Telephone Encounter (Signed)
Left msg on triage stating need rx for crestor fax to [facility]. Also would like Toviaz & ditropan samples. callept back no answer LMOM rx for crestor was fax to [facility], and we do not receive samples anymore...Johny Chess

## 2016-01-05 ENCOUNTER — Other Ambulatory Visit: Payer: Self-pay | Admitting: Internal Medicine

## 2016-01-05 MED FILL — AMLODIPINE BESYLATE 5 MG TA: 5 | 90 days supply | Qty: 90 | Fill #0

## 2016-01-05 MED FILL — SPIRONOLACTONE 25 MG TABLET: 25 | 60 days supply | Qty: 60 | Fill #9

## 2016-01-07 ENCOUNTER — Ambulatory Visit (INDEPENDENT_AMBULATORY_CARE_PROVIDER_SITE_OTHER): Payer: Self-pay | Admitting: Internal Medicine

## 2016-01-07 ENCOUNTER — Encounter: Payer: Self-pay | Admitting: Internal Medicine

## 2016-01-07 VITALS — BP 120/76 | HR 80 | Ht 59.0 in | Wt 150.6 lb

## 2016-01-07 DIAGNOSIS — J328 Other chronic sinusitis: Secondary | ICD-10-CM

## 2016-01-07 DIAGNOSIS — J45991 Cough variant asthma: Secondary | ICD-10-CM

## 2016-01-07 NOTE — Progress Notes (Signed)
Subjective:    Patient ID: Andrea Mason, female    DOB: June 06, 1951  MRN: [id number]      Brief patient profile:  10yobf never smoker followed by Dr Loretha Brasil for allergic rhinitis and asthma   History of Present Illness  02/05/2013 1st pulmonary eval in EPIC era baseline = completely better on qvar 80 2bid and only use rescue once a week at baseline then much worse starting 3/3 with cough/ congestion/ green mucus and rescue x one by time of ov at 2pm.  No resting sob, very hoarse with harsh barking cough >Augmentin rx    DATE OF ADMISSION: 11/25/2013  DATE OF DISCHARGE: 12/10/2013   DISCHARGE DIAGNOSES:  1. Embolic left middle cerebral artery infarction.  2. Subcutaneous Lovenox for deep venous thrombosis prophylaxis.  3. Dysphagia.  4. Hypertension.  5. Acute sinusitis, resolved.  6. Hyperlipidemia.  7. Gastroesophageal reflux disease.  8. Asthma.    12/11/2014   Acute ov/Aloni Chuang re: Jaynie Bream Teryl Lucy not better with saba / no med calendar  Chief Complaint  Patient presents with  . Acute Visit    Pt c/o increased SOB for a little over a wk now. She c/o nasal congestion. She also c/o "bad wheezing at night"- wakes her up sometimes.   has not been back to Dr [name], says people come in and steal her papers for disability and med calendars so hasn't been able to use either set Afrin really helps but doesn't know how to use it.  >>augmentin x 20 d .    03/23/2015 Acute OV  Presents for an acute office visit.  Complains of 4 days of cough, congestion, green mucus, wheezing .  She denies any chest pain, orthopnea, PND or leg swelling Has been using Mucinex and flutter valve. 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 She has not been able to go to the ENT due to this..  REC Prednisone taper over next week.  Zpack take as directed.  Mucinex DM Twice daily  As needed  Cough/congestion .  Fluids and rest .    Saline nasal rinses As needed       OV 04/05/2015 Ann Lions note:  Chief Complaint  Patient presents with  . Acute Visit    Pt saw TP on 4/19 and has not yet taken pred taper or zpak. Pt c/o hoarseness, prod cough green and white mucus. Pt denies CP/tightness and f/c/s.    - . Here acute visit. She saw nurse practitioner 2 weeks ago but then did not follow through with instructions of prednisone and antibiotics. A few days ago got exposed to several cats at a potential employers place and also flowers. Yesterday she did sitting at her church at full voice but today she has lost her voice and is having increased cough and chest tightness with associated wheezing and extreme hoarse voice. In the Office she speaking with a low  squeaky voice. Symptoms are rated as severe but not severe enough to need admissioin. MDI not helping. Symptoms now ongoing x 2-3 days rec Unclear if you have asthma flareup or vocal cord flareup or both  - cat and flowers could have triggered it Please take prednisone 40 mg x1 day, then 30 mg x1 day, then 20 mg x1 day, then 10 mg x1 day, and then 5 mg x1 day and stop Please take Z pak    05/10/2015 acute  ov/Sayge Salvato re: ashtma vs vcd/ did  not bring calendar  Chief Complaint  Patient presents with  . Acute Visit    Pt c/o runny nose, PND, sinus congestion, sneezing, prod cough with light green mucus X2-3 days.    better p last rx with pred/ zpak per Ramaswamy  Has not returned to ent yet/ acutely worse x 3 days but not absence of sob and all started with recurrent nasal congestion  rec Work on inhaler technique:   Augmentin 875 mg take one pill twice daily  X 10 days - Prednisone 10 mg take  4 each am x 2 days,   2 each am x 2 days,  1 each am x 2 days and stop     09/07/2015 acute extended ov/Nunzio Banet re: cough flare / no ent f/u yet - maint on dulera 100 2bid /singulair/ gerd rx and has med calendar but not using action plans  Chief Complaint  Patient presents with   . Acute Visit    pt states she has a prod cough thick and light in color and at times greenish for 2 weeks with hoarsness.   no c/o SOB or chest tightness.  pt states she did salt water guarggles  and was perscribed Tessalon  by her PCP yesterday.   >>augmentin and pred taper   10/05/2015 NP Follow up : Cough    Pt returns for 1 month follow up .  Seen last ov with sinusitis and bronchiis , tx w/ augmentin and pred taper  She is feeling better. Nasal congestion and drainage seem to come and go with new season.  She does feel better. Ready for flu shot today   We reviewed all her meds today with pt education .  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 Follow-up    Pt states that breathing is doing well since last OV. Pt c/o issues with nose congestion and increased PND- Did not see [facility]- using Afrin as directed      Not limited by breathing from desired activities    No obvious day to day or daytime variability or assoc chronic cough or cp or chest tightness, subjective wheeze or overt   hb symptoms. No unusual exp hx or h/o childhood pna/ asthma or knowledge of premature birth.  Sleeping ok without nocturnal  or early am exacerbation  of respiratory  c/o's or need for noct saba. Also denies any obvious fluctuation of symptoms with weather or environmental changes or other aggravating or alleviating factors except as outlined above   Current Medications, Allergies, Complete Past Medical History, Past Surgical History, Family History, and Social History were reviewed in [employer] record.  ROS  The following are not active complaints unless bolded sore throat, dysphagia, dental problems, itching, sneezing,  nasal congestion or excess/ purulent secretions, ear ache,   fever, chills, sweats, unintended wt loss, classically pleuritic or  exertional cp, hemoptysis,  orthopnea pnd or leg swelling, presyncope, palpitations, abdominal pain, anorexia, nausea, vomiting, diarrhea  or change in bowel or bladder habits, change in stools or urine, dysuria,hematuria,  rash, arthralgias, visual complaints, headache, numbness, weakness or ataxia or problems with walking or coordination,  change in mood/affect or memory.                 Objective:  amb bf   nasal tone to voice/ hoarse with prominent  pseudowheeze   Wt Readings from Last 3 Encounters:  01/07/16 150 lb 9.6 oz (68.312 kg)  10/05/15 148 lb (67.132 kg)  09/17/15 148 lb (67.132 kg)    Vital signs reviewed    HEENT: nl dentition,  and oropharynx. Nl external ear canals without cough reflex R > L mucopurulent secretions and turbinate swelling    NECK :  without JVD/Nodes/TM/ nl carotid upstrokes bilaterally   LUNGS: no acc muscle use, clear to A and P bilaterally with plm without cough on insp or exp maneuvers   CV:  RRR  no s3 or murmur or increase in P2, no edema   ABD:  soft and nontender with nl excursion in the supine position. No bruits or organomegaly, bowel sounds nl  MS:  warm without deformities, calf tenderness, cyanosis or clubbing  SKIN: warm and dry without lesions    NEURO:  alert, approp, no deficits      CXR:  05/25/15 No edema or consolidation.        Assessment & Plan:

## 2016-01-07 NOTE — Patient Instructions (Signed)
See calendar for specific medication instructions and bring it back for each and every office visit for every healthcare provider you see.  Without it,  you may not receive the best quality medical care that we feel you deserve.  You will note that the calendar groups together  your maintenance  medications that are timed at particular times of the day.  Think of this as your checklist for what your doctor has instructed you to do until your next evaluation to see what benefit  there is  to staying on a consistent group of medications intended to keep you well.  The other group at the bottom is entirely up to you to use as you see fit  for specific symptoms that may arise between visits that require you to treat them on an as needed basis.  Think of this as your action plan or "what if" list.   Separating the top medications from the bottom group is fundamental to providing you adequate care going forward.    For itchy eyes or eyes running see your Eye doctor  For ongoing nasal congestion/ stuffy head see Dr [name]   Please schedule a follow up visit in 3 months but call sooner if needed to See [name] on return with all medications in hand to check against to your calendar

## 2016-01-10 ENCOUNTER — Ambulatory Visit (INDEPENDENT_AMBULATORY_CARE_PROVIDER_SITE_OTHER): Payer: Self-pay | Admitting: *Deleted

## 2016-01-10 DIAGNOSIS — I6330 Cerebral infarction due to thrombosis of unspecified cerebral artery: Secondary | ICD-10-CM

## 2016-01-11 NOTE — Progress Notes (Signed)
Carelink Summary Report / Loop Recorder 

## 2016-01-12 ENCOUNTER — Ambulatory Visit: Payer: Self-pay | Admitting: Internal Medicine

## 2016-01-12 ENCOUNTER — Ambulatory Visit (INDEPENDENT_AMBULATORY_CARE_PROVIDER_SITE_OTHER): Payer: Self-pay | Admitting: Internal Medicine

## 2016-01-12 ENCOUNTER — Telehealth: Payer: Self-pay | Admitting: Internal Medicine

## 2016-01-12 ENCOUNTER — Encounter: Payer: Self-pay | Admitting: Internal Medicine

## 2016-01-12 DIAGNOSIS — J328 Other chronic sinusitis: Secondary | ICD-10-CM

## 2016-01-12 MED ORDER — PREDNISONE 10 MG PO TABS: ORAL_TABLET | ORAL | Status: DC

## 2016-01-12 MED ORDER — AMOXICILLIN-POT CLAVULANATE 875-125 MG PO TABS: 1.0000 | ORAL_TABLET | Freq: Two times a day (BID) | ORAL | Status: DC

## 2016-01-12 NOTE — Telephone Encounter (Signed)
Pt added to MW at 1:45 today.

## 2016-01-12 NOTE — Progress Notes (Signed)
Subjective:    Patient ID: Andrea Mason, female    DOB: June 06, 1951  MRN: [id number]      Brief patient profile:  10yobf never smoker followed by Dr Loretha Brasil for allergic rhinitis and asthma   History of Present Illness  02/05/2013 1st pulmonary eval in EPIC era baseline = completely better on qvar 80 2bid and only use rescue once a week at baseline then much worse starting 3/3 with cough/ congestion/ green mucus and rescue x one by time of ov at 2pm.  No resting sob, very hoarse with harsh barking cough >Augmentin rx    DATE OF ADMISSION: 11/25/2013  DATE OF DISCHARGE: 12/10/2013   DISCHARGE DIAGNOSES:  1. Embolic left middle cerebral artery infarction.  2. Subcutaneous Lovenox for deep venous thrombosis prophylaxis.  3. Dysphagia.  4. Hypertension.  5. Acute sinusitis, resolved.  6. Hyperlipidemia.  7. Gastroesophageal reflux disease.  8. Asthma.    12/11/2014   Acute ov/Andrea Mason re: Andrea Mason not better with saba / no med calendar  Chief Complaint  Patient presents with  . Acute Visit    Pt c/o increased SOB for a little over a wk now. She c/o nasal congestion. She also c/o "bad wheezing at night"- wakes her up sometimes.   has not been back to Dr [name], says people come in and steal her papers for disability and med calendars so hasn't been able to use either set Afrin really helps but doesn't know how to use it.  >>augmentin x 20 d .    03/23/2015 Acute OV  Presents for an acute office visit.  Complains of 4 days of cough, congestion, green mucus, wheezing .  She denies any chest pain, orthopnea, PND or leg swelling Has been using Mucinex and flutter valve. 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 She has not been able to go to the ENT due to this..  REC Prednisone taper over next week.  Zpack take as directed.  Mucinex DM Twice daily  As needed  Cough/congestion .  Fluids and rest .    Saline nasal rinses As needed       OV 04/05/2015 Andrea Mason note:  Chief Complaint  Patient presents with  . Acute Visit    Pt saw TP on 4/19 and has not yet taken pred taper or zpak. Pt c/o hoarseness, prod cough green and white mucus. Pt denies CP/tightness and f/c/s.    - . Here acute visit. She saw nurse practitioner 2 weeks ago but then did not follow through with instructions of prednisone and antibiotics. A few days ago got exposed to several cats at a potential employers place and also flowers. Yesterday she did sitting at her church at full voice but today she has lost her voice and is having increased cough and chest tightness with associated wheezing and extreme hoarse voice. In the Office she speaking with a low  squeaky voice. Symptoms are rated as severe but not severe enough to need admissioin. MDI not helping. Symptoms now ongoing x 2-3 days rec Unclear if you have asthma flareup or vocal cord flareup or both  - cat and flowers could have triggered it Please take prednisone 40 mg x1 day, then 30 mg x1 day, then 20 mg x1 day, then 10 mg x1 day, and then 5 mg x1 day and stop Please take Z pak    05/10/2015 acute  ov/Andrea Mason re: ashtma vs vcd/ did  not bring calendar  Chief Complaint  Patient presents with  . Acute Visit    Pt c/o runny nose, PND, sinus congestion, sneezing, prod cough with light green mucus X2-3 days.    better p last rx with pred/ zpak per Ramaswamy  Has not returned to ent yet/ acutely worse x 3 days but not absence of sob and all started with recurrent nasal congestion  rec Work on inhaler technique:   Augmentin 875 mg take one pill twice daily  X 10 days - Prednisone 10 mg take  4 each am x 2 days,   2 each am x 2 days,  1 each am x 2 days and stop     09/07/2015 acute extended ov/Andrea Mason re: cough flare / no ent f/u yet - maint on dulera 100 2bid /singulair/ gerd rx and has med calendar but not using action plans  Chief Complaint  Patient presents with   . Acute Visit    pt states she has a prod cough thick and light in color and at times greenish for 2 weeks with hoarsness.   no c/o SOB or chest tightness.  pt states she did salt water guarggles  and was perscribed Tessalon  by her PCP yesterday.   >>augmentin and pred taper   10/05/2015 Follow up : Cough    Pt returns for 1 month follow up .  Seen last ov with sinusitis and bronchiis , tx w/ augmentin and pred taper  She is feeling better. Nasal congestion and drainage seem to come and go with new season.  She does feel better. Ready for flu shot today   We reviewed all her meds today with pt education .  rec No change rx/ follow med calendar   01/07/2016  f/u ov/Andrea Mason re:  Chief Complaint  Patient presents with  . Follow-up    Pt states that breathing is doing well since last OV. Pt c/o issues with nose congestion and increased PND- Did not see [facility]- using Afrin as directed    rec See calendar for specific medication instructions    For itchy eyes or eyes running see your Eye doctor For ongoing nasal congestion/ stuffy head see Dr [name]  Please schedule a follow up visit in 3 months but call sooner if needed to See [name] on return with all medications in hand to check against to your calendar   01/12/2016  f/u ov/Andrea Mason re: severe nasal congestion / never called ENT Chief Complaint  Patient presents with  . Acute Visit    Pt c/o nasal congestion and has had green nasal d/c.   no cough or sob just can't breath thru nose and now drainage is purulent/ no sinus pain or fever /toothache  No obvious day to day or daytime variability or assoc  cp or chest tightness, subjective wheeze or overt   hb symptoms. No unusual exp hx or h/o childhood pna/ asthma or knowledge of premature birth.  Sleeping ok without nocturnal  or early am exacerbation  of respiratory  c/o's or need for noct saba. Also denies any obvious fluctuation of symptoms with weather or environmental changes or other  aggravating or alleviating factors except as outlined above   Current Medications, Allergies, Complete Past Medical History, Past Surgical History, Family History, and Social History were reviewed in [employer] record.  ROS  The following are not active complaints unless bolded sore throat, dysphagia, dental problems, itching, sneezing,  nasal congestion or excess/ purulent  secretions, ear ache,   fever, chills, sweats, unintended wt loss, classically pleuritic or exertional cp, hemoptysis,  orthopnea pnd or leg swelling, presyncope, palpitations, abdominal pain, anorexia, nausea, vomiting, diarrhea  or change in bowel or bladder habits, change in stools or urine, dysuria,hematuria,  rash, arthralgias, visual complaints, headache, numbness, weakness or ataxia or problems with walking or coordination,  change in mood/affect or memory.                       Objective:  amb bf   nasal tone to voice/ hoarse with prominent pseudowheeze   01/12/2016           01/07/16 150 lb 9.6 oz (68.312 kg)  10/05/15 148 lb (67.132 kg)  09/17/15 148 lb (67.132 kg)    Vital signs reviewed    HEENT: nl dentition,  and oropharynx. Nl external ear canals without cough reflex R > L mucopurulent secretions and turbinate swelling    NECK :  without JVD/Nodes/TM/ nl carotid upstrokes bilaterally   LUNGS: no acc muscle use, clear to A and P bilaterally with plm without cough on insp or exp maneuvers   CV:  RRR  no s3 or murmur or increase in P2, no edema   ABD:  soft and nontender with nl excursion in the supine position. No bruits or organomegaly, bowel sounds nl  MS:  warm without deformities, calf tenderness, cyanosis or clubbing  SKIN: warm and dry without lesions    NEURO:  alert, approp, no deficits      CXR:  05/25/15 No edema or consolidation.        Assessment & Plan:

## 2016-01-12 NOTE — Patient Instructions (Signed)
Augmentin 875 mg take one pill twice daily  X 10 days - take at breakfast and supper with large glass of water.  It would help reduce the usual side effects (diarrhea and yeast infections) if you ate cultured yogurt at lunch.   Prednisone 10 mg take  4 each am x 2 days,   2 each am x 2 days,  1 each am x 2 days and stop   Make sure to make appt to see [name] in about 2 weeks

## 2016-01-12 NOTE — Assessment & Plan Note (Signed)
Acutely worse with purulent rhinitis and likely also developing sinusitis but no asthma flare yet  rec augmentin x10 days and Prednisone 10 mg take  4 each am x 2 days,   2 each am x 2 days,  1 each am x 2 days and stop and f/u with Dr [name] for ongoing care   I had an extended discussion with the patient reviewing all relevant studies completed to date and  lasting 10  minutes of a 15 minute visit    Each maintenance medication was reviewed in detail including most importantly the difference between maintenance and prns and under what circumstances the prns are to be triggered using an action plan format that is not reflected in the computer generated alphabetically organized AVS but trather by a customized med calendar that reflects the AVS meds with confirmed 100% correlation.   Please see instructions for details which were reviewed in writing and the patient given a copy highlighting the part that I personally wrote and discussed at today's ov.

## 2016-01-15 ENCOUNTER — Encounter: Payer: Self-pay | Admitting: Internal Medicine

## 2016-01-15 NOTE — Assessment & Plan Note (Signed)
-  med calendar 03/17/2013 > not using 10/24/2013 , 04/07/2014  > did not bring to clinic 10/20/14  Or 12/11/2014   - Spirometry 12/11/2014  Min abn mid flows only  - 09/07/2015  extensive coaching HFA effectiveness =    75% (Ti too short)   Despite poorly controlled rhinitis/ All goals of chronic asthma control met including optimal function and elimination of symptoms with minimal need for rescue therapy.  Contingencies discussed in full including contacting this office immediately if not controlling the symptoms using the rule of two's.

## 2016-01-15 NOTE — Assessment & Plan Note (Signed)
She does have active purulent rhinitis but this is chronic/ recurrent so directed back to Dr [name]

## 2016-01-19 ENCOUNTER — Ambulatory Visit (INDEPENDENT_AMBULATORY_CARE_PROVIDER_SITE_OTHER): Payer: Self-pay | Admitting: Internal Medicine

## 2016-01-19 ENCOUNTER — Encounter: Payer: Self-pay | Admitting: Internal Medicine

## 2016-01-19 VITALS — BP 118/82 | HR 70 | Temp 97.7°F | Resp 20 | Wt 153.0 lb

## 2016-01-19 DIAGNOSIS — I10 Essential (primary) hypertension: Secondary | ICD-10-CM

## 2016-01-19 DIAGNOSIS — N3281 Overactive bladder: Secondary | ICD-10-CM | POA: Insufficient documentation

## 2016-01-19 MED ORDER — FESOTERODINE FUMARATE ER 8 MG PO TB24: 8.0000 mg | ORAL_TABLET | Freq: Every day | ORAL | Status: DC

## 2016-01-19 NOTE — Patient Instructions (Signed)
Ok to incresae the Toviaz to 8 mg per day  A prescription was faxed to the [facility], as well as a copy given to you  Please continue all other medications as before, and refills have been done if requested.  Please have the pharmacy call with any other refills you may need.  Please keep your appointments with your specialists as you may have planned

## 2016-01-19 NOTE — Progress Notes (Signed)
Pre visit review using our clinic review tool, if applicable. No additional management support is needed unless otherwise documented below in the visit note. 

## 2016-01-19 NOTE — Assessment & Plan Note (Signed)
Ok to increase the toviaz to 8 mg daily, and faxed to [facility], as well as given hardcopy; pt counseled to watch for side effect such as constipation, dry mouth, dizziness or slowed cognition

## 2016-01-19 NOTE — Progress Notes (Signed)
Subjective:    Patient ID: Andrea Mason, female    DOB: [birth year], 65 y.o.   MRN: [id number]  HPI  Here to f/u, simply asks for increased toviaz due to incresaed urinary freq in last few months with wetting accidents several.  Denies urinary symptoms such as dysuria, frequency, urgency, flank pain, hematuria or n/v, fever, chills.  Just urinated prior to visit, unable to give specimen Past Medical History  Diagnosis Date  . Hypertension   . GERD (gastroesophageal reflux disease)   . Allergic rhinitis   . Asthma   . Obesity   . Depression   . History of colonic polyps   . DJD (degenerative joint disease), cervical   . 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 Stroke [facility])    Past Surgical History  Procedure Laterality Date  . Nasal sinus surgery  07/2006    Dr. [name]  . Nasal polyp surgery  07/2006    x 2 with Dr. [name]  . Foot surgery  1998  . Tubal ligation    . 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 She has never used smokeless tobacco. She reports that she does not drink alcohol or use illicit drugs. family history includes Cancer in her mother; Colon cancer in her brother; Diabetes in her sister. Allergies  Allergen Reactions  . Tramadol Other (See Comments)    Almost passed out  . Ivp Dye [Iodinated Diagnostic Agents] Nausea And Vomiting and Other (See Comments)    Reaction: hot flashes  . Promethazine-Codeine Nausea And Vomiting   Current Outpatient Prescriptions on File Prior to Visit  Medication Sig Dispense Refill  . acetaminophen (TYLENOL) 325 MG tablet 325 mg. Take per bottle as needed for pain    . albuterol (PROVENTIL HFA;VENTOLIN HFA) 108 (90 BASE) MCG/ACT inhaler Inhale 2 puffs into the lungs every 4 (four) hours as needed for wheezing or shortness of breath. 1-2 puffs every 4-6 hours as needed for wheezing/shortness of breath 1  Inhaler 1  . amLODipine (NORVASC) 5 MG tablet TAKE 1 TABLET BY MOUTH ONCE DAILY 90 tablet 3  . amoxicillin-clavulanate (AUGMENTIN) 875-125 MG tablet Take 1 tablet by mouth 2 (two) times daily. 20 tablet 0  . aspirin 81 MG tablet Take 325 mg by mouth every morning.     . bacitracin ointment Apply 1 application topically 2 (two) times daily. 15 g 0  . baclofen (LIORESAL) 10 MG tablet Take 0.5 tablets (5 mg total) by mouth 3 (three) times daily. 30 each 0  . benzonatate (TESSALON) 100 MG capsule Take 1 capsule (100 mg total) by mouth 2 (two) times daily as needed for cough. 40 capsule 0  . Biotin 5000 MCG CAPS Take 1 capsule by mouth every morning.     . Cetirizine HCl (ZYRTEC ALLERGY PO) Take 1 tablet by mouth at bedtime.    Marland Kitchen dextromethorphan-guaiFENesin (MUCINEX DM) 30-600 MG per 12 hr tablet 1-2 every 12 hours as needed for cough (Patient taking differently: 1-2 by mouth twice a day as needed for congestion and sinus pressure) 120 tablet 11  . esomeprazole (NEXIUM) 40 MG capsule Take 1 capsule (40 mg total) by mouth daily at 12 noon. (Patient taking differently: Take 40 mg by mouth daily before breakfast. ) 90 capsule 2  . mometasone (NASONEX) 50 MCG/ACT nasal spray Place 2 sprays into the nose 2 (two) times  daily. 51 g 3  . mometasone-formoterol (DULERA) 100-5 MCG/ACT AERO Inhale 2 puffs into the lungs 2 (two) times daily. 1 Inhaler 3  . montelukast (SINGULAIR) 10 MG tablet TAKE 1 TABLET BY MOUTH AT BEDTIME 30 tablet 5  . oxymetazoline (AFRIN) 0.05 % nasal spray Place 2 sprays into both nostrils 2 (two) times daily as needed (nasal stuffiness and nasal pressure for 5 days).     . predniSONE (DELTASONE) 10 MG tablet Take  4 each am x 2 days,   2 each am x 2 days,  1 each am x 2 days and stop 14 tablet 0  . ranitidine (ZANTAC) 150 MG tablet Take 150 mg by mouth at bedtime.    Marland Kitchen Respiratory Therapy Supplies (FLUTTER) DEVI Use as needed for cough and congestion    . rosuvastatin (CRESTOR) 10 MG  tablet Take 1 tablet (10 mg total) by mouth every morning. 90 tablet 3  . sodium chloride (OCEAN) 0.65 % SOLN nasal spray Place 2 sprays into both nostrils as needed (dry nose).     Marland Kitchen spironolactone (ALDACTONE) 25 MG tablet Take 25 mg by mouth every morning.      No current facility-administered medications on file prior to visit.    Review of Systems All otherwise neg per pt     Objective:   Physical Exam BP 118/82 mmHg  Pulse 70  Temp(Src) 97.7 F (36.5 C) (Oral)  Resp 20  Wt 153 lb (69.4 kg)  SpO2 99% VS noted, not ill appearing Constitutional: Pt appears in no significant distress HENT: Head: NCAT.  Right Ear: External ear normal.  Left Ear: External ear normal.  Eyes: . Pupils are equal, round, and reactive to light. Conjunctivae and EOM are normal Neck: Normal range of motion. Neck supple.  Cardiovascular: Normal rate and regular rhythm.   Pulmonary/Chest: Effort normal and breath sounds without rales or wheezing.  Abd:  Soft, NT, ND, + BS Neurological: Pt is alert. Not confused , motor grossly intact Skin: Skin is warm. No rash, no LE edema Psychiatric: Pt behavior is normal. No agitation.       Assessment & Plan:

## 2016-01-19 NOTE — Assessment & Plan Note (Signed)
stable overall by history and exam, recent data reviewed with pt, and pt to continue medical treatment as before,  to f/u any worsening symptoms or concerns BP Readings from Last 3 Encounters:  01/19/16 118/82  01/12/16 138/76  01/07/16 120/76

## 2016-01-21 LAB — CUP PACEART REMOTE DEVICE CHECK: Date Time Interrogation Session: 20170106210814

## 2016-01-21 NOTE — Progress Notes (Signed)
Carelink summary report received. Battery status OK. Normal device function. No new symptom episodes, tachy episodes, brady, or pause episodes. No new AF episodes. Monthly summary reports and ROV/PRN 

## 2016-02-02 MED FILL — predniSONE 10 MG TABS: 10 | 6 days supply | Qty: 14 | Fill #0

## 2016-02-08 ENCOUNTER — Ambulatory Visit (INDEPENDENT_AMBULATORY_CARE_PROVIDER_SITE_OTHER): Payer: Self-pay | Admitting: *Deleted

## 2016-02-08 DIAGNOSIS — I6330 Cerebral infarction due to thrombosis of unspecified cerebral artery: Secondary | ICD-10-CM

## 2016-02-08 LAB — CUP PACEART REMOTE DEVICE CHECK: MDC IDC SESS DTM: 20170205210807

## 2016-02-08 NOTE — Progress Notes (Signed)
Carelink summary report received. Battery status OK. Normal device function. No new symptom episodes, tachy episodes, brady, or pause episodes. No new AF episodes. Monthly summary reports and ROV/PRN 

## 2016-02-11 ENCOUNTER — Other Ambulatory Visit: Payer: Self-pay | Admitting: Internal Medicine

## 2016-02-11 MED ORDER — ALBUTEROL SULFATE HFA 108 (90 BASE) MCG/ACT IN AERS
2.0000 | INHALATION_SPRAY | RESPIRATORY_TRACT | Status: DC | PRN
Start: 2016-02-11 — End: 2016-12-14

## 2016-02-11 NOTE — Progress Notes (Signed)
Carelink Summary Report / Loop Recorder 

## 2016-02-12 LAB — CUP PACEART REMOTE DEVICE CHECK: Date Time Interrogation Session: 20170307213652

## 2016-02-12 NOTE — Progress Notes (Signed)
Carelink summary report received. Battery status OK. Normal device function. No new symptom episodes, tachy episodes, brady, or pause episodes. No new AF episodes. Monthly summary reports and ROV/PRN 

## 2016-03-09 ENCOUNTER — Ambulatory Visit (INDEPENDENT_AMBULATORY_CARE_PROVIDER_SITE_OTHER): Payer: Self-pay | Admitting: *Deleted

## 2016-03-09 DIAGNOSIS — I6330 Cerebral infarction due to thrombosis of unspecified cerebral artery: Secondary | ICD-10-CM

## 2016-03-09 MED FILL — SPIRONOLACTONE 25 MG TABLET: 25 | 60 days supply | Qty: 30 | Fill #0

## 2016-03-10 ENCOUNTER — Other Ambulatory Visit: Payer: Self-pay | Admitting: *Deleted

## 2016-03-10 MED ORDER — ESOMEPRAZOLE MAGNESIUM 40 MG PO CPDR: 40.0000 mg | DELAYED_RELEASE_CAPSULE | Freq: Every day | ORAL | Status: DC

## 2016-03-10 NOTE — Progress Notes (Signed)
Carelink Summary Report / Loop Recorder 

## 2016-04-05 ENCOUNTER — Ambulatory Visit (INDEPENDENT_AMBULATORY_CARE_PROVIDER_SITE_OTHER): Payer: Self-pay | Admitting: Adult Health

## 2016-04-05 ENCOUNTER — Encounter: Payer: Self-pay | Admitting: Adult Health

## 2016-04-05 ENCOUNTER — Telehealth: Payer: Self-pay | Admitting: Internal Medicine

## 2016-04-05 VITALS — BP 120/80 | HR 80 | Temp 98.2°F | Ht 59.0 in | Wt 163.0 lb

## 2016-04-05 DIAGNOSIS — J4530 Mild persistent asthma, uncomplicated: Secondary | ICD-10-CM

## 2016-04-05 MED ORDER — FESOTERODINE FUMARATE ER 4 MG PO TB24: 4.0000 mg | ORAL_TABLET | Freq: Every day | ORAL | Status: DC

## 2016-04-05 NOTE — Assessment & Plan Note (Signed)
Recent flare, now resolved Patient's medications were reviewed today and patient education was given. Computerized medication calendar was adjusted/completed    Plan  Follow med calendar closely and bring to each visit.  Follow up Dr. Melvyn Novas  In 3-4  months and As needed   Please contact office for sooner follow up if symptoms do not improve or worsen or seek emergency care

## 2016-04-05 NOTE — Patient Instructions (Signed)
Follow med calendar closely and bring to each visit.  Follow up Dr. Wert  In 3-4  months and As needed   Please contact office for sooner follow up if symptoms do not improve or worsen or seek emergency care   

## 2016-04-05 NOTE — Telephone Encounter (Signed)
[place] for the 4 mg and should not cause wt gain

## 2016-04-05 NOTE — Telephone Encounter (Signed)
Patient aware.

## 2016-04-05 NOTE — Progress Notes (Signed)
Subjective:    Patient ID: Andrea Mason, female    DOB: June 06, 1951  MRN: [id number]      Brief patient profile:  10yobf never smoker followed by Dr Andrea Mason for allergic rhinitis and asthma   History of Present Illness  02/05/2013 1st pulmonary eval in EPIC era baseline = completely better on qvar 80 2bid and only use rescue once a week at baseline then much worse starting 3/3 with cough/ congestion/ green mucus and rescue x one by time of ov at 2pm.  No resting sob, very hoarse with harsh barking cough >Augmentin rx    DATE OF ADMISSION: 11/25/2013  DATE OF DISCHARGE: 12/10/2013   DISCHARGE DIAGNOSES:  1. Embolic left middle cerebral artery infarction.  2. Subcutaneous Lovenox for deep venous thrombosis prophylaxis.  3. Dysphagia.  4. Hypertension.  5. Acute sinusitis, resolved.  6. Hyperlipidemia.  7. Gastroesophageal reflux disease.  8. Asthma.    12/11/2014   Acute ov/Andrea Mason re: Andrea Mason not better with saba / no med calendar  Chief Complaint  Patient presents with  . Acute Visit    Pt c/o increased SOB for a little over a wk now. She c/o nasal congestion. She also c/o "bad wheezing at night"- wakes her up sometimes.   has not been back to Dr Andrea Mason, says people come in and steal her papers for disability and med calendars so hasn't been able to use either set Afrin really helps but doesn't know how to use it.  >>augmentin x 20 d .    03/23/2015 Acute OV  Presents for an acute office visit.  Complains of 4 days of cough, congestion, green mucus, wheezing .  She denies any chest pain, orthopnea, PND or leg swelling Has been using Mucinex and flutter valve. 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 She has not been able to go to the ENT due to this..  REC Prednisone taper over next week.  Zpack take as directed.  Mucinex DM Twice daily  As needed  Cough/congestion .  Fluids and rest .    Saline nasal rinses As needed       OV 04/05/2015 Andrea Mason note:  Chief Complaint  Patient presents with  . Acute Visit    Pt saw TP on 4/19 and has not yet taken pred taper or zpak. Pt c/o hoarseness, prod cough green and white mucus. Pt denies CP/tightness and f/c/s.    - . Here acute visit. She saw nurse practitioner 2 weeks ago but then did not follow through with instructions of prednisone and antibiotics. A few days ago got exposed to several cats at a potential employers place and also flowers. Yesterday she did sitting at her church at full voice but today she has lost her voice and is having increased cough and chest tightness with associated wheezing and extreme hoarse voice. In the Office she speaking with a low  squeaky voice. Symptoms are rated as severe but not severe enough to need admissioin. MDI not helping. Symptoms now ongoing x 2-3 days rec Unclear if you have asthma flareup or vocal cord flareup or both  - cat and flowers could have triggered it Please take prednisone 40 mg x1 day, then 30 mg x1 day, then 20 mg x1 day, then 10 mg x1 day, and then 5 mg x1 day and stop Please take Z pak    05/10/2015 acute  ov/Andrea Mason re: ashtma vs vcd/ did  not bring calendar  Chief Complaint  Patient presents with  . Acute Visit    Pt c/o runny nose, PND, sinus congestion, sneezing, prod cough with light green mucus X2-3 days.    better p last rx with pred/ zpak per Andrea Mason  Has not returned to ent yet/ acutely worse x 3 days but not absence of sob and all started with recurrent nasal congestion  rec Work on inhaler technique:   Augmentin 875 mg take one pill twice daily  X 10 days - Prednisone 10 mg take  4 each am x 2 days,   2 each am x 2 days,  1 each am x 2 days and stop     09/07/2015 acute extended ov/Andrea Mason re: cough flare / no ent f/u yet - maint on dulera 100 2bid /singulair/ gerd rx and has med calendar but not using action plans  Chief Complaint  Patient presents with   . Acute Visit    pt states she has a prod cough thick and light in color and at times greenish for 2 weeks with hoarsness.   no c/o SOB or chest tightness.  pt states she did salt water guarggles  and was perscribed Tessalon  by her PCP yesterday.   >>augmentin and pred taper   10/05/2015 Follow up : Cough    Pt returns for 1 month follow up .  Seen last ov with sinusitis and bronchiis , tx w/ augmentin and pred taper  She is feeling better. Nasal congestion and drainage seem to come and go with new season.  She does feel better. Ready for flu shot today   We reviewed all her meds today with pt education .  rec No change rx/ follow med calendar   01/07/2016  f/u ov/Andrea Mason re:  Chief Complaint  Patient presents with  . Follow-up    Pt states that breathing is doing well since last OV. Pt c/o issues with nose congestion and increased PND- Did not see [facility]- using Afrin as directed    rec See calendar for specific medication instructions    For itchy eyes or eyes running see your Eye doctor For ongoing nasal congestion/ stuffy head see Dr Andrea Mason  Please schedule a follow up visit in 3 months but call sooner if needed to See Andrea Mason on return with all medications in hand to check against to your calendar   01/12/2016  f/u ov/Andrea Mason re: severe nasal congestion / never called ENT Chief Complaint  Patient presents with  . Acute Visit    Pt c/o nasal congestion and has had green nasal d/c.   no cough or sob just can't breath thru nose and now drainage is purulent/ no sinus pain or fever /toothache  >>augmentin /pred   04/05/2016 Follow up : Cough /Asthma vs VCD  Patient returns for a two-month follow-up. Patient says that she is doing well. She denies any flare for cough or wheezing. Feels that her overall breathing has been doing better. She was seen last visit with a asthmatic bronchitic flare and sinusitis. He improved with Augmentin and prednisone taper. We reviewed all her medications  organize them into a medication count with patient education. Appears that she is taking her medications correctly. He denies any hemoptysis, chest pain, orthopnea, PND, or increased leg swelling   Current Medications, Allergies, Complete Past Medical History, Past Surgical History, Family History, and Social History were reviewed in [employer] record.  ROS  The following are not  active complaints unless bolded sore throat, dysphagia, dental problems, itching, sneezing,    ear ache,   fever, chills, sweats, unintended wt loss, classically pleuritic or exertional cp, hemoptysis,  orthopnea pnd or leg swelling, presyncope, palpitations, abdominal pain, anorexia, nausea, vomiting, diarrhea  or change in bowel or bladder habits, change in stools or urine, dysuria,hematuria,  rash, arthralgias, visual complaints, headache, numbness, weakness or ataxia or problems with walking or coordination,  change in mood/affect or memory.                       Objective:  amb bf   nasal tone to voice  . Filed Vitals:   04/05/16 1135  BP: 120/80  Pulse: 80  Temp: 98.2 F (36.8 C)  TempSrc: Oral  Height: 4\' 11"  (1.499 m)  Weight: 163 lb (73.936 kg)  SpO2: 99%      Vital signs reviewed    HEENT: nl dentition,  and oropharynx. Nl external ear canals without cough reflex    NECK :  without JVD/Nodes/TM/ nl carotid upstrokes bilaterally   LUNGS: no acc muscle use, clear to A and P bilaterally with plm without cough on insp or exp maneuvers   CV:  RRR  no s3 or murmur or increase in P2, no edema   ABD:  soft and nontender with nl excursion in the supine position. No bruits or organomegaly, bowel sounds nl  MS:  warm without deformities, calf tenderness, cyanosis or clubbing  SKIN: warm and dry without lesions    NEURO:  alert, approp, no deficits      CXR:  05/25/15 No edema or consolidation.    Raydell Maners NP-C  [facility]     04/05/2016 Assessment & Plan:

## 2016-04-05 NOTE — Addendum Note (Signed)
Addended by: Osa Craver on: 04/05/2016 12:15 PM   Modules accepted: Medications

## 2016-04-05 NOTE — Telephone Encounter (Signed)
Pt came by office stating she wants to reduce her Toviaz to 4mg  instead of the 8mg  she was increased to, if you think that is ok to do.  She is worried about a side effect of falling with the increased mg dose.  Also, pt wants to know if Lisbeth Ply can cause weight gain?  Please advise

## 2016-04-05 NOTE — Progress Notes (Signed)
Chart and office note reviewed in detail  > agree with a/p as outlined    

## 2016-04-05 NOTE — Telephone Encounter (Signed)
Medication faxed to pharmacy 

## 2016-04-10 ENCOUNTER — Ambulatory Visit (INDEPENDENT_AMBULATORY_CARE_PROVIDER_SITE_OTHER): Payer: Self-pay | Admitting: *Deleted

## 2016-04-10 DIAGNOSIS — I6330 Cerebral infarction due to thrombosis of unspecified cerebral artery: Secondary | ICD-10-CM

## 2016-04-10 MED FILL — AMLODIPINE BESYLATE 5 MG TA: 5 | 90 days supply | Qty: 90 | Fill #1

## 2016-04-10 MED FILL — SPIRONOLACTONE 25 MG TABLET: 25 | 30 days supply | Qty: 30 | Fill #1

## 2016-04-11 NOTE — Progress Notes (Signed)
Carelink Summary Report / Loop Recorder 

## 2016-04-13 ENCOUNTER — Encounter: Payer: Self-pay | Admitting: Internal Medicine

## 2016-04-29 LAB — CUP PACEART REMOTE DEVICE CHECK: Date Time Interrogation Session: 20170406213719

## 2016-04-29 NOTE — Progress Notes (Signed)
Carelink summary report received. Battery status OK. Normal device function. No new symptom episodes, tachy episodes, brady, or pause episodes. No new AF episodes. Monthly summary reports and ROV/PRN 

## 2016-05-08 ENCOUNTER — Ambulatory Visit (INDEPENDENT_AMBULATORY_CARE_PROVIDER_SITE_OTHER): Payer: Self-pay | Admitting: *Deleted

## 2016-05-08 DIAGNOSIS — I6330 Cerebral infarction due to thrombosis of unspecified cerebral artery: Secondary | ICD-10-CM

## 2016-05-08 MED FILL — SPIRONOLACTONE 25 MG TABLET: 25 | 30 days supply | Qty: 30 | Fill #2

## 2016-05-09 NOTE — Progress Notes (Signed)
Carelink Summary Report / Loop Recorder 

## 2016-05-18 LAB — CUP PACEART REMOTE DEVICE CHECK: MDC IDC SESS DTM: 20170506220734

## 2016-05-27 ENCOUNTER — Telehealth: Payer: Self-pay | Admitting: Pulmonary Disease

## 2016-05-27 NOTE — Telephone Encounter (Signed)
Called by Andrea Mason who c/o green mucous and she thinks she has a fever. However, she is unable to take her temperature. She says she is congested. Needed her rescue inhaler this AM. Able to speak in full sentences on the phone. I do not appreciate any wheezing over the phone. I will phone her a prescription for Augmentin 872-125 mg, # 20, take 1 tablet PO BID for 10 days. She was instructed to call the PCCM office Monday morning to schedule a follow up appointment with Dr. Melvyn Novas. I told Tamiya that should she become more short of breath, have increased wheezing she should go the the Emergency Department to be evaluated for possible admission.

## 2016-06-02 ENCOUNTER — Telehealth: Payer: Self-pay | Admitting: Internal Medicine

## 2016-06-02 NOTE — Telephone Encounter (Signed)
Called spoke with pt. Pt is requesting samples of Nasonex and Dulera. I explained to her that we do not have any samples at a time. She voiced understanding and had no further questions. Nothing further needed.

## 2016-06-07 ENCOUNTER — Ambulatory Visit (INDEPENDENT_AMBULATORY_CARE_PROVIDER_SITE_OTHER): Payer: Self-pay | Admitting: *Deleted

## 2016-06-07 DIAGNOSIS — I6330 Cerebral infarction due to thrombosis of unspecified cerebral artery: Secondary | ICD-10-CM

## 2016-06-07 LAB — CUP PACEART REMOTE DEVICE CHECK: Date Time Interrogation Session: 20170605223623

## 2016-06-08 NOTE — Progress Notes (Signed)
Carelink Summary Report / Loop Recorder 

## 2016-06-09 ENCOUNTER — Telehealth: Payer: Self-pay | Admitting: Internal Medicine

## 2016-06-09 MED ORDER — MOMETASONE FURO-FORMOTEROL FUM 100-5 MCG/ACT IN AERO: 2.0000 | INHALATION_SPRAY | Freq: Two times a day (BID) | RESPIRATORY_TRACT | Status: DC

## 2016-06-09 MED FILL — SPIRONOLACTONE 25 MG TABLET: 25 | 30 days supply | Qty: 30 | Fill #3

## 2016-06-09 NOTE — Telephone Encounter (Signed)
Spoke with pt and advised that we do not have samples of Dulera 100, but we do have a Free Trial Card. Pt aware and will pick up this afternoon. Nothing further needed.

## 2016-06-09 NOTE — Addendum Note (Signed)
Addended by: Beckie Busing on: 06/09/2016 03:34 PM   Modules accepted: Orders

## 2016-06-23 ENCOUNTER — Emergency Department (HOSPITAL_COMMUNITY)
Admission: EM | Admit: 2016-06-23 | Discharge: 2016-06-23 | Disposition: A | Discharge status: Home / Self Care | Payer: Self-pay | Attending: Emergency Medicine | Admitting: Emergency Medicine

## 2016-06-23 ENCOUNTER — Encounter (HOSPITAL_COMMUNITY): Payer: Self-pay | Admitting: Emergency Medicine

## 2016-06-23 DIAGNOSIS — M5030 Other cervical disc degeneration, unspecified cervical region: Secondary | ICD-10-CM | POA: Insufficient documentation

## 2016-06-23 DIAGNOSIS — K5900 Constipation, unspecified: Secondary | ICD-10-CM

## 2016-06-23 DIAGNOSIS — Z7982 Long term (current) use of aspirin: Secondary | ICD-10-CM | POA: Insufficient documentation

## 2016-06-23 DIAGNOSIS — R319 Hematuria, unspecified: Secondary | ICD-10-CM

## 2016-06-23 DIAGNOSIS — Z8673 Personal history of transient ischemic attack (TIA), and cerebral infarction without residual deficits: Secondary | ICD-10-CM | POA: Insufficient documentation

## 2016-06-23 DIAGNOSIS — F329 Major depressive disorder, single episode, unspecified: Secondary | ICD-10-CM | POA: Insufficient documentation

## 2016-06-23 DIAGNOSIS — Z792 Long term (current) use of antibiotics: Secondary | ICD-10-CM | POA: Insufficient documentation

## 2016-06-23 DIAGNOSIS — Z79899 Other long term (current) drug therapy: Secondary | ICD-10-CM | POA: Insufficient documentation

## 2016-06-23 DIAGNOSIS — J45909 Unspecified asthma, uncomplicated: Secondary | ICD-10-CM | POA: Insufficient documentation

## 2016-06-23 DIAGNOSIS — I10 Essential (primary) hypertension: Secondary | ICD-10-CM | POA: Insufficient documentation

## 2016-06-23 DIAGNOSIS — Z7952 Long term (current) use of systemic steroids: Secondary | ICD-10-CM | POA: Insufficient documentation

## 2016-06-23 DIAGNOSIS — K5641 Fecal impaction: Secondary | ICD-10-CM

## 2016-06-23 LAB — CBC WITH DIFFERENTIAL/PLATELET
BASOS ABS: 0.1 10*3/uL (ref 0.0–0.1)
BASOS PCT: 1 %
EOS PCT: 16 %
Eosinophils Absolute: 1.1 10*3/uL — ABNORMAL HIGH (ref 0.0–0.7)
HCT: 36.4 % (ref 36.0–46.0)
Hemoglobin: 12.3 g/dL (ref 12.0–15.0)
LYMPHS PCT: 23 %
Lymphs Abs: 1.5 10*3/uL (ref 0.7–4.0)
MCH: 30.8 pg (ref 26.0–34.0)
MCHC: 33.8 g/dL (ref 30.0–36.0)
MCV: 91.2 fL (ref 78.0–100.0)
MONO ABS: 0.4 10*3/uL (ref 0.1–1.0)
Monocytes Relative: 6 %
Neutro Abs: 3.5 10*3/uL (ref 1.7–7.7)
Neutrophils Relative %: 54 %
PLATELETS: 166 10*3/uL (ref 150–400)
RBC: 3.99 MIL/uL (ref 3.87–5.11)
RDW: 12.2 % (ref 11.5–15.5)
WBC: 6.6 10*3/uL (ref 4.0–10.5)

## 2016-06-23 LAB — CUP PACEART REMOTE DEVICE CHECK: MDC IDC SESS DTM: 20170705223909

## 2016-06-23 LAB — BASIC METABOLIC PANEL
ANION GAP: 8 (ref 5–15)
BUN: 13 mg/dL (ref 6–20)
CALCIUM: 8.9 mg/dL (ref 8.9–10.3)
CO2: 24 mmol/L (ref 22–32)
CREATININE: 0.65 mg/dL (ref 0.44–1.00)
Chloride: 101 mmol/L (ref 101–111)
GFR calc Af Amer: >60 mL/min (ref >60)
GLUCOSE: 95 mg/dL (ref 65–99)
Potassium: 4.9 mmol/L (ref 3.5–5.1)
Sodium: 133 mmol/L — ABNORMAL LOW (ref 135–145)

## 2016-06-23 LAB — URINALYSIS, ROUTINE W REFLEX MICROSCOPIC
BILIRUBIN URINE: NEGATIVE
Glucose, UA: NEGATIVE mg/dL
Ketones, ur: NEGATIVE mg/dL
Leukocytes, UA: NEGATIVE
Nitrite: NEGATIVE
PH: 7.5 (ref 5.0–8.0)
Protein, ur: NEGATIVE mg/dL
SPECIFIC GRAVITY, URINE: 1.006 (ref 1.005–1.030)

## 2016-06-23 LAB — URINE MICROSCOPIC-ADD ON: Bacteria, UA: NONE SEEN

## 2016-06-23 MED ORDER — FLEET ENEMA 7-19 GM/118ML RE ENEM
1.0000 | ENEMA | Freq: Once | RECTAL | Status: AC
  Administered 2016-06-23: 1 via RECTAL
  Filled 2016-06-23: qty 1

## 2016-06-23 NOTE — ED Provider Notes (Signed)
CSN: [id number]     Arrival date & time 06/23/16  [id number] History   First MD Initiated Contact with Patient 06/23/16 [phone]     Chief Complaint  Patient presents with  . Constipation     (Consider location/radiation/quality/duration/timing/severity/associated sxs/prior Treatment) HPI  65 year old female who presents with constipation. She has a history of hypertension, asthma, and GERD. States that she does have a history of fecal impaction in the past. Occasionally takes MiraLAX. This not had a bowel movement in 2 days, and this morning is that she strained extremely hard to have a bowel movement and almost passed out. She has not had any abdominal pain, abdominal distension, vomiting, fevers, rectal bleeding. No urinary complaints. States that she checked herself this morning and noticed that she was impacted, and attempted to manually disimpact herself with some relief.  Past Medical History  Diagnosis Date  . Hypertension   . GERD (gastroesophageal reflux disease)   . Allergic rhinitis   . Asthma   . Obesity   . Depression   . History of colonic polyps   . DJD (degenerative joint disease), cervical   . 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 Stroke [facility])    Past Surgical History  Procedure Laterality Date  . Nasal sinus surgery  07/2006    Dr. [name]  . Nasal polyp surgery  07/2006    x 2 with Dr. [name]  . Foot surgery  1998  . Tubal ligation    . 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 Colon cancer Brother   . Diabetes Sister   . Cancer Mother    Social History  Substance Use Topics  . Smoking status: Never Smoker   . Smokeless tobacco: Never Used  . Alcohol Use: No   OB History    No data available     Review of Systems 10/14 systems reviewed and are negative other than those stated in the HPI    Allergies  Tramadol;  Ivp dye; and Promethazine-codeine  Home Medications   Prior to Admission medications   Medication Sig Start Date End Date Taking? Authorizing Provider  acetaminophen (TYLENOL) 325 MG tablet 325 mg. Take per bottle as needed for pain    Historical Provider, MD  albuterol (PROVENTIL HFA;VENTOLIN HFA) 108 (90 Base) MCG/ACT inhaler Inhale 2 puffs into the lungs every 4 (four) hours as needed for wheezing or shortness of breath. 1-2 puffs every 4-6 hours as needed for wheezing/shortness of breath Patient taking differently: 1-2 puffs every 4-6 hours as needed for wheezing/shortness of breath 02/11/16   Tanda Rockers, MD  amLODipine (NORVASC) 5 MG tablet TAKE 1 TABLET BY MOUTH ONCE DAILY Patient taking differently: TAKE 1 TABLET BY MOUTH EVERY MORNING 01/05/16   Biagio Borg, MD  amoxicillin-clavulanate (AUGMENTIN) 875-125 MG tablet Take 1 tablet by mouth 2 (two) times daily. Patient not taking: Reported on 04/05/2016 01/12/16   Tanda Rockers, MD  aspirin 81 MG tablet Take 325 mg by mouth every morning.     Historical Provider, MD  bacitracin ointment Apply 1 application topically 2 (two) times daily. Patient not taking: Reported on 04/05/2016 08/25/15   Antonietta Breach, PA-C  baclofen (LIORESAL) 10 MG tablet Take 0.5 tablets (5 mg total) by mouth 3 (three) times daily. Patient taking differently: Take 5 mg by mouth every 6 (six) hours as  needed for muscle spasms.  08/05/15   Garvin Fila, MD  benzonatate (TESSALON) 100 MG capsule Take 1 capsule (100 mg total) by mouth 2 (two) times daily as needed for cough. Patient not taking: Reported on 04/05/2016 09/06/15   Hoyt Koch, MD  Biotin 5000 MCG CAPS Take 1 capsule by mouth every morning.     Historical Provider, MD  Cetirizine HCl (ZYRTEC ALLERGY PO) Take 1 tablet by mouth at bedtime.    Historical Provider, MD  dextromethorphan-guaiFENesin [facility] DM) 30-600 MG per 12 hr tablet 1-2 every 12 hours as needed for cough Patient taking differently: 1-2 by  mouth twice a day as needed for congestion and sinus pressure 05/10/15   Tanda Rockers, MD  esomeprazole (NEXIUM) 40 MG capsule Take 1 capsule (40 mg total) by mouth daily at 12 noon. Patient taking differently: Take 40 mg by mouth daily before breakfast.  03/10/16   Biagio Borg, MD  fesoterodine (TOVIAZ) 4 MG TB24 tablet Take 1 tablet (4 mg total) by mouth daily. 04/05/16   Biagio Borg, MD  mometasone (NASONEX) 50 MCG/ACT nasal spray Place 2 sprays into the nose 2 (two) times daily. 11/12/15   Tanda Rockers, MD  mometasone-formoterol [facility]) 100-5 MCG/ACT AERO Inhale 2 puffs into the lungs 2 (two) times daily. 06/09/16   Tanda Rockers, MD  montelukast (SINGULAIR) 10 MG tablet TAKE 1 TABLET BY MOUTH AT BEDTIME 06/04/15   Tanda Rockers, MD  oxymetazoline (AFRIN) 0.05 % nasal spray Place 2 sprays into both nostrils 2 (two) times daily as needed (nasal stuffiness and nasal pressure for 5 days).     Historical Provider, MD  polyethylene glycol (MIRALAX / GLYCOLAX) packet Take 17 g by mouth daily as needed (constipation).    Historical Provider, MD  predniSONE (DELTASONE) 10 MG tablet Take  4 each am x 2 days,   2 each am x 2 days,  1 each am x 2 days and stop Patient not taking: Reported on 04/05/2016 01/12/16   Tanda Rockers, MD  ranitidine (ZANTAC) 150 MG tablet Take 150 mg by mouth at bedtime.    Historical Provider, MD  Respiratory Therapy Supplies (FLUTTER) DEVI Use as needed for cough and congestion    Historical Provider, MD  rosuvastatin (CRESTOR) 10 MG tablet Take 1 tablet (10 mg total) by mouth every morning. Patient taking differently: Take 10 mg by mouth at bedtime.  01/04/16   Biagio Borg, MD  sodium chloride (OCEAN) 0.65 % SOLN nasal spray Place 2 sprays into both nostrils as needed (dry nose).     Historical Provider, MD  spironolactone (ALDACTONE) 25 MG tablet Take 25 mg by mouth every morning.     Historical Provider, MD   BP 105/79 mmHg  Pulse 71  Temp(Src) 97.4 F (36.3 C) (Oral)  Resp  17  SpO2 100% Physical Exam Physical Exam  Nursing note and vitals reviewed. Constitutional: Well developed, well nourished, non-toxic, and in no acute distress Head: Normocephalic and atraumatic.  Mouth/Throat: Oropharynx is clear and moist.  Neck: Normal range of motion. Neck supple.  Cardiovascular: Normal rate and regular rhythm.   Pulmonary/Chest: Effort normal and breath sounds normal.  Abdominal: Soft. There is no tenderness. There is no rebound and no guarding. Minimal soft stool in rectum Musculoskeletal: Normal range of motion.  Neurological: Alert, no facial droop, fluent speech, moves all extremities symmetrically Skin: Skin is warm and dry.  Psychiatric: Cooperative  ED Course  Procedures (  including critical care time) Labs Review Labs Reviewed  CBC WITH DIFFERENTIAL/PLATELET - Abnormal; Notable for the following:    Eosinophils Absolute 1.1 (*)    All other components within normal limits  BASIC METABOLIC PANEL - Abnormal; Notable for the following:    Sodium 133 (*)    All other components within normal limits  URINALYSIS, ROUTINE W REFLEX MICROSCOPIC (NOT AT [facility]) - Abnormal; Notable for the following:    Hgb urine dipstick MODERATE (*)    All other components within normal limits  URINE MICROSCOPIC-ADD ON - Abnormal; Notable for the following:    Squamous Epithelial / LPF 0-5 (*)    All other components within normal limits    Imaging Review No results found. I have personally reviewed and evaluated these images and lab results as part of my medical decision-making.   EKG Interpretation None      MDM   Final diagnoses:  Fecal impaction (HCC)  Constipation, unspecified constipation type  Hematuria    65 year old female who presents with fecal impaction. She is pretty much disimpacted by the time of my exam. Her vital signs are within normal limits. She has a soft and nontender abdomen. Clinically I without symptoms of obstruction. She is given a  Fleet enema and subsequently has 2 large bowel movements and feels significant relief and is back to baseline. Blood work overall unremarkable. She does have blood in her urine and no symptoms of flank pain or abdominal pain. No signs of infection. Discussed for repeat urine in one week with her primary care doctor and persistent hematuria will require workup with the urologist. Strict return and follow-up instructions are reviewed. She expressed understanding of all discharge instructions, and felt comfortable to plan of care.Forde Dandy, MD 06/23/16 [phone]

## 2016-06-23 NOTE — ED Notes (Signed)
Bed: [id number] Expected date:  Expected time:  Means of arrival:  Comments: 65 yo constipation

## 2016-06-23 NOTE — ED Notes (Signed)
Patient reports relief after enema

## 2016-06-23 NOTE — Discharge Instructions (Signed)
You were noted to have blood in your urine. Please see primary care doctor in one week for repeat urine, and if persistent blood in your urine, you will need to see a urologist for further work-up.  Return without fail for worsening symptoms, including fever, severe abdominal or back pain, intractable vomiting or any other symptoms concerning to you.  Continue taking miralax two times a day as prescribed.  Fecal Impaction A fecal impaction happens when there is a large, firm amount of stool (or feces) that cannot be passed. The impacted stool is usually in the rectum, which is the lowest part of the large bowel. The impacted stool can block the colon and cause significant problems. CAUSES  The longer stool stays in the rectum, the harder it gets. Anything that slows down your bowel movements can lead to fecal impaction, such as:  Constipation. This can be a long-standing (chronic) problem or can happen suddenly (acute).  Painful conditions of the rectum, such as hemorrhoids or anal fissures. The pain of these conditions can make you try to avoid having bowel movements.  Narcotic pain-relieving medicines, such as methadone, morphine, or codeine.  Not drinking enough fluids.  Inactivity and bed rest over long periods of time.  Diseases of the brain or nervous system that damage the nerves controlling the muscles of the intestines. SIGNS AND SYMPTOMS   Lack of normal bowel movements or changes in bowel patterns.  Sense of fullness in the rectum but unable to pass stool.  Pain or cramps in the abdominal area (often after meals).  Thin, watery discharge from the rectum. DIAGNOSIS  Your health care provider may suspect that you have a fecal impaction based on your symptoms and a physical exam. This will include an exam of your rectum. Sometimes X-rays or lab testing may be needed to confirm the diagnosis and to be sure there are no other problems.  TREATMENT   Initially an impaction can  be removed manually. Using a gloved finger, your health care provider can remove hard stool from your rectum.  Medicine is sometimes needed. A suppository or enema can be given in the rectum to soften the stool, which can stimulate a bowel movement. Medicines can also be given by mouth (orally).  Though rare, surgery may be needed if the colon has torn (perforated) due to blockage. HOME CARE INSTRUCTIONS   Develop regular bowel habits. This could include getting in the habit of having a bowel movement after your morning cup of coffee or after eating. Be sure to allow yourself enough time on the toilet.  Maintain a high-fiber diet.  Drink enough fluids to keep your urine clear or pale yellow as directed by your health care provider.  Exercise regularly.  If you begin to get constipated, increase the amount of fiber in your diet. Eat plenty of fruits, vegetables, whole wheat breads, bran, oatmeal, and similar products.  Take natural fiber laxatives or other laxatives only as directed by your health care provider. SEEK MEDICAL CARE IF:   You have ongoing rectal pain.  You require enemas or suppositories more than twice a week.  You have rectal bleeding.  You have continued problems, or you develop abdominal pain.  You have thin, pencil-like stools. SEEK IMMEDIATE MEDICAL CARE IF:  You have black or tarry stools. MAKE SURE YOU:   Understand these instructions.  Will watch your condition.  Will get help right away if you are not doing well or get worse.   This information  is not intended to replace advice given to you by your health care provider. Make sure you discuss any questions you have with your health care provider.   Document Released: 08/12/2004 Document Revised: 09/10/2013 Document Reviewed: 05/27/2013 Elsevier Interactive Patient Education 2016 Elsevier Inc.  Hematuria, Adult Hematuria is blood in your urine. It can be caused by a bladder infection, kidney  infection, prostate infection, kidney stone, or cancer of your urinary tract. Infections can usually be treated with medicine, and a kidney stone usually will pass through your urine. If neither of these is the cause of your hematuria, further workup to find out the reason may be needed. It is very important that you tell your health care provider about any blood you see in your urine, even if the blood stops without treatment or happens without causing pain. Blood in your urine that happens and then stops and then happens again can be a symptom of a very serious condition. Also, pain is not a symptom in the initial stages of many urinary cancers. HOME CARE INSTRUCTIONS   Drink lots of fluid, 3-4 quarts a day. If you have been diagnosed with an infection, cranberry juice is especially recommended, in addition to large amounts of water.  Avoid caffeine, tea, and carbonated beverages because they tend to irritate the bladder.  Avoid alcohol because it may irritate the prostate.  Take all medicines as directed by your health care provider.  If you were prescribed an antibiotic medicine, finish it all even if you start to feel better.  If you have been diagnosed with a kidney stone, follow your health care provider's instructions regarding straining your urine to catch the stone.  Empty your bladder often. Avoid holding urine for long periods of time.  After a bowel movement, women should cleanse front to back. Use each tissue only once.  Empty your bladder before and after sexual intercourse if you are a female. SEEK MEDICAL CARE IF:  You develop back pain.  You have a fever.  You have a feeling of sickness in your stomach (nausea) or vomiting.  Your symptoms are not better in 3 days. Return sooner if you are getting worse. SEEK IMMEDIATE MEDICAL CARE IF:   You develop severe vomiting and are unable to keep the medicine down.  You develop severe back or abdominal pain despite taking  your medicines.  You begin passing a large amount of blood or clots in your urine.  You feel extremely weak or faint, or you pass out. MAKE SURE YOU:   Understand these instructions.  Will watch your condition.  Will get help right away if you are not doing well or get worse.   This information is not intended to replace advice given to you by your health care provider. Make sure you discuss any questions you have with your health care provider.   Document Released: 11/20/2005 Document Revised: 12/11/2014 Document Reviewed: 07/21/2013 Elsevier Interactive Patient Education [employer].

## 2016-06-23 NOTE — ED Notes (Signed)
Per EMS pt c/o constipation x several days.

## 2016-06-23 NOTE — ED Notes (Addendum)
Patient reports constipation x 2 days.  Reports this morning she was able to remove significant amounts of stool this morning and states that she feels better but almost "passed out" during event.  Denies dizziness, chest pain, shortness of breath at present.  Denies abdominal pain at present.  Patient in NAD

## 2016-07-05 ENCOUNTER — Telehealth: Payer: Self-pay

## 2016-07-05 ENCOUNTER — Other Ambulatory Visit: Payer: Self-pay | Admitting: Geriatric Medicine

## 2016-07-05 ENCOUNTER — Other Ambulatory Visit: Payer: Self-pay | Admitting: Internal Medicine

## 2016-07-05 ENCOUNTER — Other Ambulatory Visit (INDEPENDENT_AMBULATORY_CARE_PROVIDER_SITE_OTHER): Payer: Self-pay

## 2016-07-05 DIAGNOSIS — R319 Hematuria, unspecified: Secondary | ICD-10-CM

## 2016-07-05 DIAGNOSIS — N3001 Acute cystitis with hematuria: Secondary | ICD-10-CM

## 2016-07-05 LAB — URINALYSIS, ROUTINE W REFLEX MICROSCOPIC
BILIRUBIN URINE: NEGATIVE
Ketones, ur: NEGATIVE
NITRITE: NEGATIVE
Specific Gravity, Urine: <=1.005 — AB (ref 1.000–1.030)
URINE GLUCOSE: NEGATIVE
UROBILINOGEN UA: 0.2 (ref 0.0–1.0)
pH: 6 (ref 5.0–8.0)

## 2016-07-05 MED ORDER — CEPHALEXIN 500 MG PO CAPS: 500.0000 mg | ORAL_CAPSULE | Freq: Four times a day (QID) | ORAL | 0 refills | Status: DC

## 2016-07-05 MED FILL — SPIRONOLACTONE 25 MG TABLET: 25 | 30 days supply | Qty: 30 | Fill #4

## 2016-07-05 MED FILL — AMLODIPINE BESYLATE 5 MG TA: 5 | 90 days supply | Qty: 90 | Fill #2

## 2016-07-05 NOTE — Telephone Encounter (Signed)
Patient came in and said that she went to the er and she had blood in her urine. She has a hospital fu next tues. But she is still having issues and having blood and having to go more than normal. She wanted to know if you could go and send in some labs for her to have her urine tested now before her app. Please advise or follow up, Thank you.

## 2016-07-05 NOTE — Telephone Encounter (Signed)
Ok for ua and culture

## 2016-07-05 NOTE — Telephone Encounter (Signed)
Please advise, thanks.

## 2016-07-06 LAB — CULTURE, URINE COMPREHENSIVE

## 2016-07-06 MED FILL — CEPHALEXIN 500 MG CAPSULE: 500 | 10 days supply | Qty: 40 | Fill #0

## 2016-07-06 NOTE — Telephone Encounter (Signed)
Called pt no answer LMOM MD ok UA has been place in computer can go to lab to have UA check...Andrea Mason

## 2016-07-07 ENCOUNTER — Ambulatory Visit (INDEPENDENT_AMBULATORY_CARE_PROVIDER_SITE_OTHER): Payer: Self-pay | Admitting: *Deleted

## 2016-07-07 DIAGNOSIS — I6330 Cerebral infarction due to thrombosis of unspecified cerebral artery: Secondary | ICD-10-CM

## 2016-07-10 NOTE — Progress Notes (Signed)
Carelink Summary Report / Loop Recorder 

## 2016-07-11 ENCOUNTER — Ambulatory Visit: Payer: Self-pay | Admitting: Internal Medicine

## 2016-07-11 ENCOUNTER — Ambulatory Visit (INDEPENDENT_AMBULATORY_CARE_PROVIDER_SITE_OTHER): Payer: Self-pay | Admitting: Internal Medicine

## 2016-07-11 ENCOUNTER — Encounter: Payer: Self-pay | Admitting: Internal Medicine

## 2016-07-11 VITALS — BP 120/82 | HR 96 | Temp 97.8°F | Resp 20 | Wt 160.0 lb

## 2016-07-11 VITALS — BP 122/80 | HR 86 | Ht 59.0 in | Wt 159.2 lb

## 2016-07-11 DIAGNOSIS — J45991 Cough variant asthma: Secondary | ICD-10-CM

## 2016-07-11 DIAGNOSIS — K5900 Constipation, unspecified: Secondary | ICD-10-CM

## 2016-07-11 DIAGNOSIS — M25511 Pain in right shoulder: Secondary | ICD-10-CM

## 2016-07-11 DIAGNOSIS — J328 Other chronic sinusitis: Secondary | ICD-10-CM

## 2016-07-11 DIAGNOSIS — N3001 Acute cystitis with hematuria: Secondary | ICD-10-CM

## 2016-07-11 MED ORDER — CEPHALEXIN 500 MG PO CAPS: 500.0000 mg | ORAL_CAPSULE | Freq: Four times a day (QID) | ORAL | 0 refills | Status: DC

## 2016-07-11 MED ORDER — AMOXICILLIN-POT CLAVULANATE 875-125 MG PO TABS: 1.0000 | ORAL_TABLET | Freq: Two times a day (BID) | ORAL | 11 refills | Status: DC

## 2016-07-11 NOTE — Progress Notes (Signed)
Subjective:    Patient ID: Andrea Mason, female    DOB: 07-Oct-1951, 65 y.o.   MRN: [id number]  HPI  Here to f/u, hx of CVA, c/o 1-2 wks gradually worsening urinary symptoms of dysuria and freq but .Denies urinary symptoms such as urgency, flank pain, hematuria or n/v, fever, chills.  Had abnormal UA and cx last wk, but states she was not notified, did not take the antibiotic. Still with symptoms but no fever, chills.  Also seen at ER with marked constipation July 28, required enema, improved but still mild intermittent despite miralax daily  Also has intermittent sharp right shoulder and upper arm pain, mild to mod, worse only to raise the arm up, but lasts for several minutes, happens several times per day.  Currently has no insurance and wants to [employer]   Past Medical History:  Diagnosis Date  . Allergic rhinitis   . Asthma   . Depression   . DJD (degenerative joint disease), cervical   . GERD (gastroesophageal reflux disease)   . History of colonic polyps   . Hypertension   . Obesity   . 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 Stroke [facility])    Past Surgical History:  Procedure Laterality Date  . FOOT SURGERY  1998  . 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 NASAL POLYP SURGERY  07/2006   x 2 with Dr. [name]  . NASAL SINUS SURGERY  07/2006   Dr. [name]  . TUBAL LIGATION      reports that she has never smoked. She has never used smokeless tobacco. She reports that she does not drink alcohol or use drugs. family history includes Cancer in her mother; Colon cancer in her brother; Diabetes in her sister. Allergies  Allergen Reactions  . Tramadol Other (See Comments)    Almost passed out  . Ivp Dye [Iodinated Diagnostic Agents] Nausea And Vomiting and Other (See Comments)    Reaction: hot flashes  . Promethazine-Codeine Nausea And Vomiting   Current Outpatient Prescriptions on File  Prior to Visit  Medication Sig Dispense Refill  . amLODipine (NORVASC) 5 MG tablet TAKE 1 TABLET BY MOUTH ONCE DAILY (Patient taking differently: TAKE 1 TABLET BY MOUTH EVERY MORNING) 90 tablet 3  . aspirin 81 MG tablet Take 325 mg by mouth every morning.     . Biotin 5000 MCG CAPS Take 1 capsule by mouth every morning.     . Cetirizine HCl (ZYRTEC ALLERGY PO) Take 1 tablet by mouth at bedtime.    Marland Kitchen esomeprazole (NEXIUM) 40 MG capsule Take 1 capsule (40 mg total) by mouth daily at 12 noon. (Patient taking differently: Take 40 mg by mouth daily before breakfast. ) 90 capsule 3  . fesoterodine (TOVIAZ) 4 MG TB24 tablet Take 1 tablet (4 mg total) by mouth daily. 90 tablet 3  . polyethylene glycol (MIRALAX / GLYCOLAX) packet Take 17 g by mouth daily as needed (constipation).    . predniSONE (DELTASONE) 10 MG tablet Take  4 each am x 2 days,   2 each am x 2 days,  1 each am x 2 days and stop 14 tablet 0  . ranitidine (ZANTAC) 150 MG tablet Take 150 mg by mouth at bedtime.    Marland Kitchen acetaminophen (TYLENOL) 325 MG tablet 325 mg. Take per bottle as needed for pain    . albuterol (PROVENTIL HFA;VENTOLIN HFA) 108 (90 Base) MCG/ACT inhaler  Inhale 2 puffs into the lungs every 4 (four) hours as needed for wheezing or shortness of breath. 1-2 puffs every 4-6 hours as needed for wheezing/shortness of breath (Patient not taking: Reported on 07/11/2016) 1 Inhaler 2  . amoxicillin-clavulanate (AUGMENTIN) 875-125 MG tablet Take 1 tablet by mouth 2 (two) times daily. (Patient not taking: Reported on 04/05/2016) 20 tablet 0  . bacitracin ointment Apply 1 application topically 2 (two) times daily. (Patient not taking: Reported on 04/05/2016) 15 g 0  . baclofen (LIORESAL) 10 MG tablet Take 0.5 tablets (5 mg total) by mouth 3 (three) times daily. (Patient not taking: Reported on 07/11/2016) 30 each 0  . benzonatate (TESSALON) 100 MG capsule Take 1 capsule (100 mg total) by mouth 2 (two) times daily as needed for cough. (Patient not  taking: Reported on 04/05/2016) 40 capsule 0  . dextromethorphan-guaiFENesin (MUCINEX DM) 30-600 MG per 12 hr tablet 1-2 every 12 hours as needed for cough (Patient not taking: Reported on 07/11/2016) 120 tablet 11  . mometasone (NASONEX) 50 MCG/ACT nasal spray Place 2 sprays into the nose 2 (two) times daily. (Patient not taking: Reported on 07/11/2016) 51 g 3  . mometasone-formoterol (DULERA) 100-5 MCG/ACT AERO Inhale 2 puffs into the lungs 2 (two) times daily. (Patient not taking: Reported on 07/11/2016) 1 Inhaler 3  . montelukast (SINGULAIR) 10 MG tablet TAKE 1 TABLET BY MOUTH AT BEDTIME (Patient not taking: Reported on 07/11/2016) 30 tablet 5  . oxymetazoline (AFRIN) 0.05 % nasal spray Place 2 sprays into both nostrils 2 (two) times daily as needed (nasal stuffiness and nasal pressure for 5 days).     Marland Kitchen Respiratory Therapy Supplies (FLUTTER) DEVI Use as needed for cough and congestion    . rosuvastatin (CRESTOR) 10 MG tablet Take 1 tablet (10 mg total) by mouth every morning. (Patient not taking: Reported on 07/11/2016) 90 tablet 3  . sodium chloride (OCEAN) 0.65 % SOLN nasal spray Place 2 sprays into both nostrils as needed (dry nose).     Marland Kitchen spironolactone (ALDACTONE) 25 MG tablet Take 25 mg by mouth every morning.      No current facility-administered medications on file prior to visit.    Review of Systems All otherwise neg per pt     Objective:   Physical Exam BP 120/82   Pulse 96   Temp 97.8 F (36.6 C) (Oral)   Resp 20   Wt 160 lb (72.6 kg)   SpO2 95%   BMI 32.32 kg/m  VS noted,  Constitutional: Pt appears in no apparent distress HENT: Head: NCAT.  Right Ear: External ear normal.  Left Ear: External ear normal.  Eyes: . Pupils are equal, round, and reactive to light. Conjunctivae and EOM are normal Neck: Normal range of motion. Neck supple.  Cardiovascular: Normal rate and regular rhythm.   Pulmonary/Chest: Effort normal and breath sounds without rales or wheezing.  Abd:  Soft,  NT, ND, + BS Neurological: Pt is alert. Not confused , motor grossly intact Skin: Skin is warm. No rash, no LE edema Psychiatric: Pt behavior is normal. No agitation.  Right shoulder with FROM, NT, no swelling or rash, but pain with active ROM on lateral abduction    Assessment & Plan:

## 2016-07-11 NOTE — Progress Notes (Signed)
Pre visit review using our clinic review tool, if applicable. No additional management support is needed unless otherwise documented below in the visit note. 

## 2016-07-11 NOTE — Patient Instructions (Signed)
Please take all new medication as prescribed  - the antibiotic  Please continue all other medications as before, and refills have been done if requested.  Please have the pharmacy call with any other refills you may need.  Please keep your appointments with your specialists as you may have planned  Please see Dr Smith/sports medicine for your right shoulder

## 2016-07-11 NOTE — Patient Instructions (Signed)
To med calendar action plan we added today:  For nasty drainage > Augmentin 875 mg take one pill twice daily  X 10 days - take at breakfast and supper with large glass of water.  It would help reduce the usual side effects (diarrhea and yeast infections) if you ate cultured yogurt at lunch.   Please schedule a follow up visit in 3 months but call sooner if needed to see [name]

## 2016-07-11 NOTE — Assessment & Plan Note (Signed)
Mild to mod, for referal Dr Tamala Julian, sport med,  to f/u any worsening symptoms or concerns

## 2016-07-11 NOTE — Progress Notes (Signed)
Subjective:   Patient ID: Andrea Mason, female    DOB: 22-Jun-1951   MRN: [id number]  Brief patient profile:  77yobf never smoker followed by Dr Loretha Brasil for allergic rhinitis and asthma     History of Present Illness  02/05/2013 1st pulmonary eval in EPIC era baseline = completely better on qvar 80 2bid and only use rescue once a week at baseline then much worse starting 02/03/13 with cough/ congestion/ green mucus and rescue x one by time of ov at 2pm.  No resting sob, very hoarse with harsh barking cough >Augmentin rx     01/12/2016  f/u ov/Chantelle Verdi re: severe nasal congestion / never called ENT Chief Complaint  Patient presents with  . Acute Visit    Pt c/o nasal congestion and has had green nasal d/c.   no cough or sob just can't breath thru nose and now drainage is purulent/ no sinus pain or fever /toothache rec Augmentin 875 mg take one pill twice daily  X 10 days - take at breakfast and supper with large glass of water.    Prednisone 10 mg take  4 each am x 2 days,   2 each am x 2 days,  1 each am x 2 days and stop  Make sure to make appt to see [name] in about 2 weeks> could not see due to cost     04/05/2016 NP Follow up : Cough /Asthma vs VCD    she is doing well. She denies any flare for cough or wheezing. Feels that her overall breathing has been doing better. She was seen last visit with a asthmatic bronchitic flare and sinusitis.  Improved with Augmentin and prednisone taper. We reviewed all her medications organize them into a medication count with patient education. Appears that she is taking her medications correctly. 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 Follow-up    5mo rov. c/o nasal drainage w/light green mucus, occ wheezing & occ sob w/exertion  nasal drainage discolored x light green esp hs  / better while on augmentin  Breathing better though still doe /  Min need for saba   No obvious day to day or daytime variability or assoc chronic cough or cp or chest tightness,  or overt sinus or hb symptoms. No unusual exp hx or h/o childhood pna/ asthma or knowledge of premature birth.  Sleeping ok without nocturnal  or early am exacerbation  of respiratory  c/o's or need for noct saba. Also denies any obvious fluctuation of symptoms with weather or environmental changes or other aggravating or alleviating factors except as outlined above   Current Medications, Allergies, Complete Past Medical History, Past Surgical History, Family History, and Social History were reviewed in [employer] record.  ROS  The following are not active complaints unless bolded sore throat, dysphagia, dental problems, itching, sneezing,  nasal congestion or excess/ purulent secretions, ear ache,   fever, chills, sweats, unintended wt loss, classically pleuritic or exertional cp, hemoptysis,  orthopnea pnd or leg swelling, presyncope, palpitations, abdominal pain, anorexia, nausea, vomiting, diarrhea  or change in bowel or bladder habits, change in stools or urine, dysuria,hematuria,  rash, arthralgias, visual complaints, headache, numbness, weakness or ataxia or problems with walking or coordination,  change in mood/affect or memory.  Objective:   amb bf   nasal tone to voice   Wt Readings from Last 3 Encounters:  07/11/16 159 lb 3.2 oz (72.2 kg)  07/11/16 160 lb (72.6 kg)  04/05/16 163 lb (73.9 kg)    Vital signs reviewed      HEENT: nl dentition,  and oropharynx. Nl external ear canals without cough reflex R Nostril: mp discharge present assoc with moderate bilateral non-specific turbinate edema      NECK :  without JVD/Nodes/TM/ nl carotid upstrokes bilaterally   LUNGS: no acc muscle use, clear to A and P bilaterally with plm without cough on insp  or exp maneuvers   CV:  RRR  no s3 or murmur or increase in P2, no edema   ABD:  soft and nontender with nl excursion in the supine position. No bruits or organomegaly, bowel sounds nl  MS:  warm without deformities, calf tenderness, cyanosis or clubbing  SKIN: warm and dry without lesions    NEURO:  alert, approp, no deficits       Assessment & Plan:

## 2016-07-11 NOTE — Assessment & Plan Note (Signed)
Mild to mod, for antibx course,  to f/u any worsening symptoms or concerns 

## 2016-07-11 NOTE — Assessment & Plan Note (Signed)
Mild to mod on miralax, ok for dulcolox or glycerin supp prn as well

## 2016-07-12 ENCOUNTER — Encounter: Payer: Self-pay | Admitting: Internal Medicine

## 2016-07-12 ENCOUNTER — Telehealth: Payer: Self-pay | Admitting: Internal Medicine

## 2016-07-12 NOTE — Assessment & Plan Note (Signed)
CT 2012-chronic sinus disease - Referred back to Dr [name] 01/07/16 > could not see due to $ as has no insurance - 07/11/2016 placed on augmentin x 10 d cylces as part of her action plan    I had an extended discussion with the patient reviewing all relevant studies completed to date and  lasting 15 to 20 minutes of a 25 minute visit    Each maintenance medication was reviewed in detail including most importantly the difference between maintenance and prns and under what circumstances the prns are to be triggered using an action plan format that is not reflected in the computer generated alphabetically organized AVS but trather by a customized med calendar that reflects the AVS meds with confirmed 100% correlation.   Please see instructions for details which were reviewed in writing and the patient given a copy highlighting the part that I personally wrote and discussed at today's ov.

## 2016-07-12 NOTE — Telephone Encounter (Signed)
Patient states she is returning a call. 

## 2016-07-12 NOTE — Assessment & Plan Note (Signed)
-  med calendar 03/17/2013 > not using 10/24/2013 , 04/07/2014  > did not bring to clinic 10/20/14  Or 12/11/2014 > brought it 07/11/2016   - Spirometry 12/11/2014  Min abn mid flows only  - 09/07/2015  extensive coaching HFA effectiveness =    75% (Ti too short)    With med calendar and dulera 100/singulair despite ongoing issues with rhinitis and sinusitis: All goals of chronic asthma control met including optimal function and elimination of symptoms with minimal need for rescue therapy.  Contingencies discussed in full including contacting this office immediately if not controlling the symptoms using the rule of two's.

## 2016-07-17 LAB — CUP PACEART REMOTE DEVICE CHECK: Date Time Interrogation Session: 20170804230724

## 2016-07-26 NOTE — Progress Notes (Signed)
Tawana ScaleZach Smith D.O. [facility] [address] [place], [place] [address] Phone: [phone] Subjective:    I'm seeing this patient by the request  of:  Oliver BarreJames John, MD   CC: right shoulder pain   MWN:[id number]:Subjective  Andrea AreolaDeborah Mason is a 65 y.o. female coming in with complaint of Right shoulder pain. States that this seems to be more of an intermittent pain. Seems to radiate down towards the upper arm. Mild to moderate nature worse with movement especially raising the arm. Can last several minutes at one time. Had been several times per day. Patient has not tried many modalities secondary to her not having insurance. Rates the severity of pain when it occurs is 8 out of 10. States that she continues to try to do daily activities.     Past Medical History:  Diagnosis Date  . Allergic rhinitis   . Asthma   . Depression   . DJD (degenerative joint disease), cervical   . GERD (gastroesophageal reflux disease)   . History of colonic polyps   . Hypertension   . Obesity   . 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 Stroke [facility](HCC)    Past Surgical History:  Procedure Laterality Date  . FOOT SURGERY  1998  . Implantable loop recorder placement  09/16/14   MDT LINQ implanted by Dr Johney FrameAllred for cryptogenic stroke, RIO II protocol  . NASAL POLYP SURGERY  07/2006   x 2 with Dr. Annalee GentaShoemaker  . NASAL SINUS SURGERY  07/2006   Dr. Annalee GentaShoemaker  . TUBAL LIGATION     Social History   Social History  . Marital status: Widowed    Spouse name: N/A  . Number of children: 3  . Years of education: N/A   Occupational History  . cosmetic consultant     [employer]   Social History Main Topics  . Smoking status: Never Smoker  . Smokeless tobacco: Never Used  . Alcohol use No  . Drug use: No  . Sexual activity: Not Currently   Other Topics Concern  . 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   Patient lives alone.   Allergies  Allergen Reactions  . Tramadol Other (See Comments)    Almost passed out  . Ivp Dye [Iodinated Diagnostic Agents] Nausea And Vomiting and Other (See Comments)    Reaction: hot flashes  . Promethazine-Codeine Nausea And Vomiting   Family History  Problem Relation Age of Onset  . Colon cancer Brother   . Diabetes Sister   . Cancer Mother     Past medical history, social, surgical and family history all reviewed in electronic medical record.  No pertanent information unless stated regarding to the chief complaint.   Review of Systems: No headache, visual changes, nausea, vomiting, diarrhea, constipation, dizziness, abdominal pain, skin rash, fevers, chills, night sweats, weight loss, swollen lymph nodes, body aches, joint swelling, muscle aches, chest pain, shortness of breath, mood changes.   Objective  Blood pressure 130/86, pulse 84, height 4\' 11"  (1.499 m), weight 158 lb (71.7 kg), SpO2 98 %.  General: No apparent distress alert and oriented x3 mood and affect normal, dressed appropriately.  HEENT: Pupils equal, extraocular movements intact  Respiratory: Patient's speak in full sentences and does not appear short of breath  Cardiovascular: No lower extremity edema, non tender, no erythema  Skin: Warm dry intact with no signs of infection or rash on extremities or on axial skeleton.  Abdomen: Soft nontender  Neuro: Cranial nerves II through XII are intact, neurovascularly intact in all extremities with 2+ DTRs and 2+ pulses.  Lymph: No lymphadenopathy of posterior or anterior cervical chain or axillae bilaterally.  Gait normal with good balance and coordination.  MSK:  Non tender with full range of motion and good stability and symmetric strength and tone of  elbows, wrist, hip, knee and ankles bilaterally. Mild weakness of the right upper extremities secondary to patient's stroke. Shoulder: Right mild atrophy.  . Palpation is normal with no tenderness over AC joint or bicipital groove. ROM is full in all planes passively. Rotator cuff strength n4/5 compared to full on contralateral side.  signs of impingement with positive Neer and Hawkin's tests, empty can sign. Speeds and Yergason's tests normal. + labral pathology Normal scapular function observed. No painful arc and no drop arm sign. No apprehension sign  MSK US performed of: Right This study was ordered, performed, and interpreted by Charlann Boxer D.O.  Shoulder:   Supraspinatus: large near full thickness tear. Mild retraction.  Infraspinatus:  Appears normal on long and transverse views. Significant increase in Doppler flow Subscapularis:  + inter substance tear.  Teres Minor:  Appears normal on long and transverse views. AC joint:  Capsule undistended, no geyser sign. Glenohumeral Joint:  Appears normal without effusion. Glenoid Labrum:  Intact without visualized tears. Biceps Tendon:  Appears normal on long and transverse views, no fraying of tendon, tendon located in intertubercular groove, no subluxation with shoulder internal or external rotation.  Impression: rotator cuff tear.       Impression and Recommendations:     This case required medical decision making of moderate complexity.      Note: This dictation was prepared with Dragon dictation along with smaller phrase technology. Any transcriptional errors that result from this process are unintentional.

## 2016-07-27 ENCOUNTER — Encounter: Payer: Self-pay | Admitting: Family Medicine

## 2016-07-27 ENCOUNTER — Other Ambulatory Visit: Payer: Self-pay

## 2016-07-27 ENCOUNTER — Ambulatory Visit (INDEPENDENT_AMBULATORY_CARE_PROVIDER_SITE_OTHER): Payer: Self-pay | Admitting: Family Medicine

## 2016-07-27 VITALS — BP 130/86 | HR 84 | Ht 59.0 in | Wt 158.0 lb

## 2016-07-27 DIAGNOSIS — M75101 Unspecified rotator cuff tear or rupture of right shoulder, not specified as traumatic: Secondary | ICD-10-CM

## 2016-07-27 DIAGNOSIS — M25511 Pain in right shoulder: Secondary | ICD-10-CM

## 2016-07-27 NOTE — Patient Instructions (Addendum)
Good to see you.  Ice 20 minutes 2 times daily. Usually after activity and before bed. Exercises 3 times a week.  pennsaid pinkie amount topically 2 times daily as needed.  Vitamin D 2000 IU daily to help with muscle strength and with endurance.  I hope this helps  Possibly see me again in 4 weeks.

## 2016-07-27 NOTE — Assessment & Plan Note (Signed)
HEP, icing, topical NSAIDs  Avoided injection at this time.  Patient if worsening weakness may need imaging.  Unable to afford physical therapy. Patient come back again in 4 weeks for further evaluation. Discussed with patient about getting health coverage to help cover some of the pricing and healthcare that likely will need to be done.

## 2016-07-30 ENCOUNTER — Emergency Department (HOSPITAL_COMMUNITY)
Admission: EM | Admit: 2016-07-30 | Discharge: 2016-07-30 | Disposition: A | Discharge status: Home / Self Care | Payer: Self-pay | Attending: Emergency Medicine | Admitting: Emergency Medicine

## 2016-07-30 ENCOUNTER — Telehealth (HOSPITAL_COMMUNITY): Payer: Self-pay

## 2016-07-30 DIAGNOSIS — I10 Essential (primary) hypertension: Secondary | ICD-10-CM | POA: Insufficient documentation

## 2016-07-30 DIAGNOSIS — Z79899 Other long term (current) drug therapy: Secondary | ICD-10-CM | POA: Insufficient documentation

## 2016-07-30 DIAGNOSIS — J45901 Unspecified asthma with (acute) exacerbation: Secondary | ICD-10-CM | POA: Insufficient documentation

## 2016-07-30 DIAGNOSIS — Z792 Long term (current) use of antibiotics: Secondary | ICD-10-CM | POA: Insufficient documentation

## 2016-07-30 DIAGNOSIS — Z7982 Long term (current) use of aspirin: Secondary | ICD-10-CM | POA: Insufficient documentation

## 2016-07-30 DIAGNOSIS — H109 Unspecified conjunctivitis: Secondary | ICD-10-CM | POA: Insufficient documentation

## 2016-07-30 MED ORDER — ERYTHROMYCIN 5 MG/GM OP OINT
TOPICAL_OINTMENT | OPHTHALMIC | 0 refills | Status: DC
Start: 2016-07-30 — End: 2017-07-16

## 2016-07-30 NOTE — Telephone Encounter (Signed)
Pt calling for frequency of eye ointment prescribed today.  Spoke w/Dr Long frequency  twice daily x 7 days.  Pt informed.

## 2016-07-30 NOTE — ED Triage Notes (Addendum)
Pt c/o right eye redness onset this morning, states her eye was stuck shut with eye mucus. Blurred vision noticed on waking, last normal last night. Patient has ongoing clear eye drainage from allergies. Right conjunctiva is injected. Voice is hoarse.

## 2016-07-30 NOTE — Discharge Instructions (Signed)
You were seen in the ED today with an eye infection. This is most likely a viral infection. Given that you wear contact lenses I have prescribed an antibiotic ointment to apply to the eye as directed. Remove the contact lens as much as you can and wear glasses if available.   Return to the ED with any vision changes, pain with looking around the room, face pain, fever, or sudden severe headache.

## 2016-07-30 NOTE — ED Provider Notes (Signed)
Emergency Department Provider Note   I have reviewed the triage vital signs and the nursing notes.   HISTORY  Chief Complaint Eye Problem   HPI Andrea Mason is a 65 y.o. female with PMH of CVA, asthma, GERD, and contact lens use presents femur to department for evaluation of increased right eye watering, mild blurry vision, and eye discharge this morning. The patient awoke with a hoarse voice and her right eye matted shut. Since removing the eye discharge she has had mostly eye watering. She's noticed redness in the right eye. No fever. No pain with extraocular movement. No face pain or headache. No neck stiffness. Patient does wear contact lens in her right eye. Denies pain in the eye.  Past Medical History:  Diagnosis Date  . Allergic rhinitis   . Asthma   . Depression   . DJD (degenerative joint disease), cervical   . GERD (gastroesophageal reflux disease)   . History of colonic polyps   . Hypertension   . Obesity   . Primary hyperaldosteronism ([place]) 11/06/2013  . Stroke [facility])     Patient Active Problem List   Diagnosis Date Noted  . Right rotator cuff tear 07/27/2016  . Right shoulder pain 07/11/2016  . Overactive bladder 01/19/2016  . Cough variant asthma 09/07/2015  . Burn 09/06/2015  . Vocal cord dysfunction 04/05/2015  . Asthma with exacerbation 04/05/2015  . Left knee pain 03/12/2015  . Right knee pain 03/12/2015  . Cough 12/08/2014  . Skin lumps, generalized 09/08/2014  . Toe pain, right 08/06/2014  . Pre-ulcerative corn or callous 08/06/2014  . Hammertoe 08/06/2014  . Swelling of joint, ankle, right 08/06/2014  . Pansinusitis 06/02/2014  . Cerebral thrombosis with cerebral infarction ([place]) 05/26/2014  . Weakness 05/25/2014  . Right arm pain 05/25/2014  . Numbness in right leg 05/25/2014  . Numbness and tingling of right arm 05/25/2014  . Embolic stroke ([place]) [id number]  . 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 Alterations  of sensations, late effect of cerebrovascular disease 02/10/2014  . CVA (cerebral infarction) 11/25/2013  . Other and unspecified hyperlipidemia 11/24/2013  . Syncope 11/20/2013  . Primary hyperaldosteronism ([place]) 11/06/2013  . Back pain 11/06/2013  . Hypokalemia 05/06/2013  . Family history of colon cancer 05/06/2013  . Lipoma 09/03/2012  . Hyperglycemia 08/15/2012  . Constipation 01/02/2012  . Preventative health care 05/24/2011  . 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 HYPERSOMNIA WITH SLEEP APNEA UNSPECIFIED 01/26/2011  . UTI (urinary tract infection) 11/15/2010  . NASAL POLYP 07/28/2010  . FIBROIDS, UTERUS 07/27/2010  . COMPUTERIZED TOMOGRAPHY, CHEST, ABNORMAL 09/16/2009  . Seasonal and perennial allergic rhinitis 05/29/2009  . PERIPHERAL EDEMA 05/28/2009  . Mild persistent chronic asthma without complication [id number]  . LEG PAIN, LEFT 06/30/2008  . COLONIC POLYPS, HX OF 06/30/2008  . OBESITY 12/12/2007  . Depression 12/12/2007  . Essential hypertension 12/12/2007  . RHINOSINUSITIS, CHRONIC 12/12/2007  . GERD 12/12/2007  . ARTHRITIS 12/12/2007    Past Surgical History:  Procedure Laterality Date  . FOOT SURGERY  1998  . 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 NASAL POLYP SURGERY  07/2006   x 2 with Dr. [name]  . NASAL SINUS SURGERY  07/2006   Dr. [name]  . TUBAL LIGATION      Current Outpatient Rx  . Order #: [id number] Class: Historical Med  . Order #: [id number] Class: Print  . Order #:  [id number] Class: Normal  . Order #: [id number] Class: Normal  . Order #: [id number] Class: Historical Med  . Order #: [id number] Class: Print  . Order #: [id number] Class: Normal  . Order #: [id number] Class: Historical Med  . Order #: [id number] Class: Historical Med  . Order #: [id number] Class: Historical Med  . Order #: [id number] Class: Normal  . Order #: [id number] Class: Print  . Order #:  [id number] Class: Fax  . Order #: [id number] Class: Print  . Order #: [id number] Class: Print  . Order #: [id number] Class: Normal  . Order #: [id number] Class: Normal  . Order #: [id number] Class: Historical Med  . Order #: [id number] Class: Historical Med  . Order #: [id number] Class: Historical Med  . Order #: [id number] Class: Historical Med  . Order #: [id number] Class: Fax  . Order #: [id number] Class: Historical Med  . Order #: [id number] Class: Historical Med    Allergies Tramadol; Ivp dye [iodinated diagnostic agents]; and Promethazine-codeine  Family History  Problem Relation Age of Onset  . Colon cancer Brother   . Diabetes Sister   . Cancer Mother     Social History Social History  Substance Use Topics  . Smoking status: Never Smoker  . Smokeless tobacco: Never Used  . Alcohol use No    Review of Systems  Constitutional: No fever/chills Eyes: Mild blurry vision and right eye discharge.  ENT: No sore throat. Positive hoarse voice.  Cardiovascular: Denies chest pain. Respiratory: Denies shortness of breath. Gastrointestinal: No abdominal pain.  No nausea, no vomiting.  No diarrhea.  No constipation. Genitourinary: Negative for dysuria. Musculoskeletal: Negative for back pain. Skin: Negative for rash. Neurological: Negative for headaches, focal weakness or numbness.  10-point ROS otherwise negative.  ____________________________________________   PHYSICAL EXAM:  VITAL SIGNS: ED Triage Vitals  Enc Vitals Group     BP 07/30/16 0927 (!) 151/102     Pulse Rate 07/30/16 0927 87     Resp 07/30/16 0927 18     Temp 07/30/16 0932 97.8 F (36.6 C)     Temp Source 07/30/16 0932 Oral     SpO2 07/30/16 0927 96 %    Constitutional: Alert and oriented. Well appearing and in no acute distress. Eyes: Conjunctivae are slightly injected R > L. Positive watery discharge. No purulent exudate. PERRL. EOMI. Head: Atraumatic. Nose: No congestion/rhinnorhea. Mouth/Throat: Mucous membranes  are moist.  Oropharynx non-erythematous. Neck: No stridor.   Cardiovascular: Normal rate, regular rhythm. Good peripheral circulation. Grossly normal heart sounds.   Respiratory: Normal respiratory effort.  No retractions. Lungs CTAB. Gastrointestinal: Soft and nontender. No distention.  Musculoskeletal: No lower extremity tenderness nor edema. No gross deformities of extremities. Neurologic:  Normal speech and language. No gross focal neurologic deficits are appreciated.  Skin:  Skin is warm, dry and intact. No rash noted. Psychiatric: Mood and affect are normal. Speech and behavior are normal. ____________________________________________   PROCEDURES  Procedure(s) performed:   Procedures  None ____________________________________________   INITIAL IMPRESSION / ASSESSMENT AND PLAN / ED COURSE  Pertinent labs & imaging results that were available during my care of the patient were reviewed by me and considered in my medical decision making (see chart for details).  Patient presents to the ED for evaluation of right eye drainage and exudate. Exam is most consistent with viral conjunctivitis. Patient has associated hoarseness consistent with viral process. Patient doesn't wear contact lens in the right eye. Advised that she removed the lens whenever possible. Did prescribe erythromycin ointment with instruction to begin this treatment if symptoms are not improved in the next 1-2 days. She'll  follow with her primary care physician as needed. Discussed return precautions in detail.  At this time, I do not feel there is any life-threatening condition present. I have reviewed and discussed all results (EKG, imaging, lab, urine as appropriate), exam findings with patient. I have reviewed nursing notes and appropriate previous records.  I feel the patient is safe to be discharged home without further emergent workup. Discussed usual and customary return precautions. Patient and family (if  present) verbalize understanding and are comfortable with this plan.  Patient will follow-up with their primary care provider. If they do not have a primary care provider, information for follow-up has been provided to them. All questions have been answered.    ____________________________________________  FINAL CLINICAL IMPRESSION(S) / ED DIAGNOSES  Final diagnoses:  Conjunctivitis, right eye     MEDICATIONS GIVEN DURING THIS VISIT:  None  NEW OUTPATIENT MEDICATIONS STARTED DURING THIS VISIT:  New Prescriptions   ERYTHROMYCIN OPHTHALMIC OINTMENT    Place a 1/2 inch ribbon of ointment into the right lower eyelid.      Note:  This document was prepared using Dragon voice recognition software and may include unintentional dictation errors.  Nanda Quinton, MD Emergency Medicine   Margette Fast, MD 07/30/16 [phone]

## 2016-08-04 ENCOUNTER — Ambulatory Visit (INDEPENDENT_AMBULATORY_CARE_PROVIDER_SITE_OTHER): Payer: Self-pay | Admitting: Neurology

## 2016-08-04 ENCOUNTER — Encounter: Payer: Self-pay | Admitting: Neurology

## 2016-08-04 VITALS — BP 119/82 | HR 80 | Ht 59.0 in | Wt 157.2 lb

## 2016-08-04 DIAGNOSIS — I6990 Unspecified sequelae of unspecified cerebrovascular disease: Secondary | ICD-10-CM

## 2016-08-04 NOTE — Patient Instructions (Signed)
I had a long d/w patient about her remote stroke, risk for recurrent stroke/TIAs, personally independently reviewed imaging studies and stroke evaluation results and answered questions.Continue aspirin 81 mg daily  for secondary stroke prevention and maintain strict control of hypertension with blood pressure goal below 130/90, diabetes with hemoglobin A1c goal below 6.5% and lipids with LDL cholesterol goal below 70 mg/dL. I also advised the patient to eat a healthy diet with plenty of whole grains, cereals, fruits and vegetables, exercise regularly and maintain ideal body weight no routine follow-up appointment with me is necessary. She may be referred back in the future only as necessary.

## 2016-08-04 NOTE — Progress Notes (Signed)
[facility] [address] [place]. [place] [address] [phone]       OFFICE FOLLOW UP VISIT NOTE  [name] Date of Birth:  1951-01-04 Medical Record Number:  [id number]   Referring MD:  Cathlean Cower Reason for Referral:  stroke  HPI: [name] who was admitted with sudden onset of confusion and right hand weakness on 11/22/13. She was initially thought to have a syncopal event but subsequent workup including an MRI scan showed a left insular cortex and parietal infarct. Patient was admitted to [facility] long hospital and seen by the neuro hospital this. MRA of the brain which I personally reviewed shows decrease MCA branches in the area of the stroke but no large vessel proximal occlusion. Transthoracic echo showed normal ejection fraction without cardiac source of embolism. Telemetry monitoring did not reveal any obvious cardiac arrhythmias. Patient was started on aspirin for stroke prevention. Carotid Dopplers were unremarkable. Lipid profile showed total cholesterol of 80, triglycerides 53, HDL 46 and LDL 23 mg percent. Hemoglobin A1c was borderline at 6.1. Patient was transferred to inpatient rehabilitation where she stayed for a few weeks and was discharged home. She's done well since discharge. She still getting outpatient speech therapy and will finish next week. She has regained most of her speech back though she has learned to speak slowly and did greatly. She still has some word finding difficulties and word hesitancy. She has residual numbness in the right hand and has trouble cooking food but is able to do most activities for herself and is quite independent. She states her blood pressure is well controlled and it is 128/82 today. She has no prior history of strokes or TIAs. She is primary hyper or posterior known as an and follows up with her nephrologist. She has no known history of atrial fibrillation , smoking, diabetes or significant cardiac  disease. She has remote childhood history of syncopal events but nothing in recent years. Interestingly she states that following her recent stroke her accent has changed to what sounds like a [place] accent and family members find this quite amusing. Update 08/05/2015 : She returns for follow-up after last visit more than a year ago. She convinced to do well without recurrent stroke or TIA symptoms. She was seen in the emergency room in June this year with right upper extremity pain which is thought to be radicular pain she also had chest pain and that time. She is taking gabapentin which seems to help her pain. She noticed some increased stiffness in the right leg after some physical activities in her hand while washing a washing machine. She was prescribed baclofen at last visit but for unclear reason she discontinued it but she does tell me that it seemed to help her. She continues to have some intermittent speech difficulties and changes in her accent. Update 08/04/2016 :  She returns for follow-up after last visit a year ago. She can just do well without recurrent stroke or TIA symptoms now since December 2014. 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 She is tolerating aspirin well without bleeding or bruising. She states her blood pressure is well controlled and today it is 119/52. She remains on Crestor which is tolerating well without muscle aches and pains. She stated she had lipid profile checked by her primary physician several months ago and  was satisfactory. I do not have access to those records. She has no new complaints today. ROS:   14 system review of systems is positive for  runny nose, heat and cold intolerance, rectal bleeding, constipation, rectal environmental allergies, easy bruising, memory loss, numbness, speech difficulty, aching muscles,  muscle cramps, bones, hyperactivity. And all other systems negative  PMH:  Past Medical History:  Diagnosis Date  . Allergic rhinitis   . Asthma   . Depression   . DJD (degenerative joint disease), cervical   . GERD (gastroesophageal reflux disease)   . History of colonic polyps   . Hypertension   . Obesity   . 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 Stroke [facility]     Social History:  Social History   Social History  . Marital status: Widowed    Spouse name: N/A  . Number of children: 3  . Years of education: N/A   Occupational History  . cosmetic consultant     [employer]   Social History Main Topics  . Smoking status: Never Smoker  . Smokeless tobacco: Never Used  . Alcohol use 0.6 oz/week    1 Glasses of wine per week     Comment: socially  . Drug use: No  . Sexual activity: Not Currently   Other Topics Concern  . 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   Patient lives alone.    Medications:   Current Outpatient Prescriptions on File Prior to Visit  Medication Sig Dispense Refill  . acetaminophen (TYLENOL) 325 MG tablet 325 mg. Take per bottle as needed for pain    . albuterol (PROVENTIL HFA;VENTOLIN HFA) 108 (90 Base) MCG/ACT inhaler Inhale 2 puffs into the lungs every 4 (four) hours as needed for wheezing or shortness of breath. 1-2 puffs every 4-6 hours as needed for wheezing/shortness of breath 1 Inhaler 2  . amLODipine (NORVASC) 5 MG tablet TAKE 1 TABLET BY MOUTH ONCE DAILY (Patient taking differently: TAKE 1 TABLET BY MOUTH EVERY MORNING) 90 tablet 3  . aspirin 81 MG tablet Take 325 mg by mouth every morning.     . baclofen (LIORESAL) 10 MG tablet Take 0.5 tablets (5 mg total) by mouth 3 (three) times daily. 30 each 0  . Biotin 5000 MCG CAPS Take 1 capsule by mouth every morning.     . Cetirizine HCl (ZYRTEC ALLERGY  PO) Take 1 tablet by mouth at bedtime.    Marland Kitchen dextromethorphan-guaiFENesin (MUCINEX DM) 30-600 MG per 12 hr tablet 1-2 every 12 hours as needed for cough 120 tablet 11  . erythromycin ophthalmic ointment Place a 1/2 inch ribbon of ointment into the right lower eyelid. 1 g 0  . esomeprazole (NEXIUM) 40 MG capsule Take 1 capsule (40 mg total) by mouth daily at 12 noon. (Patient taking differently: Take 40 mg by mouth daily before breakfast. ) 90 capsule 3  . fesoterodine (TOVIAZ) 4 MG TB24 tablet Take 1 tablet (4 mg total) by mouth daily. 90 tablet 3  . mometasone (NASONEX) 50 MCG/ACT nasal spray Place 2 sprays into the nose 2 (two) times daily. 51 g 3  . mometasone-formoterol (DULERA) 100-5 MCG/ACT AERO Inhale 2 puffs into the lungs 2 (two) times daily. 1 Inhaler 3  . montelukast (SINGULAIR) 10 MG tablet TAKE 1 TABLET BY MOUTH AT BEDTIME 30 tablet  5  . oxymetazoline (AFRIN) 0.05 % nasal spray Place 2 sprays into both nostrils 2 (two) times daily as needed (nasal stuffiness and nasal pressure for 5 days).     . polyethylene glycol (MIRALAX / GLYCOLAX) packet Take 17 g by mouth daily as needed (constipation).    . ranitidine (ZANTAC) 150 MG tablet Take 150 mg by mouth at bedtime.    Marland Kitchen Respiratory Therapy Supplies (FLUTTER) DEVI Use as needed for cough and congestion    . rosuvastatin (CRESTOR) 10 MG tablet Take 1 tablet (10 mg total) by mouth every morning. 90 tablet 3  . sodium chloride (OCEAN) 0.65 % SOLN nasal spray Place 2 sprays into both nostrils as needed (dry nose).     Marland Kitchen spironolactone (ALDACTONE) 25 MG tablet Take 25 mg by mouth every morning.      No current facility-administered medications on file prior to visit.     Allergies:   Allergies  Allergen Reactions  . Tramadol Other (See Comments)    Almost passed out  . Ivp Dye [Iodinated Diagnostic Agents] Nausea And Vomiting and Other (See Comments)    Reaction: hot flashes  . Promethazine-Codeine Nausea And Vomiting    Physical  Exam General: middle aged  african american lady, seated, in no evident distress Head: head normocephalic and atraumatic.   Neck: supple with no carotid or supraclavicular bruits Cardiovascular: regular rate and rhythm, no murmurs Musculoskeletal: no deformity Skin:  no rash/petichiae Vascular:  Normal pulses all extremities Vitals:   08/04/16 1142  BP: 119/82  Pulse: 80    Neurologic Exam Mental Status: Awake and fully alert. Oriented to place and time. Recent and remote memory intact. Attention span, concentration and fund of knowledge appropriate. Mood and affect appropriate. Mild expressive aphasia with word finding difficulties and some disfluency. Able to speak sentences. Diminished naming animals 4 only. Good comprehension, repetition. Has foreign language accent which is [place] Cranial Nerves: Fundoscopic exam not done.  . Pupils equal, briskly reactive to light. Extraocular movements full without nystagmus. Visual fields full to confrontation. Hearing intact. Facial sensation intact. Face, tongue, palate moves normally and symmetrically.  Motor: Normal bulk and tone. Normal strength in all tested extremity muscles. Fine finger movements slightly diminished in the right hand. Sensory.: intact to touch and pinprick and vibratory sensation. Slight diminished touch pinprick sensation in the right hand alone. Coordination: Rapid alternating movements normal in all extremities. Finger-to-nose and heel-to-shin performed accurately bilaterally. Gait and Station: Arises from chair without difficulty. Stance is normal. Gait demonstrates normal stride length and balance . Able to heel, toe and tandem walk without difficulty.  Reflexes: 1+ and symmetric. Toes downgoing.      ASSESSMENT: 6 year African American lady with a left MCA branch infarct in December 123456 of embolic in etiology without definite identified source. Vascular risk factors of hypertension and hyperlipidimiae.She is doing  well    PLAN: I had a long d/w patient about her remote stroke, risk for recurrent stroke/TIAs, personally independently reviewed imaging studies and stroke evaluation results and answered questions.Continue aspirin 81 mg daily  for secondary stroke prevention and maintain strict control of hypertension with blood pressure goal below 130/90, diabetes with hemoglobin A1c goal below 6.5% and lipids with LDL cholesterol goal below 70 mg/dL. I also advised the patient to eat a healthy diet with plenty of whole grains, cereals, fruits and vegetables, exercise regularly and maintain ideal body weight no routine follow-up appointment with me is necessary. She may be referred back  in the future only as necessary.  Antony Contras, MD   Note: This document was prepared with digital dictation and possible smart phrase technology. Any transcriptional errors that result from this process are unintentional.    [facility] [place]. [place] [address] [phone]       OFFICE FOLLOW UP VISIT NOTE  [name] Date of Birth:  December 27, 1950 Medical Record Number:  [id number]   Referring MD:  Cathlean Cower Reason for Referral:  stroke  HPI: 38 year African American lady who was admitted with sudden onset of confusion and right hand weakness on 11/22/13. She was initially thought to have a syncopal event but subsequent workup including an MRI scan showed a left insular cortex and parietal infarct. Patient was admitted to [facility] long hospital and seen by the neuro hospital this. MRA of the brain which I personally reviewed shows decrease MCA branches in the area of the stroke but no large vessel proximal occlusion. Transthoracic echo showed normal ejection fraction without cardiac source of embolism. Telemetry monitoring did not reveal any obvious cardiac arrhythmias. Patient was started on aspirin for stroke prevention. Carotid Dopplers were unremarkable. Lipid profile showed total  cholesterol of 80, triglycerides 53, HDL 46 and LDL 23 mg percent. Hemoglobin A1c was borderline at 6.1. Patient was transferred to inpatient rehabilitation where she stayed for a few weeks and was discharged home. She's done well since discharge. She still getting outpatient speech therapy and will finish next week. She has regained most of her speech back though she has learned to speak slowly and did greatly. She still has some word finding difficulties and word hesitancy. She has residual numbness in the right hand and has trouble cooking food but is able to do most activities for herself and is quite independent. She states her blood pressure is well controlled and it is 128/82 today. She has no prior history of strokes or TIAs. She is primary hyper or posterior known as an and follows up with her nephrologist. She has no known history of atrial fibrillation , smoking, diabetes or significant cardiac disease. She has remote childhood history of syncopal events but nothing in recent years. Interestingly she states that following her recent stroke her accent has changed to what sounds like a [place] accent and family members find this quite amusing. Update 08/05/2015 : She returns for follow-up after last visit more than a year ago. She convinced to do well without recurrent stroke or TIA symptoms. She was seen in the emergency room in June this year with right upper extremity pain which is thought to be radicular pain she also had chest pain and that time. She is taking gabapentin which seems to help her pain. She noticed some increased stiffness in the right leg after some physical activities in her hand while washing a washing machine. She was prescribed baclofen at last visit but for unclear reason she discontinued it but she does tell me that it seemed to help her. She continues to have some intermittent speech difficulties and changes in her accent. Update 08/04/2016 : She returns for follow-up after last  visit a year ago. She continues to do well without recurrent stroke or TIA symptoms since December 2014. She still has some occasional word finding and speech difficulties particularly when tired. 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 She is tolerating aspirin well without bleeding or bruising. Her blood pressure is well controlled and today it is 119/52. She is tolerating Crestor without  myalgias or arthralgias. She states she had lipid profile checked by primary physician which is fine but I do not have access to that today. ROS:   14 system review of systems is positive for speech difficulty,  runny nose, heat and cold intolerance, rectal bleeding, constipation, rectal pain, aching muscles, muscle cramps, allergies, bruising, memory loss, numbness, hyperactivity and all other systems negative PMH:  Past Medical History:  Diagnosis Date  . Allergic rhinitis   . Asthma   . Depression   . DJD (degenerative joint disease), cervical   . GERD (gastroesophageal reflux disease)   . History of colonic polyps   . Hypertension   . Obesity   . 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 Stroke [facility])     Social History:  Social History   Social History  . Marital status: Widowed    Spouse name: N/A  . Number of children: 3  . Years of education: N/A   Occupational History  . cosmetic consultant     [employer]   Social History Main Topics  . Smoking status: Never Smoker  . Smokeless tobacco: Never Used  . Alcohol use 0.6 oz/week    1 Glasses of wine per week     Comment: socially  . Drug use: No  . Sexual activity: Not Currently   Other Topics Concern  . 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   Patient lives alone.    Medications:   Current Outpatient Prescriptions on File Prior to Visit  Medication Sig Dispense Refill  . acetaminophen  (TYLENOL) 325 MG tablet 325 mg. Take per bottle as needed for pain    . albuterol (PROVENTIL HFA;VENTOLIN HFA) 108 (90 Base) MCG/ACT inhaler Inhale 2 puffs into the lungs every 4 (four) hours as needed for wheezing or shortness of breath. 1-2 puffs every 4-6 hours as needed for wheezing/shortness of breath 1 Inhaler 2  . amLODipine (NORVASC) 5 MG tablet TAKE 1 TABLET BY MOUTH ONCE DAILY (Patient taking differently: TAKE 1 TABLET BY MOUTH EVERY MORNING) 90 tablet 3  . aspirin 81 MG tablet Take 325 mg by mouth every morning.     . baclofen (LIORESAL) 10 MG tablet Take 0.5 tablets (5 mg total) by mouth 3 (three) times daily. 30 each 0  . Biotin 5000 MCG CAPS Take 1 capsule by mouth every morning.     . Cetirizine HCl (ZYRTEC ALLERGY PO) Take 1 tablet by mouth at bedtime.    Marland Kitchen dextromethorphan-guaiFENesin (MUCINEX DM) 30-600 MG per 12 hr tablet 1-2 every 12 hours as needed for cough 120 tablet 11  . erythromycin ophthalmic ointment Place a 1/2 inch ribbon of ointment into the right lower eyelid. 1 g 0  . esomeprazole (NEXIUM) 40 MG capsule Take 1 capsule (40 mg total) by mouth daily at 12 noon. (Patient taking differently: Take 40 mg by mouth daily before breakfast. ) 90 capsule 3  . fesoterodine (TOVIAZ) 4 MG TB24 tablet Take 1 tablet (4 mg total) by mouth daily. 90 tablet 3  . mometasone (NASONEX) 50 MCG/ACT nasal spray Place 2 sprays into the nose 2 (two) times daily. 51 g 3  . mometasone-formoterol (DULERA) 100-5 MCG/ACT AERO Inhale 2 puffs into the lungs 2 (two) times daily. 1 Inhaler 3  . montelukast (SINGULAIR) 10 MG tablet TAKE 1 TABLET BY  MOUTH AT BEDTIME 30 tablet 5  . oxymetazoline (AFRIN) 0.05 % nasal spray Place 2 sprays into both nostrils 2 (two) times daily as needed (nasal stuffiness and nasal pressure for 5 days).     . polyethylene glycol (MIRALAX / GLYCOLAX) packet Take 17 g by mouth daily as needed (constipation).    . ranitidine (ZANTAC) 150 MG tablet Take 150 mg by mouth at bedtime.     Marland Kitchen Respiratory Therapy Supplies (FLUTTER) DEVI Use as needed for cough and congestion    . rosuvastatin (CRESTOR) 10 MG tablet Take 1 tablet (10 mg total) by mouth every morning. 90 tablet 3  . sodium chloride (OCEAN) 0.65 % SOLN nasal spray Place 2 sprays into both nostrils as needed (dry nose).     Marland Kitchen spironolactone (ALDACTONE) 25 MG tablet Take 25 mg by mouth every morning.      No current facility-administered medications on file prior to visit.     Allergies:   Allergies  Allergen Reactions  . Tramadol Other (See Comments)    Almost passed out  . Ivp Dye [Iodinated Diagnostic Agents] Nausea And Vomiting and Other (See Comments)    Reaction: hot flashes  . Promethazine-Codeine Nausea And Vomiting    Physical Exam General: middle aged african american lady, seated, in no evident distress Head: head normocephalic and atraumatic.   Neck: supple with no carotid or supraclavicular bruits Cardiovascular: regular rate and rhythm, no murmurs Musculoskeletal: no deformity Skin:  no rash/petichiae Vascular:  Normal pulses all extremities Vitals:   08/04/16 1142  BP: 119/82  Pulse: 80    Neurologic Exam Mental Status: Awake and fully alert. Oriented to place and time. Recent and remote memory intact. Attention span, concentration and fund of knowledge appropriate. Mood and affect appropriate. Mild expressive aphasia with word finding difficulties and some disfluency. Able to speak sentences. Diminished naming animals 4 only. Good comprehension, repetition. Has foreign language accent which is [place] Cranial Nerves: Fundoscopic exam not done.  . Pupils equal, briskly reactive to light. Extraocular movements full without nystagmus. Visual fields full to confrontation. Hearing intact. Facial sensation intact. Face, tongue, palate moves normally and symmetrically.  Motor: Normal bulk and tone. Normal strength in all tested extremity muscles. Fine finger movements slightly diminished in  the right hand. Sensory.: intact to touch and pinprick and vibratory sensation. Slight diminished touch pinprick sensation in the right hand alone. Coordination: Rapid alternating movements normal in all extremities. Finger-to-nose and heel-to-shin performed accurately bilaterally. Gait and Station: Arises from chair without difficulty. Stance is normal. Gait demonstrates normal stride length and balance . Able to heel, toe and tandem walk without difficulty.  Reflexes: 1+ and symmetric. Toes downgoing.       ASSESSMENT: [name] with a left MCA branch infarct in December 123456 of embolic in etiology without definite identified source. Vascular risk factors of hypertension and hyperlipidimia.    PLAN: I had a long d/w patient about her remote stroke, risk for recurrent stroke/TIAs, personally independently reviewed imaging studies and stroke evaluation results and answered questions.Continue aspirin 81 mg daily  for secondary stroke prevention and maintain strict control of hypertension with blood pressure goal below 130/90, diabetes with hemoglobin A1c goal below 6.5% and lipids with LDL cholesterol goal below 70 mg/dL. I also advised the patient to eat a healthy diet with plenty of whole grains, cereals, fruits and vegetables, exercise regularly and maintain ideal body weight no routine follow-up appointment with me is necessary. She may be  referred back in the future only as necessary  Antony Contras, MD   Note: This document was prepared with digital dictation and possible smart phrase technology. Any transcriptional errors that result from this process are unintentional.

## 2016-08-08 ENCOUNTER — Ambulatory Visit (INDEPENDENT_AMBULATORY_CARE_PROVIDER_SITE_OTHER): Payer: Self-pay | Admitting: *Deleted

## 2016-08-08 DIAGNOSIS — I6330 Cerebral infarction due to thrombosis of unspecified cerebral artery: Secondary | ICD-10-CM

## 2016-08-08 NOTE — Progress Notes (Signed)
Carelink Summary Report / Loop Recorder 

## 2016-08-09 MED FILL — SPIRONOLACTONE 25 MG TABLET: 25 | 30 days supply | Qty: 30 | Fill #5

## 2016-08-10 ENCOUNTER — Telehealth: Payer: Self-pay | Admitting: Internal Medicine

## 2016-08-10 MED FILL — AMOX-CLAV 875-125 MG TABLET: 875-125 | 10 days supply | Qty: 20 | Fill #0

## 2016-08-10 NOTE — Telephone Encounter (Signed)
Pt stated she finish abx that Dr. Sharyl Nimrod sent in for her but she still has frequent urinate and pressure. Dr. Melvyn Novas also send in abx for her on 07/11/16 she was wondering if that will help with these symptom that she has. Please call her back

## 2016-08-11 NOTE — Telephone Encounter (Signed)
Having taken these 2 antibiotics would make a persistent UTI very unlikely  If having persistent symptoms such as pain, fever or blood, please consider ROV, or even Sat clinic tomorrow am

## 2016-08-11 NOTE — Telephone Encounter (Signed)
Spoke to patient, questions answered.

## 2016-09-03 LAB — CUP PACEART REMOTE DEVICE CHECK: MDC IDC SESS DTM: 20170903233731

## 2016-09-03 NOTE — Progress Notes (Signed)
Carelink summary report received. Battery status OK. Normal device function. No new symptom episodes, tachy episodes, brady, or pause episodes. No new AF episodes. Monthly summary reports and ROV/PRN 

## 2016-09-05 ENCOUNTER — Ambulatory Visit (INDEPENDENT_AMBULATORY_CARE_PROVIDER_SITE_OTHER): Payer: Self-pay | Admitting: *Deleted

## 2016-09-05 DIAGNOSIS — I6330 Cerebral infarction due to thrombosis of unspecified cerebral artery: Secondary | ICD-10-CM

## 2016-09-06 NOTE — Progress Notes (Signed)
Carelink Summary Report / Loop Recorder 

## 2016-09-14 MED FILL — SPIRONOLACTONE 25 MG TABLET: 25 | 90 days supply | Qty: 90 | Fill #6

## 2016-10-02 ENCOUNTER — Telehealth: Payer: Self-pay | Admitting: Internal Medicine

## 2016-10-02 NOTE — Telephone Encounter (Signed)
Pt aware that we do not have any samples of nasonex. Pt states that the health department is currently out of samples as well, but she will try back. Nothing further needed.

## 2016-10-02 NOTE — Telephone Encounter (Signed)
[phone] pt no longer in lobby

## 2016-10-05 ENCOUNTER — Ambulatory Visit (INDEPENDENT_AMBULATORY_CARE_PROVIDER_SITE_OTHER): Payer: Self-pay | Admitting: *Deleted

## 2016-10-05 DIAGNOSIS — I6330 Cerebral infarction due to thrombosis of unspecified cerebral artery: Secondary | ICD-10-CM

## 2016-10-06 ENCOUNTER — Telehealth: Payer: Self-pay | Admitting: Internal Medicine

## 2016-10-06 MED ORDER — AMOXICILLIN-POT CLAVULANATE 875-125 MG PO TABS: 1.0000 | ORAL_TABLET | Freq: Two times a day (BID) | ORAL | 11 refills | Status: DC

## 2016-10-06 MED ORDER — NYSTATIN 100000 UNIT/ML MT SUSP: 5.0000 mL | Freq: Four times a day (QID) | OROMUCOSAL | 11 refills | Status: DC

## 2016-10-06 MED FILL — NYSTATIN 100,000 UNITS/ML S: 100000 | 6 days supply | Qty: 120 | Fill #0

## 2016-10-06 MED FILL — AMOX TR-K CLV 875-125 MG TA: 875-125 | 10 days supply | Qty: 20 | Fill #0

## 2016-10-06 NOTE — Telephone Encounter (Signed)
Pt c/o thrush- white patches on tongue and cheeks Xseveral days.  Pt uses inhalers, states she rinses mouth out well after using.  Pt requesting recs  Pt uses [facility] outpatient pharmacy  MW please advise on recs. Thanks!  Instructions  To med calendar action plan we added today:   For nasty drainage > Augmentin 875 mg take one pill twice daily  X 10 days - take at breakfast and supper with large glass of water.  It would help reduce the usual side effects (diarrhea and yeast infections) if you ate cultured yogurt at lunch.    Please schedule a follow up visit in 3 months but call sooner if needed to see [name]

## 2016-10-06 NOTE — Telephone Encounter (Signed)
mycstatin suspension  100k 5 cc qid  # 8 oz  Refill x 11

## 2016-10-06 NOTE — Telephone Encounter (Signed)
Spoke with the pt and notified of recs per MW  She verbalized understanding  Rx was sent, and I also sent augmentin per her request for green sputum, as this is part of her actions plan

## 2016-10-06 NOTE — Progress Notes (Signed)
Carelink Summary Report / Loop Recorder 

## 2016-10-11 ENCOUNTER — Ambulatory Visit: Payer: Self-pay | Admitting: Internal Medicine

## 2016-10-12 MED FILL — AMLODIPINE BESYLATE 5 MG TA: 5 | 90 days supply | Qty: 90 | Fill #3

## 2016-10-14 LAB — CUP PACEART REMOTE DEVICE CHECK
Date Time Interrogation Session: 20171004001252
MDC IDC PG IMPLANT DT: 20151014

## 2016-10-14 NOTE — Progress Notes (Signed)
Carelink summary report received. Battery status OK. Normal device function. No new symptom episodes, tachy episodes, brady, or pause episodes. No new AF episodes. Monthly summary reports and ROV/PRN 

## 2016-10-20 ENCOUNTER — Telehealth: Payer: Self-pay | Admitting: Internal Medicine

## 2016-10-20 MED ORDER — MOMETASONE FURO-FORMOTEROL FUM 100-5 MCG/ACT IN AERO: 2.0000 | INHALATION_SPRAY | Freq: Two times a day (BID) | RESPIRATORY_TRACT | 0 refills | Status: DC

## 2016-10-20 NOTE — Telephone Encounter (Signed)
Spoke with pt. She is needing samples of Dulera 100. These have been placed up front for pick up. Nothing further was needed.

## 2016-10-31 ENCOUNTER — Telehealth: Payer: Self-pay | Admitting: Internal Medicine

## 2016-10-31 MED ORDER — NYSTATIN 100000 UNIT/ML MT SUSP: 5.0000 mL | Freq: Four times a day (QID) | OROMUCOSAL | 1 refills | Status: DC

## 2016-10-31 NOTE — Telephone Encounter (Signed)
Pt was called in nystatin on 10/06/16, pt states the white stuff on her tongue improved but has returned. Pt c/o white tongue & dry mouth X 1 d.  MW please advise. Thanks.

## 2016-10-31 NOTE — Telephone Encounter (Signed)
Ok to refill x one and ov with [name] at 2 weeks to regroup / re-examine - bring all meds

## 2016-10-31 NOTE — Telephone Encounter (Signed)
Spoke with pt and advised that Nystatin refill was sent to pharmacy. Pt is driving at the moment and cannot make f/u appt. Will call back to schedule f/u with [name] Parrett

## 2016-11-04 ENCOUNTER — Ambulatory Visit (INDEPENDENT_AMBULATORY_CARE_PROVIDER_SITE_OTHER): Payer: Self-pay | Admitting: Internal Medicine

## 2016-11-04 ENCOUNTER — Encounter: Payer: Self-pay | Admitting: Internal Medicine

## 2016-11-04 VITALS — BP 124/82 | HR 72 | Temp 98.3°F | Wt 161.8 lb

## 2016-11-04 DIAGNOSIS — R350 Frequency of micturition: Secondary | ICD-10-CM

## 2016-11-04 DIAGNOSIS — N3090 Cystitis, unspecified without hematuria: Secondary | ICD-10-CM | POA: Insufficient documentation

## 2016-11-04 LAB — POC URINALSYSI DIPSTICK (AUTOMATED)
Bilirubin, UA: NEGATIVE
GLUCOSE UA: NEGATIVE
Ketones, UA: NEGATIVE
NITRITE UA: NEGATIVE
PH UA: 6.5
Spec Grav, UA: 1.02
UROBILINOGEN UA: NEGATIVE

## 2016-11-04 MED ORDER — CIPROFLOXACIN HCL 250 MG PO TABS: 250.0000 mg | ORAL_TABLET | Freq: Two times a day (BID) | ORAL | 0 refills | Status: DC

## 2016-11-04 MED ORDER — FLUCONAZOLE 150 MG PO TABS: 150.0000 mg | ORAL_TABLET | Freq: Once | ORAL | 1 refills | Status: AC

## 2016-11-04 NOTE — Progress Notes (Signed)
Subjective:  Patient ID: Andrea Mason, female    DOB: 13-Jun-1951  Age: 65 y.o. MRN: [id number]  CC: Urinary Frequency (dark urine--x 3 days--pt has taken OTC Tylenol with some relief) and Dysuria   Urinary Frequency   This is a new problem. The current episode started in the past 7 days. The problem occurs intermittently. The problem has been gradually worsening. The pain is mild. There has been no fever. The fever has been present for less than 1 day. There is no history of pyelonephritis. Associated symptoms include frequency. Pertinent negatives include no chills or nausea. She has tried nothing for the symptoms.  Dysuria   Associated symptoms include frequency. Pertinent negatives include no chills or nausea.   Andrea Mason presents for bladder sx's  Outpatient Medications Prior to Visit  Medication Sig Dispense Refill  . acetaminophen (TYLENOL) 325 MG tablet 325 mg. Take per bottle as needed for pain    . albuterol (PROVENTIL HFA;VENTOLIN HFA) 108 (90 Base) MCG/ACT inhaler Inhale 2 puffs into the lungs every 4 (four) hours as needed for wheezing or shortness of breath. 1-2 puffs every 4-6 hours as needed for wheezing/shortness of breath 1 Inhaler 2  . amLODipine (NORVASC) 5 MG tablet TAKE 1 TABLET BY MOUTH ONCE DAILY (Patient taking differently: TAKE 1 TABLET BY MOUTH EVERY MORNING) 90 tablet 3  . aspirin 81 MG tablet Take 325 mg by mouth every morning.     . baclofen (LIORESAL) 10 MG tablet Take 0.5 tablets (5 mg total) by mouth 3 (three) times daily. 30 each 0  . Biotin 5000 MCG CAPS Take 1 capsule by mouth every morning.     . Cetirizine HCl (ZYRTEC ALLERGY PO) Take 1 tablet by mouth at bedtime.    Marland Kitchen dextromethorphan-guaiFENesin (MUCINEX DM) 30-600 MG per 12 hr tablet 1-2 every 12 hours as needed for cough 120 tablet 11  . erythromycin ophthalmic ointment Place a 1/2 inch ribbon of ointment into the right lower eyelid. 1 g 0  . esomeprazole (NEXIUM) 40 MG capsule Take 1 capsule  (40 mg total) by mouth daily at 12 noon. (Patient taking differently: Take 40 mg by mouth daily before breakfast. ) 90 capsule 3  . fesoterodine (TOVIAZ) 4 MG TB24 tablet Take 1 tablet (4 mg total) by mouth daily. 90 tablet 3  . mometasone (NASONEX) 50 MCG/ACT nasal spray Place 2 sprays into the nose 2 (two) times daily. 51 g 3  . mometasone-formoterol (DULERA) 100-5 MCG/ACT AERO Inhale 2 puffs into the lungs 2 (two) times daily. 2 Inhaler 0  . montelukast (SINGULAIR) 10 MG tablet TAKE 1 TABLET BY MOUTH AT BEDTIME 30 tablet 5  . nystatin (MYCOSTATIN) 100000 UNIT/ML suspension Use as directed 5 mLs (500,000 Units total) in the mouth or throat 4 (four) times daily. 120 mL 1  . oxymetazoline (AFRIN) 0.05 % nasal spray Place 2 sprays into both nostrils 2 (two) times daily as needed (nasal stuffiness and nasal pressure for 5 days).     . polyethylene glycol (MIRALAX / GLYCOLAX) packet Take 17 g by mouth daily as needed (constipation).    . ranitidine (ZANTAC) 150 MG tablet Take 150 mg by mouth at bedtime.    Marland Kitchen Respiratory Therapy Supplies (FLUTTER) DEVI Use as needed for cough and congestion    . rosuvastatin (CRESTOR) 10 MG tablet Take 1 tablet (10 mg total) by mouth every morning. 90 tablet 3  . sodium chloride (OCEAN) 0.65 % SOLN nasal spray Place 2 sprays into both  nostrils as needed (dry nose).     Marland Kitchen spironolactone (ALDACTONE) 25 MG tablet Take 25 mg by mouth every morning.     Marland Kitchen amoxicillin-clavulanate (AUGMENTIN) 875-125 MG tablet Take 1 tablet by mouth 2 (two) times daily. 20 tablet 11   No facility-administered medications prior to visit.     ROS Review of Systems  Constitutional: Negative for activity change, appetite change, chills, fatigue and unexpected weight change.  HENT: Negative for congestion, mouth sores and sinus pressure.   Eyes: Negative for visual disturbance.  Respiratory: Negative for cough and chest tightness.   Gastrointestinal: Negative for abdominal pain and nausea.    Genitourinary: Positive for dysuria and frequency. Negative for difficulty urinating and vaginal pain.  Musculoskeletal: Negative for back pain and gait problem.  Skin: Negative for pallor and rash.  Neurological: Negative for dizziness, tremors, weakness, numbness and headaches.  Psychiatric/Behavioral: Negative for confusion and sleep disturbance.    Objective:  BP 124/82   Pulse 72   Temp 98.3 F (36.8 C) (Oral)   Wt 161 lb 12 oz (73.4 kg)   BMI 32.67 kg/m   BP Readings from Last 3 Encounters:  11/04/16 124/82  08/04/16 119/82  07/30/16 150/96    Wt Readings from Last 3 Encounters:  11/04/16 161 lb 12 oz (73.4 kg)  08/04/16 157 lb 3.2 oz (71.3 kg)  07/27/16 158 lb (71.7 kg)    Physical Exam  Constitutional: She appears well-developed. No distress.  HENT:  Head: Normocephalic.  Right Ear: External ear normal.  Left Ear: External ear normal.  Nose: Nose normal.  Mouth/Throat: Oropharynx is clear and moist.  Eyes: Conjunctivae are normal. Pupils are equal, round, and reactive to light. Right eye exhibits no discharge. Left eye exhibits no discharge.  Neck: Normal range of motion. Neck supple. No JVD present. No tracheal deviation present. No thyromegaly present.  Cardiovascular: Normal rate, regular rhythm and normal heart sounds.   Pulmonary/Chest: No stridor. No respiratory distress. She has no wheezes.  Abdominal: Soft. Bowel sounds are normal. She exhibits no distension and no mass. There is no tenderness. There is no rebound and no guarding.  Musculoskeletal: She exhibits no edema or tenderness.  Lymphadenopathy:    She has no cervical adenopathy.  Neurological: She displays normal reflexes. No cranial nerve deficit. She exhibits normal muscle tone. Coordination normal.  Skin: No rash noted. No erythema.  Psychiatric: She has a normal mood and affect. Her behavior is normal. Judgment and thought content normal.    Lab Results  Component Value Date   WBC 6.6  06/23/2016   HGB 12.3 06/23/2016   HCT 36.4 06/23/2016   PLT 166 06/23/2016   GLUCOSE 95 06/23/2016   CHOL 105 12/02/2014   TRIG 40.0 12/02/2014   HDL 53.10 12/02/2014   LDLCALC 44 12/02/2014   ALT 16 05/25/2015   AST 18 05/25/2015   NA 133 (L) 06/23/2016   K 4.9 06/23/2016   CL 101 06/23/2016   CREATININE 0.65 06/23/2016   BUN 13 06/23/2016   CO2 24 06/23/2016   TSH 1.47 12/02/2014   INR 1.02 05/25/2015   HGBA1C 5.9 12/02/2014    No results found.  Assessment & Plan:   Lachelle was seen today for urinary frequency and dysuria.  Diagnoses and all orders for this visit:  Frequency of urination -     POCT Urinalysis Dipstick (Automated)   I have discontinued Ms. Runnion's amoxicillin-clavulanate. I am also having her maintain her Biotin, spironolactone, oxymetazoline, ranitidine, aspirin,  sodium chloride, dextromethorphan-guaiFENesin, montelukast, baclofen, Cetirizine HCl (ZYRTEC ALLERGY PO), acetaminophen, FLUTTER, mometasone, rosuvastatin, amLODipine, albuterol, esomeprazole, polyethylene glycol, fesoterodine, erythromycin, mometasone-formoterol, and nystatin.  No orders of the defined types were placed in this encounter.    Follow-up: No Follow-up on file.  Walker Kehr, MD

## 2016-11-04 NOTE — Assessment & Plan Note (Addendum)
Cipro x 5 d and a probiotic RTC if not better Diflucan if needed Safe sex UA

## 2016-11-06 ENCOUNTER — Ambulatory Visit (INDEPENDENT_AMBULATORY_CARE_PROVIDER_SITE_OTHER): Payer: Self-pay | Admitting: *Deleted

## 2016-11-06 DIAGNOSIS — I6330 Cerebral infarction due to thrombosis of unspecified cerebral artery: Secondary | ICD-10-CM

## 2016-11-06 NOTE — Progress Notes (Signed)
Carelink Summary Report / Loop Recorder 

## 2016-11-07 ENCOUNTER — Other Ambulatory Visit: Payer: Self-pay | Admitting: Internal Medicine

## 2016-11-07 MED ORDER — MONTELUKAST SODIUM 10 MG PO TABS: 10.0000 mg | ORAL_TABLET | Freq: Every day | ORAL | 5 refills | Status: DC

## 2016-11-11 LAB — CUP PACEART REMOTE DEVICE CHECK
Implantable Pulse Generator Implant Date: 20151014
MDC IDC SESS DTM: 20171103003656

## 2016-11-11 NOTE — Progress Notes (Signed)
Carelink summary report received. Battery status OK. Normal device function. No new symptom episodes, tachy episodes, brady, or pause episodes. No new AF episodes. Monthly summary reports and ROV/PRN 

## 2016-12-05 ENCOUNTER — Ambulatory Visit (INDEPENDENT_AMBULATORY_CARE_PROVIDER_SITE_OTHER): Payer: Self-pay | Admitting: *Deleted

## 2016-12-05 DIAGNOSIS — I6330 Cerebral infarction due to thrombosis of unspecified cerebral artery: Secondary | ICD-10-CM

## 2016-12-06 NOTE — Progress Notes (Signed)
Carelink Summary Report 

## 2016-12-11 MED FILL — SPIRONOLACTONE 25 MG TABLET: 25 | 90 days supply | Qty: 90 | Fill #7

## 2016-12-14 ENCOUNTER — Other Ambulatory Visit: Payer: Self-pay | Admitting: Internal Medicine

## 2016-12-14 MED ORDER — ALBUTEROL SULFATE HFA 108 (90 BASE) MCG/ACT IN AERS: 2.0000 | INHALATION_SPRAY | RESPIRATORY_TRACT | 0 refills | Status: DC | PRN

## 2016-12-23 LAB — CUP PACEART REMOTE DEVICE CHECK
Implantable Pulse Generator Implant Date: 20151014
MDC IDC SESS DTM: 20171203010720

## 2016-12-23 NOTE — Progress Notes (Signed)
Carelink summary report received. Battery status OK. Normal device function. No new symptom episodes, tachy episodes, brady, or pause episodes. No new AF episodes. Monthly summary reports and ROV/PRN 

## 2016-12-25 ENCOUNTER — Telehealth: Payer: Self-pay | Admitting: Internal Medicine

## 2016-12-25 DIAGNOSIS — I6310 Cerebral infarction due to embolism of unspecified precerebral artery: Secondary | ICD-10-CM

## 2016-12-25 MED ORDER — MONTELUKAST SODIUM 10 MG PO TABS: 10.0000 mg | ORAL_TABLET | Freq: Every day | ORAL | 5 refills | Status: DC

## 2016-12-25 NOTE — Telephone Encounter (Signed)
Pt came by and said that her pharmacy will not get crestor anymore thur [place] but now she needs to get the Livalo ?  Can pt have this med called in.  This gets this med though [place] medication assistance program.

## 2016-12-25 NOTE — Telephone Encounter (Signed)
Spoke with the pt  She is needing rx for singulair  I have faxed this to the [facility] per her request  Nothing further needed

## 2016-12-26 MED ORDER — PITAVASTATIN CALCIUM 4 MG PO TABS: ORAL_TABLET | ORAL | 3 refills | Status: DC

## 2016-12-26 NOTE — Telephone Encounter (Signed)
faxed

## 2016-12-26 NOTE — Telephone Encounter (Signed)
Done hardcopy to Corinne - needs faxed to [employer] or pt pickup

## 2016-12-29 ENCOUNTER — Telehealth: Payer: Self-pay | Admitting: Internal Medicine

## 2016-12-29 MED ORDER — MONTELUKAST SODIUM 10 MG PO TABS: 10.0000 mg | ORAL_TABLET | Freq: Every day | ORAL | 5 refills | Status: DC

## 2016-12-29 NOTE — Telephone Encounter (Signed)
Spoke with pt, I believe the Rx was printed and not sent over electronically. I called pt and she wanted the RX to be sent to [facility]. Pt verbalized understanding and nothing further is needed.

## 2017-01-01 ENCOUNTER — Telehealth: Payer: Self-pay | Admitting: Internal Medicine

## 2017-01-02 NOTE — Telephone Encounter (Signed)
Error

## 2017-01-03 ENCOUNTER — Ambulatory Visit (INDEPENDENT_AMBULATORY_CARE_PROVIDER_SITE_OTHER): Payer: Self-pay | Admitting: *Deleted

## 2017-01-03 DIAGNOSIS — I6330 Cerebral infarction due to thrombosis of unspecified cerebral artery: Secondary | ICD-10-CM

## 2017-01-04 ENCOUNTER — Other Ambulatory Visit: Payer: Self-pay | Admitting: Internal Medicine

## 2017-01-04 MED ORDER — MOMETASONE FURO-FORMOTEROL FUM 100-5 MCG/ACT IN AERO: 2.0000 | INHALATION_SPRAY | Freq: Two times a day (BID) | RESPIRATORY_TRACT | 11 refills | Status: DC

## 2017-01-04 NOTE — Progress Notes (Signed)
Carelink Summary Report / Loop Recorder 

## 2017-01-12 MED ORDER — ROSUVASTATIN CALCIUM 20 MG PO TABS: 20.0000 mg | ORAL_TABLET | Freq: Every day | ORAL | 0 refills | Status: DC

## 2017-01-12 NOTE — Telephone Encounter (Signed)
Pt called about this, health department do not have rx that we fax over, please check and resend.   Pt also mention if she can get a writte rx for crestor too just I case she can not get the Livalo.   Please call her back

## 2017-01-12 NOTE — Telephone Encounter (Signed)
This was faxed to the pharmacy

## 2017-01-12 NOTE — Telephone Encounter (Signed)
Order placed for lipid profile. Will print 1 month supply of Crestor.

## 2017-01-12 NOTE — Addendum Note (Signed)
Addended by: Mauricio Po D on: 01/12/2017 04:10 PM   Modules accepted: Orders

## 2017-01-15 LAB — CUP PACEART REMOTE DEVICE CHECK
Implantable Pulse Generator Implant Date: 20151014
MDC IDC SESS DTM: 20180102013906

## 2017-01-16 ENCOUNTER — Other Ambulatory Visit: Payer: Self-pay | Admitting: Internal Medicine

## 2017-01-18 ENCOUNTER — Telehealth: Payer: Self-pay

## 2017-01-18 ENCOUNTER — Other Ambulatory Visit: Payer: Self-pay

## 2017-01-18 ENCOUNTER — Telehealth: Payer: Self-pay | Admitting: Internal Medicine

## 2017-01-18 MED ORDER — ROSUVASTATIN CALCIUM 20 MG PO TABS: 20.0000 mg | ORAL_TABLET | Freq: Every day | ORAL | 0 refills | Status: DC

## 2017-01-18 MED FILL — AMLODIPINE BESYLATE 5 MG TA: 5 | 90 days supply | Qty: 90 | Fill #0

## 2017-01-18 NOTE — Telephone Encounter (Signed)
Pt came by and said that health dept told her that they will no carry the Nexium but she can get the [place] instead.  She needs a new script faxed over the health department for the [place] if Dr Jenny Reichmann approves this

## 2017-01-18 NOTE — Telephone Encounter (Signed)
Talked with health dept pharm, they never recd faxed rx for livalo----I have called into pharm via phone--pharm to fill rx for patient---patient advised in person at front office desk

## 2017-01-18 NOTE — Telephone Encounter (Signed)
Patient is no longer able to get nexium thru health dept for free---they are suggesting patient use dexilant---that med the health dept can get patient for free----routing to dr Jenny Reichmann, are you ok with ordering this med for her?--ok with patient to wait for dr Jenny Reichmann to return on 2/20---please advise, thanks

## 2017-01-18 NOTE — Telephone Encounter (Signed)
I have already created another note and sent to dr Jenny Reichmann, please see other note

## 2017-01-20 MED ORDER — DEXLANSOPRAZOLE 60 MG PO CPDR: 60.0000 mg | DELAYED_RELEASE_CAPSULE | Freq: Every day | ORAL | 3 refills | Status: DC

## 2017-01-21 MED ORDER — DEXLANSOPRAZOLE 60 MG PO CPDR: 60.0000 mg | DELAYED_RELEASE_CAPSULE | Freq: Every day | ORAL | 3 refills | Status: DC

## 2017-01-21 NOTE — Addendum Note (Signed)
Addended by: Biagio Borg on: 01/21/2017 12:01 AM   Modules accepted: Orders

## 2017-01-21 NOTE — Telephone Encounter (Signed)
dexilant Printed, but not able to be signed until I return, thanks

## 2017-01-23 MED ORDER — DEXLANSOPRAZOLE 60 MG PO CPDR: 60.0000 mg | DELAYED_RELEASE_CAPSULE | Freq: Every day | ORAL | 3 refills | Status: DC

## 2017-01-23 NOTE — Addendum Note (Signed)
Addended by: Biagio Borg on: 01/23/2017 07:59 PM   Modules accepted: Orders

## 2017-01-23 NOTE — Telephone Encounter (Signed)
Done hardcopy to Vicente Males, requires fax to [facility] please

## 2017-01-24 NOTE — Telephone Encounter (Signed)
RX faxed to [facility] 

## 2017-01-26 ENCOUNTER — Telehealth: Payer: Self-pay | Admitting: *Deleted

## 2017-01-26 NOTE — Telephone Encounter (Signed)
Spoke with patient regarding need for [employer] update (delivered via manual Carelink transmission).  Patient states she is not at home near her monitor right now, but agrees to call back to the [facility] next week for assistance.  She is aware of our direct phone number.  Patient is appreciative of call and denies additional questions or concerns at this time.

## 2017-02-01 LAB — CUP PACEART REMOTE DEVICE CHECK
Date Time Interrogation Session: 20180201023759
MDC IDC PG IMPLANT DT: 20151014

## 2017-02-02 ENCOUNTER — Ambulatory Visit (INDEPENDENT_AMBULATORY_CARE_PROVIDER_SITE_OTHER): Payer: Self-pay | Admitting: *Deleted

## 2017-02-02 DIAGNOSIS — I6330 Cerebral infarction due to thrombosis of unspecified cerebral artery: Secondary | ICD-10-CM

## 2017-02-05 NOTE — Progress Notes (Signed)
Carelink Summary Report / Loop Recorder 

## 2017-02-06 NOTE — Telephone Encounter (Signed)
Spoke with patient, assisted her in sending a manual Carelink transmission for [employer] update.  Update and transmission successful, false RRT resolved.  Patient appreciative and denies additional questions or concerns at this time.

## 2017-02-15 ENCOUNTER — Other Ambulatory Visit: Payer: Self-pay | Admitting: Internal Medicine

## 2017-02-15 MED ORDER — MOMETASONE FUROATE 50 MCG/ACT NA SUSP: 2.0000 | Freq: Two times a day (BID) | NASAL | 3 refills | Status: DC

## 2017-02-16 ENCOUNTER — Other Ambulatory Visit: Payer: Self-pay | Admitting: Internal Medicine

## 2017-02-16 MED ORDER — ALBUTEROL SULFATE HFA 108 (90 BASE) MCG/ACT IN AERS: 2.0000 | INHALATION_SPRAY | RESPIRATORY_TRACT | 0 refills | Status: DC | PRN

## 2017-02-21 LAB — CUP PACEART REMOTE DEVICE CHECK
Date Time Interrogation Session: 20180303023824
MDC IDC PG IMPLANT DT: 20151014

## 2017-02-24 ENCOUNTER — Emergency Department (HOSPITAL_COMMUNITY): Payer: Self-pay

## 2017-02-24 ENCOUNTER — Encounter (HOSPITAL_COMMUNITY): Payer: Self-pay

## 2017-02-24 ENCOUNTER — Emergency Department (HOSPITAL_COMMUNITY)
Admission: EM | Admit: 2017-02-24 | Discharge: 2017-02-24 | Disposition: A | Discharge status: Home / Self Care | Payer: Self-pay | Attending: Emergency Medicine | Admitting: Emergency Medicine

## 2017-02-24 DIAGNOSIS — J45909 Unspecified asthma, uncomplicated: Secondary | ICD-10-CM | POA: Insufficient documentation

## 2017-02-24 DIAGNOSIS — S91011A Laceration without foreign body, right ankle, initial encounter: Secondary | ICD-10-CM | POA: Insufficient documentation

## 2017-02-24 DIAGNOSIS — Y999 Unspecified external cause status: Secondary | ICD-10-CM | POA: Insufficient documentation

## 2017-02-24 DIAGNOSIS — S0083XA Contusion of other part of head, initial encounter: Secondary | ICD-10-CM

## 2017-02-24 DIAGNOSIS — Z7982 Long term (current) use of aspirin: Secondary | ICD-10-CM | POA: Insufficient documentation

## 2017-02-24 DIAGNOSIS — S0181XA Laceration without foreign body of other part of head, initial encounter: Secondary | ICD-10-CM

## 2017-02-24 DIAGNOSIS — S01511A Laceration without foreign body of lip, initial encounter: Secondary | ICD-10-CM | POA: Insufficient documentation

## 2017-02-24 DIAGNOSIS — Y929 Unspecified place or not applicable: Secondary | ICD-10-CM | POA: Insufficient documentation

## 2017-02-24 DIAGNOSIS — Z8673 Personal history of transient ischemic attack (TIA), and cerebral infarction without residual deficits: Secondary | ICD-10-CM | POA: Insufficient documentation

## 2017-02-24 DIAGNOSIS — I10 Essential (primary) hypertension: Secondary | ICD-10-CM | POA: Insufficient documentation

## 2017-02-24 DIAGNOSIS — Y939 Activity, unspecified: Secondary | ICD-10-CM | POA: Insufficient documentation

## 2017-02-24 MED ORDER — NALOXONE HCL 2 MG/2ML IJ SOSY: 2.0000 mg | PREFILLED_SYRINGE | Freq: Once | INTRAMUSCULAR | Status: DC
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
## 2017-02-24 NOTE — Discharge Instructions (Signed)
We advise the use of Tylenol or ibuprofen for pain. Apply ice to areas of pain to limit swelling. Your cuts should heal without any issues. You may clean the area with soap and water. Avoid the use of peroxide. Swish with water after eating to prevent food particles from remaining in the laceration of your inner lip. You may return to the emergency department, as needed, for new or concerning symptoms.
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
## 2017-02-26 ENCOUNTER — Telehealth: Payer: Self-pay | Admitting: Internal Medicine

## 2017-02-26 MED ORDER — MOMETASONE FURO-FORMOTEROL FUM 100-5 MCG/ACT IN AERO: 2.0000 | INHALATION_SPRAY | Freq: Two times a day (BID) | RESPIRATORY_TRACT | 5 refills | Status: DC

## 2017-02-26 NOTE — Telephone Encounter (Signed)
No samples available of Dulera at this time  Rx called to pharm per pt request  Nothing further needed

## 2017-02-27 ENCOUNTER — Other Ambulatory Visit: Payer: Self-pay | Admitting: Internal Medicine

## 2017-02-27 ENCOUNTER — Inpatient Hospital Stay: Payer: Self-pay | Admitting: Internal Medicine

## 2017-03-05 ENCOUNTER — Ambulatory Visit (INDEPENDENT_AMBULATORY_CARE_PROVIDER_SITE_OTHER): Payer: Self-pay | Admitting: *Deleted

## 2017-03-05 DIAGNOSIS — I6330 Cerebral infarction due to thrombosis of unspecified cerebral artery: Secondary | ICD-10-CM

## 2017-03-05 NOTE — Progress Notes (Signed)
Carelink Summary Report / Loop Recorder 

## 2017-03-06 ENCOUNTER — Encounter: Payer: Self-pay | Admitting: Internal Medicine

## 2017-03-06 ENCOUNTER — Ambulatory Visit (INDEPENDENT_AMBULATORY_CARE_PROVIDER_SITE_OTHER): Payer: Self-pay | Admitting: Internal Medicine

## 2017-03-06 VITALS — BP 122/84 | HR 77 | Temp 97.7°F | Ht 59.0 in | Wt 160.0 lb

## 2017-03-06 DIAGNOSIS — I639 Cerebral infarction, unspecified: Secondary | ICD-10-CM

## 2017-03-06 DIAGNOSIS — R42 Dizziness and giddiness: Secondary | ICD-10-CM

## 2017-03-06 DIAGNOSIS — J302 Other seasonal allergic rhinitis: Secondary | ICD-10-CM

## 2017-03-06 DIAGNOSIS — E785 Hyperlipidemia, unspecified: Secondary | ICD-10-CM

## 2017-03-06 DIAGNOSIS — K2100 Gastro-esophageal reflux disease with esophagitis, without bleeding: Secondary | ICD-10-CM

## 2017-03-06 DIAGNOSIS — S0083XD Contusion of other part of head, subsequent encounter: Secondary | ICD-10-CM

## 2017-03-06 DIAGNOSIS — J3089 Other allergic rhinitis: Secondary | ICD-10-CM

## 2017-03-06 MED ORDER — MECLIZINE HCL 12.5 MG PO TABS: 12.5000 mg | ORAL_TABLET | Freq: Three times a day (TID) | ORAL | 1 refills | Status: DC | PRN

## 2017-03-06 MED ORDER — PITAVASTATIN CALCIUM 4 MG PO TABS: ORAL_TABLET | ORAL | 3 refills | Status: DC

## 2017-03-06 MED ORDER — DEXLANSOPRAZOLE 60 MG PO CPDR: 60.0000 mg | DELAYED_RELEASE_CAPSULE | Freq: Every day | ORAL | 3 refills | Status: DC

## 2017-03-06 MED FILL — SPIRONOLACTONE 25 MG TABLET: 25 | 30 days supply | Qty: 30 | Fill #8

## 2017-03-06 NOTE — Progress Notes (Signed)
Pre visit review using our clinic review tool, if applicable. No additional management support is needed unless otherwise documented below in the visit note. 

## 2017-03-06 NOTE — Patient Instructions (Addendum)
Please start the dexilant and livalo after we fax the prescription today to the [facility]. And give you the hardcopy  Please take all new medication as prescribed - the meclizine as needed for dizziness  Please continue all other medications as before, and refills have been done if requested.  Please have the pharmacy call with any other refills you may need.  Please keep your appointments with your specialists as you may have planned  Please go to the LAB in the Basement (turn left off the elevator) for the tests to be done today  You will be contacted by phone if any changes need to be made immediately.  Otherwise, you will receive a letter about your results with an explanation, but please check with MyChart first.  Please remember to sign up for MyChart if you have not done so, as this will be important to you in the future with finding out test results, communicating by private email, and scheduling acute appointments online when needed.  Please return in 6 months, or sooner if needed
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
## 2017-03-08 LAB — CUP PACEART REMOTE DEVICE CHECK
Date Time Interrogation Session: 20180402033743
Implantable Pulse Generator Implant Date: 20151014

## 2017-03-12 DIAGNOSIS — S0083XD Contusion of other part of head, subsequent encounter: Secondary | ICD-10-CM | POA: Insufficient documentation

## 2017-03-12 DIAGNOSIS — R42 Dizziness and giddiness: Secondary | ICD-10-CM | POA: Insufficient documentation

## 2017-03-12 NOTE — Assessment & Plan Note (Signed)
Stable, for statin start with Livalo asd,  to f/u any worsening symptoms or concerns

## 2017-03-12 NOTE — Assessment & Plan Note (Signed)
Mild, encouraged to restart zyrtec

## 2017-03-12 NOTE — Assessment & Plan Note (Signed)
fo rmeclizine prn,  to f/u any worsening symptoms or concerns

## 2017-03-12 NOTE — Assessment & Plan Note (Signed)
Improving, cont pain control,  to f/u any worsening symptoms or concerns

## 2017-03-12 NOTE — Assessment & Plan Note (Signed)
Ok to start dexilant,  to f/u any worsening symptoms or concerns

## 2017-04-03 ENCOUNTER — Ambulatory Visit (INDEPENDENT_AMBULATORY_CARE_PROVIDER_SITE_OTHER): Payer: Self-pay | Admitting: *Deleted

## 2017-04-03 DIAGNOSIS — I6330 Cerebral infarction due to thrombosis of unspecified cerebral artery: Secondary | ICD-10-CM

## 2017-04-04 NOTE — Progress Notes (Signed)
Carelink Summary Report / Loop Recorder 

## 2017-04-10 MED FILL — SPIRONOLACTONE 25 MG TABLET: 25 | 30 days supply | Qty: 30 | Fill #0

## 2017-04-15 LAB — CUP PACEART REMOTE DEVICE CHECK
Implantable Pulse Generator Implant Date: 20151014
MDC IDC SESS DTM: 20180502040827

## 2017-04-15 NOTE — Progress Notes (Signed)
Carelink summary report received. Battery status OK. Normal device function. No new symptom episodes, tachy episodes, brady, or pause episodes. No new AF episodes. Monthly summary reports and ROV/PRN 

## 2017-04-23 ENCOUNTER — Telehealth: Payer: Self-pay | Admitting: Internal Medicine

## 2017-04-23 MED ORDER — MOMETASONE FUROATE 50 MCG/ACT NA SUSP: 2.0000 | Freq: Two times a day (BID) | NASAL | 1 refills | Status: DC

## 2017-04-23 MED FILL — AMLODIPINE BESYLATE 5 MG TA: 5 | 90 days supply | Qty: 90 | Fill #1

## 2017-04-23 NOTE — Telephone Encounter (Signed)
Pt requesting refill on nasonex.  This has been called in to preferred pharmacy.  Nothing further needed.

## 2017-04-24 ENCOUNTER — Telehealth: Payer: Self-pay | Admitting: Internal Medicine

## 2017-04-24 NOTE — Telephone Encounter (Signed)
MW please advise that the correct dosing of the nasonex should be 2 sprays in each nostril BID.  Thanks

## 2017-04-24 NOTE — Telephone Encounter (Signed)
Called the pharmacy back and they are aware of MW recs and will get the rx ready for the pt.

## 2017-04-24 NOTE — Telephone Encounter (Signed)
Best rx = 1-2 each nostril every 12 hours  ie max dose is 2 bid, min dose is one bid

## 2017-04-24 NOTE — Telephone Encounter (Signed)
[facility] Dept., returned phone call..ert

## 2017-04-24 NOTE — Telephone Encounter (Signed)
lmomtcb x 1 for the pharmacy to call back regarding the nasonex rx.

## 2017-05-03 ENCOUNTER — Ambulatory Visit (INDEPENDENT_AMBULATORY_CARE_PROVIDER_SITE_OTHER): Payer: Self-pay | Admitting: *Deleted

## 2017-05-03 DIAGNOSIS — I6330 Cerebral infarction due to thrombosis of unspecified cerebral artery: Secondary | ICD-10-CM

## 2017-05-03 NOTE — Progress Notes (Signed)
Carelink Summary Report 

## 2017-05-05 LAB — CUP PACEART REMOTE DEVICE CHECK
Date Time Interrogation Session: 20180601040924
Implantable Pulse Generator Implant Date: 20151014

## 2017-05-05 NOTE — Progress Notes (Signed)
Carelink summary report received. Battery status OK. Normal device function. No new symptom episodes, tachy episodes, brady, or pause episodes. No new AF episodes. Monthly summary reports and ROV/PRN 

## 2017-05-18 MED FILL — SPIRONOLACTONE 25 MG TABLET: 25 | 30 days supply | Qty: 30 | Fill #1

## 2017-05-18 MED FILL — AMOX TR-K CLV 875-125 MG TA: 875-125 | 10 days supply | Qty: 20 | Fill #1

## 2017-05-22 ENCOUNTER — Other Ambulatory Visit: Payer: Self-pay

## 2017-05-22 MED ORDER — DEXLANSOPRAZOLE 60 MG PO CPDR: 60.0000 mg | DELAYED_RELEASE_CAPSULE | Freq: Every day | ORAL | 3 refills | Status: DC

## 2017-05-22 NOTE — Telephone Encounter (Signed)
faxed

## 2017-05-22 NOTE — Telephone Encounter (Signed)
Done hardcopy to [place]  Needs to be faxed to Pharmacy

## 2017-06-04 ENCOUNTER — Ambulatory Visit (INDEPENDENT_AMBULATORY_CARE_PROVIDER_SITE_OTHER): Payer: Self-pay | Admitting: *Deleted

## 2017-06-04 ENCOUNTER — Telehealth: Payer: Self-pay | Admitting: Internal Medicine

## 2017-06-04 DIAGNOSIS — I6330 Cerebral infarction due to thrombosis of unspecified cerebral artery: Secondary | ICD-10-CM

## 2017-06-04 MED ORDER — MOMETASONE FURO-FORMOTEROL FUM 100-5 MCG/ACT IN AERO: 2.0000 | INHALATION_SPRAY | Freq: Two times a day (BID) | RESPIRATORY_TRACT | 0 refills | Status: DC

## 2017-06-04 NOTE — Telephone Encounter (Signed)
Samples have been given to the pt. Nothing further was needed. 

## 2017-06-04 NOTE — Progress Notes (Signed)
Carelink Summary Report / Loop Recorder 

## 2017-06-05 ENCOUNTER — Telehealth: Payer: Self-pay | Admitting: Internal Medicine

## 2017-06-05 MED ORDER — PREDNISONE 10 MG PO TABS: ORAL_TABLET | ORAL | 0 refills | Status: DC

## 2017-06-05 NOTE — Telephone Encounter (Signed)
Pt aware of recs.  rx sent to preferred pharmacy.  Nothing further needed.  

## 2017-06-05 NOTE — Telephone Encounter (Signed)
Pt c/o increased sob, sneezing, stuffy nose, swollen glands in neck Xseveral days.   Denies fever, chest pain.    Taking dulera, mucinex dm, nasonex, and augmentin (rx given to pt by MW in the past)- taking X3 days.  Requesting further recs.    Uses [facility].    MW please advise on further recs.  Thanks.   07/11/16 OV with MW: Instructions  To med calendar action plan we added today:   For nasty drainage > Augmentin 875 mg take one pill twice daily  X 10 days - take at breakfast and supper with large glass of water.  It would help reduce the usual side effects (diarrhea and yeast infections) if you ate cultured yogurt at lunch.    Please schedule a follow up visit in 3 months but call sooner if needed to see Andrea Mason

## 2017-06-05 NOTE — Telephone Encounter (Signed)
augmentin should do  If not improved can take Prednisone 10 mg take  4 each am x 2 days,   2 each am x 2 days,  1 each am x 2 days and stop

## 2017-06-19 ENCOUNTER — Telehealth: Payer: Self-pay | Admitting: Internal Medicine

## 2017-06-19 NOTE — Telephone Encounter (Signed)
Done. Hulen Skains 6/19 script in to the pharmacist.

## 2017-06-19 NOTE — Telephone Encounter (Signed)
Pt needs a refill of dexlansoprazole (DEXILANT) 60 MG capsule She is saying it has to be called in they say they do not have any refills for her.  [facility] Dept

## 2017-06-20 LAB — CUP PACEART REMOTE DEVICE CHECK
Implantable Pulse Generator Implant Date: 20151014
MDC IDC SESS DTM: 20180701044728

## 2017-06-21 MED FILL — SPIRONOLACTONE 25 MG TABLET: 25 | 30 days supply | Qty: 30 | Fill #2

## 2017-06-29 ENCOUNTER — Telehealth: Payer: Self-pay

## 2017-06-29 NOTE — Telephone Encounter (Signed)
[facility] Department requesting refill of Lisbeth Ply.  Please advise

## 2017-07-02 MED ORDER — FESOTERODINE FUMARATE ER 4 MG PO TB24: 4.0000 mg | ORAL_TABLET | Freq: Every day | ORAL | 3 refills | Status: DC

## 2017-07-02 NOTE — Telephone Encounter (Signed)
Ok to refill 

## 2017-07-02 NOTE — Telephone Encounter (Signed)
rx faxed

## 2017-07-03 ENCOUNTER — Ambulatory Visit (INDEPENDENT_AMBULATORY_CARE_PROVIDER_SITE_OTHER): Payer: Self-pay | Admitting: *Deleted

## 2017-07-03 DIAGNOSIS — I6330 Cerebral infarction due to thrombosis of unspecified cerebral artery: Secondary | ICD-10-CM

## 2017-07-03 NOTE — Progress Notes (Signed)
Carelink Summary Report / Loop Recorder 

## 2017-07-16 ENCOUNTER — Ambulatory Visit (INDEPENDENT_AMBULATORY_CARE_PROVIDER_SITE_OTHER): Payer: Self-pay | Admitting: Family Medicine

## 2017-07-16 ENCOUNTER — Encounter: Payer: Self-pay | Admitting: Family Medicine

## 2017-07-16 VITALS — BP 120/88 | HR 82 | Temp 98.9°F | Wt 169.0 lb

## 2017-07-16 DIAGNOSIS — M549 Dorsalgia, unspecified: Secondary | ICD-10-CM

## 2017-07-16 LAB — CUP PACEART REMOTE DEVICE CHECK
Date Time Interrogation Session: 20180731064011
MDC IDC PG IMPLANT DT: 20151014

## 2017-07-16 MED ORDER — TIZANIDINE HCL 2 MG PO TABS: 2.0000 mg | ORAL_TABLET | Freq: Four times a day (QID) | ORAL | 0 refills | Status: DC | PRN

## 2017-07-16 MED FILL — tiZANidine HCL 4 MG TABS: 4 | 7 days supply | Qty: 15 | Fill #0

## 2017-07-16 MED FILL — SPIRONOLACTONE 25 MG TABLET: 25 | 30 days supply | Qty: 30 | Fill #3

## 2017-07-16 NOTE — Patient Instructions (Signed)
Please take medication as directed and follow up with Dr. Jenny Reichmann if symptoms do not improve with treatment, worsen, or you develop new symptoms such as increasing pain, fever, or shortness of breath.   Back Pain, Adult Back pain is very common. The pain often gets better over time. The cause of back pain is usually not dangerous. Most people can learn to manage their back pain on their own. Follow these instructions at home: Watch your back pain for any changes. The following actions may help to lessen any pain you are feeling:  Stay active. Start with short walks on flat ground if you can. Try to walk farther each day.  Exercise regularly as told by your doctor. Exercise helps your back heal faster. It also helps avoid future injury by keeping your muscles strong and flexible.  Do not sit, drive, or stand in one place for more than 30 minutes.  Do not stay in bed. Resting more than 1-2 days can slow down your recovery.  Be careful when you bend or lift an object. Use good form when lifting: ? Bend at your knees. ? Keep the object close to your body. ? Do not twist.  Sleep on a firm mattress. Lie on your side, and bend your knees. If you lie on your back, put a pillow under your knees.  Take medicines only as told by your doctor.  Put ice on the injured area. ? Put ice in a plastic bag. ? Place a towel between your skin and the bag. ? Leave the ice on for 20 minutes, 2-3 times a day for the first 2-3 days. After that, you can switch between ice and heat packs.  Avoid feeling anxious or stressed. Find good ways to deal with stress, such as exercise.  Maintain a healthy weight. Extra weight puts stress on your back.  Contact a doctor if:  You have pain that does not go away with rest or medicine.  You have worsening pain that goes down into your legs or buttocks.  You have pain that does not get better in one week.  You have pain at night.  You lose weight.  You have a fever  or chills. Get help right away if:  You cannot control when you poop (bowel movement) or pee (urinate).  Your arms or legs feel weak.  Your arms or legs lose feeling (numbness).  You feel sick to your stomach (nauseous) or throw up (vomit).  You have belly (abdominal) pain.  You feel like you may pass out (faint). This information is not intended to replace advice given to you by your health care provider. Make sure you discuss any questions you have with your health care provider. Document Released: 05/08/2008 Document Revised: 04/27/2016 Document Reviewed: 03/24/2014 Elsevier Interactive Patient Education  [employer].

## 2017-07-16 NOTE — Progress Notes (Signed)
Subjective:    Patient ID: Andrea Mason, female    DOB: Jan 09, 1951, 66 y.o.   MRN: [id number]  HPI  BACK PAIN  Location: upper back   Quality: Rated as a 4 and noted as a "discomfort" Onset: 6 days ago Worse with: movement     Better with: rest        Radiation: No Trauma: No Best sitting/standing/leaning forward: No Started 6 days ago Treatment with either acetaminophen or ibuprofen which provided benefit. Patient does not remember which one she took. PMH: GERD   GERD is treated with dexilant, tums, and ranitidine. She reports eating "a lot of M & Ms" recently. She denies chest pain, palpitations, SOB, numbness, tingling, weakness, headaches, or edema. She denies significant dyspepsia, hoarseness, dysphagia, or extreme weight loss, melena, or rectal bleeding. She reports that BMs have been frequent yesterday. History of miralax however she reports that this causes gas. She has used a tea that is called "smooth move" and also has used a suppository which has provided benefit and increased BMs.   Red Flags Fecal/urinary incontinence: No Numbness/Weakness: No Fever/chills/sweats: No Night pain:  No Unexplained weight loss: No No relief with bedrest:  No h/o cancer/immunosuppression:  No IV drug use:  No PMH of osteoporosis or chronic steroid use: No   This information was documented on a blank document and when this was noticed, I copied and pasted this information into the correct document.   Review of Systems  Constitutional: Negative for chills, fatigue and fever.  Respiratory: Negative for cough, shortness of breath and wheezing.   Cardiovascular: Negative for chest pain, palpitations and leg swelling.  Gastrointestinal: Negative for abdominal pain, constipation, diarrhea, nausea and vomiting.  Musculoskeletal: Positive for back pain.  Skin: Negative for rash.  Psychiatric/Behavioral:       Denies depressed or anxious mood today   Past Medical History:  Diagnosis  Date  . Allergic rhinitis   . Asthma   . Depression   . DJD (degenerative joint disease), cervical   . GERD (gastroesophageal reflux disease)   . History of colonic polyps   . Hypertension   . Obesity   . 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 Stroke [facility])      Social History   Social History  . Marital status: Widowed    Spouse name: N/A  . Number of children: 3  . Years of education: N/A   Occupational History  . cosmetic consultant     [employer]   Social History Main Topics  . Smoking status: Never Smoker  . Smokeless tobacco: Never Used  . Alcohol use 0.6 oz/week    1 Glasses of wine per week     Comment: socially  . Drug use: No  . Sexual activity: Not Currently   Other Topics Concern  . 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   Patient lives alone.    Past Surgical History:  Procedure Laterality Date  . FOOT SURGERY  1998  . 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 NASAL POLYP SURGERY  07/2006   x 2 with Dr. [name]  . NASAL SINUS SURGERY  07/2006   Dr. [name]  . TUBAL LIGATION      Family History  Problem Relation Age of Onset  . Colon cancer Brother   . Stroke Brother   . Cancer Mother   . Diabetes Sister     Allergies  Allergen Reactions  . Tramadol Other (See Comments)    Almost passed out  . Ivp Dye [Iodinated Diagnostic Agents] Nausea And Vomiting and Other (See Comments)    Reaction: hot flashes  . Promethazine-Codeine Nausea And Vomiting    Current Outpatient Prescriptions on File Prior to Visit  Medication Sig Dispense Refill  . acetaminophen (TYLENOL) 325 MG tablet 325 mg. Take per bottle as needed for pain    . aspirin 81 MG tablet Take 325 mg by mouth every morning.     . baclofen (LIORESAL) 10 MG tablet  Take 0.5 tablets (5 mg total) by mouth 3 (three) times daily. (Patient taking differently: Take 5 mg by mouth as needed. ) 30 each 0  . Biotin 5000 MCG CAPS Take 1 capsule by mouth every morning.     . Cetirizine HCl (ZYRTEC ALLERGY PO) Take 1 tablet by mouth at bedtime.    Marland Kitchen dexlansoprazole (DEXILANT) 60 MG capsule Take 1 capsule (60 mg total) by mouth daily. 90 capsule 3  . dextromethorphan-guaiFENesin (MUCINEX DM) 30-600 MG per 12 hr tablet 1-2 every 12 hours as needed for cough 120 tablet 11  . fesoterodine (TOVIAZ) 4 MG TB24 tablet Take 1 tablet (4 mg total) by mouth daily. 90 tablet 3  . mometasone (NASONEX) 50 MCG/ACT nasal spray Place 2 sprays into the nose 2 (two) times daily. 17 g 1  . mometasone-formoterol (DULERA) 100-5 MCG/ACT AERO Inhale 2 puffs into the lungs 2 (two) times daily. 2 Inhaler 0  . montelukast (SINGULAIR) 10 MG tablet Take 1 tablet (10 mg total) by mouth at bedtime. 30 tablet 5  . oxymetazoline (AFRIN) 0.05 % nasal spray Place 2 sprays into both nostrils 2 (two) times daily as needed (nasal stuffiness and nasal pressure for 5 days).     . Pitavastatin Calcium (LIVALO) 4 MG TABS 1 tab by mouth daily 90 tablet 3  . polyethylene glycol (MIRALAX / GLYCOLAX) packet Take 17 g by mouth daily as needed (constipation).    . predniSONE (DELTASONE) 10 MG tablet 40mg X2 days, 20mg  X2 days, 10mg X2 days, then stop. 14 tablet 0  . ranitidine (ZANTAC) 150 MG tablet Take 150 mg by mouth at bedtime.    Marland Kitchen Respiratory Therapy Supplies (FLUTTER) DEVI Use as needed for cough and congestion    . sodium chloride (OCEAN) 0.65 % SOLN nasal spray Place 2 sprays into both nostrils as needed (dry nose).     Marland Kitchen spironolactone (ALDACTONE) 25 MG tablet Take 25 mg by mouth every morning.      No current facility-administered medications on file prior to visit.     BP 120/88 (BP Location: Left Arm, Patient Position: Sitting, Cuff Size: Normal)   Pulse 82   Temp 98.9 F (37.2 C) (Oral)   Wt 169 lb  (76.7 kg)   SpO2 98%   BMI 34.13 kg/m        Objective:   Physical Exam  Constitutional: She is oriented to person, place, and time. She appears well-developed and well-nourished.  Eyes: Pupils are equal, round, and reactive to light. No scleral icterus.  Neck: Neck supple.  Cardiovascular: Normal rate, regular rhythm and intact distal pulses.   Pulmonary/Chest: Effort normal and breath sounds normal. She has no wheezes. She has no rales.  Abdominal: Soft. Bowel sounds are normal.  She exhibits no distension. There is no tenderness.  Musculoskeletal: She exhibits no edema.  Spine with normal alignment and no deformity. No tenderness to vertebral process with palpation with the exception of mild tenderness to palpation over left trapezius.  No tenderness to paraspinous muscles. ROM is full at lumbar sacral regions. Negative Straight Leg raise. No CVA tenderness present. Able to heel/toe walk without pain.  Lymphadenopathy:    She has no cervical adenopathy.  Neurological: She is alert and oriented to person, place, and time. Coordination normal.  Skin: Skin is warm and dry. No rash noted.  Psychiatric: She has a normal mood and affect. Her behavior is normal. Judgment and thought content normal.      Assessment & Plan:  1. Upper back pain Exam is reassuring, pain is most likely musculoskeletal in nature; advised use of heat for symptom relief and use of zanaflex for muscle relaxation. Further advised follow up with PCP if symptoms do not improve with treatment, worsen, or new symptoms such as increasing pain, fever,or SOB. Recommended follow up with PCP in 2 to 3 weeks as she is due at this time.  Reviewed with patient triggers for GERD and advised avoidance of food triggers; advised continued use of medications for GERD. - tiZANidine (ZANAFLEX) 2 MG tablet; Take 1 tablet (2 mg total) by mouth every 6 (six) hours as needed for muscle spasms.  Dispense: 30 tablet; Refill: 0  Delano Metz, FNP-C

## 2017-07-24 ENCOUNTER — Telehealth: Payer: Self-pay | Admitting: Internal Medicine

## 2017-07-24 NOTE — Telephone Encounter (Signed)
Spoke with pt. She is needing a refill on Singulair. Advised her that we have not seen her in over 1 year. ROV has been scheduled for 07/25/17 at 3:45pm. Nothing further was needed.

## 2017-07-25 ENCOUNTER — Encounter: Payer: Self-pay | Admitting: Internal Medicine

## 2017-07-25 ENCOUNTER — Ambulatory Visit (INDEPENDENT_AMBULATORY_CARE_PROVIDER_SITE_OTHER): Payer: Self-pay | Admitting: Internal Medicine

## 2017-07-25 VITALS — BP 118/78 | HR 87 | Ht 61.0 in | Wt 170.2 lb

## 2017-07-25 DIAGNOSIS — J45991 Cough variant asthma: Secondary | ICD-10-CM

## 2017-07-25 MED ORDER — MONTELUKAST SODIUM 10 MG PO TABS: 10.0000 mg | ORAL_TABLET | Freq: Every day | ORAL | 11 refills | Status: DC

## 2017-07-25 NOTE — Progress Notes (Signed)
Subjective:   Patient ID: Andrea Mason, female    DOB: 03-31-1951   MRN: [id number]  Brief patient profile:  34  yobf never smoker followed by Dr Andrea Mason for allergic rhinitis and asthma     History of Present Illness  02/05/2013 1st pulmonary eval in EPIC era baseline = completely better on qvar 80 2bid and only use rescue once a week at baseline then much worse starting 02/03/13 with cough/ congestion/ green mucus and rescue x one by time of ov at 2pm.  No resting sob, very hoarse with harsh barking cough >Augmentin rx     01/12/2016  f/u ov/Andrea Mason re: severe nasal congestion / never called ENT Chief Complaint  Patient presents with  . Acute Visit    Pt c/o nasal congestion and has had green nasal d/c.   no cough or sob just can't breath thru nose and now drainage is purulent/ no sinus pain or fever /toothache rec Augmentin 875 mg take one pill twice daily  X 10 days - take at breakfast and supper with large glass of water.    Prednisone 10 mg take  4 each am x 2 days,   2 each am x 2 days,  1 each am x 2 days and stop  Make sure to make appt to see Andrea Mason in about 2 weeks> could not see due to cost     04/05/2016 NP Follow up : Cough /Asthma vs VCD    she is doing well. She denies any flare for cough or wheezing. Feels that her overall breathing has been doing better. She was seen last visit with a asthmatic bronchitic flare and sinusitis.  Improved with Augmentin and prednisone taper. We reviewed all her medications organize them into a medication count with patient education. Appears that she is taking her medications correctly. 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 Follow-up    44mo rov. c/o nasal drainage w/light green mucus, occ wheezing & occ sob w/exertion  nasal drainage discolored x light green esp  hs / better while on augmentin  Breathing better though still doe /  Min need for saba  rec To med calendar action plan we added today:  For nasty drainage > Augmentin 875 mg take one pill twice daily  X 10 days - take at breakfast and supper with large glass of water.  It would help reduce the usual side effects (diarrhea and yeast infections) if you ate cultured yogurt at lunch.    07/25/2017  f/u ov/Andrea Mason re:  Chief Complaint  Patient presents with  . Follow-up    Pt is here today due to needing a med refill and was told she needed an ov before med could be refilled.    Doing fine though not following med calendar, just here for refills   Not limited by breathing from desired activities     No obvious day to day or daytime variability or assoc excess/ purulent sputum or mucus plugs or hemoptysis or cp or chest tightness, subjective wheeze or overt sinus or hb symptoms. No unusual exp hx or h/o childhood pna/ asthma or knowledge of premature birth.  Sleeping ok without nocturnal  or early am exacerbation  of respiratory  c/o's or need for noct saba. Also denies any obvious fluctuation of symptoms with  weather or environmental changes or other aggravating or alleviating factors except as outlined above   Current Medications, Allergies, Complete Past Medical History, Past Surgical History, Family History, and Social History were reviewed in [employer] record.  ROS  The following are not active complaints unless bolded sore throat, dysphagia, dental problems, itching, sneezing,  nasal congestion or excess/ purulent secretions, ear ache,   fever, chills, sweats, unintended wt loss, classically pleuritic or exertional cp,  orthopnea pnd or leg swelling, presyncope, palpitations, abdominal pain, anorexia, nausea, vomiting, diarrhea  or change in bowel or bladder habits, change in stools or urine, dysuria,hematuria,  rash, arthralgias, visual complaints, headache, numbness,  weakness or ataxia or problems with walking or coordination,  change in mood/affect or memory.                           Objective:   amb bf   Mild nasal tone to voice     07/25/2017   07/11/16 159 lb 3.2 oz (72.2 kg)  07/11/16 160 lb (72.6 kg)  04/05/16 163 lb (73.9 kg)    Vital signs reviewed - Note on arrival 02 sats  100% on RA       HEENT: nl dentition,  and oropharynx. Nl external ear canals without cough reflex   moderate bilateral non-specific turbinate edema s purulent d/c     NECK :  without JVD/Nodes/TM/ nl carotid upstrokes bilaterally   LUNGS: no acc muscle use, completely clear to A and P bilaterally   without cough on insp or exp maneuvers   CV:  RRR  no s3 or murmur or increase in P2, no edema   ABD:  soft and nontender with nl excursion in the supine position. No bruits or organomegaly, bowel sounds nl  MS:  warm without deformities, calf tenderness, cyanosis or clubbing          Assessment & Plan:

## 2017-07-25 NOTE — Patient Instructions (Signed)
See calendar for specific medication instructions and bring it back for each and every office visit for every healthcare provider you see.  Without it,  you may not receive the best quality medical care that we feel you deserve.  You will note that the calendar groups together  your maintenance  medications that are timed at particular times of the day.  Think of this as your checklist for what your doctor has instructed you to do until your next evaluation to see what benefit  there is  to staying on a consistent group of medications intended to keep you well.  The other group at the bottom is entirely up to you to use as you see fit  for specific symptoms that may arise between visits that require you to treat them on an as needed basis.  Think of this as your action plan or "what if" list.   Separating the top medications from the bottom group is fundamental to providing you adequate care going forward.     If you are satisfied with your treatment plan,  let your doctor know and he/she can either refill your medications or you can return here when your prescription runs out.     If in any way you are not 100% satisfied,  please tell us.  If 100% better, tell your friends!  Pulmonary follow up is as needed

## 2017-07-26 NOTE — Assessment & Plan Note (Signed)
-  med calendar 03/17/2013 > not using 10/24/2013 , 04/07/2014  > did not bring to clinic 10/20/14  Or 12/11/2014 > brought it 07/11/2016   - Spirometry 12/11/2014  Min abn mid flows only  - 09/07/2015  extensive coaching HFA effectiveness =    75% (Ti too short)   Despite suboptimal hfa and difficulty keeping up with med calendar/ action plan > All goals of chronic asthma control met including optimal function and elimination of symptoms with minimal need for rescue therapy.  Contingencies discussed in full including contacting this office immediately if not controlling the symptoms using the rule of two's.     I had an extended discussion with the patient reviewing all relevant studies completed to date and  lasting 10 minutes of a 15 minute visit    Each maintenance medication was reviewed in detail including most importantly the difference between maintenance and prns and under what circumstances the prns are to be triggered using an action plan format that is not reflected in the computer generated alphabetically organized AVS but trather by a customized med calendar that reflects the AVS meds with confirmed 100% correlation.   In addition, Please see AVS for unique instructions that I personally wrote and verbalized to the the pt in detail and then reviewed with pt  by my nurse highlighting any  changes in therapy recommended at today's visit to their plan of care.

## 2017-08-02 ENCOUNTER — Ambulatory Visit (INDEPENDENT_AMBULATORY_CARE_PROVIDER_SITE_OTHER): Payer: Self-pay | Admitting: *Deleted

## 2017-08-02 DIAGNOSIS — I6330 Cerebral infarction due to thrombosis of unspecified cerebral artery: Secondary | ICD-10-CM

## 2017-08-02 NOTE — Progress Notes (Signed)
Carelink Summary Report / Loop Recorder 

## 2017-08-03 LAB — CUP PACEART REMOTE DEVICE CHECK
Implantable Pulse Generator Implant Date: 20151014
MDC IDC SESS DTM: 20180830100946

## 2017-08-03 NOTE — Progress Notes (Signed)
Carelink summary report received. Battery status OK. Normal device function. No new symptom episodes, tachy episodes, brady, or pause episodes. No new AF episodes. Monthly summary reports and ROV/PRN 

## 2017-08-07 ENCOUNTER — Telehealth: Payer: Self-pay | Admitting: *Deleted

## 2017-08-07 ENCOUNTER — Telehealth: Payer: Self-pay | Admitting: Internal Medicine

## 2017-08-07 MED FILL — NYSTATIN 100,000 UNITS/ML S: 100000 | 3 days supply | Qty: 60 | Fill #1

## 2017-08-07 NOTE — Telephone Encounter (Signed)
Spoke with patient. She states that she has thrush in her mouth as well as some throat irritation. This has been going on since 08/03/17. She wants to know if MW can call in something for the thrush.   She wishes to use [employer]   MW, please advise. Thanks!

## 2017-08-07 NOTE — Telephone Encounter (Signed)
Spoke with patient regarding LINQ at [facility] August 02, 2017. Advised patient to schedule appointment with Dr. Rayann Heman to discuss next steps or leave LINQ implanted. Patient would like to make an appointment to discuss LINQ removal. Advised patient message sent to Lorenda Hatchet to schedule appointment with Dr. Rayann Heman. Patient verbalized understanding.  Advised patient a return kit will be sent to her home to send Care[employer] monitor back to [employer]. Patient verbalized understanding.

## 2017-08-07 NOTE — Telephone Encounter (Signed)
Will close encounter

## 2017-08-07 NOTE — Telephone Encounter (Signed)
Pt called back and said she called pharmacy and she had refills left.  No need to get a new one.-tr

## 2017-08-11 ENCOUNTER — Encounter (HOSPITAL_COMMUNITY): Payer: Self-pay

## 2017-08-11 ENCOUNTER — Emergency Department (HOSPITAL_COMMUNITY): Payer: Self-pay

## 2017-08-11 ENCOUNTER — Emergency Department (HOSPITAL_COMMUNITY)
Admission: EM | Admit: 2017-08-11 | Discharge: 2017-08-11 | Disposition: A | Discharge status: Home / Self Care | Payer: Self-pay | Attending: Emergency Medicine | Admitting: Emergency Medicine

## 2017-08-11 DIAGNOSIS — Y92009 Unspecified place in unspecified non-institutional (private) residence as the place of occurrence of the external cause: Secondary | ICD-10-CM | POA: Insufficient documentation

## 2017-08-11 DIAGNOSIS — R519 Headache, unspecified: Secondary | ICD-10-CM

## 2017-08-11 DIAGNOSIS — M25511 Pain in right shoulder: Secondary | ICD-10-CM | POA: Insufficient documentation

## 2017-08-11 DIAGNOSIS — J45909 Unspecified asthma, uncomplicated: Secondary | ICD-10-CM | POA: Insufficient documentation

## 2017-08-11 DIAGNOSIS — I10 Essential (primary) hypertension: Secondary | ICD-10-CM | POA: Insufficient documentation

## 2017-08-11 DIAGNOSIS — S0990XA Unspecified injury of head, initial encounter: Secondary | ICD-10-CM | POA: Insufficient documentation

## 2017-08-11 DIAGNOSIS — Y999 Unspecified external cause status: Secondary | ICD-10-CM | POA: Insufficient documentation

## 2017-08-11 DIAGNOSIS — M25562 Pain in left knee: Secondary | ICD-10-CM | POA: Insufficient documentation

## 2017-08-11 DIAGNOSIS — R51 Headache: Secondary | ICD-10-CM

## 2017-08-11 DIAGNOSIS — Y9383 Activity, rough housing and horseplay: Secondary | ICD-10-CM | POA: Insufficient documentation
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
## 2017-08-11 NOTE — ED Notes (Signed)
Pt reports that she has a safe place to go tonight. She states that her grandson with be picking her up and she will stay with her sister.

## 2017-08-11 NOTE — Discharge Instructions (Signed)
You were seen in the Emergency Department (ED) today for a head injury.  Based on your evaluation, you may have sustained a concussion (or bruise) to your brain.  If you had a CT scan done, it did not show any evidence of serious injury or bleeding.    Symptoms to expect from a concussion include nausea, mild to moderate headache, difficulty concentrating or sleeping, and mild lightheadedness.  These symptoms should improve over the next few days to weeks, but it may take many weeks before you feel back to normal.  Return to the emergency department or follow-up with your primary care doctor if your symptoms are not improving over this time.  Signs of a more serious head injury include vomiting, severe headache, excessive sleepiness or confusion, and weakness or numbness in your face, arms or legs.  Return immediately to the Emergency Department if you experience any of these more concerning symptoms.    Rest, avoid strenuous physical or mental activity, and avoid activities that could potentially result in another head injury until all your symptoms from this head injury are completely resolved for at least 2-3 weeks.  If you participate in sports, get cleared by your doctor or trainer before returning to play.  You may take ibuprofen or acetaminophen over the counter according to label instructions for mild headache or scalp soreness.  
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
## 2017-08-11 NOTE — ED Notes (Signed)
Bed: WA01 Expected date:  Expected time:  Means of arrival:  Comments: 

## 2017-08-20 ENCOUNTER — Encounter: Payer: Self-pay | Admitting: Internal Medicine

## 2017-08-20 NOTE — Telephone Encounter (Signed)
Appointment scheduled 09/17/17 with Dr. Rayann Heman.

## 2017-08-22 MED FILL — NYSTATIN 100,000 UNITS/ML S: 100000 | 3 days supply | Qty: 60 | Fill #2

## 2017-08-27 ENCOUNTER — Emergency Department (HOSPITAL_COMMUNITY): Payer: Disability Insurance

## 2017-08-27 ENCOUNTER — Observation Stay (HOSPITAL_COMMUNITY)
Admission: EM | Admit: 2017-08-27 | Discharge: 2017-08-28 | Disposition: A | Discharge status: Home / Self Care | Payer: Disability Insurance | Attending: Family Medicine | Admitting: Family Medicine

## 2017-08-27 ENCOUNTER — Encounter (HOSPITAL_COMMUNITY): Payer: Self-pay | Admitting: Family Medicine

## 2017-08-27 DIAGNOSIS — J45909 Unspecified asthma, uncomplicated: Secondary | ICD-10-CM | POA: Diagnosis not present

## 2017-08-27 DIAGNOSIS — E876 Hypokalemia: Secondary | ICD-10-CM | POA: Diagnosis not present

## 2017-08-27 DIAGNOSIS — F32A Depression, unspecified: Secondary | ICD-10-CM | POA: Diagnosis present

## 2017-08-27 DIAGNOSIS — Z888 Allergy status to other drugs, medicaments and biological substances status: Secondary | ICD-10-CM | POA: Insufficient documentation

## 2017-08-27 DIAGNOSIS — Z6832 Body mass index (BMI) 32.0-32.9, adult: Secondary | ICD-10-CM | POA: Diagnosis not present

## 2017-08-27 DIAGNOSIS — J324 Chronic pansinusitis: Secondary | ICD-10-CM | POA: Diagnosis not present

## 2017-08-27 DIAGNOSIS — M479 Spondylosis, unspecified: Secondary | ICD-10-CM | POA: Insufficient documentation

## 2017-08-27 DIAGNOSIS — F329 Major depressive disorder, single episode, unspecified: Secondary | ICD-10-CM | POA: Insufficient documentation

## 2017-08-27 DIAGNOSIS — D259 Leiomyoma of uterus, unspecified: Secondary | ICD-10-CM | POA: Diagnosis not present

## 2017-08-27 DIAGNOSIS — Z809 Family history of malignant neoplasm, unspecified: Secondary | ICD-10-CM | POA: Insufficient documentation

## 2017-08-27 DIAGNOSIS — Z833 Family history of diabetes mellitus: Secondary | ICD-10-CM | POA: Insufficient documentation

## 2017-08-27 DIAGNOSIS — I340 Nonrheumatic mitral (valve) insufficiency: Secondary | ICD-10-CM | POA: Diagnosis not present

## 2017-08-27 DIAGNOSIS — Z8249 Family history of ischemic heart disease and other diseases of the circulatory system: Secondary | ICD-10-CM | POA: Insufficient documentation

## 2017-08-27 DIAGNOSIS — Z800 Family history of malignant neoplasm of digestive organs: Secondary | ICD-10-CM | POA: Insufficient documentation

## 2017-08-27 DIAGNOSIS — J4530 Mild persistent asthma, uncomplicated: Secondary | ICD-10-CM | POA: Diagnosis present

## 2017-08-27 DIAGNOSIS — I10 Essential (primary) hypertension: Secondary | ICD-10-CM | POA: Diagnosis present

## 2017-08-27 DIAGNOSIS — Z823 Family history of stroke: Secondary | ICD-10-CM | POA: Insufficient documentation

## 2017-08-27 DIAGNOSIS — K5900 Constipation, unspecified: Secondary | ICD-10-CM | POA: Insufficient documentation

## 2017-08-27 DIAGNOSIS — Z79899 Other long term (current) drug therapy: Secondary | ICD-10-CM | POA: Insufficient documentation

## 2017-08-27 DIAGNOSIS — K219 Gastro-esophageal reflux disease without esophagitis: Secondary | ICD-10-CM | POA: Diagnosis not present

## 2017-08-27 DIAGNOSIS — E785 Hyperlipidemia, unspecified: Secondary | ICD-10-CM | POA: Insufficient documentation

## 2017-08-27 DIAGNOSIS — S0990XA Unspecified injury of head, initial encounter: Secondary | ICD-10-CM | POA: Insufficient documentation

## 2017-08-27 DIAGNOSIS — R55 Syncope and collapse: Principal | ICD-10-CM | POA: Diagnosis present

## 2017-08-27 DIAGNOSIS — M19011 Primary osteoarthritis, right shoulder: Secondary | ICD-10-CM | POA: Insufficient documentation

## 2017-08-27 DIAGNOSIS — R51 Headache: Secondary | ICD-10-CM | POA: Diagnosis not present

## 2017-08-27 DIAGNOSIS — Z86010 Personal history of colonic polyps: Secondary | ICD-10-CM | POA: Insufficient documentation

## 2017-08-27 DIAGNOSIS — N3281 Overactive bladder: Secondary | ICD-10-CM | POA: Insufficient documentation

## 2017-08-27 DIAGNOSIS — Z7982 Long term (current) use of aspirin: Secondary | ICD-10-CM | POA: Insufficient documentation

## 2017-08-27 DIAGNOSIS — G9389 Other specified disorders of brain: Secondary | ICD-10-CM | POA: Diagnosis not present

## 2017-08-27 DIAGNOSIS — Z885 Allergy status to narcotic agent status: Secondary | ICD-10-CM | POA: Insufficient documentation

## 2017-08-27 DIAGNOSIS — Z91041 Radiographic dye allergy status: Secondary | ICD-10-CM | POA: Insufficient documentation

## 2017-08-27 DIAGNOSIS — I69351 Hemiplegia and hemiparesis following cerebral infarction affecting right dominant side: Secondary | ICD-10-CM | POA: Insufficient documentation

## 2017-08-27 DIAGNOSIS — E669 Obesity, unspecified: Secondary | ICD-10-CM | POA: Diagnosis not present

## 2017-08-27 DIAGNOSIS — M19012 Primary osteoarthritis, left shoulder: Secondary | ICD-10-CM | POA: Insufficient documentation

## 2017-08-27 DIAGNOSIS — Z836 Family history of other diseases of the respiratory system: Secondary | ICD-10-CM | POA: Insufficient documentation

## 2017-08-27 HISTORY — DX: Syncope and collapse: R55

## 2017-08-27 HISTORY — DX: Adverse effect of unspecified anesthetic, initial encounter: T41.45XA

## 2017-08-27 LAB — CBC WITH DIFFERENTIAL/PLATELET
Basophils Absolute: 0 10*3/uL (ref 0.0–0.1)
Basophils Relative: 1 %
EOS ABS: 1.3 10*3/uL — AB (ref 0.0–0.7)
EOS PCT: 20 %
HCT: 35 % — ABNORMAL LOW (ref 36.0–46.0)
Hemoglobin: 11.6 g/dL — ABNORMAL LOW (ref 12.0–15.0)
LYMPHS ABS: 2 10*3/uL (ref 0.7–4.0)
Lymphocytes Relative: 32 %
MCH: 30.6 pg (ref 26.0–34.0)
MCHC: 33.1 g/dL (ref 30.0–36.0)
MCV: 92.3 fL (ref 78.0–100.0)
MONO ABS: 0.5 10*3/uL (ref 0.1–1.0)
MONOS PCT: 8 %
Neutro Abs: 2.5 10*3/uL (ref 1.7–7.7)
Neutrophils Relative %: 39 %
PLATELETS: 201 10*3/uL (ref 150–400)
RBC: 3.79 MIL/uL — ABNORMAL LOW (ref 3.87–5.11)
RDW: 12.8 % (ref 11.5–15.5)
WBC: 6.4 10*3/uL (ref 4.0–10.5)

## 2017-08-27 LAB — COMPREHENSIVE METABOLIC PANEL
ALK PHOS: 83 U/L (ref 38–126)
ALT: 14 U/L (ref 14–54)
AST: 19 U/L (ref 15–41)
Albumin: 3.7 g/dL (ref 3.5–5.0)
Anion gap: 4 — ABNORMAL LOW (ref 5–15)
BUN: 10 mg/dL (ref 6–20)
CHLORIDE: 106 mmol/L (ref 101–111)
CO2: 29 mmol/L (ref 22–32)
Calcium: 9.1 mg/dL (ref 8.9–10.3)
Creatinine, Ser: 0.75 mg/dL (ref 0.44–1.00)
GFR calc Af Amer: >60 mL/min (ref >60)
GFR calc non Af Amer: >60 mL/min (ref >60)
Glucose, Bld: 79 mg/dL (ref 65–99)
Potassium: 3.7 mmol/L (ref 3.5–5.1)
Sodium: 139 mmol/L (ref 135–145)
Total Bilirubin: 0.9 mg/dL (ref 0.3–1.2)
Total Protein: 7 g/dL (ref 6.5–8.1)

## 2017-08-27 LAB — ETHANOL: Alcohol, Ethyl (B): <5 mg/dL (ref <5)

## 2017-08-27 LAB — I-STAT TROPONIN, ED: TROPONIN I, POC: 0 ng/mL (ref 0.00–0.08)

## 2017-08-27 LAB — GLUCOSE, CAPILLARY: GLUCOSE-CAPILLARY: 131 mg/dL — AB (ref 65–99)

## 2017-08-27 MED ORDER — MOMETASONE FURO-FORMOTEROL FUM 100-5 MCG/ACT IN AERO
2.0000 | INHALATION_SPRAY | Freq: Two times a day (BID) | RESPIRATORY_TRACT | Status: DC
  Administered 2017-08-28: 2 via RESPIRATORY_TRACT
  Filled 2017-08-27: qty 8.8

## 2017-08-27 MED ORDER — SODIUM CHLORIDE 0.9 % IV SOLN
INTRAVENOUS | Status: DC
  Administered 2017-08-27: via INTRAVENOUS

## 2017-08-27 MED ORDER — ENOXAPARIN SODIUM 40 MG/0.4ML ~~LOC~~ SOLN
40.0000 mg | SUBCUTANEOUS | Status: DC
  Administered 2017-08-28: 40 mg via SUBCUTANEOUS
  Filled 2017-08-27: qty 0.4

## 2017-08-27 MED ORDER — CETIRIZINE HCL 10 MG PO TABS
10.0000 mg | ORAL_TABLET | Freq: Every day | ORAL | Status: DC
  Filled 2017-08-27: qty 1

## 2017-08-27 MED ORDER — FLUTICASONE PROPIONATE 50 MCG/ACT NA SUSP
1.0000 | Freq: Every day | NASAL | Status: DC
  Administered 2017-08-28: 1 via NASAL
  Filled 2017-08-27: qty 16

## 2017-08-27 MED ORDER — SODIUM CHLORIDE 0.9% FLUSH
3.0000 mL | Freq: Two times a day (BID) | INTRAVENOUS | Status: DC
  Administered 2017-08-27 – 2017-08-28 (×2): 3 mL via INTRAVENOUS

## 2017-08-27 MED ORDER — SPIRONOLACTONE 25 MG PO TABS
25.0000 mg | ORAL_TABLET | Freq: Every day | ORAL | Status: DC
  Administered 2017-08-28: 25 mg via ORAL
  Filled 2017-08-27: qty 1

## 2017-08-27 MED ORDER — ACETAMINOPHEN 325 MG PO TABS
650.0000 mg | ORAL_TABLET | Freq: Once | ORAL | Status: AC
  Administered 2017-08-27: 650 mg via ORAL
  Filled 2017-08-27: qty 2

## 2017-08-27 MED ORDER — FAMOTIDINE 20 MG PO TABS
10.0000 mg | ORAL_TABLET | Freq: Every day | ORAL | Status: DC
  Administered 2017-08-28: 10 mg via ORAL
  Filled 2017-08-27: qty 1

## 2017-08-27 MED ORDER — PRAVASTATIN SODIUM 20 MG PO TABS
20.0000 mg | ORAL_TABLET | Freq: Every day | ORAL | Status: DC
  Filled 2017-08-27: qty 1

## 2017-08-27 MED ORDER — MONTELUKAST SODIUM 10 MG PO TABS: 10.0000 mg | ORAL_TABLET | Freq: Every day | ORAL | Status: DC

## 2017-08-27 MED ORDER — ASPIRIN 325 MG PO TABS
325.0000 mg | ORAL_TABLET | Freq: Every day | ORAL | Status: DC
Start: 2017-08-28 — End: 2017-08-28
  Administered 2017-08-28: 325 mg via ORAL
  Filled 2017-08-27: qty 1

## 2017-08-27 MED ORDER — PANTOPRAZOLE SODIUM 40 MG PO TBEC
40.0000 mg | DELAYED_RELEASE_TABLET | Freq: Every day | ORAL | Status: DC
  Administered 2017-08-28: 40 mg via ORAL
  Filled 2017-08-27: qty 1

## 2017-08-27 MED ORDER — FESOTERODINE FUMARATE ER 4 MG PO TB24
4.0000 mg | ORAL_TABLET | Freq: Every day | ORAL | Status: DC
  Administered 2017-08-28: 4 mg via ORAL
  Filled 2017-08-27: qty 1

## 2017-08-27 NOTE — ED Notes (Signed)
Dr. Danford at bedside  
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
## 2017-08-27 NOTE — ED Notes (Signed)
Loop recorder interrogation completed

## 2017-08-27 NOTE — ED Notes (Signed)
Patient transported to CT 

## 2017-08-27 NOTE — ED Notes (Signed)
Attempted report x1. 

## 2017-08-27 NOTE — ED Notes (Signed)
When pt got up to bedside commode she immediately complained of RLE pain. Relieved when returned to bed.
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
## 2017-08-28 ENCOUNTER — Observation Stay (HOSPITAL_BASED_OUTPATIENT_CLINIC_OR_DEPARTMENT_OTHER): Payer: Disability Insurance

## 2017-08-28 ENCOUNTER — Other Ambulatory Visit: Payer: Self-pay | Admitting: Internal Medicine

## 2017-08-28 ENCOUNTER — Encounter (HOSPITAL_COMMUNITY): Payer: Self-pay | Admitting: General Practice

## 2017-08-28 ENCOUNTER — Other Ambulatory Visit (HOSPITAL_COMMUNITY): Payer: Self-pay

## 2017-08-28 DIAGNOSIS — I340 Nonrheumatic mitral (valve) insufficiency: Secondary | ICD-10-CM

## 2017-08-28 DIAGNOSIS — R55 Syncope and collapse: Secondary | ICD-10-CM

## 2017-08-28 LAB — GLUCOSE, CAPILLARY
GLUCOSE-CAPILLARY: 82 mg/dL (ref 65–99)
GLUCOSE-CAPILLARY: 78 mg/dL (ref 65–99)
GLUCOSE-CAPILLARY: 116 mg/dL — AB (ref 65–99)
Glucose-Capillary: 84 mg/dL (ref 65–99)

## 2017-08-28 LAB — ECHOCARDIOGRAM COMPLETE
HEIGHTINCHES: 61 in
WEIGHTICAEL: 2721.6 [oz_av]

## 2017-08-28 LAB — HIV ANTIBODY (ROUTINE TESTING W REFLEX): HIV Screen 4th Generation wRfx: NONREACTIVE

## 2017-08-28 MED FILL — SPIRONOLACTONE 25 MG TABLET: 25 | 30 days supply | Qty: 30 | Fill #4

## 2017-08-28 NOTE — Progress Notes (Signed)
Patient received from ED via bed. Patient is alert oriented x4. Vital signs are stable. Skin assessment done with another nurse  found small area of redness on left forearm. Patient given instructions about call bell and phone. Bed in low position and side rail up x2.
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
## 2017-08-28 NOTE — Progress Notes (Signed)
  Echocardiogram 2D Echocardiogram has been performed.  Andrea Mason 08/28/2017, 12:18 PM

## 2017-08-28 NOTE — Progress Notes (Signed)
Nsg Discharge Note  Admit Date:  08/27/2017 Discharge date: 08/28/2017   Candyce Churn to be D/C'd Home per MD order.  AVS completed.  Copy for chart, and copy for patient signed, and dated. Patient/caregiver able to verbalize understanding.  Discharge Medication: Allergies as of 08/28/2017      Reactions   Tramadol Other (See Comments)   Almost passed out   Ivp Dye [iodinated Diagnostic Agents] Nausea And Vomiting, Other (See Comments)   Reaction: hot flashes   Promethazine-codeine Nausea And Vomiting      Medication List    STOP taking these medications   APPLE CIDER VINEGAR PO   dextromethorphan-guaiFENesin 30-600 MG 12hr tablet Commonly known as:  MUCINEX DM   nystatin 100000 UNIT/ML suspension Commonly known as:  MYCOSTATIN     TAKE these medications   acetaminophen 325 MG tablet Commonly known as:  TYLENOL Take 325 mg by mouth every 6 (six) hours as needed for mild pain.   aspirin 325 MG tablet Take 325 mg by mouth daily.   baclofen 10 MG tablet Commonly known as:  LIORESAL Take 0.5 tablets (5 mg total) by mouth 3 (three) times daily.   Biotin 5000 MCG Caps Take 5,000 mcg by mouth daily.   dexlansoprazole 60 MG capsule Commonly known as:  DEXILANT Take 1 capsule (60 mg total) by mouth daily.   fesoterodine 4 MG Tb24 tablet Commonly known as:  TOVIAZ Take 1 tablet (4 mg total) by mouth daily.   MAG64 PO Take 1 tablet by mouth daily.   mometasone 50 MCG/ACT nasal spray Commonly known as:  NASONEX Place 2 sprays into the nose 2 (two) times daily.   mometasone-formoterol 100-5 MCG/ACT Aero Commonly known as:  DULERA Inhale 2 puffs into the lungs 2 (two) times daily.   montelukast 10 MG tablet Commonly known as:  SINGULAIR Take 1 tablet (10 mg total) by mouth at bedtime.   oxymetazoline 0.05 % nasal spray Commonly known as:  AFRIN Place 2 sprays into both nostrils 2 (two) times daily as needed (nasal stuffiness and nasal pressure for 5 days).    Pitavastatin Calcium 4 MG Tabs Commonly known as:  LIVALO 1 tab by mouth daily What changed:  how much to take  how to take this  when to take this  additional instructions   ranitidine 150 MG tablet Commonly known as:  ZANTAC Take 150 mg by mouth at bedtime.   sodium chloride 0.65 % Soln nasal spray Commonly known as:  OCEAN Place 2 sprays into both nostrils as needed (dry nose).   spironolactone 25 MG tablet Commonly known as:  ALDACTONE Take 25 mg by mouth daily.   tiZANidine 2 MG tablet Commonly known as:  ZANAFLEX Take 1 tablet (2 mg total) by mouth every 6 (six) hours as needed for muscle spasms. What changed:  how much to take   VITAMIN B-12 PO Take 1 tablet by mouth daily.   VITAMIN D PO Take 1 tablet by mouth daily.   ZYRTEC ALLERGY 10 MG tablet Generic drug:  cetirizine Take 10 mg by mouth at bedtime.            Discharge Care Instructions        Start     Ordered   08/28/17 0000  Increase activity slowly     08/28/17 1548   08/28/17 0000  Diet - low sodium heart healthy     08/28/17 1548   08/28/17 0000  Call MD for:  temperature >100.4  08/28/17 1548   08/28/17 0000  Call MD for:  persistant nausea and vomiting     08/28/17 1548   08/28/17 0000  Call MD for:  severe uncontrolled pain     08/28/17 1548   08/28/17 0000  Call MD for:  redness, tenderness, or signs of infection (pain, swelling, redness, odor or green/yellow discharge around incision site)     08/28/17 1548   08/28/17 0000  Call MD for:  difficulty breathing, headache or visual disturbances     08/28/17 1548   08/28/17 0000  Call MD for:  hives     08/28/17 1548   08/28/17 0000  Call MD for:  persistant dizziness or light-headedness     08/28/17 1548   08/28/17 0000  Call MD for:  extreme fatigue     08/28/17 1548      Discharge Assessment: Vitals:   08/28/17 0738 08/28/17 0916  BP: 121/74   Pulse: 66 68  Resp: 18 18  Temp: 97.7 F (36.5 C)   SpO2: 96% 97%    Skin clean, dry and intact without evidence of skin break down, no evidence of skin tears noted. IV catheter discontinued intact. Site without signs and symptoms of complications - no redness or edema noted at insertion site, patient denies c/o pain - only slight tenderness at site.  Dressing with slight pressure applied.  D/c Instructions-Education: Discharge instructions given to patient/family with verbalized understanding. D/c education completed with patient/family including follow up instructions, medication list, d/c activities limitations if indicated, with other d/c instructions as indicated by MD - patient able to verbalize understanding, all questions fully answered. Patient instructed to return to ED, call 911, or call MD for any changes in condition.  Patient escorted via [place], and D/C home via private auto.  Airanna Partin Margaretha Sheffield, RN 08/28/2017 5:54 PM

## 2017-08-29 ENCOUNTER — Telehealth: Payer: Self-pay | Admitting: Internal Medicine

## 2017-08-29 MED ORDER — PITAVASTATIN CALCIUM 4 MG PO TABS: 4.0000 mg | ORAL_TABLET | Freq: Every day | ORAL | 1 refills | Status: DC

## 2017-08-29 NOTE — Telephone Encounter (Signed)
Reviewed chart pt is up-to-date since we can not send electronic script to [facility] so we called rx into [facility] spoke w/Deidra gave refill for Livalo 4 mg. Notified pt refill has been called in...Johny Chess

## 2017-08-29 NOTE — Telephone Encounter (Signed)
Pt called for a refill of her  Pitavastatin Calcium (LIVALO) 4 MG [place]  Please advise and call back

## 2017-08-31 ENCOUNTER — Ambulatory Visit (INDEPENDENT_AMBULATORY_CARE_PROVIDER_SITE_OTHER): Payer: Self-pay | Admitting: Internal Medicine

## 2017-08-31 ENCOUNTER — Encounter: Payer: Self-pay | Admitting: Internal Medicine

## 2017-08-31 ENCOUNTER — Other Ambulatory Visit (INDEPENDENT_AMBULATORY_CARE_PROVIDER_SITE_OTHER): Payer: Self-pay

## 2017-08-31 VITALS — BP 114/78 | HR 80 | Temp 97.9°F | Ht 61.0 in | Wt 173.0 lb

## 2017-08-31 DIAGNOSIS — I10 Essential (primary) hypertension: Secondary | ICD-10-CM

## 2017-08-31 DIAGNOSIS — R55 Syncope and collapse: Secondary | ICD-10-CM

## 2017-08-31 DIAGNOSIS — E559 Vitamin D deficiency, unspecified: Secondary | ICD-10-CM | POA: Insufficient documentation

## 2017-08-31 DIAGNOSIS — R739 Hyperglycemia, unspecified: Secondary | ICD-10-CM

## 2017-08-31 LAB — CBC WITH DIFFERENTIAL/PLATELET
BASOS PCT: 1.4 % (ref 0.0–3.0)
Basophils Absolute: 0.1 10*3/uL (ref 0.0–0.1)
Eosinophils Absolute: 1.3 10*3/uL — ABNORMAL HIGH (ref 0.0–0.7)
HCT: 38.5 % (ref 36.0–46.0)
Hemoglobin: 12.7 g/dL (ref 12.0–15.0)
LYMPHS ABS: 1.9 10*3/uL (ref 0.7–4.0)
Lymphocytes Relative: 30.4 % (ref 12.0–46.0)
MCHC: 33 g/dL (ref 30.0–36.0)
MCV: 94 fl (ref 78.0–100.0)
MONO ABS: 0.4 10*3/uL (ref 0.1–1.0)
Monocytes Relative: 6.1 % (ref 3.0–12.0)
NEUTROS ABS: 2.6 10*3/uL (ref 1.4–7.7)
NEUTROS PCT: 41 % — AB (ref 43.0–77.0)
Platelets: 222 10*3/uL (ref 150.0–400.0)
RBC: 4.1 Mil/uL (ref 3.87–5.11)
RDW: 12.6 % (ref 11.5–15.5)
WBC: 6.3 10*3/uL (ref 4.0–10.5)

## 2017-08-31 LAB — BASIC METABOLIC PANEL
BUN: 14 mg/dL (ref 6–23)
CO2: 30 meq/L (ref 19–32)
Calcium: 9.6 mg/dL (ref 8.4–10.5)
Chloride: 101 mEq/L (ref 96–112)
Creatinine, Ser: 1.15 mg/dL (ref 0.40–1.20)
GFR: 60.97 mL/min (ref >60.00)
Glucose, Bld: 80 mg/dL (ref 70–99)
POTASSIUM: 3.9 meq/L (ref 3.5–5.1)
SODIUM: 136 meq/L (ref 135–145)

## 2017-08-31 LAB — HEPATIC FUNCTION PANEL
ALBUMIN: 4.1 g/dL (ref 3.5–5.2)
ALT: 12 U/L (ref 0–35)
AST: 16 U/L (ref 0–37)
Alkaline Phosphatase: 85 U/L (ref 39–117)
BILIRUBIN TOTAL: 0.8 mg/dL (ref 0.2–1.2)
Bilirubin, Direct: 0.2 mg/dL (ref 0.0–0.3)
Total Protein: 7.3 g/dL (ref 6.0–8.3)

## 2017-08-31 LAB — VITAMIN D 25 HYDROXY (VIT D DEFICIENCY, FRACTURES): VITD: 25.95 ng/mL — ABNORMAL LOW (ref 30.00–100.00)

## 2017-08-31 LAB — TSH: TSH: 1.55 u[IU]/mL (ref 0.35–4.50)
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
## 2017-08-31 NOTE — Assessment & Plan Note (Signed)
stable overall by history and exam, recent data reviewed with pt, and pt to continue medical treatment as before,  to f/u any worsening symptoms or concerns Lab Results  Component Value Date   HGBA1C 5.9 12/02/2014

## 2017-08-31 NOTE — Assessment & Plan Note (Signed)
stable overall by history and exam, recent data reviewed with pt, and pt to continue medical treatment as before,  to f/u any worsening symptoms or concerns BP Readings from Last 3 Encounters:  08/31/17 114/78  08/28/17 121/74  08/11/17 (!) 145/95
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
## 2017-08-31 NOTE — Assessment & Plan Note (Signed)
Also for f/u lab today, consider po replacement

## 2017-09-03 ENCOUNTER — Telehealth: Payer: Self-pay
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
## 2017-09-03 NOTE — Telephone Encounter (Signed)
-----   Message from Biagio Borg, MD sent at 08/31/2017  7:02 PM EDT ----- Left message on MyChart, pt to cont same tx except  The test results show that your current treatment is OK, except the Vit D level is low.  Please start OTC Vit D3 2000 units per day indefinitely.  I will ask the office to let you know this as well.Redmond Baseman to please inform pt

## 2017-09-12 ENCOUNTER — Telehealth: Payer: Self-pay | Admitting: Internal Medicine

## 2017-09-12 MED ORDER — FESOTERODINE FUMARATE ER 4 MG PO TB24: 4.0000 mg | ORAL_TABLET | Freq: Every day | ORAL | 3 refills | Status: DC

## 2017-09-12 MED ORDER — FESOTERODINE FUMARATE ER 4 MG PO TB24: 4.0000 mg | ORAL_TABLET | Freq: Every day | ORAL | 0 refills | Status: DC

## 2017-09-12 NOTE — Telephone Encounter (Signed)
Done erx 

## 2017-09-12 NOTE — Telephone Encounter (Signed)
Pt came by the office. She is needing a refill on fesoterodine (TOVIAZ) 4 MG TB24 tablet. It was sent to [facility] back in July but she no longer uses that pharmacy.. She would like it sent to the [facility] Department but asked if a small supply could be sent to the [facility] so that she can pick it up ASAP. Please advise.

## 2017-09-12 NOTE — Addendum Note (Signed)
Addended by: Biagio Borg on: 09/12/2017 05:20 PM   Modules accepted: Orders

## 2017-09-17 ENCOUNTER — Ambulatory Visit (INDEPENDENT_AMBULATORY_CARE_PROVIDER_SITE_OTHER): Payer: Self-pay | Admitting: Internal Medicine

## 2017-09-17 ENCOUNTER — Encounter: Payer: Self-pay | Admitting: Internal Medicine

## 2017-09-17 VITALS — BP 108/76 | HR 82 | Ht 61.0 in | Wt 177.2 lb

## 2017-09-17 DIAGNOSIS — I639 Cerebral infarction, unspecified: Secondary | ICD-10-CM

## 2017-09-17 DIAGNOSIS — R55 Syncope and collapse: Secondary | ICD-10-CM

## 2017-09-17 LAB — CUP PACEART INCLINIC DEVICE CHECK
Implantable Pulse Generator Implant Date: 20151014
MDC IDC SESS DTM: 20181015161654
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
## 2017-09-17 NOTE — Patient Instructions (Addendum)
Medication Instructions:  Your physician recommends that you continue on your current medications as directed. Please refer to the Current Medication list given to you today.   Labwork: None ordered   Testing/Procedures:  LINQ explant on 10/02/2017.Marland Kitchen Please arrive at [facility] main entrance @ 6:30am on 10/02/2017.  Follow-Up:  Follow-up with Dr. Rayann Heman as needed  Follow-up with device clinic 10-14 days for wound check  Any Other Special Instructions Will Be Listed Below (If Applicable).     If you need a refill on your cardiac medications before your next appointment, please call your pharmacy.

## 2017-09-26 MED FILL — SPIRONOLACTONE 25 MG TABLET: 25 | 30 days supply | Qty: 30 | Fill #5

## 2017-10-02 ENCOUNTER — Encounter (HOSPITAL_COMMUNITY): Payer: Self-pay | Admitting: Internal Medicine

## 2017-10-02 ENCOUNTER — Ambulatory Visit (HOSPITAL_COMMUNITY)
Admission: RE | Admit: 2017-10-02 | Discharge: 2017-10-02 | Disposition: A | Discharge status: Home / Self Care | Payer: Self-pay | Source: Ambulatory Visit | Attending: Internal Medicine | Admitting: Internal Medicine

## 2017-10-02 ENCOUNTER — Encounter (HOSPITAL_COMMUNITY)
Admission: RE | Disposition: A | Discharge status: Home / Self Care | Payer: Self-pay | Source: Ambulatory Visit | Attending: Internal Medicine

## 2017-10-02 DIAGNOSIS — I639 Cerebral infarction, unspecified: Secondary | ICD-10-CM | POA: Insufficient documentation

## 2017-10-02 DIAGNOSIS — Z7951 Long term (current) use of inhaled steroids: Secondary | ICD-10-CM | POA: Insufficient documentation

## 2017-10-02 DIAGNOSIS — K219 Gastro-esophageal reflux disease without esophagitis: Secondary | ICD-10-CM | POA: Insufficient documentation

## 2017-10-02 DIAGNOSIS — I10 Essential (primary) hypertension: Secondary | ICD-10-CM | POA: Insufficient documentation

## 2017-10-02 DIAGNOSIS — F329 Major depressive disorder, single episode, unspecified: Secondary | ICD-10-CM | POA: Insufficient documentation

## 2017-10-02 DIAGNOSIS — Z7982 Long term (current) use of aspirin: Secondary | ICD-10-CM | POA: Insufficient documentation

## 2017-10-02 DIAGNOSIS — Z79899 Other long term (current) drug therapy: Secondary | ICD-10-CM | POA: Insufficient documentation

## 2017-10-02 DIAGNOSIS — I6389 Other cerebral infarction: Secondary | ICD-10-CM

## 2017-10-02 DIAGNOSIS — J45909 Unspecified asthma, uncomplicated: Secondary | ICD-10-CM | POA: Insufficient documentation

## 2017-10-02 HISTORY — PX: LOOP RECORDER REMOVAL: EP1215

## 2017-10-02 SURGERY — LOOP RECORDER REMOVAL

## 2017-10-02 MED ORDER — LIDOCAINE-EPINEPHRINE 1 %-1:100000 IJ SOLN
INTRAMUSCULAR | Status: DC | PRN
  Administered 2017-10-02: 20 mL

## 2017-10-02 MED ORDER — LIDOCAINE-EPINEPHRINE 1 %-1:100000 IJ SOLN
INTRAMUSCULAR | Status: AC
  Filled 2017-10-02: qty 1

## 2017-10-02 SURGICAL SUPPLY — 1 items: PACK LOOP INSERTION (CUSTOM PROCEDURE TRAY) ×3 IMPLANT
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
## 2017-10-02 NOTE — Progress Notes (Signed)
Discharge instructions given with stated understanding. Incision clean, dry and intact. No complaints. Discharged ambulatory per self.

## 2017-10-15 ENCOUNTER — Ambulatory Visit (INDEPENDENT_AMBULATORY_CARE_PROVIDER_SITE_OTHER): Payer: Self-pay | Admitting: *Deleted

## 2017-10-15 DIAGNOSIS — I639 Cerebral infarction, unspecified: Secondary | ICD-10-CM

## 2017-10-15 NOTE — Progress Notes (Signed)
Patient presents to the office for a wound check s/p ILR explant. Steri strips removed. Incision edges approximated. Wound well healed without redness or edema. Patient education completed including wound care. Patient to follow up with JA PRN.

## 2017-10-30 MED FILL — SPIRONOLACTONE 25 MG TABLET: 25 | 30 days supply | Qty: 30 | Fill #6

## 2017-11-20 ENCOUNTER — Other Ambulatory Visit: Payer: Self-pay | Admitting: *Deleted

## 2017-11-20 ENCOUNTER — Other Ambulatory Visit: Payer: Self-pay | Admitting: Internal Medicine

## 2017-11-20 MED ORDER — ALBUTEROL SULFATE HFA 108 (90 BASE) MCG/ACT IN AERS: 2.0000 | INHALATION_SPRAY | Freq: Four times a day (QID) | RESPIRATORY_TRACT | 0 refills | Status: DC | PRN

## 2017-11-21 MED FILL — SPIRONOLACTONE 25 MG TABLET: 25 | 30 days supply | Qty: 30 | Fill #7

## 2017-11-23 ENCOUNTER — Other Ambulatory Visit: Payer: Self-pay | Admitting: Internal Medicine

## 2017-12-03 ENCOUNTER — Other Ambulatory Visit: Payer: Self-pay

## 2017-12-03 MED ORDER — MOMETASONE FURO-FORMOTEROL FUM 100-5 MCG/ACT IN AERO: 2.0000 | INHALATION_SPRAY | Freq: Two times a day (BID) | RESPIRATORY_TRACT | 1 refills | Status: DC

## 2017-12-03 MED ORDER — ALBUTEROL SULFATE HFA 108 (90 BASE) MCG/ACT IN AERS: 2.0000 | INHALATION_SPRAY | Freq: Four times a day (QID) | RESPIRATORY_TRACT | 1 refills | Status: DC | PRN

## 2017-12-05 ENCOUNTER — Telehealth: Payer: Self-pay | Admitting: Acute Care

## 2017-12-05 ENCOUNTER — Telehealth: Payer: Self-pay | Admitting: Internal Medicine

## 2017-12-05 MED ORDER — PREDNISONE 10 MG PO TABS: ORAL_TABLET | ORAL | 0 refills | Status: DC

## 2017-12-05 MED ORDER — AZITHROMYCIN 250 MG PO TABS: ORAL_TABLET | ORAL | 0 refills | Status: DC

## 2017-12-05 MED FILL — AZITHROMYCIN 250 MG TAB: 250 | 5 days supply | Qty: 6 | Fill #0

## 2017-12-05 MED FILL — predniSONE 10 MG TABS: 10 | 5 days supply | Qty: 11 | Fill #0

## 2017-12-05 NOTE — Telephone Encounter (Signed)
Spoke to patient  Pt reports dry cough, cannot get the mucus up Pt has SOB w/extertion, wheezing. Pt denies fever, chills, body aches Pt had some relief with mucinex DM, saline nasal spray, and rescue inhaler  SG please advise

## 2017-12-05 NOTE — Telephone Encounter (Signed)
Offer prednisone 40 mg x1 day, then 30 mg x1 day, then 20 mg x1 day, then 10 mg x1 day, and then 5 mg x1 day and stop Please take Z pak  If no better seek emergency care. Make sure she is compliant with her maintenance medications/ inhalers. Will need follow up appointment in 2-3 weeks. Thanks

## 2017-12-05 NOTE — Telephone Encounter (Signed)
Spoke with [employer]. She stated that the quantity for the prednisone taper was off. 20 tablets were called in but patient only needed 10.5 or 11 tabs. Gave a verbal for 11 tabs.   Nothing else needed at time of call.

## 2017-12-05 NOTE — Telephone Encounter (Signed)
Spoke with patient, advised her of SG recommendations Placed RX to [facility] in [place] for Prednisone taper and Z Pak  Advised pt if no better to seek emergency care. Pt advised she is compliant with her maintenance medications and inhalers. Pt is scheduled for follow up appt with MW Friday 12/07/2017,  Offered pt 2-3 week follow up appt, pt advised that she will call office 12/06/2017 if she will cancel appt for Friday or not due to how pt is feeling. If no better, she would like to keep her Friday appt.  Nothing further needed

## 2017-12-07 ENCOUNTER — Ambulatory Visit: Payer: Self-pay | Admitting: Internal Medicine

## 2017-12-18 ENCOUNTER — Encounter: Payer: Self-pay | Admitting: Internal Medicine

## 2017-12-18 ENCOUNTER — Ambulatory Visit (INDEPENDENT_AMBULATORY_CARE_PROVIDER_SITE_OTHER): Payer: Self-pay | Admitting: Internal Medicine

## 2017-12-18 VITALS — BP 106/66 | HR 80 | Ht 62.0 in | Wt 170.0 lb

## 2017-12-18 DIAGNOSIS — J328 Other chronic sinusitis: Secondary | ICD-10-CM

## 2017-12-18 DIAGNOSIS — J45991 Cough variant asthma: Secondary | ICD-10-CM

## 2017-12-18 MED ORDER — AMOXICILLIN-POT CLAVULANATE 875-125 MG PO TABS: 1.0000 | ORAL_TABLET | Freq: Two times a day (BID) | ORAL | 11 refills | Status: AC

## 2017-12-18 NOTE — Patient Instructions (Signed)
See [name] win 3 months  with all your medications, even over the counter meds, separated in two separate bags, the ones you take no matter what vs the ones you stop once you feel better and take only as needed when you feel you need them.   Andrea Mason  will generate for you a new user friendly medication calendar that will put [place] all on the same page re: your medication use.     Without this process, it simply isn't possible to assure that we are providing  your outpatient care  with  the attention to detail we feel you deserve.   If we cannot assure that you're getting that kind of care,  then we cannot manage your problem effectively from this clinic.  Once you have seen Andrea Mason and we are sure that we're all on the same page with your medication use she will arrange follow up with me in 3 months with full pfts

## 2017-12-18 NOTE — Progress Notes (Signed)
Subjective:   Patient ID: Andrea Mason, female    DOB: 04-10-1951   MRN: [id number]  Brief patient profile:  41  yobf never smoker followed by Dr Andrea Mason for allergic rhinitis and asthma     History of Present Illness  02/05/2013 1st pulmonary eval in [facility] era baseline = completely better on qvar 80 2bid and only use rescue once a week at baseline then much worse starting 02/03/13 with cough/ congestion/ green mucus and rescue x one by time of ov at 2pm.  No resting sob, very hoarse with harsh barking cough >Augmentin rx     01/12/2016  f/u ov/Andrea Mason re: severe nasal congestion / never called ENT Chief Complaint  Patient presents with  . Acute Visit    Pt c/o nasal congestion and has had green nasal d/c.   no cough or sob just can't breath thru nose and now drainage is purulent/ no sinus pain or fever /toothache rec Augmentin 875 mg take one pill twice daily  X 10 days - take at breakfast and supper with large glass of water.    Prednisone 10 mg take  4 each am x 2 days,   2 each am x 2 days,  1 each am x 2 days and stop  Make sure to make appt to see Andrea Mason in about 2 weeks> could not see due to cost     04/05/2016 NP Follow up : Cough /Asthma vs VCD    she is doing well. She denies any flare for cough or wheezing. Feels that her overall breathing has been doing better. She was seen last visit with a asthmatic bronchitic flare and sinusitis.  Improved with Augmentin and prednisone taper. We reviewed all her medications organize them into a medication count with patient education. Appears that she is taking her medications correctly. 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 Follow-up    71mo rov. c/o nasal drainage w/light green mucus, occ wheezing & occ sob w/exertion  nasal drainage discolored x light green esp  hs / better while on augmentin  Breathing better though still doe /  Min need for saba  rec To med calendar action plan we added today: For nasty drainage > Augmentin 875 mg take one pill twice daily  X 10 days      12/18/2017  f/u ov/Andrea Mason re:  Asthma/ chronic rhinitis / doing better following med calendar  Chief Complaint  Patient presents with  . Follow-up    slight cough, red colored mucus when blowing her nose, feels like z pac and prednisone helped after her last visit   very rare saba need  Not limited by breathing from desired activities   Sleeping fine  No obvious day to day or daytime variability or assoc excess/ purulent sputum or mucus plugs or hemoptysis or cp or chest tightness, subjective wheeze or overt sinus or hb symptoms. No unusual exposure hx or h/o childhood pna/ asthma or knowledge of premature birth.  Sleeping ok flat without nocturnal  or early am exacerbation  of respiratory  c/o's or need for noct saba. Also denies any obvious fluctuation of symptoms with weather or environmental changes or other aggravating or alleviating factors except as outlined above   Current Allergies, Complete Past Medical History, Past Surgical History, Family History, and Social  History were reviewed in [place] record.  ROS  The following are not active complaints unless bolded Hoarseness, sore throat, dysphagia, dental problems, itching, sneezing,  nasal congestion or discharge of excess mucus or purulent secretions, ear ache,   fever, chills, sweats, unintended wt loss or wt gain, classically pleuritic or exertional cp,  orthopnea pnd or leg swelling, presyncope, palpitations, abdominal pain, anorexia, nausea, vomiting, diarrhea  or change in bowel habits or change in bladder habits, change in stools or change in urine, dysuria, hematuria,  rash, arthralgias, visual complaints, headache, numbness, weakness or ataxia or problems with walking or coordination,   change in mood/affect or memory.        Current Meds  Medication Sig  . acetaminophen (TYLENOL) 325 MG tablet Take 325 mg by mouth every 6 (six) hours as needed for mild pain.   Marland Kitchen albuterol (PROVENTIL HFA;VENTOLIN HFA) 108 (90 Base) MCG/ACT inhaler Inhale 2 puffs into the lungs every 6 (six) hours as needed for wheezing or shortness of breath.  Marland Kitchen aspirin 325 MG tablet Take 325 mg by mouth daily.  . baclofen (LIORESAL) 10 MG tablet Take 0.5 tablets (5 mg total) by mouth 3 (three) times daily. (Patient taking differently: Take 5 mg by mouth 3 (three) times daily as needed (for pain/muscle spasms.). )  . Biotin 10000 MCG TABS Take 10,000 mcg by mouth daily.  . cetirizine (ZYRTEC ALLERGY) 10 MG tablet Take 10 mg by mouth at bedtime.   . Cholecalciferol (VITAMIN D3) 2000 units TABS Take 2,000 Units by mouth daily.  . Cyanocobalamin (VITAMIN B-12) 5000 MCG TBDP Take 5,000 mcg by mouth daily.  Marland Kitchen dexlansoprazole (DEXILANT) 60 MG capsule Take 1 capsule (60 mg total) by mouth daily.  Marland Kitchen dextromethorphan-guaiFENesin (MUCINEX DM) 30-600 MG 12hr tablet Take 1-2 tablets by mouth 2 (two) times daily as needed (for congestion/cough).  . fesoterodine (TOVIAZ) 4 MG TB24 tablet Take 1 tablet (4 mg total) by mouth daily. (Patient taking differently: Take 4 mg by mouth at bedtime. )  . Magnesium 250 MG TABS Take 250 mg by mouth daily with lunch.  . mometasone (NASONEX) 50 MCG/ACT nasal spray Place 2 sprays into the nose 2 (two) times daily.  . mometasone-formoterol (DULERA) 100-5 MCG/ACT AERO Inhale 2 puffs into the lungs 2 (two) times daily.  . montelukast (SINGULAIR) 10 MG tablet Take 1 tablet (10 mg total) by mouth at bedtime.  Marland Kitchen oxymetazoline (AFRIN) 0.05 % nasal spray Place 2 sprays into both nostrils 2 (two) times daily as needed (nasal stuffiness and nasal pressure for 5 days).   . Pitavastatin Calcium (LIVALO) 4 MG TABS Take 1 tablet (4 mg total) by mouth daily.  . Probiotic Product (PROBIOTIC PO) Take 1  capsule by mouth daily with lunch. Women's Probiotic  . ranitidine (ZANTAC) 150 MG tablet Take 150 mg by mouth at bedtime.  . sodium chloride (OCEAN) 0.65 % SOLN nasal spray Place 2 sprays into both nostrils as needed (dry nose).   . Soft Lens Products (REWETTING DROPS) SOLN Place 1-2 drops into both eyes daily as needed (for dry eyes.).  Marland Kitchen spironolactone (ALDACTONE) 25 MG tablet Take 25 mg by mouth daily.   . [DISCONTINUED] predniSONE (DELTASONE) 10 MG tablet Take 40mg  x 1 day, then 30mg  x1day, 20mg  x1 day, 10mg  x1day, then 5mg  x 1day and then stop.  Objective:     amb bf nad    12/18/2017          170   07/11/16 159 lb 3.2 oz (72.2 kg)  07/11/16 160 lb (72.6 kg)  04/05/16 163 lb (73.9 kg)    Vital signs reviewed - Note on arrival 02 sats  100% on RA         HEENT: nl dentition, turbinates bilaterally, and oropharynx. Nl external ear canals without cough reflex   NECK :  without JVD/Nodes/TM/ nl carotid upstrokes bilaterally   LUNGS: no acc muscle use,  Nl contour chest which is clear to A and P bilaterally without cough on insp or exp maneuvers   CV:  RRR  no s3 or murmur or increase in P2, and no edema   ABD:  soft and nontender with nl inspiratory excursion in the supine position. No bruits or organomegaly appreciated, bowel sounds nl  MS:  Nl gait/ ext warm without deformities, calf tenderness, cyanosis or clubbing No obvious joint restrictions   SKIN: warm and dry without lesions    NEURO:  alert, approp, nl sensorium with  Unusual speech pattern/ mild dysarthria               Assessment & Plan:

## 2017-12-19 ENCOUNTER — Encounter: Payer: Self-pay | Admitting: Internal Medicine

## 2017-12-19 NOTE — Assessment & Plan Note (Signed)
CT 2012-chronic sinus disease - Referred back to Dr [name] 01/07/16 > could not see due to $ as has no insurance - 07/11/2016 placed on augmentin x 10 d cylces as part of her action plan   Reviewed action plan again with her and advised to keep up with refills

## 2017-12-19 NOTE — Assessment & Plan Note (Signed)
-  med calendar 03/17/2013 > not using 10/24/2013 , 04/07/2014  > did not bring to clinic 10/20/14  Or 12/11/2014 > brought it 07/11/2016   - Spirometry 12/11/2014  Min abn mid flows only  12/18/2017  After extensive coaching inhaler device  effectiveness =    75% (short Ti)  Despite suboptimal hfa>>> All goals of chronic asthma control met including optimal function and elimination of symptoms with minimal need for rescue therapy.  Contingencies discussed in full including contacting this office immediately if not controlling the symptoms using the rule of two's.      Each maintenance medication was reviewed in detail including most importantly the difference between maintenance and as needed and under what circumstances the prns are to be used. This was done in the context of a medication calendar review which provided the patient with a user-friendly unambiguous mechanism for medication administration and reconciliation and provides an action plan for all active problems. It is critical that this be shown to every doctor  for modification during the office visit if necessary so the patient can use it as a working document.

## 2017-12-28 MED FILL — SPIRONOLACTONE 25 MG TABLET: 25 | 30 days supply | Qty: 30 | Fill #8

## 2018-01-18 ENCOUNTER — Telehealth: Payer: Self-pay | Admitting: Internal Medicine

## 2018-01-18 MED ORDER — MOMETASONE FURO-FORMOTEROL FUM 100-5 MCG/ACT IN AERO: 2.0000 | INHALATION_SPRAY | Freq: Two times a day (BID) | RESPIRATORY_TRACT | 1 refills | Status: DC

## 2018-01-18 NOTE — Telephone Encounter (Addendum)
Called and spoke with pt, who requested refills on Dulera.  Rx for Ruthe Mannan has been faxed to [place].  Nothing further is needed.

## 2018-01-18 NOTE — Addendum Note (Signed)
Addended by: Maryanna Shape A on: 01/18/2018 01:41 PM   Modules accepted: Orders

## 2018-01-21 ENCOUNTER — Other Ambulatory Visit: Payer: Self-pay | Admitting: *Deleted

## 2018-01-21 MED ORDER — ALBUTEROL SULFATE HFA 108 (90 BASE) MCG/ACT IN AERS: 2.0000 | INHALATION_SPRAY | Freq: Four times a day (QID) | RESPIRATORY_TRACT | 3 refills | Status: DC | PRN

## 2018-01-21 MED ORDER — MOMETASONE FURO-FORMOTEROL FUM 100-5 MCG/ACT IN AERO: 2.0000 | INHALATION_SPRAY | Freq: Two times a day (BID) | RESPIRATORY_TRACT | 3 refills | Status: DC

## 2018-01-21 MED FILL — SPIRONOLACTONE 25 MG TABLET: 25 | 30 days supply | Qty: 30 | Fill #9

## 2018-02-15 MED FILL — AMOX TR-K CLV 875-125 MG TA: 875-125 | 10 days supply | Qty: 20 | Fill #0 | Status: TO

## 2018-02-15 MED FILL — SPIRONOLACTONE 25 MG TABLET: 25 | 30 days supply | Qty: 30 | Fill #10

## 2018-03-22 MED FILL — SPIRONOLACTONE 25 MG TABLET: 25 | 30 days supply | Qty: 30 | Fill #11

## 2018-03-25 MED FILL — MONTELUKAST SOD 10 MG TAB: 10 | 30 days supply | Qty: 30 | Fill #0 | Status: TO

## 2018-04-07 ENCOUNTER — Emergency Department (HOSPITAL_COMMUNITY)
Admission: EM | Admit: 2018-04-07 | Discharge: 2018-04-08 | Disposition: A | Discharge status: Home / Self Care | Payer: PPO | Attending: Emergency Medicine | Admitting: Emergency Medicine

## 2018-04-07 ENCOUNTER — Other Ambulatory Visit: Payer: Self-pay

## 2018-04-07 ENCOUNTER — Encounter (HOSPITAL_COMMUNITY): Payer: Self-pay

## 2018-04-07 DIAGNOSIS — I10 Essential (primary) hypertension: Secondary | ICD-10-CM | POA: Diagnosis not present

## 2018-04-07 DIAGNOSIS — R112 Nausea with vomiting, unspecified: Secondary | ICD-10-CM | POA: Diagnosis not present

## 2018-04-07 DIAGNOSIS — R1110 Vomiting, unspecified: Secondary | ICD-10-CM | POA: Diagnosis present

## 2018-04-07 DIAGNOSIS — J45909 Unspecified asthma, uncomplicated: Secondary | ICD-10-CM | POA: Insufficient documentation

## 2018-04-07 DIAGNOSIS — Z7982 Long term (current) use of aspirin: Secondary | ICD-10-CM | POA: Insufficient documentation

## 2018-04-07 DIAGNOSIS — R6883 Chills (without fever): Secondary | ICD-10-CM | POA: Diagnosis not present

## 2018-04-07 LAB — CBC
HCT: 37.9 % (ref 36.0–46.0)
Hemoglobin: 12.6 g/dL (ref 12.0–15.0)
MCH: 30.7 pg (ref 26.0–34.0)
MCHC: 33.2 g/dL (ref 30.0–36.0)
MCV: 92.2 fL (ref 78.0–100.0)
PLATELETS: 197 10*3/uL (ref 150–400)
RBC: 4.11 MIL/uL (ref 3.87–5.11)
RDW: 12.4 % (ref 11.5–15.5)
WBC: 6.3 10*3/uL (ref 4.0–10.5)

## 2018-04-07 LAB — URINALYSIS, ROUTINE W REFLEX MICROSCOPIC
Bacteria, UA: NONE SEEN
Bilirubin Urine: NEGATIVE
GLUCOSE, UA: NEGATIVE mg/dL
Ketones, ur: NEGATIVE mg/dL
Nitrite: NEGATIVE
PH: 6 (ref 5.0–8.0)
PROTEIN: NEGATIVE mg/dL
Specific Gravity, Urine: 1.009 (ref 1.005–1.030)

## 2018-04-07 LAB — COMPREHENSIVE METABOLIC PANEL
ALBUMIN: 4 g/dL (ref 3.5–5.0)
ALK PHOS: 98 U/L (ref 38–126)
ALT: 14 U/L (ref 14–54)
ANION GAP: 8 (ref 5–15)
AST: 20 U/L (ref 15–41)
BILIRUBIN TOTAL: 1.3 mg/dL — AB (ref 0.3–1.2)
BUN: 17 mg/dL (ref 6–20)
CO2: 25 mmol/L (ref 22–32)
Calcium: 9.1 mg/dL (ref 8.9–10.3)
Chloride: 103 mmol/L (ref 101–111)
Creatinine, Ser: 0.95 mg/dL (ref 0.44–1.00)
GFR calc Af Amer: >60 mL/min (ref >60)
GFR calc non Af Amer: >60 mL/min (ref >60)
GLUCOSE: 98 mg/dL (ref 65–99)
Potassium: 3.9 mmol/L (ref 3.5–5.1)
Sodium: 136 mmol/L (ref 135–145)
Total Protein: 7.7 g/dL (ref 6.5–8.1)

## 2018-04-07 LAB — LIPASE, BLOOD: Lipase: 44 U/L (ref 11–51)

## 2018-04-07 MED ORDER — ONDANSETRON HCL 4 MG/2ML IJ SOLN
4.0000 mg | Freq: Once | INTRAMUSCULAR | Status: AC
  Administered 2018-04-07: 4 mg via INTRAVENOUS
  Filled 2018-04-07: qty 2

## 2018-04-07 MED ORDER — SODIUM CHLORIDE 0.9 % IV BOLUS
1000.0000 mL | Freq: Once | INTRAVENOUS | Status: AC
  Administered 2018-04-07: 1000 mL via INTRAVENOUS

## 2018-04-07 MED ORDER — ONDANSETRON HCL 4 MG PO TABS: 4.0000 mg | ORAL_TABLET | Freq: Three times a day (TID) | ORAL | 0 refills | Status: DC | PRN

## 2018-04-07 NOTE — ED Provider Notes (Signed)
[facility] DEPT Provider Note   CSN: [id number] Arrival date & time: 04/07/18  2047     History   Chief Complaint Chief Complaint  Patient presents with  . Emesis    HPI Andrea Mason is a 67 y.o. female.  The history is provided by the patient and medical records. No language interpreter was used.  Emesis   This is a new problem. The current episode started 6 to 12 hours ago. The problem occurs 2 to 4 times per day. The problem has not changed since onset.The emesis has an appearance of stomach contents. There has been no fever. Associated symptoms include chills. Pertinent negatives include no abdominal pain, no cough, no diarrhea, no fever, no headaches, no sweats and no URI. Risk factors include suspect food intake.    Past Medical History:  Diagnosis Date  . Allergic rhinitis   . Asthma   . Complication of anesthesia   . Depression   . DJD (degenerative joint disease), cervical   . GERD (gastroesophageal reflux disease)   . History of colonic polyps   . Hypertension   . Obesity   . 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 Stroke ([place])   . Syncope     Patient Active Problem List   Diagnosis Date Noted  . Vitamin D deficiency 08/31/2017  . Facial contusion, subsequent encounter 03/12/2017  . Vertigo 03/12/2017  . Right rotator cuff tear 07/27/2016  . Right shoulder pain 07/11/2016  . Overactive bladder 01/19/2016  . Cough variant asthma 09/07/2015  . Burn 09/06/2015  . Vocal cord dysfunction 04/05/2015  . Asthma with exacerbation 04/05/2015  . Left knee pain 03/12/2015  . Right knee pain 03/12/2015  . Cough 12/08/2014  . Skin lumps, generalized 09/08/2014  . Toe pain, right 08/06/2014  . Pre-ulcerative corn or callous 08/06/2014  . Hammertoe 08/06/2014  . Swelling of joint, ankle, right 08/06/2014  . Pansinusitis 06/02/2014  . Cerebral thrombosis with cerebral infarction ([place]) 05/26/2014  . Weakness 05/25/2014  .  Right arm pain 05/25/2014  . Numbness in right leg 05/25/2014  . Numbness and tingling of right arm 05/25/2014  . Embolic stroke ([place]) 79/89/2119  . 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 Alterations of sensations, late effect of cerebrovascular disease(438.6) 02/10/2014  . Cerebral infarction ([place]) 11/25/2013  . Hyperlipidemia 11/24/2013  . Syncope 11/20/2013  . 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 Back pain 11/06/2013  . Hypokalemia 05/06/2013  . Family history of colon cancer 05/06/2013  . Lipoma 09/03/2012  . Hyperglycemia 08/15/2012  . Constipation 01/02/2012  . Preventative health care 05/24/2011  . 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 HYPERSOMNIA WITH SLEEP APNEA UNSPECIFIED 01/26/2011  . UTI (urinary tract infection) 11/15/2010  . NASAL POLYP 07/28/2010  . FIBROIDS, UTERUS 07/27/2010  . COMPUTERIZED TOMOGRAPHY, CHEST, ABNORMAL 09/16/2009  . Seasonal and perennial allergic rhinitis 05/29/2009  . PERIPHERAL EDEMA 05/28/2009  . Mild persistent chronic asthma without complication 41/74/0814  . LEG PAIN, LEFT 06/30/2008  . COLONIC POLYPS, HX OF 06/30/2008  . OBESITY 12/12/2007  . Depression 12/12/2007  . Essential hypertension 12/12/2007  . RHINOSINUSITIS, CHRONIC 12/12/2007  . GERD 12/12/2007  . ARTHRITIS 12/12/2007    Past Surgical History:  Procedure Laterality Date  . FOOT SURGERY  1998  . 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 LOOP RECORDER REMOVAL N/A 10/02/2017   Procedure: LOOP RECORDER REMOVAL;  Surgeon: [name], MD;  Location: [facility];  Service: Cardiovascular;  Laterality: N/A;  . NASAL POLYP SURGERY  07/2006   x 2 with Dr. [name]  . NASAL SINUS SURGERY  07/2006   Dr. [name]  . TUBAL LIGATION       OB History   None      Home Medications    Prior to Admission medications   Medication Sig Start Date  End Date Taking? Authorizing Provider  acetaminophen (TYLENOL) 325 MG tablet Take 325 mg by mouth every 6 (six) hours as needed for mild pain.     [provider]  albuterol (PROVENTIL HFA;VENTOLIN HFA) 108 (90 Base) MCG/ACT inhaler Inhale 2 puffs into the lungs every 6 (six) hours as needed for wheezing or shortness of breath. 01/21/18   Tanda Rockers, MD  aspirin 325 MG tablet Take 325 mg by mouth daily.    [provider]  baclofen (LIORESAL) 10 MG tablet Take 0.5 tablets (5 mg total) by mouth 3 (three) times daily. Patient taking differently: Take 5 mg by mouth 3 (three) times daily as needed (for pain/muscle spasms.).  08/05/15   Garvin Fila, MD  Biotin 10000 MCG TABS Take 10,000 mcg by mouth daily.    [provider]  cetirizine (ZYRTEC ALLERGY) 10 MG tablet Take 10 mg by mouth at bedtime.     [provider]  Cholecalciferol (VITAMIN D3) 2000 units TABS Take 2,000 Units by mouth daily.    [provider]  Cyanocobalamin (VITAMIN B-12) 5000 MCG TBDP Take 5,000 mcg by mouth daily.    [provider]  dexlansoprazole (DEXILANT) 60 MG capsule Take 1 capsule (60 mg total) by mouth daily. 05/22/17   Biagio Borg, MD  dextromethorphan-guaiFENesin [facility] DM) 30-600 MG 12hr tablet Take 1-2 tablets by mouth 2 (two) times daily as needed (for congestion/cough).    [provider]  fesoterodine (TOVIAZ) 4 MG TB24 tablet Take 1 tablet (4 mg total) by mouth daily. Patient taking differently: Take 4 mg by mouth at bedtime.  09/12/17   Biagio Borg, MD  Magnesium 250 MG TABS Take 250 mg by mouth daily with lunch.    [provider]  mometasone (NASONEX) 50 MCG/ACT nasal spray Place 2 sprays into the nose 2 (two) times daily. 04/23/17   Tanda Rockers, MD  mometasone-formoterol (DULERA) 100-5 MCG/ACT AERO Inhale 2 puffs into the lungs 2 (two) times daily. 01/21/18   Tanda Rockers, MD  montelukast (SINGULAIR) 10 MG tablet Take 1  tablet (10 mg total) by mouth at bedtime. 07/25/17   Tanda Rockers, MD  oxymetazoline (AFRIN) 0.05 % nasal spray Place 2 sprays into both nostrils 2 (two) times daily as needed (nasal stuffiness and nasal pressure for 5 days).     [provider]  Pitavastatin Calcium (LIVALO) 4 MG TABS Take 1 tablet (4 mg total) by mouth daily. 08/29/17   Biagio Borg, MD  Probiotic Product (PROBIOTIC PO) Take 1 capsule by mouth daily with lunch. Women's Probiotic    [provider]  ranitidine (ZANTAC) 150 MG tablet Take 150 mg by mouth at bedtime.    [provider]  sodium chloride (OCEAN) 0.65 % SOLN nasal spray Place 2 sprays into both nostrils as needed (dry nose).     [provider]  Soft Lens Products (REWETTING DROPS) SOLN Place 1-2 drops into both eyes daily as needed (for dry eyes.).    [provider]  spironolactone (ALDACTONE) 25 MG tablet Take 25 mg by mouth daily.     [provider]    Family History Family History  Problem Relation Age of Onset  . Colon cancer Brother   . Stroke Brother   . Arrhythmia Brother   . Cancer Mother   . Diabetes Sister   . Lung disease Son        Sarcoid vs ARDS related fibrosis, required 2 double lung transplants  . Congenital heart disease Brother   . Heart attack Brother     Social History Social History   Tobacco Use  . Smoking status: Never Smoker  . Smokeless tobacco: Never Used  Substance Use Topics  . Alcohol use: Yes    Alcohol/week: 0.6 oz    Types: 1 Glasses of wine per week    Comment: socially  . Drug use: No     Allergies   Tramadol; Ivp dye [iodinated diagnostic agents]; and Promethazine-codeine   Review of Systems Review of Systems  Constitutional: Positive for chills. Negative for diaphoresis, fatigue and fever.  HENT: Negative for congestion.   Eyes: Negative for photophobia and visual disturbance.  Respiratory: Negative for cough, chest tightness, shortness of  breath, wheezing and stridor.   Gastrointestinal: Positive for nausea and vomiting. Negative for abdominal distention, abdominal pain, constipation and diarrhea.  Genitourinary: Negative for dysuria and flank pain.  Musculoskeletal: Negative for back pain, neck pain and neck stiffness.  Skin: Negative for rash and wound.  Neurological: Negative for light-headedness, numbness and headaches.  Psychiatric/Behavioral: Negative for agitation.  All other systems reviewed and are negative.    Physical Exam Updated Vital Signs BP (!) 143/90 (BP Location: Left Arm)   Pulse 88   Temp 98 F (36.7 C) (Oral)   Resp 18   Ht 5\' 1"  (1.549 m)   Wt 79.4 kg (175 lb)   SpO2 98%   BMI 33.07 kg/m   Physical Exam  Constitutional: She is oriented to person, place, and time. She appears well-developed and well-nourished. No distress.  HENT:  Head: Normocephalic.  Nose: Nose normal.  Mouth/Throat: Oropharynx is clear and moist. No oropharyngeal exudate.  Eyes: Pupils are equal, round, and reactive to light. Conjunctivae and EOM are normal.  Neck: Normal range of motion. Neck supple.  Cardiovascular: Normal rate and intact distal pulses.  No murmur heard. Pulmonary/Chest: Effort normal. No stridor. No respiratory distress. She has no wheezes. She exhibits no tenderness.  Abdominal: Soft. Normal appearance and bowel sounds are normal. She exhibits no distension. There is no tenderness. There is no rigidity, no rebound, no guarding and no CVA tenderness.  Musculoskeletal: She exhibits no edema or tenderness.  Neurological: She is alert and oriented to person, place, and time. No sensory deficit. She exhibits normal muscle tone.  Skin: No rash noted. She is not diaphoretic. No erythema.  Psychiatric: She has a normal mood and affect.  Nursing note and vitals reviewed.    ED Treatments / Results  Labs (all labs ordered are listed, but only abnormal results are displayed) Labs Reviewed  COMPREHENSIVE  METABOLIC PANEL - Abnormal; Notable for the following components:      Result Value   Total Bilirubin 1.3 (*)    All other components within normal limits  URINALYSIS, ROUTINE W REFLEX MICROSCOPIC - Abnormal; Notable for the following components:   Color, Urine STRAW (*)    Hgb urine dipstick MODERATE (*)    Leukocytes, UA TRACE (*)  All other components within normal limits  URINE CULTURE  LIPASE, BLOOD  CBC    EKG None  Radiology No results found.  Procedures Procedures (including critical care time)  Medications Ordered in ED Medications  ondansetron (ZOFRAN) injection 4 mg (4 mg Intravenous Given 04/07/18 2140)  sodium chloride 0.9 % bolus 1,000 mL (1,000 mLs Intravenous New Bag/Given 04/07/18 2140)     Initial Impression / Assessment and Plan / ED Course  I have reviewed the triage vital signs and the nursing notes.  Pertinent labs & imaging results that were available during my care of the patient were reviewed by me and considered in my medical decision making (see chart for details).     Andrea Mason is a 67 y.o. female with a past medical history significant for hypertension, GERD, asthma, and prior stroke who presents with nausea and vomiting.  Patient reports that adversely 1 PM today she had [employer] for lunch.  She reports that within the hour of ingestion she started having nausea and vomiting.  She reports that her nausea as well as one episode of nonbloody nonbilious emesis.  She reports she has had food poisoning the past and is concerned this may happen.  She is unsure if anybody else got sick and unsure if she had any sick contacts at work but she does report that she ate the coleslaw from [facility] [employer] today.  She denies any abdominal pain, conservation, diarrhea, or dysuria.  She reports some chills but denies lightheadedness or fatigue.  She denies any URI symptoms or other complaints.  Patient says she has had such nausea she has not had anything to eat or drink since  then.  She has not taken medicine to help her symptoms.  On exam, abdomen is nontender.  Lungs are clear.  Chest is nontender.  Patient is alert and oriented.  Patient otherwise appears well.  Suspect patient may have had food poisoning from the mayonnaise-containing coleslaw based on her description of onset of symptoms and lack of other diarrheal symptoms.  Patient is not vomiting now so will be given nausea medication.  She also be given some fluids and have laboratory testing performed to look for electrolyte imbalance.    IF Patient is feeling well, anticipate discharge with nausea medication prescription.  11:20 PM Patient's laboratory testing was overall reassuring.  Urinalysis showed no nitrites and no bacteria.  Doubt UTI.  Metabolic panel and CBC reassuring.  Lipase not elevated.  Suspect food poisoning from the mayonnaise.  Patient was feeling better and able to tolerate p.o. after Zofran.  Patient given prescription for Zofran in order to maintain oral hydration.  Patient will follow-up with her PCP.  Patient is to return precautions for any new or worsened symptoms.  Patient discharged in good condition.    Final Clinical Impressions(s) / ED Diagnoses   Final diagnoses:  Non-intractable vomiting with nausea, unspecified vomiting type    ED Discharge Orders        Ordered    ondansetron (ZOFRAN) 4 MG tablet  Every 8 hours PRN     04/07/18 2319      Clinical Impression: 1. Non-intractable vomiting with nausea, unspecified vomiting type     Disposition: Discharge  Condition: Good  I have discussed the results, Dx and Tx plan with the pt(& family if present). He/she/they expressed understanding and agree(s) with the plan. Discharge instructions discussed at great length. Strict return precautions discussed and pt &/or family have verbalized understanding of the instructions.  No further questions at time of discharge.    New Prescriptions   ONDANSETRON (ZOFRAN) 4 MG  TABLET    Take 1 tablet (4 mg total) by mouth every 8 (eight) hours as needed for nausea or vomiting.    Follow Up: Biagio Borg, MD [place] [id number] [phone]     [facility] [place] [id number] [place]       Shandria Clinch, Gwenyth Allegra, MD 04/07/18 2322

## 2018-04-07 NOTE — Discharge Instructions (Signed)
We suspect your nausea vomiting was due to accidental food poisoning today.  As you are able to tolerate eating and drinking and your labs are reassuring and you are able to maintain hydration, we feel you are safe for discharge home.  Please follow-up with your primary care physician in the next several days and if you develop any new or worsened symptoms, please return to the nearest emergency department.  Please use the nausea medicine to help maintain hydration.

## 2018-04-07 NOTE — ED Triage Notes (Signed)
Pt reports eating at [facility] today and shortly after began experiencing N/V/D and cold chills. Denies chest pain and shortness of breath.

## 2018-04-09 LAB — URINE CULTURE

## 2018-04-17 ENCOUNTER — Telehealth: Payer: Self-pay | Admitting: Pharmacist

## 2018-04-17 NOTE — Patient Outreach (Signed)
[facility]) Care Management  04/17/2018  [name] 07/28/1951 [id number]   Patient was called regarding medication assistance.She answered the phone but said she did not have time to speak with me and would call me back tomorrow.  Plan: Await a call from the patient. Call patient back if I don't hear back from her within 3-5 business days.   Elayne Guerin, PharmD, [place] Clinical Pharmacist [phone]

## 2018-04-23 ENCOUNTER — Other Ambulatory Visit: Payer: Self-pay | Admitting: Pharmacist

## 2018-04-23 ENCOUNTER — Encounter: Payer: Self-pay | Admitting: Pharmacist

## 2018-04-23 ENCOUNTER — Telehealth: Payer: Self-pay | Admitting: Internal Medicine

## 2018-04-23 MED ORDER — PANTOPRAZOLE SODIUM 40 MG PO TBEC: 40.0000 mg | DELAYED_RELEASE_TABLET | Freq: Every day | ORAL | 3 refills | Status: DC

## 2018-04-23 MED FILL — PANTOPRAZOLE SOD DR 40 MG T: 40 | 90 days supply | Qty: 90 | Fill #0 | Status: TO

## 2018-04-23 NOTE — Telephone Encounter (Signed)
Pt has been informed and expressed understanding.  

## 2018-04-23 NOTE — Telephone Encounter (Signed)
lmtcb x 1 for Katina with [facility],

## 2018-04-23 NOTE — Telephone Encounter (Signed)
Ok to let pt know - insurance no longer will pay for the dexilant she has had for some time now  [facility] to change to protonix 40 qd - done erx

## 2018-04-23 NOTE — Patient Outreach (Addendum)
[facility]) Care Management  04/23/2018  Andrea Mason 1951-10-31 [id number]   Subjective: Andrea Mason today for medication assistance. Patient recently turned 78 and now has Medicare Part D coverage with [employer]. She also has dual coverage with Medicaid. Patient requests medication assistance for [facility] and Dexilant. When she was uninsured, she would fill her prescriptions at the [facility], but she has now switched to filling at [facility] since she has enrolled with [employer].   Patient has multiple medical conditions including but not limited to:  History of CVA, hypertension, asthma, depression, GERD, Nasal polyp, and seasonal allergies.  Patient manages her medications on her own.    Objective: Medications Reviewed Today    Reviewed by Elayne Guerin, [facility] (Pharmacist) on 04/23/18 at 1314  Med List Status: <None>  Medication Order Taking? Sig Documenting Provider Last Dose Status Informant  acetaminophen (TYLENOL) 325 MG tablet [id number] Yes Take 325 mg by mouth every 6 (six) hours as needed for mild pain.  [provider] Taking Active Self  albuterol (PROVENTIL HFA;VENTOLIN HFA) 108 (90 Base) MCG/ACT inhaler [id number] Yes Inhale 2 puffs into the lungs every 6 (six) hours as needed for wheezing or shortness of breath. Tanda Rockers, MD Taking Active   amoxicillin-clavulanate (AUGMENTIN) 875-125 MG tablet [id number] Yes Take 1 tablet by mouth 2 (two) times daily. Tanda Rockers, MD Taking Active   aspirin 325 MG tablet [id number] Yes Take 325 mg by mouth daily. [provider] Taking Active Self  baclofen (LIORESAL) 10 MG tablet [id number] Yes Take 0.5 tablets (5 mg total) by mouth 3 (three) times daily.  Patient taking differently:  Take 5 mg by mouth 3 (three) times daily as needed (for pain/muscle spasms.).    Garvin Fila, MD Taking Active Self  Biotin 10000 MCG TABS [id number] Yes Take 10,000 mcg by mouth  daily. [provider] Taking Active Self  cetirizine (ZYRTEC ALLERGY) 10 MG tablet [id number] Yes Take 10 mg by mouth at bedtime.  [provider] Taking Active Self  Cholecalciferol (VITAMIN D3) 2000 units TABS [id number] Yes Take 2,000 Units by mouth daily. [provider] Taking Active Self  Cyanocobalamin (VITAMIN B-12) 5000 MCG TBDP [id number] Yes Take 5,000 mcg by mouth daily. [provider] Taking Active Self  dexlansoprazole (DEXILANT) 60 MG capsule [id number] Yes Take 1 capsule (60 mg total) by mouth daily. Biagio Borg, MD Taking Active Self           Med Note Elayne Guerin   Tue Apr 23, 2018  1:06 PM) Cost  dextromethorphan-guaiFENesin [facility] DM) 30-600 MG 12hr tablet [id number] Yes Take 1-2 tablets by mouth 2 (two) times daily as needed (for congestion/cough). [provider] Taking Active Self  fesoterodine (TOVIAZ) 4 MG TB24 tablet [id number] Yes Take 1 tablet (4 mg total) by mouth daily.  Patient taking differently:  Take 4 mg by mouth at bedtime.    Biagio Borg, MD Taking Active Self  Magnesium 250 MG TABS [id number] Yes Take 250 mg by mouth daily with lunch. [provider] Taking Active Self  mometasone (NASONEX) 50 MCG/ACT nasal spray [id number] Yes Place 2 sprays into the nose 2 (two) times daily. Tanda Rockers, MD Taking Active Self  mometasone-formoterol [facility]) 100-5 MCG/ACT Hollie Salk [id number] Yes Inhale 2 puffs into the lungs 2 (two) times daily. Tanda Rockers, MD Taking Active   montelukast (SINGULAIR) 10 MG tablet [id number] Yes Take 1 tablet (10 mg total) by  mouth at bedtime. Tanda Rockers, MD Taking Active Self  ondansetron [facility]) 4 MG tablet [id number] No Take 1 tablet (4 mg total) by mouth every 8 (eight) hours as needed for nausea or vomiting.  Patient not taking:  Reported on 04/23/2018   Tegeler, Gwenyth Allegra, MD Not Taking Active   oxymetazoline (AFRIN) 0.05 % nasal spray [id number] Yes Place 2 sprays into  both nostrils 2 (two) times daily as needed (nasal stuffiness and nasal pressure for 5 days).  [provider] Taking Active Self  Pitavastatin Calcium (LIVALO) 4 MG TABS [id number] Yes Take 1 tablet (4 mg total) by mouth daily. Biagio Borg, MD Taking Active Self  Probiotic Product (PROBIOTIC PO) [id number] Yes Take 1 capsule by mouth daily with lunch. Women's Probiotic [provider] Taking Active Self  ranitidine (ZANTAC) 150 MG tablet [id number] Yes Take 150 mg by mouth at bedtime. [provider] Taking Active Self  sodium chloride (OCEAN) 0.65 % SOLN nasal spray [id number] Yes Place 2 sprays into both nostrils as needed (dry nose).  [provider] Taking Active Self  Soft Lens Products (REWETTING DROPS) SOLN [id number] Yes Place 1-2 drops into both eyes daily as needed (for dry eyes.). [provider] Taking Active   spironolactone (ALDACTONE) 25 MG tablet [id number] Yes Take 25 mg by mouth daily.  [provider] Taking Active Self           Med Note Dorina Hoyer, [facility]   Tue Aug 28, 2017  1:42 PM)           Medications were reviewed today.   Assessment:  Drugs sorted by system:  Cardiovascular: Aspirin 325 mg, pitavastatin 4 mg   Pulmonary/Allergy: albuterol HFA,, cetirizine 10 mg, nasonex nasal spray, dulera 100-5 mcg HFA, montelukast 10 mg, Afrin 0.05% nasal spray, sodium chloride 0.65% nasal spray  Gastrointestinal: Dexilant 60 mg, ranitidine 150 mg   Endocrine: spironolactone 25 mg   Renal: Toviaz 4 mg  Pain: acetaminophen 325 mg, baclofen 10 mg    Vitamins/Minerals: Biotin 1000 mcg, cholecalciferol 2000 u, cyanocobalamin 2500 mg, magnesium 250 mg  Infectious Diseases: amoxicillin-clavulanate 875-125 mg  Miscellaneous: dextromethorphan-guaifenesin 30-600 mg   Medication Assistance Findings:  Since patient has dual coverage with Medicare/Medicaid, she is not eligible for any patient assistance. Ruthe Mannan is nonformulary  and Dexilant is tier 3 and requires proof of step therapy on [employer], so they are not covered. Cheaper alternatives exist.  No medication issues noted besides lack of coverage.   Plan:  -Called Dr. Gustavus Bryant office and left a message informing him that Ruthe Mannan is not formulary on [employer], and that the prescription should be changed to a therapeutic alternative that is formulary, such as Symbicort or Breo (both are tier 3 on [employer]'s formulary)  -Called Dr. Gwynn Burly office and left a message informing him that Dexilant is tier 3 and requires proof of step therapy with other PPIs in order to be covered. As an alternative, HTA will cover pantoprazole.  (Patient said she had already tried omeprazole and Nexium without success).    Cleotis Lema, PharmD Candidate  -Denyse Amass, PharmD will follow up with the patient in 1 business day.   Elayne Guerin, PharmD, [place] Clinical Pharmacist [phone]

## 2018-04-23 NOTE — Telephone Encounter (Signed)
Copied from [place] [phone]. Topic: Quick Communication - See Telephone Encounter >> Apr 23, 2018  1:41 PM Rutherford Nail, [place] wrote: CRM for notification. See Telephone encounter for: 04/23/18. Alwyn Ren, Pharmacist with [facility], calling and states that the dexlansoprazole (DEXILANT) 60 MG capsule requires step therapy for insurance (medicare part D). States that pantoprazole is a medication that insurance will cover. States that whatever medication that Dr Jenny Reichmann would like to prescribe, please send to [id number] - [place], [place]  CB#: [phone]

## 2018-04-24 ENCOUNTER — Other Ambulatory Visit: Payer: Self-pay | Admitting: Pharmacist

## 2018-04-24 ENCOUNTER — Ambulatory Visit: Payer: Self-pay | Admitting: Pharmacist

## 2018-04-24 MED ORDER — BUDESONIDE-FORMOTEROL FUMARATE 80-4.5 MCG/ACT IN AERO: 2.0000 | INHALATION_SPRAY | Freq: Two times a day (BID) | RESPIRATORY_TRACT | 11 refills | Status: DC

## 2018-04-24 NOTE — Patient Outreach (Signed)
[facility]) Care Management  04/24/2018  Miasia Crabtree 10/31/1951 [id number]   Patient was called to follow up on medication assistance with [facility] and Dexilant. Unfortunately, patient did not answer the phone. HIPAA compliant message was left on her voicemail.  Ruthe Mannan is non formulary on [employer]'s formulary.  Dexilant requires step therapy.  Dr. Melvyn Novas and John's offices were called yesterday.  Dr. Gwynn Burly office sent a pantoprazole prescription to replace Dexilant to [facility]. (They were asked to send the prescription to [facility] on [facility]).  [facility] was called. They reported the pantoprazole prescription was ready and has a $3.40 copay for 90 tablets.  Dr. Gustavus Bryant office called back from my call yesterday.  The need for a therapeutic substitution was explained.  Awaiting a call back.  Plan: Follow up with the patient in 1 business day because she said she was out of her inhaler.

## 2018-04-24 NOTE — Telephone Encounter (Signed)
Andrea Mason [facility] [phone]

## 2018-04-24 NOTE — Telephone Encounter (Signed)
Spoke with Andrea Mason with [facility]. She is aware of Dr. Gustavus Bryant recommendation. Rx has been sent in. Nothing further was needed.

## 2018-04-24 NOTE — Telephone Encounter (Signed)
Called and spoke to [place] with [facility].  Alwyn Ren states that Ruthe Mannan is not covered by [employer], covered alternatives are Symbicort or Breo. preferred pharmacy is [facility] on [address].   Dr. Melvyn Novas please advise. Thanks

## 2018-04-24 NOTE — Telephone Encounter (Signed)
symbicort 80 2bid 

## 2018-04-24 NOTE — Telephone Encounter (Signed)
Attempted to call Katina with [facility]. I did not receive an answer. I have left a message for Alwyn Ren to return our call.

## 2018-04-25 ENCOUNTER — Other Ambulatory Visit: Payer: Self-pay | Admitting: Pharmacist

## 2018-04-25 NOTE — Patient Outreach (Addendum)
[facility]) Care Management  04/25/2018  Dicie Edelen 1950/12/05 [id number]   Patient was called to follow up on medication assistance. HIPAA identifiers were obtained.  Patient said she was able to pick up the Symbicort prescription and was very happy. She wondered about the medication for GERD and was instructed that Dr. Gwynn Burly office called pantoprazole in to the [facility].  Patient said she would go to [facility] and pick up the other prescription today.  She was instructed to take her medication bottle to [facility] before her next fill and have them transfer the prescription since she prefers [facility] as they are closer to her home.  Plan: Close pharmacy case as her medication assistance issues were resolved.  Ruthe Mannan was changed to Symbicort and Dexilant was changed to Pantoprazole.)  Case closure letter sent to patient and provider.  Elayne Guerin, PharmD, [place] Clinical Pharmacist [phone]

## 2018-05-23 ENCOUNTER — Encounter: Payer: Self-pay | Admitting: Gastroenterology

## 2018-05-30 ENCOUNTER — Ambulatory Visit (INDEPENDENT_AMBULATORY_CARE_PROVIDER_SITE_OTHER): Payer: PPO | Admitting: Internal Medicine

## 2018-05-30 ENCOUNTER — Encounter: Payer: Self-pay | Admitting: Gastroenterology

## 2018-05-30 ENCOUNTER — Encounter: Payer: Self-pay | Admitting: Internal Medicine

## 2018-05-30 ENCOUNTER — Ambulatory Visit: Payer: Self-pay | Admitting: Internal Medicine

## 2018-05-30 ENCOUNTER — Other Ambulatory Visit (INDEPENDENT_AMBULATORY_CARE_PROVIDER_SITE_OTHER): Payer: PPO

## 2018-05-30 VITALS — BP 126/78 | HR 80 | Temp 98.2°F | Ht 61.0 in | Wt 179.0 lb

## 2018-05-30 DIAGNOSIS — Z0001 Encounter for general adult medical examination with abnormal findings: Secondary | ICD-10-CM | POA: Diagnosis not present

## 2018-05-30 DIAGNOSIS — Z114 Encounter for screening for human immunodeficiency virus [HIV]: Secondary | ICD-10-CM

## 2018-05-30 DIAGNOSIS — E559 Vitamin D deficiency, unspecified: Secondary | ICD-10-CM | POA: Diagnosis not present

## 2018-05-30 DIAGNOSIS — Z0000 Encounter for general adult medical examination without abnormal findings: Secondary | ICD-10-CM

## 2018-05-30 DIAGNOSIS — R739 Hyperglycemia, unspecified: Secondary | ICD-10-CM | POA: Diagnosis not present

## 2018-05-30 DIAGNOSIS — R21 Rash and other nonspecific skin eruption: Secondary | ICD-10-CM | POA: Diagnosis not present

## 2018-05-30 DIAGNOSIS — Z1159 Encounter for screening for other viral diseases: Secondary | ICD-10-CM

## 2018-05-30 LAB — CBC WITH DIFFERENTIAL/PLATELET
Basophils Absolute: 0.1 10*3/uL (ref 0.0–0.1)
Basophils Relative: 1.1 % (ref 0.0–3.0)
EOS ABS: 0.9 10*3/uL — AB (ref 0.0–0.7)
EOS PCT: 15.7 % — AB (ref 0.0–5.0)
HCT: 36.7 % (ref 36.0–46.0)
Hemoglobin: 12.4 g/dL (ref 12.0–15.0)
LYMPHS ABS: 1.8 10*3/uL (ref 0.7–4.0)
Lymphocytes Relative: 30.5 % (ref 12.0–46.0)
MCHC: 33.8 g/dL (ref 30.0–36.0)
MCV: 91.4 fl (ref 78.0–100.0)
MONO ABS: 0.4 10*3/uL (ref 0.1–1.0)
Monocytes Relative: 6.2 % (ref 3.0–12.0)
NEUTROS PCT: 46.5 % (ref 43.0–77.0)
Neutro Abs: 2.8 10*3/uL (ref 1.4–7.7)
Platelets: 210 10*3/uL (ref 150.0–400.0)
RBC: 4.01 Mil/uL (ref 3.87–5.11)
RDW: 12.8 % (ref 11.5–15.5)
WBC: 5.9 10*3/uL (ref 4.0–10.5)

## 2018-05-30 LAB — HEPATIC FUNCTION PANEL
ALBUMIN: 4.2 g/dL (ref 3.5–5.2)
ALT: 9 U/L (ref 0–35)
AST: 13 U/L (ref 0–37)
Alkaline Phosphatase: 106 U/L (ref 39–117)
Bilirubin, Direct: 0.3 mg/dL (ref 0.0–0.3)
Total Bilirubin: 1.1 mg/dL (ref 0.2–1.2)
Total Protein: 7.8 g/dL (ref 6.0–8.3)

## 2018-05-30 LAB — VITAMIN D 25 HYDROXY (VIT D DEFICIENCY, FRACTURES): VITD: 33.83 ng/mL (ref 30.00–100.00)

## 2018-05-30 LAB — BASIC METABOLIC PANEL
BUN: 13 mg/dL (ref 6–23)
CALCIUM: 9.7 mg/dL (ref 8.4–10.5)
CO2: 30 meq/L (ref 19–32)
Chloride: 101 mEq/L (ref 96–112)
Creatinine, Ser: 0.98 mg/dL (ref 0.40–1.20)
GFR: 73.16 mL/min (ref >60.00)
Glucose, Bld: 87 mg/dL (ref 70–99)
Potassium: 3.8 mEq/L (ref 3.5–5.1)
SODIUM: 137 meq/L (ref 135–145)

## 2018-05-30 LAB — TSH: TSH: 1.59 u[IU]/mL (ref 0.35–4.50)

## 2018-05-30 LAB — HEMOGLOBIN A1C: Hgb A1c MFr Bld: 5.8 % (ref 4.6–6.5)

## 2018-05-30 LAB — LIPID PANEL
Cholesterol: 92 mg/dL (ref 0–200)
HDL: 59.4 mg/dL (ref >39.00)
LDL Cholesterol: 23 mg/dL (ref 0–99)
NonHDL: 33.01
Total CHOL/HDL Ratio: 2
Triglycerides: 49 mg/dL (ref 0.0–149.0)
VLDL: 9.8 mg/dL (ref 0.0–40.0)

## 2018-05-30 MED ORDER — METHYLPREDNISOLONE ACETATE 80 MG/ML IJ SUSP
80.0000 mg | Freq: Once | INTRAMUSCULAR | Status: AC
  Administered 2018-05-30: 80 mg via INTRAMUSCULAR

## 2018-05-30 MED ORDER — PREDNISONE 10 MG PO TABS: ORAL_TABLET | ORAL | 0 refills | Status: DC

## 2018-05-30 NOTE — Telephone Encounter (Signed)
Pt  Noted a reed rash to her left arm 2 nights ago. She stated that the rash goes from her shoulder to her wrists. Pt stated that the rash is red and blisters appeared yesterday and last night. The blisters have went away today. Pt tried triamcinolone cream to help with rash.  She stated that the spots are not painful but are itchy. Pt states there is swelling under the left wrist. Pt also c/o tingling to her left side of her back. Per pt no rash noted.  Care advice given. Appt made for today at 2:40 with PCP. Reason for Disposition . [1] Looks infected (spreading redness, pus) AND [2] no fever  Answer Assessment - Initial Assessment Questions 1. APPEARANCE of RASH: "Describe the rash."      Red with red spots yesterday had blisters 2. LOCATION: "Where is the rash located?"       Left arm to wrist 3. NUMBER: "How many spots are there?"      6 maybe more 4. SIZE: "How big are the spots?" (Inches, centimeters or compare to size of a coin)      Bumps are pencil erasure and others are splotchy with red 5. ONSET: "When did the rash start?"      2 nights ago 6. ITCHING: "Does the rash itch?" If so, ask: "How bad is the itch?"  (Scale 1-10; or mild, moderate, severe)     Yes-moderate 7. PAIN: "Does the rash hurt?" If so, ask: "How bad is the pain?"  (Scale 1-10; or mild, moderate, severe)     no 8. OTHER SYMPTOMS: "Do you have any other symptoms?" (e.g., fever)     no 9. PREGNANCY: "Is there any chance you are pregnant?" "When was your last menstrual period?"     n/a  Protocols used: RASH OR REDNESS - LOCALIZED-A-AH

## 2018-05-30 NOTE — Patient Instructions (Signed)
Please take all new medication as prescribed - the prednisone  You had the steroid shot today  Please continue all other medications as before, and refills have been done if requested.  Please have the pharmacy call with any other refills you may need.  Please continue your efforts at being more active, low cholesterol diet, and weight control.  You are otherwise up to date with prevention measures today.  Please keep your appointments with your specialists as you may have planned  Please go to the LAB in the Basement (turn left off the elevator) for the tests to be done today  You will be contacted by phone if any changes need to be made immediately.  Otherwise, you will receive a letter about your results with an explanation, but please check with MyChart first.  Please remember to sign up for MyChart if you have not done so, as this will be important to you in the future with finding out test results, communicating by private email, and scheduling acute appointments online when needed.  Please return in 1 year for your yearly visit, or sooner if needed, with Lab testing done 3-5 days before

## 2018-05-30 NOTE — Assessment & Plan Note (Signed)
Also for Viit D level f/u with labs

## 2018-05-30 NOTE — Assessment & Plan Note (Signed)

## 2018-05-30 NOTE — Progress Notes (Signed)
Subjective:    Patient ID: Andrea Mason, female    DOB: [birth year], 67 y.o.   MRN: [id number]  HPI  Here for wellness and f/u;  Overall doing ok;  Pt denies Chest pain, worsening SOB, DOE, wheezing, orthopnea, PND, worsening LE edema, palpitations, dizziness or syncope.  Pt denies neurological change such as new headache, facial or extremity weakness.  Pt denies polydipsia, polyuria, or low sugar symptoms. Pt states overall good compliance with treatment and medications, good tolerability, and has been trying to follow appropriate diet.  Pt denies worsening depressive symptoms, suicidal ideation or panic. No fever, night sweats, wt loss, loss of appetite, or other constitutional symptoms.  Pt states good ability with ADL's, has low fall risk, home safety reviewed and adequate, no other significant changes in hearing or vision, and only occasionally active with exercise.   Also has rash to left arm with marked redness and itching for 3 days after used a new moisturizer to that area of the body only, no fever or red streaks Past Medical History:  Diagnosis Date  . Allergic rhinitis   . Asthma   . Complication of anesthesia   . Depression   . DJD (degenerative joint disease), cervical   . GERD (gastroesophageal reflux disease)   . History of colonic polyps   . Hypertension   . Obesity   . 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 Stroke ([place])   . Syncope    Past Surgical History:  Procedure Laterality Date  . FOOT SURGERY  1998  . 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 LOOP RECORDER REMOVAL N/A 10/02/2017   Procedure: LOOP RECORDER REMOVAL;  Surgeon: [name], MD;  Location: [facility];  Service: Cardiovascular;  Laterality: N/A;  . NASAL POLYP SURGERY  07/2006   x 2 with Dr. [name]  . NASAL SINUS SURGERY  07/2006   Dr. [name]  . TUBAL LIGATION      reports that she has never  smoked. She has never used smokeless tobacco. She reports that she drinks about 0.6 oz of alcohol per week. She reports that she does not use drugs. family history includes Arrhythmia in her brother; Cancer in her mother; Colon cancer in her brother; Congenital heart disease in her brother; Diabetes in her sister; Heart attack in her brother; Lung disease in her son; Stroke in her brother. Allergies  Allergen Reactions  . Tramadol Other (See Comments)    Almost passed out  . Ivp Dye [Iodinated Diagnostic Agents] Nausea And Vomiting and Other (See Comments)    Reaction: hot flashes  . Promethazine-Codeine Nausea And Vomiting   Current Outpatient Medications on File Prior to Visit  Medication Sig Dispense Refill  . acetaminophen (TYLENOL) 325 MG tablet Take 325 mg by mouth every 6 (six) hours as needed for mild pain.     Marland Kitchen albuterol (PROVENTIL HFA;VENTOLIN HFA) 108 (90 Base) MCG/ACT inhaler Inhale 2 puffs into the lungs every 6 (six) hours as needed for wheezing or shortness of breath. 1 Inhaler 3  . aspirin 325 MG tablet Take 325 mg by mouth daily.    . Biotin 10000 MCG TABS Take 10,000 mcg by mouth daily.    . cetirizine (ZYRTEC ALLERGY) 10 MG tablet Take 10 mg by mouth at bedtime.     . Cholecalciferol (VITAMIN D3) 2000 units TABS Take 2,000 Units by mouth daily.    . Cyanocobalamin (  VITAMIN B-12) 5000 MCG TBDP Take 5,000 mcg by mouth daily.    Marland Kitchen dextromethorphan-guaiFENesin (MUCINEX DM) 30-600 MG 12hr tablet Take 1-2 tablets by mouth 2 (two) times daily as needed (for congestion/cough).    . fesoterodine (TOVIAZ) 4 MG TB24 tablet Take 1 tablet (4 mg total) by mouth daily. (Patient taking differently: Take 4 mg by mouth at bedtime. ) 90 tablet 3  . Magnesium 250 MG TABS Take 250 mg by mouth daily with lunch.    . mometasone (NASONEX) 50 MCG/ACT nasal spray Place 2 sprays into the nose 2 (two) times daily. 17 g 1  . mometasone-formoterol (DULERA) 100-5 MCG/ACT AERO Inhale 2 puffs into the  lungs 2 (two) times daily. 1 Inhaler 3  . montelukast (SINGULAIR) 10 MG tablet Take 1 tablet (10 mg total) by mouth at bedtime. 30 tablet 11  . oxymetazoline (AFRIN) 0.05 % nasal spray Place 2 sprays into both nostrils 2 (two) times daily as needed (nasal stuffiness and nasal pressure for 5 days).     . pantoprazole (PROTONIX) 40 MG tablet Take 1 tablet (40 mg total) by mouth daily. 90 tablet 3  . Pitavastatin Calcium (LIVALO) 4 MG TABS Take 1 tablet (4 mg total) by mouth daily. 90 tablet 1  . Probiotic Product (PROBIOTIC PO) Take 1 capsule by mouth daily with lunch. Women's Probiotic    . ranitidine (ZANTAC) 150 MG tablet Take 150 mg by mouth at bedtime.    . sodium chloride (OCEAN) 0.65 % SOLN nasal spray Place 2 sprays into both nostrils as needed (dry nose).     . Soft Lens Products (REWETTING DROPS) SOLN Place 1-2 drops into both eyes daily as needed (for dry eyes.).    Marland Kitchen spironolactone (ALDACTONE) 25 MG tablet Take 25 mg by mouth daily.      No current facility-administered medications on file prior to visit.    Review of Systems Constitutional: Negative for other unusual diaphoresis, sweats, appetite or weight changes HENT: Negative for other worsening hearing loss, ear pain, facial swelling, mouth sores or neck stiffness.   Eyes: Negative for other worsening pain, redness or other visual disturbance.  Respiratory: Negative for other stridor or swelling Cardiovascular: Negative for other palpitations or other chest pain  Gastrointestinal: Negative for worsening diarrhea or loose stools, blood in stool, distention or other pain Genitourinary: Negative for hematuria, flank pain or other change in urine volume.  Musculoskeletal: Negative for myalgias or other joint swelling.  Skin: Negative for other color change, or other wound or worsening drainage.  Neurological: Negative for other syncope or numbness. Hematological: Negative for other adenopathy or swelling Psychiatric/Behavioral:  Negative for hallucinations, other worsening agitation, SI, self-injury, or new decreased concentration All other system neg per pt    Objective:   Physical Exam BP 126/78   Pulse 80   Temp 98.2 F (36.8 C) (Oral)   Ht 5\' 1"  (1.549 m)   Wt 179 lb (81.2 kg)   SpO2 98%   BMI 33.82 kg/m  VS noted,  Constitutional: Pt is oriented to person, place, and time. Appears well-developed and well-nourished, in no significant distress and comfortable Head: Normocephalic and atraumatic  Eyes: Conjunctivae and EOM are normal. Pupils are equal, round, and reactive to light Right Ear: External ear normal without discharge Left Ear: External ear normal without discharge Nose: Nose without discharge or deformity Mouth/Throat: Oropharynx is without other ulcerations and moist  Neck: Normal range of motion. Neck supple. No JVD present. No tracheal deviation present  or significant neck LA or mass Cardiovascular: Normal rate, regular rhythm, normal heart sounds and intact distal pulses.   Pulmonary/Chest: WOB normal and breath sounds without rales or wheezing  Abdominal: Soft. Bowel sounds are normal. NT. No HSM  Musculoskeletal: Normal range of motion. Exhibits no edema Lymphadenopathy: Has no other cervical adenopathy.  Neurological: Pt is alert and oriented to person, place, and time. Pt has normal reflexes. No cranial nerve deficit. Motor grossly intact, Gait intact Skin: Skin is warm and dry. No rash noted or new ulcerations Psychiatric:  Has normal mood and affect. Behavior is normal without agitation No other exam findings Lab Results  Component Value Date   WBC 5.9 05/30/2018   HGB 12.4 05/30/2018   HCT 36.7 05/30/2018   PLT 210.0 05/30/2018   GLUCOSE 87 05/30/2018   CHOL 92 05/30/2018   TRIG 49.0 05/30/2018   HDL 59.40 05/30/2018   LDLCALC 23 05/30/2018   ALT 9 05/30/2018   AST 13 05/30/2018   NA 137 05/30/2018   K 3.8 05/30/2018   CL 101 05/30/2018   CREATININE 0.98 05/30/2018    BUN 13 05/30/2018   CO2 30 05/30/2018   TSH 1.59 05/30/2018   INR 1.02 05/25/2015   HGBA1C 5.8 05/30/2018       Assessment & Plan:

## 2018-05-30 NOTE — Assessment & Plan Note (Addendum)
C/w hypersensitivity reaction or contact dermatitis to the LUE, only in the area where a new moisturizing cream was used;  To stop the cream immediately, for depomedrol IM 80, predpac asd,  to f/u any worsening symptoms or concerns  In addition to the time spent performing CPE, I spent an additional 15 minutes face to face,in which greater than 50% of this time was spent in counseling and coordination of care for patient's illness as documented, including the differential dx, treatment, further evaluation and other management of rash, hyperglycemia, and Vit D deficiency

## 2018-05-30 NOTE — Assessment & Plan Note (Signed)
stable overall by history and exam, recent data reviewed with pt, and pt to continue medical treatment as before,  to f/u any worsening symptoms or concerns  

## 2018-05-31 LAB — HEPATITIS C ANTIBODY
Hepatitis C Ab: NONREACTIVE
SIGNAL TO CUT-OFF: 0.06 (ref <1.00)

## 2018-05-31 LAB — HIV ANTIBODY (ROUTINE TESTING W REFLEX): HIV 1&2 Ab, 4th Generation: NONREACTIVE

## 2018-06-05 DIAGNOSIS — B019 Varicella without complication: Secondary | ICD-10-CM | POA: Diagnosis not present

## 2018-06-05 DIAGNOSIS — B029 Zoster without complications: Secondary | ICD-10-CM | POA: Diagnosis not present

## 2018-06-07 ENCOUNTER — Other Ambulatory Visit (INDEPENDENT_AMBULATORY_CARE_PROVIDER_SITE_OTHER): Payer: PPO

## 2018-06-07 ENCOUNTER — Ambulatory Visit: Payer: PPO | Admitting: Family

## 2018-06-07 DIAGNOSIS — Z0000 Encounter for general adult medical examination without abnormal findings: Secondary | ICD-10-CM

## 2018-06-07 LAB — URINALYSIS, ROUTINE W REFLEX MICROSCOPIC
Bilirubin Urine: NEGATIVE
Hgb urine dipstick: NEGATIVE
Ketones, ur: NEGATIVE
Leukocytes, UA: NEGATIVE
NITRITE: NEGATIVE
PH: 6 (ref 5.0–8.0)
RBC / HPF: NONE SEEN (ref 0–?)
SPECIFIC GRAVITY, URINE: 1.01 (ref 1.000–1.030)
TOTAL PROTEIN, URINE-UPE24: NEGATIVE
URINE GLUCOSE: NEGATIVE
Urobilinogen, UA: 0.2 (ref 0.0–1.0)

## 2018-06-12 ENCOUNTER — Telehealth: Payer: Self-pay | Admitting: Internal Medicine

## 2018-06-12 NOTE — Telephone Encounter (Signed)
Called spoke with patient - she has not been seen in the office since Jan 2019 and was recommended to follow up w/ TP for med cal in 3 months.  Advised patient would be best for her to come if for appt with MW  Appt scheduled for tomorrow 7.11.19 @ 1500 Med calendar appt scheduled 7.30.19 @ 4718 with TP  Nothing further needed; will sign off

## 2018-06-13 ENCOUNTER — Encounter: Payer: Self-pay | Admitting: Internal Medicine

## 2018-06-13 ENCOUNTER — Ambulatory Visit (INDEPENDENT_AMBULATORY_CARE_PROVIDER_SITE_OTHER): Payer: PPO | Admitting: Internal Medicine

## 2018-06-13 VITALS — BP 118/82 | HR 84 | Temp 98.2°F | Ht 62.0 in | Wt 176.6 lb

## 2018-06-13 DIAGNOSIS — J45991 Cough variant asthma: Secondary | ICD-10-CM | POA: Diagnosis not present

## 2018-06-13 DIAGNOSIS — R05 Cough: Secondary | ICD-10-CM

## 2018-06-13 DIAGNOSIS — J328 Other chronic sinusitis: Secondary | ICD-10-CM | POA: Diagnosis not present

## 2018-06-13 DIAGNOSIS — R058 Other specified cough: Secondary | ICD-10-CM

## 2018-06-13 NOTE — Progress Notes (Signed)
Subjective:   Patient ID: Andrea Mason, female    DOB: 05-14-1951   MRN: [id number]  Brief patient profile:  44  yobf never smoker followed by Dr Loretha Brasil for allergic rhinitis and asthma     History of Present Illness  02/05/2013 1st pulmonary eval in EPIC era baseline = completely better on qvar 80 2bid and only use rescue once a week at baseline then much worse starting 02/03/13 with cough/ congestion/ green mucus and rescue x one by time of ov at 2pm.  No resting sob, very hoarse with harsh barking cough >Augmentin rx      12/18/2017  f/u ov/Wert re:  Asthma/ chronic rhinitis / doing better following med calendar  Chief Complaint  Patient presents with  . Follow-up    slight cough, red colored mucus when blowing her nose, feels like z pac and prednisone helped after her last visit   very rare saba need  Not limited by breathing from desired activities   Sleeping fine  rec 3 month f/u [name] P re med calendar/ reconciliation > did not do    06/13/2018 acute extended ov/Wert re: cough x one week > dark mucus with nasal congestion/ acute onset  Chief Complaint  Patient presents with  . Acute Visit    coughing up black mucus,, sinus infection, nasal congestion   breathing is ok  Has calendar but not following contingency instructions re rx for cough with nasty sputum, has augmentin refillable thru 12/2018  No obvious day to day or daytime variability or assoc   hemoptysis or cp or chest tightness, subjective wheeze or overt   hb symptoms.   Sleeping without nocturnal  or early am exacerbation  of respiratory  c/o's or need for noct saba. Also denies any obvious fluctuation of symptoms with weather or environmental changes or other aggravating or alleviating factors except as outlined above   No unusual exposure hx or h/o childhood pna/ asthma or knowledge of premature birth.  Current Allergies, Complete Past Medical History, Past Surgical History, Family History, and Social  History were reviewed in [employer] record.  ROS  The following are not active complaints unless bolded Hoarseness, sore throat, dysphagia, dental problems, itching, sneezing,  nasal congestion or discharge of excess mucus or purulent secretions, ear ache,   fever, chills, sweats, unintended wt loss or wt gain, classically pleuritic or exertional cp,  orthopnea pnd or arm/hand swelling  or leg swelling, presyncope, palpitations, abdominal pain, anorexia, nausea, vomiting, diarrhea  or change in bowel habits or change in bladder habits, change in stools or change in urine, dysuria, hematuria,  rash, arthralgias, visual complaints, headache, numbness, weakness or ataxia or problems with walking or coordination,  change in mood or  memory.        Current Meds  Medication Sig  . acetaminophen (TYLENOL) 325 MG tablet Take 325 mg by mouth every 6 (six) hours as needed for mild pain.   Marland Kitchen albuterol (PROVENTIL HFA;VENTOLIN HFA) 108 (90 Base) MCG/ACT inhaler Inhale 2 puffs into the lungs every 6 (six) hours as needed for wheezing or shortness of breath.  Marland Kitchen aspirin 325 MG tablet Take 325 mg by mouth daily.  . Biotin 10000 MCG TABS Take 10,000 mcg by mouth daily.  . cetirizine (ZYRTEC ALLERGY) 10 MG tablet Take 10 mg by mouth at bedtime.   . Cholecalciferol (VITAMIN D3) 2000 units TABS Take 2,000 Units by mouth daily.  . Cyanocobalamin (VITAMIN B-12) 5000 MCG TBDP Take 5,000  mcg by mouth daily.  Marland Kitchen dextromethorphan-guaiFENesin (MUCINEX DM) 30-600 MG 12hr tablet Take 1-2 tablets by mouth 2 (two) times daily as needed (for congestion/cough).  . fesoterodine (TOVIAZ) 4 MG TB24 tablet Take 1 tablet (4 mg total) by mouth daily. (Patient taking differently: Take 4 mg by mouth at bedtime. )  . Magnesium 250 MG TABS Take 250 mg by mouth daily with lunch.  . mometasone (NASONEX) 50 MCG/ACT nasal spray Place 2 sprays into the nose 2 (two) times daily.  . mometasone-formoterol (DULERA) 100-5  MCG/ACT AERO Inhale 2 puffs into the lungs 2 (two) times daily.  . montelukast (SINGULAIR) 10 MG tablet Take 1 tablet (10 mg total) by mouth at bedtime.  Marland Kitchen oxymetazoline (AFRIN) 0.05 % nasal spray Place 2 sprays into both nostrils 2 (two) times daily as needed (nasal stuffiness and nasal pressure for 5 days).   . pantoprazole (PROTONIX) 40 MG tablet Take 1 tablet (40 mg total) by mouth daily.  . Pitavastatin Calcium (LIVALO) 4 MG TABS Take 1 tablet (4 mg total) by mouth daily.  . Probiotic Product (PROBIOTIC PO) Take 1 capsule by mouth daily with lunch. Women's Probiotic  . ranitidine (ZANTAC) 150 MG tablet Take 150 mg by mouth at bedtime.  . sodium chloride (OCEAN) 0.65 % SOLN nasal spray Place 2 sprays into both nostrils as needed (dry nose).   . Soft Lens Products (REWETTING DROPS) SOLN Place 1-2 drops into both eyes daily as needed (for dry eyes.).  Marland Kitchen spironolactone (ALDACTONE) 25 MG tablet Take 25 mg by mouth daily.   . [DISCONTINUED] predniSONE (DELTASONE) 10 MG tablet 3 tabs by mouth per day for 5 days                               Objective:     amb bf nad  06/13/2018         176  12/18/2017          170   07/11/16 159 lb 3.2 oz (72.2 kg)  07/11/16 160 lb (72.6 kg)  04/05/16 163 lb (73.9 kg)    Vital signs reviewed - Note on arrival 02 sats  99% on RA           HEENT: nl dentition,   and oropharynx. Nl external ear canals without cough reflex- severe  bilateral non-specific turbinate edema     NECK :  without JVD/Nodes/TM/ nl carotid upstrokes bilaterally   LUNGS: no acc muscle use,  Nl contour chest which is clear to A and P bilaterally without cough on insp or exp maneuvers   CV:  RRR  no s3 or murmur or increase in P2, and no edema   ABD:  soft and nontender with nl inspiratory excursion in the supine position. No bruits or organomegaly appreciated, bowel sounds nl  MS:  Nl gait/ ext warm without deformities, calf tenderness, cyanosis or clubbing No  obvious joint restrictions   SKIN: warm and dry without lesions    NEURO:  alert, approp, nl sensorium with  Dysarthria but no other obvious  motor or cerebellar deficits apparent.             Assessment & Plan:

## 2018-06-13 NOTE — Patient Instructions (Addendum)
Any time you note nasty augmentin > go ahead and take Augmentin 875 mg take one pill twice daily  X 10 days - take at breakfast and supper with large glass of water.  It would help reduce the usual side effects (diarrhea and yeast infections) if you ate cultured yogurt at lunch.   For cough > mucinex dm 1200 mg every 12 hours and use the flutter valve as much as possible   Call Dr [name] for follow up   See calendar for specific medication instructions and bring it back for each and every office visit for every healthcare provider you see.  Without it,  you may not receive the best quality medical care that we feel you deserve.  You will note that the calendar groups together  your maintenance  medications that are timed at particular times of the day.  Think of this as your checklist for what your doctor has instructed you to do until your next evaluation to see what benefit  there is  to staying on a consistent group of medications intended to keep you well.  The other group at the bottom is entirely up to you to use as you see fit  for specific symptoms that may arise between visits that require you to treat them on an as needed basis.  Think of this as your action plan or "what if" list.   Separating the top medications from the bottom group is fundamental to providing you adequate care going forward.     Keep your appt with [name] - call sooner if need

## 2018-06-14 ENCOUNTER — Encounter: Payer: Self-pay | Admitting: Internal Medicine

## 2018-06-14 DIAGNOSIS — R05 Cough: Secondary | ICD-10-CM | POA: Insufficient documentation

## 2018-06-14 DIAGNOSIS — R058 Other specified cough: Secondary | ICD-10-CM | POA: Insufficient documentation

## 2018-06-14 NOTE — Assessment & Plan Note (Signed)
CT 2012-chronic sinus disease - Referred back to Dr [name] 01/07/16 > could not see due to $ as has no insurance - 07/11/2016 placed on augmentin x 10 d cylces as part of her action plan and refilled 12/2017 x one year  Did not see shoemaker or activate action plan on med calendar as instructed so rec she do so now and f/u with [name] as planned   I had an extended discussion with the patient reviewing all relevant studies completed to date and  lasting 15 to 20 minutes of a 25 minute visit    See device teaching which extended face to face time for this visit   Each maintenance medication was reviewed in detail including most importantly the difference between maintenance and prns and under what circumstances the prns are to be triggered using an action plan format that is not reflected in the computer generated alphabetically organized AVS but trather by a customized med calendar that reflects the AVS meds with confirmed 100% correlation.   In addition, Please see AVS for unique instructions that I personally wrote and verbalized to the the pt in detail and then reviewed with pt  by my nurse highlighting any  changes in therapy recommended at today's visit to their plan of care.

## 2018-06-14 NOTE — Assessment & Plan Note (Signed)
-  med calendar 03/17/2013 > not using 10/24/2013 , 04/07/2014  > did not bring to clinic 10/20/14  Or 12/11/2014 > brought it 07/11/2016   - Spirometry 12/11/2014  Min abn mid flows only    - 06/13/2018  After extensive coaching inhaler device  effectiveness =    75% (short ti)  Despite suboptimal hfa, All goals of chronic asthma control met including optimal function and elimination of symptoms with minimal need for rescue therapy.  Contingencies discussed in full including contacting this office immediately if not controlling the symptoms using the rule of two's.

## 2018-06-14 NOTE — Assessment & Plan Note (Addendum)
Flared early July 2019 due to rhinosinusitis > see sep a/p   Per med calendar use mucinex dm 1200 mg bid / flutter valve to prevent airway trauma/ cyclical cough mimicking asthma flare

## 2018-06-21 ENCOUNTER — Telehealth: Payer: Self-pay | Admitting: Internal Medicine

## 2018-06-21 NOTE — Telephone Encounter (Signed)
Copied from [place] [id number]. Topic: Quick Communication - See Telephone Encounter >> Jun 21, 2018  2:50 PM Conception Chancy, NT wrote: CRM for notification. See Telephone encounter for: 06/21/18.  Patient is calling and states that the pharmacy is needing a prior authorization for Toviaz Fesoterodine. Please advise.  [facility] [address], [place] [address] [place] [address] Phone: [phone] Fax: [phone]

## 2018-06-24 NOTE — Telephone Encounter (Signed)
PA submitted  Key: [id number]

## 2018-06-25 NOTE — Telephone Encounter (Signed)
Toviaz 4mg  Approved  06/24/18-12/03/18

## 2018-07-02 ENCOUNTER — Ambulatory Visit (INDEPENDENT_AMBULATORY_CARE_PROVIDER_SITE_OTHER): Payer: PPO | Admitting: Adult Health

## 2018-07-02 ENCOUNTER — Encounter: Payer: Self-pay | Admitting: Adult Health

## 2018-07-02 DIAGNOSIS — J45991 Cough variant asthma: Secondary | ICD-10-CM

## 2018-07-02 DIAGNOSIS — J302 Other seasonal allergic rhinitis: Secondary | ICD-10-CM

## 2018-07-02 DIAGNOSIS — J3089 Other allergic rhinitis: Secondary | ICD-10-CM | POA: Diagnosis not present

## 2018-07-02 NOTE — Assessment & Plan Note (Signed)
Recent flare now improved   Plan  Patient Instructions  Follow med calendar closely and bring to each visit.  Follow up Dr. Melvyn Novas  In 3-4  months and As needed   Please contact office for sooner follow up if symptoms do not improve or worsen or seek emergency care

## 2018-07-02 NOTE — Progress Notes (Signed)
@Patient  ID: Andrea Mason, female    DOB: 02-26-1951, 67 y.o.   MRN: [id number]  Chief Complaint  Patient presents with  . Follow-up    Asthma     Referring provider: Biagio Borg, MD  HPI: 67 year old female never smoker followed for allergic rhinitis and asthma  TEST  Spirometry 12/11/2014  Min abn mid flows only   07/02/2018 Follow up : Asthma , [place] , Med Review  Patient presents for a 2-week follow-up.  Last visit patient had an asthmatic flare and acute sinusitis.  She was treated with a 10-day course of Augmentin.  Patient says she is feeling much better.  Breathing has returned back to baseline.  She denies any increased cough or wheezing. She remains on Dulera twice daily.  Denies any recent increased albuterol use.  We reviewed all her medications and organize them into a medication calendar with patient education.  She appears to be taking her medications correctly.   Allergies  Allergen Reactions  . Tramadol Other (See Comments)    Almost passed out  . Ivp Dye [Iodinated Diagnostic Agents] Nausea And Vomiting and Other (See Comments)    Reaction: hot flashes  . Promethazine-Codeine Nausea And Vomiting    Immunization History  Administered Date(s) Administered  . Influenza Split 09/03/2012  . Influenza Whole 09/27/2006, 08/23/2007, 11/16/2009, 08/25/2010  . Influenza,inj,Quad PF,6+ Mos 08/21/2013, 08/06/2014, 10/05/2015  . Pneumococcal Conjugate-13 12/02/2014  . Tdap 05/06/2013, 08/25/2015  . Zoster 05/06/2013    Past Medical History:  Diagnosis Date  . Allergic rhinitis   . Asthma   . Complication of anesthesia   . Depression   . DJD (degenerative joint disease), cervical   . GERD (gastroesophageal reflux disease)   . History of colonic polyps   . Hypertension   . Obesity   . 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 Stroke ([place])   . Syncope     Tobacco History: Social History   Tobacco Use  Smoking Status Never Smoker  Smokeless Tobacco  Never Used   Counseling given: Not Answered   Outpatient Medications Prior to Visit  Medication Sig Dispense Refill  . acetaminophen (TYLENOL) 325 MG tablet Per bottle as needed    . albuterol (PROVENTIL HFA;VENTOLIN HFA) 108 (90 Base) MCG/ACT inhaler Inhale 2 puffs into the lungs every 6 (six) hours as needed for wheezing or shortness of breath. 1 Inhaler 3  . amoxicillin-clavulanate (AUGMENTIN) 875-125 MG tablet Take 1 tablet by mouth 2 (two) times daily. For 10 days as needed for nasty mucus    . aspirin 325 MG tablet Take 325 mg by mouth daily.    . baclofen (LIORESAL) 10 MG tablet 1/2 every 6 hours as needed for muscle spasm    . Biotin 10000 MCG TABS Take 10,000 mcg by mouth daily.    . cetirizine (ZYRTEC ALLERGY) 10 MG tablet Take 10 mg by mouth at bedtime.     . Cholecalciferol (VITAMIN D3) 2000 units TABS Take 2,000 Units by mouth daily.    Marland Kitchen dextromethorphan-guaiFENesin (MUCINEX DM) 30-600 MG 12hr tablet Take 1-2 tablets by mouth 2 (two) times daily as needed (for congestion/cough (WITH FLUTTER VALVE)).     . fesoterodine (TOVIAZ) 4 MG TB24 tablet Take 1 tablet (4 mg total) by mouth daily. (Patient taking differently: Take 4 mg by mouth at bedtime. ) 90 tablet 3  . Magnesium 250 MG TABS Take 250 mg by mouth daily with lunch.    . mometasone (NASONEX) 50 MCG/ACT nasal spray  Place 2 sprays into the nose 2 (two) times daily. 17 g 1  . mometasone-formoterol (DULERA) 100-5 MCG/ACT AERO Inhale 2 puffs into the lungs 2 (two) times daily. 1 Inhaler 3  . montelukast (SINGULAIR) 10 MG tablet Take 1 tablet (10 mg total) by mouth at bedtime. 30 tablet 11  . oxymetazoline (AFRIN) 0.05 % nasal spray Place 2 sprays into both nostrils 2 (two) times daily as needed (nasal stuffiness and nasal pressure for 5 days).     . pantoprazole (PROTONIX) 40 MG tablet Take 1 tablet (40 mg total) by mouth daily. 90 tablet 3  . Pitavastatin Calcium (LIVALO) 4 MG TABS Take 1 tablet (4 mg total) by mouth daily. 90  tablet 1  . polyethylene glycol powder (MIRALAX) powder 1 capful daily as needed    . ranitidine (ZANTAC) 75 MG tablet Take 75 mg by mouth at bedtime.     . sodium chloride (OCEAN) 0.65 % SOLN nasal spray Place 2 sprays into both nostrils as needed (dry nose).     Marland Kitchen spironolactone (ALDACTONE) 25 MG tablet Take 25 mg by mouth daily.     . Cyanocobalamin (VITAMIN B-12) 5000 MCG TBDP Take 5,000 mcg by mouth daily.    . Probiotic Product (PROBIOTIC PO) Take 1 capsule by mouth daily with lunch. Women's Probiotic    . Soft Lens Products (REWETTING DROPS) SOLN Place 1-2 drops into both eyes daily as needed (for dry eyes.).     No facility-administered medications prior to visit.      Review of Systems  Constitutional:   No  weight loss, night sweats,  Fevers, chills, fatigue, or  lassitude.  HEENT:   No headaches,  Difficulty swallowing,  Tooth/dental problems, or  Sore throat,                No sneezing, itching, ear ache,  +nasal congestion, post nasal drip,   CV:  No chest pain,  Orthopnea, PND, swelling in lower extremities, anasarca, dizziness, palpitations, syncope.   GI  No heartburn, indigestion, abdominal pain, nausea, vomiting, diarrhea, change in bowel habits, loss of appetite, bloody stools.   Resp:  .  No chest wall deformity  Skin: no rash or lesions.  GU: no dysuria, change in color of urine, no urgency or frequency.  No flank pain, no hematuria   MS:  No joint pain or swelling.  No decreased range of motion.  No back pain.    Physical Exam  BP 118/80 (BP Location: Right Arm, Patient Position: Sitting, Cuff Size: Normal)   Pulse 88   Ht 5\' 2"  (1.575 m)   Wt 177 lb (80.3 kg)   SpO2 99%   BMI 32.37 kg/m   GEN: A/Ox3; pleasant , NAD, obese    HEENT:  [place]/AT,  EACs-clear, TMs-wnl, NOSE-clear, THROAT-clear, no lesions, no postnasal drip or exudate noted.   NECK:  Supple w/ fair ROM; no JVD; normal carotid impulses w/o bruits; no thyromegaly or nodules palpated; no  lymphadenopathy.    RESP  Clear  P & A; w/o, wheezes/ rales/ or rhonchi. no accessory muscle use, no dullness to percussion  CARD:  RRR, no m/r/g, no peripheral edema, pulses intact, no cyanosis or clubbing.  GI:   Soft & nt; nml bowel sounds; no organomegaly or masses detected.   Musco: Warm bil, no deformities or joint swelling noted.   Neuro: alert, no focal deficits noted.    Skin: Warm, no lesions or rashes    Lab Results:  CBC  BNP No results found for: BNP  ProBNP No results found for: PROBNP  Imaging: No results found.   Assessment & Plan:   Cough variant asthma Improved control  Patient's medications were reviewed today and patient education was given. Computerized medication calendar was adjusted/completed   Plan  Patient Instructions  Follow med calendar closely and bring to each visit.  Follow up Dr. Melvyn Novas  In 3-4  months and As needed   Please contact office for sooner follow up if symptoms do not improve or worsen or seek emergency care       Seasonal and perennial allergic rhinitis Recent flare now improved   Plan  Patient Instructions  Follow med calendar closely and bring to each visit.  Follow up Dr. Melvyn Novas  In 3-4  months and As needed   Please contact office for sooner follow up if symptoms do not improve or worsen or seek emergency care          Rexene Edison, NP 07/02/2018

## 2018-07-02 NOTE — Assessment & Plan Note (Signed)
Improved control  Patient's medications were reviewed today and patient education was given. Computerized medication calendar was adjusted/completed   Plan  Patient Instructions  Follow med calendar closely and bring to each visit.  Follow up Dr. Melvyn Novas  In 3-4  months and As needed   Please contact office for sooner follow up if symptoms do not improve or worsen or seek emergency care

## 2018-07-02 NOTE — Patient Instructions (Signed)
Follow med calendar closely and bring to each visit.  Follow up Dr. Melvyn Novas  In 3-4  months and As needed   Please contact office for sooner follow up if symptoms do not improve or worsen or seek emergency care

## 2018-07-24 ENCOUNTER — Other Ambulatory Visit: Payer: Self-pay | Admitting: Internal Medicine

## 2018-07-24 DIAGNOSIS — J45991 Cough variant asthma: Secondary | ICD-10-CM

## 2018-08-06 ENCOUNTER — Encounter: Payer: PPO | Admitting: Gastroenterology
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
## 2018-09-05 ENCOUNTER — Other Ambulatory Visit: Payer: Self-pay | Admitting: Internal Medicine

## 2018-09-16 ENCOUNTER — Other Ambulatory Visit: Payer: Self-pay | Admitting: Internal Medicine

## 2018-09-16 MED ORDER — MOMETASONE FUROATE 50 MCG/ACT NA SUSP: 2.0000 | Freq: Two times a day (BID) | NASAL | 5 refills | Status: DC

## 2018-10-02 ENCOUNTER — Ambulatory Visit: Payer: PPO | Admitting: Internal Medicine

## 2018-10-09 ENCOUNTER — Other Ambulatory Visit: Payer: Self-pay | Admitting: Internal Medicine

## 2018-12-05 ENCOUNTER — Telehealth: Payer: Self-pay | Admitting: Internal Medicine

## 2018-12-05 NOTE — Telephone Encounter (Signed)
Call made to patient, requesting samples of Dulera 100. Sample and patient assistance application placed upfront for pick up. Informed patient to fill out all highlighted areas. Voiced understanding. Nothing further is needed at this time.

## 2018-12-06 NOTE — Telephone Encounter (Signed)
Pt did sign patient assistance application. Stated to pt once MW has signed his section, we will send the assistance application off for her. Pt expressed understanding. Nothing further needed

## 2018-12-07 ENCOUNTER — Other Ambulatory Visit: Payer: Self-pay | Admitting: Internal Medicine

## 2018-12-20 ENCOUNTER — Telehealth: Payer: Self-pay | Admitting: Internal Medicine

## 2018-12-20 NOTE — Telephone Encounter (Signed)
Called and spoke with patient regarding patient asst forms for [employer]. Pt did sign patient assistance application. Waiting on MW to sign the forms and return. Once completed by MW, need to fax it off to the company  MW, please advise once these forms have been signed and completed. Thank you.

## 2018-12-23 NOTE — Telephone Encounter (Signed)
Form mailed on 12/09/18 and has already been scanned in her chart  Left her a detailed msg letting her know this was done

## 2018-12-23 NOTE — Telephone Encounter (Signed)
MW please advise on this, thank you!

## 2018-12-23 NOTE — Telephone Encounter (Signed)
Don't see it on my desk

## 2019-01-14 ENCOUNTER — Telehealth: Payer: Self-pay | Admitting: Internal Medicine

## 2019-01-14 NOTE — Telephone Encounter (Signed)
Copied from [facility] [id number]. Topic: Quick Communication - Rx Refill/Question >> Jan 14, 2019  3:57 PM Ahmed Prima L wrote: Medication: TOVIAZ 4 MG TB24 tablet  Has the patient contacted their pharmacy? No because it is the assistance program and she was advised to call office.  (Agent: If no, request that the patient contact the pharmacy for the refill.) (Agent: If yes, when and what did the pharmacy advise?)  Preferred Pharmacy (with phone number or street name): [facility] ( medication assistance program ) [phone], her ID number is [id number]  Agent: Please be advised that RX refills may take up to 3 business days. We ask that you follow-up with your pharmacy.

## 2019-01-15 MED ORDER — FESOTERODINE FUMARATE ER 4 MG PO TB24: 4.0000 mg | ORAL_TABLET | Freq: Every day | ORAL | 1 refills | Status: DC

## 2019-01-23 ENCOUNTER — Other Ambulatory Visit: Payer: Self-pay | Admitting: Internal Medicine

## 2019-01-23 DIAGNOSIS — J45991 Cough variant asthma: Secondary | ICD-10-CM

## 2019-01-24 ENCOUNTER — Other Ambulatory Visit: Payer: Self-pay | Admitting: Internal Medicine

## 2019-01-24 DIAGNOSIS — J45991 Cough variant asthma: Secondary | ICD-10-CM

## 2019-01-24 MED ORDER — MONTELUKAST SODIUM 10 MG PO TABS: 10.0000 mg | ORAL_TABLET | Freq: Every day | ORAL | 0 refills | Status: DC

## 2019-01-29 ENCOUNTER — Ambulatory Visit: Payer: Self-pay | Admitting: *Deleted

## 2019-01-29 NOTE — Telephone Encounter (Signed)
[employer] told pt that they will be sending over a fax about getting this medication free.  Pt states that after the fax has been returned that we call the number on the fax so that the medication can be sent to the office for her to pick up there - she can no longer [facility] through the [facility] program.

## 2019-01-29 NOTE — Telephone Encounter (Signed)
Message from R[place]ford N[place], [place] sent at 01/29/2019 4:07 PM EST   Summary: itching   Patient calling and would like to know if itching all over, especially her back and arms, is a sign of diabetes? States that the itching feels like it starts on the inside. Please advise.           Called patient regarding message for itching. She has been itching for a while . Her friend stated that it looked like ringworm on her back. She also has itching on the front of her legs.  She denies fever or any other symptoms.  Home care advice given: try cool compresses to her back, use hydrocortisone cream, try to rub and not scratch to prevent breaking the skin. She also takes an antihistamine already Appointment scheduled per protocol.  Advised to call back if the itching becomes worst. Pt voiced understanding.   Reason for Disposition . [1] MODERATE-SEVERE local itching (i.e., interferes with work, school, activities) AND [2] not improved after 24 hours of hydrocortisone cream  Answer Assessment - Initial Assessment Questions 1. DESCRIPTION: "Describe the itching you are having." "Where is it located?"     Always itching. On the back and arms, legs 2. SEVERITY: "How bad is it?"    - MILD - doesn't interfere with normal activities   - MODERATE - SEVERE: interferes with work, school, sleep, or other activities      Mild to moderate 3. SCRATCHING: "Are there any scratch marks? Bleeding?"     Yes and bleeding sometimes 4. ONSET: "When did the itching begin?"      2014 5. CAUSE: "What do you think is causing the itching?"     Not sure, ringworm? 6. OTHER SYMPTOMS: "Do you have any other symptoms?"      none 7. PREGNANCY: "Is there any chance you are pregnant?" "When was your last menstrual period?"     no  Protocols used: ITCHING - LOCALIZED-A-AH

## 2019-01-30 NOTE — Telephone Encounter (Signed)
Noted  

## 2019-01-30 NOTE — Telephone Encounter (Signed)
Will keep an eye out for them forms

## 2019-01-31 ENCOUNTER — Encounter: Payer: Self-pay | Admitting: Internal Medicine

## 2019-01-31 ENCOUNTER — Ambulatory Visit (INDEPENDENT_AMBULATORY_CARE_PROVIDER_SITE_OTHER): Payer: PPO | Admitting: Internal Medicine

## 2019-01-31 VITALS — BP 120/80 | Temp 98.1°F | Ht 62.0 in | Wt 180.0 lb

## 2019-01-31 DIAGNOSIS — R739 Hyperglycemia, unspecified: Secondary | ICD-10-CM | POA: Diagnosis not present

## 2019-01-31 DIAGNOSIS — Z0000 Encounter for general adult medical examination without abnormal findings: Secondary | ICD-10-CM

## 2019-01-31 DIAGNOSIS — I10 Essential (primary) hypertension: Secondary | ICD-10-CM | POA: Diagnosis not present

## 2019-01-31 DIAGNOSIS — L659 Nonscarring hair loss, unspecified: Secondary | ICD-10-CM | POA: Diagnosis not present

## 2019-01-31 DIAGNOSIS — D170 Benign lipomatous neoplasm of skin and subcutaneous tissue of head, face and neck: Secondary | ICD-10-CM

## 2019-01-31 MED ORDER — TRIAMCINOLONE ACETONIDE 0.1 % EX CREA: 1.0000 "application " | TOPICAL_CREAM | Freq: Two times a day (BID) | CUTANEOUS | 1 refills | Status: AC | PRN

## 2019-01-31 NOTE — Assessment & Plan Note (Signed)
stable overall by history and exam, recent data reviewed with pt, and pt to continue medical treatment as before,  to f/u any worsening symptoms or concerns  

## 2019-01-31 NOTE — Patient Instructions (Signed)
Please take all new medication as prescribed - the cream for the worst itching areas  You will be contacted regarding the referral for: Dermaology  Please continue all other medications as before, and refills have been done if requested.  Please have the pharmacy call with any other refills you may need.  Please continue your efforts at being more active, low cholesterol diet, and weight control.  Please keep your appointments with your specialists as you may have planned

## 2019-01-31 NOTE — Progress Notes (Signed)
Subjective:    Patient ID: Andrea Mason, female    DOB: January 31, 1951, 68 y.o.   MRN: [id number]  HPI  Here to f/u; overall doing ok,  Pt denies chest pain, increasing sob or doe, wheezing, orthopnea, PND, increased LE swelling, palpitations, dizziness or syncope.  Pt denies new neurological symptoms such as new headache, or facial or extremity weakness or numbness.  Pt denies polydipsia, polyuria, or low sugar episode.  Pt states overall good compliance with meds, mostly trying to follow appropriate diet, with wt overall stable,  but little exercise however.  Due for colonoscopy.  Itches all over without rash or skin color change despite moisturizing lotions.  Also with 2 mo onset crown bald spot, no prior hx, asks for derm referral Past Medical History:  Diagnosis Date  . Allergic rhinitis   . Asthma   . Complication of anesthesia   . Depression   . DJD (degenerative joint disease), cervical   . GERD (gastroesophageal reflux disease)   . History of colonic polyps   . Hypertension   . Obesity   . Primary hyperaldosteronism ([place]) 11/06/2013  . Stroke ([place])   . Syncope    Past Surgical History:  Procedure Laterality Date  . FOOT SURGERY  1998  . 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 LOOP RECORDER REMOVAL N/A 10/02/2017   Procedure: LOOP RECORDER REMOVAL;  Surgeon: [name], MD;  Location: [facility];  Service: Cardiovascular;  Laterality: N/A;  . NASAL POLYP SURGERY  07/2006   x 2 with Dr. [name]  . NASAL SINUS SURGERY  07/2006   Dr. [name]  . TUBAL LIGATION      reports that she has never smoked. She has never used smokeless tobacco. She reports current alcohol use of about 1.0 standard drinks of alcohol per week. She reports that she does not use drugs. family history includes Arrhythmia in her brother; Cancer in her mother; Colon cancer in her brother; Congenital heart disease in  her brother; Diabetes in her sister; Heart attack in her brother; Lung disease in her son; Stroke in her brother. Allergies  Allergen Reactions  . Tramadol Other (See Comments)    Almost passed out  . Ivp Dye [Iodinated Diagnostic Agents] Nausea And Vomiting and Other (See Comments)    Reaction: hot flashes  . Promethazine-Codeine Nausea And Vomiting   Current Outpatient Medications on File Prior to Visit  Medication Sig Dispense Refill  . acetaminophen (TYLENOL) 325 MG tablet Per bottle as needed    . aspirin 325 MG tablet Take 325 mg by mouth daily.    . baclofen (LIORESAL) 10 MG tablet 1/2 every 6 hours as needed for muscle spasm    . Biotin 10000 MCG TABS Take 10,000 mcg by mouth daily.    . cetirizine (ZYRTEC ALLERGY) 10 MG tablet Take 10 mg by mouth at bedtime.     . Cholecalciferol (VITAMIN D3) 2000 units TABS Take 2,000 Units by mouth daily.    Marland Kitchen dextromethorphan-guaiFENesin (MUCINEX DM) 30-600 MG 12hr tablet Take 1-2 tablets by mouth 2 (two) times daily as needed (for congestion/cough (WITH FLUTTER VALVE)).     Marland Kitchen LIVALO 4 MG TABS TAKE 1 TABLET BY MOUTH EVERY DAY 90 tablet 1  . Magnesium 250 MG TABS Take 250 mg by mouth daily with lunch.    . mometasone (NASONEX) 50 MCG/ACT nasal spray Place 2 sprays into the  nose 2 (two) times daily. 17 g 5  . mometasone-formoterol (DULERA) 100-5 MCG/ACT AERO Inhale 2 puffs into the lungs 2 (two) times daily. 1 Inhaler 3  . montelukast (SINGULAIR) 10 MG tablet Take 1 tablet (10 mg total) by mouth at bedtime. 90 tablet 0  . oxymetazoline (AFRIN) 0.05 % nasal spray Place 2 sprays into both nostrils 2 (two) times daily as needed (nasal stuffiness and nasal pressure for 5 days).     . pantoprazole (PROTONIX) 40 MG tablet Take 1 tablet (40 mg total) by mouth daily. 90 tablet 3  . polyethylene glycol powder (MIRALAX) powder 1 capful daily as needed    . sodium chloride (OCEAN) 0.65 % SOLN nasal spray Place 2 sprays into both nostrils as needed (dry  nose).     Marland Kitchen spironolactone (ALDACTONE) 25 MG tablet Take 25 mg by mouth daily.     . TOVIAZ 4 MG TB24 tablet TAKE 1 TABLET BY MOUTH ONCE DAILY 90 tablet 1   No current facility-administered medications on file prior to visit.    Review of Systems  Constitutional: Negative for other unusual diaphoresis or sweats HENT: Negative for ear discharge or swelling Eyes: Negative for other worsening visual disturbances Respiratory: Negative for stridor or other swelling  Gastrointestinal: Negative for worsening distension or other blood Genitourinary: Negative for retention or other urinary change Musculoskeletal: Negative for other MSK pain or swelling Skin: Negative for color change or other new lesions Neurological: Negative for worsening tremors and other numbness  Psychiatric/Behavioral: Negative for worsening agitation or other fatigue All other system neg per pt    Objective:   Physical Exam BP 120/80   Temp 98.1 F (36.7 C) (Oral)   Ht 5\' 2"  (1.575 m)   Wt 180 lb (81.6 kg)   BMI 32.92 kg/m  VS noted,  Constitutional: Pt appears in NAD HENT: Head: NCAT.  Right Ear: External ear normal.  Left Ear: External ear normal.  Eyes: . Pupils are equal, round, and reactive to light. Conjunctivae and EOM are normal Nose: without d/c or deformity Neck: Neck supple. Gross normal ROM Cardiovascular: Normal rate and regular rhythm.   Pulmonary/Chest: Effort normal and breath sounds without rales or wheezing.  Abd:  Soft, NT, ND, + BS, no organomegaly Neurological: Pt is alert. At baseline orientation, motor grossly intact Skin: Skin is warm. No rashes but has patch of baldness at the crown, no LE edema, has lipoma to right lower paracervical post area.   Psychiatric: Pt behavior is normal without agitation  No other exam findings Lab Results  Component Value Date   WBC 5.9 05/30/2018   HGB 12.4 05/30/2018   HCT 36.7 05/30/2018   PLT 210.0 05/30/2018   GLUCOSE 87 05/30/2018   CHOL 92  05/30/2018   TRIG 49.0 05/30/2018   HDL 59.40 05/30/2018   LDLCALC 23 05/30/2018   ALT 9 05/30/2018   AST 13 05/30/2018   NA 137 05/30/2018   K 3.8 05/30/2018   CL 101 05/30/2018   CREATININE 0.98 05/30/2018   BUN 13 05/30/2018   CO2 30 05/30/2018   TSH 1.59 05/30/2018   INR 1.02 05/25/2015   HGBA1C 5.8 05/30/2018       Assessment & Plan:

## 2019-01-31 NOTE — Assessment & Plan Note (Signed)
[facility] for dermatology referral,  to f/u any worsening symptoms or concerns

## 2019-01-31 NOTE — Assessment & Plan Note (Signed)
Stable, cont to follow

## 2019-02-17 DIAGNOSIS — J0141 Acute recurrent pansinusitis: Secondary | ICD-10-CM | POA: Insufficient documentation

## 2019-02-17 DIAGNOSIS — J310 Chronic rhinitis: Secondary | ICD-10-CM | POA: Diagnosis not present

## 2019-02-17 DIAGNOSIS — R05 Cough: Secondary | ICD-10-CM | POA: Diagnosis not present

## 2019-03-03 ENCOUNTER — Other Ambulatory Visit: Payer: Self-pay | Admitting: Internal Medicine

## 2019-03-03 DIAGNOSIS — J45991 Cough variant asthma: Secondary | ICD-10-CM

## 2019-03-03 MED ORDER — MONTELUKAST SODIUM 10 MG PO TABS: 10.0000 mg | ORAL_TABLET | Freq: Every day | ORAL | 1 refills | Status: DC

## 2019-04-29 ENCOUNTER — Other Ambulatory Visit: Payer: Self-pay | Admitting: Internal Medicine

## 2019-05-13 DIAGNOSIS — H1013 Acute atopic conjunctivitis, bilateral: Secondary | ICD-10-CM | POA: Diagnosis not present

## 2019-05-13 DIAGNOSIS — H40033 Anatomical narrow angle, bilateral: Secondary | ICD-10-CM | POA: Diagnosis not present

## 2019-05-15 DIAGNOSIS — E269 Hyperaldosteronism, unspecified: Secondary | ICD-10-CM | POA: Diagnosis not present

## 2019-05-21 DIAGNOSIS — I10 Essential (primary) hypertension: Secondary | ICD-10-CM | POA: Diagnosis not present

## 2019-05-21 DIAGNOSIS — E269 Hyperaldosteronism, unspecified: Secondary | ICD-10-CM | POA: Diagnosis not present

## 2019-06-03 ENCOUNTER — Other Ambulatory Visit: Payer: Self-pay

## 2019-06-03 ENCOUNTER — Other Ambulatory Visit (INDEPENDENT_AMBULATORY_CARE_PROVIDER_SITE_OTHER): Payer: PPO

## 2019-06-03 ENCOUNTER — Encounter: Payer: Self-pay | Admitting: Internal Medicine

## 2019-06-03 ENCOUNTER — Ambulatory Visit (INDEPENDENT_AMBULATORY_CARE_PROVIDER_SITE_OTHER): Payer: PPO | Admitting: Internal Medicine

## 2019-06-03 VITALS — BP 108/78 | HR 74 | Temp 98.1°F | Ht 62.0 in | Wt 189.0 lb

## 2019-06-03 DIAGNOSIS — Z0000 Encounter for general adult medical examination without abnormal findings: Secondary | ICD-10-CM

## 2019-06-03 DIAGNOSIS — E559 Vitamin D deficiency, unspecified: Secondary | ICD-10-CM

## 2019-06-03 DIAGNOSIS — E538 Deficiency of other specified B group vitamins: Secondary | ICD-10-CM

## 2019-06-03 DIAGNOSIS — Z23 Encounter for immunization: Secondary | ICD-10-CM

## 2019-06-03 DIAGNOSIS — R739 Hyperglycemia, unspecified: Secondary | ICD-10-CM

## 2019-06-03 DIAGNOSIS — E611 Iron deficiency: Secondary | ICD-10-CM

## 2019-06-03 LAB — CBC WITH DIFFERENTIAL/PLATELET
Basophils Absolute: 0.1 10*3/uL (ref 0.0–0.1)
Basophils Relative: 1.3 % (ref 0.0–3.0)
Eosinophils Absolute: 0.9 10*3/uL — ABNORMAL HIGH (ref 0.0–0.7)
Eosinophils Relative: 13.8 % — ABNORMAL HIGH (ref 0.0–5.0)
HCT: 36.2 % (ref 36.0–46.0)
Hemoglobin: 12.1 g/dL (ref 12.0–15.0)
Lymphocytes Relative: 32.2 % (ref 12.0–46.0)
Lymphs Abs: 2 10*3/uL (ref 0.7–4.0)
MCHC: 33.4 g/dL (ref 30.0–36.0)
MCV: 92.7 fl (ref 78.0–100.0)
Monocytes Absolute: 0.4 10*3/uL (ref 0.1–1.0)
Monocytes Relative: 6.8 % (ref 3.0–12.0)
Neutro Abs: 2.9 10*3/uL (ref 1.4–7.7)
Neutrophils Relative %: 45.9 % (ref 43.0–77.0)
Platelets: 201 10*3/uL (ref 150.0–400.0)
RBC: 3.9 Mil/uL (ref 3.87–5.11)
RDW: 13.1 % (ref 11.5–15.5)
WBC: 6.3 10*3/uL (ref 4.0–10.5)

## 2019-06-03 LAB — VITAMIN B12: Vitamin B-12: >1500 pg/mL — ABNORMAL HIGH (ref 211–911)

## 2019-06-03 LAB — IBC PANEL
Iron: 44 ug/dL (ref 42–145)
Saturation Ratios: 10.9 % — ABNORMAL LOW (ref 20.0–50.0)
Transferrin: 289 mg/dL (ref 212.0–360.0)

## 2019-06-03 LAB — LIPID PANEL
Cholesterol: 79 mg/dL (ref 0–200)
HDL: 55.6 mg/dL (ref >39.00)
LDL Cholesterol: 9 mg/dL (ref 0–99)
NonHDL: 23.51
Total CHOL/HDL Ratio: 1
Triglycerides: 72 mg/dL (ref 0.0–149.0)
VLDL: 14.4 mg/dL (ref 0.0–40.0)

## 2019-06-03 LAB — VITAMIN D 25 HYDROXY (VIT D DEFICIENCY, FRACTURES): VITD: 41.39 ng/mL (ref 30.00–100.00)

## 2019-06-03 LAB — HEPATIC FUNCTION PANEL
ALT: 12 U/L (ref 0–35)
AST: 18 U/L (ref 0–37)
Albumin: 4.1 g/dL (ref 3.5–5.2)
Alkaline Phosphatase: 95 U/L (ref 39–117)
Bilirubin, Direct: 0.2 mg/dL (ref 0.0–0.3)
Total Bilirubin: 0.9 mg/dL (ref 0.2–1.2)
Total Protein: 7.4 g/dL (ref 6.0–8.3)

## 2019-06-03 LAB — BASIC METABOLIC PANEL
BUN: 13 mg/dL (ref 6–23)
CO2: 26 mEq/L (ref 19–32)
Calcium: 9.1 mg/dL (ref 8.4–10.5)
Chloride: 104 mEq/L (ref 96–112)
Creatinine, Ser: 0.98 mg/dL (ref 0.40–1.20)
GFR: 68.62 mL/min (ref >60.00)
Glucose, Bld: 76 mg/dL (ref 70–99)
Potassium: 3.9 mEq/L (ref 3.5–5.1)
Sodium: 137 mEq/L (ref 135–145)

## 2019-06-03 LAB — TSH: TSH: 1.05 u[IU]/mL (ref 0.35–4.50)

## 2019-06-03 MED ORDER — TRIAMCINOLONE ACETONIDE 55 MCG/ACT NA AERO: 2.0000 | INHALATION_SPRAY | Freq: Every day | NASAL | 12 refills | Status: DC

## 2019-06-03 NOTE — Assessment & Plan Note (Signed)
stable overall by history and exam, recent data reviewed with pt, and pt to continue medical treatment as before,  to f/u any worsening symptoms or concerns  

## 2019-06-03 NOTE — Patient Instructions (Addendum)
You had the Pneumovax pneumonia shot today  OK to change the flonase to Nasacort for the allergies  Please continue all other medications as before, and refills have been done if requested.  Please have the pharmacy call with any other refills you may need.  Please continue your efforts at being more active, low cholesterol diet, and weight control.  You are otherwise up to date with prevention measures today.  Please keep your appointments with your specialists as you may have planned  Please go to the LAB in the Basement (turn left off the elevator) for the tests to be done today  You will be contacted by phone if any changes need to be made immediately.  Otherwise, you will receive a letter about your results with an explanation, but please check with MyChart first.  Please remember to sign up for MyChart if you have not done so, as this will be important to you in the future with finding out test results, communicating by private email, and scheduling acute appointments online when needed.  Please return in 6 months, or sooner if needed

## 2019-06-03 NOTE — Assessment & Plan Note (Signed)

## 2019-06-03 NOTE — Progress Notes (Signed)
Subjective:    Patient ID: Andrea Mason, female    DOB: 10-Apr-1951, 68 y.o.   MRN: [id number]  HPI  Here for wellness and f/u;  Overall doing ok;  Pt denies Chest pain, worsening SOB, DOE, wheezing, orthopnea, PND, worsening LE edema, palpitations, dizziness or syncope.  Pt denies neurological change such as new headache, facial or extremity weakness.  Pt denies polydipsia, polyuria, or low sugar symptoms. Pt states overall good compliance with treatment and medications, good tolerability, and has been trying to follow appropriate diet.  Pt denies worsening depressive symptoms, suicidal ideation or panic. No fever, night sweats, wt loss, loss of appetite, or other constitutional symptoms.  Pt states good ability with ADL's, has low fall risk, home safety reviewed and adequate, no other significant changes in hearing or vision, and only occasionally active with exercise. Gained 10 lbs with less active, plans to do more.  No new complaints Wt Readings from Last 3 Encounters:  06/03/19 189 lb (85.7 kg)  01/31/19 180 lb (81.6 kg)  07/02/18 177 lb (80.3 kg)  Due for colonoscopy, plans to make appt herself later today Past Medical History:  Diagnosis Date   Allergic rhinitis    Asthma    Complication of anesthesia    Depression    DJD (degenerative joint disease), cervical    GERD (gastroesophageal reflux disease)    History of colonic polyps    Hypertension    Obesity    Primary hyperaldosteronism ([place]) 11/06/2013   Stroke ([place])    Syncope    Past Surgical History:  Procedure Laterality Date   FOOT SURGERY  1998   Implantable loop recorder placement  09/16/14   MDT LINQ implanted by Dr Rayann Heman for cryptogenic stroke, RIO II protocol   LOOP RECORDER REMOVAL N/A 10/02/2017   Procedure: LOOP RECORDER REMOVAL;  Surgeon: [name], MD;  Location: [facility];  Service: Cardiovascular;  Laterality: N/A;   NASAL POLYP SURGERY  07/2006   x 2 with Dr. [name]   NASAL  SINUS SURGERY  07/2006   Dr. [name]   TUBAL LIGATION      reports that she has never smoked. She has never used smokeless tobacco. She reports current alcohol use of about 1.0 standard drinks of alcohol per week. She reports that she does not use drugs. family history includes Arrhythmia in her brother; Cancer in her mother; Colon cancer in her brother; Congenital heart disease in her brother; Diabetes in her sister; Heart attack in her brother; Lung disease in her son; Stroke in her brother. Allergies  Allergen Reactions   Tramadol Other (See Comments)    Almost passed out   Ivp Dye [Iodinated Diagnostic Agents] Nausea And Vomiting and Other (See Comments)    Reaction: hot flashes   Promethazine-Codeine Nausea And Vomiting   Current Outpatient Medications on File Prior to Visit  Medication Sig Dispense Refill   acetaminophen (TYLENOL) 325 MG tablet Per bottle as needed     aspirin 325 MG tablet Take 325 mg by mouth daily.     baclofen (LIORESAL) 10 MG tablet 1/2 every 6 hours as needed for muscle spasm     Biotin 10000 MCG TABS Take 10,000 mcg by mouth daily.     cetirizine (ZYRTEC ALLERGY) 10 MG tablet Take 10 mg by mouth at bedtime.      Cholecalciferol (VITAMIN D3) 2000 units TABS Take 2,000 Units by mouth daily.     dextromethorphan-guaiFENesin (MUCINEX DM) 30-600 MG 12hr tablet Take  1-2 tablets by mouth 2 (two) times daily as needed (for congestion/cough (WITH FLUTTER VALVE)).      LIVALO 4 MG TABS TAKE 1 TABLET BY MOUTH EVERY DAY 90 tablet 1   mometasone (NASONEX) 50 MCG/ACT nasal spray Place 2 sprays into the nose 2 (two) times daily. 17 g 5   mometasone-formoterol (DULERA) 100-5 MCG/ACT AERO Inhale 2 puffs into the lungs 2 (two) times daily. 1 Inhaler 3   montelukast (SINGULAIR) 10 MG tablet Take 1 tablet (10 mg total) by mouth at bedtime. 90 tablet 1   oxymetazoline (AFRIN) 0.05 % nasal spray Place 2 sprays into both nostrils 2 (two) times daily as needed (nasal  stuffiness and nasal pressure for 5 days).      pantoprazole (PROTONIX) 40 MG tablet TAKE 1 TABLET BY MOUTH EVERY DAY 90 tablet 0   polyethylene glycol powder (MIRALAX) powder 1 capful daily as needed     sodium chloride (OCEAN) 0.65 % SOLN nasal spray Place 2 sprays into both nostrils as needed (dry nose).      spironolactone (ALDACTONE) 25 MG tablet Take 25 mg by mouth daily.      TOVIAZ 4 MG TB24 tablet TAKE 1 TABLET BY MOUTH ONCE DAILY 90 tablet 1   triamcinolone cream (KENALOG) 0.1 % Apply 1 application topically 2 (two) times daily as needed. 45 g 1   Magnesium 250 MG TABS Take 250 mg by mouth daily with lunch.     No current facility-administered medications on file prior to visit.    Review of Systems Constitutional: Negative for other unusual diaphoresis, sweats, appetite or weight changes HENT: Negative for other worsening hearing loss, ear pain, facial swelling, mouth sores or neck stiffness.   Eyes: Negative for other worsening pain, redness or other visual disturbance.  Respiratory: Negative for other stridor or swelling Cardiovascular: Negative for other palpitations or other chest pain  Gastrointestinal: Negative for worsening diarrhea or loose stools, blood in stool, distention or other pain Genitourinary: Negative for hematuria, flank pain or other change in urine volume.  Musculoskeletal: Negative for myalgias or other joint swelling.  Skin: Negative for other color change, or other wound or worsening drainage.  Neurological: Negative for other syncope or numbness. Hematological: Negative for other adenopathy or swelling Psychiatric/Behavioral: Negative for hallucinations, other worsening agitation, SI, self-injury, or new decreased concentration All other system neg per pt    Objective:   Physical Exam BP 108/78 (BP Location: Left Arm, Patient Position: Sitting, Cuff Size: Large)    Pulse 74    Temp 98.1 F (36.7 C) (Oral)    Ht 5\' 2"  (1.575 m)    Wt 189 lb  (85.7 kg)    SpO2 97%    BMI 34.57 kg/m  VS noted,  Constitutional: Pt is oriented to person, place, and time. Appears well-developed and well-nourished, in no significant distress and comfortable Head: Normocephalic and atraumatic  Eyes: Conjunctivae and EOM are normal. Pupils are equal, round, and reactive to light Right Ear: External ear normal without discharge Left Ear: External ear normal without discharge Nose: Nose without discharge or deformity Mouth/Throat: Oropharynx is without other ulcerations and moist  Neck: Normal range of motion. Neck supple. No JVD present. No tracheal deviation present or significant neck LA or mass Cardiovascular: Normal rate, regular rhythm, normal heart sounds and intact distal pulses.   Pulmonary/Chest: WOB normal and breath sounds without rales or wheezing  Abdominal: Soft. Bowel sounds are normal. NT. No HSM  Musculoskeletal: Normal range of  motion. Exhibits no edema Lymphadenopathy: Has no other cervical adenopathy.  Neurological: Pt is alert and oriented to person, place, and time. Pt has normal reflexes. No cranial nerve deficit. Motor grossly intact, Gait intact Skin: Skin is warm and dry. No rash noted or new ulcerations Psychiatric:  Has normal mood and affect. Behavior is normal without agitation No other exam findings Lab Results  Component Value Date   WBC 5.9 05/30/2018   HGB 12.4 05/30/2018   HCT 36.7 05/30/2018   PLT 210.0 05/30/2018   GLUCOSE 87 05/30/2018   CHOL 92 05/30/2018   TRIG 49.0 05/30/2018   HDL 59.40 05/30/2018   LDLCALC 23 05/30/2018   ALT 9 05/30/2018   AST 13 05/30/2018   NA 137 05/30/2018   K 3.8 05/30/2018   CL 101 05/30/2018   CREATININE 0.98 05/30/2018   BUN 13 05/30/2018   CO2 30 05/30/2018   TSH 1.59 05/30/2018   INR 1.02 05/25/2015   HGBA1C 5.8 05/30/2018       Assessment & Plan:

## 2019-06-03 NOTE — Assessment & Plan Note (Signed)
For f/u lab 

## 2019-06-04 LAB — HEMOGLOBIN A1C: Hgb A1c MFr Bld: 5.9 % (ref 4.6–6.5)

## 2019-06-11 ENCOUNTER — Other Ambulatory Visit (INDEPENDENT_AMBULATORY_CARE_PROVIDER_SITE_OTHER): Payer: PPO

## 2019-06-11 DIAGNOSIS — Z0000 Encounter for general adult medical examination without abnormal findings: Secondary | ICD-10-CM

## 2019-06-12 LAB — URINALYSIS, ROUTINE W REFLEX MICROSCOPIC
Bilirubin Urine: NEGATIVE
Hgb urine dipstick: NEGATIVE
Ketones, ur: NEGATIVE
Leukocytes,Ua: NEGATIVE
Nitrite: NEGATIVE
RBC / HPF: NONE SEEN (ref 0–?)
Specific Gravity, Urine: 1.02 (ref 1.000–1.030)
Total Protein, Urine: NEGATIVE
Urine Glucose: NEGATIVE
Urobilinogen, UA: 0.2 (ref 0.0–1.0)
WBC, UA: NONE SEEN (ref 0–?)
pH: 6 (ref 5.0–8.0)

## 2019-06-17 ENCOUNTER — Telehealth: Payer: Self-pay | Admitting: Internal Medicine

## 2019-06-17 MED ORDER — LIVALO 4 MG PO TABS: 1.0000 | ORAL_TABLET | Freq: Every day | ORAL | 1 refills | Status: DC

## 2019-06-17 NOTE — Telephone Encounter (Signed)
LIVALO 4 MG TABS   Send to [employer]

## 2019-06-19 ENCOUNTER — Ambulatory Visit: Payer: PPO | Admitting: *Deleted

## 2019-06-19 ENCOUNTER — Other Ambulatory Visit: Payer: Self-pay

## 2019-06-19 VITALS — Ht 62.0 in | Wt 185.0 lb

## 2019-06-19 DIAGNOSIS — Z86010 Personal history of colonic polyps: Secondary | ICD-10-CM

## 2019-06-19 DIAGNOSIS — Z800 Family history of malignant neoplasm of digestive organs: Secondary | ICD-10-CM

## 2019-06-19 NOTE — Progress Notes (Signed)
Patient denies any allergies to egg or soy products. Patient denies complications with anesthesia/sedation.  Patient denies oxygen use at home and denies diet medications.   Pt verified name, DOB, address and insurance during PV today. Pt mailed instruction packet to included paper to complete and mail back to [facility] with addressed and stamped envelope, Emmi video, copy of consent form to read and not return, and instructions.  PV completed over the phone. Pt encouraged to call with questions or Concerns.  Patient wears contact lens, no glasses.  Will need to take them out before procedure.

## 2019-06-30 ENCOUNTER — Encounter: Payer: Self-pay | Admitting: General Surgery

## 2019-06-30 NOTE — Progress Notes (Signed)
Patient came to clinic to drop off consent form for her procedure with Dr Havery Moros. Stated she felt congested and wanted a tempurature and O2 sat taken. Patient believes she has a cold and is taking mucinex

## 2019-07-01 ENCOUNTER — Telehealth: Payer: Self-pay | Admitting: Gastroenterology

## 2019-07-01 NOTE — Telephone Encounter (Signed)

## 2019-07-02 ENCOUNTER — Ambulatory Visit (AMBULATORY_SURGERY_CENTER): Payer: PPO | Admitting: Gastroenterology

## 2019-07-02 ENCOUNTER — Encounter: Payer: Self-pay | Admitting: Gastroenterology

## 2019-07-02 ENCOUNTER — Other Ambulatory Visit: Payer: Self-pay

## 2019-07-02 VITALS — BP 114/72 | HR 83 | Temp 98.4°F | Resp 17 | Ht 62.0 in | Wt 189.0 lb

## 2019-07-02 DIAGNOSIS — D123 Benign neoplasm of transverse colon: Secondary | ICD-10-CM | POA: Diagnosis not present

## 2019-07-02 DIAGNOSIS — Z800 Family history of malignant neoplasm of digestive organs: Secondary | ICD-10-CM

## 2019-07-02 DIAGNOSIS — D120 Benign neoplasm of cecum: Secondary | ICD-10-CM

## 2019-07-02 DIAGNOSIS — I10 Essential (primary) hypertension: Secondary | ICD-10-CM | POA: Diagnosis not present

## 2019-07-02 DIAGNOSIS — Z1211 Encounter for screening for malignant neoplasm of colon: Secondary | ICD-10-CM | POA: Diagnosis not present

## 2019-07-02 DIAGNOSIS — Z86010 Personal history of colonic polyps: Secondary | ICD-10-CM | POA: Diagnosis not present

## 2019-07-02 DIAGNOSIS — J45909 Unspecified asthma, uncomplicated: Secondary | ICD-10-CM | POA: Diagnosis not present

## 2019-07-02 MED ORDER — SODIUM CHLORIDE 0.9 % IV SOLN: 500.0000 mL | Freq: Once | INTRAVENOUS | Status: DC

## 2019-07-02 NOTE — Progress Notes (Signed)
Pt's states no medical or surgical changes since previsit or office visit.  June Bullock took temp and Courtney Washington took vitals. 

## 2019-07-02 NOTE — Op Note (Signed)
[name] Patient Name: Andrea Mason Procedure Date: 07/02/2019 12:49 PM MRN: [id number] Endoscopist: Remo Lipps P. Havery Moros , MD Age: 68 Referring MD:  Date of Birth: 02-Dec-1951 Gender: Female Account #: [id number] Procedure:                Colonoscopy Indications:              Screening in patient at increased risk: Family                            history of 1st-degree relative with colorectal                            cancer (brother diagnosed around age 21) Medicines:                Monitored Anesthesia Care Procedure:                Pre-Anesthesia Assessment:                           - Prior to the procedure, a History and Physical                            was performed, and patient medications and                            allergies were reviewed. The patient's tolerance of                            previous anesthesia was also reviewed. The risks                            and benefits of the procedure and the sedation                            options and risks were discussed with the patient.                            All questions were answered, and informed consent                            was obtained. Prior Anticoagulants: The patient has                            taken no previous anticoagulant or antiplatelet                            agents. ASA Grade Assessment: III - A patient with                            severe systemic disease. After reviewing the risks                            and benefits, the patient was deemed in  satisfactory condition to undergo the procedure.                           After obtaining informed consent, the colonoscope                            was passed under direct vision. Throughout the                            procedure, the patient's blood pressure, pulse, and                            oxygen saturations were monitored continuously. The                            Colonoscope was  introduced through the anus and                            advanced to the the cecum, identified by                            appendiceal orifice and ileocecal valve. The                            colonoscopy was performed without difficulty. The                            patient tolerated the procedure well. The quality                            of the bowel preparation was adequate. The                            ileocecal valve, appendiceal orifice, and rectum                            were photographed. Scope In: 1:02:12 PM Scope Out: 1:29:13 PM Scope Withdrawal Time: 0 hours 23 minutes 31 seconds  Total Procedure Duration: 0 hours 27 minutes 1 second  Findings:                 The perianal and digital rectal examinations were                            normal.                           A 3 mm polyp was found in the cecum. The polyp was                            flat. The polyp was removed with a cold snare.                            Resection and retrieval were complete.  Three sessile polyps were found in the transverse                            colon. The polyps were 2 to 6 mm in size. These                            polyps were removed with a cold snare. Resection                            and retrieval were complete.                           The colon was tortuous.                           The exam was otherwise without abnormality. Complications:            No immediate complications. Estimated blood loss:                            Minimal. Estimated Blood Loss:     Estimated blood loss was minimal. Impression:               - One 3 mm polyp in the cecum, removed with a cold                            snare. Resected and retrieved.                           - Three 2 to 6 mm polyps in the transverse colon,                            removed with a cold snare. Resected and retrieved.                           - Tortuous colon.                            - The examination was otherwise normal. Recommendation:           - Patient has a contact number available for                            emergencies. The signs and symptoms of potential                            delayed complications were discussed with the                            patient. Return to normal activities tomorrow.                            Written discharge instructions were provided to the  patient.                           - Resume previous diet.                           - Continue present medications.                           - Await pathology results. Remo Lipps P. Natalie Mceuen, MD 07/02/2019 1:32:41 PM This report has been signed electronically.

## 2019-07-02 NOTE — Patient Instructions (Signed)
Please read handouts provided. Await pathology results. Continue present medications.      YOU HAD AN ENDOSCOPIC PROCEDURE TODAY AT THE [facility]:   Refer to the procedure report that was given to you for any specific questions about what was found during the examination.  If the procedure report does not answer your questions, please call your gastroenterologist to clarify.  If you requested that your care partner not be given the details of your procedure findings, then the procedure report has been included in a sealed envelope for you to review at your convenience later.  YOU SHOULD EXPECT: Some feelings of bloating in the abdomen. Passage of more gas than usual.  Walking can help get rid of the air that was put into your GI tract during the procedure and reduce the bloating. If you had a lower endoscopy (such as a colonoscopy or flexible sigmoidoscopy) you may notice spotting of blood in your stool or on the toilet paper. If you underwent a bowel prep for your procedure, you may not have a normal bowel movement for a few days.  Please Note:  You might notice some irritation and congestion in your nose or some drainage.  This is from the oxygen used during your procedure.  There is no need for concern and it should clear up in a day or so.  SYMPTOMS TO REPORT IMMEDIATELY:   Following lower endoscopy (colonoscopy or flexible sigmoidoscopy):  Excessive amounts of blood in the stool  Significant tenderness or worsening of abdominal pains  Swelling of the abdomen that is new, acute  Fever of 100F or higher    For urgent or emergent issues, a gastroenterologist can be reached at any hour by calling [phone].   DIET:  We do recommend a small meal at first, but then you may proceed to your regular diet.  Drink plenty of fluids but you should avoid alcoholic beverages for 24 hours.  ACTIVITY:  You should plan to take it easy for the rest of today and you should NOT  DRIVE or use heavy machinery until tomorrow (because of the sedation medicines used during the test).    FOLLOW UP: Our staff will call the number listed on your records 48-72 hours following your procedure to check on you and address any questions or concerns that you may have regarding the information given to you following your procedure. If we do not reach you, we will leave a message.  We will attempt to reach you two times.  During this call, we will ask if you have developed any symptoms of COVID 19. If you develop any symptoms (ie: fever, flu-like symptoms, shortness of breath, cough etc.) before then, please call [phone].  If you test positive for Covid 19 in the 2 weeks post procedure, please call and report this information to us.    If any biopsies were taken you will be contacted by phone or by letter within the next 1-3 weeks.  Please call us at [phone] if you have not heard about the biopsies in 3 weeks.    SIGNATURES/CONFIDENTIALITY: You and/or your care partner have signed paperwork which will be entered into your electronic medical record.  These signatures attest to the fact that that the information above on your After Visit Summary has been reviewed and is understood.  Full responsibility of the confidentiality of this discharge information lies with you and/or your care-partner. 

## 2019-07-02 NOTE — Progress Notes (Signed)
Called to room to assist during endoscopic procedure.  Patient ID and intended procedure confirmed with present staff. Received instructions for my participation in the procedure from the performing physician.  

## 2019-07-04 ENCOUNTER — Telehealth: Payer: Self-pay

## 2019-07-04 NOTE — Telephone Encounter (Signed)
  Follow up Call-  Call back number 07/02/2019  Post procedure Call Back phone  # [phone]  Permission to leave phone message Yes  Some recent data might be hidden     Patient questions:  Do you have a fever, pain , or abdominal swelling? No. Pain Score  0 *  Have you tolerated food without any problems? Yes.    Have you been able to return to your normal activities? Yes.    Do you have any questions about your discharge instructions: Diet   No. Medications  No. Follow up visit  No.  Do you have questions or concerns about your Care? No.  Actions: * If pain score is 4 or above: No action needed, pain <4.  Pt did mention she has not had a BM since her procedure. I asked if she felt constipated, pt replied no.  She is not having any abdominal pain and no abdominal swelling.  I advised her if she starts to have any sx to please call us back.  Pt said she would.    1. Have you developed a fever since your procedure? no  2.   Have you had an respiratory symptoms (SOB or cough) since your procedure? no  3.   Have you tested positive for COVID 19 since your procedure no  4.   Have you had any family members/close contacts diagnosed with the COVID 19 since your procedure?  no   If yes to any of these questions please route to Joylene John, RN and Alphonsa Gin, Therapist, sports.

## 2019-07-04 NOTE — Telephone Encounter (Signed)
Left message for pt to call if any questions or problems after her colonoscopy, otherwise we will call early this afternoon to check up on her.

## 2019-07-14 ENCOUNTER — Telehealth: Payer: Self-pay | Admitting: Internal Medicine

## 2019-07-14 NOTE — Telephone Encounter (Signed)
Relation to pt: self  Call back number: [phone]  Pharmacy:  [facility], [place] [phone] (Phone) [phone] (Fax)      Reason for call:  Patient would like PCP to recommend indigestion medication, Armbruster, Carlota Raspberry, MD recommended patient taken medication 2x daily 1 in the morning and 1 in the evening, patient states pantoprazole may be causing her to urinate often at night and would like to know PCP thoughts. If PCP agrees with specialist patient requesting a new rx reflecting 2x sent to pharmacy, please advise

## 2019-07-14 NOTE — Telephone Encounter (Signed)
No, this sounds fine and is pretty common for other patients as well for uncontrolled symptoms, so ok to take protonix 40 mg bid

## 2019-07-24 ENCOUNTER — Other Ambulatory Visit: Payer: Self-pay | Admitting: Internal Medicine

## 2019-07-24 MED ORDER — PANTOPRAZOLE SODIUM 40 MG PO TBEC: 40.0000 mg | DELAYED_RELEASE_TABLET | Freq: Every day | ORAL | 1 refills | Status: DC

## 2019-07-24 MED ORDER — FESOTERODINE FUMARATE ER 4 MG PO TB24: 4.0000 mg | ORAL_TABLET | Freq: Every day | ORAL | 1 refills | Status: DC

## 2019-07-24 NOTE — Addendum Note (Signed)
Addended by: Pollyann Glen on: 07/24/2019 03:25 PM   Modules accepted: Orders

## 2019-07-24 NOTE — Telephone Encounter (Signed)
Copied from [facility] [id number]. Topic: Quick Communication - Rx Refill/Question >> Jul 24, 2019  1:38 PM Mcneil, Ja-Kwan wrote: Medication: pantoprazole (PROTONIX) 40 MG tablet and TOVIAZ 4 MG TB24 tablet  Has the patient contacted their pharmacy? yes   Preferred Pharmacy (with phone number or street name): [id number], [place] [phone] (Phone)  [phone] (Fax)  Agent: Please be advised that RX refills may take up to 3 business days. We ask that you follow-up with your pharmacy.

## 2019-07-24 NOTE — Telephone Encounter (Signed)
See request from previous note.Attempted to reorder Toviaz 4mg  TB24 but message received that the medication was not the pt's preferred formulary for insurance.

## 2019-07-24 NOTE — Telephone Encounter (Signed)
Andrea Mason, protonix was already sent to pharmacy earlier today

## 2019-07-30 ENCOUNTER — Telehealth: Payer: Self-pay | Admitting: Internal Medicine

## 2019-07-30 NOTE — Telephone Encounter (Signed)
Patient has dropped off [place] Patient Assistance Program forms to be completed and sent off.  Placing in Dr. Gwynn Burly box.

## 2019-07-30 NOTE — Telephone Encounter (Signed)
Noted  

## 2019-07-31 NOTE — Telephone Encounter (Signed)
Forms have been completed and faxed to [employer].

## 2019-08-26 ENCOUNTER — Other Ambulatory Visit: Payer: Self-pay | Admitting: Internal Medicine

## 2019-08-26 DIAGNOSIS — J45991 Cough variant asthma: Secondary | ICD-10-CM

## 2019-08-26 MED ORDER — MONTELUKAST SODIUM 10 MG PO TABS: 10.0000 mg | ORAL_TABLET | Freq: Every day | ORAL | 0 refills | Status: DC

## 2019-09-23 DIAGNOSIS — H5211 Myopia, right eye: Secondary | ICD-10-CM | POA: Diagnosis not present

## 2019-11-12 ENCOUNTER — Ambulatory Visit: Payer: PPO | Admitting: Internal Medicine

## 2019-11-13 ENCOUNTER — Encounter: Payer: Self-pay | Admitting: Internal Medicine

## 2019-11-13 ENCOUNTER — Ambulatory Visit (INDEPENDENT_AMBULATORY_CARE_PROVIDER_SITE_OTHER): Payer: PPO | Admitting: Internal Medicine

## 2019-11-13 VITALS — BP 124/84 | HR 77 | Temp 97.9°F | Ht 62.0 in | Wt 191.0 lb

## 2019-11-13 DIAGNOSIS — M545 Low back pain, unspecified: Secondary | ICD-10-CM

## 2019-11-13 DIAGNOSIS — R351 Nocturia: Secondary | ICD-10-CM

## 2019-11-13 DIAGNOSIS — R229 Localized swelling, mass and lump, unspecified: Secondary | ICD-10-CM

## 2019-11-13 DIAGNOSIS — R739 Hyperglycemia, unspecified: Secondary | ICD-10-CM

## 2019-11-13 DIAGNOSIS — D170 Benign lipomatous neoplasm of skin and subcutaneous tissue of head, face and neck: Secondary | ICD-10-CM | POA: Diagnosis not present

## 2019-11-13 DIAGNOSIS — I10 Essential (primary) hypertension: Secondary | ICD-10-CM | POA: Diagnosis not present

## 2019-11-13 MED ORDER — MELOXICAM 15 MG PO TABS: 15.0000 mg | ORAL_TABLET | Freq: Every day | ORAL | 1 refills | Status: DC

## 2019-11-13 MED ORDER — FESOTERODINE FUMARATE ER 4 MG PO TB24: 4.0000 mg | ORAL_TABLET | Freq: Every day | ORAL | 3 refills | Status: DC

## 2019-11-13 NOTE — Patient Instructions (Addendum)
Please take all new medication as prescribed - the mobic anti-inflammatory for pain as needed  Please continue all other medications as before, including the toviaz.  Please have the pharmacy call with any other refills you may need.  Please continue your efforts at being more active, low cholesterol diet, and weight control.  Please keep your appointments with your specialists as you may have planned  You will be contacted regarding the referral for: urology  Please return in 6 months, or sooner if needed
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
## 2019-11-16 ENCOUNTER — Encounter: Payer: Self-pay | Admitting: Internal Medicine

## 2019-11-16 DIAGNOSIS — R229 Localized swelling, mass and lump, unspecified: Secondary | ICD-10-CM | POA: Insufficient documentation

## 2019-11-16 DIAGNOSIS — M545 Low back pain, unspecified: Secondary | ICD-10-CM | POA: Insufficient documentation

## 2019-11-16 DIAGNOSIS — R351 Nocturia: Secondary | ICD-10-CM | POA: Insufficient documentation

## 2019-11-16 NOTE — Assessment & Plan Note (Signed)
C/w msk strain after housework, for mobic 15 qd prn,  to f/u any worsening symptoms or concerns

## 2019-11-16 NOTE — Assessment & Plan Note (Signed)
lsightly increased in size per pt, declines general surgury evaluation

## 2019-11-16 NOTE — Assessment & Plan Note (Signed)
Etiology unclear, for urology referral and refill Lisbeth Ply

## 2019-11-16 NOTE — Assessment & Plan Note (Signed)
stable overall by history and exam, recent data reviewed with pt, and pt to continue medical treatment as before,  to f/u any worsening symptoms or concerns  

## 2019-11-16 NOTE — Assessment & Plan Note (Addendum)
Left temple benign appearing, pt reassured,  to f/u any worsening symptoms or concerns  Note:  Total time for pt hx, exam, review of record with pt in the room, determination of diagnoses and plan for further eval and tx is > 40 min, with over 50% spent in coordination and counseling of patient including the differential dx, tx, further evaluation and other management of skin nodule, nocturia, low back pain, lipoma, hyperglycemia, HTN

## 2019-11-18 ENCOUNTER — Telehealth: Payer: Self-pay | Admitting: Internal Medicine

## 2019-11-18 NOTE — Telephone Encounter (Signed)
Pt called back again and stated she has not gotten a call back and she is not feeling good but is concerned about this med .   She would like a call back asap [phone]

## 2019-11-18 NOTE — Telephone Encounter (Signed)
Patient is calling because the medication meloxicam (MOBIC) 15 MG tablet that was given to her she is scare to take due to the side affects that is has of stroke. She said she is in a lot on pain and would like to talk to Dr. Jenny Reichmann or his CMA about this. Please call patient back, thanks.

## 2019-11-18 NOTE — Telephone Encounter (Signed)
Fortunately, mobic is an anti-inflammatory that does not cause stroke in real life; this medication is similar to advil and alleve, and are normally not stopped out of any concern by the doctors for causing a stroke, or if a patient has had a stroke.  This medication is Ok to take.  I have nothing else appropriate to give her otherwise except to try tylenol 650 mg every 8 hrs if needed

## 2019-11-18 NOTE — Telephone Encounter (Signed)
Pt calling back to check status. Pt would like a call back as soon as possible. Pt states that she is having a hard time getting up and going to the bathroom.  Please advise
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
## 2019-11-19 NOTE — Telephone Encounter (Signed)
Pt has been informed and expressed understanding.  

## 2019-11-19 NOTE — Telephone Encounter (Signed)
Patient called back for Andrea Mason can be reached at Ph#  [phone]

## 2019-11-24 ENCOUNTER — Other Ambulatory Visit: Payer: Self-pay

## 2019-11-24 DIAGNOSIS — J45991 Cough variant asthma: Secondary | ICD-10-CM

## 2019-11-24 MED ORDER — MONTELUKAST SODIUM 10 MG PO TABS: 10.0000 mg | ORAL_TABLET | Freq: Every day | ORAL | 0 refills | Status: DC

## 2019-11-25 ENCOUNTER — Telehealth: Payer: Self-pay | Admitting: Internal Medicine

## 2019-11-25 NOTE — Telephone Encounter (Signed)
We do not have any samples of Dulera and are unsure if we are going to be receiving any more. Attempted to call pt but line went straight to VM. Left pt a detailed message in regards to this and stated to her in the VM if she wants to further discuss her inhalers/meds with MW that she needs to call to schedule an appt. Nothing further needed.

## 2019-11-26 ENCOUNTER — Other Ambulatory Visit: Payer: Self-pay

## 2019-11-26 ENCOUNTER — Ambulatory Visit (INDEPENDENT_AMBULATORY_CARE_PROVIDER_SITE_OTHER): Payer: PPO

## 2019-11-26 ENCOUNTER — Ambulatory Visit (INDEPENDENT_AMBULATORY_CARE_PROVIDER_SITE_OTHER): Payer: PPO | Admitting: Podiatry

## 2019-11-26 VITALS — BP 120/82 | HR 72 | Temp 98.6°F

## 2019-11-26 DIAGNOSIS — M2041 Other hammer toe(s) (acquired), right foot: Secondary | ICD-10-CM

## 2019-11-26 DIAGNOSIS — M2042 Other hammer toe(s) (acquired), left foot: Secondary | ICD-10-CM

## 2019-11-26 DIAGNOSIS — M65071 Abscess of tendon sheath, right ankle and foot: Secondary | ICD-10-CM

## 2019-11-26 DIAGNOSIS — M659 Synovitis and tenosynovitis, unspecified: Secondary | ICD-10-CM

## 2019-11-27 ENCOUNTER — Other Ambulatory Visit: Payer: Self-pay | Admitting: Internal Medicine

## 2019-11-27 DIAGNOSIS — J45991 Cough variant asthma: Secondary | ICD-10-CM

## 2019-12-03 ENCOUNTER — Telehealth: Payer: Self-pay | Admitting: Internal Medicine

## 2019-12-03 MED ORDER — BREO ELLIPTA 100-25 MCG/INH IN AEPB: 1.0000 | INHALATION_SPRAY | Freq: Every day | RESPIRATORY_TRACT | 0 refills | Status: DC

## 2019-12-03 NOTE — Telephone Encounter (Signed)
Breo [id number] one click each am x 2 week sample

## 2019-12-03 NOTE — Telephone Encounter (Signed)
Called and spoke with pt letting her know that MW wanted [place] to provide her with samples of Breo until she was able to get all taken care of with patient assistance for dulera and pt verbalized understanding.   Stated to pt that she was due for an appt and she verbalized understanding. appt scheduled for pt with MW 1/5 at 2:45 and samples placed up front for pt. Nothing further needed.

## 2019-12-03 NOTE — Telephone Encounter (Signed)
See other encounter from 12/03/19.

## 2019-12-03 NOTE — Progress Notes (Signed)
   Subjective:  68 y.o. female presenting today as a new patient with a chief complaint of right ankle pain that has been ongoing for the past several months. Walking and being on the feet makes the pain worse. She has been taking Meloxicam for treatment. She reports h/o bilateral bunionectomies in 1998 by Dr. Amalia Hailey. Patient is here for further evaluation and treatment.    Past Medical History:  Diagnosis Date  . Allergic rhinitis   . Allergy   . Asthma   . Depression   . DJD (degenerative joint disease), cervical   . GERD (gastroesophageal reflux disease)   . History of colonic polyps   . Hypertension   . Obesity   . 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 Stroke ([place])   . Syncope      Objective / Physical Exam:  General:  The patient is alert and oriented x3 in no acute distress. Dermatology:  Skin is warm, dry and supple bilateral lower extremities. Negative for open lesions or macerations. Vascular:  Palpable pedal pulses bilaterally. No edema or erythema noted. Capillary refill within normal limits. Neurological:  Epicritic and protective threshold grossly intact bilaterally.  Musculoskeletal Exam:  Pain on palpation to the anterior lateral medial aspects of the patient's right ankle. Mild edema noted. Range of motion within normal limits to all pedal and ankle joints bilateral. Muscle strength 5/5 in all groups bilateral.   Radiographic Exam:  Fixation pins noted to the 1st metatarsal bilaterally. Normal osseous mineralization. Joint spaces preserved. No fracture/dislocation/boney destruction.    Assessment: 1. H/o bilateral bunion surgery 2. Lumbar radiculopathy  3. Right ankle synovitis    Plan of Care:  1. Patient was evaluated. X-Rays reviewed.  2. Injection of 0.5 mL Celestone Soluspan injected in the patient's right ankle. 3. Continue taking Meloxicam.  4. OTC Powerstep insoles provided to patient.  5. Return to clinic as needed.    Edrick Kins, DPM [facility]  Dr. Edrick Kins, [address]                                        [place], [place] [address]                Office [phone]  Fax [phone]

## 2019-12-03 NOTE — Telephone Encounter (Signed)
Spoke with patient. She stated that she dropped off patient assistance forms for [facility] and would like to have the forms faxed to [employer]. Advised her that [employer] does not accept faxed forms and that the form will have to be mailed. She verbalized understanding.   She also wanted to know what to do in the meantime that she is waiting to approved for the assistance program. She is completely out of her [facility]. Advised her that I could ask MW if we could provide her with a sample of a medication that is similar to [facility], she verbalized understanding.   After I got off the phone with patient, I realized she needs an appointment since she has not been seen since July 2019.   MW, please advise if you are ok with signing the forms and if we can provide her with a sample of a similar inhaler. Thanks!

## 2019-12-09 ENCOUNTER — Ambulatory Visit: Payer: PPO | Admitting: Internal Medicine

## 2019-12-09 ENCOUNTER — Other Ambulatory Visit: Payer: Self-pay

## 2019-12-10 ENCOUNTER — Other Ambulatory Visit: Payer: Self-pay

## 2019-12-10 ENCOUNTER — Encounter: Payer: Self-pay | Admitting: Internal Medicine

## 2019-12-10 ENCOUNTER — Ambulatory Visit (INDEPENDENT_AMBULATORY_CARE_PROVIDER_SITE_OTHER): Payer: PPO | Admitting: Internal Medicine

## 2019-12-10 DIAGNOSIS — R058 Other specified cough: Secondary | ICD-10-CM

## 2019-12-10 DIAGNOSIS — Z20822 Contact with and (suspected) exposure to covid-19: Secondary | ICD-10-CM

## 2019-12-10 DIAGNOSIS — J45991 Cough variant asthma: Secondary | ICD-10-CM | POA: Diagnosis not present

## 2019-12-10 DIAGNOSIS — R05 Cough: Secondary | ICD-10-CM | POA: Diagnosis not present

## 2019-12-10 NOTE — Progress Notes (Signed)
Subjective:   Patient ID: Andrea Mason, female    DOB: Dec 20, 1950   MRN: [id number]  Brief patient profile:  83  yobf never smoker followed by Dr Loretha Brasil for allergic rhinitis and asthma vs UACS     History of Present Illness  02/05/2013 1st pulmonary eval in EPIC era baseline = completely better on qvar 80 2bid and only use rescue once a week at baseline then much worse starting 02/03/13 with cough/ congestion/ green mucus and rescue x one by time of ov at 2pm.  No resting sob, very hoarse with harsh barking cough >Augmentin rx      12/18/2017  f/u ov/Tirsa Gail re:  Asthma/ chronic rhinitis / doing better following med calendar  Chief Complaint  Patient presents with  . Follow-up    slight cough, red colored mucus when blowing her nose, feels like z pac and prednisone helped after her last visit   very rare saba need  Not limited by breathing from desired activities   Sleeping fine  rec 3 month f/u [name] P re med calendar/ reconciliation > did not do    06/13/2018 acute extended ov/Sarvesh Meddaugh re: cough x one week > dark mucus with nasal congestion/ acute onset  Chief Complaint  Patient presents with  . Acute Visit    coughing up black mucus,, sinus infection, nasal congestion   breathing is ok  Has calendar but not following contingency instructions re rx for cough with nasty sputum, has augmentin refillable thru 12/2018 rec Any time you note nasty augmentin > go ahead and take Augmentin 875 mg take one pill twice daily  X 10 days  For cough > mucinex dm 1200 mg every 12 hours and use the flutter valve as much as possible  Call Dr [name] for follow up   Virtual Visit via Telephone Note 12/10/2019   I connected with Andrea Mason on 12/10/19 at  10:30 AM EST by telephone and verified that I am speaking with the correct person using two identifiers.   I discussed the limitations, risks, security and privacy concerns of performing an evaluation and management service by telephone and  the availability of in person appointments. I also discussed with the patient that there may be a patient responsible charge related to this service. The patient expressed understanding and agreed to proceed.   History of Present Illness: Dyspnea:  Was doing gym until covid 19, still active but no aerobics  Cough: gone  Sleeping: off zantac x months  SABA use: none  02: none    No obvious day to day or daytime variability or assoc excess/ purulent sputum or mucus plugs or hemoptysis or cp or chest tightness, subjective wheeze or overt sinus or hb symptoms.    Also denies any obvious fluctuation of symptoms with weather or environmental changes or other aggravating or alleviating factors except as outlined above.   Meds reviewed/ med reconciliation completed         Observations/Objective: Sounds good, no conversational sob, good voice texture    Assessment and Plan: See problem list for active a/p's   Follow Up Instructions: See avs for instructions unique to this ov which includes revised/ updated med list     I discussed the assessment and treatment plan with the patient. The patient was provided an opportunity to ask questions and all were answered. The patient agreed with the plan and demonstrated an understanding of the instructions.   The patient was advised to call back or seek an  in-person evaluation if the symptoms worsen or if the condition fails to improve as anticipated.  I provided 25  minutes of non-face-to-face time during this encounter.   Christinia Gully, MD

## 2019-12-10 NOTE — Patient Instructions (Addendum)
If night time cough or reflux best medication = Pepcid 20 mg otc or get Dr Jenny Reichmann to write it.   I would take the vaccine when available.    If you are satisfied with your treatment plan,  let your doctor know and he/she can either refill your medications or you can return here when your prescription runs out.     If in any way you are not 100% satisfied,  please tell us.  If 100% better, tell your friends!  Pulmonary follow up is as needed

## 2019-12-10 NOTE — Assessment & Plan Note (Addendum)
Followed in pulmonary clinic by Nuriya Stuck/ Young > f/u prn as of 12/10/2019    - Spirometry 12/11/2014  Min abn mid flows only   - 06/13/2018  After extensive coaching inhaler device  effectiveness =    75% (short ti)  On low dose ICS (duilera 100 2bid)  goals of chronic asthma control met including optimal function and elimination of symptoms with minimal need for rescue therapy.  Contingencies discussed in full including contacting this office immediately if not controlling the symptoms using the rule of two's.     Pt informed of the seriousness of COVID 19 infection as a direct risk to lung health  and safey and to close contacts and should continue to wear a facemask in public and minimize exposure to public locations but especially avoid any area or activity where non-close contacts are not observing distancing or wearing an appropriate face mask.  I strongly recommended vaccine when offered.     >>> f/u pulmonary prn

## 2019-12-10 NOTE — Assessment & Plan Note (Signed)
Doing ok on just ppi ac but more gerd symptoms at hs since zantac hs taken off the marked and using tums instead  Rec  add bacl H2 @ hs otc or rx per PCP (Dr Jenny Reichmann writing for PPI q am so can do the pm rx as well with whatever he chooses but for now otc pepcid a good option)   Each maintenance medication was reviewed in detail including most importantly the difference between maintenance and as needed and under what circumstances the prns are to be used.  Please see AVS for specific  Instructions which are unique to this visit and I personally typed out  which were reviewed in detail over the phone with the patient and a copy provided via MyChart

## 2019-12-11 ENCOUNTER — Telehealth: Payer: Self-pay

## 2019-12-11 LAB — NOVEL CORONAVIRUS, NAA: SARS-CoV-2, NAA: NOT DETECTED

## 2019-12-11 NOTE — Telephone Encounter (Signed)
Copied from [place] [phone]. Topic: General - Other >> Dec 11, 2019 10:17 AM Rainey Pines A wrote: Patient would like to speak with nurse in regards to Pitavastatin Calcium (LIVALO) 4 MG TABS side effects. Please advise

## 2019-12-12 MED ORDER — LIVALO 4 MG PO TABS: 1.0000 | ORAL_TABLET | ORAL | 0 refills | Status: DC

## 2019-12-12 NOTE — Telephone Encounter (Signed)
I called pt- no answer. Left message for patient to call back.

## 2019-12-12 NOTE — Telephone Encounter (Signed)
Livalo rx sent. See meds.  Dr. Jenny Reichmann what can patient do for her leg pain and difficulty walking?

## 2019-12-12 NOTE — Telephone Encounter (Signed)
Not sure what to say, may need a repeat office visit if her ambulation is getting worse

## 2019-12-12 NOTE — Telephone Encounter (Signed)
Patient called back and she thinks Livalo is  contributing to her leg pain and trouble walking. She has used muscle rubs, liniments and other things with no relieve. She wants to know if she can stop Livalo or try something else in it's place. Please advise. If she must continue Livalo, she will need refills.

## 2019-12-12 NOTE — Telephone Encounter (Signed)
script for Livalo if Dr. Jenny Reichmann wants her to continue taking it.  Andrea Mason also wants provider to know she is still having pain and difficulty walking up and down the stairs at home.

## 2019-12-12 NOTE — Telephone Encounter (Signed)
How about ok to take every other day for now, as this can sometimes help

## 2019-12-15 NOTE — Telephone Encounter (Signed)
Pt called. Scheduled appt tomorrow 12/16/19 with Dr. Jenny Reichmann.

## 2019-12-15 NOTE — Telephone Encounter (Signed)
Left message for patient to call back. May need to schedule OV if still having leg pain and problems walking.

## 2019-12-16 ENCOUNTER — Other Ambulatory Visit: Payer: Self-pay

## 2019-12-16 ENCOUNTER — Encounter: Payer: Self-pay | Admitting: Internal Medicine

## 2019-12-16 ENCOUNTER — Ambulatory Visit (INDEPENDENT_AMBULATORY_CARE_PROVIDER_SITE_OTHER): Payer: PPO | Admitting: Internal Medicine

## 2019-12-16 VITALS — BP 124/76 | HR 60 | Temp 98.8°F | Ht 62.0 in | Wt 194.0 lb

## 2019-12-16 DIAGNOSIS — R739 Hyperglycemia, unspecified: Secondary | ICD-10-CM | POA: Diagnosis not present

## 2019-12-16 DIAGNOSIS — I10 Essential (primary) hypertension: Secondary | ICD-10-CM | POA: Diagnosis not present

## 2019-12-16 DIAGNOSIS — M79604 Pain in right leg: Secondary | ICD-10-CM | POA: Insufficient documentation

## 2019-12-16 DIAGNOSIS — N3281 Overactive bladder: Secondary | ICD-10-CM

## 2019-12-16 DIAGNOSIS — M79674 Pain in right toe(s): Secondary | ICD-10-CM | POA: Diagnosis not present

## 2019-12-16 DIAGNOSIS — E785 Hyperlipidemia, unspecified: Secondary | ICD-10-CM

## 2019-12-16 MED ORDER — FESOTERODINE FUMARATE ER 8 MG PO TB24: 8.0000 mg | ORAL_TABLET | Freq: Every day | ORAL | 3 refills | Status: DC

## 2019-12-16 MED ORDER — PREDNISONE 10 MG PO TABS: ORAL_TABLET | ORAL | 0 refills | Status: DC

## 2019-12-16 NOTE — Assessment & Plan Note (Addendum)
C/w tendonitis, for cane use, ok to use her vet linament that helps, predpac asd, and refer sports medicine, and f/u podiatry as planned for nail care  Note:  Total time for pt hx, exam, review of record with pt in the room, determination of diagnoses and plan for further eval and tx is > 40 min, with over 50% spent in coordination and counseling of patient including the differential dx, tx, further evaluation and other management of leg pain, HTN, hyperglycemia, HLD, OAB

## 2019-12-16 NOTE — Patient Instructions (Addendum)
Please take all new medication as prescribed - the prednisone  Please see Sports Medicine on the first floor  Consider walking with cane while your leg is hurting; and ok to use the  Vet Linament if needed Ok to increase the toviaz to 8 mg  Please take the statin every day  Please continue all other medications as before, and refills have been done if requested.  Please have the pharmacy call with any other refills you may need.  Please continue your efforts at being more active, low cholesterol diet, and weight control.  Please keep your appointments with your specialists as you may have planned - podiatry later this month

## 2019-12-16 NOTE — Assessment & Plan Note (Signed)
Ok for increased toviaz 8 mg

## 2019-12-16 NOTE — Assessment & Plan Note (Signed)
stable overall by history and exam, recent data reviewed with pt, and pt to continue medical treatment as before,  to f/u any worsening symptoms or concerns  

## 2019-12-16 NOTE — Progress Notes (Signed)
Subjective:    Patient ID: Andrea Mason, female    DOB: 1950/12/07, 69 y.o.   MRN: [id number]  HPI  Here with several wks onset RLE pain below the knee involving the pretibial and ankle and foot, sharp, intermittent, gradually worsening now mod to severe and can point to a dorsal second ray tendon where it seems to be localized, and makes her limp to walk; denies other leg and knee pain. Saw podiatry [facility] - plans to f/u soon about a great toe slght overlap on the second toe but does not think associated.  Has cut back on her statin b/c she thought might be related.  Denies urinary symptoms such as dysuria, urgency, flank pain, hematuria or n/v, fever, chills, but still has frequency and nocturia.   Pt denies chest pain, increased sob or doe, wheezing, orthopnea, PND, increased LE swelling, palpitations, dizziness or syncope. Past Medical History:  Diagnosis Date  . Allergic rhinitis   . Allergy   . Asthma   . Depression   . DJD (degenerative joint disease), cervical   . GERD (gastroesophageal reflux disease)   . History of colonic polyps   . Hypertension   . Obesity   . 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 Stroke ([place])   . Syncope    Past Surgical History:  Procedure Laterality Date  . COLONOSCOPY    . FOOT SURGERY  1998  . 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 LOOP RECORDER REMOVAL N/A 10/02/2017   Procedure: LOOP RECORDER REMOVAL;  Surgeon: [name], MD;  Location: [facility];  Service: Cardiovascular;  Laterality: N/A;  . NASAL POLYP SURGERY  07/2006   x 2 with Dr. [name]  . NASAL SINUS SURGERY  07/2006   Dr. [name]  . TUBAL LIGATION      reports that she has never smoked. She has never used smokeless tobacco. She reports current alcohol use. She reports that she does not use drugs. family history includes Arrhythmia in her brother; Cancer in her mother;  Colon cancer in her brother; Congenital heart disease in her brother; Diabetes in her sister; Heart attack in her brother; Lung disease in her son; Stroke in her brother. Allergies  Allergen Reactions  . Tramadol Other (See Comments)    Almost passed out  . Ivp Dye [Iodinated Diagnostic Agents] Nausea And Vomiting and Other (See Comments)    Reaction: hot flashes  . Promethazine-Codeine Nausea And Vomiting   Current Outpatient Medications on File Prior to Visit  Medication Sig Dispense Refill  . acetaminophen (TYLENOL) 325 MG tablet Per bottle as needed    . aspirin 325 MG tablet Take 325 mg by mouth daily.    . Biotin 10000 MCG TABS Take 10,000 mcg by mouth daily.    . cetirizine (ZYRTEC ALLERGY) 10 MG tablet Take 10 mg by mouth at bedtime.     . Cholecalciferol (VITAMIN D3) 2000 units TABS Take 2,000 Units by mouth daily.    Marland Kitchen dextromethorphan-guaiFENesin (MUCINEX DM) 30-600 MG 12hr tablet Take 1-2 tablets by mouth 2 (two) times daily as needed (for congestion/cough (WITH FLUTTER VALVE)).     . Magnesium 250 MG TABS Take 250 mg by mouth daily with lunch.    . meloxicam (MOBIC) 15 MG tablet Take 1 tablet (15 mg total) by mouth daily. 90 tablet 1  . mometasone-formoterol (DULERA) 100-5 MCG/ACT AERO Inhale 2 puffs  into the lungs 2 (two) times daily. 1 Inhaler 3  . montelukast (SINGULAIR) 10 MG tablet Take 1 tablet (10 mg total) by mouth at bedtime. 30 tablet 0  . oxymetazoline (AFRIN) 0.05 % nasal spray Place 2 sprays into both nostrils 2 (two) times daily as needed (nasal stuffiness and nasal pressure for 5 days).     . pantoprazole (PROTONIX) 40 MG tablet Take 1 tablet (40 mg total) by mouth daily. 90 tablet 1  . Pitavastatin Calcium (LIVALO) 4 MG TABS Take 1 tablet (4 mg total) by mouth every other day. 90 tablet 0  . sodium chloride (OCEAN) 0.65 % SOLN nasal spray Place 2 sprays into both nostrils as needed (dry nose).     Marland Kitchen spironolactone (ALDACTONE) 25 MG tablet Take 25 mg by mouth  daily.     Marland Kitchen triamcinolone (NASACORT) 55 MCG/ACT AERO nasal inhaler Place 2 sprays into the nose daily. (Patient not taking: Reported on 12/16/2019) 1 Inhaler 12  . triamcinolone cream (KENALOG) 0.1 % Apply 1 application topically 2 (two) times daily as needed. (Patient not taking: Reported on 12/16/2019) 45 g 1   No current facility-administered medications on file prior to visit.   Review of Systems  Constitutional: Negative for other unusual diaphoresis or sweats HENT: Negative for ear discharge or swelling Eyes: Negative for other worsening visual disturbances Respiratory: Negative for stridor or other swelling  Gastrointestinal: Negative for worsening distension or other blood Genitourinary: Negative for retention or other urinary change Musculoskeletal: Negative for other MSK pain or swelling Skin: Negative for color change or other new lesions Neurological: Negative for worsening tremors and other numbness  Psychiatric/Behavioral: Negative for worsening agitation or other fatigue All otherwise neg per pt     Objective:   Physical Exam BP 124/76   Pulse 60   Temp 98.8 F (37.1 C)   Ht 5\' 2"  (1.575 m)   Wt 194 lb (88 kg)   LMP  (LMP Unknown)   SpO2 94%   BMI 35.48 kg/m  VS noted,  Constitutional: Pt appears in NAD HENT: Head: NCAT.  Right Ear: External ear normal.  Left Ear: External ear normal.  Eyes: . Pupils are equal, round, and reactive to light. Conjunctivae and EOM are normal Nose: without d/c or deformity Neck: Neck supple. Gross normal ROM Cardiovascular: Normal rate and regular rhythm.   Pulmonary/Chest: Effort normal and breath sounds without rales or wheezing.  Abd:  Soft, NT, ND, + BS, no organomegaly Neurological: Pt is alert. At baseline orientation, motor grossly intact Skin: Skin is warm. No rashes, other new lesions, no LE edema Psychiatric: Pt behavior is normal without agitation  All otherwise neg per pt  Lab Results  Component Value Date   WBC  6.3 06/03/2019   HGB 12.1 06/03/2019   HCT 36.2 06/03/2019   PLT 201.0 06/03/2019   GLUCOSE 76 06/03/2019   CHOL 79 06/03/2019   TRIG 72.0 06/03/2019   HDL 55.60 06/03/2019   LDLCALC 9 06/03/2019   ALT 12 06/03/2019   AST 18 06/03/2019   NA 137 06/03/2019   K 3.9 06/03/2019   CL 104 06/03/2019   CREATININE 0.98 06/03/2019   BUN 13 06/03/2019   CO2 26 06/03/2019   TSH 1.05 06/03/2019   INR 1.02 05/25/2015   HGBA1C 5.9 06/03/2019      Assessment & Plan:

## 2019-12-16 NOTE — Assessment & Plan Note (Signed)
Encouraged pt to restart the statin once daily

## 2019-12-17 ENCOUNTER — Encounter: Payer: Self-pay | Admitting: Family Medicine

## 2019-12-17 ENCOUNTER — Ambulatory Visit (INDEPENDENT_AMBULATORY_CARE_PROVIDER_SITE_OTHER): Payer: PPO | Admitting: Family Medicine

## 2019-12-17 ENCOUNTER — Ambulatory Visit (INDEPENDENT_AMBULATORY_CARE_PROVIDER_SITE_OTHER): Payer: PPO

## 2019-12-17 VITALS — BP 114/80 | HR 80 | Ht 62.0 in | Wt 194.0 lb

## 2019-12-17 DIAGNOSIS — M79674 Pain in right toe(s): Secondary | ICD-10-CM

## 2019-12-17 DIAGNOSIS — M79604 Pain in right leg: Secondary | ICD-10-CM | POA: Diagnosis not present

## 2019-12-17 DIAGNOSIS — M2040 Other hammer toe(s) (acquired), unspecified foot: Secondary | ICD-10-CM

## 2019-12-17 DIAGNOSIS — L84 Corns and callosities: Secondary | ICD-10-CM

## 2019-12-17 DIAGNOSIS — M47817 Spondylosis without myelopathy or radiculopathy, lumbosacral region: Secondary | ICD-10-CM | POA: Diagnosis not present

## 2019-12-17 DIAGNOSIS — M79661 Pain in right lower leg: Secondary | ICD-10-CM | POA: Diagnosis not present

## 2019-12-17 NOTE — Patient Instructions (Signed)
Thank you for coming in today. Continue the topical medicine.  Consider adding over the counter voltaren gel. Attend PT.  Get xray on your way out.  Recheck with me in 1 month.  Return sooner if needed.   Use the toe spacer on your right foot.

## 2019-12-17 NOTE — Progress Notes (Signed)
Subjective:    I'm seeing this patient as a consultation for:  Dr. Jenny Mason. Note will be routed back to referring provider/PCP.  CC: R foot and lower leg pain  I, Andrea Mason, LAT, ATC, am serving as scribe for Dr. Lynne Mason.  HPI: Pt is a 69 y/o female presenting w/ c/o R anterior lower leg and dorsal foot pain x one month.  She reports that the pain is sharp and intermittent nature but has been gradually worsening.  She locates the most significant pain to her R dorsal 2nd ray.  Aggravating factors include walking/weight bearing activity.  She had B foot XRs on 11/26/19.  She saw her PCP yesterday who referred her to Sports Medicine and also prescribed a steroid dose pack.  She is also being seen by podiatry and has a f/u appt w/ them soon.  She has tried horse linament on her foot which she feels is helping.  She rates her pain at a 4-5/10 and describes it as aching/throbbing.  She states that when her foot pain is particularly bad, then she feels the pain going up her R LE to her lower back.  She also deals w/ cramps and spasms in her feet.  Past medical history, Surgical history, Family history, Social history, Allergies, and medications have been entered into the medical record, reviewed. History of stroke involving right side.  Review of Systems: No headache, visual changes, nausea, vomiting, diarrhea, constipation, dizziness, abdominal pain, skin rash, fevers, chills, night sweats, weight loss, swollen lymph nodes, body aches, joint swelling, muscle aches, chest pain, shortness of breath, mood changes, visual or auditory hallucinations.   Objective:    Vitals:   12/17/19 1458  BP: 114/80  Pulse: 80  SpO2: 96%   General: Well Developed, well nourished, and in no acute distress.  Neuro/Psych: Alert and oriented x3, extra-ocular muscles intact, able to move all 4 extremities, sensation grossly intact. Skin: Warm and dry, no rashes noted.  Respiratory: Not using accessory muscles,  speaking in full sentences, trachea midline.  Cardiovascular: Pulses palpable, no extremity edema. Abdomen: Does not appear distended. MSK:  L-spine: Normal-appearing Nontender. Normal motion. Lower extremity sensation is equal normal throughout. Lower extremity reflexes equal normal bilateral knees and ankles. Strength is intact bilateral lower extremities.  Right knee normal-appearing normal motion nontender normal strength.  Right lower leg normal-appearing nontender.  Normal sensation.  Right foot and ankle: No significant ankle effusion ankle is normal-appearing Foot: Mature scar dorsal aspect of first MTP with corn formation medial great toe IP joint.  Hammertoe deformity throughout with some crossover at first and second toe. Patient has difficulty coordinating foot dorsiflexion and eversion.  She tends to dorsiflex and strongly invert her foot and ankle when asked. She has difficulty dorsiflexing her second third fourth and fifth toes. First MTP motion significantly limited in flexion.  Normal extension. Pulses intact dorsal foot.  Lab and Radiology Results  X-ray images feet bilaterally obtained November 26, 2019 reviewed.  Intact surgical hardware from bunionectomy.  Moderate degenerative changes.  No acute fractures.  X-ray images lumbar spine and right tib-fib obtained today personally Edavally reviewed.  L-spine: DDD neuroforaminal stenosis and DJD most prominent at L5-S1.  No acute fractures.  Right tib-fib: No acute fractures no lytic lesions no severe degenerative changes.  Await formal radiology review  Impression and Recommendations:    Assessment and Plan: 69 y.o. female with  Right lower leg pain.  Pain is predominantly located at the lateral shin lower  leg.  Pain could be secondary to muscle weakness and fatigue foot dorsiflexors.  Additionally could be L5 radiculopathy.  X-rays obtained today show degenerative changes.  On exam patient does have some  difficulty coordinating foot dorsiflexion and eversion and certainly could have some weakness or muscle dysfunction to explain the pain she has in the anterior and lateral lower leg.  Plan to proceed with trial of physical therapy.  Continue oral prednisone prescribed by PCP and topical menthol containing liniment.  Additionally recommend trial of Voltaren gel.  Check back in 1 month.  PDMP not reviewed this encounter. Orders Placed This Encounter  Procedures  . DG Tibia/Fibula Right    Standing Status:   Future    Number of Occurrences:   1    Standing Expiration Date:   02/13/2021    Order Specific Question:   Reason for Exam (SYMPTOM  OR DIAGNOSIS REQUIRED)    Answer:   eval right leg pain    Order Specific Question:   Preferred imaging location?    Answer:   [facility]    Order Specific Question:   Radiology Contrast Protocol - do NOT remove file path    Answer:   \\[email or web address]\DXFluoroContrastProtocols.pdf  . DG Lumbar Spine Complete    Standing Status:   Future    Number of Occurrences:   1    Standing Expiration Date:   02/13/2021    Order Specific Question:   Reason for Exam (SYMPTOM  OR DIAGNOSIS REQUIRED)    Answer:   eval possible right L5 radiculopathy    Order Specific Question:   Preferred imaging location?    Answer:   [facility]    Order Specific Question:   Radiology Contrast Protocol - do NOT remove file path    Answer:   \\[email or web address]\Radiant\DXFluoroContrastProtocols.pdf  . Ambulatory referral to Physical Therapy    Referral Priority:   Routine    Referral Type:   Physical Medicine    Referral Reason:   Specialty Services Required    Requested Specialty:   Physical Therapy   No orders of the defined types were placed in this encounter.   Discussed warning signs or symptoms. Please see discharge instructions. Patient expresses understanding.   The above documentation has been reviewed and is accurate and complete Andrea Mason

## 2019-12-18 NOTE — Progress Notes (Signed)
X-ray lower leg looks pretty normal with some arthritis in the knee.

## 2019-12-18 NOTE — Progress Notes (Signed)
X-ray lumbar spine does show some arthritis at the low back.  A nerve pinched in this area could give you pain in your right lower leg.

## 2019-12-19 DIAGNOSIS — R350 Frequency of micturition: Secondary | ICD-10-CM | POA: Diagnosis not present

## 2019-12-19 DIAGNOSIS — R351 Nocturia: Secondary | ICD-10-CM | POA: Diagnosis not present

## 2019-12-22 ENCOUNTER — Other Ambulatory Visit: Payer: Self-pay

## 2019-12-22 DIAGNOSIS — J45991 Cough variant asthma: Secondary | ICD-10-CM

## 2019-12-22 MED ORDER — MONTELUKAST SODIUM 10 MG PO TABS: 10.0000 mg | ORAL_TABLET | Freq: Every day | ORAL | 4 refills | Status: DC

## 2019-12-24 ENCOUNTER — Encounter: Payer: Self-pay | Admitting: Physical Therapy

## 2019-12-24 ENCOUNTER — Other Ambulatory Visit: Payer: Self-pay

## 2019-12-24 ENCOUNTER — Ambulatory Visit: Payer: PPO | Attending: Family Medicine | Admitting: Physical Therapy

## 2019-12-24 DIAGNOSIS — M6281 Muscle weakness (generalized): Secondary | ICD-10-CM | POA: Diagnosis not present

## 2019-12-24 DIAGNOSIS — R262 Difficulty in walking, not elsewhere classified: Secondary | ICD-10-CM | POA: Diagnosis not present

## 2019-12-24 DIAGNOSIS — M79661 Pain in right lower leg: Secondary | ICD-10-CM | POA: Diagnosis not present

## 2019-12-24 NOTE — Patient Instructions (Signed)
Access Code: [email or web address]  URL: [email or web address]  Date: 12/24/2019  Prepared by: Jeral Pinch   Exercises Gastroc Stretch on Wall - 3 reps - 30 hold Seated Ankle Eversion with Resistance - 10 reps - 3 sets - 2-3 hold - 2x daily Standing Single Leg Stance with Counter Support - 10 reps - 2x daily

## 2019-12-24 NOTE — Therapy (Signed)
[facility] [address] [place], [place], [address] Phone: [phone]   Fax:  [phone]  Physical Therapy Evaluation  Patient Details  Name: Andrea Mason MRN: [id number] Date of Birth: Mar 01, 1951 Referring Provider (PT): Dr Lynne Leader   Encounter Date: 12/24/2019  PT End of Session - 12/24/19 0926    Visit Number  1    Number of Visits  8    Date for PT Re-Evaluation  01/21/20    Authorization Type  health team advantage    PT Start Time  0926    PT Stop Time  1006    PT Time Calculation (min)  40 min    Activity Tolerance  Patient tolerated treatment well    Behavior During Therapy  [facility] for tasks assessed/performed       Past Medical History:  Diagnosis Date  . Allergic rhinitis   . Allergy   . Asthma   . Depression   . DJD (degenerative joint disease), cervical   . GERD (gastroesophageal reflux disease)   . History of colonic polyps   . Hypertension   . Obesity   . 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 Stroke ([place])   . Syncope     Past Surgical History:  Procedure Laterality Date  . COLONOSCOPY    . FOOT SURGERY  1998  . 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 LOOP RECORDER REMOVAL N/A 10/02/2017   Procedure: LOOP RECORDER REMOVAL;  Surgeon: [name], MD;  Location: [facility];  Service: Cardiovascular;  Laterality: N/A;  . NASAL POLYP SURGERY  07/2006   x 2 with Dr. [name]  . NASAL SINUS SURGERY  07/2006   Dr. [name]  . TUBAL LIGATION      There were no vitals filed for this visit.   Subjective Assessment - 12/24/19 0927    Subjective  Pt reports Rt lateral lower leg pain that began about a month ago, using volteran gell and it seems to help, also on prednisone - has one more pill an dusing mobic.  Pt reports she was putting up her Christmas stuff and first had low back pain and  then it moved into her leg. Will see podiatry 12/31/2019    Pertinent History  bilat bunuinectomy, HTN, h/ CVA 2014,  DJD cervical, GERD    Patient Stated Goals  Get Rt big toe to lie down on the floor, walk with out pain    Currently in Pain?  No/denies   has the pain with walking - not so much pain but tightness        [facility] PT Assessment - 12/24/19 0001      Assessment   Medical Diagnosis  Rt lower leg and foot pain    Referring Provider (PT)  Dr Lynne Leader    Onset Date/Surgical Date  11/23/19    Hand Dominance  Right    Next MD Visit  01/20/2020    Prior Therapy  not for this - did have therapy after her strokes a long time ago      Precautions   Precautions  None      Balance Screen   Has the patient fallen in the past 6 months  --   pt doesn't remember - says probably   Has the patient had a decrease in activity level because of a fear of falling?  No    Is the patient reluctant to leave their home because of a fear of falling?   No      Home Environment   Living Environment  Private residence    [place] to enter   17 to second floor apartment     Prior Function   Level of [place]  Retired    Leisure  read, go to church and have fun with family      Observation/Other Assessments   Other Surveys   Lower Extremity Functional Scale    Lower Extremity Functional Scale   62/80, 77.5%       Functional Tests   Functional tests  Squat;Single leg stance      Squat   Comments  WNL      Single Leg Stance   Comments  Lt 4sec, Rt 4sec      Posture/Postural Control   Posture/Postural Control  Postural limitations    Postural Limitations  Increased lumbar lordosis   obesity     ROM / Strength   AROM / PROM / Strength  AROM;Strength      AROM   AROM Assessment Site  Hip;Knee;Ankle;Lumbar    Right/Left Hip  --   limited d/t soft tissue    Right/Left Knee  --   WNL   Right/Left Ankle  --    bilat DF 8 degrees   Lumbar Flexion  to the floor    Lumbar Extension  WNL    Lumbar - Right Rotation  WNL    Lumbar - Left Rotation  WNL      Strength   Strength Assessment Site  Hip;Knee;Ankle;Lumbar    Right/Left Hip  Right   Lt WNL   Right Hip Flexion  4-/5    Right Hip Extension  4/5    Right Hip External Rotation   4+/5    Right Hip ABduction  4+/5    Right/Left Knee  --   5/5   Right/Left Ankle  --   WNL except Rt ankle eversion 4-/5   Lumbar Extension  --   poor multifidi lumbar     Palpation   Spinal mobility  lumbar WNL     Palpation comment  no tenderness in muscles around lumbar region, tight and tender in Rt gastroc and peroneals       Special Tests   Other special tests  --   ( -) lumbar special tests               Objective measurements completed on examination: See above findings.      [facility] Adult PT Treatment/Exercise - 12/24/19 0001      Exercises   Exercises  Ankle      Ankle Exercises: Stretches   Gastroc Stretch  3 reps;30 seconds    Gastroc Stretch Limitations  bilat at wall      Ankle Exercises: Standing   SLS  10 attempts with max hold each side      Ankle Exercises: Seated   Other Seated Ankle Exercises  30 reps bilat eversion with red band             PT Education - 12/24/19 0957    Education Details  HEP and POC    Person(s) Educated  Patient    Methods  Explanation;Demonstration;Handout    Comprehension  Returned demonstration;Verbalized understanding  PT Long Term Goals - 12/24/19 1013      PT LONG TERM GOAL #1   Title  I with advanced HEP to include a walking program ( 01/21/2020)    Time  4    Period  Weeks    Status  New    Target Date  01/21/20      PT LONG TERM GOAL #2   Title  improve Rt hip and ankle strength =/> 4+/5 throughout to allow her to perform SLS =/> 10 sec ( 01/21/2020)    Time  4    Period  Weeks    Status  New    Target Date  01/21/20      PT LONG TERM GOAL #3    Title  improve LEFS =/> 71/80, 88.8% ( 01/21/2020)    Time  4    Period  Weeks    Status  New    Target Date  01/21/20      PT LONG TERM GOAL #4   Title  increase bilat ankle DF =/> 12 degrees ( 01/21/2020)    Time  4    Period  Weeks    Status  New    Target Date  01/21/20      PT LONG TERM GOAL #5   Title  report =/> 75% reduction of pain with prolonged walking/standing ( 01/21/2020)    Time  4    Period  Weeks    Status  New    Target Date  01/21/20             Plan - 12/24/19 1008    Clinical Impression Statement  69yo female with ~ 24month h/o Rt lateral lower leg pain.  She reports she was taking down her Christmas things and initially had low back pain and then it moved into her leg.  She is finishing up prednisone, take mobix and using voltaren gel so symptoms were much less today than they may have been.  Lumbar special tests were negative.  She does have core, Rt hip and ankle weakness. Tightness and tender in the Rt gastroc and peroneals.  Her proprioception is impaired as well however this may be from prior CVAs.  She is concxerned about how her Rt great toe is moving to the outside, she does have full ROM in it.  She would benefit from PT to address her defecits and monitor symptoms as she weans off prednisone.  We will monitor and reasses to make sure syptoms are not from the back.    Personal Factors and Comorbidities  Comorbidity 3+    Comorbidities  multiple CVAs, HTN, bilat bunionectomy, cervical DJD, arthritis and GERD    Examination-Activity Limitations  Other;Stand    Examination-Participation Restrictions  Cleaning;Community Activity;Shop    Stability/Clinical Decision Making  Evolving/Moderate complexity    Clinical Decision Making  Moderate    Rehab Potential  Good    PT Frequency  2x / week    PT Duration  4 weeks    PT Treatment/Interventions  Taping;Vasopneumatic Device;Functional mobility training;Patient/family education;Moist Heat;Ultrasound;Passive  range of motion;Therapeutic exercise;Cryotherapy;Electrical Stimulation;Neuromuscular re-education;Manual techniques;Dry needling    PT Next Visit Plan  manual work to [employer] and peroneals, LE and core strengthening and proprioception    Consulted and Agree with Plan of Care  Patient       Patient will benefit from skilled therapeutic intervention in order to improve the following deficits and impairments:  Difficulty walking, Obesity, Pain, Decreased strength, Increased  muscle spasms  Visit Diagnosis: Pain in right lower leg - Plan: PT plan of care cert/re-cert  Muscle weakness (generalized) - Plan: PT plan of care cert/re-cert  Difficulty in walking, not elsewhere classified - Plan: PT plan of care cert/re-cert     Problem List Patient Active Problem List   Diagnosis Date Noted  . Painful legs and moving toes of right foot 12/16/2019  . Skin nodule 11/16/2019  . Nocturia 11/16/2019  . Low back pain 11/16/2019  . Alopecia 01/31/2019  . Upper airway cough syndrome 06/14/2018  . Rash 05/30/2018  . Vitamin D deficiency 08/31/2017  . Facial contusion, subsequent encounter 03/12/2017  . Vertigo 03/12/2017  . Right rotator cuff tear 07/27/2016  . Right shoulder pain 07/11/2016  . Overactive bladder 01/19/2016  . Cough variant asthma 09/07/2015  . Burn 09/06/2015  . Vocal cord dysfunction 04/05/2015  . Asthma with exacerbation 04/05/2015  . Left knee pain 03/12/2015  . Right knee pain 03/12/2015  . Cough 12/08/2014  . Skin lumps, generalized 09/08/2014  . Toe pain, right 08/06/2014  . Pre-ulcerative corn or callous 08/06/2014  . Hammertoe 08/06/2014  . Swelling of joint, ankle, right 08/06/2014  . Pansinusitis 06/02/2014  . Cerebral thrombosis with cerebral infarction ([place]) 05/26/2014  . Weakness 05/25/2014  . Right arm pain 05/25/2014  . Numbness in right leg 05/25/2014  . Numbness and tingling of right arm 05/25/2014  . Embolic stroke ([place]) 99991111  . Aphasia  as late effect of cerebrovascular accident 02/10/2014  . Alterations of sensations, late effect of cerebrovascular disease(438.6) 02/10/2014  . Cerebral infarction ([place]) 11/25/2013  . Hyperlipidemia 11/24/2013  . Syncope 11/20/2013  . Primary hyperaldosteronism ([facility]) 11/06/2013  . Back pain 11/06/2013  . Hypokalemia 05/06/2013  . Family history of colon cancer 05/06/2013  . Lipoma 09/03/2012  . Hyperglycemia 08/15/2012  . Constipation 01/02/2012  . Preventative health care 05/24/2011  . 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 HYPERSOMNIA WITH SLEEP APNEA UNSPECIFIED 01/26/2011  . UTI (urinary tract infection) 11/15/2010  . NASAL POLYP 07/28/2010  . FIBROIDS, UTERUS 07/27/2010  . COMPUTERIZED TOMOGRAPHY, CHEST, ABNORMAL 09/16/2009  . Seasonal and perennial allergic rhinitis 05/29/2009  . PERIPHERAL EDEMA 05/28/2009  . Mild persistent chronic asthma without complication [id number]  . LEG PAIN, LEFT 06/30/2008  . COLONIC POLYPS, HX OF 06/30/2008  . OBESITY 12/12/2007  . Depression 12/12/2007  . Essential hypertension 12/12/2007  . RHINOSINUSITIS, CHRONIC 12/12/2007  . GERD 12/12/2007  . ARTHRITIS 12/12/2007    Jeral Pinch PT  12/24/2019, 12:42 PM  [address] [place], [place], [address] Phone: [phone]   Fax:  [phone]  Name: Andrea Mason MRN: [id number] Date of Birth: 26-Jul-1951

## 2019-12-26 ENCOUNTER — Emergency Department (HOSPITAL_COMMUNITY)
Admission: EM | Admit: 2019-12-26 | Discharge: 2019-12-27 | Disposition: A | Discharge status: Home / Self Care | Payer: PPO | Attending: Emergency Medicine | Admitting: Emergency Medicine

## 2019-12-26 ENCOUNTER — Emergency Department (HOSPITAL_COMMUNITY): Payer: PPO

## 2019-12-26 ENCOUNTER — Other Ambulatory Visit: Payer: Self-pay

## 2019-12-26 ENCOUNTER — Encounter (HOSPITAL_COMMUNITY): Payer: Self-pay

## 2019-12-26 DIAGNOSIS — I10 Essential (primary) hypertension: Secondary | ICD-10-CM | POA: Diagnosis not present

## 2019-12-26 DIAGNOSIS — R4781 Slurred speech: Secondary | ICD-10-CM | POA: Diagnosis not present

## 2019-12-26 DIAGNOSIS — R531 Weakness: Secondary | ICD-10-CM | POA: Diagnosis not present

## 2019-12-26 DIAGNOSIS — R55 Syncope and collapse: Secondary | ICD-10-CM | POA: Diagnosis not present

## 2019-12-26 DIAGNOSIS — R42 Dizziness and giddiness: Secondary | ICD-10-CM | POA: Diagnosis not present

## 2019-12-26 DIAGNOSIS — Z79899 Other long term (current) drug therapy: Secondary | ICD-10-CM | POA: Diagnosis not present

## 2019-12-26 DIAGNOSIS — R231 Pallor: Secondary | ICD-10-CM | POA: Diagnosis not present

## 2019-12-26 DIAGNOSIS — R110 Nausea: Secondary | ICD-10-CM | POA: Diagnosis not present

## 2019-12-26 DIAGNOSIS — R0602 Shortness of breath: Secondary | ICD-10-CM | POA: Insufficient documentation

## 2019-12-26 DIAGNOSIS — R29818 Other symptoms and signs involving the nervous system: Secondary | ICD-10-CM | POA: Diagnosis not present

## 2019-12-26 DIAGNOSIS — J45909 Unspecified asthma, uncomplicated: Secondary | ICD-10-CM | POA: Insufficient documentation

## 2019-12-26 LAB — CBC
HCT: 42.1 % (ref 36.0–46.0)
Hemoglobin: 12.8 g/dL (ref 12.0–15.0)
MCH: 30.3 pg (ref 26.0–34.0)
MCHC: 30.4 g/dL (ref 30.0–36.0)
MCV: 99.8 fL (ref 80.0–100.0)
Platelets: 236 10*3/uL (ref 150–400)
RBC: 4.22 MIL/uL (ref 3.87–5.11)
RDW: 13.2 % (ref 11.5–15.5)
WBC: 9.9 10*3/uL (ref 4.0–10.5)
nRBC: 0 % (ref 0.0–0.2)

## 2019-12-26 LAB — DIFFERENTIAL
Abs Immature Granulocytes: 0.05 10*3/uL (ref 0.00–0.07)
Basophils Absolute: 0.1 10*3/uL (ref 0.0–0.1)
Basophils Relative: 1 %
Eosinophils Absolute: 0.5 10*3/uL (ref 0.0–0.5)
Eosinophils Relative: 5 %
Immature Granulocytes: 1 %
Lymphocytes Relative: 32 %
Lymphs Abs: 3.1 10*3/uL (ref 0.7–4.0)
Monocytes Absolute: 0.8 10*3/uL (ref 0.1–1.0)
Monocytes Relative: 8 %
Neutro Abs: 5.3 10*3/uL (ref 1.7–7.7)
Neutrophils Relative %: 53 %

## 2019-12-26 LAB — PROTIME-INR
INR: 1 (ref 0.8–1.2)
Prothrombin Time: 13.1 seconds (ref 11.4–15.2)

## 2019-12-26 LAB — I-STAT CHEM 8, ED
BUN: 40 mg/dL — ABNORMAL HIGH (ref 8–23)
Calcium, Ion: 1.14 mmol/L — ABNORMAL LOW (ref 1.15–1.40)
Chloride: 107 mmol/L (ref 98–111)
Creatinine, Ser: 1.5 mg/dL — ABNORMAL HIGH (ref 0.44–1.00)
Glucose, Bld: 97 mg/dL (ref 70–99)
HCT: 39 % (ref 36.0–46.0)
Hemoglobin: 13.3 g/dL (ref 12.0–15.0)
Potassium: 4.3 mmol/L (ref 3.5–5.1)
Sodium: 138 mmol/L (ref 135–145)
TCO2: 24 mmol/L (ref 22–32)

## 2019-12-26 LAB — APTT: aPTT: 22 seconds — ABNORMAL LOW (ref 24–36)

## 2019-12-26 MED ORDER — LORAZEPAM 2 MG/ML IJ SOLN
1.0000 mg | Freq: Once | INTRAMUSCULAR | Status: AC
  Administered 2019-12-26: 1 mg via INTRAVENOUS
  Filled 2019-12-26: qty 1

## 2019-12-26 NOTE — ED Provider Notes (Signed)
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  Patient reports tonight just prior to arrival while sitting on her couch she started to feel poorly.  States she began to suddenly sweat and felt short of breath.  She had to use her inhaler.  She states that the rescue inhaler did help some with her shortness of breath.  She had no chest pain or chest discomfort.  She states that she got up to go to the bathroom because she felt like she was going to throw up and threw up several times.  No diarrhea.  States in the bathroom she felt very dizzy which she describes as feeling like she was going to pass out.  She describes as a lightheadedness and vertiginous symptoms.  She had no new numbness or focal weakness.  No headache, fall or head injury.  Denies any recent fevers, cough.  No known Covid exposures.  No symptoms currently.  ROS: See HPI Constitutional: no fever  Eyes: no drainage  ENT: no runny nose   Cardiovascular:  no chest pain  Resp:  SOB  GI:  vomiting GU: no dysuria Integumentary: no rash  Allergy: no hives  Musculoskeletal: no leg swelling  Neurological: no slurred speech ROS otherwise negative  PAST MEDICAL HISTORY/PAST SURGICAL HISTORY:  Past Medical History:  Diagnosis Date  . Allergic rhinitis   . Allergy   . Asthma   . Depression   . DJD (degenerative joint disease), cervical   . GERD (gastroesophageal reflux disease)   . History of colonic polyps   . Hypertension   . Obesity   . 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 Stroke ([place])   . Syncope     MEDICATIONS:  Prior to Admission medications   Medication Sig Start Date End Date Taking? Authorizing Provider  acetaminophen (TYLENOL) 325 MG tablet Per bottle as needed    [provider]  aspirin 325 MG tablet Take 325 mg by  mouth daily.    [provider]  Biotin 10000 MCG TABS Take 10,000 mcg by mouth daily.    [provider]  cetirizine (ZYRTEC ALLERGY) 10 MG tablet Take 10 mg by mouth at bedtime.     [provider]  Cholecalciferol (VITAMIN D3) 2000 units TABS Take 2,000 Units by mouth daily.    [provider]  dextromethorphan-guaiFENesin (MUCINEX DM) 30-600 MG 12hr tablet Take 1-2 tablets by mouth 2 (two) times daily as needed (for congestion/cough (WITH FLUTTER VALVE)).     [provider]  fesoterodine (TOVIAZ) 8 MG TB24 tablet Take 1 tablet (8 mg total) by mouth daily. 12/16/19   Biagio Borg, MD  Magnesium 250 MG TABS Take 250 mg by mouth daily with lunch.    [provider]  meloxicam (MOBIC) 15 MG tablet Take 1 tablet (15 mg total) by mouth daily. 11/13/19   Biagio Borg, MD  mometasone-formoterol (DULERA) 100-5 MCG/ACT AERO Inhale 2 puffs into the lungs 2 (two) times daily. 01/21/18   Tanda Rockers, MD  montelukast (SINGULAIR) 10 MG tablet Take 1 tablet (10 mg total) by mouth at bedtime. 12/22/19   Tanda Rockers, MD  oxymetazoline (AFRIN) 0.05 % nasal spray Place 2 sprays into both nostrils 2 (two) times daily as needed (nasal stuffiness and nasal pressure for 5 days).     [provider]  pantoprazole (PROTONIX) 40 MG tablet Take 1 tablet (40 mg total) by mouth daily. 07/24/19   Biagio Borg, MD  Pitavastatin Calcium (LIVALO) 4 MG TABS Take 1 tablet (4 mg total) by mouth every other day. 12/12/19   Biagio Borg, MD  predniSONE (DELTASONE) 10 MG tablet 3 tabs by mouth per day for 3 days,2tabs per day for 3 days,1tab per day for 3 days 12/16/19   Biagio Borg, MD  sodium chloride (OCEAN) 0.65 % SOLN nasal spray Place 2 sprays into both nostrils as needed (dry nose).     [provider]  spironolactone (ALDACTONE) 25 MG tablet Take 25 mg by mouth daily.     [provider]  triamcinolone (NASACORT) 55 MCG/ACT AERO nasal  inhaler Place 2 sprays into the nose daily. 06/03/19   Biagio Borg, MD  triamcinolone cream (KENALOG) 0.1 % Apply 1 application topically 2 (two) times daily as needed. 01/31/19 01/31/20  Biagio Borg, MD    ALLERGIES:  Allergies  Allergen Reactions  . Tramadol Other (See Comments)    Almost passed out  . Ivp Dye [Iodinated Diagnostic Agents] Nausea And Vomiting and Other (See Comments)    Reaction: hot flashes  . Promethazine-Codeine Nausea And Vomiting    SOCIAL HISTORY:  Social History   Tobacco Use  . Smoking status: Never Smoker  . Smokeless tobacco: Never Used  Substance Use Topics  . Alcohol use: Yes    Comment: socially wine    FAMILY HISTORY: Family History  Problem Relation Age of Onset  . Colon cancer Brother   . Stroke Brother   . Arrhythmia Brother   . Cancer Mother   . Diabetes Sister   . Lung disease Son        Sarcoid vs ARDS related fibrosis, required 2 double lung transplants  . Congenital heart disease Brother   . Heart attack Brother   . Esophageal cancer Neg Hx   . Rectal cancer Neg Hx   . Stomach cancer Neg Hx     EXAM: BP 108/67   Pulse 73   Temp 97.7 F (36.5 C) (Oral)   Resp 18   Ht 5\' 2"  (1.575 m)   Wt 89.2 kg   LMP  (LMP Unknown)   SpO2 92%   BMI 35.98 kg/m  CONSTITUTIONAL: Alert and oriented and responds appropriately to questions. Well-appearing; well-nourished HEAD: Normocephalic EYES: Conjunctivae clear, pupils appear equal, EOM appear intact ENT: normal nose; moist mucous membranes NECK: Supple, normal ROM CARD: RRR; S1 and S2 appreciated; no murmurs, no clicks, no rubs, no gallops RESP: Normal chest excursion without splinting or tachypnea; breath sounds clear and equal bilaterally; no wheezes, no rhonchi, no rales, no hypoxia or respiratory distress, speaking full sentences ABD/GI: Normal bowel sounds; non-distended; soft, non-tender, no rebound, no guarding, no peritoneal signs, no hepatosplenomegaly BACK:  The back  appears normal EXT: Normal ROM in all joints; no deformity noted, no edema; no cyanosis SKIN: Normal color for age and race; warm; no rash on exposed skin NEURO: Moves all extremities equally, has mild right sensation in the right face and right arm and right leg is diminished compared to the left but it this is all chronic from stroke in 2014, slightly diminished strength in the right upper and lower extremity compared to the left but no drift -this is also chronic, very minimal dysarthria on my examination, no aphasia, cranial nerves II through XII intact PSYCH: The patient's mood and  manner are appropriate.   MEDICAL DECISION MAKING: Patient here as a code stroke.  Symptoms sound more typical of a presyncopal event.  This could have been her anginal equivalent.  She has no history of CAD.  She denies any chest pain or chest discomfort.  Her EKG today shows no ischemia, interval change or arrhythmia.  Seen by Dr. Lorraine Lax with neurology who is canceled code stroke.  Patient not a TPA candidate.  Head CT shows no acute abnormality.  Neurology does recommend MRI of the brain without contrast.  Will obtain cardiac labs, check hemoglobin, electrolytes, urine.  ED PROGRESS: 3:12 AM  Patient has had 2 normal troponins.  Normal electrolytes and normal hemoglobin.  Creatinine mildly elevated at 1.5 and she is receiving gentle IV hydration.  Does have history of grade 1 diastolic dysfunction.  No arrhythmia witnessed on the cardiac monitoring.  MRI shows no acute stroke.  Chest x-ray shows normal cardiac silhouette but there is a patchy right lung base opacity that is likely atelectasis.  She has no fevers, cough or infectious symptoms to suggest pneumonia.  No current chest pain or shortness of breath.  Patient still asymptomatic.  Currently staff obtaining urine.  Anticipate if patient is still doing well that she will be discharged home.  3:50 AM  Pt's urine shows no sign of infection, dehydration.  Drug screen  negative.  Will p.o. challenge, ambulate.  If patient does well, will discharge home.   At this time, I do not feel there is any life-threatening condition present. I have reviewed, interpreted and discussed all results (EKG, imaging, lab, urine as appropriate) and exam findings with patient/family. I have reviewed nursing notes and appropriate previous records.  I feel the patient is safe to be discharged home without further emergent workup and can continue workup as an outpatient as needed. Discussed usual and customary return precautions. Patient/family verbalize understanding and are comfortable with this plan.  Outpatient follow-up has been provided as needed. All questions have been answered.    EKG Interpretation  Date/Time:  Friday December 26 2019 23:06:17 EST Ventricular Rate:  73 PR Interval:    QRS Duration: 85 QT Interval:  388 QTC Calculation: 428 R Axis:   19 Text Interpretation: Sinus rhythm Abnormal R-wave progression, early transition No significant change since last tracing Confirmed by Pryor Curia [phone]) on 12/26/2019 11:37:51 PM         Candyce Churn was evaluated in Emergency Department on 12/26/2019 for the symptoms described in the history of present illness. She was evaluated in the context of the global COVID-19 pandemic, which necessitated consideration that the patient might be at risk for infection with the SARS-CoV-2 virus that causes COVID-19. Institutional protocols and algorithms that pertain to the evaluation of patients at risk for COVID-19 are in a state of rapid change based on information released by regulatory bodies including the CDC and federal and state organizations. These policies and algorithms were followed during the patient's care in the ED.  Patient was seen wearing N95, face shield, gloves.    Merritt Mccravy, Delice Bison, DO 12/27/19 [phone]

## 2019-12-26 NOTE — ED Notes (Signed)
Unsuccessful blood draw attempt. 

## 2019-12-26 NOTE — Code Documentation (Signed)
Responded to Code Stroke called at 2222 for slurred speech, [id number]. Pt was watching tv and all of a sudden developed dizziness, vomited, and then noticed her speech was slurred. Pt arrived at 2236, NIH-4 for slurred speech, R leg drift, and R sided sensory deficit. CT head-no acute changes. Pt placed on TIA alert. Plan for MRI.

## 2019-12-26 NOTE — ED Triage Notes (Signed)
Pt arrived via [facility] from home for c/o slurred speech, dizziness, N/V, diaphretic, SOB. EMS states pt was pale when they got there. Pt has right sided deficits from prior strokes. NIH 4 Pt NSR on monitor. Pt A&Ox4.

## 2019-12-27 ENCOUNTER — Emergency Department (HOSPITAL_COMMUNITY): Payer: PPO

## 2019-12-27 DIAGNOSIS — R55 Syncope and collapse: Secondary | ICD-10-CM | POA: Diagnosis not present

## 2019-12-27 DIAGNOSIS — R0602 Shortness of breath: Secondary | ICD-10-CM | POA: Diagnosis not present

## 2019-12-27 DIAGNOSIS — R4781 Slurred speech: Secondary | ICD-10-CM | POA: Diagnosis not present

## 2019-12-27 LAB — URINALYSIS, ROUTINE W REFLEX MICROSCOPIC
Bilirubin Urine: NEGATIVE
Glucose, UA: NEGATIVE mg/dL
Hgb urine dipstick: NEGATIVE
Ketones, ur: NEGATIVE mg/dL
Nitrite: NEGATIVE
Protein, ur: NEGATIVE mg/dL
Specific Gravity, Urine: 1.015 (ref 1.005–1.030)
pH: 5 (ref 5.0–8.0)

## 2019-12-27 LAB — TROPONIN I (HIGH SENSITIVITY)
Troponin I (High Sensitivity): 2 ng/L (ref <18)
Troponin I (High Sensitivity): <2 ng/L (ref <18)

## 2019-12-27 LAB — ETHANOL: Alcohol, Ethyl (B): <10 mg/dL (ref <10)

## 2019-12-27 LAB — COMPREHENSIVE METABOLIC PANEL
ALT: 18 U/L (ref 0–44)
AST: 18 U/L (ref 15–41)
Albumin: 3.7 g/dL (ref 3.5–5.0)
Alkaline Phosphatase: 89 U/L (ref 38–126)
Anion gap: 9 (ref 5–15)
BUN: 37 mg/dL — ABNORMAL HIGH (ref 8–23)
CO2: 24 mmol/L (ref 22–32)
Calcium: 8.9 mg/dL (ref 8.9–10.3)
Chloride: 104 mmol/L (ref 98–111)
Creatinine, Ser: 1.52 mg/dL — ABNORMAL HIGH (ref 0.44–1.00)
GFR calc Af Amer: 41 mL/min — ABNORMAL LOW (ref >60)
GFR calc non Af Amer: 35 mL/min — ABNORMAL LOW (ref >60)
Glucose, Bld: 100 mg/dL — ABNORMAL HIGH (ref 70–99)
Potassium: 4.4 mmol/L (ref 3.5–5.1)
Sodium: 137 mmol/L (ref 135–145)
Total Bilirubin: 0.8 mg/dL (ref 0.3–1.2)
Total Protein: 7.1 g/dL (ref 6.5–8.1)

## 2019-12-27 LAB — RAPID URINE DRUG SCREEN, HOSP PERFORMED
Amphetamines: NOT DETECTED
Barbiturates: NOT DETECTED
Benzodiazepines: NOT DETECTED
Cocaine: NOT DETECTED
Opiates: NOT DETECTED
Tetrahydrocannabinol: NOT DETECTED

## 2019-12-27 MED ORDER — SODIUM CHLORIDE 0.9 % IV BOLUS (SEPSIS)
1000.0000 mL | Freq: Once | INTRAVENOUS | Status: AC
  Administered 2019-12-27: 1000 mL via INTRAVENOUS

## 2019-12-27 NOTE — ED Notes (Signed)
Pt was able to ambulate in the hall and tolerate fluids well.

## 2019-12-27 NOTE — Consult Note (Signed)
Requesting Physician: Dr. Leonides Schanz    Chief Complaint:   History obtained from: Patient and Chart   HPI:                                                                                                                                       Andrea Mason is a 69 y.o. female with past medical history of left MCA stroke with aphasia and residual right hemiparesis presents to the emergency department after calling EMS for sudden onset diaphoresis, vomiting and dizziness.  Last known normal was 9 PM. Patient was watching TV and suddenly when she got up became diaphoretic, felt dizzy and started throw up. She called 911 and when they arrived they noticed that her speech was abnormal. They contacted family who also  noted that the speech was not her baseline. No obvious facial droop. Blood pressure was low. She had some mild right-sided weakness which appears to be chronic from her old stroke, however patient states it has been worse since December and was recommended PT/ OT.  Stat CT head was obtained which showed no acute findings. NIH stroke scale was 3 was reduced sensation on the right, right leg drift and mild aphasia (last 2 components were chronic). tPA was not administered as symptoms are mild and nondisabling.  Chart review Regarding history of prior stroke, occurred in Dec2014. Noted to have left insular cortex and parietal lobe infarct. Patient underwent thorough work-up including TEE and Linq device was placed which was removed in 2018 after going no evidence of arrhythmias. Patient is on aspirin.   Date last known well: 1.22-21 Time last known well: 9 tPA Given: No, symptoms are mild and nondisabling NIHSS: 3 Baseline MRS 0    Past Medical History:  Diagnosis Date   Allergic rhinitis    Allergy    Asthma    Depression    DJD (degenerative joint disease), cervical    GERD (gastroesophageal reflux disease)    History of colonic polyps    Hypertension    Obesity    Primary  hyperaldosteronism ([place]) 11/06/2013   Stroke [facility])    Syncope     Past Surgical History:  Procedure Laterality Date   COLONOSCOPY     FOOT SURGERY  1998   Implantable loop recorder placement  09/16/14   MDT LINQ implanted by Dr Rayann Heman for cryptogenic stroke, RIO II protocol   LOOP RECORDER REMOVAL N/A 10/02/2017   Procedure: LOOP RECORDER REMOVAL;  Surgeon: [name], MD;  Location: [place] [facility];  Service: Cardiovascular;  Laterality: N/A;   NASAL POLYP SURGERY  07/2006   x 2 with Dr. [name]   NASAL SINUS SURGERY  07/2006   Dr. [name]   TUBAL LIGATION      Family History  Problem Relation Age of Onset   Colon cancer Brother    Stroke Brother    Arrhythmia Brother  Cancer Mother    Diabetes Sister    Lung disease Son        Sarcoid vs ARDS related fibrosis, required 2 double lung transplants   Congenital heart disease Brother    Heart attack Brother    Esophageal cancer Neg Hx    Rectal cancer Neg Hx    Stomach cancer Neg Hx    Social History:  reports that she has never smoked. She has never used smokeless tobacco. She reports current alcohol use. She reports that she does not use drugs.  Allergies:  Allergies  Allergen Reactions   Tramadol Other (See Comments)    Almost passed out   Ivp Dye [Iodinated Diagnostic Agents] Nausea And Vomiting and Other (See Comments)    Reaction: hot flashes   Promethazine-Codeine Nausea And Vomiting    Medications:                                                                                                                        I reviewed home medications   ROS:                                                                                                                                     14 systems reviewed and negative except above    Examination:                                                                                                      General: Appears well-developed  Psych: Affect  appropriate to situation Eyes: No scleral injection HENT: No OP obstrucion Head: Normocephalic.  Cardiovascular: Normal rate and regular rhythm. Respiratory: Effort normal and breath sounds normal to anterior ascultation GI: Soft.  No distension. There is no tenderness.  Skin: WDI    Neurological Examination Mental Status: Alert, oriented, thought content appropriate.  Speech fluent without evidence of aphasia. Able to follow 3 step commands without difficulty. Cranial Nerves: II: Visual fields grossly normal,  III,IV, VI: ptosis  not present, extra-ocular motions intact bilaterally, pupils equal, round, reactive to light and accommodation V,VII: smile symmetric, facial light touch sensation normal bilaterally VIII: hearing normal bilaterally IX,X: uvula rises symmetrically XI: bilateral shoulder shrug XII: midline tongue extension Motor: Right : Upper extremity   5/5    Left:     Upper extremity   5/5  Lower extremity   5/5     Lower extremity   5/5 Tone and bulk:normal tone throughout; no atrophy noted Sensory: Pinprick and light touch intact throughout, bilaterally Deep Tendon Reflexes: 2+ and symmetric throughout Plantars: Right: downgoing   Left: downgoing Cerebellar: normal finger-to-nose, normal rapid alternating movements and normal heel-to-shin test Gait: normal gait and station     Lab Results: Basic Metabolic Panel: Recent Labs  Lab 12/26/19 2245 12/26/19 2333  NA 137 138  K 4.4 4.3  CL 104 107  CO2 24  --   GLUCOSE 100* 97  BUN 37* 40*  CREATININE 1.52* 1.50*  CALCIUM 8.9  --     CBC: Recent Labs  Lab 12/26/19 2245 12/26/19 2333  WBC 9.9  --   NEUTROABS 5.3  --   HGB 12.8 13.3  HCT 42.1 39.0  MCV 99.8  --   PLT 236  --     Coagulation Studies: Recent Labs    12/26/19 2245  LABPROT 13.1  INR 1.0    Imaging: MR BRAIN WO CONTRAST  Result Date: 12/27/2019 CLINICAL DATA:  Slurred speech EXAM: MRI HEAD WITHOUT CONTRAST TECHNIQUE:  Multiplanar, multiecho pulse sequences of the brain and surrounding structures were obtained without intravenous contrast. COMPARISON:  Brain MRI 05/25/2014 Head CT 12/26/2019 FINDINGS: BRAIN: No acute infarct, acute hemorrhage or extra-axial collection. 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 White matter signal otherwise normal. Normal volume of brain parenchyma and CSF spaces. Midline structures are normal. VASCULAR: Major flow voids are preserved. Susceptibility-sensitive sequences show no chronic microhemorrhage or superficial siderosis. SKULL AND UPPER CERVICAL SPINE: Normal calvarium and skull base. Visualized upper cervical spine and soft tissues are normal. SINUSES/ORBITS: Prior functional endoscopic sinus surgery. No mastoid or middle ear effusion. Normal orbits. IMPRESSION: 1. No acute intracranial process. 2. 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 Electronically Signed   By: Ulyses Jarred M.D.   On: 12/27/2019 00:43   DG Chest Portable 1 View  Result Date: 12/27/2019 CLINICAL DATA:  Shortness of breath. Near syncope. Dizziness. EXAM: PORTABLE CHEST 1 VIEW COMPARISON:  08/27/2017 FINDINGS: Upper normal heart size with normal mediastinal contours, heart size likely accentuated by low lung volumes. Patchy opacity at the right lung base. No pulmonary edema, pleural fluid or pneumothorax. No acute osseous abnormalities are seen. IMPRESSION: Patchy right lung base opacity, may be atelectasis or pneumonia in the setting of low lung volumes. Electronically Signed   By: Keith Rake M.D.   On: 12/27/2019 00:58   CT HEAD CODE STROKE WO CONTRAST  Result Date: 12/26/2019 CLINICAL DATA:  Code stroke.  Slurred speech EXAM: CT HEAD WITHOUT CONTRAST TECHNIQUE: Contiguous axial images were obtained from the base of the skull through the vertex without intravenous contrast. COMPARISON:  08/27/2017 FINDINGS: Brain: There is no mass, hemorrhage or extra-axial collection. The size and configuration of the ventricles and  extra-axial CSF spaces are normal. There is an old posterior left MCA territory infarct, unchanged. Vascular: No abnormal hyperdensity of the major intracranial arteries or dural venous sinuses. No intracranial atherosclerosis. Skull: The visualized skull base, calvarium and extracranial soft tissues are normal. Sinuses/Orbits: No fluid levels or advanced mucosal thickening  of the visualized paranasal sinuses. No mastoid or middle ear effusion. The orbits are normal. ASPECTS [facility] Stroke Program Early CT Score) - Ganglionic level infarction (caudate, lentiform nuclei, internal capsule, insula, M1-M3 cortex): 7 - Supraganglionic infarction (M4-M6 cortex): 3 Total score (0-10 with 10 being normal): 10 IMPRESSION: 1. Old left MCA territory infarct without acute intracranial abnormality. 2. ASPECTS is 10. * These results were communicated to Dr. Karena Addison Eureka Valdes at 11:12 pm on 12/26/2019 by text page via the [facility] messaging system. Electronically Signed   By: Ulyses Jarred M.D.   On: 12/26/2019 23:12     ASSESSMENT AND PLAN   69 y.o. female with past medical history of left MCA stroke with aphasia and residual right hemiparesis presents to the emergency department after calling EMS for sudden onset diaphoresis, vomiting and dizziness. Regarding her speech abnormality, no significant slurred speech. Comprehension and naming mostly intact along with repetition. She has some paraphasic errors. Likely due to recrudescence of old stroke symptoms, however would more obtain MRI brain to rule out new stroke.  Presyncope, less likely TIA   Recommendations MRI brain without contrast Orthostatic vitals and metabolic/infectious work-up Continue aspirin, high intensity statin    Addendum MRI negative for acute stroke, patient discharged from ED  Recommend outpatient follow up  [place] Pager Number [phone]

## 2019-12-27 NOTE — Discharge Instructions (Signed)
Your labs, EKG, chest x-ray, urine, brain MRI showed no acute abnormality today.  I recommend close follow-up with your primary care physician.

## 2019-12-27 NOTE — ED Notes (Signed)
Patient verbalizes understanding of discharge instructions. Opportunity for questioning and answers were provided. Armband removed by staff, pt discharged from ED. Pt. ambulatory and discharged home.  

## 2019-12-29 ENCOUNTER — Telehealth: Payer: Self-pay

## 2019-12-29 NOTE — Telephone Encounter (Signed)
Patient went to the hospital Friday. Patient have some additional questions

## 2019-12-30 ENCOUNTER — Encounter: Payer: Self-pay | Admitting: Physical Therapy

## 2019-12-30 ENCOUNTER — Other Ambulatory Visit: Payer: Self-pay

## 2019-12-30 ENCOUNTER — Ambulatory Visit: Payer: PPO | Admitting: Physical Therapy

## 2019-12-30 DIAGNOSIS — R262 Difficulty in walking, not elsewhere classified: Secondary | ICD-10-CM

## 2019-12-30 DIAGNOSIS — M6281 Muscle weakness (generalized): Secondary | ICD-10-CM

## 2019-12-30 DIAGNOSIS — M79661 Pain in right lower leg: Secondary | ICD-10-CM

## 2019-12-30 NOTE — Telephone Encounter (Signed)
Called pt she was d/c from ER on /Friday, and was needing f/u appt w/Dr. Jenny Reichmann. She states she was on the other line speaking w/[name] now...Andrea Mason

## 2019-12-30 NOTE — Therapy (Signed)
[address] [place], [place], [address] Phone: [phone]   Fax:  [phone]  Physical Therapy Treatment  Patient Details  Name: Andrea Mason MRN: [id number] Date of Birth: August 02, 1951 Referring Provider (PT): Dr Lynne Leader   Encounter Date: 12/30/2019  PT End of Session - 12/30/19 1426    Visit Number  2    Date for PT Re-Evaluation  01/21/20    Authorization Type  health team advantage    PT Start Time  1356    PT Stop Time  1430    PT Time Calculation (min)  34 min    Activity Tolerance  Patient tolerated treatment well    Behavior During Therapy  [facility] for tasks assessed/performed       Past Medical History:  Diagnosis Date  . Allergic rhinitis   . Allergy   . Asthma   . Depression   . DJD (degenerative joint disease), cervical   . GERD (gastroesophageal reflux disease)   . History of colonic polyps   . Hypertension   . Obesity   . 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 Stroke ([place])   . Syncope     Past Surgical History:  Procedure Laterality Date  . COLONOSCOPY    . FOOT SURGERY  1998  . 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 LOOP RECORDER REMOVAL N/A 10/02/2017   Procedure: LOOP RECORDER REMOVAL;  Surgeon: [name], MD;  Location: [place] [facility];  Service: Cardiovascular;  Laterality: N/A;  . NASAL POLYP SURGERY  07/2006   x 2 with Dr. [name]  . NASAL SINUS SURGERY  07/2006   Dr. [name]  . TUBAL LIGATION      There were no vitals filed for this visit.  Subjective Assessment - 12/30/19 1402    Subjective  "I feel better than I did Friday night" Leg is ok right now    Currently in Pain?  No/denies                       [facility] Adult PT Treatment/Exercise - 12/30/19 0001      Exercises   Exercises  Knee/Hip      Knee/Hip Exercises: Aerobic   Recumbent Bike   L1 x 4 min       Knee/Hip Exercises: Machines for Strengthening   Other Machine  Rows & Lats 20lb 2x10       Knee/Hip Exercises: Seated   Long Arc Quad  Right;2 sets;10 reps    Long Arc Quad Weight  2 lbs.    Ball Squeeze  2x10    Hamstring Curl  Right;Strengthening;2 sets;10 reps    Hamstring Limitations  green tband     Sit to Sand  2 sets;10 reps                  PT Long Term Goals - 12/24/19 1013      PT LONG TERM GOAL #1   Title  I with advanced HEP to include a walking program ( 01/21/2020)    Time  4    Period  Weeks    Status  New    Target Date  01/21/20      PT LONG TERM GOAL #2   Title  improve Rt hip and ankle strength =/> 4+/5 throughout to allow her to perform SLS =/> 10  sec ( 01/21/2020)    Time  4    Period  Weeks    Status  New    Target Date  01/21/20      PT LONG TERM GOAL #3   Title  improve LEFS =/> 71/80, 88.8% ( 01/21/2020)    Time  4    Period  Weeks    Status  New    Target Date  01/21/20      PT LONG TERM GOAL #4   Title  increase bilat ankle DF =/> 12 degrees ( 01/21/2020)    Time  4    Period  Weeks    Status  New    Target Date  01/21/20      PT LONG TERM GOAL #5   Title  report =/> 75% reduction of pain with prolonged walking/standing ( 01/21/2020)    Time  4    Period  Weeks    Status  New    Target Date  01/21/20            Plan - 12/30/19 1427    Clinical Impression Statement  Pt ~ 11 minutes late for today's session. She reports improvement overall. Pt tolerated an initial progression to TE well. No tenderness noted in R gastroc. Pt had good ROM with the LE exercises. Tactile cues needed to keep shoulders down with rows. RUE appeared to be weaker than L with rows and lats.    Comorbidities  multiple CVAs, HTN, bilat bunionectomy, cervical DJD, arthritis and GERD    Examination-Participation Restrictions  Cleaning;Community Activity;Shop    Stability/Clinical Decision Making  Evolving/Moderate complexity    Rehab  Potential  Good    PT Frequency  2x / week    PT Duration  4 weeks    PT Treatment/Interventions  Taping;Vasopneumatic Device;Functional mobility training;Patient/family education;Moist Heat;Ultrasound;Passive range of motion;Therapeutic exercise;Cryotherapy;Electrical Stimulation;Neuromuscular re-education;Manual techniques;Dry needling    PT Next Visit Plan  manual work to [employer] and peroneals, LE and core strengthening and proprioception       Patient will benefit from skilled therapeutic intervention in order to improve the following deficits and impairments:  Difficulty walking, Obesity, Pain, Decreased strength, Increased muscle spasms  Visit Diagnosis: Difficulty in walking, not elsewhere classified  Muscle weakness (generalized)  Pain in right lower leg     Problem List Patient Active Problem List   Diagnosis Date Noted  . Painful legs and moving toes of right foot 12/16/2019  . Skin nodule 11/16/2019  . Nocturia 11/16/2019  . Low back pain 11/16/2019  . Alopecia 01/31/2019  . Upper airway cough syndrome 06/14/2018  . Rash 05/30/2018  . Vitamin D deficiency 08/31/2017  . Facial contusion, subsequent encounter 03/12/2017  . Vertigo 03/12/2017  . Right rotator cuff tear 07/27/2016  . Right shoulder pain 07/11/2016  . Overactive bladder 01/19/2016  . Cough variant asthma 09/07/2015  . Burn 09/06/2015  . Vocal cord dysfunction 04/05/2015  . Asthma with exacerbation 04/05/2015  . Left knee pain 03/12/2015  . Right knee pain 03/12/2015  . Cough 12/08/2014  . Skin lumps, generalized 09/08/2014  . Toe pain, right 08/06/2014  . Pre-ulcerative corn or callous 08/06/2014  . Hammertoe 08/06/2014  . Swelling of joint, ankle, right 08/06/2014  . Pansinusitis 06/02/2014  . Cerebral thrombosis with cerebral infarction ([place]) 05/26/2014  . Weakness 05/25/2014  . Right arm pain 05/25/2014  . Numbness in right leg 05/25/2014  . Numbness and tingling of right arm  05/25/2014  . Embolic  stroke ([place]) 03/31/2014  . 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 Alterations of sensations, late effect of cerebrovascular disease(438.6) 02/10/2014  . Cerebral infarction ([place]) 11/25/2013  . Hyperlipidemia 11/24/2013  . Syncope 11/20/2013  . 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 Back pain 11/06/2013  . Hypokalemia 05/06/2013  . Family history of colon cancer 05/06/2013  . Lipoma 09/03/2012  . Hyperglycemia 08/15/2012  . Constipation 01/02/2012  . Preventative health care 05/24/2011  . 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 HYPERSOMNIA WITH SLEEP APNEA UNSPECIFIED 01/26/2011  . UTI (urinary tract infection) 11/15/2010  . NASAL POLYP 07/28/2010  . FIBROIDS, UTERUS 07/27/2010  . COMPUTERIZED TOMOGRAPHY, CHEST, ABNORMAL 09/16/2009  . Seasonal and perennial allergic rhinitis 05/29/2009  . PERIPHERAL EDEMA 05/28/2009  . Mild persistent chronic asthma without complication [id number]  . LEG PAIN, LEFT 06/30/2008  . COLONIC POLYPS, HX OF 06/30/2008  . OBESITY 12/12/2007  . Depression 12/12/2007  . Essential hypertension 12/12/2007  . RHINOSINUSITIS, CHRONIC 12/12/2007  . GERD 12/12/2007  . ARTHRITIS 12/12/2007    Scot Jun, PTA 12/30/2019, 2:30 PM  [address] [place], [place], [address] Phone: [phone]   Fax:  [phone]  Name: Circe Iwai MRN: [id number] Date of Birth: Dec 24, 1950

## 2019-12-31 ENCOUNTER — Ambulatory Visit (INDEPENDENT_AMBULATORY_CARE_PROVIDER_SITE_OTHER): Payer: PPO | Admitting: Podiatry

## 2019-12-31 ENCOUNTER — Ambulatory Visit: Payer: PPO | Admitting: Internal Medicine

## 2019-12-31 ENCOUNTER — Ambulatory Visit (INDEPENDENT_AMBULATORY_CARE_PROVIDER_SITE_OTHER): Payer: PPO | Admitting: Internal Medicine

## 2019-12-31 ENCOUNTER — Encounter: Payer: Self-pay | Admitting: Internal Medicine

## 2019-12-31 VITALS — BP 122/80 | HR 80 | Temp 98.6°F | Ht 62.0 in | Wt 194.8 lb

## 2019-12-31 DIAGNOSIS — K219 Gastro-esophageal reflux disease without esophagitis: Secondary | ICD-10-CM | POA: Diagnosis not present

## 2019-12-31 DIAGNOSIS — N183 Chronic kidney disease, stage 3 unspecified: Secondary | ICD-10-CM | POA: Diagnosis not present

## 2019-12-31 DIAGNOSIS — R55 Syncope and collapse: Secondary | ICD-10-CM | POA: Diagnosis not present

## 2019-12-31 DIAGNOSIS — L989 Disorder of the skin and subcutaneous tissue, unspecified: Secondary | ICD-10-CM

## 2019-12-31 DIAGNOSIS — B351 Tinea unguium: Secondary | ICD-10-CM | POA: Diagnosis not present

## 2019-12-31 DIAGNOSIS — R739 Hyperglycemia, unspecified: Secondary | ICD-10-CM

## 2019-12-31 DIAGNOSIS — M79676 Pain in unspecified toe(s): Secondary | ICD-10-CM

## 2019-12-31 MED ORDER — FAMOTIDINE 20 MG PO TABS: 20.0000 mg | ORAL_TABLET | Freq: Every day | ORAL | 3 refills | Status: DC

## 2019-12-31 NOTE — Progress Notes (Signed)
Subjective:    Patient ID: Andrea Mason, female    DOB: 17-Jul-1951, 69 y.o.   MRN: [id number]  HPI  Here after recen ED eval with episode spell for unclear reasons of near syncope; Pt denies chest pain, increased sob or doe, wheezing, orthopnea, PND, increased LE swelling, palpitations, Denies worsening  abd pain, dysphagia, n/v, bowel change or blood, but has worsening reflux and gas with belching and bloating.  Pt denies new neurological symptoms such as new headache, or facial or extremity weakness or numbness   Pt denies polydipsia, polyuria.  Due to f/u Renal per pt, cant recall when last visit was but may have missed 2020 Past Medical History:  Diagnosis Date  . Allergic rhinitis   . Allergy   . Asthma   . Depression   . DJD (degenerative joint disease), cervical   . GERD (gastroesophageal reflux disease)   . History of colonic polyps   . Hypertension   . Obesity   . 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 Stroke ([place])   . Syncope    Past Surgical History:  Procedure Laterality Date  . COLONOSCOPY    . FOOT SURGERY  1998  . 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 LOOP RECORDER REMOVAL N/A 10/02/2017   Procedure: LOOP RECORDER REMOVAL;  Surgeon: [name], MD;  Location: [place] [facility];  Service: Cardiovascular;  Laterality: N/A;  . NASAL POLYP SURGERY  07/2006   x 2 with Dr. [name]  . NASAL SINUS SURGERY  07/2006   Dr. [name]  . TUBAL LIGATION      reports that she has never smoked. She has never used smokeless tobacco. She reports current alcohol use. She reports that she does not use drugs. family history includes Arrhythmia in her brother; Cancer in her mother; Colon cancer in her brother; Congenital heart disease in her brother; Diabetes in her sister; Heart attack in her brother; Lung disease in her son; Stroke in her brother. Allergies  Allergen Reactions  .  Tramadol Other (See Comments)    Almost passed out  . Ivp Dye [Iodinated Diagnostic Agents] Nausea And Vomiting and Other (See Comments)    Reaction: hot flashes  . Promethazine-Codeine Nausea And Vomiting   Current Outpatient Medications on File Prior to Visit  Medication Sig Dispense Refill  . aspirin 325 MG tablet Take 325 mg by mouth daily.    . Biotin 10000 MCG TABS Take 10,000 mcg by mouth daily.    . cetirizine (ZYRTEC ALLERGY) 10 MG tablet Take 10 mg by mouth at bedtime.     . Cholecalciferol (VITAMIN D3) 2000 units TABS Take 2,000 Units by mouth daily.    Marland Kitchen dextromethorphan-guaiFENesin (MUCINEX DM) 30-600 MG 12hr tablet Take 1-2 tablets by mouth 2 (two) times daily as needed (for congestion/cough (WITH FLUTTER VALVE)).     . fesoterodine (TOVIAZ) 8 MG TB24 tablet Take 1 tablet (8 mg total) by mouth daily. 90 tablet 3  . mometasone-formoterol (DULERA) 100-5 MCG/ACT AERO Inhale 2 puffs into the lungs 2 (two) times daily. 1 Inhaler 3  . montelukast (SINGULAIR) 10 MG tablet Take 1 tablet (10 mg total) by mouth at bedtime. 30 tablet 4  . oxymetazoline (AFRIN) 0.05 % nasal spray Place 2 sprays into both nostrils 2 (two) times daily as needed (nasal stuffiness and nasal pressure for 5 days).     . pantoprazole (PROTONIX) 40 MG tablet  Take 1 tablet (40 mg total) by mouth daily. 90 tablet 1  . Pitavastatin Calcium (LIVALO) 4 MG TABS Take 1 tablet (4 mg total) by mouth every other day. 90 tablet 0  . sodium chloride (OCEAN) 0.65 % SOLN nasal spray Place 2 sprays into both nostrils as needed (dry nose).     Marland Kitchen spironolactone (ALDACTONE) 25 MG tablet Take 25 mg by mouth daily.     Marland Kitchen triamcinolone (NASACORT) 55 MCG/ACT AERO nasal inhaler Place 2 sprays into the nose daily. 1 Inhaler 12  . triamcinolone cream (KENALOG) 0.1 % Apply 1 application topically 2 (two) times daily as needed. (Patient taking differently: Apply 1 application topically 2 (two) times daily as needed (for rash). ) 45 g 1   No  current facility-administered medications on file prior to visit.   Review of Systems All otherwise neg per pt     Objective:   Physical Exam BP 122/80   Pulse 80   Temp 98.6 F (37 C)   Ht 5\' 2"  (1.575 m)   Wt 194 lb 12.8 oz (88.4 kg)   LMP  (LMP Unknown)   SpO2 99%   BMI 35.63 kg/m  VS noted,  Constitutional: Pt appears in NAD HENT: Head: NCAT.  Right Ear: External ear normal.  Left Ear: External ear normal.  Eyes: . Pupils are equal, round, and reactive to light. Conjunctivae and EOM are normal Nose: without d/c or deformity Neck: Neck supple. Gross normal ROM Cardiovascular: Normal rate and regular rhythm.   Pulmonary/Chest: Effort normal and breath sounds without rales or wheezing.  Abd:  Soft, NT, ND, + BS, no organomegaly Neurological: Pt is alert. At baseline orientation, motor grossly intact Skin: Skin is warm. No rashes, other new lesions, no LE edema Psychiatric: Pt behavior is normal without agitation  All otherwise neg per pt Lab Results  Component Value Date   WBC 9.9 12/26/2019   HGB 13.3 12/26/2019   HCT 39.0 12/26/2019   PLT 236 12/26/2019   GLUCOSE 97 12/26/2019   CHOL 79 06/03/2019   TRIG 72.0 06/03/2019   HDL 55.60 06/03/2019   LDLCALC 9 06/03/2019   ALT 18 12/26/2019   AST 18 12/26/2019   NA 138 12/26/2019   K 4.3 12/26/2019   CL 107 12/26/2019   CREATININE 1.50 (H) 12/26/2019   BUN 40 (H) 12/26/2019   CO2 24 12/26/2019   TSH 1.05 06/03/2019   INR 1.0 12/26/2019   HGBA1C 5.9 06/03/2019      Assessment & Plan:

## 2019-12-31 NOTE — Assessment & Plan Note (Signed)
Uncontrolled, to add pepcid 20 qhs, and refer GI per pt request

## 2019-12-31 NOTE — Assessment & Plan Note (Signed)
stable overall by history and exam, recent data reviewed with pt, and pt to continue medical treatment as before,  to f/u any worsening symptoms or concerns  

## 2019-12-31 NOTE — Assessment & Plan Note (Signed)
delcines f/u lab today, to f/u renal  - pt states will call for appt

## 2019-12-31 NOTE — Patient Instructions (Addendum)
OK to stop the mobic (meloxicam) in case it affects the kidneys  Please make sure to follow up with Dr Joelyn Oms, as I think it may have been over a year  Please take all new medication as prescribed - the pepcid 20 mg at bedtime  You will be contacted regarding the referral for: Gastroenterology, and Stress Testing  Please continue all other medications as before, and refills have been done if requested.  Please have the pharmacy call with any other refills you may need.  Please continue your efforts at being more active, low cholesterol diet, and weight control.  Please keep your appointments with your specialists as you may have planned  Please make an Appointment to return in 3 months, or sooner if needed

## 2019-12-31 NOTE — Assessment & Plan Note (Addendum)
Also for stress testing r/o anginal equivalent,  to f/u any worsening symptoms or concerns  I spent 32 minutes preparing to see the patient by review of recent labs, imaging and procedures, obtaining and reviewing separately obtained history, communicating with the patient and family or caregiver, ordering medications, tests or procedures, and documenting clinical information in the EHR including the differential Dx, treatment, and any further evaluation and other management of syncope, GERD, CKD, hyperglycemia

## 2020-01-01 ENCOUNTER — Encounter: Payer: Self-pay | Admitting: Physical Therapy

## 2020-01-01 ENCOUNTER — Ambulatory Visit: Payer: PPO | Admitting: Physical Therapy

## 2020-01-01 ENCOUNTER — Other Ambulatory Visit: Payer: Self-pay

## 2020-01-01 DIAGNOSIS — M6281 Muscle weakness (generalized): Secondary | ICD-10-CM

## 2020-01-01 DIAGNOSIS — R262 Difficulty in walking, not elsewhere classified: Secondary | ICD-10-CM

## 2020-01-01 DIAGNOSIS — M79661 Pain in right lower leg: Secondary | ICD-10-CM | POA: Diagnosis not present

## 2020-01-01 NOTE — Therapy (Signed)
[address] [place], [place], [address] Phone: [phone]   Fax:  [phone]  Physical Therapy Treatment  Patient Details  Name: Andrea Mason MRN: [id number] Date of Birth: [birth year] Referring Provider (PT): Dr Lynne Leader   Encounter Date: 01/01/2020  PT End of Session - 01/01/20 1644    Visit Number  3    Date for PT Re-Evaluation  01/21/20    Authorization Type  health team advantage    PT Start Time  1600    PT Stop Time  1644    PT Time Calculation (min)  44 min    Activity Tolerance  Patient tolerated treatment well    Behavior During Therapy  [facility] for tasks assessed/performed       Past Medical History:  Diagnosis Date  . Allergic rhinitis   . Allergy   . Asthma   . Depression   . DJD (degenerative joint disease), cervical   . GERD (gastroesophageal reflux disease)   . History of colonic polyps   . Hypertension   . Obesity   . 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 Stroke ([place])   . Syncope     Past Surgical History:  Procedure Laterality Date  . COLONOSCOPY    . FOOT SURGERY  1998  . 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 LOOP RECORDER REMOVAL N/A 10/02/2017   Procedure: LOOP RECORDER REMOVAL;  Surgeon: [name], MD;  Location: [place] [facility];  Service: Cardiovascular;  Laterality: N/A;  . NASAL POLYP SURGERY  07/2006   x 2 with Dr. [name]  . NASAL SINUS SURGERY  07/2006   Dr. [name]  . TUBAL LIGATION      There were no vitals filed for this visit.  Subjective Assessment - 01/01/20 1603    Subjective  "I am doing ok"    Pertinent History  bilat bunuinectomy, HTN, h/ CVA 2014,  DJD cervical, GERD    Currently in Pain?  No/denies                       [facility] Adult PT Treatment/Exercise - 01/01/20 0001      Knee/Hip Exercises: Aerobic   Recumbent Bike   L1 x 4 min     Nustep  L4 x6 min       Knee/Hip Exercises: Machines for Strengthening   Cybex Knee Extension  5lb 2x10     Cybex Knee Flexion  20lb 2x10     Cybex Leg Press  20lb 2x10     Other Machine  Rows & Lats 20lb 2x10       Knee/Hip Exercises: Seated   Ball Squeeze  2x10                  PT Long Term Goals - 12/24/19 1013      PT LONG TERM GOAL #1   Title  I with advanced HEP to include a walking program ( 01/21/2020)    Time  4    Period  Weeks    Status  New    Target Date  01/21/20      PT LONG TERM GOAL #2   Title  improve Rt hip and ankle strength =/> 4+/5 throughout to allow her to perform SLS =/> 10 sec ( 01/21/2020)    Time  4    Period  Weeks    Status  New    Target Date  01/21/20      PT LONG TERM GOAL #3   Title  improve LEFS =/> 71/80, 88.8% ( 01/21/2020)    Time  4    Period  Weeks    Status  New    Target Date  01/21/20      PT LONG TERM GOAL #4   Title  increase bilat ankle DF =/> 12 degrees ( 01/21/2020)    Time  4    Period  Weeks    Status  New    Target Date  01/21/20      PT LONG TERM GOAL #5   Title  report =/> 75% reduction of pain with prolonged walking/standing ( 01/21/2020)    Time  4    Period  Weeks    Status  New    Target Date  01/21/20            Plan - 01/01/20 1645    Clinical Impression Statement  Pt tolerated a progressed treatment well. Added additional LE machine level interventions without issue. Cues needed to complete the full ROM with the exercises. Tactile cues for posture provided with seated rows.    Comorbidities  multiple CVAs, HTN, bilat bunionectomy, cervical DJD, arthritis and GERD    Examination-Activity Limitations  Other;Stand    Examination-Participation Restrictions  Cleaning;Community Activity;Shop    Stability/Clinical Decision Making  Evolving/Moderate complexity    Rehab Potential  Good    PT Frequency  2x / week    PT Duration  4 weeks    PT Treatment/Interventions   Taping;Vasopneumatic Device;Functional mobility training;Patient/family education;Moist Heat;Ultrasound;Passive range of motion;Therapeutic exercise;Cryotherapy;Electrical Stimulation;Neuromuscular re-education;Manual techniques;Dry needling    PT Next Visit Plan  manual work to [employer] and peroneals, LE and core strengthening and proprioception       Patient will benefit from skilled therapeutic intervention in order to improve the following deficits and impairments:  Difficulty walking, Obesity, Pain, Decreased strength, Increased muscle spasms  Visit Diagnosis: Difficulty in walking, not elsewhere classified  Pain in right lower leg  Muscle weakness (generalized)     Problem List Patient Active Problem List   Diagnosis Date Noted  . CKD (chronic kidney disease) stage 3, GFR 30-59 ml/min 12/31/2019  . Painful legs and moving toes of right foot 12/16/2019  . Skin nodule 11/16/2019  . Nocturia 11/16/2019  . Low back pain 11/16/2019  . Alopecia 01/31/2019  . Upper airway cough syndrome 06/14/2018  . Rash 05/30/2018  . Vitamin D deficiency 08/31/2017  . Facial contusion, subsequent encounter 03/12/2017  . Vertigo 03/12/2017  . Right rotator cuff tear 07/27/2016  . Right shoulder pain 07/11/2016  . Overactive bladder 01/19/2016  . Cough variant asthma 09/07/2015  . Burn 09/06/2015  . Vocal cord dysfunction 04/05/2015  . Asthma with exacerbation 04/05/2015  . Left knee pain 03/12/2015  . Right knee pain 03/12/2015  . Cough 12/08/2014  . Skin lumps, generalized 09/08/2014  . Toe pain, right 08/06/2014  . Pre-ulcerative corn or callous 08/06/2014  . Hammertoe 08/06/2014  . Swelling of joint, ankle, right 08/06/2014  . Pansinusitis 06/02/2014  . Cerebral thrombosis with cerebral infarction ([place]) 05/26/2014  . Weakness 05/25/2014  . Right arm pain 05/25/2014  . Numbness in right leg 05/25/2014  . Numbness and tingling of right arm 05/25/2014  . Embolic stroke ([place])  99991111  . 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 Alterations of  sensations, late effect of cerebrovascular disease(438.6) 02/10/2014  . Cerebral infarction ([place]) 11/25/2013  . Hyperlipidemia 11/24/2013  . Syncope 11/20/2013  . 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 Back pain 11/06/2013  . Hypokalemia 05/06/2013  . Family history of colon cancer 05/06/2013  . Lipoma 09/03/2012  . Hyperglycemia 08/15/2012  . Constipation 01/02/2012  . Preventative health care 05/24/2011  . 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 HYPERSOMNIA WITH SLEEP APNEA UNSPECIFIED 01/26/2011  . UTI (urinary tract infection) 11/15/2010  . NASAL POLYP 07/28/2010  . FIBROIDS, UTERUS 07/27/2010  . COMPUTERIZED TOMOGRAPHY, CHEST, ABNORMAL 09/16/2009  . Seasonal and perennial allergic rhinitis 05/29/2009  . PERIPHERAL EDEMA 05/28/2009  . Mild persistent chronic asthma without complication A999333  . LEG PAIN, LEFT 06/30/2008  . COLONIC POLYPS, HX OF 06/30/2008  . OBESITY 12/12/2007  . Depression 12/12/2007  . Essential hypertension 12/12/2007  . RHINOSINUSITIS, CHRONIC 12/12/2007  . GERD 12/12/2007  . ARTHRITIS 12/12/2007    Scot Jun, PTA 01/01/2020, 4:48 PM  [address] [place], [place], [address] Phone: [phone]   Fax:  [phone]  Name: Eirene Llera MRN: [id number] Date of Birth: 07-10-1951

## 2020-01-03 NOTE — Progress Notes (Signed)
    Subjective: Patient is a 69 y.o. female presenting to the office today for follow up evaluation of painful callus lesion(s) noted to the bilateral feet. Walking and bearing weight increases the pain. She has not had any recent treatment for the symptoms.  Patient also complains of elongated, thickened nails that cause pain while ambulating in shoes. She is unable to trim her own nails. Patient presents today for further treatment and evaluation.  Past Medical History:  Diagnosis Date  . Allergic rhinitis   . Allergy   . Asthma   . Depression   . DJD (degenerative joint disease), cervical   . GERD (gastroesophageal reflux disease)   . History of colonic polyps   . Hypertension   . Obesity   . 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 Stroke ([place])   . Syncope     Objective:  Physical Exam General: Alert and oriented x3 in no acute distress  Dermatology: Hyperkeratotic lesion(s) present on the bilateral feet. Pain on palpation with a central nucleated core noted. Skin is warm, dry and supple bilateral lower extremities. Negative for open lesions or macerations. Nails are tender, long, thickened and dystrophic with subungual debris, consistent with onychomycosis, 1-5 bilateral. No signs of infection noted.  Vascular: Palpable pedal pulses bilaterally. No edema or erythema noted. Capillary refill within normal limits.  Neurological: Epicritic and protective threshold grossly intact bilaterally.   Musculoskeletal Exam: Pain on palpation at the keratotic lesion(s) noted. Range of motion within normal limits bilateral. Muscle strength 5/5 in all groups bilateral.  Assessment: 1. Onychodystrophic nails 1-5 bilateral with hyperkeratosis of nails.  2. Onychomycosis of nail due to dermatophyte bilateral 3. Pre-ulcerative callus lesions noted to the bilateral feet x 4   Plan of Care:  1. Patient evaluated. 2. Excisional debridement of keratoic lesion(s) using a chisel blade was  performed without incident.  3. Dressed with light dressing. 4. Mechanical debridement of nails 1-5 bilaterally performed using a nail nipper. Filed with dremel without incident.  5. Patient is to return to the clinic in 3 months.   Edrick Kins, DPM [facility]  Dr. Edrick Kins, [address]                                        [place], [place] [address]                Office [phone]  Fax [phone]

## 2020-01-06 ENCOUNTER — Encounter: Payer: Self-pay | Admitting: Physical Therapy

## 2020-01-06 ENCOUNTER — Ambulatory Visit: Payer: PPO | Attending: Family Medicine | Admitting: Physical Therapy

## 2020-01-06 ENCOUNTER — Other Ambulatory Visit: Payer: Self-pay

## 2020-01-06 DIAGNOSIS — M79661 Pain in right lower leg: Secondary | ICD-10-CM | POA: Insufficient documentation

## 2020-01-06 DIAGNOSIS — R262 Difficulty in walking, not elsewhere classified: Secondary | ICD-10-CM | POA: Diagnosis not present

## 2020-01-06 DIAGNOSIS — M6281 Muscle weakness (generalized): Secondary | ICD-10-CM | POA: Insufficient documentation

## 2020-01-06 NOTE — Therapy (Signed)
[place], [place], [address] Phone: [phone]   Fax:  [phone]  Physical Therapy Treatment  Patient Details  Name: Andrea Mason MRN: [id number] Date of Birth: 12/23/1950 Referring Provider (PT): Dr Lynne Leader   Encounter Date: 01/06/2020    Past Medical History:  Diagnosis Date  . Allergic rhinitis   . Allergy   . Asthma   . Depression   . DJD (degenerative joint disease), cervical   . GERD (gastroesophageal reflux disease)   . History of colonic polyps   . Hypertension   . Obesity   . 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 Stroke ([place])   . Syncope     Past Surgical History:  Procedure Laterality Date  . COLONOSCOPY    . FOOT SURGERY  1998  . 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 LOOP RECORDER REMOVAL N/A 10/02/2017   Procedure: LOOP RECORDER REMOVAL;  Surgeon: [name], MD;  Location: [place] [facility];  Service: Cardiovascular;  Laterality: N/A;  . NASAL POLYP SURGERY  07/2006   x 2 with Dr. [name]  . NASAL SINUS SURGERY  07/2006   Dr. [name]  . TUBAL LIGATION      There were no vitals filed for this visit.  Subjective Assessment - 01/06/20 1514    Subjective  "i am doing good"    Currently in Pain?  No/denies         [facility] PT Assessment - 01/06/20 0001      Squat   Comments  WNL                   [facility] Adult PT Treatment/Exercise - 01/06/20 0001      Knee/Hip Exercises: Aerobic   Recumbent Bike  L1 x 3 min     Nustep  L4 x6 min       Knee/Hip Exercises: Machines for Strengthening   Cybex Knee Flexion  20lb 2x10     Cybex Leg Press  20lb 3x10       Knee/Hip Exercises: Standing   Forward Step Up  Both;1 set;10 reps;Hand Hold: 0;Step Height: 6"    Walking with Sports Cord  30lb forward/backwards x 3 each      Knee/Hip Exercises: Seated   Ball Squeeze  2x10                  PT Long Term Goals - 01/06/20 1552      PT LONG TERM GOAL #1   Title  I with advanced HEP to include a walking program ( 01/21/2020)    Status  On-going      PT LONG TERM GOAL #2   Title  improve Rt hip and ankle strength =/> 4+/5 throughout to allow her to perform SLS =/> 10 sec ( 01/21/2020)    Status  On-going      PT LONG TERM GOAL #3   Title  improve LEFS =/> 71/80, 88.8% ( 01/21/2020)    Status  On-going      PT LONG TERM GOAL #5   Title  report =/> 75% reduction of pain with prolonged walking/standing ( 01/21/2020)    Status  Partially Met            Plan - 01/06/20 1553    Clinical Impression Statement  Pt is progressing overall. She reports no functional limitations  at home. She report that her pain overall has improved. Cues to take bigger steps with resisted gait. Pt was very confused with step ups often mixing up which leg to step up with.    Personal Factors and Comorbidities  Comorbidity 3+    Comorbidities  multiple CVAs, HTN, bilat bunionectomy, cervical DJD, arthritis and GERD    Examination-Activity Limitations  Other;Stand    Examination-Participation Restrictions  Cleaning;Community Activity;Shop    Stability/Clinical Decision Making  Evolving/Moderate complexity    Rehab Potential  Good    PT Frequency  2x / week    PT Duration  4 weeks    PT Treatment/Interventions  Taping;Vasopneumatic Device;Functional mobility training;Patient/family education;Moist Heat;Ultrasound;Passive range of motion;Therapeutic exercise;Cryotherapy;Electrical Stimulation;Neuromuscular re-education;Manual techniques;Dry needling    PT Next Visit Plan  manual work to [employer] and peroneals, LE and core strengthening and proprioception       Patient will benefit from skilled therapeutic intervention in order to improve the following deficits and impairments:  Difficulty walking, Obesity, Pain, Decreased strength, Increased muscle  spasms  Visit Diagnosis: Difficulty in walking, not elsewhere classified  Pain in right lower leg  Muscle weakness (generalized)     Problem List Patient Active Problem List   Diagnosis Date Noted  . CKD (chronic kidney disease) stage 3, GFR 30-59 ml/min 12/31/2019  . Painful legs and moving toes of right foot 12/16/2019  . Skin nodule 11/16/2019  . Nocturia 11/16/2019  . Low back pain 11/16/2019  . Alopecia 01/31/2019  . Upper airway cough syndrome 06/14/2018  . Rash 05/30/2018  . Vitamin D deficiency 08/31/2017  . Facial contusion, subsequent encounter 03/12/2017  . Vertigo 03/12/2017  . Right rotator cuff tear 07/27/2016  . Right shoulder pain 07/11/2016  . Overactive bladder 01/19/2016  . Cough variant asthma 09/07/2015  . Burn 09/06/2015  . Vocal cord dysfunction 04/05/2015  . Asthma with exacerbation 04/05/2015  . Left knee pain 03/12/2015  . Right knee pain 03/12/2015  . Cough 12/08/2014  . Skin lumps, generalized 09/08/2014  . Toe pain, right 08/06/2014  . Pre-ulcerative corn or callous 08/06/2014  . Hammertoe 08/06/2014  . Swelling of joint, ankle, right 08/06/2014  . Pansinusitis 06/02/2014  . Cerebral thrombosis with cerebral infarction ([place]) 05/26/2014  . Weakness 05/25/2014  . Right arm pain 05/25/2014  . Numbness in right leg 05/25/2014  . Numbness and tingling of right arm 05/25/2014  . Embolic stroke ([place]) 46/80/3212  . 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 Alterations of sensations, late effect of cerebrovascular disease(438.6) 02/10/2014  . Cerebral infarction ([place]) 11/25/2013  . Hyperlipidemia 11/24/2013  . Syncope 11/20/2013  . 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 Back pain 11/06/2013  . Hypokalemia 05/06/2013  . Family history of colon cancer 05/06/2013  . Lipoma 09/03/2012  . Hyperglycemia 08/15/2012  . Constipation 01/02/2012  . Preventative health care 05/24/2011  . OSTEOARTHRITIS, CERVICAL SPINE  02/09/2011  . HYPERSOMNIA WITH SLEEP APNEA UNSPECIFIED 01/26/2011  . UTI (urinary tract infection) 11/15/2010  . NASAL POLYP 07/28/2010  . FIBROIDS, UTERUS 07/27/2010  . COMPUTERIZED TOMOGRAPHY, CHEST, ABNORMAL 09/16/2009  . Seasonal and perennial allergic rhinitis 05/29/2009  . PERIPHERAL EDEMA 05/28/2009  . Mild persistent chronic asthma without complication 24/82/5003  . LEG PAIN, LEFT 06/30/2008  . COLONIC POLYPS, HX OF 06/30/2008  . OBESITY 12/12/2007  . Depression 12/12/2007  . Essential hypertension 12/12/2007  . RHINOSINUSITIS, CHRONIC 12/12/2007  . GERD 12/12/2007  . ARTHRITIS 12/12/2007    Scot Jun, PTA  01/06/2020, 3:56 PM  [place], [place], [address] Phone: [phone]   Fax:  [phone]  Name: Tamakia Porto MRN: [id number] Date of Birth: [birth year]

## 2020-01-08 ENCOUNTER — Other Ambulatory Visit: Payer: Self-pay

## 2020-01-08 ENCOUNTER — Telehealth (HOSPITAL_COMMUNITY): Payer: Self-pay

## 2020-01-08 ENCOUNTER — Ambulatory Visit: Payer: PPO | Admitting: Physical Therapy

## 2020-01-08 ENCOUNTER — Encounter: Payer: Self-pay | Admitting: Physical Therapy

## 2020-01-08 DIAGNOSIS — R262 Difficulty in walking, not elsewhere classified: Secondary | ICD-10-CM | POA: Diagnosis not present

## 2020-01-08 DIAGNOSIS — M6281 Muscle weakness (generalized): Secondary | ICD-10-CM

## 2020-01-08 DIAGNOSIS — M79661 Pain in right lower leg: Secondary | ICD-10-CM

## 2020-01-08 NOTE — Telephone Encounter (Signed)
Spoke with the patient, instructions given. She stated that she would be here for her test. Asked to call back with any questions. S.Syniyah Bourne EMTP 

## 2020-01-08 NOTE — Therapy (Signed)
[place], [place], [address] Phone: [phone]   Fax:  [phone]  Physical Therapy Treatment  Patient Details  Name: Andrea Mason MRN: [id number] Date of Birth: [birth year] Referring Provider (PT): Dr Lynne Leader   Encounter Date: 01/08/2020  PT End of Session - 01/08/20 1608    Visit Number  4    Date for PT Re-Evaluation  01/21/20    Authorization Type  health team advantage    PT Start Time  1515    PT Stop Time  1600    PT Time Calculation (min)  45 min    Activity Tolerance  Patient tolerated treatment well    Behavior During Therapy  [facility] for tasks assessed/performed       Past Medical History:  Diagnosis Date  . Allergic rhinitis   . Allergy   . Asthma   . Depression   . DJD (degenerative joint disease), cervical   . GERD (gastroesophageal reflux disease)   . History of colonic polyps   . Hypertension   . Obesity   . 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 Stroke ([place])   . Syncope     Past Surgical History:  Procedure Laterality Date  . COLONOSCOPY    . FOOT SURGERY  1998  . 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 LOOP RECORDER REMOVAL N/A 10/02/2017   Procedure: LOOP RECORDER REMOVAL;  Surgeon: [name], MD;  Location: [place] [facility];  Service: Cardiovascular;  Laterality: N/A;  . NASAL POLYP SURGERY  07/2006   x 2 with Dr. [name]  . NASAL SINUS SURGERY  07/2006   Dr. [name]  . TUBAL LIGATION      There were no vitals filed for this visit.  Subjective Assessment - 01/08/20 1517    Subjective  "Im tired"    Currently in Pain?  No/denies                       [facility] Adult PT Treatment/Exercise - 01/08/20 0001      Knee/Hip Exercises: Aerobic   Recumbent Bike  L1 x 3 min     Nustep  L4 x7 min       Knee/Hip Exercises: Machines for Strengthening    Cybex Knee Extension  10lb 2x10; RLE 5lb 3x5    Cybex Knee Flexion  20lb 2x10; RLE 19lb 2x5      Cybex Leg Press  30lb 2x10, RLE 20lb 2x10                    PT Long Term Goals - 01/08/20 1609      PT LONG TERM GOAL #1   Title  I with advanced HEP to include a walking program ( 01/21/2020)    Status  On-going      PT LONG TERM GOAL #2   Title  improve Rt hip and ankle strength =/> 4+/5 throughout to allow her to perform SLS =/> 10 sec ( 01/21/2020)    Status  Partially Met      PT LONG TERM GOAL #3   Title  improve LEFS =/> 71/80, 88.8% ( 01/21/2020)            Plan - 01/08/20 1609    Clinical Impression Statement  Pain reports that she still has some distal REL pain in the morning  with just standing. She also reports that the has to focus on her RLE to lock it out when standing. Increase resistance tolerated with all interventions. Progressed to some RLE strengthening with some difficulty with activation. Pt reports that's he has to really focus to use the RLE only. Increase time needed due the fatigue and effort with SL interventions.    Comorbidities  multiple CVAs, HTN, bilat bunionectomy, cervical DJD, arthritis and GERD    Examination-Activity Limitations  Other;Stand    Examination-Participation Restrictions  Cleaning;Community Activity;Shop    Stability/Clinical Decision Making  Evolving/Moderate complexity    Rehab Potential  Good    PT Frequency  2x / week    PT Treatment/Interventions  Taping;Vasopneumatic Device;Functional mobility training;Patient/family education;Moist Heat;Ultrasound;Passive range of motion;Therapeutic exercise;Cryotherapy;Electrical Stimulation;Neuromuscular re-education;Manual techniques;Dry needling    PT Next Visit Plan  manual work to [employer] and peroneals, LE and core strengthening and proprioception       Patient will benefit from skilled therapeutic intervention in order to improve the following deficits and impairments:   Difficulty walking, Obesity, Pain, Decreased strength, Increased muscle spasms  Visit Diagnosis: Pain in right lower leg  Difficulty in walking, not elsewhere classified  Muscle weakness (generalized)     Problem List Patient Active Problem List   Diagnosis Date Noted  . CKD (chronic kidney disease) stage 3, GFR 30-59 ml/min 12/31/2019  . Painful legs and moving toes of right foot 12/16/2019  . Skin nodule 11/16/2019  . Nocturia 11/16/2019  . Low back pain 11/16/2019  . Alopecia 01/31/2019  . Upper airway cough syndrome 06/14/2018  . Rash 05/30/2018  . Vitamin D deficiency 08/31/2017  . Facial contusion, subsequent encounter 03/12/2017  . Vertigo 03/12/2017  . Right rotator cuff tear 07/27/2016  . Right shoulder pain 07/11/2016  . Overactive bladder 01/19/2016  . Cough variant asthma 09/07/2015  . Burn 09/06/2015  . Vocal cord dysfunction 04/05/2015  . Asthma with exacerbation 04/05/2015  . Left knee pain 03/12/2015  . Right knee pain 03/12/2015  . Cough 12/08/2014  . Skin lumps, generalized 09/08/2014  . Toe pain, right 08/06/2014  . Pre-ulcerative corn or callous 08/06/2014  . Hammertoe 08/06/2014  . Swelling of joint, ankle, right 08/06/2014  . Pansinusitis 06/02/2014  . Cerebral thrombosis with cerebral infarction ([place]) 05/26/2014  . Weakness 05/25/2014  . Right arm pain 05/25/2014  . Numbness in right leg 05/25/2014  . Numbness and tingling of right arm 05/25/2014  . Embolic stroke ([place]) 95/18/8416  . 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 Alterations of sensations, late effect of cerebrovascular disease(438.6) 02/10/2014  . Cerebral infarction ([place]) 11/25/2013  . Hyperlipidemia 11/24/2013  . Syncope 11/20/2013  . 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 Back pain 11/06/2013  . Hypokalemia 05/06/2013  . Family history of colon cancer 05/06/2013  . Lipoma 09/03/2012  . Hyperglycemia 08/15/2012  . Constipation 01/02/2012   . Preventative health care 05/24/2011  . 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 HYPERSOMNIA WITH SLEEP APNEA UNSPECIFIED 01/26/2011  . UTI (urinary tract infection) 11/15/2010  . NASAL POLYP 07/28/2010  . FIBROIDS, UTERUS 07/27/2010  . COMPUTERIZED TOMOGRAPHY, CHEST, ABNORMAL 09/16/2009  . Seasonal and perennial allergic rhinitis 05/29/2009  . PERIPHERAL EDEMA 05/28/2009  . Mild persistent chronic asthma without complication 60/63/0160  . LEG PAIN, LEFT 06/30/2008  . COLONIC POLYPS, HX OF 06/30/2008  . OBESITY 12/12/2007  . Depression 12/12/2007  . Essential hypertension 12/12/2007  . RHINOSINUSITIS, CHRONIC 12/12/2007  . GERD 12/12/2007  . ARTHRITIS  12/12/2007    Scot Jun, PTA 01/08/2020, 4:12 PM  [place] [address] [place], [place], [address] Phone: [phone]   Fax:  [phone]  Name: Andrea Mason MRN: [id number] Date of Birth: 1951/03/03

## 2020-01-13 ENCOUNTER — Other Ambulatory Visit: Payer: Self-pay

## 2020-01-13 ENCOUNTER — Ambulatory Visit (HOSPITAL_COMMUNITY): Payer: PPO | Attending: Cardiovascular Disease

## 2020-01-13 ENCOUNTER — Telehealth: Payer: Self-pay

## 2020-01-13 ENCOUNTER — Encounter: Payer: Self-pay | Admitting: Physical Therapy

## 2020-01-13 ENCOUNTER — Ambulatory Visit: Payer: PPO | Admitting: Physical Therapy

## 2020-01-13 DIAGNOSIS — R262 Difficulty in walking, not elsewhere classified: Secondary | ICD-10-CM | POA: Diagnosis not present

## 2020-01-13 DIAGNOSIS — M6281 Muscle weakness (generalized): Secondary | ICD-10-CM

## 2020-01-13 DIAGNOSIS — R55 Syncope and collapse: Secondary | ICD-10-CM | POA: Diagnosis not present

## 2020-01-13 DIAGNOSIS — M79661 Pain in right lower leg: Secondary | ICD-10-CM

## 2020-01-13 LAB — MYOCARDIAL PERFUSION IMAGING
LV dias vol: 59 mL (ref 46–106)
LV sys vol: 23 mL
Peak HR: 101 {beats}/min
Rest HR: 68 {beats}/min
SDS: 3
SRS: 0
SSS: 3
TID: 1.02

## 2020-01-13 MED ORDER — REGADENOSON 0.4 MG/5ML IV SOLN
0.4000 mg | Freq: Once | INTRAVENOUS | Status: AC
  Administered 2020-01-13: 0.4 mg via INTRAVENOUS

## 2020-01-13 MED ORDER — TECHNETIUM TC 99M TETROFOSMIN IV KIT
10.3000 | PACK | Freq: Once | INTRAVENOUS | Status: AC | PRN
  Administered 2020-01-13: 10.3 via INTRAVENOUS
  Filled 2020-01-13: qty 11

## 2020-01-13 MED ORDER — TECHNETIUM TC 99M TETROFOSMIN IV KIT
32.2000 | PACK | Freq: Once | INTRAVENOUS | Status: AC | PRN
  Administered 2020-01-13: 32.2 via INTRAVENOUS
  Filled 2020-01-13: qty 33

## 2020-01-13 NOTE — Telephone Encounter (Signed)
New message   The patient wants to discuss with the MD should she have the nuclear test scheduled for today @ 10:30 am.

## 2020-01-13 NOTE — Telephone Encounter (Signed)
Note seen at 08:14pm.   Closing

## 2020-01-13 NOTE — Therapy (Signed)
[place], [place], [address] Phone: [phone]   Fax:  [phone]  Physical Therapy Treatment  Patient Details  Name: Andrea Mason MRN: [id number] Date of Birth: Sep 07, 1951 Referring Provider (PT): Dr Lynne Leader   Encounter Date: 01/13/2020  PT End of Session - 01/13/20 1546    Visit Number  5    Authorization Type  health team advantage    PT Start Time  6962    PT Stop Time  1558    PT Time Calculation (min)  43 min    Activity Tolerance  Patient tolerated treatment well    Behavior During Therapy  [facility] for tasks assessed/performed       Past Medical History:  Diagnosis Date  . Allergic rhinitis   . Allergy   . Asthma   . Depression   . DJD (degenerative joint disease), cervical   . GERD (gastroesophageal reflux disease)   . History of colonic polyps   . Hypertension   . Obesity   . 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 Stroke ([place])   . Syncope     Past Surgical History:  Procedure Laterality Date  . COLONOSCOPY    . FOOT SURGERY  1998  . 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 LOOP RECORDER REMOVAL N/A 10/02/2017   Procedure: LOOP RECORDER REMOVAL;  Surgeon: [name], MD;  Location: [place] [facility];  Service: Cardiovascular;  Laterality: N/A;  . NASAL POLYP SURGERY  07/2006   x 2 with Dr. [name]  . NASAL SINUS SURGERY  07/2006   Dr. [name]  . TUBAL LIGATION      There were no vitals filed for this visit.  Subjective Assessment - 01/13/20 1521    Subjective  "Im good"    Currently in Pain?  No/denies                       [facility] Adult PT Treatment/Exercise - 01/13/20 0001      Ambulation/Gait   Stairs  Yes    Stairs Assistance  6: Modified independent (Device/Increase time)    Stair Management Technique  One rail Left;Alternating pattern    Number of Stairs  48    Height of Stairs  6      Knee/Hip Exercises: Aerobic   Recumbent Bike  L1 x 4 min     Nustep  L4 x6 min       Knee/Hip Exercises: Machines for Strengthening   Cybex Knee Extension  RLE 5lb 3x5    Cybex Knee Flexion  RLE 15lb 2x5      Cybex Leg Press  30lb 2x10, RLE 20lb 2x10                    PT Long Term Goals - 01/08/20 1609      PT LONG TERM GOAL #1   Title  I with advanced HEP to include a walking program ( 01/21/2020)    Status  On-going      PT LONG TERM GOAL #2   Title  improve Rt hip and ankle strength =/> 4+/5 throughout to allow her to perform SLS =/> 10 sec ( 01/21/2020)    Status  Partially Met      PT LONG TERM GOAL #3   Title  improve LEFS =/> 71/80, 88.8% ( 01/21/2020)  Plan - 01/13/20 1547    Clinical Impression Statement  Pt reports that today was a good day. Progressed to stair negotiation Mod I alternating between one rail and none. Fatigue noted after two flights. Continues to progress with single leg strengthening. No reports of pain.    Personal Factors and Comorbidities  Comorbidity 3+    Comorbidities  multiple CVAs, HTN, bilat bunionectomy, cervical DJD, arthritis and GERD    Examination-Activity Limitations  Other;Stand    Examination-Participation Restrictions  Cleaning;Community Activity;Shop    Rehab Potential  Good    PT Frequency  2x / week    PT Duration  4 weeks    PT Treatment/Interventions  Taping;Vasopneumatic Device;Functional mobility training;Patient/family education;Moist Heat;Ultrasound;Passive range of motion;Therapeutic exercise;Cryotherapy;Electrical Stimulation;Neuromuscular re-education;Manual techniques;Dry needling    PT Next Visit Plan  manual work to [employer] and peroneals, LE and core strengthening and proprioception       Patient will benefit from skilled therapeutic intervention in order to improve the following deficits and impairments:  Difficulty walking, Obesity, Pain,  Decreased strength, Increased muscle spasms  Visit Diagnosis: Muscle weakness (generalized)  Difficulty in walking, not elsewhere classified  Pain in right lower leg     Problem List Patient Active Problem List   Diagnosis Date Noted  . CKD (chronic kidney disease) stage 3, GFR 30-59 ml/min 12/31/2019  . Painful legs and moving toes of right foot 12/16/2019  . Skin nodule 11/16/2019  . Nocturia 11/16/2019  . Low back pain 11/16/2019  . Alopecia 01/31/2019  . Upper airway cough syndrome 06/14/2018  . Rash 05/30/2018  . Vitamin D deficiency 08/31/2017  . Facial contusion, subsequent encounter 03/12/2017  . Vertigo 03/12/2017  . Right rotator cuff tear 07/27/2016  . Right shoulder pain 07/11/2016  . Overactive bladder 01/19/2016  . Cough variant asthma 09/07/2015  . Burn 09/06/2015  . Vocal cord dysfunction 04/05/2015  . Asthma with exacerbation 04/05/2015  . Left knee pain 03/12/2015  . Right knee pain 03/12/2015  . Cough 12/08/2014  . Skin lumps, generalized 09/08/2014  . Toe pain, right 08/06/2014  . Pre-ulcerative corn or callous 08/06/2014  . Hammertoe 08/06/2014  . Swelling of joint, ankle, right 08/06/2014  . Pansinusitis 06/02/2014  . Cerebral thrombosis with cerebral infarction ([place]) 05/26/2014  . Weakness 05/25/2014  . Right arm pain 05/25/2014  . Numbness in right leg 05/25/2014  . Numbness and tingling of right arm 05/25/2014  . Embolic stroke ([place]) 54/56/2563  . 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 Alterations of sensations, late effect of cerebrovascular disease(438.6) 02/10/2014  . Cerebral infarction ([place]) 11/25/2013  . Hyperlipidemia 11/24/2013  . Syncope 11/20/2013  . 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 Back pain 11/06/2013  . Hypokalemia 05/06/2013  . Family history of colon cancer 05/06/2013  . Lipoma 09/03/2012  . Hyperglycemia 08/15/2012  . Constipation 01/02/2012  . Preventative health care  05/24/2011  . 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 HYPERSOMNIA WITH SLEEP APNEA UNSPECIFIED 01/26/2011  . UTI (urinary tract infection) 11/15/2010  . NASAL POLYP 07/28/2010  . FIBROIDS, UTERUS 07/27/2010  . COMPUTERIZED TOMOGRAPHY, CHEST, ABNORMAL 09/16/2009  . Seasonal and perennial allergic rhinitis 05/29/2009  . PERIPHERAL EDEMA 05/28/2009  . Mild persistent chronic asthma without complication 89/37/3428  . LEG PAIN, LEFT 06/30/2008  . COLONIC POLYPS, HX OF 06/30/2008  . OBESITY 12/12/2007  . Depression 12/12/2007  . Essential hypertension 12/12/2007  . RHINOSINUSITIS, CHRONIC 12/12/2007  . GERD 12/12/2007  . ARTHRITIS 12/12/2007  Scot Jun, PTA 01/13/2020, 3:49 PM  [place] [address] [place], [place], [address] Phone: [phone]   Fax:  [phone]  Name: Andrea Mason MRN: [id number] Date of Birth: [birth year]

## 2020-01-15 ENCOUNTER — Ambulatory Visit: Payer: PPO | Admitting: Physical Therapy

## 2020-01-15 ENCOUNTER — Other Ambulatory Visit: Payer: Self-pay

## 2020-01-15 ENCOUNTER — Encounter: Payer: Self-pay | Admitting: Physical Therapy

## 2020-01-15 DIAGNOSIS — R262 Difficulty in walking, not elsewhere classified: Secondary | ICD-10-CM | POA: Diagnosis not present

## 2020-01-15 DIAGNOSIS — M79661 Pain in right lower leg: Secondary | ICD-10-CM

## 2020-01-15 DIAGNOSIS — M6281 Muscle weakness (generalized): Secondary | ICD-10-CM

## 2020-01-15 NOTE — Therapy (Signed)
[place], [place], [address] Phone: [phone]   Fax:  [phone]  Physical Therapy Treatment  Patient Details  Name: Andrea Mason MRN: [id number] Date of Birth: [birth year] Referring Provider (PT): Dr Lynne Leader   Encounter Date: 01/15/2020  PT End of Session - 01/15/20 1557    Visit Number  6    Date for PT Re-Evaluation  01/21/20    Authorization Type  health team advantage    PT Start Time  6962    PT Stop Time  1659    PT Time Calculation (min)  104 min       Past Medical History:  Diagnosis Date  . Allergic rhinitis   . Allergy   . Asthma   . Depression   . DJD (degenerative joint disease), cervical   . GERD (gastroesophageal reflux disease)   . History of colonic polyps   . Hypertension   . Obesity   . 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 Stroke ([place])   . Syncope     Past Surgical History:  Procedure Laterality Date  . COLONOSCOPY    . FOOT SURGERY  1998  . 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 LOOP RECORDER REMOVAL N/A 10/02/2017   Procedure: LOOP RECORDER REMOVAL;  Surgeon: [name], MD;  Location: [facility];  Service: Cardiovascular;  Laterality: N/A;  . NASAL POLYP SURGERY  07/2006   x 2 with Dr. [name]  . NASAL SINUS SURGERY  07/2006   Dr. [name]  . TUBAL LIGATION      There were no vitals filed for this visit.  Subjective Assessment - 01/15/20 1519    Subjective  "I am doing good, Im just a little bit tired because me and my sister went to work at [employer]"    Currently in Pain?  No/denies         [facility] PT Assessment - 01/15/20 0001      AROM   Right/Left Ankle  Right;Left    Right Ankle Dorsiflexion  13    Left Ankle Dorsiflexion  12      Strength   Right Hip External Rotation   4+/5    Right Hip Internal Rotation  4/5    Right  Hip ABduction  4+/5    Right Hip ADduction  4-/5                   [facility] Adult PT Treatment/Exercise - 01/15/20 0001      High Level Balance   High Level Balance Comments  SLS RLE for 5 seconds      Knee/Hip Exercises: Aerobic   Elliptical  I5 R 5 then 10 to slow pace x 3 min    Nustep  L5 x7 min       Knee/Hip Exercises: Machines for Strengthening   Cybex Leg Press  40lb 2x10, RLE 20lb 2x10      Other Machine  Rows 25lb 2x10       Knee/Hip Exercises: Seated   Sit to Sand  2 sets;10 reps;without UE support   OHP with yellow ball                  PT Long Term Goals - 01/15/20 1553      PT LONG TERM GOAL #2   Title  improve Rt hip  and ankle strength =/> 4+/5 throughout to allow her to perform SLS =/> 10 sec ( 01/21/2020)    Status  Partially Met            Plan - 01/15/20 1558    Clinical Impression Statement  Pt has progressed increasing her bilateral ankle AROM. Pt has increased her R hip strength. Pt was able to perform SLS on RLE for five seconds. Increase time needed for cognitives processing to stand on one leg. She did good with all machine level interventions.    Comorbidities  multiple CVAs, HTN, bilat bunionectomy, cervical DJD, arthritis and GERD    Examination-Participation Restrictions  Cleaning;Community Activity;Shop    Stability/Clinical Decision Making  Evolving/Moderate complexity    Rehab Potential  Good    PT Frequency  2x / week    PT Duration  2 weeks    PT Treatment/Interventions  Taping;Vasopneumatic Device;Functional mobility training;Patient/family education;Moist Heat;Ultrasound;Passive range of motion;Therapeutic exercise;Cryotherapy;Electrical Stimulation;Neuromuscular re-education;Manual techniques;Dry needling    PT Next Visit Plan  LE and core strengthening and proprioception       Patient will benefit from skilled therapeutic intervention in order to improve the following deficits and impairments:  Difficulty  walking, Obesity, Pain, Decreased strength, Increased muscle spasms  Visit Diagnosis: Difficulty in walking, not elsewhere classified  Pain in right lower leg  Muscle weakness (generalized)     Problem List Patient Active Problem List   Diagnosis Date Noted  . CKD (chronic kidney disease) stage 3, GFR 30-59 ml/min 12/31/2019  . Painful legs and moving toes of right foot 12/16/2019  . Skin nodule 11/16/2019  . Nocturia 11/16/2019  . Low back pain 11/16/2019  . Alopecia 01/31/2019  . Upper airway cough syndrome 06/14/2018  . Rash 05/30/2018  . Vitamin D deficiency 08/31/2017  . Facial contusion, subsequent encounter 03/12/2017  . Vertigo 03/12/2017  . Right rotator cuff tear 07/27/2016  . Right shoulder pain 07/11/2016  . Overactive bladder 01/19/2016  . Cough variant asthma 09/07/2015  . Burn 09/06/2015  . Vocal cord dysfunction 04/05/2015  . Asthma with exacerbation 04/05/2015  . Left knee pain 03/12/2015  . Right knee pain 03/12/2015  . Cough 12/08/2014  . Skin lumps, generalized 09/08/2014  . Toe pain, right 08/06/2014  . Pre-ulcerative corn or callous 08/06/2014  . Hammertoe 08/06/2014  . Swelling of joint, ankle, right 08/06/2014  . Pansinusitis 06/02/2014  . Cerebral thrombosis with cerebral infarction ([place]) 05/26/2014  . Weakness 05/25/2014  . Right arm pain 05/25/2014  . Numbness in right leg 05/25/2014  . Numbness and tingling of right arm 05/25/2014  . Embolic stroke ([place]) 93/81/8299  . 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 Alterations of sensations, late effect of cerebrovascular disease(438.6) 02/10/2014  . Cerebral infarction ([place]) 11/25/2013  . Hyperlipidemia 11/24/2013  . Syncope 11/20/2013  . 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 Back pain 11/06/2013  . Hypokalemia 05/06/2013  . Family history of colon cancer 05/06/2013  . Lipoma 09/03/2012  . Hyperglycemia 08/15/2012  . Constipation 01/02/2012  .  Preventative health care 05/24/2011  . 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 HYPERSOMNIA WITH SLEEP APNEA UNSPECIFIED 01/26/2011  . UTI (urinary tract infection) 11/15/2010  . NASAL POLYP 07/28/2010  . FIBROIDS, UTERUS 07/27/2010  . COMPUTERIZED TOMOGRAPHY, CHEST, ABNORMAL 09/16/2009  . Seasonal and perennial allergic rhinitis 05/29/2009  . PERIPHERAL EDEMA 05/28/2009  . Mild persistent chronic asthma without complication 37/16/9678  . LEG PAIN, LEFT 06/30/2008  . COLONIC POLYPS, HX OF 06/30/2008  .  OBESITY 12/12/2007  . Depression 12/12/2007  . Essential hypertension 12/12/2007  . RHINOSINUSITIS, CHRONIC 12/12/2007  . GERD 12/12/2007  . ARTHRITIS 12/12/2007    Scot Jun, PTA 01/15/2020, 4:00 PM  [place], [place], [address] Phone: [phone]   Fax:  [phone]  Name: Jaquitta Dupriest MRN: [id number] Date of Birth: 04/04/1951

## 2020-01-16 ENCOUNTER — Other Ambulatory Visit: Payer: Self-pay | Admitting: Internal Medicine

## 2020-01-16 ENCOUNTER — Telehealth: Payer: Self-pay | Admitting: Internal Medicine

## 2020-01-16 DIAGNOSIS — R739 Hyperglycemia, unspecified: Secondary | ICD-10-CM

## 2020-01-16 MED ORDER — LIVALO 4 MG PO TABS: 1.0000 | ORAL_TABLET | ORAL | 1 refills | Status: DC

## 2020-01-16 NOTE — Telephone Encounter (Signed)
°  Clarification requested. Patient states she should now be taking every day, NOT every other day  Patient request new RX for Pitavastatin Calcium (LIVALO) 4 MG TABS Pharmacy: [place], [place] [facility] [address]

## 2020-01-16 NOTE — Telephone Encounter (Signed)
I called and spoke with the patient after searching her chart, Dr. Gustavus Bryant area for paperwork. I advised her that Dr. Melvyn Novas has been working at the hospital this week and that I would touch base with him and his nurse. I advised her that we will call her back on Monday with an update. I verified that she was not out of medication because I didn't want her to go without over the weekend due to [place] not having any samples and she states that she has about 20 puffs remaining.   Dr. Melvyn Novas or Magda Paganini please advise on this and call the patient to update her. Thank you

## 2020-01-16 NOTE — Telephone Encounter (Signed)
erx sent in as requested.

## 2020-01-19 ENCOUNTER — Other Ambulatory Visit: Payer: Self-pay | Admitting: Internal Medicine

## 2020-01-20 ENCOUNTER — Encounter: Payer: Self-pay | Admitting: Physical Therapy

## 2020-01-20 ENCOUNTER — Ambulatory Visit (INDEPENDENT_AMBULATORY_CARE_PROVIDER_SITE_OTHER): Payer: PPO | Admitting: Family Medicine

## 2020-01-20 ENCOUNTER — Encounter: Payer: Self-pay | Admitting: Family Medicine

## 2020-01-20 ENCOUNTER — Other Ambulatory Visit: Payer: Self-pay

## 2020-01-20 ENCOUNTER — Ambulatory Visit: Payer: PPO | Admitting: Physical Therapy

## 2020-01-20 VITALS — BP 110/78 | HR 72 | Ht 62.0 in | Wt 197.6 lb

## 2020-01-20 DIAGNOSIS — R262 Difficulty in walking, not elsewhere classified: Secondary | ICD-10-CM | POA: Diagnosis not present

## 2020-01-20 DIAGNOSIS — M6281 Muscle weakness (generalized): Secondary | ICD-10-CM

## 2020-01-20 DIAGNOSIS — M5416 Radiculopathy, lumbar region: Secondary | ICD-10-CM

## 2020-01-20 DIAGNOSIS — M79661 Pain in right lower leg: Secondary | ICD-10-CM

## 2020-01-20 MED ORDER — LORAZEPAM 0.5 MG PO TABS: ORAL_TABLET | ORAL | 0 refills | Status: DC

## 2020-01-20 NOTE — Therapy (Signed)
[place], [place], [address] Phone: [phone]   Fax:  [phone]  Physical Therapy Treatment  Patient Details  Name: Andrea Mason MRN: [id number] Date of Birth: Dec 31, 1950 Referring Provider (PT): Dr Lynne Leader   Encounter Date: 01/20/2020  PT End of Session - 01/20/20 1426    Visit Number  7    Date for PT Re-Evaluation  01/21/20    Authorization Type  health team advantage    PT Start Time  3748    PT Stop Time  1430    PT Time Calculation (min)  45 min    Activity Tolerance  Patient tolerated treatment well    Behavior During Therapy  [facility] for tasks assessed/performed       Past Medical History:  Diagnosis Date  . Allergic rhinitis   . Allergy   . Asthma   . Depression   . DJD (degenerative joint disease), cervical   . GERD (gastroesophageal reflux disease)   . History of colonic polyps   . Hypertension   . Obesity   . 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 Stroke ([place])   . Syncope     Past Surgical History:  Procedure Laterality Date  . COLONOSCOPY    . FOOT SURGERY  1998  . 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 LOOP RECORDER REMOVAL N/A 10/02/2017   Procedure: LOOP RECORDER REMOVAL;  Surgeon: [name], MD;  Location: [facility];  Service: Cardiovascular;  Laterality: N/A;  . NASAL POLYP SURGERY  07/2006   x 2 with Dr. [name]  . NASAL SINUS SURGERY  07/2006   Dr. [name]  . TUBAL LIGATION      There were no vitals filed for this visit.  Subjective Assessment - 01/20/20 1350    Subjective  "I feel good"    Currently in Pain?  No/denies                       [facility] Adult PT Treatment/Exercise - 01/20/20 0001      Exercises   Exercises  Lumbar      Lumbar Exercises: Machines for Strengthening   Cybex Knee Extension  5lb 2x10 RLE    Cybex  Knee Flexion  25lb 2x10; RLE 15lb 2x5      Cybex Leg Press  40lb 2x10, RLE 20lb 2x10      Elliptical  I5 R 5  x 3 min      Lumbar Exercises: Standing   Row  Strengthening;Theraband;15 reps;Both   x2     Lumbar Exercises: Seated   Sit to Stand  10 reps   x2 OHP with yellow ball      Knee/Hip Exercises: Aerobic   Nustep  L5 x7 min                   PT Long Term Goals - 01/15/20 1553      PT LONG TERM GOAL #2   Title  improve Rt hip and ankle strength =/> 4+/5 throughout to allow her to perform SLS =/> 10 sec ( 01/21/2020)    Status  Partially Met            Plan - 01/20/20 1427    Clinical Impression Statement  Pt did well today with some increase fatigue. No reports of pain in today's session.  RLE fatigues quick with SL exercises with some visible quad shaking. Postural cues needed with standing rows.    Personal Factors and Comorbidities  Comorbidity 3+    Comorbidities  multiple CVAs, HTN, bilat bunionectomy, cervical DJD, arthritis and GERD    Examination-Activity Limitations  Other;Stand    Examination-Participation Restrictions  Cleaning;Community Activity;Shop    PT Treatment/Interventions  Taping;Vasopneumatic Device;Functional mobility training;Patient/family education;Moist Heat;Ultrasound;Passive range of motion;Therapeutic exercise;Cryotherapy;Electrical Stimulation;Neuromuscular re-education;Manual techniques;Dry needling    PT Next Visit Plan  LE and core strengthening and proprioception       Patient will benefit from skilled therapeutic intervention in order to improve the following deficits and impairments:  Difficulty walking, Obesity, Pain, Decreased strength, Increased muscle spasms  Visit Diagnosis: Pain in right lower leg  Muscle weakness (generalized)  Difficulty in walking, not elsewhere classified     Problem List Patient Active Problem List   Diagnosis Date Noted  . CKD (chronic kidney disease) stage 3, GFR 30-59 ml/min  12/31/2019  . Painful legs and moving toes of right foot 12/16/2019  . Skin nodule 11/16/2019  . Nocturia 11/16/2019  . Low back pain 11/16/2019  . Alopecia 01/31/2019  . Upper airway cough syndrome 06/14/2018  . Rash 05/30/2018  . Vitamin D deficiency 08/31/2017  . Facial contusion, subsequent encounter 03/12/2017  . Vertigo 03/12/2017  . Right rotator cuff tear 07/27/2016  . Right shoulder pain 07/11/2016  . Overactive bladder 01/19/2016  . Cough variant asthma 09/07/2015  . Burn 09/06/2015  . Vocal cord dysfunction 04/05/2015  . Asthma with exacerbation 04/05/2015  . Left knee pain 03/12/2015  . Right knee pain 03/12/2015  . Cough 12/08/2014  . Skin lumps, generalized 09/08/2014  . Toe pain, right 08/06/2014  . Pre-ulcerative corn or callous 08/06/2014  . Hammertoe 08/06/2014  . Swelling of joint, ankle, right 08/06/2014  . Pansinusitis 06/02/2014  . Cerebral thrombosis with cerebral infarction ([place]) 05/26/2014  . Weakness 05/25/2014  . Right arm pain 05/25/2014  . Numbness in right leg 05/25/2014  . Numbness and tingling of right arm 05/25/2014  . Embolic stroke ([place]) 77/93/9030  . 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 Alterations of sensations, late effect of cerebrovascular disease(438.6) 02/10/2014  . Cerebral infarction ([place]) 11/25/2013  . Hyperlipidemia 11/24/2013  . Syncope 11/20/2013  . 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 Back pain 11/06/2013  . Hypokalemia 05/06/2013  . Family history of colon cancer 05/06/2013  . Lipoma 09/03/2012  . Hyperglycemia 08/15/2012  . Constipation 01/02/2012  . Preventative health care 05/24/2011  . 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 HYPERSOMNIA WITH SLEEP APNEA UNSPECIFIED 01/26/2011  . UTI (urinary tract infection) 11/15/2010  . NASAL POLYP 07/28/2010  . FIBROIDS, UTERUS 07/27/2010  . COMPUTERIZED TOMOGRAPHY, CHEST, ABNORMAL 09/16/2009  . Seasonal and perennial allergic  rhinitis 05/29/2009  . PERIPHERAL EDEMA 05/28/2009  . Mild persistent chronic asthma without complication 08/26/3006  . LEG PAIN, LEFT 06/30/2008  . COLONIC POLYPS, HX OF 06/30/2008  . OBESITY 12/12/2007  . Depression 12/12/2007  . Essential hypertension 12/12/2007  . RHINOSINUSITIS, CHRONIC 12/12/2007  . GERD 12/12/2007  . ARTHRITIS 12/12/2007    Scot Jun, PTA 01/20/2020, 2:31 PM  [place] [address] [place], [place], [address] Phone: [phone]   Fax:  [phone]  Name: Afiya Ferrebee MRN: [id number] Date of Birth: 09-Jan-1951

## 2020-01-20 NOTE — Telephone Encounter (Signed)
Has anything been received in regards to the pt's patient assistance for [facility]?

## 2020-01-20 NOTE — [place] Notes (Signed)
I, Wendy Poet, LAT, ATC, am serving as scribe for Dr. Lynne Leader.  Andrea Mason is a 69 y.o. female who presents to [place] at [facility] today for f/u of R leg and R foot pain x 2 months.  She was last seen by Dr. Georgina Snell on 12/17/19 and was having 4-5/10 pain mainly at the R dorsal foot along the 2nd MT that was worse w/ weight bearing.  She was referred to outpatient PT and has completed 7 visits.  She has tried a steroid dose pack and horse linament.  Since her last visit, pt reports that her R foot pain has improved and notes that she is walking better.  She notes approximately 70-80% improvement.  She states that she con't to feel "static" in her R anterior lower leg.  She states that she con't to have low back pain and notices that she tends to shift her weight to her L leg when standing.  She is no longer taking the Meloxicam.  She notes she does continue to experience pain in her left lateral leg and foot.  She notes she feels numbness and tingling and pain.  Diagnostic imaging: R tib/fib XR and L spine XR- 12/17/19; B foot XR- 11/26/19   Pertinent review of systems: No fevers or chills.  Relevant historical information: Hypertension.  History of stroke.   Exam:  BP 110/78 (BP Location: Left Arm, Patient Position: Sitting, Cuff Size: Large)   Pulse 72   Ht 5\' 2"  (1.575 m)   Wt 197 lb 9.6 oz (89.6 kg)   LMP  (LMP Unknown)   SpO2 98%   BMI 36.14 kg/m  General: Well Developed, well nourished, and in no acute distress.   MSK:  L-spine: Nontender to spinal midline normal lumbar motion.  Lower extremity strength is intact throughout bilateral extremities.  Reflexes and sensation are intact. Positive right-sided slump test.  Right lower leg: Normal-appearing nontender throughout knee lower leg ankle and foot. Normal motion. Intact strength.     Lab and Radiology Results  EXAM: LUMBAR SPINE - COMPLETE 4+ VIEW  COMPARISON:  CT abdomen  09/14/2009  FINDINGS: Five lumbar vertebrae. Vertebral alignment is preserved. Vertebral body height is maintained. No compression deformity. No convincing pars interarticularis defect on oblique radiographs. Mild to moderate disc disc height loss at L4-L5 and L5-S1. No more than mild disc height loss at the remaining levels. Mild-to-moderate multilevel facet arthrosis greatest within the lower lumbar spine.  IMPRESSION: No compression deformity.  Lumbar spondylosis greatest at L4-L5 and L5-S1.   Electronically Signed   By: Kellie Simmering DO   On: 12/18/2019 08:00  I, Lynne Leader, personally (independently) visualized and performed the interpretation of the images attached in this note.     Assessment and Plan: 69 y.o. female with right lower leg pain.  Patient has had significant improvement with physical therapy but has persistent pain in the right lower leg consistent with L5 dermatome.  She certainly has changes in this area on her L-spine x-ray from last month.  I think her residual symptoms are due to lumbar radiculopathy we will proceed with lumbar MRI to evaluate this further.  Likely would proceed with epidural steroid injection as well.    Orders Placed This Encounter  Procedures  . MR Lumbar Spine Wo Contrast    Standing Status:   Future    Standing Expiration Date:   03/19/2021    Order Specific Question:   ** REASON FOR EXAM (FREE  TEXT)    Answer:   Right L5    Order Specific Question:   What is the patient's sedation requirement?    Answer:   No Sedation    Order Specific Question:   Does the patient have a pacemaker or implanted devices?    Answer:   No    Order Specific Question:   Preferred imaging location?    Answer:   Product/process development scientist (table limit-350lbs)    Order Specific Question:   Radiology Contrast Protocol - do NOT remove file path    Answer:   [email or web address]   Meds ordered this encounter  Medications  .  LORazepam (ATIVAN) 0.5 MG tablet    Sig: 1-2 tabs 30 - 60 min prior to MRI. Do not drive with this medicine.    Dispense:  4 tablet    Refill:  0     Discussed warning signs or symptoms. Please see discharge instructions. Patient expresses understanding.   The above documentation has been reviewed and is accurate and complete Lynne Leader

## 2020-01-20 NOTE — Patient Instructions (Addendum)
Thank you for coming in today. Lets plan for MRI of your low back.  Please recheck with me after the MRI.  You should hear about MRI soon.  Let me know if you do not hear anything.

## 2020-01-21 MED ORDER — BUDESONIDE-FORMOTEROL FUMARATE 80-4.5 MCG/ACT IN AERO: 2.0000 | INHALATION_SPRAY | Freq: Two times a day (BID) | RESPIRATORY_TRACT | 11 refills | Status: DC

## 2020-01-21 NOTE — Telephone Encounter (Signed)
rx for symbicort sent per her request  She states that [employer] did not receive her pt assistance forms  Will need to resubmit and mail out certified this time  I mailed her the forms  She will complete her portion and drop of and then we will complete and mail certified to [employer]

## 2020-01-21 NOTE — Telephone Encounter (Signed)
Pt called in a little antsy because nobody has called her back - was told that someone would call her back on Monday - she needs to know about her paperwork for [facility] and if we have any samples. Also she would like to know if she can get a RX for Symbicort. If she can't get [facility] - she would like the Symbicort. Pharmacy is [place]  CB# [phone]

## 2020-01-22 ENCOUNTER — Ambulatory Visit: Payer: PPO | Admitting: Physical Therapy

## 2020-01-25 ENCOUNTER — Other Ambulatory Visit: Payer: Self-pay

## 2020-01-25 ENCOUNTER — Ambulatory Visit (INDEPENDENT_AMBULATORY_CARE_PROVIDER_SITE_OTHER): Payer: PPO

## 2020-01-25 DIAGNOSIS — M5416 Radiculopathy, lumbar region: Secondary | ICD-10-CM | POA: Diagnosis not present

## 2020-01-25 DIAGNOSIS — M545 Low back pain: Secondary | ICD-10-CM | POA: Diagnosis not present

## 2020-01-26 NOTE — [place] Notes (Signed)
MRI lumbar spine shows multilevel spinal stenosis and narrowing potentially impacting nerve roots L2-L5.  Next step would be epidural steroid injection.  Would you like me to order that now?  Recommend return to clinic to discuss MRI findings in detail and to review the MRI images and discussed the injection.

## 2020-01-28 ENCOUNTER — Ambulatory Visit (INDEPENDENT_AMBULATORY_CARE_PROVIDER_SITE_OTHER): Payer: PPO | Admitting: Family Medicine

## 2020-01-28 ENCOUNTER — Other Ambulatory Visit: Payer: Self-pay

## 2020-01-28 ENCOUNTER — Encounter: Payer: Self-pay | Admitting: Family Medicine

## 2020-01-28 VITALS — BP 122/84 | HR 89 | Ht 62.0 in | Wt 197.0 lb

## 2020-01-28 DIAGNOSIS — M5416 Radiculopathy, lumbar region: Secondary | ICD-10-CM | POA: Diagnosis not present

## 2020-01-28 NOTE — Progress Notes (Signed)
I, Wendy Poet, LAT, ATC, am serving as scribe for Dr. Lynne Leader.  Andrea Mason is a 69 y.o. female who presents to [place] at [facility] today for f/u of R foot and leg pain, low back pain and L-spine MRI review.  She was last seen by Dr. Georgina Snell on 01/20/20 and reported improvement in her R foot and lower leg pain but con't to feel "static" sensations in her R anterior lower leg.  She also noted con't low back pain and L lateral lower leg and foot pain.  Since her last visit, pt reports feeling okay is going to PT the pain still feels the same but comes and goes does effect her walking.  Diagnostic testing: L-spine XR and R tib/fib XR- 12/17/19; L-spine MRI- 01/25/20   Pertinent review of systems: No fevers or chills.  Relevant historical information: History of prior stroke in 2014.  Currently taking aspirin 325 mg daily.   Exam:  BP 122/84 (BP Location: Left Arm, Patient Position: Sitting, Cuff Size: Normal)   Pulse 89   Ht 5\' 2"  (1.575 m)   Wt 197 lb (89.4 kg)   LMP  (LMP Unknown)   SpO2 99%   BMI 36.03 kg/m  General: Well Developed, well nourished, and in no acute distress.   MSK: L-spine: Normal motion.  Lower extremity strength is intact.  Normal gait.    Lab and Radiology Results No results found for this or any previous visit (from the past 72 hour(s)). MR Lumbar Spine Wo Contrast  Result Date: 01/25/2020 CLINICAL DATA:  69 year old female with low back pain, posterior right leg pain for 2 months. No known injury. EXAM: MRI LUMBAR SPINE WITHOUT CONTRAST TECHNIQUE: Multiplanar, multisequence MR imaging of the lumbar spine was performed. No intravenous contrast was administered. COMPARISON:  Lumbar radiographs 12/17/2019. FINDINGS: Segmentation:  Normal on the comparison. Alignment: Improved lumbar lordosis from last month. There is mild grade 1 anterolisthesis of L2 on L3, 2-3 mm. Vertebrae: There is a faint L1 superior endplate Schmorl's node with  trace marrow edema. No other marrow edema or evidence of acute osseous abnormality. Background bone marrow signal within normal limits. Intact visible sacrum and SI joints. Conus medullaris and cauda equina: Conus extends to the T12-L1 level. No lower spinal cord or conus signal abnormality. Paraspinal and other soft tissues: Negative. Disc levels: T12-L1: Mild to moderate ligament flavum hypertrophy, but no significant stenosis. L1-L2: Minimal disc bulge and mild ligament flavum hypertrophy. No stenosis. L2-L3: Mild grade 1 anterolisthesis. Disc desiccation and disc space loss. Circumferential disc bulge. Moderate posterior element hypertrophy. Mild to moderate spinal and right lateral recess stenosis (right L3 nerve level). Moderate to severe right L2 foraminal stenosis in large part due to disc. Mild to moderate left foraminal stenosis. L3-L4: Disc desiccation and circumferential disc bulge with small central annular fissure. Moderate posterior element hypertrophy. Mild to moderate spinal and left lateral recess stenosis (left L4 nerve level). There is also fairly bulky foraminal disc at this level on the left with at least moderate L3 neural foraminal stenosis (series 4, image 12). Moderate right foraminal stenosis. L4-L5: Disc desiccation and circumferential disc bulge with superimposed slightly caudal central disc protrusion (series 4, image 7 and series 9, image 31). Moderate posterior element hypertrophy. Mild to moderate spinal and moderate to severe lateral recess stenosis (L5 nerve levels). Moderate bilateral L4 foraminal stenosis. L5-S1: Left far lateral disc osteophyte complex. Epidural lipomatosis effaces CSF from the thecal sac at this level. Only  mild left L5 foraminal stenosis. IMPRESSION: 1. Multilevel advanced lumbar disc and posterior element degeneration L2-L3 through L3-L4. Grade 1 anterolisthesis at the former. 2. Up to moderate multifactorial spinal and lateral recess stenosis at those  levels, as well as right foraminal stenosis at each level, which could combine for right L2 through L5 nerve level radiculitis. Electronically Signed   By: Genevie Ann M.D.   On: 01/25/2020 17:06  I, Lynne Leader, personally (independently) visualized and performed the interpretation of the images attached in this note.      Assessment and Plan: 69 y.o. female with right leg pain due to lumbar radiculopathy.  Although she has multiple levels that certainly could be affected the predominant level is L4 based on her current symptoms.  Plan to address this with transforaminal epidural steroid injection at L4-L5 level on the right.  Cautioned patient to stop aspirin 7 days prior to injection and hold for 3 days following the injection.  Discussed risk of stroke off of aspirin.  Feel that the risk of stroke will be minimal for the 10 days that she is off of aspirin and she thinks that the risk is worth the potential benefit of less pain following injection.  She will keep me updated how she feels after the injection.  Orders Placed This Encounter  Procedures  . DG Epidural/Nerve Root    Standing Status:   Future    Standing Expiration Date:   03/27/2021    Scheduling Instructions:     Informed pt to stop Aspirin 1 week prior to injection and start 3 days after injection    Order Specific Question:   Reason for Exam (SYMPTOM  OR DIAGNOSIS REQUIRED)    Answer:   rt L4-L5 for L4 radicular pain    Order Specific Question:   Preferred imaging location?    Answer:   [id number]   No orders of the defined types were placed in this encounter.    Discussed warning signs or symptoms. Please see discharge instructions. Patient expresses understanding.   The above documentation has been reviewed and is accurate and complete Lynne Leader   Total encounter time 20 minutes including charting time date of service.

## 2020-01-28 NOTE — Patient Instructions (Signed)
Thank you for coming in today. You should hear from [facility] soon about injection. You can call them as well at [phone] to schedule.  STOP aspirin 7 days prior to the injection and restart it 3 days after the injection.  Let me know how you are doing.

## 2020-02-02 ENCOUNTER — Ambulatory Visit
Admission: RE | Admit: 2020-02-02 | Discharge: 2020-02-02 | Disposition: A | Discharge status: Home / Self Care | Payer: PPO | Source: Ambulatory Visit | Attending: Family Medicine | Admitting: Family Medicine

## 2020-02-02 ENCOUNTER — Other Ambulatory Visit: Payer: Self-pay

## 2020-02-02 ENCOUNTER — Other Ambulatory Visit: Payer: Self-pay | Admitting: Internal Medicine

## 2020-02-02 DIAGNOSIS — M5416 Radiculopathy, lumbar region: Secondary | ICD-10-CM

## 2020-02-02 DIAGNOSIS — R739 Hyperglycemia, unspecified: Secondary | ICD-10-CM

## 2020-02-02 DIAGNOSIS — M4727 Other spondylosis with radiculopathy, lumbosacral region: Secondary | ICD-10-CM | POA: Diagnosis not present

## 2020-02-02 MED ORDER — METHYLPREDNISOLONE ACETATE 40 MG/ML INJ SUSP (RADIOLOG
120.0000 mg | Freq: Once | INTRAMUSCULAR | Status: AC
  Administered 2020-02-02: 120 mg via EPIDURAL

## 2020-02-02 MED ORDER — IOPAMIDOL (ISOVUE-M 200) INJECTION 41%
1.0000 mL | Freq: Once | INTRAMUSCULAR | Status: AC
  Administered 2020-02-02: 1 mL via EPIDURAL

## 2020-02-02 NOTE — Discharge Instructions (Signed)

## 2020-02-02 NOTE — Telephone Encounter (Signed)
New Message:   Pt c/o medication issue:  1. Name of Medication: Pitavastatin Calcium (LIVALO) 4 MG TABS  2. What is your medication issue? Pt states this medication was supposed to be taken every day instead of every other day per Dr. Jenny Reichmann. She states the new way of taking this medication needs to be sent to the pharmacy because they are not giving her enough pills.

## 2020-02-02 NOTE — Telephone Encounter (Signed)
Please refill as per office routine med refill policy (all routine meds refilled for 3 mo or monthly per pt preference up to one year from last visit, then month to month grace period for 3 mo, then further med refills will have to be denied)  

## 2020-02-03 MED ORDER — LIVALO 4 MG PO TABS: 1.0000 | ORAL_TABLET | Freq: Every day | ORAL | 1 refills | Status: DC

## 2020-02-03 NOTE — Telephone Encounter (Signed)
Resent rx to reflect taking daily.

## 2020-02-06 ENCOUNTER — Encounter: Payer: Self-pay | Admitting: Gastroenterology

## 2020-02-06 ENCOUNTER — Ambulatory Visit (INDEPENDENT_AMBULATORY_CARE_PROVIDER_SITE_OTHER): Payer: PPO | Admitting: Gastroenterology

## 2020-02-06 VITALS — BP 110/78 | HR 85 | Temp 98.3°F | Ht 62.0 in | Wt 197.0 lb

## 2020-02-06 DIAGNOSIS — Z01818 Encounter for other preprocedural examination: Secondary | ICD-10-CM | POA: Diagnosis not present

## 2020-02-06 DIAGNOSIS — K219 Gastro-esophageal reflux disease without esophagitis: Secondary | ICD-10-CM | POA: Diagnosis not present

## 2020-02-06 DIAGNOSIS — R142 Eructation: Secondary | ICD-10-CM

## 2020-02-06 MED ORDER — PANTOPRAZOLE SODIUM 40 MG PO TBEC: 40.0000 mg | DELAYED_RELEASE_TABLET | Freq: Two times a day (BID) | ORAL | 1 refills | Status: DC

## 2020-02-06 NOTE — Patient Instructions (Addendum)
If you are age 67 or older, your body mass index should be between 23-30. Your Body mass index is 36.03 kg/m. If this is out of the aforementioned range listed, please consider follow up with your Primary Care Provider.  If you are age 65 or younger, your body mass index should be between 19-25. Your Body mass index is 36.03 kg/m. If this is out of the aformentioned range listed, please consider follow up with your Primary Care Provider.   You have been scheduled for an endoscopy. Please follow written instructions given to you at your visit today. If you use inhalers (even only as needed), please bring them with you on the day of your procedure.   Be sure to take your Protonix to 30 minutes before meals.  Please purchase the following medications over the counter and take as directed: Gas-X (Simethicone)  Thank you for entrusting me with your care and for choosing [facility], Dr. [place] Cellar

## 2020-02-06 NOTE — Progress Notes (Signed)
HPI :  69 year old female known to me for history of colon polyps and prior colonoscopy last July 2020, here for a follow-up visit to discuss reflux.  She states she has had GERD for very long time.  She has been using antacids periodically for a few years, she has been on Protonix 40 mg a day for the past few years, taking this with meals daily.  She states more recently symptoms have been getting worse and her medications are not helping her.  When I asked her what reflux sometimes bother her she states immediately belching and increased gas production.  She has rare dysphagia to solids.  She denies much heartburn or regurgitation or other symptoms of reflux otherwise.  No nausea or vomiting.  She denies any clear food triggers that bother her.  She states she has been taking Tums as needed for her symptoms which she thinks can help when she takes it.  She sleeps with the head of the bed elevated but does have nocturnal symptoms that bother her.  She denies drinking much carbonated beverages or chewing gum routinely.  She has never had an endoscopy and states she has been on antacids for years.  No abdominal pains otherwise.  She denies any cardiopulmonary symptoms currently, endorses is a history of asthma which historically she states has been triggered by her reflux.  Of note, she had a nuclear stress test recently which was negative.  Colonoscopy 07/02/19 - The perianal and digital rectal examinations were normal. - A 3 mm polyp was found in the cecum. The polyp was flat. The polyp was removed with a cold snare. Resection and retrieval were complete. - Three sessile polyps were found in the transverse colon. The polyps were 2 to 6 mm in size. These polyps were removed with a cold snare. Resection and retrieval were complete. - The colon was tortuous. - The exam was otherwise without abnormality.  2 of 4 polyps adenomas Repeat in 5 years given FH of colon cancer     Past Medical History:   Diagnosis Date  . Allergic rhinitis   . Allergy   . Asthma   . Depression   . DJD (degenerative joint disease), cervical   . GERD (gastroesophageal reflux disease)   . History of colonic polyps   . Hypertension   . Obesity   . 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 Stroke ([place])   . Syncope      Past Surgical History:  Procedure Laterality Date  . COLONOSCOPY    . FOOT SURGERY  1998  . 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 LOOP RECORDER REMOVAL N/A 10/02/2017   Procedure: LOOP RECORDER REMOVAL;  Surgeon: [name], MD;  Location: [place] [facility];  Service: Cardiovascular;  Laterality: N/A;  . NASAL POLYP SURGERY  07/2006   x 2 with Dr. [name]  . NASAL SINUS SURGERY  07/2006   Dr. [name]  . TUBAL LIGATION     Family History  Problem Relation Age of Onset  . Colon cancer Brother   . Stroke Brother   . Arrhythmia Brother   . Cancer Mother   . Diabetes Sister   . Lung disease Son        Sarcoid vs ARDS related fibrosis, required 2 double lung transplants  . Congenital heart disease Brother   . Heart attack Brother   . Esophageal cancer Neg Hx   .  Rectal cancer Neg Hx   . Stomach cancer Neg Hx    Social History   Tobacco Use  . Smoking status: Never Smoker  . Smokeless tobacco: Never Used  Substance Use Topics  . Alcohol use: Yes    Comment: socially wine  . Drug use: No   Current Outpatient Medications  Medication Sig Dispense Refill  . aspirin 325 MG tablet Take 325 mg by mouth daily.    . Biotin 10000 MCG TABS Take 10,000 mcg by mouth daily.    . budesonide-formoterol (SYMBICORT) 80-4.5 MCG/ACT inhaler Inhale 2 puffs into the lungs 2 (two) times daily. 1 Inhaler 11  . cetirizine (ZYRTEC ALLERGY) 10 MG tablet Take 10 mg by mouth at bedtime.     . Cholecalciferol (VITAMIN D3) 2000 units TABS Take 2,000 Units by mouth daily.    Marland Kitchen dextromethorphan-guaiFENesin  (MUCINEX DM) 30-600 MG 12hr tablet Take 1-2 tablets by mouth 2 (two) times daily as needed (for congestion/cough (WITH FLUTTER VALVE)).     . famotidine (PEPCID) 20 MG tablet Take 1 tablet (20 mg total) by mouth at bedtime. 90 tablet 3  . fesoterodine (TOVIAZ) 8 MG TB24 tablet Take 1 tablet (8 mg total) by mouth daily. 90 tablet 3  . mometasone-formoterol (DULERA) 100-5 MCG/ACT AERO Inhale 2 puffs into the lungs 2 (two) times daily. 1 Inhaler 3  . montelukast (SINGULAIR) 10 MG tablet Take 1 tablet (10 mg total) by mouth at bedtime. 30 tablet 4  . oxymetazoline (AFRIN) 0.05 % nasal spray Place 2 sprays into both nostrils 2 (two) times daily as needed (nasal stuffiness and nasal pressure for 5 days).     . pantoprazole (PROTONIX) 40 MG tablet TAKE 1 TABLET(40 MG) BY MOUTH DAILY 90 tablet 1  . Pitavastatin Calcium (LIVALO) 4 MG TABS Take 1 tablet (4 mg total) by mouth daily. 90 tablet 1  . sodium chloride (OCEAN) 0.65 % SOLN nasal spray Place 2 sprays into both nostrils as needed (dry nose).     Marland Kitchen spironolactone (ALDACTONE) 25 MG tablet Take 25 mg by mouth daily.     Marland Kitchen triamcinolone (NASACORT) 55 MCG/ACT AERO nasal inhaler Place 2 sprays into the nose daily. 1 Inhaler 12   No current facility-administered medications for this visit.   Allergies  Allergen Reactions  . Ivp Dye [Iodinated Diagnostic Agents] Nausea And Vomiting and Other (See Comments)    hot flashes  . Promethazine-Codeine Nausea And Vomiting  . Tramadol Other (See Comments)    Almost passed out     Review of Systems: All systems reviewed and negative except where noted in HPI.   Lab Results  Component Value Date   WBC 9.9 12/26/2019   HGB 13.3 12/26/2019   HCT 39.0 12/26/2019   MCV 99.8 12/26/2019   PLT 236 12/26/2019    Lab Results  Component Value Date   CREATININE 1.50 (H) 12/26/2019   BUN 40 (H) 12/26/2019   NA 138 12/26/2019   K 4.3 12/26/2019   CL 107 12/26/2019   CO2 24 12/26/2019    Lab Results    Component Value Date   ALT 18 12/26/2019   AST 18 12/26/2019   ALKPHOS 89 12/26/2019   BILITOT 0.8 12/26/2019     Physical Exam: BP 110/78   Pulse 85   Temp 98.3 F (36.8 C)   Ht 5\' 2"  (1.575 m)   Wt 197 lb (89.4 kg)   LMP  (LMP Unknown)   BMI 36.03 kg/m  Constitutional: Pleasant,well-developed,  female in no acute distress. Neurological: Alert and oriented to person place and time. Psychiatric: Normal mood and affect. Behavior is normal.   ASSESSMENT AND PLAN: 69 year old female here for reassessment of the following:  Belching / GERD - patient endorses longstanding reflux symptoms however on questioning her about the symptoms she appears to mostly have belching and increased gas, unclear if she truly has GERD driving this process, although she does endorse some benefit from antacids over the years.  Symptoms have been longstanding and I discussed belching with her.  Unclear if this is supra gastric belching and she has aerophagia, while hiatal hernia is on the differential as well.  Given duration and persistence of symptoms, with rare dysphagia and some question of benefit to antacids, I offered her an EGD to further evaluate, rule out Barrett's esophagus, assess for large hiatal hernia.  In the interim she will continue Protonix but recommend trial of Gas-X over-the-counter to see if that will help her belching.  We may consider a trial of baclofen pending her course to treat the belching however this is often poorly tolerated due to drowsiness.  We will see how she does and await her endoscopy with further recommendations.  She agreed  I spent 35 minutes of time, including in depth chart review, independent review of results as outlined above, communicating results with the patient directly, face-to-face time with the patient, coordinating care, and ordering studies and medications as appropriate, and documenting this encounter.  [place] Cellar, MD [facility]

## 2020-02-09 ENCOUNTER — Encounter: Payer: Self-pay | Admitting: Gastroenterology

## 2020-02-09 NOTE — Telephone Encounter (Signed)
Spoke to pharmacy and they were waiting on an override.   Pt informed of same.

## 2020-02-09 NOTE — Telephone Encounter (Signed)
   Patient states pharmacy is telling her they still do not have RX. Scheduler called pharmacy spoke with [facility] who states " they have RX, just need patient to call" Advised patient to contact pharmacy.

## 2020-02-10 ENCOUNTER — Telehealth: Payer: Self-pay

## 2020-02-10 NOTE — Telephone Encounter (Signed)
There may be patient assistance if pt agrees to complete their part of the forms.

## 2020-02-10 NOTE — Telephone Encounter (Signed)
New message    The medication cost is too high $ 90.00  Pitavastatin Calcium (LIVALO) 4 MG TABS. Aware it's not generic for this mediation.   The patient is asking can Dr. Alain Marion or the nurse look at all her medication to determine a cheaper cost or does the office has a sample.   Due to her income status.

## 2020-02-10 NOTE — Telephone Encounter (Signed)
Spoke with Imani from [facility] [employer] she stated because it is a 90 day supply it's $90, insurance pays for it because it is originally a $1,000 medication it was not much the pharmacy could do.There isn't a cheaper medication.

## 2020-02-11 MED ORDER — ATORVASTATIN CALCIUM 10 MG PO TABS: 10.0000 mg | ORAL_TABLET | Freq: Every day | ORAL | 3 refills | Status: DC

## 2020-02-11 NOTE — Telephone Encounter (Signed)
    Patient calling, states she is ok with taking generic Lipitor or Crestor.

## 2020-02-11 NOTE — Telephone Encounter (Signed)
Ok to ask pt if she is ok with change to generic lipitor or crestor, as these work a little better

## 2020-02-11 NOTE — Telephone Encounter (Signed)
Ok for lipitor 10 mg - done erx

## 2020-02-11 NOTE — Telephone Encounter (Signed)
New message:   I basically have told the patient what has been said about getting some assistance with this medication. Can you please advise on how she needs to fill this paperwork out for assistance. I will be more than happy to call the patient back with what the next steps are for this process if you like. Please advise.

## 2020-02-12 ENCOUNTER — Telehealth: Payer: Self-pay | Admitting: Internal Medicine

## 2020-02-12 NOTE — Telephone Encounter (Signed)
Lvm notifying pt rx is done.

## 2020-02-12 NOTE — Telephone Encounter (Signed)
Will forward to my basket to f/u on when I RTC 02/13/20

## 2020-02-13 ENCOUNTER — Other Ambulatory Visit: Payer: Self-pay | Admitting: Gastroenterology

## 2020-02-13 ENCOUNTER — Ambulatory Visit (INDEPENDENT_AMBULATORY_CARE_PROVIDER_SITE_OTHER): Payer: PPO

## 2020-02-13 DIAGNOSIS — Z1159 Encounter for screening for other viral diseases: Secondary | ICD-10-CM | POA: Diagnosis not present

## 2020-02-13 MED ORDER — MOMETASONE FURO-FORMOTEROL FUM 100-5 MCG/ACT IN AERO: 2.0000 | INHALATION_SPRAY | Freq: Two times a day (BID) | RESPIRATORY_TRACT | 3 refills | Status: DC

## 2020-02-13 NOTE — Telephone Encounter (Signed)
Form placed in Dr Gustavus Bryant lookat to be signed along with Rx and interoffice envelope addressed to Ray at [employer] so that this can be mailed certified this time.

## 2020-02-14 LAB — SARS CORONAVIRUS 2 (TAT 6-24 HRS): SARS Coronavirus 2: NEGATIVE

## 2020-02-17 ENCOUNTER — Other Ambulatory Visit: Payer: Self-pay

## 2020-02-17 ENCOUNTER — Ambulatory Visit (AMBULATORY_SURGERY_CENTER): Payer: PPO | Admitting: Gastroenterology

## 2020-02-17 ENCOUNTER — Encounter: Payer: Self-pay | Admitting: Gastroenterology

## 2020-02-17 VITALS — BP 102/57 | HR 80 | Temp 96.6°F | Resp 19 | Ht 62.0 in | Wt 197.0 lb

## 2020-02-17 DIAGNOSIS — K3189 Other diseases of stomach and duodenum: Secondary | ICD-10-CM

## 2020-02-17 DIAGNOSIS — K222 Esophageal obstruction: Secondary | ICD-10-CM | POA: Diagnosis not present

## 2020-02-17 DIAGNOSIS — K219 Gastro-esophageal reflux disease without esophagitis: Secondary | ICD-10-CM

## 2020-02-17 DIAGNOSIS — R142 Eructation: Secondary | ICD-10-CM | POA: Diagnosis not present

## 2020-02-17 DIAGNOSIS — K317 Polyp of stomach and duodenum: Secondary | ICD-10-CM | POA: Diagnosis not present

## 2020-02-17 DIAGNOSIS — K449 Diaphragmatic hernia without obstruction or gangrene: Secondary | ICD-10-CM

## 2020-02-17 MED ORDER — SODIUM CHLORIDE 0.9 % IV SOLN: 500.0000 mL | Freq: Once | INTRAVENOUS | Status: DC

## 2020-02-17 NOTE — Op Note (Signed)
[facility] Patient Name: Andrea Mason Procedure Date: 02/17/2020 3:59 PM MRN: [id number] Endoscopist: Remo Lipps P. Havery Moros , MD Age: 69 Referring MD:  Date of Birth: Apr 15, 1951 Gender: Female Account #: [id number] Procedure:                Upper GI endoscopy Indications:              Follow-up of chronic gastro-esophageal reflux                            disease - rule out Barrett's, frequent belching                            which is new - improved with Gas-ex, on chronic PPI Medicines:                Monitored Anesthesia Care Procedure:                Pre-Anesthesia Assessment:                           - Prior to the procedure, a History and Physical                            was performed, and patient medications and                            allergies were reviewed. The patient's tolerance of                            previous anesthesia was also reviewed. The risks                            and benefits of the procedure and the sedation                            options and risks were discussed with the patient.                            All questions were answered, and informed consent                            was obtained. Prior Anticoagulants: The patient has                            taken no previous anticoagulant or antiplatelet                            agents. ASA Grade Assessment: III - A patient with                            severe systemic disease. After reviewing the risks                            and benefits, the patient was deemed in  satisfactory condition to undergo the procedure.                           After obtaining informed consent, the endoscope was                            passed under direct vision. Throughout the                            procedure, the patient's blood pressure, pulse, and                            oxygen saturations were monitored continuously. The   Endoscope was introduced through the mouth, and                            advanced to the second part of duodenum. The upper                            GI endoscopy was accomplished without difficulty.                            The patient tolerated the procedure well. Scope In: Scope Out: Findings:                 Esophagogastric landmarks were identified: the                            Z-line was found at 34 cm, the gastroesophageal                            junction was found at 34 cm and the upper extent of                            the gastric folds was found at 35 cm from the                            incisors.                           A 1 cm hiatal hernia was present.                           One benign-appearing widely patent Shatski ring was                            found 34 cm from the incisors, no dilation                            performed.                           The exam of the esophagus was otherwise normal.  A single 7 to 8 mm sessile polyp was found in the                            cardia at the end of the hernia sac which appeared                            to be ball valving in and out of the hernia sac.                            The polyp was removed with a hot snare. Resection                            and retrieval were complete.                           Multiple medium sessile polyps were found in the                            gastric fundus and in the gastric body, consistent                            with benign fundic gland or hyperplastic polyps.                            Given the cardia polyp was removed, no further                            biopsies / sampling done, all benign appearing.                           The exam of the stomach was otherwise normal.                           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 Biopsies were taken with a cold                            forceps for histology to ensure no adenomatous                            change,.                           The exam of the duodenum was otherwise normal. Complications:            No immediate complications. Estimated blood loss:  Minimal. Estimated Blood Loss:     Estimated blood loss was minimal. Impression:               - Esophagogastric landmarks identified.                           - 1 cm hiatal hernia.                           - Widely patient mild Shatski ring                           - A single gastric polyp at the cardia as described                            above. Resected and retrieved.                           - Multiple gastric polyps, benign appearing.                           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 Biopsied                            to ensure no adenomatous changes.                           - Normal duodenum otherwise. Recommendation:           - Patient has a contact number available for                            emergencies. The signs and symptoms of potential                            delayed complications were discussed with the                            patient. Return to normal activities tomorrow.                            Written discharge instructions were provided to the                            patient.                           - Resume previous diet.                           - Continue present medications.                           - Await pathology results. Remo Lipps P. Havery Moros, MD 02/17/2020 4:25:23 PM This report has  been signed electronically.

## 2020-02-17 NOTE — Progress Notes (Signed)
pt tolerated well. VSS. awake and to recovery. Report given to RN.  

## 2020-02-17 NOTE — Progress Notes (Signed)
Temp by LC vitals by DT   Pt's states no medical or surgical changes since previsit or office visit.  

## 2020-02-17 NOTE — Patient Instructions (Addendum)
YOU HAD AN ENDOSCOPIC PROCEDURE TODAY AT [facility]:   Refer to the procedure report that was given to you for any specific questions about what was found during the examination.  If the procedure report does not answer your questions, please call your gastroenterologist to clarify.  If you requested that your care partner not be given the details of your procedure findings, then the procedure report has been included in a sealed envelope for you to review at your convenience later.  YOU SHOULD EXPECT: Some feelings of bloating in the abdomen. Passage of more gas than usual.  Walking can help get rid of the air that was put into your GI tract during the procedure and reduce the bloating.   Please Note:  You might notice some irritation and congestion in your nose or some drainage.  This is from the oxygen used during your procedure.  There is no need for concern and it should clear up in a day or so.  SYMPTOMS TO REPORT IMMEDIATELY:  Following upper endoscopy (EGD)  Vomiting of blood or coffee ground material  New chest pain or pain under the shoulder blades  Painful or persistently difficult swallowing  New shortness of breath  Fever of 100F or higher  Black, tarry-looking stools  For urgent or emergent issues, a gastroenterologist can be reached at any hour by calling [phone]. Do not use MyChart messaging for urgent concerns.    DIET:  We do recommend a small meal at first, but then you may proceed to your regular diet.  Drink plenty of fluids but you should avoid alcoholic beverages for 24 hours.  ACTIVITY:  You should plan to take it easy for the rest of today and you should NOT DRIVE or use heavy machinery until tomorrow (because of the sedation medicines used during the test).    FOLLOW UP: Our staff will call the number listed on your records 48-72 hours following your procedure to check on you and address any questions or concerns that you may have regarding  the information given to you following your procedure. If we do not reach you, we will leave a message.  We will attempt to reach you two times.  During this call, we will ask if you have developed any symptoms of COVID 19. If you develop any symptoms (ie: fever, flu-like symptoms, shortness of breath, cough etc.) before then, please call [phone].  If you test positive for Covid 19 in the 2 weeks post procedure, please call and report this information to [place].    If any biopsies were taken you will be contacted by phone or by letter within the next 1-3 weeks.  Please call us at [phone] if you have not heard about the biopsies in 3 weeks.  SIGNATURES/CONFIDENTIALITY: You and/or your care partner have signed paperwork which will be entered into your electronic medical record.  These signatures attest to the fact that that the information above on your After Visit Summary has been reviewed and is understood.  Full responsibility of the confidentiality of this discharge information lies with you and/or your care-partner.  Await pathology  Please read over handout about hiatal hernias  Continue your normal medications  Avoid NSAIDS for now- Advil, Ibuprofen, Aleve, Motrin- Tylenol is ok

## 2020-02-17 NOTE — Progress Notes (Signed)
Called to room to assist during endoscopic procedure.  Patient ID and intended procedure confirmed with present staff. Received instructions for my participation in the procedure from the performing physician.  

## 2020-02-17 NOTE — Progress Notes (Signed)
Per Dr. Havery Moros, pt to avoid NSAIDs

## 2020-02-18 NOTE — Telephone Encounter (Signed)
I completed this and sent to [facility] in the Mail services to be mailed certified

## 2020-02-18 NOTE — Telephone Encounter (Signed)
Dr. Melvyn Novas, has the form been completed for patient?  Thanks.

## 2020-02-19 ENCOUNTER — Telehealth: Payer: Self-pay | Admitting: *Deleted

## 2020-02-19 NOTE — Telephone Encounter (Signed)
  Follow up Call-  Call back number 02/17/2020 07/02/2019  Post procedure Call Back phone  # [phone]  Permission to leave phone message Yes Yes  Some recent data might be hidden     Patient questions:  Do you have a fever, pain , or abdominal swelling? No. Pain Score  0 *  Have you tolerated food without any problems? Yes.    Have you been able to return to your normal activities? Yes.    Do you have any questions about your discharge instructions: Diet   No. Medications  No. Follow up visit  No.  Do you have questions or concerns about your Care? No.  Actions: * If pain score is 4 or above: No action needed, pain <4.  1. Have you developed a fever since your procedure? no  2.   Have you had an respiratory symptoms (SOB or cough) since your procedure? no  3.   Have you tested positive for COVID 19 since your procedure non  4.   Have you had any family members/close contacts diagnosed with the COVID 19 since your procedure?  no   If yes to any of these questions please route to Joylene John, RN and Alphonsa Gin, Therapist, sports.

## 2020-02-19 NOTE — Telephone Encounter (Signed)
  Follow up Call-  Call back number 02/17/2020 07/02/2019  Post procedure Call Back phone  # [phone]  Permission to leave phone message Yes Yes  Some recent data might be hidden     Patient questions:   Message if to call us if necessary.

## 2020-03-04 ENCOUNTER — Ambulatory Visit: Payer: PPO | Attending: Internal Medicine

## 2020-03-04 DIAGNOSIS — Z23 Encounter for immunization: Secondary | ICD-10-CM

## 2020-03-04 NOTE — Progress Notes (Signed)
   Covid-19 Vaccination Clinic  Name:  Andrea Mason    MRN: [id number] DOB: 1951/03/14  03/04/2020  Andrea Mason was observed post Covid-19 immunization for 15 minutes without incident. She was provided with Vaccine Information Sheet and instruction to access the V-Safe system.   Andrea Mason was instructed to call 911 with any severe reactions post vaccine: Marland Kitchen Difficulty breathing  . Swelling of face and throat  . A fast heartbeat  . A bad rash all over body  . Dizziness and weakness   Immunizations Administered    Name Date Dose VIS Date Route   Pfizer COVID-19 Vaccine 03/04/2020  3:14 PM 0.3 mL 11/14/2019 Intramuscular   Manufacturer: [place]   Lot: [id number]   [place]: [id number]

## 2020-03-29 ENCOUNTER — Ambulatory Visit: Payer: PPO | Attending: Internal Medicine

## 2020-03-29 DIAGNOSIS — Z23 Encounter for immunization: Secondary | ICD-10-CM

## 2020-03-29 NOTE — Progress Notes (Signed)
   Covid-19 Vaccination Clinic  Name:  Andrea Mason    MRN: [id number] DOB: [birth year]  03/29/2020  [name] was observed post Covid-19 immunization for 30 minutes based on pre-vaccination screening without incident. She was provided with Vaccine Information Sheet and instruction to access the V-Safe system.   [name] was instructed to call 911 with any severe reactions post vaccine: Marland Kitchen Difficulty breathing  . Swelling of face and throat  . A fast heartbeat  . A bad rash all over body  . Dizziness and weakness   Immunizations Administered    Name Date Dose VIS Date Route   Pfizer COVID-19 Vaccine 03/29/2020  2:39 PM 0.3 mL 01/28/2019 Intramuscular   Manufacturer: [place]   Lot: [id number]   [place]: [id number]

## 2020-03-30 ENCOUNTER — Telehealth: Payer: Self-pay | Admitting: Internal Medicine

## 2020-03-30 MED ORDER — DARIFENACIN HYDROBROMIDE ER 7.5 MG PO TB24
7.5000 mg | ORAL_TABLET | Freq: Every day | ORAL | 3 refills | Status: DC
Start: 2020-03-30 — End: 2020-10-15

## 2020-03-30 NOTE — Telephone Encounter (Signed)
Hmmmm, please ask pt to ask at her pharmacy about which medication is preferred under her insurance, as there are about 4 or 5 or them, and we dont want to keep guessing.

## 2020-03-30 NOTE — Telephone Encounter (Signed)
[place] for eneblex - done erx

## 2020-03-30 NOTE — Telephone Encounter (Signed)
    Pt c/o medication issue:  1. Name of Medication: fesoterodine (TOVIAZ) 8 MG TB24 tablet  2. How are you currently taking this medication (dosage and times per day)? As written  3. Are you having a reaction (difficulty breathing--STAT)? no  4. What is your medication issue? Patient calling to report medication too costly, looking for alternative

## 2020-03-31 ENCOUNTER — Ambulatory Visit (INDEPENDENT_AMBULATORY_CARE_PROVIDER_SITE_OTHER): Payer: PPO | Admitting: Podiatry

## 2020-03-31 ENCOUNTER — Other Ambulatory Visit: Payer: Self-pay

## 2020-03-31 ENCOUNTER — Encounter: Payer: Self-pay | Admitting: Podiatry

## 2020-03-31 VITALS — Temp 98.4°F

## 2020-03-31 DIAGNOSIS — B351 Tinea unguium: Secondary | ICD-10-CM

## 2020-03-31 DIAGNOSIS — M79671 Pain in right foot: Secondary | ICD-10-CM | POA: Diagnosis not present

## 2020-03-31 DIAGNOSIS — M79672 Pain in left foot: Secondary | ICD-10-CM | POA: Diagnosis not present

## 2020-03-31 DIAGNOSIS — L84 Corns and callosities: Secondary | ICD-10-CM

## 2020-03-31 DIAGNOSIS — M79676 Pain in unspecified toe(s): Secondary | ICD-10-CM

## 2020-03-31 NOTE — Patient Instructions (Signed)

## 2020-03-31 NOTE — Progress Notes (Signed)
Subjective: Andrea Mason presents today for follow up of painful mycotic nails b/l that are difficult to trim. Pain interferes with ambulation. Aggravating factors include wearing enclosed shoe gear. Pain is relieved with periodic professional debridement and painful callus(es) plantar aspect of both feet. Aggravating factors include weightbearing with and without shoe gear. Pain is relieved with periodic professional debridement.   She voices no new pedal concerns on today's visit.  Allergies  Allergen Reactions  . Ivp Dye [Iodinated Diagnostic Agents] Nausea And Vomiting and Other (See Comments)    hot flashes  . Promethazine-Codeine Nausea And Vomiting  . Tramadol Other (See Comments)    Almost passed out    Objective: Vitals:   03/31/20 1444  Temp: 98.4 F (36.9 C)    Pt is a pleasant 69 y.o. year old AA  female  in NAD. AAO x 3.   Vascular Examination:  Capillary refill time to digits immediate b/l. Palpable DP pulses b/l. Palpable PT pulses b/l. Pedal hair present b/l. Skin temperature gradient within normal limits b/l. No edema noted b/l.  Dermatological Examination: Pedal skin with normal turgor, texture and tone bilaterally. No open wounds bilaterally. No interdigital macerations bilaterally. Toenails 1-5 b/l elongated, dystrophic, thickened, crumbly with subungual debris and tenderness to dorsal palpation. Hyperkeratotic lesion(s) L hallux, R hallux, submet head 1 right foot, submet head 2 left foot and submet head 5 right foot.  No erythema, no edema, no drainage, no flocculence.  Musculoskeletal: Normal muscle strength 5/5 to all lower extremity muscle groups bilaterally. No pain crepitus or joint limitation noted with ROM b/l. Hallux valgus with bunion deformity noted b/l.  Neurological: Protective sensation intact 5/5 intact bilaterally with 10g monofilament b/l. Vibratory sensation intact b/l. Proprioception intact bilaterally. Babinski reflex negative b/l. Clonus  negative b/l.  Assessment: 1. Pain due to onychomycosis of toenail   2. Pain in both feet   3. Callus    Plan: -Toenails 1-5 b/l were debrided in length and girth with sterile nail nippers and dremel without iatrogenic bleeding.  -Callus(es) L hallux, R hallux, submet head 1 right foot, submet head 2 left foot and submet head 5 right foot were debrided utilizing sterile scalpel blade without complication or incident. Total number debrided =5. -Patient to continue soft, supportive shoe gear daily. -Patient to report any pedal injuries to medical professional immediately. -Patient/POA to call should there be question/concern in the interim.  Return in about 3 months (around 06/30/2020).

## 2020-03-31 NOTE — Telephone Encounter (Signed)
    Patient made aware medication called to pharmacy 

## 2020-04-01 ENCOUNTER — Telehealth: Payer: Self-pay | Admitting: Internal Medicine

## 2020-04-01 NOTE — Telephone Encounter (Signed)
Call made to patient, made aware we received her message and will route this information to Dr. Melvyn Novas and get back with her. Voiced understanding.   Dr. Melvyn Novas, please advise. Thanks

## 2020-04-01 NOTE — Telephone Encounter (Signed)
These are not approp subsitutes  Ov with NP with all meds in hand and drug formulary unless dulera 100 is availab (rx is  Take 2 puffs first thing in am and then another 2 puffs about 12 hours later)

## 2020-04-02 NOTE — Telephone Encounter (Signed)
Pt called back about this please return call anytime today

## 2020-04-02 NOTE — Telephone Encounter (Addendum)
    Patient calling,  prior auth needed for Enablex. Patient requesting call back.

## 2020-04-02 NOTE — Telephone Encounter (Signed)
LMTCB x1 for pt.  

## 2020-04-05 NOTE — Telephone Encounter (Signed)
LMTCB x2  

## 2020-04-06 NOTE — Telephone Encounter (Signed)
Spoke with pt, aware of recs.  appt scheduled to review meds.  Nothing further needed at this time- will close encounter.

## 2020-04-09 ENCOUNTER — Other Ambulatory Visit: Payer: Self-pay

## 2020-04-09 ENCOUNTER — Ambulatory Visit: Payer: PPO | Admitting: Adult Health

## 2020-04-09 ENCOUNTER — Encounter: Payer: Self-pay | Admitting: Adult Health

## 2020-04-09 DIAGNOSIS — J45991 Cough variant asthma: Secondary | ICD-10-CM

## 2020-04-09 DIAGNOSIS — J328 Other chronic sinusitis: Secondary | ICD-10-CM | POA: Diagnosis not present

## 2020-04-09 MED ORDER — AMOXICILLIN-POT CLAVULANATE 875-125 MG PO TABS
1.0000 | ORAL_TABLET | Freq: Two times a day (BID) | ORAL | 0 refills | Status: DC
Start: 2020-04-09 — End: 2020-09-09

## 2020-04-09 NOTE — Progress Notes (Signed)
@Patient  ID: Andrea Mason, female    DOB: 28-Jul-1951, 69 y.o.   MRN: [id number]    Referring provider: Biagio Borg, MD  HPI: 69 year old female never smoker followed for allergic rhinitis and asthma  TEST/EVENTS :  Spirometry 12/11/2014  Min abn mid flows only   04/13/2020 Follow up : Asthma and AR  Patient returns for a 21-month follow-up.  She says she had been doing well up until the last 2 weeks when she started developing increased nasal congestion, sinus pain and pressure.  She has had green nasal discharge.and mild cough.  She denies any chest pain orthopnea PND or increased leg swelling.  Patient does remain on Symbicort for her asthma.  But has difficulty affording this.  We discussed patient assistance for this. We went over her medications with patient education.  And updated her medication calendar.  Allergies  Allergen Reactions  . Ivp Dye [Iodinated Diagnostic Agents] Nausea And Vomiting and Other (See Comments)    hot flashes  . Promethazine-Codeine Nausea And Vomiting  . Tramadol Other (See Comments)    Almost passed out    Immunization History  Administered Date(s) Administered  . Influenza Split 09/03/2012  . Influenza Whole 09/27/2006, 08/23/2007, 11/16/2009, 08/25/2010  . Influenza,inj,Quad PF,6+ Mos 08/21/2013, 08/06/2014, 10/05/2015  . PFIZER SARS-COV-2 Vaccination 03/04/2020, 03/29/2020  . Pneumococcal Conjugate-13 12/02/2014  . Pneumococcal Polysaccharide-23 06/03/2019  . Tdap 05/06/2013, 08/25/2015  . Zoster 05/06/2013    Past Medical History:  Diagnosis Date  . Allergic rhinitis   . Allergy   . Asthma   . Depression   . DJD (degenerative joint disease), cervical   . GERD (gastroesophageal reflux disease)   . History of colonic polyps   . Hypertension   . Obesity   . Primary hyperaldosteronism ([place]) 11/06/2013  . Stroke ([place])   . Syncope     Tobacco History: Social History   Tobacco Use  Smoking Status Never Smoker  Smokeless  Tobacco Never Used   Counseling given: Not Answered   Outpatient Medications Prior to Visit  Medication Sig Dispense Refill  . aspirin 325 MG tablet Take 325 mg by mouth daily.    Marland Kitchen atorvastatin (LIPITOR) 10 MG tablet Take 1 tablet (10 mg total) by mouth daily. 90 tablet 3  . Biotin 10000 MCG TABS Take 10,000 mcg by mouth daily.    . budesonide-formoterol (SYMBICORT) 80-4.5 MCG/ACT inhaler Inhale 2 puffs into the lungs 2 (two) times daily. 1 Inhaler 11  . cetirizine (ZYRTEC ALLERGY) 10 MG tablet Take 10 mg by mouth at bedtime.     . Cholecalciferol (VITAMIN D3) 2000 units TABS Take 2,000 Units by mouth daily.    Marland Kitchen darifenacin (ENABLEX) 7.5 MG 24 hr tablet Take 1 tablet (7.5 mg total) by mouth daily. 90 tablet 3  . dextromethorphan-guaiFENesin (MUCINEX DM) 30-600 MG 12hr tablet Take 1-2 tablets by mouth 2 (two) times daily as needed (for congestion/cough (WITH FLUTTER VALVE)).     . famotidine (PEPCID) 20 MG tablet Take 1 tablet (20 mg total) by mouth at bedtime. 90 tablet 3  . fesoterodine (TOVIAZ) 8 MG TB24 tablet Take 1 tablet (8 mg total) by mouth daily. 90 tablet 3  . montelukast (SINGULAIR) 10 MG tablet Take 1 tablet (10 mg total) by mouth at bedtime. 30 tablet 4  . oxymetazoline (AFRIN) 0.05 % nasal spray Place 2 sprays into both nostrils 2 (two) times daily as needed (nasal stuffiness and nasal pressure for 5 days).     Marland Kitchen  pantoprazole (PROTONIX) 40 MG tablet Take 1 tablet (40 mg total) by mouth 2 (two) times daily before a meal. 180 tablet 1  . simethicone (MYLICON) 0000000 MG chewable tablet Chew 125 mg by mouth every 6 (six) hours as needed for flatulence.    . sodium chloride (OCEAN) 0.65 % SOLN nasal spray Place 2 sprays into both nostrils as needed (dry nose).     Marland Kitchen spironolactone (ALDACTONE) 25 MG tablet Take 25 mg by mouth daily.     Marland Kitchen triamcinolone (NASACORT) 55 MCG/ACT AERO nasal inhaler Place 2 sprays into the nose daily. 1 Inhaler 12  . triamcinolone cream (KENALOG) 0.1 % Apply  topically.    . mometasone-formoterol (DULERA) 100-5 MCG/ACT AERO Inhale 2 puffs into the lungs 2 (two) times daily. 39 g 3   No facility-administered medications prior to visit.     Review of Systems:   Constitutional:   No  weight loss, night sweats,  Fevers, chills,  +fatigue, or  lassitude.  HEENT:   No headaches,  Difficulty swallowing,  Tooth/dental problems, or  Sore throat,                No sneezing, itching, ear ache,  +nasal congestion, post nasal drip,   CV:  No chest pain,  Orthopnea, PND, swelling in lower extremities, anasarca, dizziness, palpitations, syncope.   GI  No heartburn, indigestion, abdominal pain, nausea, vomiting, diarrhea, change in bowel habits, loss of appetite, bloody stools.   Resp:    No chest wall deformity  Skin: no rash or lesions.  GU: no dysuria, change in color of urine, no urgency or frequency.  No flank pain, no hematuria   MS:  No joint pain or swelling.  No decreased range of motion.  No back pain.    Physical Exam  Pulse 82   Ht 5\' 2"  (1.575 m)   Wt 201 lb (91.2 kg)   LMP  (LMP Unknown)   SpO2 98%   BMI 36.76 kg/m   GEN: A/Ox3; pleasant , NAD    HEENT:  [place]/AT,    NOSE-clear drainage , THROAT-clear, no lesions, no postnasal drip or exudate noted.   NECK:  Supple w/ fair ROM; no JVD; normal carotid impulses w/o bruits; no thyromegaly or nodules palpated; no lymphadenopathy.    RESP  Clear  P & A; w/o, wheezes/ rales/ or rhonchi. no accessory muscle use, no dullness to percussion  CARD:  RRR, no m/r/g, no peripheral edema, pulses intact, no cyanosis or clubbing.  GI:   Soft & nt; nml bowel sounds; no organomegaly or masses detected.   Musco: Warm bil, no deformities or joint swelling noted.   Neuro: alert, no focal deficits noted.    Skin: Warm, no lesions or rashes    Lab Results:  CBC  BNP No results found for: BNP  ProBNP No results found for: PROBNP  Imaging: No results found.    No flowsheet data  found.  No results found for: NITRICOXIDE      Assessment & Plan:   No problem-specific Assessment & Plan notes found for this encounter.     Rexene Edison, NP 04/09/2020

## 2020-04-09 NOTE — Patient Instructions (Addendum)
Augmentin 875mg  Twice daily  For 10 days , take with food.  Saline nasal rinses As needed.  Patient assistance paperwork for Symbicort .  Mucinex DM Twice daily  As needed  Cough/congestion  Follow med calendar closely and bring to each visit.  Follow up Dr. Melvyn Novas  In 3-4  months and As needed   Please contact office for sooner follow up if symptoms do not improve or worsen or seek emergency care

## 2020-04-13 NOTE — Assessment & Plan Note (Signed)
Flare   Plan  Patient Instructions  Augmentin 875mg  Twice daily  For 10 days , take with food.  Saline nasal rinses As needed.  Patient assistance paperwork for Symbicort .  Mucinex DM Twice daily  As needed  Cough/congestion  Follow med calendar closely and bring to each visit.  Follow up Dr. Melvyn Novas  In 3-4  months and As needed   Please contact office for sooner follow up if symptoms do not improve or worsen or seek emergency care

## 2020-04-13 NOTE — Assessment & Plan Note (Signed)
Currently stable  Patient assistance paperwork for Symbicort  Plan  Patient Instructions  Augmentin 875mg  Twice daily  For 10 days , take with food.  Saline nasal rinses As needed.  Patient assistance paperwork for Symbicort .  Mucinex DM Twice daily  As needed  Cough/congestion  Follow med calendar closely and bring to each visit.  Follow up Dr. Melvyn Novas  In 3-4  months and As needed   Please contact office for sooner follow up if symptoms do not improve or worsen or seek emergency care

## 2020-04-14 NOTE — Addendum Note (Signed)
Addended by: Desmond Dike C on: 04/14/2020 09:14 AM   Modules accepted: Orders

## 2020-04-16 ENCOUNTER — Other Ambulatory Visit: Payer: Self-pay | Admitting: Internal Medicine

## 2020-04-16 MED ORDER — BUDESONIDE-FORMOTEROL FUMARATE 80-4.5 MCG/ACT IN AERO: 2.0000 | INHALATION_SPRAY | Freq: Two times a day (BID) | RESPIRATORY_TRACT | 3 refills | Status: DC

## 2020-05-13 ENCOUNTER — Ambulatory Visit: Payer: PPO | Admitting: Internal Medicine

## 2020-05-19 ENCOUNTER — Other Ambulatory Visit: Payer: Self-pay

## 2020-05-19 DIAGNOSIS — J45991 Cough variant asthma: Secondary | ICD-10-CM

## 2020-05-19 MED ORDER — MONTELUKAST SODIUM 10 MG PO TABS: 10.0000 mg | ORAL_TABLET | Freq: Every day | ORAL | 4 refills | Status: DC

## 2020-06-11 ENCOUNTER — Telehealth: Payer: Self-pay | Admitting: Internal Medicine

## 2020-06-11 DIAGNOSIS — I10 Essential (primary) hypertension: Secondary | ICD-10-CM | POA: Diagnosis not present

## 2020-06-11 MED ORDER — NYSTATIN 100000 UNIT/ML MT SUSP
5.0000 mL | Freq: Four times a day (QID) | OROMUCOSAL | 0 refills | Status: DC
Start: 2020-06-11 — End: 2020-06-30

## 2020-06-11 NOTE — Telephone Encounter (Signed)
Called and spoke with pt letting her know the info stated by Beth and she verbalized understanding. Nothing further needed. 

## 2020-06-11 NOTE — Telephone Encounter (Signed)
Sent in RX for nystatin

## 2020-06-11 NOTE — Telephone Encounter (Signed)
Spoke with the pt  She states has a white coating on her tongue and her throat feels sore  Her mouth feels sore when she eats and it feels slightly painful to swallow  Symptoms started 2-3 days ago  She states has had thrush in the past and thinks this is what is going on  Would like something called in  Note that she does state that she rinses mouth after inhaler  Last abx was augmentin May 2021  Please advise thanks

## 2020-06-14 ENCOUNTER — Other Ambulatory Visit: Payer: Self-pay | Admitting: Internal Medicine

## 2020-06-15 DIAGNOSIS — E269 Hyperaldosteronism, unspecified: Secondary | ICD-10-CM | POA: Diagnosis not present

## 2020-06-15 DIAGNOSIS — I10 Essential (primary) hypertension: Secondary | ICD-10-CM | POA: Diagnosis not present

## 2020-06-30 ENCOUNTER — Encounter: Payer: Self-pay | Admitting: Podiatry

## 2020-06-30 ENCOUNTER — Other Ambulatory Visit: Payer: Self-pay

## 2020-06-30 ENCOUNTER — Ambulatory Visit (INDEPENDENT_AMBULATORY_CARE_PROVIDER_SITE_OTHER): Payer: PPO | Admitting: Podiatry

## 2020-06-30 DIAGNOSIS — B351 Tinea unguium: Secondary | ICD-10-CM

## 2020-06-30 DIAGNOSIS — M79676 Pain in unspecified toe(s): Secondary | ICD-10-CM

## 2020-07-03 NOTE — Progress Notes (Signed)
Subjective: Andrea Mason presents today for follow up of painful thick toenails that are difficult to trim. Pain interferes with ambulation. Aggravating factors include wearing enclosed shoe gear. Pain is relieved with periodic professional debridement.   She voices no new pedal concerns on today's visit.  Allergies  Allergen Reactions  . Ivp Dye [Iodinated Diagnostic Agents] Nausea And Vomiting and Other (See Comments)    hot flashes  . Promethazine-Codeine Nausea And Vomiting  . Tramadol Other (See Comments)    Almost passed out    Objective: There were no vitals filed for this visit.  Pt is a pleasant 69 y.o. year old AA  female  in NAD. AAO x 3.   Vascular Examination:  Capillary refill time to digits immediate b/l. Palpable DP pulses b/l. Palpable PT pulses b/l. Pedal hair present b/l. Skin temperature gradient within normal limits b/l. No edema noted b/l.  Dermatological Examination: Pedal skin with normal turgor, texture and tone bilaterally. No open wounds bilaterally. No interdigital macerations bilaterally. Toenails 1-5 b/l elongated, dystrophic, thickened, crumbly with subungual debris and tenderness to dorsal palpation.  Musculoskeletal: Normal muscle strength 5/5 to all lower extremity muscle groups bilaterally. No pain crepitus or joint limitation noted with ROM b/l. Hallux valgus with bunion deformity noted b/l.  Neurological: Protective sensation intact 5/5 intact bilaterally with 10g monofilament b/l. Vibratory sensation intact b/l. Proprioception intact bilaterally. Babinski reflex negative b/l. Clonus negative b/l.  Assessment: 1. Pain due to onychomycosis of toenail    Plan: -Toenails 1-5 b/l were debrided in length and girth with sterile nail nippers and dremel without iatrogenic bleeding.  -Patient to continue soft, supportive shoe gear daily. -Patient to report any pedal injuries to medical professional immediately. -Patient/POA to call should there be  question/concern in the interim.  Return in about 3 months (around 09/30/2020).

## 2020-07-14 ENCOUNTER — Telehealth: Payer: Self-pay | Admitting: Internal Medicine

## 2020-07-14 NOTE — Telephone Encounter (Signed)
Called and spoke with pt who stated she has developed thrush on her tongue which she stated she believes is coming from the [place]. Pt wants to know if nystatin could be called in to help with the thrush. Dr. Melvyn Novas, please advise.

## 2020-07-15 MED ORDER — CLOTRIMAZOLE 10 MG MT TROC
10.0000 mg | Freq: Four times a day (QID) | OROMUCOSAL | 3 refills | Status: DC | PRN
Start: 2020-07-15 — End: 2020-09-07

## 2020-07-15 NOTE — Telephone Encounter (Signed)
Clotrimazole troche 10 mg qid prn give her 12 with 3 refills   Needs to be seen q 3 m by me or NP's if wants to keep getting phone care as she is quite complicated to try to treat over the phone

## 2020-07-15 NOTE — Telephone Encounter (Signed)
Called and spoke with patient, relayed information per Dr. Melvyn Novas.  Medication sent to patient pharmacy, pharmacy verified with patient.   Advised patient she needs to be seen either by Dr. Melvyn Novas or a NP every 3 months d/t the complexity of her illness.  She verbalized understanding.  She was in the car, so I was unable to schedule an appointment for her.  Advised patient to call the office to schedule an appointment.  She stated she would do so.  Nothing further needed.

## 2020-07-17 ENCOUNTER — Other Ambulatory Visit: Payer: Self-pay | Admitting: Primary Care

## 2020-07-18 ENCOUNTER — Other Ambulatory Visit: Payer: Self-pay | Admitting: Internal Medicine

## 2020-07-18 NOTE — Telephone Encounter (Signed)
Please refill as per office routine med refill policy (all routine meds refilled for 3 mo or monthly per pt preference up to one year from last visit, then month to month grace period for 3 mo, then further med refills will have to be denied)  

## 2020-07-28 ENCOUNTER — Telehealth: Payer: Self-pay | Admitting: Internal Medicine

## 2020-07-28 NOTE — Telephone Encounter (Signed)
Patient dropped off a renewal parking placard.   Form has been completed &Placed in providers box to review and sign.  

## 2020-08-03 NOTE — Telephone Encounter (Signed)
Form has been signed, copy sent to scan.   Patient has been informed. Mailed to patient as requested.

## 2020-08-09 ENCOUNTER — Emergency Department (HOSPITAL_COMMUNITY)
Admission: EM | Admit: 2020-08-09 | Discharge: 2020-08-10 | Disposition: A | Discharge status: Home / Self Care | Payer: PPO | Attending: Emergency Medicine | Admitting: Emergency Medicine

## 2020-08-09 ENCOUNTER — Emergency Department (HOSPITAL_COMMUNITY): Payer: PPO

## 2020-08-09 ENCOUNTER — Other Ambulatory Visit: Payer: Self-pay

## 2020-08-09 ENCOUNTER — Encounter (HOSPITAL_COMMUNITY): Payer: Self-pay | Admitting: Emergency Medicine

## 2020-08-09 DIAGNOSIS — R531 Weakness: Secondary | ICD-10-CM | POA: Insufficient documentation

## 2020-08-09 DIAGNOSIS — Z7982 Long term (current) use of aspirin: Secondary | ICD-10-CM | POA: Diagnosis not present

## 2020-08-09 DIAGNOSIS — Z7951 Long term (current) use of inhaled steroids: Secondary | ICD-10-CM | POA: Insufficient documentation

## 2020-08-09 DIAGNOSIS — Z79899 Other long term (current) drug therapy: Secondary | ICD-10-CM | POA: Diagnosis not present

## 2020-08-09 DIAGNOSIS — R079 Chest pain, unspecified: Secondary | ICD-10-CM | POA: Diagnosis not present

## 2020-08-09 DIAGNOSIS — R42 Dizziness and giddiness: Secondary | ICD-10-CM | POA: Diagnosis not present

## 2020-08-09 DIAGNOSIS — R251 Tremor, unspecified: Secondary | ICD-10-CM | POA: Insufficient documentation

## 2020-08-09 DIAGNOSIS — R0789 Other chest pain: Secondary | ICD-10-CM | POA: Diagnosis not present

## 2020-08-09 DIAGNOSIS — J45909 Unspecified asthma, uncomplicated: Secondary | ICD-10-CM | POA: Diagnosis not present

## 2020-08-09 DIAGNOSIS — R109 Unspecified abdominal pain: Secondary | ICD-10-CM | POA: Diagnosis not present

## 2020-08-09 DIAGNOSIS — I129 Hypertensive chronic kidney disease with stage 1 through stage 4 chronic kidney disease, or unspecified chronic kidney disease: Secondary | ICD-10-CM | POA: Diagnosis not present

## 2020-08-09 DIAGNOSIS — N183 Chronic kidney disease, stage 3 unspecified: Secondary | ICD-10-CM | POA: Insufficient documentation

## 2020-08-09 DIAGNOSIS — R069 Unspecified abnormalities of breathing: Secondary | ICD-10-CM | POA: Diagnosis not present

## 2020-08-09 DIAGNOSIS — R29810 Facial weakness: Secondary | ICD-10-CM | POA: Diagnosis not present

## 2020-08-09 LAB — URINALYSIS, ROUTINE W REFLEX MICROSCOPIC
Bilirubin Urine: NEGATIVE
Glucose, UA: NEGATIVE mg/dL
Hgb urine dipstick: NEGATIVE
Ketones, ur: NEGATIVE mg/dL
Leukocytes,Ua: NEGATIVE
Nitrite: NEGATIVE
Protein, ur: NEGATIVE mg/dL
Specific Gravity, Urine: 1.003 — ABNORMAL LOW (ref 1.005–1.030)
pH: 6 (ref 5.0–8.0)

## 2020-08-09 LAB — CBC
HCT: 35.4 % — ABNORMAL LOW (ref 36.0–46.0)
Hemoglobin: 11.4 g/dL — ABNORMAL LOW (ref 12.0–15.0)
MCH: 29.2 pg (ref 26.0–34.0)
MCHC: 32.2 g/dL (ref 30.0–36.0)
MCV: 90.8 fL (ref 80.0–100.0)
Platelets: 194 10*3/uL (ref 150–400)
RBC: 3.9 MIL/uL (ref 3.87–5.11)
RDW: 12.7 % (ref 11.5–15.5)
WBC: 5.4 10*3/uL (ref 4.0–10.5)
nRBC: 0 % (ref 0.0–0.2)

## 2020-08-09 LAB — BASIC METABOLIC PANEL
Anion gap: 8 (ref 5–15)
BUN: 13 mg/dL (ref 8–23)
CO2: 26 mmol/L (ref 22–32)
Calcium: 9.3 mg/dL (ref 8.9–10.3)
Chloride: 103 mmol/L (ref 98–111)
Creatinine, Ser: 1.04 mg/dL — ABNORMAL HIGH (ref 0.44–1.00)
GFR calc Af Amer: >60 mL/min (ref >60)
GFR calc non Af Amer: 56 mL/min — ABNORMAL LOW (ref >60)
Glucose, Bld: 89 mg/dL (ref 70–99)
Potassium: 4.1 mmol/L (ref 3.5–5.1)
Sodium: 137 mmol/L (ref 135–145)

## 2020-08-09 NOTE — ED Triage Notes (Signed)
Pt BIB [facility], c/o left sided flank pain that radiated to her left chest while trying to open her car door. Hx CVA, residual RLE weakness. EMS reports pt had a tremor to her right arm that has since resolved. EMS VS: BP 121/88, CBG 106

## 2020-08-09 NOTE — ED Provider Notes (Signed)
[facility]ERGENC[facility] DEPARTMENT Provider Note   CSN: [id number] Arrival date & time: 08/09/20  1613     History Chief Complaint  Patient presents with  . Flank Pain    Andrea Mason is a 69 y.o. female, past medical history of hypertension, primary hyperaldosteronism, CKD, CVA with right-sided deficits presents the ED by EMS for evaluation of chest pain.  Patient states that she was attempting close a car more when she lifted her arm and felt a sharp pain underneath her left breast.  This radiated up into her chest and was worsened with movement of her left arm, improved with rest.  Not associate with shortness of breath or diaphoresis or nausea.  She states that on EMS arrival she was symptomatically improved but that she had a brief episode of right arm tremors which also spontaneously improved.  At this time she endorses no pain except for when she moves her left arm around.  The history is provided by the patient.  Illness Location:  Left chest/breast Quality:  Sharp pain Severity:  Moderate Onset quality:  Sudden Duration:  6 hours Timing:  Constant Progression:  Improving Chronicity:  New Context:  Lifting of her left arm Associated symptoms: chest pain   Associated symptoms: no abdominal pain, no cough, no fever, no headaches, no rash, no shortness of breath and no vomiting        Past Medical History:  Diagnosis Date  . Allergic rhinitis   . Allergy   . Asthma   . Depression   . DJD (degenerative joint disease), cervical   . GERD (gastroesophageal reflux disease)   . History of colonic polyps   . Hypertension   . Obesity   . Primary hyperaldosteronism ([place]) 11/06/2013  . Stroke ([place])   . Syncope     Patient Active Problem List   Diagnosis Date Noted  . CKD (chronic kidney disease) stage 3, GFR 30-59 ml/min 12/31/2019  . Painful legs and moving toes of right foot 12/16/2019  . Skin nodule 11/16/2019  . Nocturia 11/16/2019  . Low back  pain 11/16/2019  . Alopecia 01/31/2019  . Upper airway cough syndrome 06/14/2018  . Rash 05/30/2018  . Vitamin D deficiency 08/31/2017  . Facial contusion, subsequent encounter 03/12/2017  . Vertigo 03/12/2017  . Right rotator cuff tear 07/27/2016  . Right shoulder pain 07/11/2016  . Overactive bladder 01/19/2016  . Cough variant asthma 09/07/2015  . Burn 09/06/2015  . Vocal cord dysfunction 04/05/2015  . Asthma with exacerbation 04/05/2015  . Left knee pain 03/12/2015  . Right knee pain 03/12/2015  . Cough 12/08/2014  . Skin lumps, generalized 09/08/2014  . Toe pain, right 08/06/2014  . Pre-ulcerative corn or callous 08/06/2014  . Hammertoe 08/06/2014  . Swelling of joint, ankle, right 08/06/2014  . Pansinusitis 06/02/2014  . Cerebral thrombosis with cerebral infarction ([place]) 05/26/2014  . Weakness 05/25/2014  . Right arm pain 05/25/2014  . Numbness in right leg 05/25/2014  . Numbness and tingling of right arm 05/25/2014  . Embolic stroke ([place]) 49/70/2637  . 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 Alterations of sensations, late effect of cerebrovascular disease(438.6) 02/10/2014  . Cerebral infarction ([place]) 11/25/2013  . Hyperlipidemia 11/24/2013  . Syncope 11/20/2013  . Primary hyperaldosteronism ([place]) 11/06/2013  . Back pain 11/06/2013  . Hypokalemia 05/06/2013  . Family history of colon cancer 05/06/2013  . Lipoma 09/03/2012  . Hyperglycemia 08/15/2012  . Constipation 01/02/2012  . Preventative health care  05/24/2011  . 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 HYPERSOMNIA WITH SLEEP APNEA UNSPECIFIED 01/26/2011  . UTI (urinary tract infection) 11/15/2010  . NASAL POLYP 07/28/2010  . FIBROIDS, UTERUS 07/27/2010  . COMPUTERIZED TOMOGRAPHY, CHEST, ABNORMAL 09/16/2009  . Seasonal and perennial allergic rhinitis 05/29/2009  . PERIPHERAL EDEMA 05/28/2009  . Mild persistent chronic asthma without complication 70/62/3762  . LEG PAIN, LEFT  06/30/2008  . COLONIC POLYPS, HX OF 06/30/2008  . OBESITY 12/12/2007  . Depression 12/12/2007  . Essential hypertension 12/12/2007  . RHINOSINUSITIS, CHRONIC 12/12/2007  . GERD 12/12/2007  . ARTHRITIS 12/12/2007    Past Surgical History:  Procedure Laterality Date  . COLONOSCOPY    . FOOT SURGERY  1998  . 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 LOOP RECORDER REMOVAL N/A 10/02/2017   Procedure: LOOP RECORDER REMOVAL;  Surgeon: [name], MD;  Location: [facility];  Service: Cardiovascular;  Laterality: N/A;  . NASAL POLYP SURGERY  07/2006   x 2 with Dr. [name]  . NASAL SINUS SURGERY  07/2006   Dr. [name]  . TUBAL LIGATION       OB History   No obstetric history on file.     Family History  Problem Relation Age of Onset  . Colon cancer Brother   . Stroke Brother   . Arrhythmia Brother   . Cancer Mother   . Diabetes Sister   . Lung disease Son        Sarcoid vs ARDS related fibrosis, required 2 double lung transplants  . Congenital heart disease Brother   . Heart attack Brother   . Esophageal cancer Neg Hx   . Rectal cancer Neg Hx   . Stomach cancer Neg Hx     Social History   Tobacco Use  . Smoking status: Never Smoker  . Smokeless tobacco: Never Used  Vaping Use  . Vaping Use: Never used  Substance Use Topics  . Alcohol use: Yes    Comment: socially wine  . Drug use: No    Home Medications Prior to Admission medications   Medication Sig Start Date End Date Taking? Authorizing Provider  acetaminophen (TYLENOL) 325 MG tablet Take as directed per bottle instructions    [provider]  albuterol (VENTOLIN HFA) 108 (90 Base) MCG/ACT inhaler Inhale 1-2 puffs into the lungs every 6 (six) hours as needed for wheezing or shortness of breath. Patient not taking: Reported on 06/30/2020    [provider]  amoxicillin-clavulanate (AUGMENTIN) 875-125  MG tablet Take 1 tablet by mouth 2 (two) times daily. 04/09/20   Parrett, Fonnie Mu, NP  aspirin 325 MG tablet Take 325 mg by mouth daily.    [provider]  baclofen (LIORESAL) 10 MG tablet Take 5 mg by mouth every 6 (six) hours as needed for muscle spasms.    [provider]  Biotin 10000 MCG TABS Take 10,000 mcg by mouth daily.    [provider]  budesonide-formoterol (SYMBICORT) 80-4.5 MCG/ACT inhaler Inhale 2 puffs into the lungs 2 (two) times daily. 04/16/20   Tanda Rockers, MD  cetirizine (ZYRTEC ALLERGY) 10 MG tablet Take 10 mg by mouth at bedtime.     [provider]  Cholecalciferol (VITAMIN D3) 2000 units TABS Take 2,000 Units by mouth daily.    [provider]  clotrimazole (MYCELEX) 10 MG troche Take 1 tablet (10 mg total) by mouth 4 (  four) times daily as needed. 07/15/20   Tanda Rockers, MD  darifenacin (ENABLEX) 7.5 MG 24 hr tablet Take 1 tablet (7.5 mg total) by mouth daily. 03/30/20   Biagio Borg, MD  dextromethorphan-guaiFENesin [facility] DM) 30-600 MG 12hr tablet Take 1-2 tablets by mouth 2 (two) times daily as needed (for congestion/cough (WITH FLUTTER VALVE)).     [provider]  famotidine (PEPCID) 20 MG tablet Take 1 tablet (20 mg total) by mouth at bedtime. 12/31/19   Biagio Borg, MD  lidocaine (LIDODERM) 5 % Place 1 patch onto the skin daily. Apply to painful area of chest, remove & discard patch every 12hrs allowing 12hr without patch present 08/10/20   Renold Genta, MD  Magnesium 250 MG TABS Take 1 tablet by mouth daily.    [provider]  mometasone (NASONEX) 50 MCG/ACT nasal spray Place 2 sprays into the nose 2 (two) times daily.    [provider]  montelukast (SINGULAIR) 10 MG tablet Take 1 tablet (10 mg total) by mouth at bedtime. 05/19/20   Tanda Rockers, MD  oxymetazoline (AFRIN) 0.05 % nasal spray Place 2 sprays into both nostrils 2 (two) times daily as needed (nasal stuffiness and nasal  pressure for 5 days).     [provider]  pantoprazole (PROTONIX) 40 MG tablet TAKE 1 TABLET(40 MG) BY MOUTH DAILY 07/19/20   Biagio Borg, MD  Pitavastatin Calcium (LIVALO) 4 MG TABS Take 1 tablet by mouth daily.    [provider]  Polyethylene Glycol 3350 (MIRALAX PO) 1 capful daily as needed    [provider]  Respiratory Therapy Supplies (FLUTTER) DEVI Use as directed    [provider]  sodium chloride (OCEAN) 0.65 % SOLN nasal spray Place 2 sprays into both nostrils as needed (dry nose).     [provider]  spironolactone (ALDACTONE) 25 MG tablet Take 25 mg by mouth daily.     [provider]  triamcinolone (NASACORT) 55 MCG/ACT AERO nasal inhaler Place 2 sprays into the nose daily. 06/03/19   Biagio Borg, MD    Allergies    Ivp dye [iodinated diagnostic agents], Promethazine-codeine, and Tramadol  Review of Systems   Review of Systems  Constitutional: Negative for chills and fever.  HENT: Negative for facial swelling and voice change.   Eyes: Negative for redness and visual disturbance.  Respiratory: Negative for cough and shortness of breath.   Cardiovascular: Positive for chest pain. Negative for palpitations.  Gastrointestinal: Negative for abdominal pain and vomiting.  Genitourinary: Negative for difficulty urinating and dysuria.  Musculoskeletal: Negative for gait problem and joint swelling.  Skin: Negative for rash and wound.  Neurological: Positive for tremors. Negative for dizziness and headaches.  Psychiatric/Behavioral: Negative for confusion and suicidal ideas.    Physical Exam Updated Vital Signs BP 120/79   Pulse 66   Temp 97.9 F (36.6 C) (Oral)   Resp (!) 22   LMP  (LMP Unknown)   SpO2 100%   Physical Exam Constitutional:      General: She is not in acute distress. HENT:     Head: Normocephalic and atraumatic.     Mouth/Throat:     Mouth: Mucous membranes are moist.     Pharynx: Oropharynx is  clear.  Eyes:     General: No scleral icterus.    Pupils: Pupils are equal, round, and reactive to light.  Cardiovascular:     Rate and Rhythm: Normal rate and regular  rhythm.     Pulses: Normal pulses.  Pulmonary:     Effort: Pulmonary effort is normal. No respiratory distress.  Abdominal:     General: There is no distension.     Tenderness: There is no abdominal tenderness.  Musculoskeletal:        General: No tenderness or deformity.     Cervical back: Normal range of motion and neck supple.     Comments: Chest pain reproduced on palpation of the left lateral chest.  Neurological:     General: No focal deficit present.     Mental Status: She is alert and oriented to person, place, and time. Mental status is at baseline.     Sensory: No sensory deficit.     Motor: Weakness present.     Coordination: Coordination normal.     Gait: Gait normal.     Deep Tendon Reflexes: Reflexes normal.     Comments: Trace right hemibody weakness, baseline per patient.  No appreciable tremor  Psychiatric:        Mood and Affect: Mood normal.        Behavior: Behavior normal.     ED Results / Procedures / Treatments   Labs (all labs ordered are listed, but only abnormal results are displayed) Labs Reviewed  BASIC METABOLIC PANEL - Abnormal; Notable for the following components:      Result Value   Creatinine, Ser 1.04 (*)    GFR calc non Af Amer 56 (*)    All other components within normal limits  CBC - Abnormal; Notable for the following components:   Hemoglobin 11.4 (*)    HCT 35.4 (*)    All other components within normal limits  URINALYSIS, ROUTINE W REFLEX MICROSCOPIC - Abnormal; Notable for the following components:   Color, Urine STRAW (*)    Specific Gravity, Urine 1.003 (*)    All other components within normal limits  CBG MONITORING, ED  TROPONIN I (HIGH SENSITIVITY)    EKG EKG Interpretation  Date/Time:  Monday August 09 2020 16:19:45 EDT Ventricular Rate:  76 PR  Interval:  178 QRS Duration: 82 QT Interval:  388 QTC Calculation: 436 R Axis:   6 Text Interpretation: Normal sinus rhythm Normal ECG some artifact but overall similar Confirmed by Theotis Burrow [phone]) on 08/09/2020 9:55:48 PM   Radiology DG Ribs Unilateral W/Chest Left  Result Date: 08/09/2020 CLINICAL DATA:  Left chest pain EXAM: LEFT RIBS AND CHEST - 3+ VIEW COMPARISON:  None. FINDINGS: No fracture or other bone lesions are seen involving the ribs. There is no evidence of pneumothorax or pleural effusion. Minimal left basilar scarring. Both lungs are otherwise clear. Heart size and mediastinal contours are within normal limits. IMPRESSION: Negative. Electronically Signed   By: Fidela Salisbury MD   On: 08/09/2020 23:05    Procedures Procedures (including critical care time)  Medications Ordered in ED Medications - No data to display  ED Course  I have reviewed the triage vital signs and the nursing notes.  Pertinent labs & imaging results that were available during my care of the patient were reviewed by me and considered in my medical decision making (see chart for details).  Clinical Course as of Aug 10 18  [facility] Aug 09, 2020  2327 No evidence of fracture.  DG Ribs Unilateral W/Chest Left [JR]    Clinical Course User Index [JR] Renold Genta, MD   MDM Rules/Calculators/A&P  EKG findings by my read: Compared to prior: 12/29/2019.  Rate: 76 rhythm: sinus Axis: appropriate  PR: 178 QRS: 82 QTc: 436.  No evidence of ischemia or arrhythmia, nor any other pathologic findings concerning considering patient presentation. Findings discussed with attending who agrees.  Differential diagnosis considered: Musculoskeletal chest pain, muscle strain, ACS, PE, rib fracture, pneumonia, pneumothorax, abdominal pathology, CVA, electrolyte disturbance  Patient presenting for evaluation of what certainly appears to be a musculoskeletal type chest pain.  This began with  elevation of the arm and pushing on a car door and it is reproducible on palpation of the left chest.  Extremely low suspicion for ACS however given the patient's numerous risk factors and the fact that this is radiated through the chest will obtain single troponin, follow-up EKG.  Labs obtained in triage unremarkable, neurological exam unremarkable, counseled the patient that although the etiology of her tremors unclear appears to have completely resolved given her reassuring work-up and reassuring neurological exam at this time, feel that no further work-up is indicated.  We will obtain rib x-ray to evaluate for possible rib fracture although lower suspicion for this as well.  Troponin: Within normal limits at 4  At this time I feel patient stable for discharge home to follow-up with PCP outpatient if symptoms persist, will prescribe lidocaine patches for musculoskeletal chest pain.  Strict return precautions stressed to the patient expressed understanding and agreement.   Final Clinical Impression(s) / ED Diagnoses Final diagnoses:  Musculoskeletal chest pain    Rx / DC Orders ED Discharge Orders         Ordered    lidocaine (LIDODERM) 5 %  Every 24 hours        08/10/20 0019         Labs, studies and imaging reviewed by myself and considered in medical decision making if ordered. Imaging interpreted by radiology. Pt was discussed with my attending, Dr. Rex Kras  Electronically signed by:  Roderic Palau [place]9/7/202112:19 AM       Renold Genta, MD 08/10/20 0019    Rex Kras Wenda [place], MD 08/10/20 [phone]

## 2020-08-10 LAB — TROPONIN I (HIGH SENSITIVITY): Troponin I (High Sensitivity): 4 ng/L (ref <18)

## 2020-08-10 MED ORDER — LIDOCAINE 5 % EX PTCH: 1.0000 | MEDICATED_PATCH | CUTANEOUS | 0 refills | Status: DC

## 2020-08-25 ENCOUNTER — Other Ambulatory Visit: Payer: Self-pay | Admitting: Internal Medicine

## 2020-08-25 DIAGNOSIS — J45991 Cough variant asthma: Secondary | ICD-10-CM

## 2020-09-07 ENCOUNTER — Other Ambulatory Visit: Payer: Self-pay | Admitting: Adult Health

## 2020-09-07 ENCOUNTER — Telehealth: Payer: Self-pay | Admitting: Internal Medicine

## 2020-09-07 MED ORDER — CLOTRIMAZOLE 10 MG MT TROC: 10.0000 mg | Freq: Every day | OROMUCOSAL | 5 refills | Status: DC | PRN

## 2020-09-07 NOTE — Telephone Encounter (Signed)
Called and spoke with patient she states that over the weekend she developed thrush and is asking for RX of Clotrimazole troche  Dr. Melvyn Novas please advise

## 2020-09-07 NOTE — Telephone Encounter (Signed)
Refill prev rx or if need new one  Clotrimazole troche 10 mg up to 5 x daily prn  #15  Refill x 5

## 2020-09-07 NOTE — Telephone Encounter (Signed)
Spoke with pt, aware of recs.  rx sent to preferred pharmacy.  Nothing further needed at this time- will close encounter.   

## 2020-09-07 NOTE — Telephone Encounter (Signed)
ATC patient LMTCB X1 

## 2020-09-09 ENCOUNTER — Encounter: Payer: Self-pay | Admitting: Internal Medicine

## 2020-09-09 ENCOUNTER — Ambulatory Visit: Payer: PPO | Admitting: Internal Medicine

## 2020-09-09 ENCOUNTER — Other Ambulatory Visit: Payer: Self-pay

## 2020-09-09 ENCOUNTER — Ambulatory Visit (INDEPENDENT_AMBULATORY_CARE_PROVIDER_SITE_OTHER): Payer: PPO | Admitting: Internal Medicine

## 2020-09-09 ENCOUNTER — Other Ambulatory Visit: Payer: Self-pay | Admitting: Internal Medicine

## 2020-09-09 ENCOUNTER — Ambulatory Visit (INDEPENDENT_AMBULATORY_CARE_PROVIDER_SITE_OTHER): Payer: PPO

## 2020-09-09 DIAGNOSIS — M7989 Other specified soft tissue disorders: Secondary | ICD-10-CM | POA: Diagnosis not present

## 2020-09-09 DIAGNOSIS — I6310 Cerebral infarction due to embolism of unspecified precerebral artery: Secondary | ICD-10-CM | POA: Diagnosis not present

## 2020-09-09 DIAGNOSIS — M25532 Pain in left wrist: Secondary | ICD-10-CM

## 2020-09-09 DIAGNOSIS — E559 Vitamin D deficiency, unspecified: Secondary | ICD-10-CM

## 2020-09-09 LAB — URIC ACID: Uric Acid, Serum: 5.8 mg/dL (ref 2.4–7.0)

## 2020-09-09 MED ORDER — METHYLPREDNISOLONE 4 MG PO TBPK: ORAL_TABLET | ORAL | 0 refills | Status: DC

## 2020-09-09 NOTE — Addendum Note (Signed)
Addended by: Cresenciano Lick on: 09/09/2020 09:38 AM   Modules accepted: Orders

## 2020-09-09 NOTE — Progress Notes (Signed)
Subjective:  Patient ID: Andrea Mason, female    DOB: 16-Feb-1951  Age: 69 y.o. MRN: [id number]  CC: Wrist Pain   HPI Andrea Mason presents for L wrist swelling, pain x 3-4 d since the weekend - went to [facility] No injury F/u CVA, Vit D def  Outpatient Medications Prior to Visit  Medication Sig Dispense Refill  . acetaminophen (TYLENOL) 325 MG tablet Take as directed per bottle instructions    . albuterol (VENTOLIN HFA) 108 (90 Base) MCG/ACT inhaler Inhale 1-2 puffs into the lungs every 6 (six) hours as needed for wheezing or shortness of breath.     Marland Kitchen amoxicillin-clavulanate (AUGMENTIN) 875-125 MG tablet Take 1 tablet by mouth 2 (two) times daily. 20 tablet 0  . aspirin 325 MG tablet Take 325 mg by mouth daily.    . baclofen (LIORESAL) 10 MG tablet Take 5 mg by mouth every 6 (six) hours as needed for muscle spasms.    . Biotin 10000 MCG TABS Take 10,000 mcg by mouth daily.    . budesonide-formoterol (SYMBICORT) 80-4.5 MCG/ACT inhaler Inhale 2 puffs into the lungs 2 (two) times daily. 3 Inhaler 3  . cetirizine (ZYRTEC ALLERGY) 10 MG tablet Take 10 mg by mouth at bedtime.     . Cholecalciferol (VITAMIN D3) 2000 units TABS Take 2,000 Units by mouth daily.    . clotrimazole (MYCELEX) 10 MG troche Take 1 tablet (10 mg total) by mouth 5 (five) times daily as needed. 15 Troche 5  . darifenacin (ENABLEX) 7.5 MG 24 hr tablet Take 1 tablet (7.5 mg total) by mouth daily. 90 tablet 3  . dextromethorphan-guaiFENesin (MUCINEX DM) 30-600 MG 12hr tablet Take 1-2 tablets by mouth 2 (two) times daily as needed (for congestion/cough (WITH FLUTTER VALVE)).     . famotidine (PEPCID) 20 MG tablet Take 1 tablet (20 mg total) by mouth at bedtime. 90 tablet 3  . lidocaine (LIDODERM) 5 % Place 1 patch onto the skin daily. Apply to painful area of chest, remove & discard patch every 12hrs allowing 12hr without patch present 30 patch 0  . Magnesium 250 MG TABS Take 1 tablet by mouth daily.    . mometasone (NASONEX)  50 MCG/ACT nasal spray Place 2 sprays into the nose 2 (two) times daily.    . montelukast (SINGULAIR) 10 MG tablet TAKE 1 TABLET(10 MG) BY MOUTH AT BEDTIME 30 tablet 11  . oxymetazoline (AFRIN) 0.05 % nasal spray Place 2 sprays into both nostrils 2 (two) times daily as needed (nasal stuffiness and nasal pressure for 5 days).     . pantoprazole (PROTONIX) 40 MG tablet TAKE 1 TABLET(40 MG) BY MOUTH DAILY 90 tablet 1  . Pitavastatin Calcium (LIVALO) 4 MG TABS Take 1 tablet by mouth daily.    . Polyethylene Glycol 3350 (MIRALAX PO) 1 capful daily as needed    . Respiratory Therapy Supplies (FLUTTER) DEVI Use as directed    . sodium chloride (OCEAN) 0.65 % SOLN nasal spray Place 2 sprays into both nostrils as needed (dry nose).     Marland Kitchen spironolactone (ALDACTONE) 25 MG tablet Take 25 mg by mouth daily.     Marland Kitchen triamcinolone (NASACORT) 55 MCG/ACT AERO nasal inhaler Place 2 sprays into the nose daily. 1 Inhaler 12   No facility-administered medications prior to visit.    ROS: Review of Systems  Constitutional: Negative for activity change, appetite change, chills, fatigue and unexpected weight change.  HENT: Negative for congestion, mouth sores and sinus pressure.  Eyes: Negative for visual disturbance.  Respiratory: Negative for cough and chest tightness.   Gastrointestinal: Negative for abdominal pain and nausea.  Genitourinary: Negative for difficulty urinating, frequency and vaginal pain.  Musculoskeletal: Positive for arthralgias. Negative for back pain and gait problem.  Skin: Negative for pallor and rash.  Neurological: Negative for dizziness, tremors, weakness, numbness and headaches.  Psychiatric/Behavioral: Negative for confusion and sleep disturbance.    Objective:  BP 120/72 (BP Location: Left Arm, Patient Position: Sitting, Cuff Size: Normal)   Pulse 83   Temp 97.9 F (36.6 C) (Oral)   Ht 5\' 2"  (1.575 m)   Wt 189 lb (85.7 kg)   LMP  (LMP Unknown)   SpO2 99%   BMI 34.57 kg/m    BP Readings from Last 3 Encounters:  09/09/20 120/72  08/10/20 123/74  02/17/20 (!) 102/57    Wt Readings from Last 3 Encounters:  09/09/20 189 lb (85.7 kg)  04/09/20 201 lb (91.2 kg)  02/17/20 197 lb (89.4 kg)    Physical Exam Constitutional:      General: She is not in acute distress.    Appearance: She is well-developed.  HENT:     Head: Normocephalic.     Right Ear: External ear normal.     Left Ear: External ear normal.     Nose: Nose normal.  Eyes:     General:        Right eye: No discharge.        Left eye: No discharge.     Conjunctiva/sclera: Conjunctivae normal.     Pupils: Pupils are equal, round, and reactive to light.  Neck:     Thyroid: No thyromegaly.     Vascular: No JVD.     Trachea: No tracheal deviation.  Cardiovascular:     Rate and Rhythm: Normal rate and regular rhythm.     Heart sounds: Normal heart sounds.  Pulmonary:     Effort: No respiratory distress.     Breath sounds: No stridor. No wheezing.  Abdominal:     General: Bowel sounds are normal. There is no distension.     Palpations: Abdomen is soft. There is no mass.     Tenderness: There is no abdominal tenderness. There is no guarding or rebound.  Musculoskeletal:        General: No tenderness.     Cervical back: Normal range of motion and neck supple.  Lymphadenopathy:     Cervical: No cervical adenopathy.  Skin:    Findings: No erythema or rash.  Neurological:     Cranial Nerves: No cranial nerve deficit.     Motor: No abnormal muscle tone.     Coordination: Coordination normal.     Deep Tendon Reflexes: Reflexes normal.  Psychiatric:        Behavior: Behavior normal.        Thought Content: Thought content normal.        Judgment: Judgment normal.    L wrist is swollen, tender  Lab Results  Component Value Date   WBC 5.4 08/09/2020   HGB 11.4 (L) 08/09/2020   HCT 35.4 (L) 08/09/2020   PLT 194 08/09/2020   GLUCOSE 89 08/09/2020   CHOL 79 06/03/2019   TRIG 72.0  06/03/2019   HDL 55.60 06/03/2019   LDLCALC 9 06/03/2019   ALT 18 12/26/2019   AST 18 12/26/2019   NA 137 08/09/2020   K 4.1 08/09/2020   CL 103 08/09/2020   CREATININE 1.04 (H) 08/09/2020  BUN 13 08/09/2020   CO2 26 08/09/2020   TSH 1.05 06/03/2019   INR 1.0 12/26/2019   HGBA1C 5.9 06/03/2019    DG Ribs Unilateral W/Chest Left  Result Date: 08/09/2020 CLINICAL DATA:  Left chest pain EXAM: LEFT RIBS AND CHEST - 3+ VIEW COMPARISON:  None. FINDINGS: No fracture or other bone lesions are seen involving the ribs. There is no evidence of pneumothorax or pleural effusion. Minimal left basilar scarring. Both lungs are otherwise clear. Heart size and mediastinal contours are within normal limits. IMPRESSION: Negative. Electronically Signed   By: Fidela Salisbury MD   On: 08/09/2020 23:05    Assessment & Plan:   There are no diagnoses linked to this encounter.   No orders of the defined types were placed in this encounter.    Follow-up: No follow-ups on file.  Walker Kehr, MD

## 2020-09-09 NOTE — Assessment & Plan Note (Signed)
Vit D 

## 2020-09-09 NOTE — Assessment & Plan Note (Signed)
No relapse On ASA 

## 2020-09-09 NOTE — Assessment & Plan Note (Addendum)
R/o gout Labs Medrol pack ACE

## 2020-09-10 LAB — COMPLETE METABOLIC PANEL WITH GFR
AG Ratio: 1.3 (calc) (ref 1.0–2.5)
ALT: 11 U/L (ref 6–29)
AST: 17 U/L (ref 10–35)
Albumin: 4 g/dL (ref 3.6–5.1)
Alkaline phosphatase (APISO): 105 U/L (ref 37–153)
BUN/Creatinine Ratio: 14 (calc) (ref 6–22)
BUN: 15 mg/dL (ref 7–25)
CO2: 24 mmol/L (ref 20–32)
Calcium: 9.6 mg/dL (ref 8.6–10.4)
Chloride: 104 mmol/L (ref 98–110)
Creat: 1.09 mg/dL — ABNORMAL HIGH (ref 0.50–0.99)
GFR, Est African American: 61 mL/min/{1.73_m2} (ref >=60)
GFR, Est Non African American: 52 mL/min/{1.73_m2} — ABNORMAL LOW (ref >=60)
Globulin: 3.2 g/dL (calc) (ref 1.9–3.7)
Glucose, Bld: 109 mg/dL — ABNORMAL HIGH (ref 65–99)
Potassium: 4.6 mmol/L (ref 3.5–5.3)
Sodium: 141 mmol/L (ref 135–146)
Total Bilirubin: 0.8 mg/dL (ref 0.2–1.2)
Total Protein: 7.2 g/dL (ref 6.1–8.1)

## 2020-09-10 LAB — RHEUMATOID FACTOR: Rheumatoid fact SerPl-aCnc: <14 IU/mL (ref <14)

## 2020-09-22 ENCOUNTER — Other Ambulatory Visit: Payer: Self-pay

## 2020-09-22 ENCOUNTER — Ambulatory Visit (INDEPENDENT_AMBULATORY_CARE_PROVIDER_SITE_OTHER): Payer: PPO | Admitting: Internal Medicine

## 2020-09-22 ENCOUNTER — Encounter: Payer: Self-pay | Admitting: Internal Medicine

## 2020-09-22 VITALS — BP 130/82 | HR 72 | Temp 98.7°F | Ht 62.0 in | Wt 190.0 lb

## 2020-09-22 DIAGNOSIS — I10 Essential (primary) hypertension: Secondary | ICD-10-CM

## 2020-09-22 DIAGNOSIS — R350 Frequency of micturition: Secondary | ICD-10-CM | POA: Insufficient documentation

## 2020-09-22 DIAGNOSIS — N183 Chronic kidney disease, stage 3 unspecified: Secondary | ICD-10-CM | POA: Diagnosis not present

## 2020-09-22 DIAGNOSIS — Z0000 Encounter for general adult medical examination without abnormal findings: Secondary | ICD-10-CM

## 2020-09-22 DIAGNOSIS — M25532 Pain in left wrist: Secondary | ICD-10-CM | POA: Diagnosis not present

## 2020-09-22 DIAGNOSIS — E538 Deficiency of other specified B group vitamins: Secondary | ICD-10-CM | POA: Diagnosis not present

## 2020-09-22 DIAGNOSIS — R739 Hyperglycemia, unspecified: Secondary | ICD-10-CM | POA: Diagnosis not present

## 2020-09-22 LAB — URINALYSIS, ROUTINE W REFLEX MICROSCOPIC
Bilirubin Urine: NEGATIVE
Hgb urine dipstick: NEGATIVE
Ketones, ur: NEGATIVE
Leukocytes,Ua: NEGATIVE
Nitrite: NEGATIVE
RBC / HPF: NONE SEEN (ref 0–?)
Specific Gravity, Urine: 1.025 (ref 1.000–1.030)
Total Protein, Urine: NEGATIVE
Urine Glucose: NEGATIVE
Urobilinogen, UA: 1 (ref 0.0–1.0)
WBC, UA: NONE SEEN (ref 0–?)
pH: 6 (ref 5.0–8.0)

## 2020-09-22 LAB — HEMOGLOBIN A1C: Hgb A1c MFr Bld: 6.3 % (ref 4.6–6.5)

## 2020-09-22 NOTE — Progress Notes (Signed)
Subjective:    Patient ID: Andrea Mason, female    DOB: 01-14-1951, 69 y.o.   MRN: [id number]  HPI  Here for wellness and f/u;  Overall doing ok;  Pt denies Chest pain, worsening SOB, DOE, wheezing, orthopnea, PND, worsening LE edema, palpitations, dizziness or syncope.  Pt denies neurological change such as new headache, facial or extremity weakness.  Pt denies polydipsia, polyuria, or low sugar symptoms. Pt states overall good compliance with treatment and medications, good tolerability, and has been trying to follow appropriate diet.  Pt denies worsening depressive symptoms, suicidal ideation or panic. No fever, night sweats, wt loss, loss of appetite, or other constitutional symptoms.  Pt states good ability with ADL's, has low fall risk, home safety reviewed and adequate, no other significant changes in hearing or vision, and only occasionally active with exercise. Did have urinary freq for about 1.5 days recently, none since then, wondering about UTI.  Left wrist pain and swelling resolved with steroid pakc, only slightly sore to touch since then.  Also has a small knot to anterior PIP joint first finger left hand, convinced its a splinter but no overlying skin change over a small slightly tender subq knot about 5 mm.  Past Medical History:  Diagnosis Date  . Allergic rhinitis   . Allergy   . Asthma   . Depression   . DJD (degenerative joint disease), cervical   . GERD (gastroesophageal reflux disease)   . History of colonic polyps   . Hypertension   . Obesity   . 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 Stroke ([place])   . Syncope    Past Surgical History:  Procedure Laterality Date  . COLONOSCOPY    . FOOT SURGERY  1998  . 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 LOOP RECORDER REMOVAL N/A 10/02/2017   Procedure: LOOP RECORDER REMOVAL;  Surgeon: [name], MD;  Location: [facility];   Service: Cardiovascular;  Laterality: N/A;  . NASAL POLYP SURGERY  07/2006   x 2 with Dr. [name]  . NASAL SINUS SURGERY  07/2006   Dr. [name]  . TUBAL LIGATION      reports that she has never smoked. She has never used smokeless tobacco. She reports current alcohol use. She reports that she does not use drugs. family history includes Arrhythmia in her brother; Cancer in her mother; Colon cancer in her brother; Congenital heart disease in her brother; Diabetes in her sister; Heart attack in her brother; Lung disease in her son; Stroke in her brother. Allergies  Allergen Reactions  . Ivp Dye [Iodinated Diagnostic Agents] Nausea And Vomiting and Other (See Comments)    hot flashes  . Promethazine-Codeine Nausea And Vomiting  . Tramadol Other (See Comments)    Almost passed out   Current Outpatient Medications on File Prior to Visit  Medication Sig Dispense Refill  . acetaminophen (TYLENOL) 325 MG tablet Take as directed per bottle instructions    . albuterol (VENTOLIN HFA) 108 (90 Base) MCG/ACT inhaler Inhale 1-2 puffs into the lungs every 6 (six) hours as needed for wheezing or shortness of breath.     Marland Kitchen aspirin 325 MG tablet Take 325 mg by mouth daily.    Marland Kitchen atorvastatin (LIPITOR) 10 MG tablet Take 10 mg by mouth daily.    . baclofen (LIORESAL) 10 MG tablet Take 5 mg by mouth every 6 (six) hours as needed  for muscle spasms.    . Biotin 10000 MCG TABS Take 10,000 mcg by mouth daily.    . budesonide-formoterol (SYMBICORT) 80-4.5 MCG/ACT inhaler Inhale 2 puffs into the lungs 2 (two) times daily. 3 Inhaler 3  . cetirizine (ZYRTEC ALLERGY) 10 MG tablet Take 10 mg by mouth at bedtime.     . Cholecalciferol (VITAMIN D3) 2000 units TABS Take 2,000 Units by mouth daily.    . clotrimazole (MYCELEX) 10 MG troche Take 1 tablet (10 mg total) by mouth 5 (five) times daily as needed. 15 Troche 5  . darifenacin (ENABLEX) 7.5 MG 24 hr tablet Take 1 tablet (7.5 mg total) by mouth daily. 90 tablet 3  .  dextromethorphan-guaiFENesin (MUCINEX DM) 30-600 MG 12hr tablet Take 1-2 tablets by mouth 2 (two) times daily as needed (for congestion/cough (WITH FLUTTER VALVE)).     . famotidine (PEPCID) 20 MG tablet Take 1 tablet (20 mg total) by mouth at bedtime. 90 tablet 3  . lidocaine (LIDODERM) 5 % Place 1 patch onto the skin daily. Apply to painful area of chest, remove & discard patch every 12hrs allowing 12hr without patch present 30 patch 0  . Magnesium 250 MG TABS Take 1 tablet by mouth daily.    . methylPREDNISolone (MEDROL DOSEPAK) 4 MG TBPK tablet As directed 21 tablet 0  . mometasone (NASONEX) 50 MCG/ACT nasal spray Place 2 sprays into the nose 2 (two) times daily.    . montelukast (SINGULAIR) 10 MG tablet TAKE 1 TABLET(10 MG) BY MOUTH AT BEDTIME 30 tablet 11  . oxymetazoline (AFRIN) 0.05 % nasal spray Place 2 sprays into both nostrils 2 (two) times daily as needed (nasal stuffiness and nasal pressure for 5 days).     . pantoprazole (PROTONIX) 40 MG tablet TAKE 1 TABLET(40 MG) BY MOUTH DAILY 90 tablet 1  . Polyethylene Glycol 3350 (MIRALAX PO) 1 capful daily as needed    . Respiratory Therapy Supplies (FLUTTER) DEVI Use as directed    . sodium chloride (OCEAN) 0.65 % SOLN nasal spray Place 2 sprays into both nostrils as needed (dry nose).     Marland Kitchen spironolactone (ALDACTONE) 25 MG tablet Take 25 mg by mouth daily.     Marland Kitchen triamcinolone (NASACORT) 55 MCG/ACT AERO nasal inhaler Place 2 sprays into the nose daily. 1 Inhaler 12   No current facility-administered medications on file prior to visit.   Review of Systems All otherwise neg per pt    Objective:   Physical Exam BP 130/82 (BP Location: Left Arm, Patient Position: Sitting, Cuff Size: Large)   Pulse 72   Temp 98.7 F (37.1 C) (Oral)   Ht 5\' 2"  (1.575 m)   Wt 190 lb (86.2 kg)   LMP  (LMP Unknown)   SpO2 93%   BMI 34.75 kg/m  VS noted,  Constitutional: Pt appears in NAD HENT: Head: NCAT.  Right Ear: External ear normal.  Left Ear:  External ear normal.  Eyes: . Pupils are equal, round, and reactive to light. Conjunctivae and EOM are normal Nose: without d/c or deformity Neck: Neck supple. Gross normal ROM Cardiovascular: Normal rate and regular rhythm.   Pulmonary/Chest: Effort normal and breath sounds without rales or wheezing.  Abd:  Soft, NT, ND, + BS, no organomegaly Neurological: Pt is alert. At baseline orientation, motor grossly intact Skin: Skin is warm. No rashes, other new lesions, no LE edema Psychiatric: Pt behavior is normal without agitation  All otherwise neg per pt Lab Results  Component Value  Date   WBC 5.4 08/09/2020   HGB 11.4 (L) 08/09/2020   HCT 35.4 (L) 08/09/2020   PLT 194 08/09/2020   GLUCOSE 109 (H) 09/09/2020   CHOL 79 06/03/2019   TRIG 72.0 06/03/2019   HDL 55.60 06/03/2019   LDLCALC 9 06/03/2019   ALT 11 09/09/2020   AST 17 09/09/2020   NA 141 09/09/2020   K 4.6 09/09/2020   CL 104 09/09/2020   CREATININE 1.09 (H) 09/09/2020   BUN 15 09/09/2020   CO2 24 09/09/2020   TSH 1.05 06/03/2019   INR 1.0 12/26/2019   HGBA1C 6.3 09/22/2020      Assessment & Plan:

## 2020-09-22 NOTE — Patient Instructions (Signed)

## 2020-09-23 ENCOUNTER — Encounter: Payer: Self-pay | Admitting: Internal Medicine

## 2020-09-23 LAB — URINE CULTURE

## 2020-09-25 ENCOUNTER — Encounter: Payer: Self-pay | Admitting: Internal Medicine

## 2020-09-29 ENCOUNTER — Encounter: Payer: Self-pay | Admitting: Internal Medicine

## 2020-09-29 NOTE — Assessment & Plan Note (Signed)
stable overall by history and exam, recent data reviewed with pt, and pt to continue medical treatment as before,  to f/u any worsening symptoms or concerns  

## 2020-09-29 NOTE — Assessment & Plan Note (Signed)

## 2020-09-29 NOTE — Assessment & Plan Note (Signed)
resolved 

## 2020-09-29 NOTE — Assessment & Plan Note (Signed)
Also for urinary studies

## 2020-10-01 ENCOUNTER — Ambulatory Visit: Payer: PPO | Admitting: Podiatry

## 2020-10-05 ENCOUNTER — Telehealth: Payer: Self-pay | Admitting: Internal Medicine

## 2020-10-05 NOTE — Telephone Encounter (Signed)
LDVM for pt of Dr. Gwynn Burly note along with asking the pt to call the clinic to make an appointment as soon as she can.

## 2020-10-05 NOTE — Telephone Encounter (Signed)
Sent to Dr.JOhn.

## 2020-10-05 NOTE — Telephone Encounter (Signed)
Patient calling very upset and doesn't understand why on her AVS it says she has Stage 3 chronic kidney disease but nothing is being done to treat it. Patient concerned because her husband died from stage 4 chronic kidney disease. [phone]

## 2020-10-05 NOTE — Telephone Encounter (Signed)
This kind of thing is not easily explained by email, but please reasurre pt that this is being addressed and we can speak about it at length a her next appt, or any appt sooner if she wants

## 2020-10-06 ENCOUNTER — Telehealth: Payer: Self-pay | Admitting: Adult Health

## 2020-10-06 ENCOUNTER — Ambulatory Visit (INDEPENDENT_AMBULATORY_CARE_PROVIDER_SITE_OTHER): Payer: PPO | Admitting: Internal Medicine

## 2020-10-06 ENCOUNTER — Other Ambulatory Visit: Payer: Self-pay

## 2020-10-06 ENCOUNTER — Encounter: Payer: Self-pay | Admitting: Internal Medicine

## 2020-10-06 DIAGNOSIS — I10 Essential (primary) hypertension: Secondary | ICD-10-CM | POA: Diagnosis not present

## 2020-10-06 DIAGNOSIS — N1831 Chronic kidney disease, stage 3a: Secondary | ICD-10-CM

## 2020-10-06 DIAGNOSIS — R739 Hyperglycemia, unspecified: Secondary | ICD-10-CM

## 2020-10-06 DIAGNOSIS — E7849 Other hyperlipidemia: Secondary | ICD-10-CM

## 2020-10-06 NOTE — Progress Notes (Addendum)
Subjective:    Patient ID: Andrea Mason, female    DOB: 25-Dec-1950, 69 y.o.   MRN: [id number]  HPI  Here to f/u; overall doing ok,  Pt denies chest pain, increasing sob or doe, wheezing, orthopnea, PND, increased LE swelling, palpitations, dizziness or syncope.  Pt denies new neurological symptoms such as new headache, or facial or extremity weakness or numbness.  Pt denies polydipsia, polyuria, or low sugar episode.  Pt states overall good compliance with meds, mostly trying to follow appropriate diet, with wt overall stable,  but little exercise however. Husband died with renal failure, so pt follows here renal fxn very closely. Past Medical History:  Diagnosis Date  . Allergic rhinitis   . Allergy   . Asthma   . Depression   . DJD (degenerative joint disease), cervical   . GERD (gastroesophageal reflux disease)   . History of colonic polyps   . Hypertension   . Obesity   . 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 Stroke ([place])   . Syncope    Past Surgical History:  Procedure Laterality Date  . COLONOSCOPY    . FOOT SURGERY  1998  . 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 LOOP RECORDER REMOVAL N/A 10/02/2017   Procedure: LOOP RECORDER REMOVAL;  Surgeon: [name], MD;  Location: [facility];  Service: Cardiovascular;  Laterality: N/A;  . NASAL POLYP SURGERY  07/2006   x 2 with Dr. [name]  . NASAL SINUS SURGERY  07/2006   Dr. [name]  . TUBAL LIGATION      reports that she has never smoked. She has never used smokeless tobacco. She reports current alcohol use. She reports that she does not use drugs. family history includes Arrhythmia in her brother; Cancer in her mother; Colon cancer in her brother; Congenital heart disease in her brother; Diabetes in her sister; Heart attack in her brother; Lung disease in her son; Stroke in her brother. Allergies  Allergen Reactions  . Ivp  Dye [Iodinated Diagnostic Agents] Nausea And Vomiting and Other (See Comments)    hot flashes  . Promethazine-Codeine Nausea And Vomiting  . Tramadol Other (See Comments)    Almost passed out   Current Outpatient Medications on File Prior to Visit  Medication Sig Dispense Refill  . acetaminophen (TYLENOL) 325 MG tablet Take as directed per bottle instructions    . albuterol (VENTOLIN HFA) 108 (90 Base) MCG/ACT inhaler Inhale 1-2 puffs into the lungs every 6 (six) hours as needed for wheezing or shortness of breath.     Marland Kitchen aspirin 325 MG tablet Take 325 mg by mouth daily.    Marland Kitchen atorvastatin (LIPITOR) 10 MG tablet Take 10 mg by mouth daily.    . baclofen (LIORESAL) 10 MG tablet Take 5 mg by mouth every 6 (six) hours as needed for muscle spasms.    . Biotin 10000 MCG TABS Take 10,000 mcg by mouth daily.    . budesonide-formoterol (SYMBICORT) 80-4.5 MCG/ACT inhaler Inhale 2 puffs into the lungs 2 (two) times daily. 3 Inhaler 3  . cetirizine (ZYRTEC ALLERGY) 10 MG tablet Take 10 mg by mouth at bedtime.     . Cholecalciferol (VITAMIN D3) 2000 units TABS Take 2,000 Units by mouth daily.    . clotrimazole (MYCELEX) 10 MG troche Take 1 tablet (10 mg total) by mouth 5 (five) times daily as needed. 15 Troche 5  . darifenacin (  ENABLEX) 7.5 MG 24 hr tablet Take 1 tablet (7.5 mg total) by mouth daily. 90 tablet 3  . dextromethorphan-guaiFENesin (MUCINEX DM) 30-600 MG 12hr tablet Take 1-2 tablets by mouth 2 (two) times daily as needed (for congestion/cough (WITH FLUTTER VALVE)).     . famotidine (PEPCID) 20 MG tablet Take 1 tablet (20 mg total) by mouth at bedtime. 90 tablet 3  . lidocaine (LIDODERM) 5 % Place 1 patch onto the skin daily. Apply to painful area of chest, remove & discard patch every 12hrs allowing 12hr without patch present 30 patch 0  . Magnesium 250 MG TABS Take 1 tablet by mouth daily.    . methylPREDNISolone (MEDROL DOSEPAK) 4 MG TBPK tablet As directed 21 tablet 0  . mometasone (NASONEX)  50 MCG/ACT nasal spray Place 2 sprays into the nose 2 (two) times daily.    . montelukast (SINGULAIR) 10 MG tablet TAKE 1 TABLET(10 MG) BY MOUTH AT BEDTIME 30 tablet 11  . oxymetazoline (AFRIN) 0.05 % nasal spray Place 2 sprays into both nostrils 2 (two) times daily as needed (nasal stuffiness and nasal pressure for 5 days).     . pantoprazole (PROTONIX) 40 MG tablet TAKE 1 TABLET(40 MG) BY MOUTH DAILY 90 tablet 1  . Polyethylene Glycol 3350 (MIRALAX PO) 1 capful daily as needed    . Respiratory Therapy Supplies (FLUTTER) DEVI Use as directed    . sodium chloride (OCEAN) 0.65 % SOLN nasal spray Place 2 sprays into both nostrils as needed (dry nose).     Marland Kitchen spironolactone (ALDACTONE) 25 MG tablet Take 25 mg by mouth daily.     Marland Kitchen triamcinolone (NASACORT) 55 MCG/ACT AERO nasal inhaler Place 2 sprays into the nose daily. 1 Inhaler 12   No current facility-administered medications on file prior to visit.   Review of Systems All otherwise neg per pt    Objective:   Physical Exam BP 120/80 (BP Location: Left Arm, Patient Position: Sitting, Cuff Size: Large)   Pulse 78   Temp 98.7 F (37.1 C) (Oral)   Ht 5\' 2"  (1.575 m)   Wt 191 lb (86.6 kg)   LMP  (LMP Unknown)   SpO2 99%   BMI 34.93 kg/m  VS noted,  Constitutional: Pt appears in NAD HENT: Head: NCAT.  Right Ear: External ear normal.  Left Ear: External ear normal.  Eyes: . Pupils are equal, round, and reactive to light. Conjunctivae and EOM are normal Nose: without d/c or deformity Neck: Neck supple. Gross normal ROM Cardiovascular: Normal rate and regular rhythm.   Pulmonary/Chest: Effort normal and breath sounds without rales or wheezing.  Abd:  Soft, NT, ND, + BS, no organomegaly Neurological: Pt is alert. At baseline orientation, motor grossly intact Skin: Skin is warm. No rashes, other new lesions, no LE edema Psychiatric: Pt behavior is normal without agitation  All otherwise neg per pt Lab Results  Component Value Date    WBC 5.4 08/09/2020   HGB 11.4 (L) 08/09/2020   HCT 35.4 (L) 08/09/2020   PLT 194 08/09/2020   GLUCOSE 109 (H) 09/09/2020   CHOL 79 06/03/2019   TRIG 72.0 06/03/2019   HDL 55.60 06/03/2019   LDLCALC 9 06/03/2019   ALT 11 09/09/2020   AST 17 09/09/2020   NA 141 09/09/2020   K 4.6 09/09/2020   CL 104 09/09/2020   CREATININE 1.09 (H) 09/09/2020   BUN 15 09/09/2020   CO2 24 09/09/2020   TSH 1.05 06/03/2019   INR 1.0 12/26/2019  HGBA1C 6.3 09/22/2020      Assessment & Plan:

## 2020-10-07 NOTE — Telephone Encounter (Signed)
Med cal printed and mailed to the pt per her request  Nothing further needed

## 2020-10-10 ENCOUNTER — Encounter: Payer: Self-pay | Admitting: Internal Medicine

## 2020-10-10 NOTE — Assessment & Plan Note (Signed)
stable overall by history and exam, recent data reviewed with pt, and pt to continue medical treatment as before,  to f/u any worsening symptoms or concerns  

## 2020-10-10 NOTE — Patient Instructions (Signed)
Please continue all other medications as before, and refills have been done if requested.  Please have the pharmacy call with any other refills you may need.  Please continue your efforts at being more active, low cholesterol diet, and weight control.  Please keep your appointments with your specialists as you may have planned     

## 2020-10-15 ENCOUNTER — Other Ambulatory Visit: Payer: Self-pay

## 2020-10-15 ENCOUNTER — Encounter: Payer: Self-pay | Admitting: Internal Medicine

## 2020-10-15 ENCOUNTER — Ambulatory Visit: Payer: PPO | Admitting: Internal Medicine

## 2020-10-15 DIAGNOSIS — J45991 Cough variant asthma: Secondary | ICD-10-CM | POA: Diagnosis not present

## 2020-10-15 MED ORDER — BUDESONIDE-FORMOTEROL FUMARATE 80-4.5 MCG/ACT IN AERO
2.0000 | INHALATION_SPRAY | Freq: Two times a day (BID) | RESPIRATORY_TRACT | 11 refills | Status: DC
Start: 2020-10-15 — End: 2021-06-10

## 2020-10-15 MED ORDER — AMOXICILLIN-POT CLAVULANATE 875-125 MG PO TABS: 1.0000 | ORAL_TABLET | Freq: Two times a day (BID) | ORAL | 11 refills | Status: AC

## 2020-10-15 NOTE — Progress Notes (Signed)
Subjective:   Patient ID: Andrea Mason, female    DOB: 06/02/1951   MRN: [id number]  Brief patient profile:  89  yobf never smoker followed by Dr Loretha Brasil for allergic rhinitis and asthma     History of Present Illness  02/05/2013 1st pulmonary eval in EPIC era baseline = completely better on qvar 80 2bid and only use rescue once a week at baseline then much worse starting 02/03/13 with cough/ congestion/ green mucus and rescue x one by time of ov at 2pm.  No resting sob, very hoarse with harsh barking cough >Augmentin rx      12/18/2017  f/u ov/Arvind Mexicano re:  Asthma/ chronic rhinitis / doing better following med calendar  Chief Complaint  Patient presents with  . Follow-up    slight cough, red colored mucus when blowing her nose, feels like z pac and prednisone helped after her last visit   very rare saba need  Not limited by breathing from desired activities   Sleeping fine  rec 3 month f/u [name] P re med calendar/ reconciliation > did not do    06/13/2018 acute extended ov/Chantz Montefusco re: cough x one week > dark mucus with nasal congestion/ acute onset  Chief Complaint  Patient presents with  . Acute Visit    coughing up black mucus,, sinus infection, nasal congestion   breathing is ok  Has calendar but not following contingency instructions re rx for cough with nasty sputum, has augmentin refillable thru 12/2018 rec If night time cough or reflux best medication = Pepcid 20 mg otc or get Dr Jenny Reichmann to write it.   I would take the vaccine when available.    10/15/2020  f/u ov/Aarin Sparkman re: asthma/ chronic rhinitis  using med calendar well/ maint on symb 43 2bid  Chief Complaint  Patient presents with  . Follow-up    pt states having clear mucus in mouth   Dyspnea: planning going to gym / walking neighborhood x one hour  With hills x 1 h but not lately Cough: spoardic / daytime/ assoc pnds s purulent sputum Sleeping: bed is flat/ sev pillows / no resp complaints  SABA use: none  02: none     No obvious day to day or daytime variability or assoc  purulent sputum or mucus plugs or hemoptysis or cp or chest tightness, subjective wheeze or overt sinus or hb symptoms.   Sleeping fine  without nocturnal  or early am exacerbation  of respiratory  c/o's or need for noct saba. Also denies any obvious fluctuation of symptoms with weather or environmental changes or other aggravating or alleviating factors except as outlined above   No unusual exposure hx or h/o childhood pna/ asthma or knowledge of premature birth.  Current Allergies, Complete Past Medical History, Past Surgical History, Family History, and Social History were reviewed in [employer] record.  ROS  The following are not active complaints unless bolded Hoarseness, sore throat, dysphagia, dental problems, itchy skin , sneezing,  nasal congestion or discharge of excess mucus or purulent secretions, ear ache,   fever, chills, sweats, unintended wt loss or wt gain, classically pleuritic or exertional cp,  orthopnea pnd or arm/hand swelling  or leg swelling, presyncope, palpitations, abdominal pain, anorexia, nausea, vomiting, diarrhea  or change in bowel habits or change in bladder habits, change in stools or change in urine, dysuria, hematuria,  rash, arthralgias, visual complaints, headache, numbness, weakness or ataxia or problems with walking or coordination,  change in mood  or  memory.        Current Meds  Medication Sig  . acetaminophen (TYLENOL) 325 MG tablet Take as directed per bottle instructions  . albuterol (VENTOLIN HFA) 108 (90 Base) MCG/ACT inhaler Inhale 1-2 puffs into the lungs every 6 (six) hours as needed for wheezing or shortness of breath.   Marland Kitchen aspirin 325 MG tablet Take 325 mg by mouth daily.  Marland Kitchen atorvastatin (LIPITOR) 10 MG tablet Take 10 mg by mouth daily.  . baclofen (LIORESAL) 10 MG tablet Take 5 mg by mouth every 6 (six) hours as needed for muscle spasms.  . Biotin 10000 MCG TABS  Take 10,000 mcg by mouth daily.  . budesonide-formoterol (SYMBICORT) 80-4.5 MCG/ACT inhaler Inhale 2 puffs into the lungs 2 (two) times daily.  . cetirizine (ZYRTEC ALLERGY) 10 MG tablet Take 10 mg by mouth at bedtime.   . Cholecalciferol (VITAMIN D3) 2000 units TABS Take 2,000 Units by mouth daily.  Marland Kitchen dextromethorphan-guaiFENesin (MUCINEX DM) 30-600 MG 12hr tablet Take 1-2 tablets by mouth 2 (two) times daily as needed (for congestion/cough (WITH FLUTTER VALVE)).   . famotidine (PEPCID) 20 MG tablet Take 1 tablet (20 mg total) by mouth at bedtime.  . lidocaine (LIDODERM) 5 % Place 1 patch onto the skin daily. Apply to painful area of chest, remove & discard patch every 12hrs allowing 12hr without patch present  . Magnesium 250 MG TABS Take 1 tablet by mouth daily.  . methylPREDNISolone (MEDROL DOSEPAK) 4 MG TBPK tablet As directed  . mometasone (NASONEX) 50 MCG/ACT nasal spray Place 2 sprays into the nose 2 (two) times daily.  . montelukast (SINGULAIR) 10 MG tablet TAKE 1 TABLET(10 MG) BY MOUTH AT BEDTIME  . oxymetazoline (AFRIN) 0.05 % nasal spray Place 2 sprays into both nostrils 2 (two) times daily as needed (nasal stuffiness and nasal pressure for 5 days).   . pantoprazole (PROTONIX) 40 MG tablet TAKE 1 TABLET(40 MG) BY MOUTH DAILY  . Polyethylene Glycol 3350 (MIRALAX PO) 1 capful daily as needed  . Respiratory Therapy Supplies (FLUTTER) DEVI Use as directed  . sodium chloride (OCEAN) 0.65 % SOLN nasal spray Place 2 sprays into both nostrils as needed (dry nose).   Marland Kitchen spironolactone (ALDACTONE) 25 MG tablet Take 25 mg by mouth daily.   Marland Kitchen triamcinolone (NASACORT) 55 MCG/ACT AERO nasal inhaler Place 2 sprays into the nose daily.  . [DISCONTINUED] clotrimazole (MYCELEX) 10 MG troche Take 1 tablet (10 mg total) by mouth 5 (five) times daily as needed.                           Objective:   amb obese bf nad    10/15/2020       191  06/13/2018         176  12/18/2017          170    07/11/16 159 lb 3.2 oz (72.2 kg)  07/11/16 160 lb (72.6 kg)  04/05/16 163 lb (73.9 kg)    Vital signs reviewed  10/15/2020  - Note at rest 02 sats  99% on RA    HEENT : pt wearing mask not removed for exam due to covid -19 concerns.    NECK :  without JVD/Nodes/TM/ nl carotid upstrokes bilaterally   LUNGS: no acc muscle use,  Nl contour chest which is clear to A and P bilaterally without cough on insp or exp maneuvers   CV:  RRR  no  s3 or murmur or increase in P2, and no edema   ABD:  soft and nontender with nl inspiratory excursion in the supine position. No bruits or organomegaly appreciated, bowel sounds nl  MS:  Nl gait/ ext warm without deformities, calf tenderness, cyanosis or clubbing No obvious joint restrictions   SKIN: warm and dry without lesions    NEURO:  alert, approp, nl sensorium with  no motor or cerebellar deficits apparent.             Assessment & Plan:

## 2020-10-15 NOTE — Patient Instructions (Addendum)
See calendar for specific medication instructions and bring it back for each and every office visit for every healthcare provider you see.  Without it,  you may not receive the best quality medical care that we feel you deserve.  You will note that the calendar groups together  your maintenance  medications that are timed at particular times of the day.  Think of this as your checklist for what your doctor has instructed you to do until your next evaluation to see what benefit  there is  to staying on a consistent group of medications intended to keep you well.  The other group at the bottom is entirely up to you to use as you see fit  for specific symptoms that may arise between visits that require you to treat them on an as needed basis.  Think of this as your action plan or "what if" list.   Separating the top medications from the bottom group is fundamental to providing you adequate care going forward.    I recommend pfizer vaccine now and wait 2 weeks for flu  Please schedule a follow up visit in 12 months but call sooner if needed

## 2020-10-16 ENCOUNTER — Encounter: Payer: Self-pay | Admitting: Internal Medicine

## 2020-10-16 NOTE — Assessment & Plan Note (Signed)
-  Spirometry 12/11/2014  Min abn mid flows only   -  10/15/2020  After extensive coaching inhaler device,  effectiveness =    90%   All goals of chronic asthma control met including optimal function and elimination of symptoms with minimal need for rescue therapy.  Contingencies discussed in full including contacting this office immediately if not controlling the symptoms using the rule of two's.     rec pfizer booster for covid as soon as she can schedule as she is now > 6 m since last shot   F/u can be yearly for refills/ sooner prn           Each maintenance medication was reviewed in detail including emphasizing most importantly the difference between maintenance and prns and under what circumstances the prns are to be triggered using an action plan format where appropriate.  Total time for H and P, chart review, counseling, teaching device and generating customized AVS unique to this office visit / charting = 15 min

## 2020-10-25 ENCOUNTER — Other Ambulatory Visit: Payer: Self-pay | Admitting: Internal Medicine

## 2020-10-25 NOTE — Telephone Encounter (Signed)
Please refill as per office routine med refill policy (all routine meds refilled for 3 mo or monthly per pt preference up to one year from last visit, then month to month grace period for 3 mo, then further med refills will have to be denied)  

## 2020-12-23 IMAGING — MR MR HEAD W/O CM
10 of 11 series · 42 of 48 positions shown · non-contrast
Comparison: Brain MRI 05/25/2014

Head CT 12/26/2019

CLINICAL DATA: Slurred speech

EXAM:
MRI HEAD WITHOUT CONTRAST
TECHNIQUE: Multiplanar, multiecho pulse sequences of the brain and surrounding
structures were obtained without intravenous contrast.

[Series 11: DWI · coronal · 4.0mm · 0.88mm/px · 7 of 68 slices shown (1 of 4)]
[im 1/68]
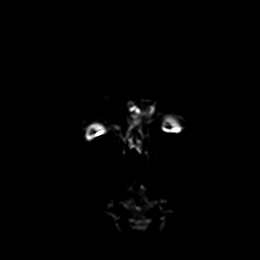
[im 12/68]
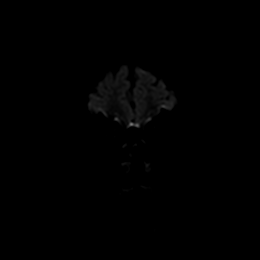
[im 23/68]
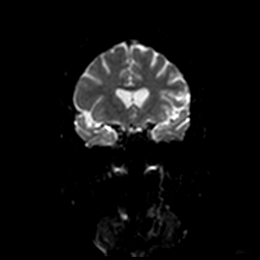
[im 34/68]
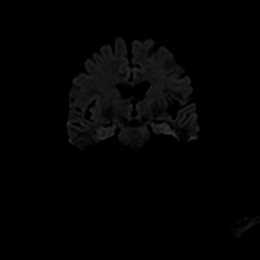
[im 45/68]
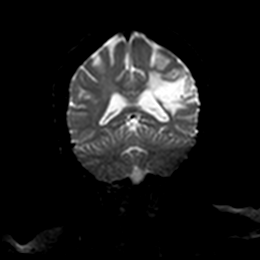
[im 56/68]
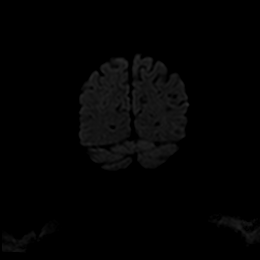
[im 68/68]
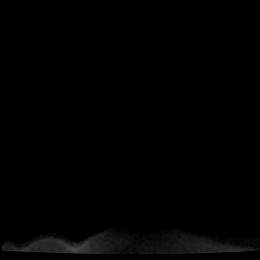

[Series 12: DWI · coronal · 4.0mm · 0.88mm/px · 3 of 34 slices shown (2 of 4)]
[im 1/34]
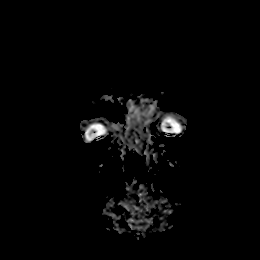
[im 17/34]
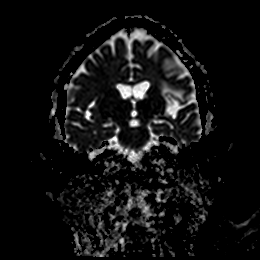
[im 34/34]
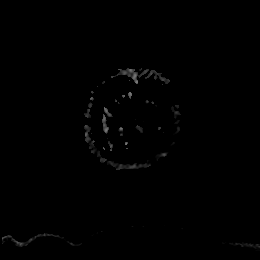

[Series 13: T1 · sagittal · 5.0mm · 0.75mm/px · 2 of 25 slices shown]
[im 1/25]
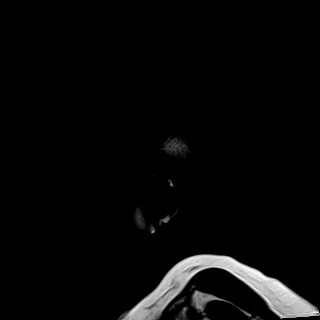
[im 25/25]
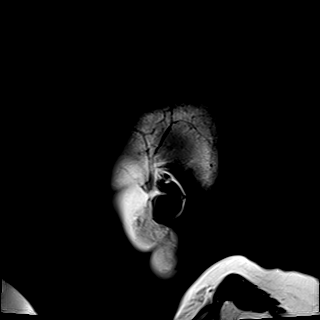

[Series 100: DWI · axial · 3.0mm · 0.88mm/px · z∈[-78,+55]mm · 8 of 92 slices shown (3 of 4)]
[im 1/92]
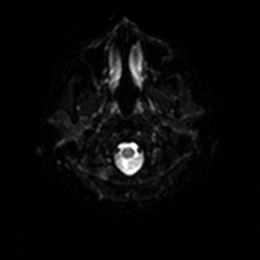
[im 14/92]
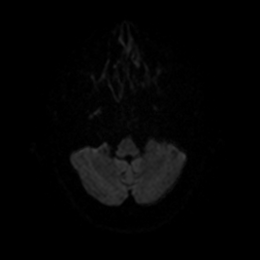
[im 27/92]
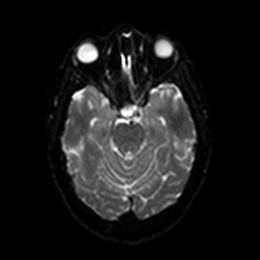
[im 40/92]
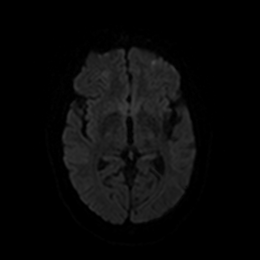
[im 53/92]
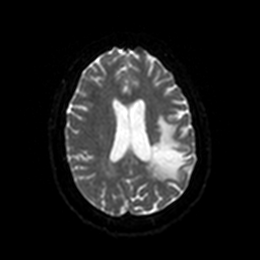
[im 66/92]
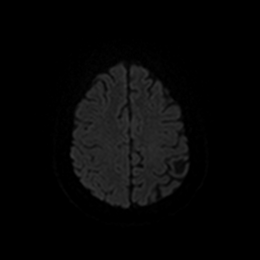
[im 79/92]
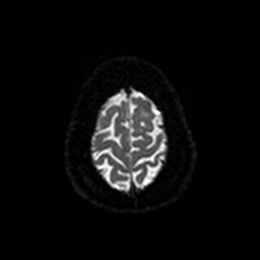
[im 92/92]
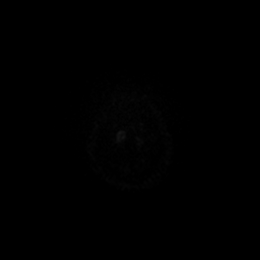

[Series 101: DWI · axial · 3.0mm · 0.88mm/px · z∈[-78,+55]mm · 4 of 46 slices shown (4 of 4)]
[im 1/46]
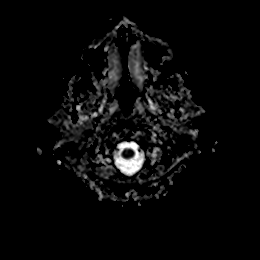
[im 16/46]
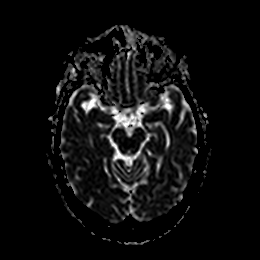
[im 31/46]
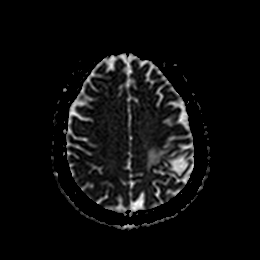
[im 46/46]
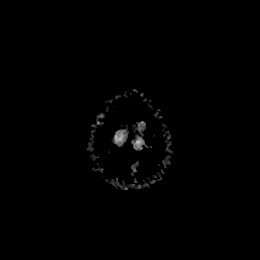

[Series 102: T2 · axial · 5.0mm · 0.72mm/px · z∈[-81,+61]mm · 2 of 25 slices shown (1 of 2)]
[im 1/25]
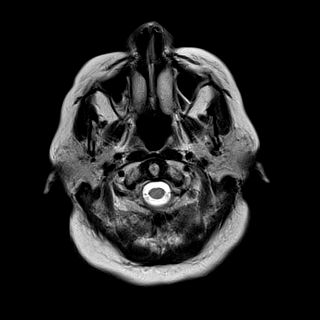
[im 25/25]
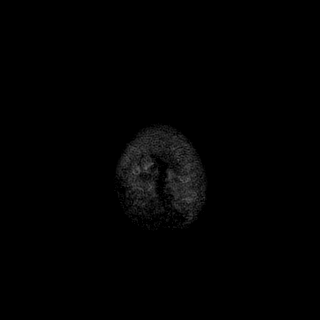

[Series 103: FLAIR · axial · 5.0mm · 0.45mm/px · z∈[-81,+60]mm · 2 of 25 slices shown]
[im 1/25]
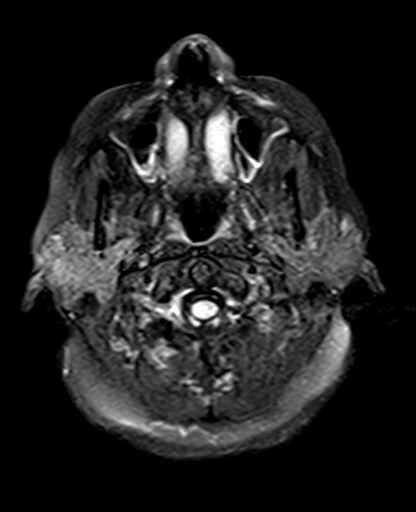
[im 25/25]
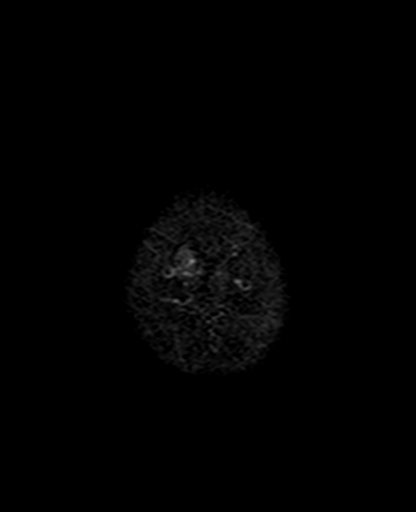

[Series 104: pha_images · axial · 3.0mm · 0.90mm/px · z∈[-103,+69]mm · 5 of 59 slices shown]
[im 1/59]
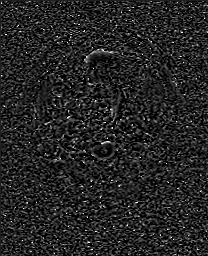
[im 15/59]
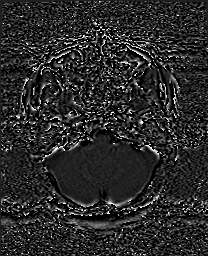
[im 30/59]
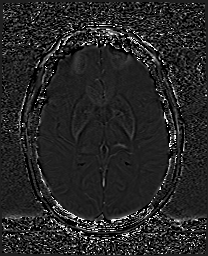
[im 44/59]
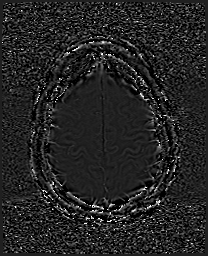
[im 59/59]
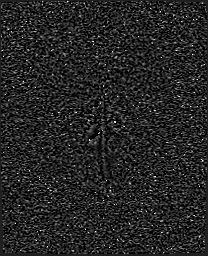

[Series 105: swi_images · axial · 3.0mm · 0.90mm/px · z∈[-103,+72]mm · 6 of 60 slices shown]
[im 1/60]
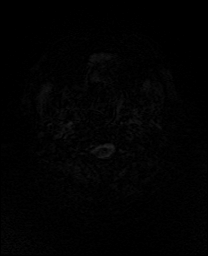
[im 12/60]
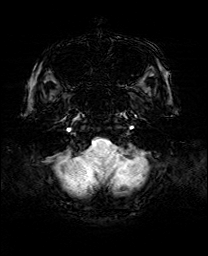
[im 24/60]
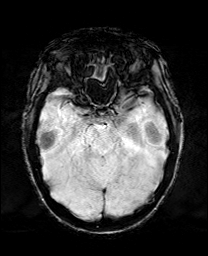
[im 36/60]
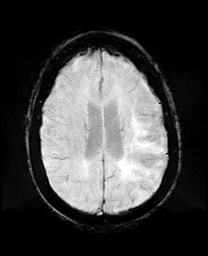
[im 48/60]
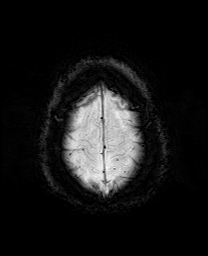
[im 60/60]
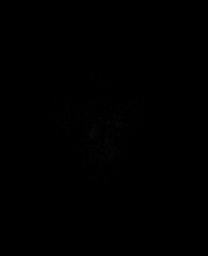

[Series 107: T2 · coronal · 5.0mm · 0.34mm/px · 3 of 29 slices shown (2 of 2)]
[im 1/29]
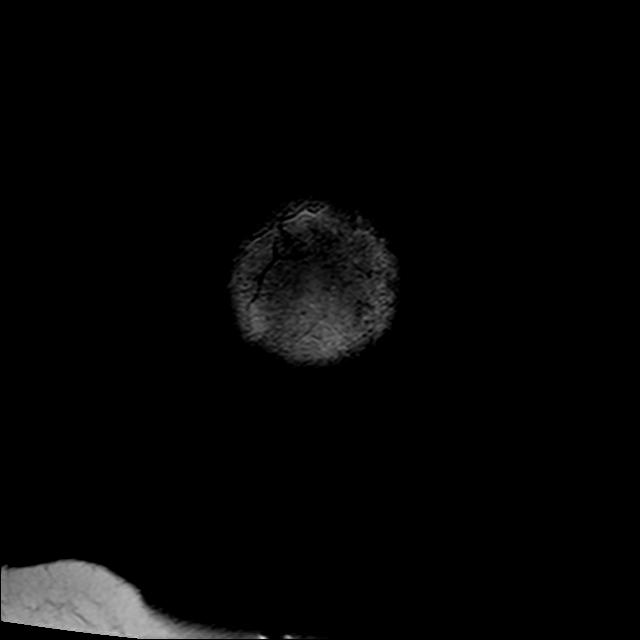
[im 15/29]
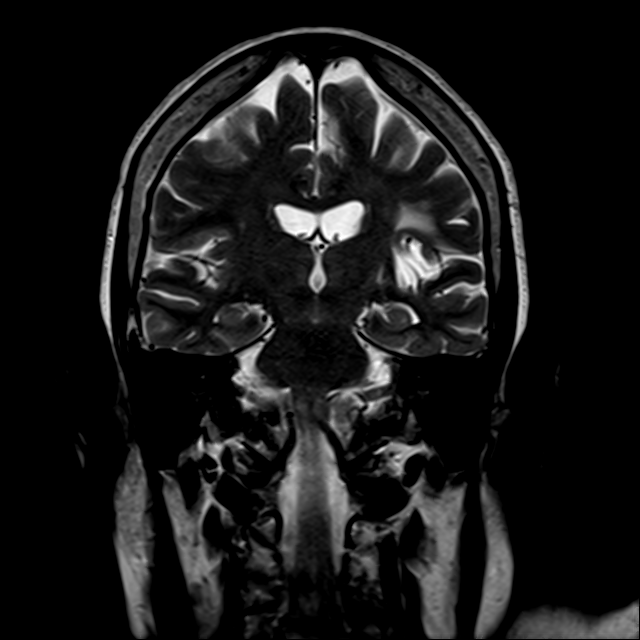
[im 29/29]
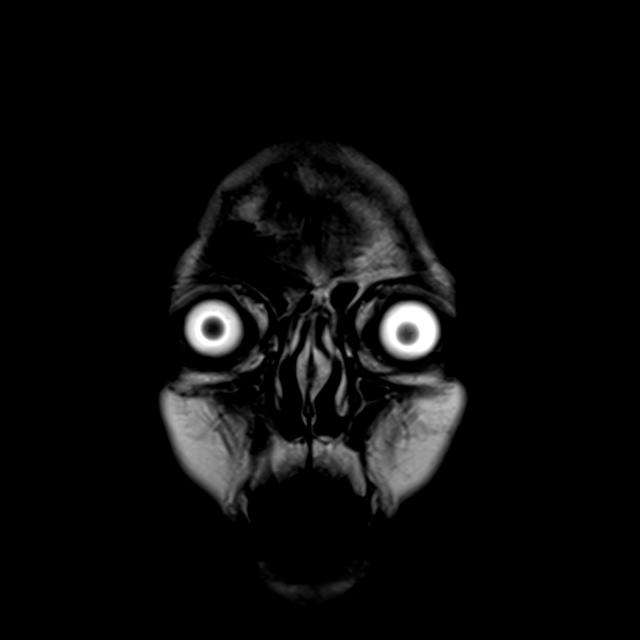

[42 of 48 positions shown; findings below may reference images not displayed]

FINDINGS: BRAIN: No acute infarct, acute hemorrhage or extra-axial collection.
Old left frontoparietal infarct. White matter signal otherwise
normal. Normal volume of brain parenchyma and CSF spaces. Midline
structures are normal.

VASCULAR: Major flow voids are preserved. Susceptibility-sensitive
sequences show no chronic microhemorrhage or superficial siderosis.

SKULL AND UPPER CERVICAL SPINE: Normal calvarium and skull base.
Visualized upper cervical spine and soft tissues are normal.

SINUSES/ORBITS: Prior functional endoscopic sinus surgery. No
mastoid or middle ear effusion. Normal orbits.
IMPRESSION: 1. No acute intracranial process.
2. Old left frontoparietal infarct.

## 2021-01-03 ENCOUNTER — Ambulatory Visit: Payer: PPO | Admitting: Podiatry

## 2021-01-05 ENCOUNTER — Other Ambulatory Visit: Payer: Self-pay

## 2021-01-05 ENCOUNTER — Ambulatory Visit: Payer: PPO | Admitting: Podiatry

## 2021-01-05 ENCOUNTER — Encounter: Payer: Self-pay | Admitting: Podiatry

## 2021-01-05 DIAGNOSIS — B351 Tinea unguium: Secondary | ICD-10-CM

## 2021-01-05 DIAGNOSIS — L84 Corns and callosities: Secondary | ICD-10-CM | POA: Diagnosis not present

## 2021-01-05 DIAGNOSIS — M79671 Pain in right foot: Secondary | ICD-10-CM | POA: Diagnosis not present

## 2021-01-05 DIAGNOSIS — M79676 Pain in unspecified toe(s): Secondary | ICD-10-CM | POA: Diagnosis not present

## 2021-01-05 DIAGNOSIS — M79672 Pain in left foot: Secondary | ICD-10-CM

## 2021-01-11 NOTE — Progress Notes (Signed)
Subjective: Andrea Mason presents today for follow up of painful thick toenails that are difficult to trim. Pain interferes with ambulation. Aggravating factors include wearing enclosed shoe gear. Pain is relieved with periodic professional debridement.   She voices no new pedal concerns on today's visit.  Allergies  Allergen Reactions  . Ivp Dye [Iodinated Diagnostic Agents] Nausea And Vomiting and Other (See Comments)    hot flashes  . Promethazine-Codeine Nausea And Vomiting  . Tramadol Other (See Comments)    Almost passed out    Objective: There were no vitals filed for this visit.  Pt is a pleasant 70 y.o. year old AA  female  in NAD. AAO x 3.   Vascular Examination:  Capillary refill time to digits immediate b/l. Palpable DP pulses b/l. Palpable PT pulses b/l. Pedal hair present b/l. Skin temperature gradient within normal limits b/l. No edema noted b/l.  Dermatological Examination: Pedal skin with normal turgor, texture and tone bilaterally. No open wounds bilaterally. No interdigital macerations bilaterally. Toenails 1-5 b/l elongated, dystrophic, thickened, crumbly with subungual debris and tenderness to dorsal palpation. Hyperkeratotic lesion submet head 1 right foot, submet head 5 right foot and b/l hallux. No edema, no erythema, no drainage, no flocculence.  Musculoskeletal: Normal muscle strength 5/5 to all lower extremity muscle groups bilaterally. No pain crepitus or joint limitation noted with ROM b/l. Hallux valgus with bunion deformity noted b/l lower extremities. Hammertoes noted to the b/l lower extremities.  Neurological: Protective sensation intact 5/5 intact bilaterally with 10g monofilament b/l. Vibratory sensation intact b/l. Proprioception intact bilaterally. Babinski reflex negative b/l. Clonus negative b/l.  Assessment: 1. Pain due to onychomycosis of toenail   2. Callus   3. Pain in both feet    Plan: -Toenails 1-5 b/l were debrided in length and  girth with sterile nail nippers and dremel without iatrogenic bleeding.  -Calluses pared submetatarsal head 1, 5 right foot and b/l hallux (s) utilizing sterile scalpel blade without incident. -Patient to continue soft, supportive shoe gear daily. -Patient to report any pedal injuries to medical professional immediately. -Patient/POA to call should there be question/concern in the interim.  Return in about 3 months (around 04/04/2021) for nail trim.

## 2021-03-19 ENCOUNTER — Other Ambulatory Visit: Payer: Self-pay | Admitting: Internal Medicine

## 2021-03-19 NOTE — Telephone Encounter (Signed)
Please refill as per office routine med refill policy (all routine meds refilled for 3 mo or monthly per pt preference up to one year from last visit, then month to month grace period for 3 mo, then further med refills will have to be denied)  

## 2021-03-22 ENCOUNTER — Other Ambulatory Visit: Payer: Self-pay

## 2021-03-23 ENCOUNTER — Ambulatory Visit: Payer: PPO | Admitting: Internal Medicine

## 2021-03-24 ENCOUNTER — Telehealth: Payer: Self-pay | Admitting: Internal Medicine

## 2021-03-24 MED ORDER — FESOTERODINE FUMARATE ER 8 MG PO TB24: 8.0000 mg | ORAL_TABLET | Freq: Every day | ORAL | 3 refills | Status: DC

## 2021-03-24 NOTE — Telephone Encounter (Signed)
Ok this is done erx 

## 2021-03-24 NOTE — Telephone Encounter (Signed)
Spoke with patient and patient states that she received a letter from insurance stating that Winside 8mg  would be covered. Patient would like to know if she can switch back to this instead of the Darifenacin. Please advise

## 2021-03-24 NOTE — Telephone Encounter (Signed)
Patient called and said that she got a letter in regards to her Medicare Part D prescription plan. She had some questions about some of her medications. She can be reached at [phone]. Please advise

## 2021-03-25 NOTE — Telephone Encounter (Signed)
Notified patient on voicemail

## 2021-03-28 NOTE — Telephone Encounter (Signed)
Patient states the Lisbeth Ply is going to be 200 dollars. She would like to just get the darifenacin (ENABLEX) 7.5 MG 24 hr tablet

## 2021-03-29 NOTE — Telephone Encounter (Signed)
Follow up message    Patient calling to report fesoterodine (TOVIAZ) 8 MG TB24 tablet to costly, requesting alternative

## 2021-03-30 MED ORDER — DARIFENACIN HYDROBROMIDE ER 7.5 MG PO TB24: 7.5000 mg | ORAL_TABLET | Freq: Every day | ORAL | 3 refills | Status: DC

## 2021-03-30 NOTE — Telephone Encounter (Signed)
darifenacin (ENABLEX) 7.5 MG 24 hr tablet  Patient calling screaming, states the medication above is covered and demanding we send the medication in for her.

## 2021-03-30 NOTE — Telephone Encounter (Signed)
BUT she told us specifically the Andrea Mason was covered by her insurance, since the vesicare was too expensive  Is she really sure this time?

## 2021-03-30 NOTE — Addendum Note (Signed)
Addended by: Biagio Borg on: 03/30/2021 05:20 PM   Modules accepted: Orders

## 2021-03-30 NOTE — Telephone Encounter (Signed)
Left patient a voicemail to return office call in regards to what is covered by her insurance.

## 2021-04-05 ENCOUNTER — Other Ambulatory Visit: Payer: Self-pay

## 2021-04-05 ENCOUNTER — Telehealth: Payer: Self-pay | Admitting: Internal Medicine

## 2021-04-05 ENCOUNTER — Ambulatory Visit: Payer: PPO

## 2021-04-05 ENCOUNTER — Encounter: Payer: Self-pay | Admitting: Internal Medicine

## 2021-04-05 ENCOUNTER — Ambulatory Visit (INDEPENDENT_AMBULATORY_CARE_PROVIDER_SITE_OTHER): Payer: PPO | Admitting: Internal Medicine

## 2021-04-05 VITALS — BP 122/72 | HR 71 | Temp 98.4°F | Ht 62.0 in | Wt 186.6 lb

## 2021-04-05 DIAGNOSIS — E538 Deficiency of other specified B group vitamins: Secondary | ICD-10-CM

## 2021-04-05 DIAGNOSIS — B370 Candidal stomatitis: Secondary | ICD-10-CM

## 2021-04-05 DIAGNOSIS — Z0000 Encounter for general adult medical examination without abnormal findings: Secondary | ICD-10-CM

## 2021-04-05 DIAGNOSIS — R739 Hyperglycemia, unspecified: Secondary | ICD-10-CM

## 2021-04-05 DIAGNOSIS — Z1231 Encounter for screening mammogram for malignant neoplasm of breast: Secondary | ICD-10-CM

## 2021-04-05 DIAGNOSIS — E559 Vitamin D deficiency, unspecified: Secondary | ICD-10-CM | POA: Diagnosis not present

## 2021-04-05 LAB — CBC WITH DIFFERENTIAL/PLATELET
Basophils Absolute: 0.1 10*3/uL (ref 0.0–0.1)
Basophils Relative: 1.4 % (ref 0.0–3.0)
Eosinophils Absolute: 1.4 10*3/uL — ABNORMAL HIGH (ref 0.0–0.7)
Eosinophils Relative: 24 % — ABNORMAL HIGH (ref 0.0–5.0)
HCT: 34.9 % — ABNORMAL LOW (ref 36.0–46.0)
Hemoglobin: 11.8 g/dL — ABNORMAL LOW (ref 12.0–15.0)
Lymphocytes Relative: 29.7 % (ref 12.0–46.0)
Lymphs Abs: 1.8 10*3/uL (ref 0.7–4.0)
MCHC: 33.7 g/dL (ref 30.0–36.0)
MCV: 90.9 fl (ref 78.0–100.0)
Monocytes Absolute: 0.4 10*3/uL (ref 0.1–1.0)
Monocytes Relative: 7.3 % (ref 3.0–12.0)
Neutro Abs: 2.3 10*3/uL (ref 1.4–7.7)
Neutrophils Relative %: 38.3 % — ABNORMAL LOW (ref 43.0–77.0)
Platelets: 204 10*3/uL (ref 150.0–400.0)
RBC: 3.84 Mil/uL — ABNORMAL LOW (ref 3.87–5.11)
RDW: 13.8 % (ref 11.5–15.5)
WBC: 6 10*3/uL (ref 4.0–10.5)

## 2021-04-05 LAB — BASIC METABOLIC PANEL
BUN: 15 mg/dL (ref 6–23)
CO2: 30 mEq/L (ref 19–32)
Calcium: 9.4 mg/dL (ref 8.4–10.5)
Chloride: 101 mEq/L (ref 96–112)
Creatinine, Ser: 1.05 mg/dL (ref 0.40–1.20)
GFR: 54.7 mL/min — ABNORMAL LOW (ref >60.00)
Glucose, Bld: 83 mg/dL (ref 70–99)
Potassium: 4.3 mEq/L (ref 3.5–5.1)
Sodium: 136 mEq/L (ref 135–145)

## 2021-04-05 LAB — LIPID PANEL
Cholesterol: 82 mg/dL (ref 0–200)
HDL: 49.6 mg/dL (ref >39.00)
LDL Cholesterol: 16 mg/dL (ref 0–99)
NonHDL: 32.18
Total CHOL/HDL Ratio: 2
Triglycerides: 82 mg/dL (ref 0.0–149.0)
VLDL: 16.4 mg/dL (ref 0.0–40.0)

## 2021-04-05 LAB — HEMOGLOBIN A1C: Hgb A1c MFr Bld: 6 % (ref 4.6–6.5)

## 2021-04-05 LAB — HEPATIC FUNCTION PANEL
ALT: 15 U/L (ref 0–35)
AST: 19 U/L (ref 0–37)
Albumin: 4 g/dL (ref 3.5–5.2)
Alkaline Phosphatase: 107 U/L (ref 39–117)
Bilirubin, Direct: 0.2 mg/dL (ref 0.0–0.3)
Total Bilirubin: 0.7 mg/dL (ref 0.2–1.2)
Total Protein: 7.5 g/dL (ref 6.0–8.3)

## 2021-04-05 LAB — TSH: TSH: 1.54 u[IU]/mL (ref 0.35–4.50)

## 2021-04-05 MED ORDER — CLOTRIMAZOLE 10 MG MT TROC: 10.0000 mg | Freq: Every day | OROMUCOSAL | 1 refills | Status: DC

## 2021-04-05 NOTE — Telephone Encounter (Signed)
LVM for pt to rtn my call to r/s appt with nha on 04/05/21.

## 2021-04-05 NOTE — Progress Notes (Signed)
Patient ID: Andrea Mason, female   DOB: Aug 09, 1951, 70 y.o.   MRN: [id number]         Chief Complaint:: wellness exam        HPI:  Andrea Mason is a 70 y.o. female here for wellness exam; due for mammogram, o/w up to date with preventive referrals and immunizations.                        Also has whitish coating to tongue, asks for mycelex troche which has worked in the past.  Pt denies chest pain, increased sob or doe, wheezing, orthopnea, PND, increased LE swelling, palpitations, dizziness or syncope.   Pt denies polydipsia, polyuria, or new focal neuro s/s.    Pt denies fever, wt loss, night sweats, loss of appetite, or other constitutional symptoms  No other new complaints   Wt Readings from Last 3 Encounters:  04/05/21 186 lb 9.6 oz (84.6 kg)  10/15/20 191 lb 9.6 oz (86.9 kg)  10/06/20 191 lb (86.6 kg)   BP Readings from Last 3 Encounters:  04/05/21 122/72  10/15/20 120/72  10/06/20 120/80   Immunization History  Administered Date(s) Administered  . Influenza Split 09/03/2012  . Influenza Whole 09/27/2006, 08/23/2007, 11/16/2009, 08/25/2010  . Influenza,inj,Quad PF,6+ Mos 08/21/2013, 08/06/2014, 10/05/2015  . PFIZER(Purple Top)SARS-COV-2 Vaccination 03/04/2020, 03/29/2020  . Pneumococcal Conjugate-13 12/02/2014  . Pneumococcal Polysaccharide-23 06/03/2019  . Tdap 05/06/2013, 08/25/2015  . Zoster 05/06/2013  There are no preventive care reminders to display for this patient.    Past Medical History:  Diagnosis Date  . Allergic rhinitis   . Allergy   . Asthma   . Depression   . DJD (degenerative joint disease), cervical   . GERD (gastroesophageal reflux disease)   . History of colonic polyps   . Hypertension   . Obesity   . 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 Stroke ([place])   . Syncope    Past Surgical History:  Procedure Laterality Date  . COLONOSCOPY    . FOOT SURGERY  1998  . Implantable loop recorder placement  09/16/14   MDT LINQ implanted by Dr  Rayann Heman for cryptogenic stroke, RIO II protocol  . LOOP RECORDER REMOVAL N/A 10/02/2017   Procedure: LOOP RECORDER REMOVAL;  Surgeon: [name], MD;  Location: [facility];  Service: Cardiovascular;  Laterality: N/A;  . NASAL POLYP SURGERY  07/2006   x 2 with Dr. [name]  . NASAL SINUS SURGERY  07/2006   Dr. [name]  . TUBAL LIGATION      reports that she has never smoked. She has never used smokeless tobacco. She reports current alcohol use. She reports that she does not use drugs. family history includes Arrhythmia in her brother; Cancer in her mother; Colon cancer in her brother; Congenital heart disease in her brother; Diabetes in her sister; Heart attack in her brother; Lung disease in her son; Stroke in her brother. Allergies  Allergen Reactions  . Ivp Dye [Iodinated Diagnostic Agents] Nausea And Vomiting and Other (See Comments)    hot flashes  . Promethazine-Codeine Nausea And Vomiting  . Tramadol Other (See Comments)    Almost passed out   Current Outpatient Medications on File Prior to Visit  Medication Sig Dispense Refill  . acetaminophen (TYLENOL) 325 MG tablet Take as directed per bottle instructions    . albuterol (VENTOLIN HFA) 108 (90 Base) MCG/ACT inhaler Inhale 1-2 puffs into the lungs every 6 (six) hours as needed  for wheezing or shortness of breath.     Marland Kitchen aspirin 325 MG tablet Take 325 mg by mouth daily.    Marland Kitchen atorvastatin (LIPITOR) 10 MG tablet TAKE 1 TABLET(10 MG) BY MOUTH DAILY 30 tablet 0  . baclofen (LIORESAL) 10 MG tablet Take 5 mg by mouth every 6 (six) hours as needed for muscle spasms.    . Biotin 10000 MCG TABS Take 10,000 mcg by mouth daily.    . budesonide-formoterol (SYMBICORT) 80-4.5 MCG/ACT inhaler Inhale 2 puffs into the lungs 2 (two) times daily. 10.2 g 11  . cetirizine (ZYRTEC) 10 MG tablet Take 10 mg by mouth at bedtime.     . Cholecalciferol (VITAMIN D3) 2000 units TABS Take 2,000 Units by mouth daily.    Marland Kitchen darifenacin (ENABLEX) 7.5 MG  24 hr tablet Take 1 tablet (7.5 mg total) by mouth daily. 90 tablet 3  . dextromethorphan-guaiFENesin (MUCINEX DM) 30-600 MG 12hr tablet Take 1-2 tablets by mouth 2 (two) times daily as needed (for congestion/cough (WITH FLUTTER VALVE)).     . famotidine (PEPCID) 20 MG tablet Take 1 tablet (20 mg total) by mouth at bedtime. 90 tablet 3  . fluticasone (FLONASE) 50 MCG/ACT nasal spray Place into both nostrils daily.    Marland Kitchen lidocaine (LIDODERM) 5 % Place 1 patch onto the skin daily. Apply to painful area of chest, remove & discard patch every 12hrs allowing 12hr without patch present 30 patch 0  . Magnesium 250 MG TABS Take 1 tablet by mouth daily.    . montelukast (SINGULAIR) 10 MG tablet TAKE 1 TABLET(10 MG) BY MOUTH AT BEDTIME 30 tablet 11  . oxymetazoline (AFRIN) 0.05 % nasal spray Place 2 sprays into both nostrils 2 (two) times daily as needed (nasal stuffiness and nasal pressure for 5 days).     . pantoprazole (PROTONIX) 40 MG tablet TAKE 1 TABLET(40 MG) BY MOUTH DAILY 90 tablet 1  . Polyethylene Glycol 3350 (MIRALAX PO) 1 capful daily as needed    . Respiratory Therapy Supplies (FLUTTER) DEVI Use as directed    . sodium chloride (OCEAN) 0.65 % SOLN nasal spray Place 2 sprays into both nostrils as needed (dry nose).     Marland Kitchen spironolactone (ALDACTONE) 25 MG tablet Take 25 mg by mouth daily.     . mometasone (NASONEX) 50 MCG/ACT nasal spray Place 2 sprays into the nose 2 (two) times daily. (Patient not taking: Reported on 04/05/2021)    . triamcinolone (NASACORT) 55 MCG/ACT AERO nasal inhaler Place 2 sprays into the nose daily. (Patient not taking: Reported on 04/05/2021) 1 Inhaler 12   No current facility-administered medications on file prior to visit.        ROS:  All others reviewed and negative.  Objective        PE:  BP 122/72 (BP Location: Right Arm, Patient Position: Sitting, Cuff Size: Large)   Pulse 71   Temp 98.4 F (36.9 C) (Oral)   Ht 5\' 2"  (1.575 m)   Wt 186 lb 9.6 oz (84.6 kg)    LMP  (LMP Unknown)   SpO2 98%   BMI 34.13 kg/m                 Constitutional: Pt appears in NAD               HENT: Head: NCAT.                Right Ear: External ear normal.  Left Ear: External ear normal.                Eyes: . Pupils are equal, round, and reactive to light. Conjunctivae and EOM are normal               Nose: without d/c or deformity               Neck: Neck supple. Gross normal ROM               Cardiovascular: Normal rate and regular rhythm.                 Pulmonary/Chest: Effort normal and breath sounds without rales or wheezing.                Abd:  Soft, NT, ND, + BS, no organomegaly               Neurological: Pt is alert. At baseline orientation, motor grossly intact               Skin: Skin is warm. No rashes, no other new lesions, LE edema - none               Psychiatric: Pt behavior is normal without agitation   Micro: none  Cardiac tracings I have personally interpreted today:  none  Pertinent Radiological findings (summarize): none   Lab Results  Component Value Date   WBC 6.0 04/05/2021   HGB 11.8 (L) 04/05/2021   HCT 34.9 (L) 04/05/2021   PLT 204.0 04/05/2021   GLUCOSE 83 04/05/2021   CHOL 82 04/05/2021   TRIG 82.0 04/05/2021   HDL 49.60 04/05/2021   LDLCALC 16 04/05/2021   ALT 15 04/05/2021   AST 19 04/05/2021   NA 136 04/05/2021   K 4.3 04/05/2021   CL 101 04/05/2021   CREATININE 1.05 04/05/2021   BUN 15 04/05/2021   CO2 30 04/05/2021   TSH 1.54 04/05/2021   INR 1.0 12/26/2019   HGBA1C 6.0 04/05/2021   Assessment/Plan:  Andrea Mason is a 70 y.o. Black or African American [2] female with  has a past medical history of Allergic rhinitis, Allergy, Asthma, Depression, DJD (degenerative joint disease), cervical, GERD (gastroesophageal reflux disease), History of colonic polyps, Hypertension, Obesity, Primary hyperaldosteronism ([place]) (11/06/2013), Stroke ([place]), and Syncope.  Preventative health care Age and sex  appropriate education and counseling updated with regular exercise and diet Referrals for preventative services - for mammogram Immunizations addressed - none needed Smoking counseling  - none needed Evidence for depression or other mood disorder - none significant Most recent labs reviewed. I have personally reviewed and have noted: 1) the patient's medical and social history 2) The patient's current medications and supplements 3) The patient's height, weight, and BMI have been recorded in the chart   Hyperglycemia Lab Results  Component Value Date   HGBA1C 6.0 04/05/2021   Stable, pt to continue current medical treatment - diet   Vitamin D deficiency Last vitamin D Lab Results  Component Value Date   VD25OH 38.05 04/05/2021   Stable, cont oral replacement   Thrush For mycelex troche asd prn  Followup: Return in about 6 months (around 10/06/2021).  Cathlean Cower, MD 04/12/2021 8:17 PM [facility]

## 2021-04-05 NOTE — Patient Instructions (Signed)
Please take all new medication as prescribed - the mycelex troches as needed for thrush  You will be contacted regarding the referral for: mammogram  Please continue all other medications as before, and refills have been done if requested.  Please have the pharmacy call with any other refills you may need.  Please continue your efforts at being more active, low cholesterol diet, and weight control.  You are otherwise up to date with prevention measures today.  Please keep your appointments with your specialists as you may have planned  Please go to the LAB at the blood drawing area for the tests to be done  You will be contacted by phone if any changes need to be made immediately.  Otherwise, you will receive a letter about your results with an explanation, but please check with MyChart first.  Please remember to sign up for MyChart if you have not done so, as this will be important to you in the future with finding out test results, communicating by private email, and scheduling acute appointments online when needed.  Please make an Appointment to return in 6 months, or sooner if needed

## 2021-04-06 ENCOUNTER — Encounter: Payer: Self-pay | Admitting: Internal Medicine

## 2021-04-06 ENCOUNTER — Other Ambulatory Visit: Payer: Self-pay | Admitting: Internal Medicine

## 2021-04-06 LAB — URINALYSIS, ROUTINE W REFLEX MICROSCOPIC
Bilirubin Urine: NEGATIVE
Hgb urine dipstick: NEGATIVE
Nitrite: NEGATIVE
RBC / HPF: NONE SEEN (ref 0–?)
Specific Gravity, Urine: 1.015 (ref 1.000–1.030)
Total Protein, Urine: NEGATIVE
Urine Glucose: NEGATIVE
Urobilinogen, UA: 0.2 (ref 0.0–1.0)
pH: 6 (ref 5.0–8.0)

## 2021-04-06 LAB — VITAMIN D 25 HYDROXY (VIT D DEFICIENCY, FRACTURES): VITD: 38.05 ng/mL (ref 30.00–100.00)

## 2021-04-06 LAB — VITAMIN B12: Vitamin B-12: >1550 pg/mL — ABNORMAL HIGH (ref 211–911)

## 2021-04-06 MED ORDER — CEPHALEXIN 500 MG PO CAPS: 500.0000 mg | ORAL_CAPSULE | Freq: Three times a day (TID) | ORAL | 0 refills | Status: AC

## 2021-04-12 ENCOUNTER — Encounter: Payer: Self-pay | Admitting: Internal Medicine

## 2021-04-12 DIAGNOSIS — B370 Candidal stomatitis: Secondary | ICD-10-CM | POA: Insufficient documentation

## 2021-04-12 NOTE — Assessment & Plan Note (Signed)
Last vitamin D Lab Results  Component Value Date   VD25OH 38.05 04/05/2021   Stable, cont oral replacement

## 2021-04-12 NOTE — Assessment & Plan Note (Signed)
Age and sex appropriate education and counseling updated with regular exercise and diet Referrals for preventative services - for mammogram Immunizations addressed - none needed Smoking counseling  - none needed Evidence for depression or other mood disorder - none significant Most recent labs reviewed. I have personally reviewed and have noted: 1) the patient's medical and social history 2) The patient's current medications and supplements 3) The patient's height, weight, and BMI have been recorded in the chart

## 2021-04-12 NOTE — Assessment & Plan Note (Signed)
For mycelex troche asd prn

## 2021-04-12 NOTE — Assessment & Plan Note (Signed)
Lab Results  Component Value Date   HGBA1C 6.0 04/05/2021   Stable, pt to continue current medical treatment - diet

## 2021-04-15 ENCOUNTER — Ambulatory Visit (INDEPENDENT_AMBULATORY_CARE_PROVIDER_SITE_OTHER): Payer: PPO | Admitting: Primary Care

## 2021-04-15 ENCOUNTER — Other Ambulatory Visit: Payer: Self-pay

## 2021-04-15 ENCOUNTER — Encounter: Payer: Self-pay | Admitting: Primary Care

## 2021-04-15 VITALS — BP 112/74 | HR 79 | Temp 98.0°F | Ht 62.0 in | Wt 186.0 lb

## 2021-04-15 DIAGNOSIS — J45991 Cough variant asthma: Secondary | ICD-10-CM | POA: Diagnosis not present

## 2021-04-15 DIAGNOSIS — J45901 Unspecified asthma with (acute) exacerbation: Secondary | ICD-10-CM | POA: Diagnosis not present

## 2021-04-15 MED ORDER — METHYLPREDNISOLONE ACETATE 80 MG/ML IJ SUSP
60.0000 mg | Freq: Once | INTRAMUSCULAR | Status: AC
  Administered 2021-04-15: 60 mg via INTRAMUSCULAR

## 2021-04-15 MED ORDER — PREDNISONE 10 MG PO TABS: ORAL_TABLET | ORAL | 0 refills | Status: DC

## 2021-04-15 NOTE — Progress Notes (Signed)
@Patient  ID: Andrea Mason, female    DOB: [birth year], 70 y.o.   MRN: [id number]  Chief Complaint  Patient presents with  . Follow-up    Reports wheezing for past few weeks.     Referring provider: Biagio Borg, MD  HPI: 70 year old female, never smoked.  Past medical history significant for cough variant asthma.  Patient of Dr. Melvyn Novas, last seen in office on 10/15/2020.  Previous [facility] pulmonary encounter: 10/15/2020  f/u ov/Wert re: asthma/ chronic rhinitis  using med calendar well/ maint on symb 47 2bid  Chief Complaint  Patient presents with  . Follow-up    pt states having clear mucus in mouth   Dyspnea: planning going to gym / walking neighborhood x one hour  With hills x 1 h but not lately Cough: spoardic / daytime/ assoc pnds s purulent sputum Sleeping: bed is flat/ sev pillows / no resp complaints  SABA use: none  02: none   No obvious day to day or daytime variability or assoc  purulent sputum or mucus plugs or hemoptysis or cp or chest tightness, subjective wheeze or overt sinus or hb symptoms.   Sleeping fine  without nocturnal  or early am exacerbation  of respiratory  c/o's or need for noct saba. Also denies any obvious fluctuation of symptoms with weather or environmental changes or other aggravating or alleviating factors except as outlined above   No unusual exposure hx or h/o childhood pna/ asthma or knowledge of premature birth.  04/15/2021- Acute visit d/t wheezing  Patient presents today for acute office visit.  She reports wheezing symptoms over the last week or two.  She is maintained on Symbicort 80 two puffs twice daily, Singulair 10mg  at bedtime, Flonase nasal spray and flutter valve. She does not have a cough. She is getting light green mucus out of her sinuses. She has been needing to use her albuteorl rescue inhaler 3-4 times a day d/t wheezing.   Dyspnea: None; occasionally with inclines  Cough: None Sleeping: Uses several pillow  SABA: 3-4 times a day  d/t wheezing  O2: None    Allergies  Allergen Reactions  . Ivp Dye [Iodinated Diagnostic Agents] Nausea And Vomiting and Other (See Comments)    hot flashes  . Promethazine-Codeine Nausea And Vomiting  . Tramadol Other (See Comments)    Almost passed out    Immunization History  Administered Date(s) Administered  . Influenza Split 09/03/2012  . Influenza Whole 09/27/2006, 08/23/2007, 11/16/2009, 08/25/2010  . Influenza,inj,Quad PF,6+ Mos 08/21/2013, 08/06/2014, 10/05/2015  . PFIZER(Purple Top)SARS-COV-2 Vaccination 03/04/2020, 03/29/2020  . Pneumococcal Conjugate-13 12/02/2014  . Pneumococcal Polysaccharide-23 06/03/2019  . Tdap 05/06/2013, 08/25/2015  . Zoster 05/06/2013    Past Medical History:  Diagnosis Date  . Allergic rhinitis   . Allergy   . Asthma   . Depression   . DJD (degenerative joint disease), cervical   . GERD (gastroesophageal reflux disease)   . History of colonic polyps   . Hypertension   . Obesity   . 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 Stroke ([place])   . Syncope     Tobacco History: Social History   Tobacco Use  Smoking Status Never Smoker  Smokeless Tobacco Never Used   Counseling given: Not Answered   Outpatient Medications Prior to Visit  Medication Sig Dispense Refill  . acetaminophen (TYLENOL) 325 MG tablet Take as directed per bottle instructions    . albuterol (VENTOLIN HFA) 108 (90 Base) MCG/ACT inhaler Inhale 1-2 puffs into the  lungs every 6 (six) hours as needed for wheezing or shortness of breath.     Marland Kitchen aspirin 325 MG tablet Take 325 mg by mouth daily.    Marland Kitchen atorvastatin (LIPITOR) 10 MG tablet TAKE 1 TABLET(10 MG) BY MOUTH DAILY 30 tablet 0  . baclofen (LIORESAL) 10 MG tablet Take 5 mg by mouth every 6 (six) hours as needed for muscle spasms.    . Biotin 10000 MCG TABS Take 10,000 mcg by mouth daily.    . budesonide-formoterol (SYMBICORT) 80-4.5 MCG/ACT inhaler Inhale 2 puffs into the lungs 2 (two) times daily. 10.2 g 11   . cephALEXin (KEFLEX) 500 MG capsule Take 1 capsule (500 mg total) by mouth 3 (three) times daily for 10 days. 30 capsule 0  . cetirizine (ZYRTEC) 10 MG tablet Take 10 mg by mouth at bedtime.     . Cholecalciferol (VITAMIN D3) 2000 units TABS Take 2,000 Units by mouth daily.    . clotrimazole (MYCELEX) 10 MG troche Take 1 tablet (10 mg total) by mouth 5 (five) times daily. 60 Troche 1  . darifenacin (ENABLEX) 7.5 MG 24 hr tablet Take 1 tablet (7.5 mg total) by mouth daily. 90 tablet 3  . dextromethorphan-guaiFENesin (MUCINEX DM) 30-600 MG 12hr tablet Take 1-2 tablets by mouth 2 (two) times daily as needed (for congestion/cough (WITH FLUTTER VALVE)).     . famotidine (PEPCID) 20 MG tablet Take 1 tablet (20 mg total) by mouth at bedtime. 90 tablet 3  . fluticasone (FLONASE) 50 MCG/ACT nasal spray Place into both nostrils daily.    . Magnesium 250 MG TABS Take 1 tablet by mouth daily.    . montelukast (SINGULAIR) 10 MG tablet TAKE 1 TABLET(10 MG) BY MOUTH AT BEDTIME 30 tablet 11  . oxymetazoline (AFRIN) 0.05 % nasal spray Place 2 sprays into both nostrils 2 (two) times daily as needed (nasal stuffiness and nasal pressure for 5 days).     . pantoprazole (PROTONIX) 40 MG tablet TAKE 1 TABLET(40 MG) BY MOUTH DAILY 90 tablet 1  . Polyethylene Glycol 3350 (MIRALAX PO) 1 capful daily as needed    . Respiratory Therapy Supplies (FLUTTER) DEVI Use as directed    . sodium chloride (OCEAN) 0.65 % SOLN nasal spray Place 2 sprays into both nostrils as needed (dry nose).     Marland Kitchen spironolactone (ALDACTONE) 25 MG tablet Take 25 mg by mouth daily.     Marland Kitchen lidocaine (LIDODERM) 5 % Place 1 patch onto the skin daily. Apply to painful area of chest, remove & discard patch every 12hrs allowing 12hr without patch present (Patient not taking: Reported on 04/15/2021) 30 patch 0  . mometasone (NASONEX) 50 MCG/ACT nasal spray Place 2 sprays into the nose 2 (two) times daily. (Patient not taking: Reported on 04/15/2021)    .  triamcinolone (NASACORT) 55 MCG/ACT AERO nasal inhaler Place 2 sprays into the nose daily. (Patient not taking: No sig reported) 1 Inhaler 12   No facility-administered medications prior to visit.   Review of Systems  Review of Systems  HENT: Positive for congestion and postnasal drip. Negative for sinus pressure and sinus pain.   Respiratory: Positive for wheezing. Negative for cough and chest tightness.    Physical Exam  BP 112/74 (BP Location: Left Arm, Cuff Size: Normal)   Pulse 79   Temp 98 F (36.7 C) (Temporal)   Ht 5\' 2"  (1.575 m)   Wt 186 lb (84.4 kg)   LMP  (LMP Unknown)   SpO2 100%  Comment: RA  BMI 34.02 kg/m  Physical Exam Constitutional:      Appearance: Normal appearance.  HENT:     Head: Normocephalic and atraumatic.     Nose:     Right Sinus: No maxillary sinus tenderness or frontal sinus tenderness.     Left Sinus: No maxillary sinus tenderness or frontal sinus tenderness.     Mouth/Throat:     Mouth: Mucous membranes are moist.     Pharynx: Oropharynx is clear.  Cardiovascular:     Rate and Rhythm: Normal rate and regular rhythm.  Pulmonary:     Effort: Pulmonary effort is normal.     Breath sounds: Wheezing present. No rhonchi or rales.  Musculoskeletal:        General: Normal range of motion.  Skin:    General: Skin is warm and dry.  Neurological:     General: No focal deficit present.     Mental Status: She is alert and oriented to person, place, and time. Mental status is at baseline.  Psychiatric:        Mood and Affect: Mood normal.        Behavior: Behavior normal.        Thought Content: Thought content normal.        Judgment: Judgment normal.      Lab Results:  CBC    Component Value Date/Time   WBC 6.0 04/05/2021 1606   RBC 3.84 (L) 04/05/2021 1606   HGB 11.8 (L) 04/05/2021 1606   HCT 34.9 (L) 04/05/2021 1606   PLT 204.0 04/05/2021 1606   MCV 90.9 04/05/2021 1606   MCH 29.2 08/09/2020 1621   MCHC 33.7 04/05/2021 1606   RDW  13.8 04/05/2021 1606   LYMPHSABS 1.8 04/05/2021 1606   MONOABS 0.4 04/05/2021 1606   EOSABS 1.4 (H) 04/05/2021 1606   BASOSABS 0.1 04/05/2021 1606    BMET    Component Value Date/Time   NA 136 04/05/2021 1606   K 4.3 04/05/2021 1606   CL 101 04/05/2021 1606   CO2 30 04/05/2021 1606   GLUCOSE 83 04/05/2021 1606   BUN 15 04/05/2021 1606   CREATININE 1.05 04/05/2021 1606   CREATININE 1.09 (H) 09/09/2020 0938   CALCIUM 9.4 04/05/2021 1606   GFRNONAA 52 (L) 09/09/2020 0938   GFRAA 61 09/09/2020 0938    BNP No results found for: BNP  ProBNP No results found for: PROBNP  Imaging: No results found.   Assessment & Plan:   Asthma with exacerbation - Likely exacerbated by sinusitis symptoms. She had wheezing t/o lung fields on exam. Received depo-medrol 60mg  IM x 1 in office today. Sending in prednisone taper 40mg  x 2 days; 30mg  x 2 days; 20mg  x 2 days; 10mg  x 2 days.   Recommendations:  - Continue Symbicort 66mcg two puffs morning and evening (rinse mouth after use) - Use Albuterol rescue inhaler 2 puffs every 6 hours for shortness of breath/wheezing - Take Mucinex 600mg  tablet twice daily  - Continue Flonase, ocean nasal spray, Zyrtec and Singulair 10mg  at bedtime  - Continue Keflex antibiotic     Martyn Ehrich, NP 04/18/2021

## 2021-04-15 NOTE — Patient Instructions (Addendum)
Nice meeting you MS [place]  Recommendations: - Continue Symbicort 80- take two puffs morning and evening (rinse mouth after use) - Use Albuterol rescue inhaler 2 puffs every 6 hours for shortness of breath/wheezing - Take Mucinex 600mg  tablet twice daily  - Continue Flonase nasal spray  - Continue Singulair 10mg  at bedtime  - Continue Keflex antibiotic   In office treatment: - 60mg  Depomedrol IM   Orders: - Prednisone taper as directed   Follow-up: 3 months with Dr. Melvyn Novas or sooner if needed

## 2021-04-18 NOTE — Assessment & Plan Note (Addendum)
-   Likely exacerbated by sinusitis symptoms. She had wheezing t/o lung fields on exam. Received depo-medrol 60mg  IM x 1 in office today. Sending in prednisone taper 40mg  x 2 days; 30mg  x 2 days; 20mg  x 2 days; 10mg  x 2 days.   Recommendations:  - Continue Symbicort 51mcg two puffs morning and evening (rinse mouth after use) - Use Albuterol rescue inhaler 2 puffs every 6 hours for shortness of breath/wheezing - Take Mucinex 600mg  tablet twice daily  - Continue Flonase, ocean nasal spray, Zyrtec and Singulair 10mg  at bedtime  - Continue Keflex antibiotic

## 2021-04-19 ENCOUNTER — Other Ambulatory Visit: Payer: Self-pay

## 2021-04-19 ENCOUNTER — Encounter: Payer: Self-pay | Admitting: Podiatry

## 2021-04-19 ENCOUNTER — Ambulatory Visit (INDEPENDENT_AMBULATORY_CARE_PROVIDER_SITE_OTHER): Payer: PPO | Admitting: Podiatry

## 2021-04-19 DIAGNOSIS — B351 Tinea unguium: Secondary | ICD-10-CM | POA: Diagnosis not present

## 2021-04-19 DIAGNOSIS — M79671 Pain in right foot: Secondary | ICD-10-CM | POA: Diagnosis not present

## 2021-04-19 DIAGNOSIS — L84 Corns and callosities: Secondary | ICD-10-CM | POA: Diagnosis not present

## 2021-04-19 DIAGNOSIS — M79676 Pain in unspecified toe(s): Secondary | ICD-10-CM

## 2021-04-19 DIAGNOSIS — M79672 Pain in left foot: Secondary | ICD-10-CM

## 2021-04-24 NOTE — Progress Notes (Signed)
  Subjective:  Patient ID: Andrea Mason, female    DOB: 1951/02/18,  MRN: [id number]  Andrea Mason presents to clinic today for callus(es) b/l feet and painful thick toenails that are difficult to trim. Painful toenails interfere with ambulation. Aggravating factors include wearing enclosed shoe gear. Pain is relieved with periodic professional debridement. Painful calluses are aggravated when weightbearing with and without shoegear. Pain is relieved with periodic professional debridement.   She voices no new pedal problems on today's visit.  PCP is Dr. Cathlean Cower and last visit was two weeks ago.  Allergies  Allergen Reactions  . Ivp Dye [Iodinated Diagnostic Agents] Nausea And Vomiting and Other (See Comments)    hot flashes  . Promethazine-Codeine Nausea And Vomiting  . Tramadol Other (See Comments)    Almost passed out    Review of Systems: Negative except as noted in the HPI. Objective:   Constitutional Andrea Mason is a pleasant 70 y.o. African American female, in NAD. AAO x 3.   Vascular Capillary refill time to digits immediate b/l. Palpable pedal pulses b/l LE. Pedal hair present. Lower extremity skin temperature gradient within normal limits. No pain with calf compression b/l. No cyanosis or clubbing noted.  Neurologic Normal speech. Oriented to person, place, and time. Protective sensation intact 5/5 intact bilaterally with 10g monofilament b/l. Vibratory sensation intact b/l.  Dermatologic Pedal skin with normal turgor, texture and tone bilaterally. No open wounds bilaterally. No interdigital macerations bilaterally. Toenails 1-5 b/l elongated, discolored, dystrophic, thickened, crumbly with subungual debris and tenderness to dorsal palpation. Hyperkeratotic lesion(s) L hallux, R hallux, submet head 1 right foot and submet head 5 right foot.  No erythema, no edema, no drainage, no fluctuance.  Orthopedic: Normal muscle strength 5/5 to all lower extremity muscle groups  bilaterally. No pain crepitus or joint limitation noted with ROM b/l. Hallux valgus with bunion deformity noted b/l lower extremities. Hammertoe(s) noted to the b/l lower extremities.   Radiographs: None Assessment:   1. Pain due to onychomycosis of toenail   2. Callus   3. Pain in both feet    Plan:  Patient was evaluated and treated and all questions answered.  Onychomycosis with pain -Nails palliatively debridement as below -Educated on self-care  Procedure: Nail Debridement Rationale: Pain Type of Debridement: manual, sharp debridement. Instrumentation: Nail nipper, rotary burr. Number of Nails: 10 -Examined patient. -No new findings. No new orders. -Patient to continue soft, supportive shoe gear daily. -Toenails 1-5 b/l were debrided in length and girth with sterile nail nippers and dremel without iatrogenic bleeding.  -Callus(es) L hallux, R hallux, submet head 1 right foot and submet head 5 right foot pared utilizing sterile scalpel blade without complication or incident. Total number debrided =4. -Patient to report any pedal injuries to medical professional immediately. -Patient/POA to call should there be question/concern in the interim.  Return in about 3 months (around 07/20/2021).  Marzetta Board, DPM

## 2021-04-26 ENCOUNTER — Telehealth: Payer: Self-pay | Admitting: Internal Medicine

## 2021-04-26 ENCOUNTER — Ambulatory Visit: Payer: PPO

## 2021-04-26 ENCOUNTER — Telehealth: Payer: Self-pay | Admitting: Primary Care

## 2021-04-26 DIAGNOSIS — J328 Other chronic sinusitis: Secondary | ICD-10-CM

## 2021-04-26 NOTE — Telephone Encounter (Signed)
Called and spoke with pt who states she did finish taking the keflex abx that was prescribed by PCP (could not tell me exactly when she finished it) and states she finished prednisone that was prescribed by [facility] yesterday 5/23.  Pt said she is still having a lot of problems with postnasal drainage that is green and states the drainage is just pouring out.  Pt wants to know if there is anything else that we could recommend due to her still having a lot of drainage that is still green in color.  Pt had recent OV with Beth 5/13 and was prescribed prednisone during that visit and also was given a depo injection.  Dr. Melvyn Novas, please advise.

## 2021-04-26 NOTE — Telephone Encounter (Signed)
Refer to ent / Dr [name] saw here 02/17/2019 and probably can see her back quicker than anyone else

## 2021-04-26 NOTE — Telephone Encounter (Signed)
I called and spoke with pt and she is aware that the order to see Dr. [name] has been placed in her chart and they will be contacting her for an appt.

## 2021-04-26 NOTE — Telephone Encounter (Signed)
   Patient calling to report she finished Prednisone (Pulmonary prescribed) She now is suffering from green mucus from nose She has started taking Mucinex  Seeking OTC advice

## 2021-04-26 NOTE — Telephone Encounter (Signed)
Ok to add otc nasal medication such as Nasacort as well, thanks

## 2021-04-26 NOTE — Telephone Encounter (Signed)
pt is calling because she has been taken all of the antibotics that she was given & is still having green mucus. pt states she was originally  blowing her nose or coughing up the phlegm but now it literally dropping out of her nose. pt mentions she may need some  Amoxicillin ( possibly Augmentin?)   because that what Dr. Melvyn Novas usually gives her.  Pt uses [facility] on  [address]  Please advise [phone]

## 2021-04-26 NOTE — Telephone Encounter (Signed)
ATC LVMTC x 1  

## 2021-04-27 NOTE — Telephone Encounter (Signed)
Left patient a voicemail that it is fine t take Nasacort.

## 2021-04-28 ENCOUNTER — Other Ambulatory Visit: Payer: Self-pay | Admitting: Internal Medicine

## 2021-04-28 NOTE — Telephone Encounter (Signed)
Please refill as per office routine med refill policy (all routine meds refilled for 3 mo or monthly per pt preference up to one year from last visit, then month to month grace period for 3 mo, then further med refills will have to be denied)  

## 2021-06-07 ENCOUNTER — Telehealth: Payer: Self-pay | Admitting: Internal Medicine

## 2021-06-07 NOTE — Telephone Encounter (Signed)
1.Medication Requested: fluticasone (FLONASE) 50 MCG/ACT nasal spray   2. Pharmacy (Name, Street, [place]): [place], [place] [address]  3. On Med List: yes   4. Last Visit with PCP: 04-05-21  5. Next visit date with PCP: n/a    Agent: Please be advised that RX refills may take up to 3 business days. We ask that you follow-up with your pharmacy.

## 2021-06-08 NOTE — Telephone Encounter (Signed)
Should be ok to refill - Please refill as per office routine med refill policy (all routine meds refilled for 3 mo or monthly per pt preference up to one year from last visit, then month to month grace period for 3 mo, then further med refills will have to be denied)

## 2021-06-08 NOTE — Telephone Encounter (Signed)
Please advise; does not look as if this was prescribed from you.

## 2021-06-10 ENCOUNTER — Telehealth: Payer: Self-pay | Admitting: Primary Care

## 2021-06-10 ENCOUNTER — Other Ambulatory Visit: Payer: Self-pay

## 2021-06-10 MED ORDER — FLUTICASONE PROPIONATE 50 MCG/ACT NA SUSP: 2.0000 | Freq: Every day | NASAL | 5 refills | Status: DC

## 2021-06-10 MED ORDER — BUDESONIDE-FORMOTEROL FUMARATE 80-4.5 MCG/ACT IN AERO: 2.0000 | INHALATION_SPRAY | Freq: Two times a day (BID) | RESPIRATORY_TRACT | 5 refills | Status: DC

## 2021-06-10 MED ORDER — BUDESONIDE-FORMOTEROL FUMARATE 80-4.5 MCG/ACT IN AERO
2.0000 | INHALATION_SPRAY | Freq: Two times a day (BID) | RESPIRATORY_TRACT | 5 refills | Status: DC
Start: 2021-06-10 — End: 2021-11-21

## 2021-06-10 NOTE — Telephone Encounter (Signed)
   Patient called for status of refill. She is requesting a call back

## 2021-06-10 NOTE — Telephone Encounter (Signed)
Call returned to patient, confirmed DOB. Confirmed medication and pharmacy. Fluticasone sent in. Symbicort script printed for DOD to sign in absence of MW. Patient made aware and voiced understanding.   Nothing further needed at this time.

## 2021-06-10 NOTE — Telephone Encounter (Signed)
Refused medication.

## 2021-06-10 NOTE — Addendum Note (Signed)
Addended by: Biagio Borg on: 06/10/2021 01:57 PM   Modules accepted: Orders

## 2021-06-10 NOTE — Telephone Encounter (Signed)
Patient also needs Fluticasone. Pharmacy is [place]. Patient phone number is [phone].

## 2021-06-10 NOTE — Telephone Encounter (Signed)
Please advise on quantity and refills for patient

## 2021-06-12 ENCOUNTER — Other Ambulatory Visit: Payer: Self-pay | Admitting: Internal Medicine

## 2021-06-12 DIAGNOSIS — J45991 Cough variant asthma: Secondary | ICD-10-CM

## 2021-06-13 ENCOUNTER — Telehealth: Payer: Self-pay | Admitting: Internal Medicine

## 2021-06-13 DIAGNOSIS — I10 Essential (primary) hypertension: Secondary | ICD-10-CM

## 2021-06-13 DIAGNOSIS — J4530 Mild persistent asthma, uncomplicated: Secondary | ICD-10-CM

## 2021-06-13 NOTE — Chronic Care Management (AMB) (Signed)
  Chronic Care Management   Note  06/13/2021 Name: Calisa Luckenbaugh MRN: [id number] DOB: Sep 04, 1951  Anjannette Gauger is a 70 y.o. year old female who is a primary care patient of Jenny Reichmann, Hunt Oris, MD. I reached out to Candyce Churn by phone today in response to a referral sent by Ms. Neoma Laming Formby's PCP, Biagio Borg, MD.   Ms. Caprara was given information about Chronic Care Management services today including:  CCM service includes personalized support from designated clinical staff supervised by her physician, including individualized plan of care and coordination with other care providers 24/7 contact phone numbers for assistance for urgent and routine care needs. Service will only be billed when office clinical staff spend 20 minutes or more in a month to coordinate care. Only one practitioner may furnish and bill the service in a calendar month. The patient may stop CCM services at any time (effective at the end of the month) by phone call to the office staff.   Patient agreed to services and verbal consent obtained.   Follow up plan:   Lauretta Grill [facility] Scheduler

## 2021-06-13 NOTE — Progress Notes (Signed)
  Chronic Care Management   Outreach Note  06/13/2021 Name: Andrea Mason MRN: [id number] DOB: [birth year]  Referred by: Biagio Borg, MD Reason for referral : No chief complaint on file.   An unsuccessful telephone outreach was attempted today. The patient was referred to the pharmacist for assistance with care management and care coordination.   Follow Up Plan:   Lauretta Grill [facility] Scheduler

## 2021-06-17 NOTE — Addendum Note (Signed)
Addended by: Charlton Haws on: 06/17/2021 04:51 PM   Modules accepted: Orders

## 2021-06-30 ENCOUNTER — Telehealth: Payer: Self-pay | Admitting: Internal Medicine

## 2021-06-30 ENCOUNTER — Ambulatory Visit: Payer: PPO | Attending: Critical Care Medicine

## 2021-06-30 DIAGNOSIS — Z20822 Contact with and (suspected) exposure to covid-19: Secondary | ICD-10-CM | POA: Diagnosis not present

## 2021-06-30 NOTE — Telephone Encounter (Signed)
Pt states she doesn't have an appt with Dr [name] until 8/1, but she is sick now. Pt has cough, sore throat, "pain somewhere in body, not heart pain persay." Pt states she has taken Mucinex, Tylenol and throat spray. Not feeling better. Please advise [phone]

## 2021-06-30 NOTE — Telephone Encounter (Signed)
Pt returned phone call did inform her per Emily's reqs and she stated that she would get a Covid test and contact us once she was aware of results. Pls regard; [phone]

## 2021-06-30 NOTE — Telephone Encounter (Signed)
Noted, will await patient call back with results.

## 2021-06-30 NOTE — Telephone Encounter (Signed)
Due to pt's symptoms, pt needs to take a covid test to make sure that is negative.   Attempted to call pt but unable to reach. Left message for her to return call.

## 2021-07-01 ENCOUNTER — Ambulatory Visit: Admission: EM | Admit: 2021-07-01 | Discharge: 2021-07-01 | Discharge status: Against Medical Advice | Payer: PPO

## 2021-07-01 ENCOUNTER — Other Ambulatory Visit: Payer: Self-pay

## 2021-07-01 LAB — SARS-COV-2, NAA 2 DAY TAT

## 2021-07-01 LAB — NOVEL CORONAVIRUS, NAA: SARS-CoV-2, NAA: DETECTED — AB

## 2021-07-01 NOTE — Telephone Encounter (Signed)
Patient states tested positive for Covid 19. Pharmacy is [facility]. Patient phone number is [phone].

## 2021-07-01 NOTE — Telephone Encounter (Signed)
Called and spoke with pt who states her covid test did come back positive which is able to be seen in pt's chart.  Pt said that her symptoms began 2 days ago. States she has had complaints of cough, sore throat, sneezing, body aches.  States that she has not been able to check her temp as her thermometer is messed up but feels like she has been running one as her body has been hot to the touch yet she has been real cold having to have multiple layers on to try to help get her warm.  Pt has been taking mucinex, using a throat spray, tylenol, as well as all her other meds.  Pt has not used her rescue inhaler yet but states if she is up moving around, her breathing does become affected.  Pt wants to know what can be recommended since she has tested positive for Covid. Dr. Melvyn Novas, please advise.

## 2021-07-01 NOTE — Telephone Encounter (Signed)
Called pt to check on her and see if she was able to do a covid test- LMTCB.

## 2021-07-01 NOTE — Telephone Encounter (Signed)
Pt stated that she did the Covid test yesterday and stated she will not know results for 24-48 hours. Pls regard; [phone]

## 2021-07-01 NOTE — Telephone Encounter (Signed)
Called and spoke with patient. She verbalized understanding. She needed help with finding an UC near her. I was able to find a [facility] near her house (UC at [facility]). She is aware of the location.   Nothing further needed at time of call.

## 2021-07-01 NOTE — Telephone Encounter (Signed)
Attempted to call pt but unable to reach. Left message for her to return call. 

## 2021-07-01 NOTE — Telephone Encounter (Signed)
She will need an updated bmet to get treated with paxlovid so best to go to UC or ER  to get this checked and started on paxovid  -  could wait until 7/30 as many pts are feeling better within  a few days even s the drug and this particular is mild - but if getting worse esp if 02 dropping needs to be seen in ER right away

## 2021-07-05 ENCOUNTER — Telehealth: Payer: Self-pay | Admitting: Internal Medicine

## 2021-07-05 ENCOUNTER — Telehealth: Payer: Self-pay

## 2021-07-05 MED ORDER — CLOTRIMAZOLE 10 MG MT TROC: 10.0000 mg | Freq: Four times a day (QID) | OROMUCOSAL | 4 refills | Status: DC

## 2021-07-05 NOTE — Telephone Encounter (Signed)
Clotrimazole troch 10 mg qid # 30  refill x 4

## 2021-07-05 NOTE — Chronic Care Management (AMB) (Signed)
Chronic Care Management Pharmacy Assistant   Name: Andrea Mason  MRN: [id number] DOB: [birth year]  Andrea Mason is an 70 y.o. year old female who presents for his initial CCM visit with the clinical pharmacist.  Recent office visits:  04/05/21 Cathlean Cower MD(PCP)- Patient was seen for annual wellness exam. Labs were ordered and referral for Mammogram was placed. Patient started Clotrimazole. Follow up in 6 months  Recent consult visits:  04/19/21 Acquanetta Sit DPM(Podiatry)-  Patient was seen for pain due to onychomycosis of toe nails. Patient had all 10 toenails debrided and callus were pared. Follow up in 3 months.  04/15/21 Geraldo Pitter NP(Pulm)- Patient was seen for Asthma. Patient started Prednisone 10 mg 4 tabs po BID. Follow up 3 months.   Hospital visits:  Medication Reconciliation was completed by comparing discharge summary, patient's EMR and Pharmacy list, and upon discussion with patient.  Admitted to the hospital on 07/01/21 due to . Discharge date was 07/01/21. Discharged from [facility] [place]?Medications Started at [facility] Discharge:?? -started none  Medication Changes at Hospital Discharge: -Changed none  Medications Discontinued at Hospital Discharge: -Stopped none  Medications that remain the same after Hospital Discharge:??  -All other medications will remain the same.    Medications: Outpatient Encounter Medications as of 07/05/2021  Medication Sig   acetaminophen (TYLENOL) 325 MG tablet Take as directed per bottle instructions   albuterol (VENTOLIN HFA) 108 (90 Base) MCG/ACT inhaler Inhale 1-2 puffs into the lungs every 6 (six) hours as needed for wheezing or shortness of breath.    aspirin 325 MG tablet Take 325 mg by mouth daily.   atorvastatin (LIPITOR) 10 MG tablet TAKE 1 TABLET(10 MG) BY MOUTH DAILY   baclofen (LIORESAL) 10 MG tablet Take 5 mg by mouth every 6 (six) hours as needed for muscle spasms.   Biotin 10000 MCG  TABS Take 10,000 mcg by mouth daily.   budesonide-formoterol (SYMBICORT) 80-4.5 MCG/ACT inhaler Inhale 2 puffs into the lungs 2 (two) times daily.   cetirizine (ZYRTEC) 10 MG tablet Take 10 mg by mouth at bedtime.    Cholecalciferol (VITAMIN D3) 2000 units TABS Take 2,000 Units by mouth daily.   clotrimazole (MYCELEX) 10 MG troche Take 1 tablet (10 mg total) by mouth in the morning, at noon, in the evening, and at bedtime.   darifenacin (ENABLEX) 7.5 MG 24 hr tablet Take 1 tablet (7.5 mg total) by mouth daily.   dextromethorphan-guaiFENesin (MUCINEX DM) 30-600 MG 12hr tablet Take 1-2 tablets by mouth 2 (two) times daily as needed (for congestion/cough (WITH FLUTTER VALVE)).    famotidine (PEPCID) 20 MG tablet Take 1 tablet (20 mg total) by mouth at bedtime.   fluticasone (FLONASE) 50 MCG/ACT nasal spray Place 2 sprays into both nostrils daily.   lidocaine (LIDODERM) 5 % Place 1 patch onto the skin daily. Apply to painful area of chest, remove & discard patch every 12hrs allowing 12hr without patch present (Patient not taking: Reported on 04/15/2021)   Magnesium 250 MG TABS Take 1 tablet by mouth daily.   montelukast (SINGULAIR) 10 MG tablet TAKE 1 TABLET(10 MG) BY MOUTH AT BEDTIME   oxymetazoline (AFRIN) 0.05 % nasal spray Place 2 sprays into both nostrils 2 (two) times daily as needed (nasal stuffiness and nasal pressure for 5 days).    pantoprazole (PROTONIX) 40 MG tablet TAKE 1 TABLET(40 MG) BY MOUTH DAILY   Polyethylene Glycol 3350 (MIRALAX PO) 1 capful daily as needed  Respiratory Therapy Supplies (FLUTTER) DEVI Use as directed   sodium chloride (OCEAN) 0.65 % SOLN nasal spray Place 2 sprays into both nostrils as needed (dry nose).    spironolactone (ALDACTONE) 25 MG tablet Take 25 mg by mouth daily.    No facility-administered encounter medications on file as of 07/05/2021.    Current Medication List acetaminophen (TYLENOL) 325 MG tablet albuterol 108 (90 Base) MCG/ACT inhaler aspirin  325 MG tablet atorvastatin 10 MG tablet last filled 04/28/21 90 DS baclofen 10 MG tablet Biotin 10000 MCG TABS budesonide-formoterol 80-4.5 MCG/ACT last filled 05/09/20 30 DS cetirizine  10 MG tablet Cholecalciferol (VITAMIN D3) 2000 units TABS Clotrimazole 10 MG troche last filled 04/05/21 12 DS darifenacin (ENABLEX) 7.5 MG 24 hr tab last filled 05/14/21 90 DS fluticasone (FLONASE) 50 MCG/ACT nasal spray last filled 06/11/21 30 DS montelukast 10 MG tablet last filled 06/12/21 90 DS oxymetazoline 0.05 % nasal spray last filled pantoprazole 40 MG tablet last filled 04/18/21 90 DS spironolactone 25 MG tablet last filled 06/10/21 30 DS   [place] Pharmacist Assistant [phone]

## 2021-07-05 NOTE — Telephone Encounter (Signed)
Called and spoke with pt who states she has thrush that she cannot get to go away. Pt said that she does rinse her mouth out after she uses the Symbicort.  Pt wants to know if there is anything that can be prescribed.  Dr. Melvyn Novas, please advise.

## 2021-07-05 NOTE — Telephone Encounter (Signed)
Called and spoke with pt letting her know recs stated by MW and she verbalized understanding. Verified preferred pharmacy and sent Rx in for pt. Nothing further needed.

## 2021-07-12 ENCOUNTER — Other Ambulatory Visit: Payer: Self-pay | Admitting: Internal Medicine

## 2021-07-19 ENCOUNTER — Ambulatory Visit: Payer: PPO | Admitting: Podiatry

## 2021-07-19 ENCOUNTER — Ambulatory Visit: Payer: PPO | Admitting: Internal Medicine

## 2021-07-19 ENCOUNTER — Other Ambulatory Visit: Payer: Self-pay

## 2021-07-20 ENCOUNTER — Telehealth: Payer: Self-pay | Admitting: Internal Medicine

## 2021-07-20 NOTE — Telephone Encounter (Signed)
ATC patient unable to reach  We no longer keep samples of symbicort

## 2021-07-20 NOTE — Telephone Encounter (Signed)
Pt stated that she is wanting to know if we have any samples of Symbicort and also she had questions in regards to faxing a prescription for her Symbicort to [facility].   Pls regard; [phone]

## 2021-07-26 ENCOUNTER — Encounter: Payer: Self-pay | Admitting: Internal Medicine

## 2021-07-26 ENCOUNTER — Ambulatory Visit: Payer: PPO

## 2021-07-26 ENCOUNTER — Other Ambulatory Visit: Payer: Self-pay

## 2021-07-26 ENCOUNTER — Ambulatory Visit (INDEPENDENT_AMBULATORY_CARE_PROVIDER_SITE_OTHER): Payer: PPO | Admitting: Internal Medicine

## 2021-07-26 DIAGNOSIS — J45991 Cough variant asthma: Secondary | ICD-10-CM | POA: Diagnosis not present

## 2021-07-26 DIAGNOSIS — J328 Other chronic sinusitis: Secondary | ICD-10-CM

## 2021-07-26 MED ORDER — AMOXICILLIN-POT CLAVULANATE 875-125 MG PO TABS: 1.0000 | ORAL_TABLET | Freq: Two times a day (BID) | ORAL | 11 refills | Status: AC

## 2021-07-26 NOTE — Progress Notes (Signed)
Subjective:   Patient ID: Andrea Mason, female    DOB: Jun 25, 1951   MRN: [id number]  Brief patient profile:  18  yobf never smoker followed by Dr Loretha Brasil for allergic rhinitis and asthma     History of Present Illness  02/05/2013 1st pulmonary eval in EPIC era baseline = completely better on qvar 80 2bid and only use rescue once a week at baseline then much worse starting 02/03/13 with cough/ congestion/ green mucus and rescue x one by time of ov at 2pm.  No resting sob, very hoarse with harsh barking cough >Augmentin rx      12/18/2017  f/u ov/Mirtha Jain re:  Asthma/ chronic rhinitis / doing better following med calendar  Chief Complaint  Patient presents with   Follow-up    slight cough, red colored mucus when blowing her nose, feels like z pac and prednisone helped after her last visit   very rare saba need  Not limited by breathing from desired activities   Sleeping fine  rec 3 month f/u [name] P re med calendar/ reconciliation > did not do    06/13/2018 acute extended ov/Kendrix Orman re: cough x one week > dark mucus with nasal congestion/ acute onset  Chief Complaint  Patient presents with   Acute Visit    coughing up black mucus,, sinus infection, nasal congestion   breathing is ok  Has calendar but not following contingency instructions re rx for cough with nasty sputum, has augmentin refillable thru 12/2018 rec If night time cough or reflux best medication = Pepcid 20 mg otc or get Dr Jenny Reichmann to write it.   I would take the vaccine when available.    10/15/2020  f/u ov/Jabori Henegar re: asthma/ chronic rhinitis  using med calendar well/ maint on symb 62 2bid  Chief Complaint  Patient presents with   Follow-up    pt states having clear mucus in mouth   Dyspnea: planning going to gym / walking neighborhood x one hour  With hills x 1 h but not lately Cough: spoardic / daytime/ assoc pnds s purulent sputum Sleeping: bed is flat/ sev pillows / no resp complaints  SABA use: none  02: none   Rec Follow med cal   07/26/2021  f/u ov/Danta Baumgardner re: rhinitis/ asthma maint on dulera 200 when ran out of symbicort   Chief Complaint  Patient presents with   Follow-up    Patient  reports that she is doing ok.     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   Sleeping  without nocturnal  or early am exacerbation  of respiratory  c/o's or need for noct saba. Also denies any obvious fluctuation of symptoms with weather or environmental changes or other aggravating or alleviating factors except as outlined above   No unusual exposure hx or h/o childhood pna/ asthma or knowledge of premature birth.  Current Allergies, Complete Past Medical History, Past Surgical History, Family History, and Social History were reviewed in [employer] record.  ROS  The following are not active complaints unless bolded Hoarseness,  sore throat, dysphagia, dental problems, itching, sneezing,  nasal congestion or discharge of excess mucus or purulent secretions, ear ache,   fever, chills, sweats, unintended wt loss or wt gain, classically pleuritic or exertional cp,  orthopnea pnd or arm/hand swelling  or leg swelling, presyncope, palpitations, abdominal pain, anorexia, nausea, vomiting, diarrhea  or change in bowel habits or change in bladder habits, change in stools or change in urine, dysuria, hematuria,  rash, arthralgias, visual complaints, headache, numbness, weakness or ataxia or problems with walking or coordination,  change in mood or  memory.        Current Meds  Medication Sig   acetaminophen  (TYLENOL) 325 MG tablet Take as directed per bottle instructions   albuterol (VENTOLIN HFA) 108 (90 Base) MCG/ACT inhaler Inhale 1-2 puffs into the lungs every 6 (six) hours as needed for wheezing or shortness of breath.    aspirin 325 MG tablet Take 325 mg by mouth daily.   atorvastatin (LIPITOR) 10 MG tablet TAKE 1 TABLET(10 MG) BY MOUTH DAILY   baclofen (LIORESAL) 10 MG tablet Take 5 mg by mouth every 6 (six) hours as needed for muscle spasms.   Biotin 10000 MCG TABS Take 10,000 mcg by mouth daily.   budesonide-formoterol (SYMBICORT) 80-4.5 MCG/ACT inhaler Inhale 2 puffs into the lungs 2 (two) times daily.   cetirizine (ZYRTEC) 10 MG tablet Take 10 mg by mouth at bedtime.    Cholecalciferol (VITAMIN D3) 2000 units TABS Take 2,000 Units by mouth daily.   clotrimazole (MYCELEX) 10 MG troche Take 1 tablet (10 mg total) by mouth in the morning, at noon, in the evening, and at bedtime.   darifenacin (ENABLEX) 7.5 MG 24 hr tablet Take 1 tablet (7.5 mg total) by mouth daily.   dextromethorphan-guaiFENesin (MUCINEX DM) 30-600 MG 12hr tablet Take 1-2 tablets by mouth 2 (two) times daily as needed (for congestion/cough (WITH FLUTTER VALVE)).    famotidine (PEPCID) 20 MG tablet Take 1 tablet (20 mg total) by mouth at bedtime.   fluticasone (FLONASE) 50 MCG/ACT nasal spray Place 2 sprays into both nostrils daily.   lidocaine (LIDODERM) 5 % Place 1 patch onto the skin daily. Apply to painful area of chest, remove & discard patch every 12hrs allowing 12hr without patch present   Magnesium 250 MG TABS Take 1 tablet by mouth daily.   montelukast (SINGULAIR) 10 MG tablet TAKE 1 TABLET(10 MG) BY MOUTH AT BEDTIME   oxymetazoline (AFRIN) 0.05 % nasal spray Place 2 sprays into both nostrils 2 (two) times daily as needed (nasal stuffiness and nasal pressure for 5 days).    pantoprazole (PROTONIX) 40 MG tablet TAKE 1 TABLET(40 MG) BY MOUTH DAILY   Polyethylene Glycol 3350 (MIRALAX PO) 1 capful daily as needed    Respiratory Therapy Supplies (FLUTTER) DEVI Use as directed   sodium chloride (OCEAN) 0.65 % SOLN nasal spray Place 2 sprays into both nostrils as needed (dry nose).    spironolactone (ALDACTONE) 25 MG tablet Take 25 mg by mouth daily.                               Objective:      07/26/2021          182   10/15/2020       191  06/13/2018         176  12/18/2017          170   07/11/16 159  lb 3.2 oz (72.2 kg)  07/11/16 160 lb (72.6 kg)  04/05/16 163 lb (73.9 kg)     Vital signs reviewed  07/26/2021  - Note at rest 02 sats  98% on RA   General appearance:    amb bf nad   HEENT : pt wearing mask not removed for exam due to covid -19 concerns.    NECK :  without JVD/Nodes/TM/ nl carotid upstrokes bilaterally   LUNGS: no acc muscle use,  Nl contour chest which is clear to A and P bilaterally without cough on insp or exp maneuvers   CV:  RRR  no s3 or murmur or increase in P2, and no edema   ABD:  soft and nontender with nl inspiratory excursion in the supine position. No bruits or organomegaly appreciated, bowel sounds nl  MS:  Nl gait/ ext warm without deformities, calf tenderness, cyanosis or clubbing No obvious joint restrictions   SKIN: warm and dry without lesions    NEURO:  alert, approp, nl sensorium with  no motor or cerebellar deficits apparent.                 Assessment & Plan:

## 2021-07-26 NOTE — Progress Notes (Deleted)
Chronic Care Management Pharmacy Note  07/26/2021 Name:  Andrea Mason MRN:  [id number] DOB:  11-23-1951  Summary: ***  Recommendations/Changes made from today's visit: ***  Plan: ***  Subjective: Andrea Mason is an 70 y.o. year old female who is a primary patient of Jenny Reichmann, Hunt Oris, MD.  The CCM team was consulted for assistance with disease management and care coordination needs.    Engaged with patient face to face for initial visit in response to provider referral for pharmacy case management and/or care coordination services.   Consent to Services:  The patient was given the following information about Chronic Care Management services today, agreed to services, and gave verbal consent: 1. CCM service includes personalized support from designated clinical staff supervised by the primary care provider, including individualized plan of care and coordination with other care providers 2. 24/7 contact phone numbers for assistance for urgent and routine care needs. 3. Service will only be billed when office clinical staff spend 20 minutes or more in a month to coordinate care. 4. Only one practitioner may furnish and bill the service in a calendar month. 5.The patient may stop CCM services at any time (effective at the end of the month) by phone call to the office staff. 6. The patient will be responsible for cost sharing (co-pay) of up to 20% of the service fee (after annual deductible is met). Patient agreed to services and consent obtained.  Patient Care Team: Biagio Borg, MD as PCP - General Garvin Fila, MD as Consulting Physician (Neurology) Delice Bison Darnelle Maffucci, [facility] as Pharmacist (Pharmacist)  Recent office visits:  04/05/21 Cathlean Cower MD(PCP)- Patient was seen for annual wellness exam. Labs were ordered and referral for Mammogram was placed. Patient started Clotrimazole troche. Follow up in 6 months   Recent consult visits:  04/19/21 Acquanetta Sit DPM(Podiatry)-  Patient was  seen for pain due to onychomycosis of toe nails. Patient had all 10 toenails debrided and callus were pared. Follow up in 3 months.   04/15/21 Geraldo Pitter NP(Pulm)- Patient was seen for Asthma. Patient started Prednisone 10 mg 4 tabs po BID. Follow up 3 months.     Hospital visits:  Medication Reconciliation was completed by comparing discharge summary, patient's EMR and Pharmacy list, and upon discussion with patient.   Admitted to the hospital on 07/01/21 due to . Discharge date was 07/01/21. Discharged from [facility] [place]?Medications Started at [facility] Discharge:?? -started none   Medication Changes at Hospital Discharge: -Changed none   Medications Discontinued at Hospital Discharge: -Stopped none   Medications that remain the same after Hospital Discharge:??  -All other medications will remain the same.    Objective:  Lab Results  Component Value Date   CREATININE 1.05 04/05/2021   BUN 15 04/05/2021   GFR 54.70 (L) 04/05/2021   GFRNONAA 52 (L) 09/09/2020   GFRAA 61 09/09/2020   NA 136 04/05/2021   K 4.3 04/05/2021   CALCIUM 9.4 04/05/2021   CO2 30 04/05/2021   GLUCOSE 83 04/05/2021    Lab Results  Component Value Date/Time   HGBA1C 6.0 04/05/2021 04:06 PM   HGBA1C 6.3 09/22/2020 09:53 AM   GFR 54.70 (L) 04/05/2021 04:06 PM   GFR 68.62 06/03/2019 04:14 PM    Last diabetic Eye exam:  No results found for: HMDIABEYEEXA  Last diabetic Foot exam:  No results found for: HMDIABFOOTEX   Lab Results  Component Value Date   CHOL 82  04/05/2021   HDL 49.60 04/05/2021   LDLCALC 16 04/05/2021   TRIG 82.0 04/05/2021   CHOLHDL 2 04/05/2021    Hepatic Function Latest Ref Rng & Units 04/05/2021 09/09/2020 12/26/2019  Total Protein 6.0 - 8.3 g/dL 7.5 7.2 7.1  Albumin 3.5 - 5.2 g/dL 4.0 - 3.7  AST 0 - 37 U/L '19 17 18  ' ALT 0 - 35 U/L '15 11 18  ' Alk Phosphatase 39 - 117 U/L 107 - 89  Total Bilirubin 0.2 - 1.2 mg/dL 0.7 0.8 0.8  Bilirubin,  Direct 0.0 - 0.3 mg/dL 0.2 - -    Lab Results  Component Value Date/Time   TSH 1.54 04/05/2021 04:06 PM   TSH 1.05 06/03/2019 04:14 PM    CBC Latest Ref Rng & Units 04/05/2021 08/09/2020 12/26/2019  WBC 4.0 - 10.5 K/uL 6.0 5.4 -  Hemoglobin 12.0 - 15.0 g/dL 11.8(L) 11.4(L) 13.3  Hematocrit 36.0 - 46.0 % 34.9(L) 35.4(L) 39.0  Platelets 150.0 - 400.0 K/uL 204.0 194 -    Lab Results  Component Value Date/Time   VD25OH 38.05 04/05/2021 04:06 PM   VD25OH 41.39 06/03/2019 04:14 PM    Clinical ASCVD: Yes  The ASCVD Risk score Mikey Bussing DC Jr., et al., 2013) failed to calculate for the following reasons:   The patient has a prior MI or stroke diagnosis    Depression screen [facility] 2/9 04/05/2021 04/05/2021 09/09/2020  Decreased Interest 0 0 0  Down, Depressed, Hopeless 0 0 0  PHQ - 2 Score 0 0 0  Some recent data might be hidden     Social History   Tobacco Use  Smoking Status Never  Smokeless Tobacco Never   BP Readings from Last 3 Encounters:  04/15/21 112/74  04/05/21 122/72  10/15/20 120/72   Pulse Readings from Last 3 Encounters:  04/15/21 79  04/05/21 71  10/15/20 72   Wt Readings from Last 3 Encounters:  04/15/21 186 lb (84.4 kg)  04/05/21 186 lb 9.6 oz (84.6 kg)  10/15/20 191 lb 9.6 oz (86.9 kg)   BMI Readings from Last 3 Encounters:  04/15/21 34.02 kg/m  04/05/21 34.13 kg/m  10/15/20 35.04 kg/m    Assessment/Interventions: Review of patient past medical history, allergies, medications, health status, including review of consultants reports, laboratory and other test data, was performed as part of comprehensive evaluation and provision of chronic care management services.   SDOH:  (Social Determinants of Health) assessments and interventions performed: {yes/no:20286}  SDOH Screenings   Alcohol Screen: Not on file  Depression (PHQ2-9): Low Risk    PHQ-2 Score: 0  Financial Resource Strain: Not on file  Food Insecurity: Not on file  Housing: Not on file   Physical Activity: Not on file  Social Connections: Not on file  Stress: Not on file  Tobacco Use: Low Risk    Smoking Tobacco Use: Never   Smokeless Tobacco Use: Never  Transportation Needs: Not on file    [place]  Allergies  Allergen Reactions   Ivp Dye [Iodinated Diagnostic Agents] Nausea And Vomiting and Other (See Comments)    hot flashes   Promethazine-Codeine Nausea And Vomiting   Tramadol Other (See Comments)    Almost passed out    Medications Reviewed Today     Reviewed by Marzetta Board, DPM (Physician) on 04/19/21 at [place] List Status: <None>   Medication Order Taking? Sig Documenting Provider Last Dose Status Informant  acetaminophen (TYLENOL) 325 MG tablet [id number] No Take as directed  per bottle instructions [provider] Taking Active   albuterol (VENTOLIN HFA) 108 (90 Base) MCG/ACT inhaler [id number] No Inhale 1-2 puffs into the lungs every 6 (six) hours as needed for wheezing or shortness of breath.  [provider] Taking Active   aspirin 325 MG tablet [id number] No Take 325 mg by mouth daily. [provider] Taking Active Self  atorvastatin (LIPITOR) 10 MG tablet [id number] No TAKE 1 TABLET(10 MG) BY MOUTH DAILY Biagio Borg, MD Taking Active   baclofen (LIORESAL) 10 MG tablet [id number] No Take 5 mg by mouth every 6 (six) hours as needed for muscle spasms. [provider] Taking Active   Biotin 10000 MCG TABS [id number] No Take 10,000 mcg by mouth daily. [provider] Taking Active Self  budesonide-formoterol (SYMBICORT) 80-4.5 MCG/ACT inhaler [id number] No Inhale 2 puffs into the lungs 2 (two) times daily. Tanda Rockers, MD Taking Active   cetirizine (ZYRTEC) 10 MG tablet [id number] No Take 10 mg by mouth at bedtime.  [provider] Taking Active Self  Cholecalciferol (VITAMIN D3) 2000 units TABS [id number] No Take 2,000 Units by mouth daily. [provider] Taking Active Self   clotrimazole (MYCELEX) 10 MG troche [id number] No Take 1 tablet (10 mg total) by mouth 5 (five) times daily. Biagio Borg, MD Taking Active   darifenacin (ENABLEX) 7.5 MG 24 hr tablet [id number] No Take 1 tablet (7.5 mg total) by mouth daily. Biagio Borg, MD Taking Active   dextromethorphan-guaiFENesin [facility] DM) 30-600 MG 12hr tablet [id number] No Take 1-2 tablets by mouth 2 (two) times daily as needed (for congestion/cough (WITH FLUTTER VALVE)).  [provider] Taking Active Self  famotidine (PEPCID) 20 MG tablet [id number] No Take 1 tablet (20 mg total) by mouth at bedtime. Biagio Borg, MD Taking Active   fluticasone [facility]) 50 MCG/ACT nasal spray [id number] No Place into both nostrils daily. [provider] Taking Active   lidocaine (LIDODERM) 5 % [id number] No Place 1 patch onto the skin daily. Apply to painful area of chest, remove & discard patch every 12hrs allowing 12hr without patch present  Patient not taking: Reported on 04/15/2021   Renold Genta, MD Not Taking Active   Magnesium 250 MG TABS [id number] No Take 1 tablet by mouth daily. [provider] Taking Active   montelukast (SINGULAIR) 10 MG tablet [id number] No TAKE 1 TABLET(10 MG) BY MOUTH AT BEDTIME Tanda Rockers, MD Taking Active   oxymetazoline (AFRIN) 0.05 % nasal spray [id number] No Place 2 sprays into both nostrils 2 (two) times daily as needed (nasal stuffiness and nasal pressure for 5 days).  [provider] Taking Active Self  pantoprazole (PROTONIX) 40 MG tablet [id number] No TAKE 1 TABLET(40 MG) BY MOUTH DAILY Biagio Borg, MD Taking Active   Polyethylene Glycol 3350 (MIRALAX PO) [id number] No 1 capful daily as needed [provider] Taking Active   predniSONE (DELTASONE) 10 MG tablet [id number]  Take 4 tabs po daily x 2 days; then 3 tabs for 2 days; then 2 tabs for 2 days; then 1 tab for 2 days Martyn Ehrich, NP  Active   Respiratory Therapy Supplies (FLUTTER) DEVI  [id number] No Use as directed [provider] Taking Active   sodium chloride (OCEAN) 0.65 % SOLN nasal spray [id number] No Place 2 sprays into both nostrils as needed (dry nose).  [provider] Taking Active Self  spironolactone (ALDACTONE) 25 MG tablet [id number] No Take 25 mg by mouth daily.  [provider] Taking Active Self           Med Note Corky Mull   Tue Aug 28, 2017  1:42 PM)              Patient Active Problem List   Diagnosis Date Noted   Thrush 04/12/2021   Urinary frequency 09/22/2020   Wrist pain, acute, left 09/09/2020   CKD (chronic kidney disease) stage 3, GFR 30-59 ml/min (HCC) 12/31/2019   Painful legs and moving toes of right foot 12/16/2019   Skin nodule 11/16/2019   Nocturia 11/16/2019   Low back pain 11/16/2019   Alopecia 01/31/2019   Upper airway cough syndrome 06/14/2018   Rash 05/30/2018   Vitamin D deficiency 08/31/2017   Facial contusion, subsequent encounter 03/12/2017   Vertigo 03/12/2017   Right rotator cuff tear 07/27/2016   Right shoulder pain 07/11/2016   Overactive bladder 01/19/2016   Cough variant asthma 09/07/2015   Burn 09/06/2015   Vocal cord dysfunction 04/05/2015   Asthma with exacerbation 04/05/2015   Left knee pain 03/12/2015   Right knee pain 03/12/2015   Cough 12/08/2014   Skin lumps, generalized 09/08/2014   Toe pain, right 08/06/2014   Pre-ulcerative corn or callous 08/06/2014   Hammertoe 08/06/2014   Swelling of joint, ankle, right 08/06/2014   Pansinusitis 06/02/2014   Cerebral thrombosis with cerebral infarction ([place]) 05/26/2014   Weakness 05/25/2014   Right arm pain 05/25/2014   Numbness in right leg 05/25/2014   Numbness and tingling of right arm 42/35/3614   Embolic stroke ([place]) 43/15/4008   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   Alterations of sensations, late effect of cerebrovascular disease(438.6) 02/10/2014   Cerebral infarction ([place]) 11/25/2013    Hyperlipidemia 11/24/2013   Syncope 11/20/2013   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   Back pain 11/06/2013   Hypokalemia 05/06/2013   Family history of colon cancer 05/06/2013   Lipoma 09/03/2012   Hyperglycemia 08/15/2012   Constipation 01/02/2012   Preventative health care 05/24/2011   OSTEOARTHRITIS, CERVICAL SPINE 02/09/2011   HYPERSOMNIA WITH SLEEP APNEA UNSPECIFIED 01/26/2011   UTI (urinary tract infection) 11/15/2010   NASAL POLYP 07/28/2010   FIBROIDS, UTERUS 07/27/2010   COMPUTERIZED TOMOGRAPHY, CHEST, ABNORMAL 09/16/2009   Seasonal and perennial allergic rhinitis 05/29/2009   PERIPHERAL EDEMA 05/28/2009   Mild persistent chronic asthma without complication 67/61/9509   LEG PAIN, LEFT 06/30/2008   COLONIC POLYPS, HX OF 06/30/2008   OBESITY 12/12/2007   Depression 12/12/2007   Essential hypertension 12/12/2007   RHINOSINUSITIS, CHRONIC 12/12/2007   GERD 12/12/2007   ARTHRITIS 12/12/2007    Immunization History  Administered Date(s) Administered   Influenza Split 09/03/2012   Influenza Whole 09/27/2006, 08/23/2007, 11/16/2009, 08/25/2010   Influenza,inj,Quad PF,6+ Mos 08/21/2013, 08/06/2014, 10/05/2015   PFIZER(Purple Top)SARS-COV-2 Vaccination 03/04/2020, 03/29/2020   Pneumococcal Conjugate-13 12/02/2014   Pneumococcal Polysaccharide-23 06/03/2019   Tdap 05/06/2013, 08/25/2015   Zoster, Live 05/06/2013    Conditions to be addressed/monitored:  {USCCMDZASSESSMENTOPTIONS:23563}  There are no care plans that you recently modified to display for this patient.     Medication Assistance: {MEDASSISTANCEINFO:25044}  Compliance/Adherence/Medication fill history: Care Gaps: ***  Patient's preferred pharmacy is:  [facility] [place] [address] Phone: [phone] Fax: [place], [place] [address] Phone: [phone] Fax: [phone]  Uses pill box? {Yes or If no, why not?:20788} Pt endorses ***% compliance  Care Plan and Follow Up Patient Decision:  {FOLLOWUP:24991}  Plan: {CM FOLLOW UP UUEK:80034}  ***  Current Barriers:  {pharmacybarriers:24917}  Pharmacist Clinical Goal(s):  Patient will {PHARMACYGOALCHOICES:24921} through collaboration with PharmD and provider.   Interventions: 1:1 collaboration with Biagio Borg, MD regarding development and update of comprehensive plan of care as evidenced by provider attestation and co-signature Inter-disciplinary care team collaboration (see longitudinal plan of care) Comprehensive medication review performed; medication list updated in electronic medical record  Hypertension (BP goal <130/80) -{US controlled/uncontrolled:25276} -Current treatment: Spironolactone 33m - 1 tablet daily  -Medications previously tried: amlodipine, hctz, diltiazem,   -Current home readings: *** -Current dietary habits: *** -Current exercise habits: *** -{ACTIONS;DENIES/REPORTS:21021675::"Denies"} hypotensive/hypertensive symptoms -Educated on {CCM BP Counseling:25124} -Counseled to monitor BP at home ***, document, and provide log at future appointments -{CCMPHARMDINTERVENTION:25122}  Hyperlipidemia / Previous Stroke: (LDL goal < 70) -{US controlled/uncontrolled:25276} Lab Results  Component Value Date   LDLCALC 16 04/05/2021  -Current treatment: Atorvastatin 183m- 1 tablet daily  Aspirin 32538m 1 tablet daily  -Medications previously tried: rosuvastatin, pitavastatin, pravastatin,   -Current dietary patterns: *** -Current exercise habits: *** -Educated on {CCM HLD Counseling:25126} -{CCMPHARMDINTERVENTION:25122}  Asthma / Allergic Rhinitis (Goal: control symptoms and prevent exacerbations) -{US controlled/uncontrolled:25276} -Current treatment  Albuterol 108m56mct - 1-2 puffs every 6  hours as needed Symbicort 80-4.5mcg3mt - 2 puffs twice daily  Montelukast 10mg 69mtablet daily  Fluticasone 50mcg/47mnasal spray - 2 sprays into each nostril daily  Cetirizine 10mg - 66mblet daily  Sodium Chloride 0.65% nasal spray - 2 sprays into both nostrils as needed  Oxymetazoline 0.05% nasal spray - 2 sprays into both nostrils twice daily  Dextromethorphan-guaifenesin 30-600mg - 148mablet twice daily  -Medications previously tried: beclomethasone, alvesco, breo, dulera, zafirlukast  -Gold Grade: {CHL HP [facility] Pharm COPD Gold Grade:210JZPHX:[id number]} COPD Classification:  {CHL HP Upstream Pharm COPD [phone]} -MMRC/CAT score: *** -Pulmonary function testing: *** -Exacerbations requiring treatment in last 6 months: *** -Patient {Actions; denies-reports:120008} consistent use of maintenance inhaler -Frequency of rescue inhaler use: *** -Counseled on {CCMINHALERCOUNSELING:25121} -{CCMPHARMDINTERVENTION:25122}  GERD (Goal: prevention / control of acid reflux) -{US controlled/uncontrolled:25276} -Current treatment  Famotidine 20mg - 1 86met daily  Pantoprazole 40mg - 1 t16mt daily  -Medications previously tried: dexilant, esomeprazole, omeprazole, ranitidine,   -{CCMPHARMDINTERVENTION:25122}  *** (Goal: ***) -{US controlled/uncontrolled:25276} -Current treatment  *** -Medications previously tried: ***  -{CCMPHARMDINTERVENTION:25122}  OAB (Goal: Promotion of appropriate urinary frequency) -{US controlled/uncontrolled:25276} -Current treatment  Darifenacin (Enablex) 7.5mg - 1 tab45m daily  -Medications previously tried: oxybutynin, toviaz  -{CCMPHARMDINTERVENTION:25122}  Chronic Pain / OA (Goal: Pain control) -{US controlled/uncontrolled:25276} -Current treatment  Lidocaine 5% patch - 1 patch daily  Acetaminophen 325mg - *** -19mcations previously tried: naproxen, tramadol,   -{CCMPHARMDINTERVENTION:25122}   Health Maintenance -Vaccine  gaps: *** -Current therapy:  Magnesium 250mg - 1 tabl35maily  Biotin 10,000mcg - 1 tabl58maily  Vitamin D3 2,000 units - 1 tablet daily  Miralax - 17g daily as needed  -Educated on {ccm supplement counseling:25128} -{CCM Patient satisfied:25129} -{CCMPHARMDINTERVENTION:25122}  Patient Goals/Self-Care Activities Patient will:  - {pharmacypatientgoals:24919}  Follow Up Plan: {CM FOLLOW UP PLAN:22241}  CuXKPV:37482} prep = 19 minutes

## 2021-07-26 NOTE — Assessment & Plan Note (Signed)
CT 2012-chronic sinus disease - Referred back to Dr [name] 01/07/16 > could not see due to $ as has no insurance - 07/11/2016 placed on augmentin x 10 d cylces as part of her action plan and refilled 12/2017 x one year and again 07/26/2021   rec f/u with shoemaker regularly at his direction and here prn for this problem         Each maintenance medication was reviewed in detail including most importantly the difference between maintenance and as needed and under what circumstances the prns are to be used. This was done in the context of a medication calendar review which provided the patient with a user-friendly unambiguous mechanism for medication administration and reconciliation and provides an action plan for all active problems. It is critical that this be shown to every doctor  for modification during the office visit if necessary so the patient can use it as a working document.         Total time for H and P, chart review, counseling, reviewing hfa  device(s) and generating customized AVS unique to this office visit / same day charting = 23 min

## 2021-07-26 NOTE — Assessment & Plan Note (Signed)
-  Spirometry 12/11/2014  Min abn mid flows only   -  10/15/2020  After extensive coaching inhaler device,  effectiveness =    90%   All goals of chronic asthma control met including optimal function and elimination of symptoms with minimal need for rescue therapy.  Contingencies discussed in full including contacting this office immediately if not controlling the symptoms using the rule of two's.

## 2021-07-26 NOTE — Patient Instructions (Signed)
If mucus gets nasty > Augmentin 875 mg take one pill twice daily  X 10 days - take at breakfast and supper with large glass of water.  It would help reduce the usual side effects (diarrhea and yeast infections) if you ate cultured yogurt at lunch.   See Dr [name]   See calendar for specific medication instructions and bring it back for each and every office visit for every healthcare provider you see.  Without it,  you may not receive the best quality medical care that we feel you deserve.  You will note that the calendar groups together  your maintenance  medications that are timed at particular times of the day.  Think of this as your checklist for what your doctor has instructed you to do until your next evaluation to see what benefit  there is  to staying on a consistent group of medications intended to keep you well.  The other group at the bottom is entirely up to you to use as you see fit  for specific symptoms that may arise between visits that require you to treat them on an as needed basis.  Think of this as your action plan or "what if" list.   Separating the top medications from the bottom group is fundamental to providing you adequate care going forward.     Please schedule a follow up visit in 6 months but call sooner if needed

## 2021-08-01 DIAGNOSIS — J310 Chronic rhinitis: Secondary | ICD-10-CM | POA: Diagnosis not present

## 2021-08-01 DIAGNOSIS — J324 Chronic pansinusitis: Secondary | ICD-10-CM | POA: Diagnosis not present

## 2021-08-04 ENCOUNTER — Telehealth: Payer: Self-pay | Admitting: Lab

## 2021-08-04 NOTE — Chronic Care Management (AMB) (Signed)
  Chronic Care Management   Outreach Note  08/04/2021 Name: Andrea Mason MRN: [id number] DOB: 24-Aug-1951  Referred by: Biagio Borg, MD Reason for referral : Medication Management   An unsuccessful telephone outreach was attempted today. The patient was referred to the pharmacist for assistance with care management and care coordination.   Follow Up Plan:   [place]

## 2021-08-30 DIAGNOSIS — I10 Essential (primary) hypertension: Secondary | ICD-10-CM | POA: Diagnosis not present

## 2021-08-30 DIAGNOSIS — E269 Hyperaldosteronism, unspecified: Secondary | ICD-10-CM | POA: Diagnosis not present

## 2021-09-21 ENCOUNTER — Telehealth: Payer: Self-pay | Admitting: Internal Medicine

## 2021-09-21 NOTE — Telephone Encounter (Signed)
Pt. Has called and is requesting a prescription be sent to pharmacy for her spironolactone (ALDACTONE) 25 MG tablet.     [facility] [place], [place] Phone:  [phone]  Fax:  [phone]     Callback #- [phone]

## 2021-09-22 MED ORDER — SPIRONOLACTONE 25 MG PO TABS: 25.0000 mg | ORAL_TABLET | Freq: Every day | ORAL | 1 refills | Status: DC

## 2021-10-04 DIAGNOSIS — J339 Nasal polyp, unspecified: Secondary | ICD-10-CM | POA: Diagnosis not present

## 2021-10-04 DIAGNOSIS — J324 Chronic pansinusitis: Secondary | ICD-10-CM | POA: Diagnosis not present

## 2021-10-04 DIAGNOSIS — J329 Chronic sinusitis, unspecified: Secondary | ICD-10-CM | POA: Diagnosis not present

## 2021-11-21 ENCOUNTER — Telehealth: Payer: Self-pay | Admitting: Internal Medicine

## 2021-11-21 MED ORDER — BUDESONIDE-FORMOTEROL FUMARATE 80-4.5 MCG/ACT IN AERO: 2.0000 | INHALATION_SPRAY | Freq: Two times a day (BID) | RESPIRATORY_TRACT | 5 refills | Status: DC

## 2021-11-21 MED ORDER — BUDESONIDE-FORMOTEROL FUMARATE 80-4.5 MCG/ACT IN AERO: 2.0000 | INHALATION_SPRAY | Freq: Two times a day (BID) | RESPIRATORY_TRACT | 0 refills | Status: DC

## 2021-11-21 NOTE — Telephone Encounter (Signed)
I have called and LM on VM for the pt 

## 2021-11-21 NOTE — Telephone Encounter (Signed)
I have sent in prescription to the [facility] of choice to use the Goodrx per the patient. She knows how to use the information. She is going to check on what is going with her [place] and [place].

## 2021-11-23 ENCOUNTER — Ambulatory Visit: Payer: PPO | Admitting: Primary Care

## 2021-12-21 ENCOUNTER — Other Ambulatory Visit: Payer: Self-pay | Admitting: Internal Medicine

## 2021-12-21 MED ORDER — BUDESONIDE-FORMOTEROL FUMARATE 80-4.5 MCG/ACT IN AERO: 2.0000 | INHALATION_SPRAY | Freq: Two times a day (BID) | RESPIRATORY_TRACT | 5 refills | Status: DC

## 2021-12-21 NOTE — Telephone Encounter (Signed)
Andrea Mason, please advise if you have received an application on pt from [employer] that needs to be filled out by Dr. Melvyn Novas so she can be able to receive her Symbicort from them.

## 2021-12-21 NOTE — Addendum Note (Signed)
Addended by: Lorretta Harp on: 12/21/2021 10:53 AM   Modules accepted: Orders

## 2021-12-21 NOTE — Telephone Encounter (Signed)
Called and spoke with pt stating to her if she needs her Symbicort refilled with [employer], she needed to call them to schedule a shipment of her medication and she verbalized understanding. Pt also requested to have Rx sent to local pharmacy which I sent for pt. Nothing further needed.

## 2021-12-22 NOTE — Telephone Encounter (Signed)
Madelyn Brunner, CPhT to Me  [place], Waldemar Dickens, CMA      9:09 AM  [employer] prescription form had been placed in pharmacy box, Magda Paganini I can either bring it to you or go place it in Dr. Gustavus Bryant box. Just let me know    [place], will you please put in Dr Gustavus Bryant mailbox up front and I will get in later. Not sure how this ended up in the pharmacy box, sorry!

## 2021-12-23 ENCOUNTER — Telehealth: Payer: Self-pay

## 2021-12-23 MED ORDER — SOLIFENACIN SUCCINATE 5 MG PO TABS: 5.0000 mg | ORAL_TABLET | Freq: Every day | ORAL | 3 refills | Status: DC

## 2021-12-23 NOTE — Telephone Encounter (Signed)
Ok this is done erx 

## 2021-12-23 NOTE — Telephone Encounter (Signed)
Pt has stated her insurance has taken the medication darifenacin (ENABLEX) 7.5 MG 24 hr tablet off of the covered meds and sttes the alternative they do cover is Solifenacin. Pt is asking can the medication be switched to the covered medication and sent to her pharmacy.   Pt states she is out of the meds and is needing a refill as soon as possible.

## 2021-12-23 NOTE — Telephone Encounter (Signed)
Patient notified

## 2021-12-30 ENCOUNTER — Other Ambulatory Visit: Payer: Self-pay

## 2021-12-30 ENCOUNTER — Ambulatory Visit (INDEPENDENT_AMBULATORY_CARE_PROVIDER_SITE_OTHER): Payer: PPO | Admitting: Primary Care

## 2021-12-30 ENCOUNTER — Encounter: Payer: Self-pay | Admitting: Primary Care

## 2021-12-30 VITALS — BP 124/68 | HR 84 | Temp 98.5°F | Ht 62.0 in | Wt 177.0 lb

## 2021-12-30 DIAGNOSIS — J328 Other chronic sinusitis: Secondary | ICD-10-CM

## 2021-12-30 DIAGNOSIS — J45901 Unspecified asthma with (acute) exacerbation: Secondary | ICD-10-CM | POA: Diagnosis not present

## 2021-12-30 LAB — CBC WITH DIFFERENTIAL/PLATELET
Basophils Absolute: 0.1 10*3/uL (ref 0.0–0.1)
Basophils Relative: 1.3 % (ref 0.0–3.0)
Eosinophils Absolute: 1.8 10*3/uL — ABNORMAL HIGH (ref 0.0–0.7)
Eosinophils Relative: 30 % — ABNORMAL HIGH (ref 0.0–5.0)
HCT: 35.2 % — ABNORMAL LOW (ref 36.0–46.0)
Hemoglobin: 11.5 g/dL — ABNORMAL LOW (ref 12.0–15.0)
Lymphocytes Relative: 24.9 % (ref 12.0–46.0)
Lymphs Abs: 1.5 10*3/uL (ref 0.7–4.0)
MCHC: 32.7 g/dL (ref 30.0–36.0)
MCV: 92.3 fl (ref 78.0–100.0)
Monocytes Absolute: 0.3 10*3/uL (ref 0.1–1.0)
Monocytes Relative: 4.9 % (ref 3.0–12.0)
Neutro Abs: 2.4 10*3/uL (ref 1.4–7.7)
Neutrophils Relative %: 38.9 % — ABNORMAL LOW (ref 43.0–77.0)
Platelets: 198 10*3/uL (ref 150.0–400.0)
RBC: 3.82 Mil/uL — ABNORMAL LOW (ref 3.87–5.11)
RDW: 13.2 % (ref 11.5–15.5)
WBC: 6.1 10*3/uL (ref 4.0–10.5)

## 2021-12-30 MED ORDER — SYMBICORT 80-4.5 MCG/ACT IN AERO: 2.0000 | INHALATION_SPRAY | Freq: Two times a day (BID) | RESPIRATORY_TRACT | 11 refills | Status: DC

## 2021-12-30 MED ORDER — PREDNISONE 10 MG PO TABS: ORAL_TABLET | ORAL | 0 refills | Status: DC

## 2021-12-30 MED ORDER — AMOXICILLIN-POT CLAVULANATE 875-125 MG PO TABS: 1.0000 | ORAL_TABLET | Freq: Two times a day (BID) | ORAL | 0 refills | Status: DC

## 2021-12-30 NOTE — Patient Instructions (Addendum)
Recommendations: Continue Symbicort 19mcg - take two puffs morning and evening  Continue zyrtec, singulair, flonase and mucinex  Orders: Labs today (resp allergy panel)  Rx: Augmentin 1 tab twice daily x 10 days Prednisone taper   Follow-up: 3 months with Dr. Melvyn Novas

## 2021-12-30 NOTE — Assessment & Plan Note (Addendum)
-   Acute exacerbation of her chronic sinusitis. Sending in Augmentin 1 tab twice daily x 10 days. Continue singulair, zyrtec, flonase nasal spray and prn mucinex. Checking resp allergy panel/CBC with diff. May refer to allergy. Following with Dr. [name] with ENT.

## 2021-12-30 NOTE — Assessment & Plan Note (Signed)
-   Asthma exacerbation a/t acute sinusitis. Continue Symbicort 26mcg two puffs twice daily. Sending in prednisone taper. Patient receiving medication assistance through [employer].

## 2021-12-30 NOTE — Progress Notes (Signed)
@Patient  ID: Andrea Mason, female    DOB: 1951-11-09, 71 y.o.   MRN: [id number]  Chief Complaint  Patient presents with   Follow-up    Follow up. Pt states that her sinuses are acting up really bad these days. Pt states that her cough is still present. Pt states that her inhaler is helping her     Referring provider: Biagio Borg, MD  Brief patient profile:  71  yobf never smoker followed by Dr Loretha Brasil for allergic rhinitis and asthma   HPI: 71 year old female, never smoked.  Past medical history significant for cough variant asthma.  Patient of Dr. Melvyn Novas, last seen in office on 07/26/2021. Maintained on Symbicort 80, Singulair, flonase, flutter valve.   Previous [facility] pulmonary encounter: 07/26/2021  f/u ov/Wert re: rhinitis/ asthma maint on dulera 200 when ran out of symbicort   Chief Complaint  Patient presents with   Follow-up    Patient  reports that she is doing ok.     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   Sleeping  without nocturnal  or early am exacerbation  of respiratory  c/o's or need for noct saba. Also denies any obvious fluctuation of symptoms with weather or environmental changes or other aggravating or alleviating factors except as outlined above   No unusual exposure hx or h/o childhood pna/ asthma or knowledge of premature birth.  Current Allergies, Complete Past Medical History, Past Surgical History, Family History, and Social History were reviewed in [employer] record.   12/30/2021- Interim hx  Patient presents today for 6 month  follow-up. Patient has long standing hx chronic sinus congestion. Recently worse with cough and associate sob/wheezing x 1-2 months.  She is maintained on Symbicort 10mcg 2 puffs twice daily. She has been using albuterol inhaler more frequently, requiring 2-3 times a day. She is compliant with zyrtec, singulair, flonase and mucinex. She will be seeing Dr. [name] for follow-up in March to discuss sinus surgery. CT scan of sinuses in November 2022 showed extensive soft tissue opacification of all sinuses and the superior aspect of her nasal cavity consistent with chronic recurrent nasal polyposis. Last on steroids in November. There is a cat at work and his sister smokes. Allergy testing in 2014 was done that did not show an allergy to cats but her IGE was >1,000. May consider refer to allergy.    Allergies  Allergen Reactions   Ivp Dye [Iodinated Contrast Media] Nausea And Vomiting and Other (See Comments)    hot flashes   Promethazine-Codeine Nausea And Vomiting   Tramadol Other (See Comments)    Almost passed out    Immunization History  Administered Date(s) Administered   Influenza Split 09/03/2012   Influenza Whole 09/27/2006, 08/23/2007, 11/16/2009, 08/25/2010   Influenza,inj,Quad PF,6+ Mos 08/21/2013, 08/06/2014, 10/05/2015   PFIZER(Purple Top)SARS-COV-2 Vaccination 03/04/2020, 03/29/2020   Pneumococcal Conjugate-13 12/02/2014   Pneumococcal Polysaccharide-23 06/03/2019   Tdap 05/06/2013, 08/25/2015   Zoster, Live 05/06/2013    Past Medical History:  Diagnosis Date   Allergic rhinitis  Allergy    Asthma    Depression    DJD (degenerative joint disease), cervical    GERD (gastroesophageal reflux disease)    History of colonic polyps    Hypertension    Obesity    Primary hyperaldosteronism ([place]) 11/06/2013   Stroke ([place])    Syncope     Tobacco History: Social History   Tobacco Use  Smoking Status Never  Smokeless Tobacco Never   Counseling given: Not  Answered   Outpatient Medications Prior to Visit  Medication Sig Dispense Refill   acetaminophen (TYLENOL) 325 MG tablet Take as directed per bottle instructions     albuterol (VENTOLIN HFA) 108 (90 Base) MCG/ACT inhaler Inhale 1-2 puffs into the lungs every 6 (six) hours as needed for wheezing or shortness of breath.      aspirin 325 MG tablet Take 325 mg by mouth daily.     atorvastatin (LIPITOR) 10 MG tablet TAKE 1 TABLET(10 MG) BY MOUTH DAILY 90 tablet 3   baclofen (LIORESAL) 10 MG tablet Take 5 mg by mouth every 6 (six) hours as needed for muscle spasms.     Biotin 10000 MCG TABS Take 10,000 mcg by mouth daily.     cetirizine (ZYRTEC) 10 MG tablet Take 10 mg by mouth at bedtime.      Cholecalciferol (VITAMIN D3) 2000 units TABS Take 2,000 Units by mouth daily.     clotrimazole (MYCELEX) 10 MG troche Take 1 tablet (10 mg total) by mouth in the morning, at noon, in the evening, and at bedtime. 30 tablet 4   darifenacin (ENABLEX) 7.5 MG 24 hr tablet Take 1 tablet (7.5 mg total) by mouth daily. 90 tablet 3   dextromethorphan-guaiFENesin (MUCINEX DM) 30-600 MG 12hr tablet Take 1-2 tablets by mouth 2 (two) times daily as needed (for congestion/cough (WITH FLUTTER VALVE)).      famotidine (PEPCID) 20 MG tablet Take 1 tablet (20 mg total) by mouth at bedtime. 90 tablet 3   fluticasone (FLONASE) 50 MCG/ACT nasal spray Place 2 sprays into both nostrils daily. 16 g 5   lidocaine (LIDODERM) 5 % Place 1 patch onto the skin daily. Apply to painful area of chest, remove & discard patch every 12hrs allowing 12hr without patch present 30 patch 0   Magnesium 250 MG TABS Take 1 tablet by mouth daily.     montelukast (SINGULAIR) 10 MG tablet TAKE 1 TABLET(10 MG) BY MOUTH AT BEDTIME 30 tablet 11   oxymetazoline (AFRIN) 0.05 % nasal spray Place 2 sprays into both nostrils 2 (two) times daily as needed (nasal stuffiness and nasal pressure for 5 days).      pantoprazole (PROTONIX) 40 MG tablet TAKE 1 TABLET(40  MG) BY MOUTH DAILY 90 tablet 3   Polyethylene Glycol 3350 (MIRALAX PO) 1 capful daily as needed     Respiratory Therapy Supplies (FLUTTER) DEVI Use as directed     sodium chloride (OCEAN) 0.65 % SOLN nasal spray Place 2 sprays into both nostrils as needed (dry nose).      solifenacin (VESICARE) 5 MG tablet Take 1 tablet (5 mg total) by mouth daily. 90 tablet 3   spironolactone (ALDACTONE) 25 MG tablet Take 1 tablet (25 mg total) by mouth daily. 90 tablet 1   budesonide-formoterol (SYMBICORT) 80-4.5 MCG/ACT inhaler Inhale 2 puffs into the lungs 2 (two) times daily. 10.2 g 5   SYMBICORT 80-4.5 MCG/ACT inhaler Inhale 2 puffs by mouth twice daily 11 g 11   No facility-administered medications prior to  visit.    Review of Systems  Review of Systems  Constitutional: Negative.   HENT:  Positive for congestion and rhinorrhea.   Respiratory:  Positive for cough, shortness of breath and wheezing.     Physical Exam  BP 124/68 (BP Location: Left Arm, Patient Position: Sitting, Cuff Size: Normal)    Pulse 84    Temp 98.5 F (36.9 C) (Oral)    Ht 5\' 2"  (1.575 m)    Wt 177 lb (80.3 kg)    LMP  (LMP Unknown)    SpO2 100%    BMI 32.37 kg/m  Physical Exam Constitutional:      Appearance: Normal appearance.  HENT:     Head: Normocephalic and atraumatic.     Mouth/Throat:     Mouth: Mucous membranes are moist.     Pharynx: Oropharynx is clear.  Cardiovascular:     Rate and Rhythm: Normal rate and regular rhythm.  Pulmonary:     Effort: Pulmonary effort is normal.     Breath sounds: Wheezing present.  Musculoskeletal:        General: Normal range of motion.  Skin:    General: Skin is warm and dry.  Neurological:     General: No focal deficit present.     Mental Status: She is alert and oriented to person, place, and time. Mental status is at baseline.  Psychiatric:        Mood and Affect: Mood normal.        Behavior: Behavior normal.        Thought Content: Thought content normal.         Judgment: Judgment normal.     Lab Results:  CBC    Component Value Date/Time   WBC 6.0 04/05/2021 1606   RBC 3.84 (L) 04/05/2021 1606   HGB 11.8 (L) 04/05/2021 1606   HCT 34.9 (L) 04/05/2021 1606   PLT 204.0 04/05/2021 1606   MCV 90.9 04/05/2021 1606   MCH 29.2 08/09/2020 1621   MCHC 33.7 04/05/2021 1606   RDW 13.8 04/05/2021 1606   LYMPHSABS 1.8 04/05/2021 1606   MONOABS 0.4 04/05/2021 1606   EOSABS 1.4 (H) 04/05/2021 1606   BASOSABS 0.1 04/05/2021 1606    BMET    Component Value Date/Time   NA 136 04/05/2021 1606   K 4.3 04/05/2021 1606   CL 101 04/05/2021 1606   CO2 30 04/05/2021 1606   GLUCOSE 83 04/05/2021 1606   BUN 15 04/05/2021 1606   CREATININE 1.05 04/05/2021 1606   CREATININE 1.09 (H) 09/09/2020 0938   CALCIUM 9.4 04/05/2021 1606   GFRNONAA 52 (L) 09/09/2020 0938   GFRAA 61 09/09/2020 0938    BNP No results found for: BNP  ProBNP No results found for: PROBNP  Imaging: No results found.   Assessment & Plan:   RHINOSINUSITIS, CHRONIC - Acute exacerbation of her chronic sinusitis. Sending in Augmentin 1 tab twice daily x 10 days. Continue singulair, zyrtec, flonase nasal spray and prn mucinex. Checking resp allergy panel/CBC with diff. May refer to allergy. Following with Dr. [name] with ENT.   Asthma with exacerbation - Asthma exacerbation a/t acute sinusitis. Continue Symbicort 42mcg two puffs twice daily. Sending in prednisone taper. Patient receiving medication assistance through [facility].    Martyn Ehrich, NP 12/30/2021

## 2022-01-02 ENCOUNTER — Other Ambulatory Visit: Payer: Self-pay | Admitting: *Deleted

## 2022-01-02 DIAGNOSIS — J45991 Cough variant asthma: Secondary | ICD-10-CM

## 2022-01-02 DIAGNOSIS — J45901 Unspecified asthma with (acute) exacerbation: Secondary | ICD-10-CM

## 2022-01-02 LAB — RESPIRATORY ALLERGY PROFILE REGION II ~~LOC~~
Allergen, A. alternata, m6: <0.1 kU/L
Allergen, Cedar tree, t12: 1.85 kU/L — ABNORMAL HIGH
Allergen, Comm Silver Birch, t9: 0.36 kU/L — ABNORMAL HIGH
Allergen, Cottonwood, t14: 0.4 kU/L — ABNORMAL HIGH
Allergen, D pternoyssinus,d7: 2.28 kU/L — ABNORMAL HIGH
Allergen, Mouse Urine Protein, e78: <0.1 kU/L
Allergen, Mulberry, t76: 0.35 kU/L — ABNORMAL HIGH
Allergen, Oak,t7: 0.43 kU/L — ABNORMAL HIGH
Allergen, P. notatum, m1: <0.1 kU/L
Aspergillus fumigatus, m3: 2.19 kU/L — ABNORMAL HIGH
Bermuda Grass: 0.92 kU/L — ABNORMAL HIGH
Box Elder IgE: 0.43 kU/L — ABNORMAL HIGH
CLADOSPORIUM HERBARUM (M2) IGE: 0.1 kU/L — ABNORMAL HIGH
COMMON RAGWEED (SHORT) (W1) IGE: 3.46 kU/L — ABNORMAL HIGH
Cat Dander: <0.1 kU/L
Class: 0
Class: 0
Class: 0
Class: 0
Class: 1
Class: 1
Class: 1
Class: 1
Class: 1
Class: 1
Class: 1
Class: 1
Class: 2
Class: 2
Class: 2
Class: 2
Class: 2
Class: 2
Class: 2
Class: 2
Class: 2
Class: 2
Cockroach: 1.24 kU/L — ABNORMAL HIGH
D. farinae: 2.17 kU/L — ABNORMAL HIGH
Dog Dander: 0.18 kU/L — ABNORMAL HIGH
Elm IgE: 0.71 kU/L — ABNORMAL HIGH
IgE (Immunoglobulin E), Serum: 1937 kU/L — ABNORMAL HIGH (ref <=114)
Johnson Grass: 1.14 kU/L — ABNORMAL HIGH
Pecan/Hickory Tree IgE: 0.41 kU/L — ABNORMAL HIGH
Rough Pigweed  IgE: 0.4 kU/L — ABNORMAL HIGH
Sheep Sorrel IgE: 0.41 kU/L — ABNORMAL HIGH
Timothy Grass: 1.55 kU/L — ABNORMAL HIGH

## 2022-01-02 LAB — INTERPRETATION:

## 2022-01-02 NOTE — Progress Notes (Signed)
She has multiple allergies including dust mites, dog dander, trees/ grasses, ragweed. Please refer to allergy.

## 2022-01-03 ENCOUNTER — Ambulatory Visit: Payer: PPO | Admitting: Internal Medicine

## 2022-01-31 ENCOUNTER — Ambulatory Visit: Payer: PPO | Admitting: Internal Medicine

## 2022-02-14 ENCOUNTER — Ambulatory Visit: Payer: PPO | Admitting: Allergy & Immunology

## 2022-02-15 DIAGNOSIS — J310 Chronic rhinitis: Secondary | ICD-10-CM | POA: Diagnosis not present

## 2022-02-15 DIAGNOSIS — J339 Nasal polyp, unspecified: Secondary | ICD-10-CM | POA: Diagnosis not present

## 2022-03-06 ENCOUNTER — Encounter: Payer: Self-pay | Admitting: Internal Medicine

## 2022-03-06 ENCOUNTER — Ambulatory Visit (INDEPENDENT_AMBULATORY_CARE_PROVIDER_SITE_OTHER): Payer: PPO | Admitting: Internal Medicine

## 2022-03-06 VITALS — BP 100/60 | HR 75 | Temp 98.0°F | Ht 62.0 in | Wt 181.0 lb

## 2022-03-06 DIAGNOSIS — D649 Anemia, unspecified: Secondary | ICD-10-CM | POA: Diagnosis not present

## 2022-03-06 DIAGNOSIS — R739 Hyperglycemia, unspecified: Secondary | ICD-10-CM | POA: Diagnosis not present

## 2022-03-06 DIAGNOSIS — E538 Deficiency of other specified B group vitamins: Secondary | ICD-10-CM | POA: Diagnosis not present

## 2022-03-06 DIAGNOSIS — E7849 Other hyperlipidemia: Secondary | ICD-10-CM

## 2022-03-06 DIAGNOSIS — E559 Vitamin D deficiency, unspecified: Secondary | ICD-10-CM | POA: Diagnosis not present

## 2022-03-06 DIAGNOSIS — Z0000 Encounter for general adult medical examination without abnormal findings: Secondary | ICD-10-CM

## 2022-03-06 DIAGNOSIS — R350 Frequency of micturition: Secondary | ICD-10-CM

## 2022-03-06 DIAGNOSIS — N3281 Overactive bladder: Secondary | ICD-10-CM | POA: Diagnosis not present

## 2022-03-06 LAB — BASIC METABOLIC PANEL
BUN: 10 mg/dL (ref 6–23)
CO2: 30 mEq/L (ref 19–32)
Calcium: 9.8 mg/dL (ref 8.4–10.5)
Chloride: 102 mEq/L (ref 96–112)
Creatinine, Ser: 1.03 mg/dL (ref 0.40–1.20)
GFR: 55.62 mL/min — ABNORMAL LOW (ref >60.00)
Glucose, Bld: 90 mg/dL (ref 70–99)
Potassium: 4.4 mEq/L (ref 3.5–5.1)
Sodium: 137 mEq/L (ref 135–145)

## 2022-03-06 LAB — CBC WITH DIFFERENTIAL/PLATELET
Basophils Absolute: 0.1 10*3/uL (ref 0.0–0.1)
Basophils Relative: 1.3 % (ref 0.0–3.0)
Eosinophils Absolute: 1.7 10*3/uL — ABNORMAL HIGH (ref 0.0–0.7)
Eosinophils Relative: 25.3 % — ABNORMAL HIGH (ref 0.0–5.0)
HCT: 36 % (ref 36.0–46.0)
Hemoglobin: 11.9 g/dL — ABNORMAL LOW (ref 12.0–15.0)
Lymphocytes Relative: 26.6 % (ref 12.0–46.0)
Lymphs Abs: 1.8 10*3/uL (ref 0.7–4.0)
MCHC: 33.2 g/dL (ref 30.0–36.0)
MCV: 92.7 fl (ref 78.0–100.0)
Monocytes Absolute: 0.5 10*3/uL (ref 0.1–1.0)
Monocytes Relative: 7.6 % (ref 3.0–12.0)
Neutro Abs: 2.6 10*3/uL (ref 1.4–7.7)
Neutrophils Relative %: 39.2 % — ABNORMAL LOW (ref 43.0–77.0)
Platelets: 204 10*3/uL (ref 150.0–400.0)
RBC: 3.88 Mil/uL (ref 3.87–5.11)
RDW: 12.8 % (ref 11.5–15.5)
WBC: 6.7 10*3/uL (ref 4.0–10.5)

## 2022-03-06 LAB — URINALYSIS, ROUTINE W REFLEX MICROSCOPIC
Bilirubin Urine: NEGATIVE
Hgb urine dipstick: NEGATIVE
Ketones, ur: NEGATIVE
Leukocytes,Ua: NEGATIVE
Nitrite: NEGATIVE
RBC / HPF: NONE SEEN (ref 0–?)
Specific Gravity, Urine: <=1.005 — AB (ref 1.000–1.030)
Total Protein, Urine: NEGATIVE
Urine Glucose: NEGATIVE
Urobilinogen, UA: 0.2 (ref 0.0–1.0)
WBC, UA: NONE SEEN (ref 0–?)
pH: 6 (ref 5.0–8.0)

## 2022-03-06 LAB — HEPATIC FUNCTION PANEL
ALT: 21 U/L (ref 0–35)
AST: 25 U/L (ref 0–37)
Albumin: 4.2 g/dL (ref 3.5–5.2)
Alkaline Phosphatase: 91 U/L (ref 39–117)
Bilirubin, Direct: 0.3 mg/dL (ref 0.0–0.3)
Total Bilirubin: 1 mg/dL (ref 0.2–1.2)
Total Protein: 7.5 g/dL (ref 6.0–8.3)

## 2022-03-06 LAB — LIPID PANEL
Cholesterol: 79 mg/dL (ref 0–200)
HDL: 47 mg/dL (ref >39.00)
LDL Cholesterol: 10 mg/dL (ref 0–99)
NonHDL: 32.22
Total CHOL/HDL Ratio: 2
Triglycerides: 111 mg/dL (ref 0.0–149.0)
VLDL: 22.2 mg/dL (ref 0.0–40.0)

## 2022-03-06 LAB — VITAMIN B12: Vitamin B-12: >1504 pg/mL — ABNORMAL HIGH (ref 211–911)

## 2022-03-06 LAB — IBC PANEL
Iron: 126 ug/dL (ref 42–145)
Saturation Ratios: 30.2 % (ref 20.0–50.0)
TIBC: 417.2 ug/dL (ref 250.0–450.0)
Transferrin: 298 mg/dL (ref 212.0–360.0)

## 2022-03-06 LAB — HEMOGLOBIN A1C: Hgb A1c MFr Bld: 6.1 % (ref 4.6–6.5)

## 2022-03-06 LAB — FERRITIN: Ferritin: 12.2 ng/mL (ref 10.0–291.0)

## 2022-03-06 LAB — TSH: TSH: 1.66 u[IU]/mL (ref 0.35–5.50)

## 2022-03-06 LAB — VITAMIN D 25 HYDROXY (VIT D DEFICIENCY, FRACTURES): VITD: 46.93 ng/mL (ref 30.00–100.00)

## 2022-03-06 NOTE — Progress Notes (Signed)
Patient ID: Andrea Mason, female   DOB: 1951/04/06, 71 y.o.   MRN: [id number] ? ? ? ?     Chief Complaint:: wellness exam and Office Visit (Patient states that she has to "sit too long" before urinating) ?  ? ?     HPI:  Andrea Mason is a 71 y.o. female here for wellness exam; declines covid booster, shingrix, dxa, o/w up to date ?         ?              Also Denies urinary symptoms such as dysuria, urgency, flank pain, hematuria or n/v, fever, chills but has worsening urinary frequency not well controlled on lower dose enablex.  Also has slow start to urination when man upstairs apartment seems to know when she is going to BR in her own apartment and makes noise upstairs for several years now.  Taking low dose Vit d.  Taking B12 1000 mcg daily.  No overt bleeding, bruising.  Pt denies chest pain, increased sob or doe, wheezing, orthopnea, PND, increased LE swelling, palpitations, dizziness or syncope.   Pt denies polydipsia, polyuria, or new focal neuro s/s.    Pt denies fever, wt loss, night sweats, loss of appetite, or other constitutional symptoms  ?  ?Wt Readings from Last 3 Encounters:  ?03/06/22 181 lb (82.1 kg)  ?12/30/21 177 lb (80.3 kg)  ?07/26/21 182 lb 12.8 oz (82.9 kg)  ? ?BP Readings from Last 3 Encounters:  ?03/06/22 100/60  ?12/30/21 124/68  ?07/26/21 110/70  ? ?Immunization History  ?Administered Date(s) Administered  ? Influenza Split 09/03/2012  ? Influenza Whole 09/27/2006, 08/23/2007, 11/16/2009, 08/25/2010  ? Influenza,inj,Quad PF,6+ Mos 08/21/2013, 08/06/2014, 10/05/2015  ? PFIZER(Purple Top)SARS-COV-2 Vaccination 03/04/2020, 03/29/2020  ? Pneumococcal Conjugate-13 12/02/2014  ? Pneumococcal Polysaccharide-23 06/03/2019  ? Tdap 05/06/2013, 08/25/2015  ? Zoster, Live 05/06/2013  ? ?Health Maintenance Due  ?Topic Date Due  ? MAMMOGRAM  04/03/2018  ? ?  ? ?Past Medical History:  ?Diagnosis Date  ? Allergic rhinitis   ? Allergy   ? Asthma   ? Depression   ? DJD (degenerative joint disease),  cervical   ? GERD (gastroesophageal reflux disease)   ? History of colonic polyps   ? Hypertension   ? Obesity   ? Primary hyperaldosteronism ([place]) 11/06/2013  ? Stroke [facility])   ? Syncope   ? ?Past Surgical History:  ?Procedure Laterality Date  ? COLONOSCOPY    ? FOOT SURGERY  1998  ? Implantable loop recorder placement  09/16/14  ? 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 LOOP RECORDER REMOVAL N/A 10/02/2017  ? Procedure: LOOP RECORDER REMOVAL;  Surgeon: [name], MD;  Location: [facility];  Service: Cardiovascular;  Laterality: N/A;  ? NASAL POLYP SURGERY  07/2006  ? x 2 with Dr. [name]  ? NASAL SINUS SURGERY  07/2006  ? Dr. [name]  ? TUBAL LIGATION    ? ? reports that she has never smoked. She has never used smokeless tobacco. She reports current alcohol use. She reports that she does not use drugs. ?family history includes Arrhythmia in her brother; Cancer in her mother; Colon cancer in her brother; Congenital heart disease in her brother; Diabetes in her sister; Heart attack in her brother; Lung disease in her son; Stroke in her brother. ?Allergies  ?Allergen Reactions  ? Ivp Dye [Iodinated Contrast Media] Nausea And Vomiting and Other (See Comments)  ?  hot  flashes  ? Promethazine-Codeine Nausea And Vomiting  ? Tramadol Other (See Comments)  ?  Almost passed out  ? ?Current Outpatient Medications on File Prior to Visit  ?Medication Sig Dispense Refill  ? acetaminophen (TYLENOL) 325 MG tablet Take as directed per bottle instructions    ? albuterol (VENTOLIN HFA) 108 (90 Base) MCG/ACT inhaler Inhale 1-2 puffs into the lungs every 6 (six) hours as needed for wheezing or shortness of breath.     ? aspirin 325 MG tablet Take 325 mg by mouth daily.    ? atorvastatin (LIPITOR) 10 MG tablet TAKE 1 TABLET(10 MG) BY MOUTH DAILY 90 tablet 3  ? baclofen (LIORESAL) 10 MG tablet Take 5 mg by mouth every 6 (six) hours as needed for muscle spasms.    ? Biotin 10000 MCG TABS  Take 10,000 mcg by mouth daily.    ? cetirizine (ZYRTEC) 10 MG tablet Take 10 mg by mouth at bedtime.     ? Cholecalciferol (VITAMIN D3) 2000 units TABS Take 2,000 Units by mouth daily.    ? clotrimazole (MYCELEX) 10 MG troche Take 1 tablet (10 mg total) by mouth in the morning, at noon, in the evening, and at bedtime. 30 tablet 4  ? darifenacin (ENABLEX) 7.5 MG 24 hr tablet Take 1 tablet (7.5 mg total) by mouth daily. 90 tablet 3  ? dextromethorphan-guaiFENesin (MUCINEX DM) 30-600 MG 12hr tablet Take 1-2 tablets by mouth 2 (two) times daily as needed (for congestion/cough (WITH FLUTTER VALVE)).     ? famotidine (PEPCID) 20 MG tablet Take 1 tablet (20 mg total) by mouth at bedtime. 90 tablet 3  ? fluticasone (FLONASE) 50 MCG/ACT nasal spray Place 2 sprays into both nostrils daily. 16 g 5  ? lidocaine (LIDODERM) 5 % Place 1 patch onto the skin daily. Apply to painful area of chest, remove & discard patch every 12hrs allowing 12hr without patch present 30 patch 0  ? Magnesium 250 MG TABS Take 1 tablet by mouth daily.    ? montelukast (SINGULAIR) 10 MG tablet TAKE 1 TABLET(10 MG) BY MOUTH AT BEDTIME 30 tablet 11  ? oxymetazoline (AFRIN) 0.05 % nasal spray Place 2 sprays into both nostrils 2 (two) times daily as needed (nasal stuffiness and nasal pressure for 5 days).     ? pantoprazole (PROTONIX) 40 MG tablet TAKE 1 TABLET(40 MG) BY MOUTH DAILY 90 tablet 3  ? Polyethylene Glycol 3350 (MIRALAX PO) 1 capful daily as needed    ? Respiratory Therapy Supplies (FLUTTER) DEVI Use as directed    ? sodium chloride (OCEAN) 0.65 % SOLN nasal spray Place 2 sprays into both nostrils as needed (dry nose).     ? solifenacin (VESICARE) 5 MG tablet Take 1 tablet (5 mg total) by mouth daily. 90 tablet 3  ? spironolactone (ALDACTONE) 25 MG tablet Take 1 tablet (25 mg total) by mouth daily. 90 tablet 1  ? SYMBICORT 80-4.5 MCG/ACT inhaler Inhale 2 puffs into the lungs 2 (two) times daily. 11 g 11  ? ?No current facility-administered  medications on file prior to visit.  ? ?     ROS:  All others reviewed and negative. ? ?Objective  ? ?     PE:  BP 100/60 (BP Location: Right Arm, Patient Position: Sitting, Cuff Size: Large)   Pulse 75   Temp 98 ?F (36.7 ?C) (Oral)   Ht '5\' 2"'$  (1.575 m)   Wt 181 lb (82.1 kg)   LMP  (LMP Unknown)  SpO2 100%   BMI 33.11 kg/m?  ? ?              Constitutional: Pt appears in NAD ?              HENT: Head: NCAT.  ?              Right Ear: External ear normal.   ?              Left Ear: External ear normal.  ?              Eyes: . Pupils are equal, round, and reactive to light. Conjunctivae and EOM are normal ?              Nose: without d/c or deformity ?              Neck: Neck supple. Gross normal ROM ?              Cardiovascular: Normal rate and regular rhythm.   ?              Pulmonary/Chest: Effort normal and breath sounds without rales or wheezing.  ?              Abd:  Soft, NT, ND, + BS, no organomegaly ?              Neurological: Pt is alert. At baseline orientation, motor grossly intact ?              Skin: Skin is warm. No rashes, no other new lesions, LE edema - none ?              Psychiatric: Pt behavior is normal without agitation  ? ?Micro: none ? ?Cardiac tracings I have personally interpreted today:  none ? ?Pertinent Radiological findings (summarize): none  ? ?Lab Results  ?Component Value Date  ? WBC 6.7 03/06/2022  ? HGB 11.9 (L) 03/06/2022  ? HCT 36.0 03/06/2022  ? PLT 204.0 03/06/2022  ? GLUCOSE 90 03/06/2022  ? CHOL 79 03/06/2022  ? TRIG 111.0 03/06/2022  ? HDL 47.00 03/06/2022  ? [place] 10 03/06/2022  ? ALT 21 03/06/2022  ? AST 25 03/06/2022  ? NA 137 03/06/2022  ? K 4.4 03/06/2022  ? CL 102 03/06/2022  ? CREATININE 1.03 03/06/2022  ? BUN 10 03/06/2022  ? CO2 30 03/06/2022  ? TSH 1.66 03/06/2022  ? INR 1.0 12/26/2019  ? HGBA1C 6.1 03/06/2022  ? ?Assessment/Plan:  ?Andrea Mason is a 71 y.o. Black or African American [2] female with  has a past medical history of Allergic rhinitis,  Allergy, Asthma, Depression, DJD (degenerative joint disease), cervical, GERD (gastroesophageal reflux disease), History of colonic polyps, Hypertension, Obesity, Primary hyperaldosteronism ([place]) (11/06/2013), Stroke ([place]),

## 2022-03-06 NOTE — Assessment & Plan Note (Signed)
[place] for increased enablex 15 qd ?

## 2022-03-06 NOTE — Assessment & Plan Note (Signed)
Last vitamin D ?Lab Results  ?Component Value Date  ? VD25OH 46.93 03/06/2022  ? ?Stable, cont oral replacement ? ?

## 2022-03-06 NOTE — Assessment & Plan Note (Signed)
Also for urine cx with labs ?

## 2022-03-06 NOTE — Assessment & Plan Note (Signed)
Also for f/u cbc, iron lab ?

## 2022-03-06 NOTE — Assessment & Plan Note (Signed)
Lab Results  ?Component Value Date  ? HGBA1C 6.1 03/06/2022  ? ?Stable, pt to continue current medical treatment  - diet ? ?

## 2022-03-06 NOTE — Assessment & Plan Note (Signed)
Lab Results  ?Component Value Date  ? [place] 10 03/06/2022  ? ?Stable, pt to continue current statin lipitor ? ?

## 2022-03-06 NOTE — Assessment & Plan Note (Signed)
Age and sex appropriate education and counseling updated with regular exercise and diet ?Referrals for preventative services - delcines dxa ?Immunizations addressed - declinees covid boster ?Smoking counseling  - none needed ?Evidence for depression or other mood disorder - none significant ?Most recent labs reviewed. ?I have personally reviewed and have noted: ?1) the patient's medical and social history ?2) The patient's current medications and supplements ?3) The patient's height, weight, and BMI have been recorded in the chart ? ?

## 2022-03-06 NOTE — Patient Instructions (Signed)
Ok to increase the enablex for the bladder to 15 mg per day ? ?Please take OTC Vitamin D3 as we discussed ? ?Ok to decresed the B12 to every other day ? ?Please continue all other medications as before, and refills have been done if requested. ? ?Please have the pharmacy call with any other refills you may need. ? ?Please continue your efforts at being more active, low cholesterol diet, and weight control. ? ?You are otherwise up to date with prevention measures today. ? ?Please keep your appointments with your specialists as you may have planned ? ?Please go to the LAB at the blood drawing area for the tests to be done ? ?You will be contacted by phone if any changes need to be made immediately.  Otherwise, you will receive a letter about your results with an explanation, but please check with MyChart first. ? ?Please remember to sign up for MyChart if you have not done so, as this will be important to you in the future with finding out test results, communicating by private email, and scheduling acute appointments online when needed. ? ?Please make an Appointment to return in 6 months, or sooner if neede ?

## 2022-03-08 LAB — URINE CULTURE: Result:: NO GROWTH

## 2022-03-23 ENCOUNTER — Encounter (HOSPITAL_COMMUNITY): Payer: Self-pay | Admitting: Pharmacy Technician

## 2022-03-23 ENCOUNTER — Telehealth: Payer: Self-pay | Admitting: Internal Medicine

## 2022-03-23 ENCOUNTER — Emergency Department (HOSPITAL_COMMUNITY)
Admission: EM | Admit: 2022-03-23 | Discharge: 2022-03-23 | Disposition: A | Discharge status: Home / Self Care | Payer: PPO | Attending: Emergency Medicine | Admitting: Emergency Medicine

## 2022-03-23 ENCOUNTER — Other Ambulatory Visit: Payer: Self-pay

## 2022-03-23 DIAGNOSIS — I10 Essential (primary) hypertension: Secondary | ICD-10-CM | POA: Insufficient documentation

## 2022-03-23 DIAGNOSIS — Z8673 Personal history of transient ischemic attack (TIA), and cerebral infarction without residual deficits: Secondary | ICD-10-CM | POA: Diagnosis not present

## 2022-03-23 DIAGNOSIS — J45909 Unspecified asthma, uncomplicated: Secondary | ICD-10-CM | POA: Insufficient documentation

## 2022-03-23 DIAGNOSIS — Z7982 Long term (current) use of aspirin: Secondary | ICD-10-CM | POA: Diagnosis not present

## 2022-03-23 NOTE — ED Provider Notes (Signed)
?[facility] ?Provider Note ? ? ?CSN: [id number] ?Arrival date & time: 03/23/22  1824 ? ?  ? ?History ? ?Chief Complaint  ?Patient presents with  ? Hypertension  ? ? ?Andrea Mason is a 71 y.o. female.  Patient presents to the hospital with a chief complaint of high blood pressure.  Blood pressure prior to arrival according to EMS was 152/80.  Nurse at patient's job was concerned about patient's blood pressure.  Patient has no complaints or concerns at this time other than potential hypertension.  Blood pressure at this time 126/98.  Denies chest pain, shortness of breath, altered level of consciousness, headache, vision changes.  MH significant for hypertension, obesity, GERD, asthma, previous CVA ? ?HPI ? ?  ? ?Home Medications ?Prior to Admission medications   ?Medication Sig Start Date End Date Taking? Authorizing Provider  ?acetaminophen (TYLENOL) 325 MG tablet Take as directed per bottle instructions    [provider]  ?albuterol (VENTOLIN HFA) 108 (90 Base) MCG/ACT inhaler Inhale 1-2 puffs into the lungs every 6 (six) hours as needed for wheezing or shortness of breath.     [provider]  ?aspirin 325 MG tablet Take 325 mg by mouth daily.    [provider]  ?atorvastatin (LIPITOR) 10 MG tablet TAKE 1 TABLET(10 MG) BY MOUTH DAILY 04/28/21   Biagio Borg, MD  ?baclofen (LIORESAL) 10 MG tablet Take 5 mg by mouth every 6 (six) hours as needed for muscle spasms.    [provider]  ?Biotin 10000 MCG TABS Take 10,000 mcg by mouth daily.    [provider]  ?cetirizine (ZYRTEC) 10 MG tablet Take 10 mg by mouth at bedtime.     [provider]  ?Cholecalciferol (VITAMIN D3) 2000 units TABS Take 2,000 Units by mouth daily.    [provider]  ?clotrimazole (MYCELEX) 10 MG troche Take 1 tablet (10 mg total) by mouth in the morning, at noon, in the evening, and at bedtime. 07/05/21   Tanda Rockers, MD  ?darifenacin  (ENABLEX) 7.5 MG 24 hr tablet Take 1 tablet (7.5 mg total) by mouth daily. 03/30/21   Biagio Borg, MD  ?dextromethorphan-guaiFENesin [facility] DM) 30-600 MG 12hr tablet Take 1-2 tablets by mouth 2 (two) times daily as needed (for congestion/cough (WITH FLUTTER VALVE)).     [provider]  ?famotidine (PEPCID) 20 MG tablet Take 1 tablet (20 mg total) by mouth at bedtime. 12/31/19   Biagio Borg, MD  ?fluticasone Asencion Islam) 50 MCG/ACT nasal spray Place 2 sprays into both nostrils daily. 06/10/21   Tanda Rockers, MD  ?lidocaine (LIDODERM) 5 % Place 1 patch onto the skin daily. Apply to painful area of chest, remove & discard patch every 12hrs allowing 12hr without patch present 08/10/20   Renold Genta, MD  ?Magnesium 250 MG TABS Take 1 tablet by mouth daily.    [provider]  ?montelukast (SINGULAIR) 10 MG tablet TAKE 1 TABLET(10 MG) BY MOUTH AT BEDTIME 06/12/21   Tanda Rockers, MD  ?oxymetazoline (AFRIN) 0.05 % nasal spray Place 2 sprays into both nostrils 2 (two) times daily as needed (nasal stuffiness and nasal pressure for 5 days).     [provider]  ?pantoprazole (PROTONIX) 40 MG tablet TAKE 1 TABLET(40 MG) BY MOUTH DAILY 07/12/21   Biagio Borg, MD  ?Polyethylene Glycol 3350 (MIRALAX PO) 1 capful daily as needed    [provider]  ?Respiratory Therapy Supplies (FLUTTER)  DEVI Use as directed    [provider]  ?sodium chloride (OCEAN) 0.65 % SOLN nasal spray Place 2 sprays into both nostrils as needed (dry nose).     [provider]  ?solifenacin (VESICARE) 5 MG tablet Take 1 tablet (5 mg total) by mouth daily. 12/23/21   Biagio Borg, MD  ?spironolactone (ALDACTONE) 25 MG tablet Take 1 tablet (25 mg total) by mouth daily. 09/22/21   Biagio Borg, MD  ?Dellis Anes 80-4.5 MCG/ACT inhaler Inhale 2 puffs into the lungs 2 (two) times daily. 12/30/21   Martyn Ehrich, NP  ?   ? ?Allergies    ?Ivp dye [iodinated contrast media], Promethazine-codeine, and  Tramadol   ? ?Review of Systems   ?Review of Systems  ?Constitutional:  Negative for fatigue.  ?Eyes:  Negative for visual disturbance.  ?Respiratory:  Negative for shortness of breath.   ?Cardiovascular:  Negative for chest pain.  ?Gastrointestinal:  Negative for abdominal pain.  ?Neurological:  Negative for headaches.  ? ?Physical Exam ?Updated Vital Signs ?BP (!) 126/98 (BP Location: Right Arm)   Pulse 68   Temp 98.2 ?F (36.8 ?C) (Oral)   Resp 16   LMP  (LMP Unknown)   SpO2 100%  ?Physical Exam ?Constitutional:   ?   General: She is not in acute distress. ?HENT:  ?   Head: Normocephalic.  ?Eyes:  ?   Conjunctiva/sclera: Conjunctivae normal.  ?Cardiovascular:  ?   Rate and Rhythm: Normal rate and regular rhythm.  ?   Pulses: Normal pulses.  ?Pulmonary:  ?   Effort: Pulmonary effort is normal.  ?   Breath sounds: Normal breath sounds.  ?Musculoskeletal:  ?   Cervical back: Normal range of motion.  ?Skin: ?   General: Skin is warm and dry.  ?Neurological:  ?   Mental Status: She is alert and oriented to person, place, and time.  ? ? ?ED Results / Procedures / Treatments   ?Labs ?(all labs ordered are listed, but only abnormal results are displayed) ?Labs Reviewed - No data to display ? ?EKG ?None ? ?Radiology ?No results found. ? ?Procedures ?Procedures  ? ? ?Medications Ordered in ED ?Medications - No data to display ? ?ED Course/ Medical Decision Making/ A&P ?  ?                        ?Medical Decision Making ? ?The patient has no complaints at this time and has very mild hypertension.  The blood pressure is not a concern from the emergency standpoint.  Combining the blood pressure with the patient's lack of symptoms, I see no reason for any further work-up at this time.  I have discussed this with the patient who is happy to discharge home.  I have provided her with return precautions such as chest pain, shortness of breath, or altered level of consciousness. ? ?Final Clinical Impression(s) / ED  Diagnoses ?Final diagnoses:  ?Hypertension, unspecified type  ? ? ?Rx / DC Orders ?ED Discharge Orders   ? ? None  ? ?  ? ? ?  ?Dorothyann Peng, PA-C ?03/23/22 1847 ? ?  ?Daleen Bo, MD ?03/24/22 0017 ? ?

## 2022-03-23 NOTE — Discharge Instructions (Signed)
You were seen today for high blood pressure.  Your blood pressure was 126/98.  As discussed, I would recommend discussing blood pressure medication with your primary care doctor.  Blood pressures do fluctuate throughout the day.  Return to the hospital if you have any emergent symptoms such as chest pain, shortness of breath, or altered level of consciousness ?

## 2022-03-23 NOTE — ED Notes (Signed)
Patient Alert and oriented to baseline. Stable and ambulatory to baseline. Patient verbalized understanding of the discharge instructions.  Patient belongings were taken by the patient.   

## 2022-03-23 NOTE — ED Triage Notes (Signed)
Pt here with complaints of hypertension despite taking antihypertensive medications over the last few weeks. Pt BP 152/80. Pt with no complaints. Denies chest pain, shob, No neuro deficits noted.  ?

## 2022-03-23 NOTE — Telephone Encounter (Signed)
Pt states her bp as of 4-20 reading is 151/100 ? ?Pt states she has a history of strokes and feels tightening on the right side of her body ? ?Pt transferred to [facility] ?

## 2022-03-24 ENCOUNTER — Telehealth: Payer: Self-pay

## 2022-03-24 ENCOUNTER — Ambulatory Visit: Payer: PPO | Admitting: Student

## 2022-03-24 ENCOUNTER — Encounter: Payer: Self-pay | Admitting: Student

## 2022-03-24 ENCOUNTER — Telehealth: Payer: Self-pay | Admitting: Internal Medicine

## 2022-03-24 VITALS — BP 134/84 | HR 75 | Temp 98.2°F | Ht 62.0 in | Wt 182.0 lb

## 2022-03-24 DIAGNOSIS — J8283 Eosinophilic asthma: Secondary | ICD-10-CM | POA: Diagnosis not present

## 2022-03-24 DIAGNOSIS — J4551 Severe persistent asthma with (acute) exacerbation: Secondary | ICD-10-CM

## 2022-03-24 MED ORDER — BREZTRI AEROSPHERE 160-9-4.8 MCG/ACT IN AERO: 2.0000 | INHALATION_SPRAY | Freq: Two times a day (BID) | RESPIRATORY_TRACT | 0 refills | Status: DC

## 2022-03-24 MED ORDER — FLUCONAZOLE 50 MG PO TABS: 200.0000 mg | ORAL_TABLET | Freq: Every day | ORAL | 0 refills | Status: DC

## 2022-03-24 MED ORDER — SPIRONOLACTONE 25 MG PO TABS: 25.0000 mg | ORAL_TABLET | Freq: Every day | ORAL | 0 refills | Status: DC

## 2022-03-24 MED ORDER — SYMBICORT 80-4.5 MCG/ACT IN AERO: 2.0000 | INHALATION_SPRAY | Freq: Two times a day (BID) | RESPIRATORY_TRACT | 11 refills | Status: DC

## 2022-03-24 MED ORDER — PREDNISONE 10 MG PO TABS: ORAL_TABLET | ORAL | 0 refills | Status: DC

## 2022-03-24 NOTE — Telephone Encounter (Signed)
Pt is requesting a refill on: ?spironolactone (ALDACTONE) 25 MG tablet ? ?Pharmacy: ?[facility] [place], [place] ? ?LOV 03/06/22 ?ROV 03/28/22 ? ?

## 2022-03-24 NOTE — Telephone Encounter (Signed)
Called and spoke with pt who states she still is having problems with thrush in her mouth and states that she does rinse her mouth out after she uses her Symbicort inhaler. ? ?Pt also states that she has been wheezing and is also coughing. States mucus is clear when she coughs but states she does have green postnasal drainage. Stated to pt that we should get her scheduled for an OV and she verbalized understanding. Appt scheduled with Dr. Verlee Monte today 4/21 at 4pm. Nothing further needed. ?

## 2022-03-24 NOTE — Progress Notes (Signed)
? ?Synopsis: Referred for wheeze, thrush by Biagio Borg, MD ? ?Subjective:  ? ?PATIENT ID: Andrea Mason GENDER: female DOB: 1951/05/18, MRN: [id number] ? ?Chief Complaint  ?Patient presents with  ? Acute Visit  ?  Increased cough- non prod, green nasal d/c and wheezing for the past 2 wks. She feels more SOB than usual.   ? ?71yF with history of GERD, cough-variant asthma following with Dr. Melvyn Novas on symbicort 80/singulair/flonase/flutter, allergy/CRS, primary hyperaldo, stroke ? ?Several courses of prednisone this past year.  ? ?She says she's been taking clotrimazole troches for the last couple weeks for thrush. ? ?She is taking symbicort 2 puffs twice daily, rinsing mouth/gargling after use. She insists she has very strong steroid response.  ? ?Otherwise pertinent review of systems is negative. ? ?Past Medical History:  ?Diagnosis Date  ? Allergic rhinitis   ? Allergy   ? Asthma   ? Depression   ? DJD (degenerative joint disease), cervical   ? GERD (gastroesophageal reflux disease)   ? History of colonic polyps   ? Hypertension   ? Obesity   ? 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 Stroke [facility])   ? Syncope   ?  ? ?Family History  ?Problem Relation Age of Onset  ? Colon cancer Brother   ? Stroke Brother   ? Arrhythmia Brother   ? Cancer Mother   ? Diabetes Sister   ? Lung disease Son   ?     Sarcoid vs ARDS related fibrosis, required 2 double lung transplants  ? Congenital heart disease Brother   ? Heart attack Brother   ? Esophageal cancer Neg Hx   ? Rectal cancer Neg Hx   ? Stomach cancer Neg Hx   ?  ? ?Past Surgical History:  ?Procedure Laterality Date  ? COLONOSCOPY    ? FOOT SURGERY  1998  ? Implantable loop recorder placement  09/16/14  ? 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 LOOP RECORDER REMOVAL N/A 10/02/2017  ? Procedure: LOOP RECORDER REMOVAL;  Surgeon: [name], MD;  Location: [facility];  Service: Cardiovascular;  Laterality: N/A;  ? NASAL POLYP  SURGERY  07/2006  ? x 2 with Dr. [name]  ? NASAL SINUS SURGERY  07/2006  ? Dr. [name]  ? TUBAL LIGATION    ? ? ?Social History  ? ?Socioeconomic History  ? Marital status: Widowed  ?  Spouse name: Not on file  ? Number of children: 3  ? Years of education: Not on file  ? Highest education level: Not on file  ?Occupational History  ? Occupation: Publishing copy  ?  Comment: [employer]  ?Tobacco Use  ? Smoking status: Never  ? Smokeless tobacco: Never  ?Vaping Use  ? Vaping Use: Never used  ?Substance and Sexual Activity  ? Alcohol use: Yes  ?  Comment: socially wine  ? Drug use: No  ? Sexual activity: Not Currently  ?  Birth control/protection: Post-menopausal  ?Other Topics Concern  ? Not on file  ?Social History Narrative  ? Widowed, husband died in 03/03/2006  ? Works at [employer] as a Publishing copy  ? 1 children, 1 died from homicide and 1 child is deceased.  ? Patient lives alone.  ? ?Social Determinants of Health  ? ?Financial Resource Strain: Not on file  ?Food Insecurity: Not on file  ?Transportation Needs: Not on file  ?Physical Activity: Not on file  ?Stress: Not on file  ?  Social Connections: Not on file  ?Intimate Partner Violence: Not on file  ?  ? ?Allergies  ?Allergen Reactions  ? Ivp Dye [Iodinated Contrast Media] Nausea And Vomiting and Other (See Comments)  ?  hot flashes  ? Promethazine-Codeine Nausea And Vomiting  ? Tramadol Other (See Comments)  ?  Almost passed out  ?  ? ?Outpatient Medications Prior to Visit  ?Medication Sig Dispense Refill  ? acetaminophen (TYLENOL) 325 MG tablet Take as directed per bottle instructions    ? albuterol (VENTOLIN HFA) 108 (90 Base) MCG/ACT inhaler Inhale 1-2 puffs into the lungs every 6 (six) hours as needed for wheezing or shortness of breath.     ? aspirin 325 MG tablet Take 325 mg by mouth daily.    ? atorvastatin (LIPITOR) 10 MG tablet TAKE 1 TABLET(10 MG) BY MOUTH DAILY 90 tablet 3  ? baclofen (LIORESAL) 10 MG tablet Take 5 mg by mouth every 6 (six) hours  as needed for muscle spasms.    ? Biotin 10000 MCG TABS Take 10,000 mcg by mouth daily.    ? cetirizine (ZYRTEC) 10 MG tablet Take 10 mg by mouth at bedtime.     ? Cholecalciferol (VITAMIN D3) 2000 units TABS Take 2,000 Units by mouth daily.    ? clotrimazole (MYCELEX) 10 MG troche Take 1 tablet (10 mg total) by mouth in the morning, at noon, in the evening, and at bedtime. 30 tablet 4  ? darifenacin (ENABLEX) 7.5 MG 24 hr tablet Take 1 tablet (7.5 mg total) by mouth daily. 90 tablet 3  ? dextromethorphan-guaiFENesin (MUCINEX DM) 30-600 MG 12hr tablet Take 1-2 tablets by mouth 2 (two) times daily as needed (for congestion/cough (WITH FLUTTER VALVE)).     ? fluticasone (FLONASE) 50 MCG/ACT nasal spray Place 2 sprays into both nostrils daily. 16 g 5  ? lidocaine (LIDODERM) 5 % Place 1 patch onto the skin daily. Apply to painful area of chest, remove & discard patch every 12hrs allowing 12hr without patch present 30 patch 0  ? Magnesium 250 MG TABS Take 1 tablet by mouth daily.    ? montelukast (SINGULAIR) 10 MG tablet TAKE 1 TABLET(10 MG) BY MOUTH AT BEDTIME 30 tablet 11  ? oxymetazoline (AFRIN) 0.05 % nasal spray Place 2 sprays into both nostrils 2 (two) times daily as needed (nasal stuffiness and nasal pressure for 5 days).     ? Polyethylene Glycol 3350 (MIRALAX PO) 1 capful daily as needed    ? Respiratory Therapy Supplies (FLUTTER) DEVI Use as directed    ? sodium chloride (OCEAN) 0.65 % SOLN nasal spray Place 2 sprays into both nostrils as needed (dry nose).     ? solifenacin (VESICARE) 5 MG tablet Take 1 tablet (5 mg total) by mouth daily. 90 tablet 3  ? spironolactone (ALDACTONE) 25 MG tablet Take 1 tablet (25 mg total) by mouth daily. 90 tablet 0  ? SYMBICORT 80-4.5 MCG/ACT inhaler Inhale 2 puffs into the lungs 2 (two) times daily. 11 g 11  ? famotidine (PEPCID) 20 MG tablet Take 1 tablet (20 mg total) by mouth at bedtime. 90 tablet 3  ? pantoprazole (PROTONIX) 40 MG tablet TAKE 1 TABLET(40 MG) BY MOUTH DAILY  90 tablet 3  ? ?No facility-administered medications prior to visit.  ? ? ? ? ? ?Objective:  ? ?Physical Exam: ? ?General appearance: 71 y.o., female, NAD, conversant  ?Eyes: anicteric sclerae; PERRL, tracking appropriately ?HENT: NCAT; MMM ?Neck: Trachea midline; no lymphadenopathy, no JVD ?Lungs:  CTAB, no crackles, no wheeze, with normal respiratory effort ?CV: RRR, no murmur  ?Abdomen: Soft, non-tender; non-distended, BS present  ?Extremities: No peripheral edema, warm ?Skin: Normal turgor and texture; no rash ?Psych: Appropriate affect ?Neuro: Alert and oriented to person and place, no focal deficit  ? ? ? ?Vitals:  ? 03/24/22 1610  ?BP: 134/84  ?Pulse: 75  ?Temp: 98.2 ?F (36.8 ?C)  ?TempSrc: Oral  ?SpO2: 99%  ?Weight: 182 lb (82.6 kg)  ?Height: '5\' 2"'$  (1.575 m)  ? ?99% on RA ?BMI Readings from Last 3 Encounters:  ?03/24/22 33.29 kg/m?  ?03/06/22 33.11 kg/m?  ?12/30/21 32.37 kg/m?  ? ?Wt Readings from Last 3 Encounters:  ?03/24/22 182 lb (82.6 kg)  ?03/06/22 181 lb (82.1 kg)  ?12/30/21 177 lb (80.3 kg)  ? ? ? ?CBC ?   ?Component Value Date/Time  ? WBC 6.7 03/06/2022 1444  ? RBC 3.88 03/06/2022 1444  ? HGB 11.9 (L) 03/06/2022 1444  ? HCT 36.0 03/06/2022 1444  ? PLT 204.0 03/06/2022 1444  ? MCV 92.7 03/06/2022 1444  ? MCH 29.2 08/09/2020 1621  ? MCHC 33.2 03/06/2022 1444  ? RDW 12.8 03/06/2022 1444  ? LYMPHSABS 1.8 03/06/2022 1444  ? MONOABS 0.5 03/06/2022 1444  ? EOSABS 1.7 (H) 03/06/2022 1444  ? BASOSABS 0.1 03/06/2022 1444  ? ? ?Eos 1400-1700!, IgE 2000! ? ?Chest Imaging: ?None new ? ?Pulmonary Functions Testing Results: ?   ? View : No data to display.  ?  ?  ?  ? ? ? ?   ?Assessment & Plan:  ? ?# Severe persistent asthma in exacerbation ?# Eosinophilic asthma ?# CRS with history of nasal polyposis ? ?# Oropharyngeal candidiasis ? ?Plan: ?- check IgE, aspergillus specific IgE, and HRCT Chest to eval for possibility of ABPA ?- fluconazole 10 day course, stop troches ?- prednisone taper, hold symbicort for now  but can continue to use albuterol prn ?- trial breztri 2 puffs BID with spacer once thrush clears up. She just got 3 new symbicort inhalers covered through PAP so can just resume one of these once sampl

## 2022-03-24 NOTE — Patient Instructions (Addendum)
-   start prednisone taper, stop symbicort while on prednisone ?- start fluconazole 200 mg (4 tablets) once daily for 10 days for thrush ?- once your prednisone runs out start breztri 2 puffs twice daily with spacer - rinse mouth, brush teeth after use. Can resume symbicort once you run out of breztri ?- you will be called to schedule CT scan ?- see you in 4-6 weeks to talk about all of this! ?

## 2022-03-25 ENCOUNTER — Telehealth: Payer: Self-pay | Admitting: Internal Medicine

## 2022-03-25 NOTE — Telephone Encounter (Signed)
[facility] was closed. So, I Called [facility] and they saw the order in computer and will dispense it. Infoirmed patient  ? ? ? ?

## 2022-03-28 ENCOUNTER — Encounter: Payer: Self-pay | Admitting: Internal Medicine

## 2022-03-28 ENCOUNTER — Ambulatory Visit (INDEPENDENT_AMBULATORY_CARE_PROVIDER_SITE_OTHER): Payer: PPO | Admitting: Internal Medicine

## 2022-03-28 VITALS — BP 130/70 | HR 65 | Temp 98.0°F | Ht 62.0 in | Wt 185.0 lb

## 2022-03-28 DIAGNOSIS — R739 Hyperglycemia, unspecified: Secondary | ICD-10-CM

## 2022-03-28 DIAGNOSIS — I10 Essential (primary) hypertension: Secondary | ICD-10-CM | POA: Diagnosis not present

## 2022-03-28 DIAGNOSIS — E7849 Other hyperlipidemia: Secondary | ICD-10-CM | POA: Diagnosis not present

## 2022-03-28 DIAGNOSIS — N1831 Chronic kidney disease, stage 3a: Secondary | ICD-10-CM

## 2022-03-28 MED ORDER — AMLODIPINE BESYLATE 5 MG PO TABS: 5.0000 mg | ORAL_TABLET | Freq: Every day | ORAL | 3 refills | Status: DC

## 2022-03-28 NOTE — Progress Notes (Signed)
Patient ID: Andrea Mason, female   DOB: May 20, 1951, 71 y.o.   MRN: [id number] ? ? ? ?    Chief Complaint: follow up HTN ? ?     HPI:  Andrea Mason is a 71 y.o. female here to f/u, Pt denies chest pain, increased sob or doe, wheezing, orthopnea, PND, increased LE swelling, palpitations, dizziness or syncope.  Pt denies polydipsia, polyuria, or new focal neuro s/s.    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  Fearful of another stroke.  ?      ?Wt Readings from Last 3 Encounters:  ?03/28/22 185 lb (83.9 kg)  ?03/24/22 182 lb (82.6 kg)  ?03/06/22 181 lb (82.1 kg)  ? ?BP Readings from Last 3 Encounters:  ?03/28/22 130/70  ?03/24/22 134/84  ?03/23/22 (!) 126/98  ? ?      ?Past Medical History:  ?Diagnosis Date  ? Allergic rhinitis   ? Allergy   ? Asthma   ? Depression   ? DJD (degenerative joint disease), cervical   ? GERD (gastroesophageal reflux disease)   ? History of colonic polyps   ? Hypertension   ? Obesity   ? 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 Stroke [facility])   ? Syncope   ? ?Past Surgical History:  ?Procedure Laterality Date  ? COLONOSCOPY    ? FOOT SURGERY  1998  ? Implantable loop recorder placement  09/16/14  ? 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 LOOP RECORDER REMOVAL N/A 10/02/2017  ? Procedure: LOOP RECORDER REMOVAL;  Surgeon: [name], MD;  Location: [facility];  Service: Cardiovascular;  Laterality: N/A;  ? NASAL POLYP SURGERY  07/2006  ? x 2 with Dr. [name]  ? NASAL SINUS SURGERY  07/2006  ? Dr. [name]  ? TUBAL LIGATION    ? ? reports that she has never smoked. She has never used smokeless tobacco. She reports current alcohol use. She reports that she does not use drugs. ?family history includes Arrhythmia in her brother; Cancer in her  mother; Colon cancer in her brother; Congenital heart disease in her brother; Diabetes in her sister; Heart attack in her brother; Lung disease in her son; Stroke in her brother. ?Allergies  ?Allergen Reactions  ? Ivp Dye [Iodinated Contrast Media] Nausea And Vomiting and Other (See Comments)  ?  hot flashes  ? Promethazine-Codeine Nausea And Vomiting  ? Tramadol Other (See Comments)  ?  Almost passed out  ? ?Current Outpatient Medications on File Prior to Visit  ?Medication Sig Dispense Refill  ? acetaminophen (TYLENOL) 325 MG tablet Take as directed per bottle instructions    ? albuterol (VENTOLIN HFA) 108 (90 Base) MCG/ACT inhaler Inhale 1-2 puffs into the lungs every 6 (six) hours as needed for wheezing or shortness of breath.     ? aspirin 325 MG tablet Take 325 mg by mouth daily.    ? atorvastatin (LIPITOR) 10 MG tablet TAKE 1 TABLET(10 MG) BY MOUTH DAILY 90 tablet 3  ? baclofen (LIORESAL) 10 MG tablet Take 5 mg by mouth every 6 (six) hours as needed for muscle spasms.    ? Biotin 10000 MCG TABS Take 10,000 mcg by mouth daily.    ? Budeson-Glycopyrrol-Formoterol (BREZTRI AEROSPHERE) 160-9-4.8 MCG/ACT AERO Inhale 2  puffs into the lungs in the morning and at bedtime. 5.9 g 0  ? cetirizine (ZYRTEC) 10 MG tablet Take 10 mg by mouth at bedtime.     ? Cholecalciferol (VITAMIN D3) 2000 units TABS Take 2,000 Units by mouth daily.    ? clotrimazole (MYCELEX) 10 MG troche Take 1 tablet (10 mg total) by mouth in the morning, at noon, in the evening, and at bedtime. 30 tablet 4  ? darifenacin (ENABLEX) 7.5 MG 24 hr tablet Take 1 tablet (7.5 mg total) by mouth daily. 90 tablet 3  ? dextromethorphan-guaiFENesin (MUCINEX DM) 30-600 MG 12hr tablet Take 1-2 tablets by mouth 2 (two) times daily as needed (for congestion/cough (WITH FLUTTER VALVE)).     ? fluconazole (DIFLUCAN) 50 MG tablet Take 4 tablets (200 mg total) by mouth daily. 40 tablet 0  ? fluticasone (FLONASE) 50 MCG/ACT nasal spray Place 2 sprays into both nostrils  daily. 16 g 5  ? lidocaine (LIDODERM) 5 % Place 1 patch onto the skin daily. Apply to painful area of chest, remove & discard patch every 12hrs allowing 12hr without patch present 30 patch 0  ? Magnesium 250 MG TABS Take 1 tablet by mouth daily.    ? montelukast (SINGULAIR) 10 MG tablet TAKE 1 TABLET(10 MG) BY MOUTH AT BEDTIME 30 tablet 11  ? oxymetazoline (AFRIN) 0.05 % nasal spray Place 2 sprays into both nostrils 2 (two) times daily as needed (nasal stuffiness and nasal pressure for 5 days).     ? Polyethylene Glycol 3350 (MIRALAX PO) 1 capful daily as needed    ? predniSONE (DELTASONE) 10 MG tablet Take 4 tabs by mouth for 3 days, then 3 for 3 days, 2 for 3 days, 1 for 3 days and stop 30 tablet 0  ? Respiratory Therapy Supplies (FLUTTER) DEVI Use as directed    ? sodium chloride (OCEAN) 0.65 % SOLN nasal spray Place 2 sprays into both nostrils as needed (dry nose).     ? solifenacin (VESICARE) 5 MG tablet Take 1 tablet (5 mg total) by mouth daily. 90 tablet 3  ? spironolactone (ALDACTONE) 25 MG tablet Take 1 tablet (25 mg total) by mouth daily. 90 tablet 0  ? SYMBICORT 80-4.5 MCG/ACT inhaler Inhale 2 puffs into the lungs 2 (two) times daily. 11 g 11  ? ?No current facility-administered medications on file prior to visit.  ? ?     ROS:  All others reviewed and negative. ? ?Objective  ? ?     PE:  BP 130/70 (BP Location: Left Arm, Patient Position: Sitting, Cuff Size: Large)   Pulse 65   Temp 98 ?F (36.7 ?C) (Oral)   Ht '5\' 2"'$  (1.575 m)   Wt 185 lb (83.9 kg)   LMP  (LMP Unknown)   SpO2 99%   BMI 33.84 kg/m?  ? ?              Constitutional: Pt appears in NAD ?              HENT: Head: NCAT.  ?              Right Ear: External ear normal.   ?              Left Ear: External ear normal.  ?              Eyes: . Pupils are equal, round, and reactive to light. Conjunctivae and EOM are normal ?  Nose: without d/c or deformity ?              Neck: Neck supple. Gross normal ROM ?               Cardiovascular: Normal rate and regular rhythm.   ?              Pulmonary/Chest: Effort normal and breath sounds without rales or wheezing.  ?              Abd:  Soft, NT, ND, + BS, no organomegaly ?              Neurological: Pt is alert. At baseline orientation, motor grossly intact ?              Skin: Skin is warm. No rashes, no other new lesions, LE edema - none ?              Psychiatric: Pt behavior is normal without agitation  ? ?Micro: none ? ?Cardiac tracings I have personally interpreted today:  none ? ?Pertinent Radiological findings (summarize): none  ? ?Lab Results  ?Component Value Date  ? WBC 6.7 03/06/2022  ? HGB 11.9 (L) 03/06/2022  ? HCT 36.0 03/06/2022  ? PLT 204.0 03/06/2022  ? GLUCOSE 90 03/06/2022  ? CHOL 79 03/06/2022  ? TRIG 111.0 03/06/2022  ? HDL 47.00 03/06/2022  ? [place] 10 03/06/2022  ? ALT 21 03/06/2022  ? AST 25 03/06/2022  ? NA 137 03/06/2022  ? K 4.4 03/06/2022  ? CL 102 03/06/2022  ? CREATININE 1.03 03/06/2022  ? BUN 10 03/06/2022  ? CO2 30 03/06/2022  ? TSH 1.66 03/06/2022  ? INR 1.0 12/26/2019  ? HGBA1C 6.1 03/06/2022  ? ?Assessment/Plan:  ?Andrea Mason is a 71 y.o. Black or African American [2] female with  has a past medical history of Allergic rhinitis, Allergy, Asthma, Depression, DJD (degenerative joint disease), cervical, GERD (gastroesophageal reflux disease), History of colonic polyps, Hypertension, Obesity, Primary hyperaldosteronism ([place]) (11/06/2013), Stroke ([place]), and Syncope. ? ?Essential hypertension ?BP Readings from Last 3 Encounters:  ?03/28/22 130/70  ?03/24/22 134/84  ?03/23/22 (!) 126/98  ? ?Mild uncontrolled at home, pt to add amlodipine 5 mg qd,  to f/u any worsening symptoms or concerns ? ? ?Hyperglycemia ?Lab Results  ?Component Value Date  ? HGBA1C 6.1 03/06/2022  ? ?Stable, pt to continue current medical treatment  - diet ? ? ?Hyperlipidemia ?Lab Results  ?Component Value Date  ? [place] 10 03/06/2022  ? ?Stable, pt to continue current statin lipitor  10 ? ? ?CKD (chronic kidney disease) stage 3, GFR 30-59 ml/min (HCC) ?Lab Results  ?Component Value Date  ? CREATININE 1.03 03/06/2022  ? ?Stable overall, cont to avoid nephrotoxins f/u renal as planned ? ?Followup: Return i

## 2022-03-28 NOTE — Patient Instructions (Signed)
Please take all new medication as prescribed - the amlodipine 5 mg per day ? ?Please continue all other medications as before, and refills have been done if requested. ? ?Please have the pharmacy call with any other refills you may need. ? ?Please continue your efforts at being more active, low cholesterol diet, and weight control. ? ?Please keep your appointments with your specialists as you may have planned - Dr Joelyn Oms ?

## 2022-03-29 LAB — IGE: IgE (Immunoglobulin E), Serum: 1418 kU/L — ABNORMAL HIGH (ref <=114)

## 2022-03-31 ENCOUNTER — Ambulatory Visit
Admission: RE | Admit: 2022-03-31 | Discharge: 2022-03-31 | Disposition: A | Discharge status: Home / Self Care | Payer: PPO | Source: Ambulatory Visit | Attending: Student | Admitting: Student

## 2022-03-31 DIAGNOSIS — R918 Other nonspecific abnormal finding of lung field: Secondary | ICD-10-CM | POA: Diagnosis not present

## 2022-03-31 DIAGNOSIS — J8283 Eosinophilic asthma: Secondary | ICD-10-CM

## 2022-03-31 DIAGNOSIS — I700 Atherosclerosis of aorta: Secondary | ICD-10-CM | POA: Diagnosis not present

## 2022-03-31 DIAGNOSIS — I2510 Atherosclerotic heart disease of native coronary artery without angina pectoris: Secondary | ICD-10-CM | POA: Diagnosis not present

## 2022-03-31 DIAGNOSIS — J479 Bronchiectasis, uncomplicated: Secondary | ICD-10-CM | POA: Diagnosis not present

## 2022-03-31 LAB — ASPERGILLUS IGE PANEL
A. Amstel/Glaucu Class Interp: 0
A. Flavus Class Interp: 0
A. Fumigatus Class Interp: 2
A. Nidulans Class Interp: 0
A. Niger Class Interp: 0
A. Versicolor Class Interp: 0
Aspergillus amstel/glaucu IgE*: <0.35 kU/L (ref <0.35)
Aspergillus flavus IgE: <0.35 kU/L (ref <0.35)
Aspergillus fumigatus IgE: 1.95 kU/L — ABNORMAL HIGH (ref <0.35)
Aspergillus nidulans IgE: <0.35 kU/L (ref <0.35)
Aspergillus niger IgE: <0.1 kU/L (ref <0.35)
Aspergillus versicolor IgE: <0.1 kU/L (ref <0.35)

## 2022-04-01 ENCOUNTER — Encounter: Payer: Self-pay | Admitting: Internal Medicine

## 2022-04-01 NOTE — Assessment & Plan Note (Addendum)
Lab Results  ?Component Value Date  ? CREATININE 1.03 03/06/2022  ? ?Stable overall, cont to avoid nephrotoxins f/u renal as planned ? ?

## 2022-04-01 NOTE — Assessment & Plan Note (Signed)
Lab Results  ?Component Value Date  ? [place] 10 03/06/2022  ? ?Stable, pt to continue current statin lipitor 10 ? ?

## 2022-04-01 NOTE — Assessment & Plan Note (Signed)
BP Readings from Last 3 Encounters:  ?03/28/22 130/70  ?03/24/22 134/84  ?03/23/22 (!) 126/98  ? ?Mild uncontrolled at home, pt to add amlodipine 5 mg qd,  to f/u any worsening symptoms or concerns ? ?

## 2022-04-01 NOTE — Assessment & Plan Note (Signed)
Lab Results  ?Component Value Date  ? HGBA1C 6.1 03/06/2022  ? ?Stable, pt to continue current medical treatment  - diet ? ?

## 2022-04-10 ENCOUNTER — Telehealth: Payer: Self-pay | Admitting: Internal Medicine

## 2022-04-10 DIAGNOSIS — B4481 Allergic bronchopulmonary aspergillosis: Secondary | ICD-10-CM

## 2022-04-10 NOTE — Telephone Encounter (Signed)
Patient needs clarification on Breztri with her spacer. Please call back at [phone]. ? ? ?

## 2022-04-11 ENCOUNTER — Ambulatory Visit (INDEPENDENT_AMBULATORY_CARE_PROVIDER_SITE_OTHER): Payer: PPO | Admitting: Allergy & Immunology

## 2022-04-11 ENCOUNTER — Encounter: Payer: Self-pay | Admitting: Allergy & Immunology

## 2022-04-11 VITALS — BP 118/74 | HR 77 | Temp 97.7°F | Resp 16 | Ht 59.45 in | Wt 178.4 lb

## 2022-04-11 DIAGNOSIS — J4530 Mild persistent asthma, uncomplicated: Secondary | ICD-10-CM

## 2022-04-11 DIAGNOSIS — B4481 Allergic bronchopulmonary aspergillosis: Secondary | ICD-10-CM

## 2022-04-11 DIAGNOSIS — J301 Allergic rhinitis due to pollen: Secondary | ICD-10-CM

## 2022-04-11 DIAGNOSIS — B999 Unspecified infectious disease: Secondary | ICD-10-CM

## 2022-04-11 DIAGNOSIS — J310 Chronic rhinitis: Secondary | ICD-10-CM

## 2022-04-11 MED ORDER — PREDNISONE 10 MG PO TABS: ORAL_TABLET | ORAL | 0 refills | Status: DC

## 2022-04-11 MED ORDER — VORICONAZOLE 40 MG/ML PO SUSR: ORAL | 3 refills | Status: DC

## 2022-04-11 NOTE — Telephone Encounter (Signed)
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 Recommended starting steroid taper and voriconazole. Discussed risks/benefits of each. Labs in a week and then weekly as we start voriconazole - we will hold atorvastatin and solifenacin until I discuss with pharmacy. I said I'd reach out to Dr. Ernst Bowler to discuss timing/selection of biologic and to discuss initiating treatment of ABPA. ? ? ?

## 2022-04-11 NOTE — Patient Instructions (Addendum)
1. 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 ?- Nucala consent signed today. ?- [name] will be reaching out to talk to you about the approval process (nearly all Medicare patients receive the medication for free) ?- I will talk to pulmonology about how we are going to address this ABPA. ?- This is typically managed with steroids and antifungals but we will address that.  ?- Daily controller medication(s): Breztri 2 puffs twice daily ?- Prior to physical activity: albuterol 2 puffs 10-15 minutes before physical activity. ?- Rescue medications: albuterol 4 puffs every 4-6 hours as needed ?- Asthma control goals:  ?* Full participation in all desired activities (may need albuterol before activity) ?* Albuterol use two time or less a week on average (not counting use with activity) ?* Cough interfering with sleep two time or less a month ?* Oral steroids no more than once a year ?* No hospitalizations ? ?2. Chronic rhinitis - with nasal polyps ?- We are not going to start Nucala instead of Dupixent because Dupixent can cause an increase in your eosinophils. ?- Since you already have elevated eosinophils, I would rather not go down that road. ?- Testing today showed: grasses, ragweed, and trees ?- Copy of test results provided.  ?- Avoidance measures provided. ?- Continue with: Zyrtec (cetirizine) '10mg'$  tablet once daily ?- Start taking: Flonase (fluticasone) one spray per nostril daily (AIM FOR EAR ON EACH SIDE) ?- You can use an extra dose of the antihistamine, if needed, for breakthrough symptoms.  ?- Consider nasal saline rinses 1-2 times daily to remove allergens from the nasal cavities as well as help with mucous clearance (this is especially helpful to do before the nasal sprays are given) ? ?3. Recurrent infections ?- We will obtain some screening labs to evaluate your immune system.  ?- Labs to evaluate the quantitative [facility]) aspects of your immune system: IgG/IgA/IgM, CBC with differential ?- Labs to evaluate the qualitative (HOW WELL THEY WORK) aspects of your immune system: CH50, Pneumococcal titers, Tetanus titers, Diphtheria titers ?- We may consider immunizations with Pneumovax and Tdap to challenge your immune system, and then obtain repeat titers in 4-6 weeks.  ? ?4. Return in about 4 weeks (around 05/09/2022).  ? ? ?Please inform us of any Emergency Department visits, hospitalizations, or changes in symptoms. Call us before going to the ED for breathing or allergy symptoms since we might be able to fit you in for a sick visit. Feel free to contact us anytime with any questions, problems, or concerns. ? ?It was a pleasure to meet you today! You are such a hoot!  ? ?Websites that have reliable patient information: ?1. American Academy of Asthma, Allergy, and Immunology: www.aaaai.org ?2. Food Allergy Research and Education (FARE): foodallergy.org ?3. Mothers of Asthmatics: http://www.asthmacommunitynetwork.org ?4. [employer] of Allergy, Asthma, and Immunology: MonthlyElectricBill.co.uk ? ? ?COVID-19 Vaccine Information can be found at: ShippingScam.co.uk For questions related to vaccine distribution or appointments, please email [email or web address] or call [phone].  ? ?We realize that you might be concerned about having an allergic reaction to the COVID19 vaccines. To help with that concern, WE ARE OFFERING THE COVID19 VACCINES IN OUR OFFICE! Ask the front desk for dates!  ? ? ? ??Like? [place] on Facebook and Instagram for our latest updates!  ?  ? ? ?A healthy democracy works best when [employer] participate! Make sure you are registered to vote! If you  have moved or changed any of your contact information, you will need to get this updated before voting! ? ?In some cases, you MAY be able to register to vote online:  [email or web address] ? ? ? ? ? ? ? ? ? Airborne Adult Perc - 04/11/22 1504   ? ? Time Antigen Placed 1509   ? Allergen Manufacturer Lavella Hammock   ? Location Back   ? Number of Test 59   ? 1. Control-Buffer 50% Glycerol Negative   ? 2. Control-Histamine 1 mg/ml 2+   ? 3. Albumin saline Negative   ? 4. [place] Negative   ? 5. [place] Negative   ? 6. [place] Negative   ? 7. Pine [place] Blue Negative   ? 8. Meadow Fescue Negative   ? 9. Perennial Rye 2+   ? 10. Sweet Vernal Negative   ? 11. Timothy Negative   ? 12. Cocklebur Negative   ? 13. Burweed Marshelder Negative   ? 14. Ragweed, short 2+   ? 15. Ragweed, Giant Negative   ? 16. Plantain,  English Negative   ? 17. Lamb's Quarters Negative   ? 18. Sheep Sorrell Negative   ? 19. Rough Pigweed Negative   ? 20. Marsh Elder, Rough Negative   ? 21. Mugwort, Common Negative   ? 22. Ash mix Negative   ? 23. Wendee Copp mix Negative   ? 24. Beech American Negative   ? 25. Box, Elder Negative   ? 26. Cedar, red 2+   ? 27. Cottonwood, [place] Negative   ? 28. Elm mix Negative   ? 29. Hickory Negative   ? 30. Maple mix Negative   ? 31. Oak, [place] mix Negative   ? 32. Pecan Pollen Negative   ? 33. Pine mix Negative   ? 34. Sycamore Eastern Negative   ? 35. Walnut, Black Pollen Negative   ? 36. Alternaria alternata Negative   ? 32. Cladosporium Herbarum Negative   ? 38. Aspergillus mix Negative   ? 39. Penicillium mix Negative   ? 40. Bipolaris sorokiniana (Helminthosporium) Negative   ? 41. Drechslera spicifera (Curvularia) Negative   ? 42. Mucor plumbeus Negative   ? 43. Fusarium moniliforme Negative   ? 44. Aureobasidium pullulans (pullulara) Negative   ? 45. Rhizopus oryzae Negative   ? 46. Botrytis cinera Negative   ? 47. Epicoccum nigrum Negative   ? 48. Phoma betae Negative   ? 49. Candida Albicans Negative   ? 50. Trichophyton mentagrophytes Negative   ? 51. Mite, D Farinae  5,000 AU/ml Negative   ? 52. Mite, D Pteronyssinus  5,000 AU/ml Negative   ? 53. Cat  Hair 10,000 BAU/ml Negative   ? 54.  Dog Epithelia Negative   ? 55. Mixed Feathers Negative   ? 56. Horse Epithelia Negative   ? 57. Cockroach, [place] Negative   ? 58. Mouse Negative   ? 59. Tobacco Leaf Negative   ? ?  ?  ? ?  ? ? ?Reducing Pollen Exposure ? ?The American Academy of Allergy, Asthma and Immunology suggests the following steps to reduce your exposure to pollen during allergy seasons. ?   ?Do not hang sheets or clothing out to dry; pollen may collect on these items. ?Do not mow lawns or spend time around freshly cut grass; mowing stirs up pollen. ?Keep windows closed at night.  Keep car windows closed while driving. ?Minimize morning activities outdoors, a time when pollen counts are usually at their highest. ?Stay indoors as  much as possible when pollen counts or humidity is high and on windy days when pollen tends to remain in the air longer. ?Use air conditioning when possible.  Many air conditioners have filters that trap the pollen spores. ?Use a HEPA room air filter to remove pollen form the indoor air you breathe. ? ? ? ?

## 2022-04-11 NOTE — Progress Notes (Signed)
? ?NEW PATIENT ? ?Date of Service/Encounter:  04/11/22 ? ?Consult requested by: Biagio Borg, MD ? ? ?Assessment:  ? ?Mild persistent asthma, uncomplicated - initiating Nucala ? ?Seasonal allergic rhinitis due to pollen (grasses, ragweed, and trees) ? ?Bilateral nasal polyposis - initiating Nucala ? 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 ?Recurrent infections ? ?Plan/Recommendations:  ? ?1. 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 ?- Nucala consent signed today. ?- [name] will be reaching out to talk to you about the approval process (nearly all Medicare patients receive the medication for free) ?- I will talk to pulmonology about how we are going to address this ABPA. ?- This is typically managed with steroids and antifungals but we will address that.  ?- Daily controller medication(s): Breztri 2 puffs twice daily ?- Prior to physical activity: albuterol 2 puffs 10-15 minutes before physical activity. ?- Rescue medications: albuterol 4 puffs every 4-6 hours as needed ?- Asthma control goals:  ?* Full participation in all desired activities (may need albuterol before activity) ?* Albuterol use two time or less a week on average (not counting use with activity) ?* Cough interfering with sleep two time or less a month ?* Oral steroids no more than once a year ?* No hospitalizations ? ?2. Chronic rhinitis - with nasal polyps ?- We are not going to start Nucala instead of Dupixent because Dupixent can cause an increase in your eosinophils. ?- Since you already have elevated eosinophils, I would rather not go down that road. ?- Testing today showed: grasses, ragweed, and trees ?- Copy of test results provided.  ?- Avoidance measures provided. ?- Continue with: Zyrtec (cetirizine) '10mg'$  tablet once daily ?- Start taking: Flonase (fluticasone) one spray per  nostril daily (AIM FOR EAR ON EACH SIDE) ?- You can use an extra dose of the antihistamine, if needed, for breakthrough symptoms.  ?- Consider nasal saline rinses 1-2 times daily to remove allergens from the nasal cavities as well as help with mucous clearance (this is especially helpful to do before the nasal sprays are given) ? ?3. Recurrent infections ?- We will obtain some screening labs to evaluate your immune system.  ?- Labs to evaluate the quantitative [facility]) aspects of your immune system: IgG/IgA/IgM, CBC with differential ?- Labs to evaluate the qualitative (HOW WELL THEY WORK) aspects of your immune system: CH50, Pneumococcal titers, Tetanus titers, Diphtheria titers ?- We may consider immunizations with Pneumovax and Tdap to challenge your immune system, and then obtain repeat titers in 4-6 weeks.  ? ?4. Return in about 4 weeks (around 05/09/2022).  ? ? ?This note in its entirety was forwarded to the Provider who requested this consultation. ? ?Subjective:  ? ?Masey Scheiber is a 71 y.o. female presenting today for evaluation of  ?Chief Complaint  ?Patient presents with  ? Asthma  ? Allergy Testing  ?  Used to go to [facility] years ago and got allergy shots. ENT suggested that she come here for a "shot" for her polyps in her nose to prevent a third sinus surgery.  ? Other  ?  Patient thinks that she might have eczema but has not found the time to go to a dermatologist.  ? ? ?Kylia Grajales has a history of the following: ?Patient Active Problem List  ? Diagnosis Date Noted  ? ABPA (allergic bronchopulmonary aspergillosis) ([place]) 04/13/2022  ? Recurrent infections  04/13/2022  ? Anemia 03/06/2022  ? Thrush 04/12/2021  ? Urinary frequency 09/22/2020  ? Wrist pain, acute, left 09/09/2020  ? CKD (chronic kidney disease) stage 3, GFR 30-59 ml/min (HCC) 12/31/2019  ? Painful legs and moving toes of right foot 12/16/2019  ? Skin nodule 11/16/2019  ? Nocturia 11/16/2019  ? Low back pain 11/16/2019  ? Alopecia  01/31/2019  ? Upper airway cough syndrome 06/14/2018  ? Rash 05/30/2018  ? Vitamin D deficiency 08/31/2017  ? Facial contusion, subsequent encounter 03/12/2017  ? Vertigo 03/12/2017  ? Right rotator cuff tear 07/27/2016  ? Right shoulder pain 07/11/2016  ? Overactive bladder 01/19/2016  ? Cough variant asthma 09/07/2015  ? Burn 09/06/2015  ? Vocal cord dysfunction 04/05/2015  ? Asthma with exacerbation 04/05/2015  ? Left knee pain 03/12/2015  ? Right knee pain 03/12/2015  ? Cough 12/08/2014  ? Skin lumps, generalized 09/08/2014  ? Toe pain, right 08/06/2014  ? Pre-ulcerative corn or callous 08/06/2014  ? Hammertoe 08/06/2014  ? Swelling of joint, ankle, right 08/06/2014  ? Pansinusitis 06/02/2014  ? Cerebral thrombosis with cerebral infarction ([place]) 05/26/2014  ? Weakness 05/25/2014  ? Right arm pain 05/25/2014  ? Numbness in right leg 05/25/2014  ? Numbness and tingling of right arm 05/25/2014  ? Embolic stroke ([place]) 32/44/0102  ? 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 Alterations of sensations, late effect of cerebrovascular disease(438.6) 02/10/2014  ? Cerebral infarction ([place]) 11/25/2013  ? Hyperlipidemia 11/24/2013  ? Syncope 11/20/2013  ? 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 Back pain 11/06/2013  ? Hypokalemia 05/06/2013  ? Family history of colon cancer 05/06/2013  ? Lipoma 09/03/2012  ? Hyperglycemia 08/15/2012  ? Constipation 01/02/2012  ? Preventative health care 05/24/2011  ? Seasonal allergic rhinitis due to pollen 02/16/2011  ? OSTEOARTHRITIS, CERVICAL SPINE 02/09/2011  ? HYPERSOMNIA WITH SLEEP APNEA UNSPECIFIED 01/26/2011  ? UTI (urinary tract infection) 11/15/2010  ? NASAL POLYP 07/28/2010  ? FIBROIDS, UTERUS 07/27/2010  ? COMPUTERIZED TOMOGRAPHY, CHEST, ABNORMAL 09/16/2009  ? Seasonal and perennial allergic rhinitis 05/29/2009  ? PERIPHERAL EDEMA 05/28/2009  ? Mild persistent asthma, uncomplicated 72/53/6644  ? LEG PAIN, LEFT 06/30/2008  ? COLONIC POLYPS, HX OF  06/30/2008  ? OBESITY 12/12/2007  ? Depression 12/12/2007  ? Essential hypertension 12/12/2007  ? RHINOSINUSITIS, CHRONIC 12/12/2007  ? GERD 12/12/2007  ? ARTHRITIS 12/12/2007  ? ? ?History obtained from: chart review and patient. ? ?Tayen Narang was referred by Biagio Borg, MD.    ? ?Idolina is a 71 y.o. female presenting for an evaluation of asthma and allergies . She was told to come here for possible initiation of Dupixent.  ?  ?Asthma/Respiratory Symptom History: She has a history of severe asthma. She sees Dr. Melvyn Novas.  She has seen Dr. Melvyn Novas for a number of years. This was around 2013 or so. She is currently on the Breztri two puffs twice daily. She was previously on Symbicort.  She ended up getting a chest CT recently that demonstrated findings consistent with ABPA.  See read below. ? ?IMPRESSION: ?1. 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 ?2. Patulous esophagus, findings can be seen in setting of esophageal dysmotility. ?3. Aortic Atherosclerosis (ICD10-I70.0). ? ?Allergic Rhinitis Symptom History: She has a history of nasal polyps and sees Dr. [name]. She has had two surgeries in the past.  She  is interested in avoiding another surgery.  She does have some sneezing. She did not take antihistamines on Monday and Sunday night.  She has never been on allergy shots. ? ?She does report a history of recurrent infections.  She did antibiotics over a month or 2.  She has never had an immune work-up.  She has never been admitted to the hospital with any infections.  She has never needed IV antibiotics. ? ?Otherwise, there is no history of other atopic diseases, including drug allergies, stinging insect allergies, eczema, urticaria, or contact dermatitis. There is no significant infectious history. Vaccinations are up to date.  ? ? ?Past Medical  History: ?Patient Active Problem List  ? Diagnosis Date Noted  ? ABPA (allergic bronchopulmonary aspergillosis) ([place]) 04/13/2022  ? Recurrent infections 04/13/2022  ? Anemia 03/06/2022  ? Thrush 04/12/2021  ? Malachy Mood

## 2022-04-11 NOTE — Telephone Encounter (Signed)
Called and spoke with patient. She stated that she had a question about her Breztri inhaler and spacer but at the time of the call, [facility] was at [facility] and they offered to help her.  ? ?While on the phone, she stated that Dr. 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 She was upset that she had to hear the results from another provider instead of our office. I advised her that I would send a message to Dr. Verlee Monte in regards to the CT scan. She verbalized understanding.  ? ?Dr. Verlee Monte, can you please advise about the CT scan that was done on 03/31/22? Thanks!  ?

## 2022-04-12 NOTE — Telephone Encounter (Signed)
Nucala sounds good to me! She will be eager to taper off steroids ASAP so I'd be fine with starting as soon as she could be set up. Is this something your office would arrange? ?

## 2022-04-12 NOTE — Telephone Encounter (Signed)
Hi there!  I have not finished her note.  But I was actually thinking of trying mepolizumab.  Originally I had thought about Dupixent due to her coexisting nasal polyps, but with her elevated eosinophil count and the propensity for Dupixent to increase eosinophils, albeit transiently, after starting, I felt that mepolizumab might be the safer bet.  What you think?  I should wrap up my note tonight and I will send airway. ? ?Salvatore Marvel, MD ?[facility] ? ?

## 2022-04-13 ENCOUNTER — Encounter: Payer: Self-pay | Admitting: Allergy & Immunology

## 2022-04-13 ENCOUNTER — Telehealth: Payer: Self-pay | Admitting: *Deleted

## 2022-04-13 DIAGNOSIS — B4481 Allergic bronchopulmonary aspergillosis: Secondary | ICD-10-CM | POA: Insufficient documentation

## 2022-04-13 DIAGNOSIS — B999 Unspecified infectious disease: Secondary | ICD-10-CM | POA: Insufficient documentation

## 2022-04-13 NOTE — Telephone Encounter (Signed)
YES we will definitely take care of that. Thanks for all of your help with the ABPA! I gave her a general idea of ABPA, but figured you would take the lead on that.  ? ?Salvatore Marvel, MD ?[facility] ? ?

## 2022-04-13 NOTE — Telephone Encounter (Signed)
Spoke to patient and advised will mail app for patient assistance to get her on free drug for Nucala and submit once same is sent back to me. I will be in touch with patient in the next couple weeks to advise status ?

## 2022-04-13 NOTE — Telephone Encounter (Signed)
-----   Message from Valentina Shaggy, MD sent at 04/13/2022  8:23 AM EDT ----- ?START NUCALA. Let's just do for asthma. Might be easiest. Although she has had two polypectomies.  ?

## 2022-04-13 NOTE — Telephone Encounter (Signed)
Tried to call patient unable to l/m due to voicemail not set up ?

## 2022-04-14 NOTE — Telephone Encounter (Signed)
Thanks, [name]!   Zillah Alexie, MD [facility]  

## 2022-04-21 LAB — STREP PNEUMONIAE 23 SEROTYPES IGG
Pneumo Ab Type 1*: 7.1 ug/mL (ref >1.3)
Pneumo Ab Type 12 (12F)*: <0.1 ug/mL — ABNORMAL LOW (ref >1.3)
Pneumo Ab Type 14*: 5.7 ug/mL (ref >1.3)
Pneumo Ab Type 17 (17F)*: <0.1 ug/mL — ABNORMAL LOW (ref >1.3)
Pneumo Ab Type 19 (19F)*: 25.2 ug/mL (ref >1.3)
Pneumo Ab Type 2*: 3.1 ug/mL (ref >1.3)
Pneumo Ab Type 20*: 37 ug/mL (ref >1.3)
Pneumo Ab Type 22 (22F)*: 0.3 ug/mL — ABNORMAL LOW (ref >1.3)
Pneumo Ab Type 23 (23F)*: 1.1 ug/mL — ABNORMAL LOW (ref >1.3)
Pneumo Ab Type 26 (6B)*: 0.7 ug/mL — ABNORMAL LOW (ref >1.3)
Pneumo Ab Type 3*: 0.9 ug/mL — ABNORMAL LOW (ref >1.3)
Pneumo Ab Type 34 (10A)*: 0.5 ug/mL — ABNORMAL LOW (ref >1.3)
Pneumo Ab Type 4*: 0.3 ug/mL — ABNORMAL LOW (ref >1.3)
Pneumo Ab Type 43 (11A)*: 3.5 ug/mL (ref >1.3)
Pneumo Ab Type 5*: 0.4 ug/mL — ABNORMAL LOW (ref >1.3)
Pneumo Ab Type 51 (7F)*: 4.1 ug/mL (ref >1.3)
Pneumo Ab Type 54 (15B)*: 0.5 ug/mL — ABNORMAL LOW (ref >1.3)
Pneumo Ab Type 56 (18C)*: 0.9 ug/mL — ABNORMAL LOW (ref >1.3)
Pneumo Ab Type 57 (19A)*: >33.6 ug/mL (ref >1.3)
Pneumo Ab Type 68 (9V)*: 0.7 ug/mL — ABNORMAL LOW (ref >1.3)
Pneumo Ab Type 70 (33F)*: 7.4 ug/mL (ref >1.3)
Pneumo Ab Type 8*: 14.9 ug/mL (ref >1.3)
Pneumo Ab Type 9 (9N)*: 10.2 ug/mL (ref >1.3)

## 2022-04-21 LAB — CBC WITH DIFFERENTIAL
Basophils Absolute: 0.1 10*3/uL (ref 0.0–0.2)
Basos: 1 %
EOS (ABSOLUTE): 0.7 10*3/uL — ABNORMAL HIGH (ref 0.0–0.4)
Eos: 10 %
Hematocrit: 36.4 % (ref 34.0–46.6)
Hemoglobin: 13.2 g/dL (ref 11.1–15.9)
Immature Grans (Abs): 0 10*3/uL (ref 0.0–0.1)
Immature Granulocytes: 0 %
Lymphocytes Absolute: 2.1 10*3/uL (ref 0.7–3.1)
Lymphs: 32 %
MCH: 32.1 pg (ref 26.6–33.0)
MCHC: 36.3 g/dL — ABNORMAL HIGH (ref 31.5–35.7)
MCV: 89 fL (ref 79–97)
Monocytes Absolute: 0.4 10*3/uL (ref 0.1–0.9)
Monocytes: 6 %
Neutrophils Absolute: 3.4 10*3/uL (ref 1.4–7.0)
Neutrophils: 51 %
RBC: 4.11 x10E6/uL (ref 3.77–5.28)
RDW: 11.8 % (ref 11.7–15.4)
WBC: 6.6 10*3/uL (ref 3.4–10.8)

## 2022-04-21 LAB — DIPHTHERIA / TETANUS ANTIBODY PANEL
Diphtheria Ab: 2.62 IU/mL (ref <0.10)
Tetanus Ab, IgG: >7 IU/mL (ref <0.10)

## 2022-04-21 LAB — IGG, IGA, IGM
IgA/Immunoglobulin A, Serum: 455 mg/dL — ABNORMAL HIGH (ref 87–352)
IgG (Immunoglobin G), Serum: 1344 mg/dL (ref 586–1602)
IgM (Immunoglobulin M), Srm: 191 mg/dL (ref 26–217)

## 2022-04-21 LAB — COMPLEMENT, TOTAL: Compl, Total (CH50): >60 U/mL (ref >41)

## 2022-05-13 ENCOUNTER — Encounter (HOSPITAL_COMMUNITY): Payer: Self-pay | Admitting: Emergency Medicine

## 2022-05-13 ENCOUNTER — Emergency Department (HOSPITAL_COMMUNITY)
Admission: EM | Admit: 2022-05-13 | Discharge: 2022-05-14 | Disposition: A | Discharge status: Home / Self Care | Payer: PPO | Attending: Emergency Medicine | Admitting: Emergency Medicine

## 2022-05-13 ENCOUNTER — Other Ambulatory Visit: Payer: Self-pay

## 2022-05-13 ENCOUNTER — Emergency Department (HOSPITAL_COMMUNITY): Payer: PPO

## 2022-05-13 DIAGNOSIS — Z041 Encounter for examination and observation following transport accident: Secondary | ICD-10-CM | POA: Diagnosis not present

## 2022-05-13 DIAGNOSIS — I10 Essential (primary) hypertension: Secondary | ICD-10-CM | POA: Diagnosis not present

## 2022-05-13 DIAGNOSIS — J45909 Unspecified asthma, uncomplicated: Secondary | ICD-10-CM | POA: Insufficient documentation

## 2022-05-13 DIAGNOSIS — M25571 Pain in right ankle and joints of right foot: Secondary | ICD-10-CM | POA: Diagnosis not present

## 2022-05-13 DIAGNOSIS — Y92410 Unspecified street and highway as the place of occurrence of the external cause: Secondary | ICD-10-CM | POA: Diagnosis not present
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
## 2022-05-14 NOTE — Discharge Instructions (Signed)
You were evaluated in the Emergency Department and after careful evaluation, we did not find any emergent condition requiring admission or further testing in the hospital.  Your exam/testing today was overall reassuring.  X-ray did not show any broken bones or emergencies.  Recommend Tylenol every 4-6 hours as needed for pain.  Please return to the Emergency Department if you experience any worsening of your condition.  Thank you for allowing [place] to be a part of your care.

## 2022-05-14 NOTE — ED Notes (Signed)
Pt ambulatory to room with independent steady gait 

## 2022-05-21 NOTE — Progress Notes (Unsigned)
Synopsis: Referred for wheeze, thrush by Biagio Borg, MD  Subjective:   PATIENT ID: Andrea Mason GENDER: female DOB: 11/17/1951, MRN: [id number]  No chief complaint on file.  70yF with history of GERD, cough-variant asthma following with Dr. Melvyn Novas on symbicort 80/singulair/flonase/flutter, allergy/CRS, primary hyperaldo, stroke  Several courses of prednisone this past year.   She says she's been taking clotrimazole troches for the last couple weeks for thrush.  She is taking symbicort 2 puffs twice daily, rinsing mouth/gargling after use. She insists she has very strong steroid response.   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 Hasn't had any LFTs/vori level. Seen by allergy and working on approval for nucala  Otherwise pertinent review of systems is negative.  Past Medical History:  Diagnosis Date   Allergic rhinitis    Allergy    Asthma    Depression    DJD (degenerative joint disease), cervical    GERD (gastroesophageal reflux disease)    History of colonic polyps    Hypertension    Obesity    Primary hyperaldosteronism ([place]) 11/06/2013   Stroke ([place])    Syncope      Family History  Problem Relation Age of Onset   Asthma Mother    Allergic rhinitis Mother    Cancer Mother    Allergic rhinitis Father    Asthma Sister    Allergic rhinitis Sister    Diabetes Sister    Allergic rhinitis Brother    Colon cancer Brother    Stroke Brother    Arrhythmia Brother    Allergic rhinitis Brother    Congenital heart disease Brother    Heart attack Brother    Lung disease Son        Sarcoid vs ARDS related fibrosis, required 2 double lung transplants   Esophageal cancer Neg Hx    Rectal cancer Neg Hx    Stomach cancer Neg Hx      Past Surgical History:  Procedure Laterality Date   COLONOSCOPY     FOOT SURGERY  1998   Implantable loop recorder placement  09/16/14   MDT LINQ implanted by Dr Rayann Heman for cryptogenic stroke, RIO II  protocol   LOOP RECORDER REMOVAL N/A 10/02/2017   Procedure: LOOP RECORDER REMOVAL;  Surgeon: [name], MD;  Location: [facility];  Service: Cardiovascular;  Laterality: N/A;   NASAL POLYP SURGERY  07/2006   x 2 with Dr. [name]   NASAL SINUS SURGERY  07/2006   Dr. [name]   TUBAL LIGATION      Social History   Socioeconomic History   Marital status: Widowed    Spouse name: Not on file   Number of children: 3   Years of education: Not on file   Highest education level: Not on file  Occupational History   Occupation: Publishing copy    Comment: [employer]  Tobacco Use   Smoking status: Never    Passive exposure: Current (on sister's clothing bc sister is a smoker)   Smokeless tobacco: Never  Vaping Use   Vaping Use: Never used  Substance and Sexual Activity   Alcohol use: Yes    Comment: socially wine   Drug use: No   Sexual activity: Not Currently    Birth control/protection: Post-menopausal  Other Topics Concern   Not on file  Social History Narrative   Widowed, husband died in 17-Feb-2006   Works at [employer] as a Publishing copy   1 children, 1  died from homicide and 1 child is deceased.   Patient lives alone.   Social Determinants of Health   Financial Resource Strain: Not on file  Food Insecurity: Not on file  Transportation Needs: Not on file  Physical Activity: Not on file  Stress: Not on file  Social Connections: Not on file  Intimate Partner Violence: Not on file     Allergies  Allergen Reactions   Ivp Dye [Iodinated Contrast Media] Nausea And Vomiting and Other (See Comments)    hot flashes   Promethazine-Codeine Nausea And Vomiting   Tramadol Other (See Comments)    Almost passed out     Outpatient Medications Prior to Visit  Medication Sig Dispense Refill   acetaminophen (TYLENOL) 325 MG tablet Take as directed per bottle instructions     albuterol (VENTOLIN HFA) 108 (90 Base) MCG/ACT inhaler Inhale 1-2 puffs into the lungs every 6  (six) hours as needed for wheezing or shortness of breath.      amLODipine (NORVASC) 5 MG tablet Take 1 tablet (5 mg total) by mouth daily. 90 tablet 3   aspirin 325 MG tablet Take 325 mg by mouth daily.     atorvastatin (LIPITOR) 10 MG tablet TAKE 1 TABLET(10 MG) BY MOUTH DAILY 90 tablet 3   Biotin 10000 MCG TABS Take 10,000 mcg by mouth daily.     Budeson-Glycopyrrol-Formoterol (BREZTRI AEROSPHERE) 160-9-4.8 MCG/ACT AERO Inhale 2 puffs into the lungs in the morning and at bedtime. 5.9 g 0   cetirizine (ZYRTEC) 10 MG tablet Take 10 mg by mouth at bedtime.      Cholecalciferol (VITAMIN D3) 2000 units TABS Take 2,000 Units by mouth daily.     Cyanocobalamin (VITAMIN B-12 PO) Take by mouth daily.     darifenacin (ENABLEX) 7.5 MG 24 hr tablet Take 1 tablet (7.5 mg total) by mouth daily. 90 tablet 3   dextromethorphan-guaiFENesin (MUCINEX DM) 30-600 MG 12hr tablet Take 1-2 tablets by mouth 2 (two) times daily as needed (for congestion/cough (WITH FLUTTER VALVE)).      fluconazole (DIFLUCAN) 50 MG tablet Take 4 tablets (200 mg total) by mouth daily. 40 tablet 0   fluticasone (FLONASE) 50 MCG/ACT nasal spray Place 2 sprays into both nostrils daily. 16 g 5   Magnesium 250 MG TABS Take 1 tablet by mouth daily.     montelukast (SINGULAIR) 10 MG tablet TAKE 1 TABLET(10 MG) BY MOUTH AT BEDTIME 30 tablet 11   oxymetazoline (AFRIN) 0.05 % nasal spray Place 2 sprays into both nostrils 2 (two) times daily as needed (nasal stuffiness and nasal pressure for 5 days).      predniSONE (DELTASONE) 10 MG tablet Take 4 tabs by mouth for 3 days, then 3 for 3 days, 2 for 3 days, 1 for 3 days and stop (Patient not taking: Reported on 04/11/2022) 30 tablet 0   predniSONE (DELTASONE) 10 MG tablet 4 tablets daily for 2 weeks, then 3 tablets daily for 1 weeks, 2 tablets daily for 1 week, 1 tablet daily for 2 weeks 105 tablet 0   Respiratory Therapy Supplies (FLUTTER) DEVI Use as directed (Patient not taking: Reported on 04/11/2022)      sodium chloride (OCEAN) 0.65 % SOLN nasal spray Place 2 sprays into both nostrils as needed (dry nose).      solifenacin (VESICARE) 5 MG tablet Take 1 tablet (5 mg total) by mouth daily. (Patient not taking: Reported on 04/11/2022) 90 tablet 3   Spacer/Aero-Holding Chambers DEVI by Does not apply  route.     spironolactone (ALDACTONE) 25 MG tablet Take 1 tablet (25 mg total) by mouth daily. 90 tablet 0   SYMBICORT 80-4.5 MCG/ACT inhaler Inhale 2 puffs into the lungs 2 (two) times daily. 11 g 11   VITAMIN D PO Take by mouth daily.     voriconazole (VFEND) 40 MG/ML suspension 400 mg (10 mL) twice daily for 2 days then 200 mg (5 mL) twice daily 75 mL 3   No facility-administered medications prior to visit.       Objective:   Physical Exam:  General appearance: 71 y.o., female, NAD, conversant  Eyes: anicteric sclerae; PERRL, tracking appropriately HENT: NCAT; MMM Neck: Trachea midline; no lymphadenopathy, no JVD Lungs: CTAB, no crackles, no wheeze, with normal respiratory effort CV: RRR, no murmur  Abdomen: Soft, non-tender; non-distended, BS present  Extremities: No peripheral edema, warm Skin: Normal turgor and texture; no rash Psych: Appropriate affect Neuro: Alert and oriented to person and place, no focal deficit     There were no vitals filed for this visit.    on RA BMI Readings from Last 3 Encounters:  04/11/22 35.49 kg/m  03/28/22 33.84 kg/m  03/24/22 33.29 kg/m   Wt Readings from Last 3 Encounters:  04/11/22 178 lb 6.4 oz (80.9 kg)  03/28/22 185 lb (83.9 kg)  03/24/22 182 lb (82.6 kg)     CBC    Component Value Date/Time   WBC 6.6 04/11/2022 1628   WBC 6.7 03/06/2022 1444   RBC 4.11 04/11/2022 1628   RBC 3.88 03/06/2022 1444   HGB 13.2 04/11/2022 1628   HCT 36.4 04/11/2022 1628   PLT 204.0 03/06/2022 1444   MCV 89 04/11/2022 1628   MCH 32.1 04/11/2022 1628   MCH 29.2 08/09/2020 1621   MCHC 36.3 (H) 04/11/2022 1628   MCHC 33.2 03/06/2022 1444    RDW 11.8 04/11/2022 1628   LYMPHSABS 2.1 04/11/2022 1628   MONOABS 0.5 03/06/2022 1444   EOSABS 0.7 (H) 04/11/2022 1628   BASOSABS 0.1 04/11/2022 1628    Eos 1400-1700!, IgE 2000! 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 She just got 3 new symbicort inhalers covered through PAP so can just resume one of these once samples of breztri run out until she gets to our next appointment - agree with referral to allergy for eosinophilic asthma and nasal polyposis and consideration of biologic   RTC 4-6 weeks   Maryjane Hurter, MD [facility] 05/21/2022 7:22 AM

## 2022-05-23 ENCOUNTER — Ambulatory Visit
Admission: RE | Admit: 2022-05-23 | Discharge: 2022-05-23 | Disposition: A | Discharge status: Home / Self Care | Payer: PPO | Source: Ambulatory Visit | Attending: Student | Admitting: Student

## 2022-05-23 ENCOUNTER — Other Ambulatory Visit (INDEPENDENT_AMBULATORY_CARE_PROVIDER_SITE_OTHER): Payer: PPO

## 2022-05-23 ENCOUNTER — Encounter: Payer: Self-pay | Admitting: Student

## 2022-05-23 ENCOUNTER — Ambulatory Visit (INDEPENDENT_AMBULATORY_CARE_PROVIDER_SITE_OTHER): Payer: PPO | Admitting: Student

## 2022-05-23 ENCOUNTER — Other Ambulatory Visit: Payer: Self-pay | Admitting: Student

## 2022-05-23 VITALS — BP 126/74 | HR 78 | Temp 98.7°F | Ht 59.25 in | Wt 174.0 lb

## 2022-05-23 DIAGNOSIS — Z9851 Tubal ligation status: Secondary | ICD-10-CM | POA: Diagnosis not present

## 2022-05-23 DIAGNOSIS — R0609 Other forms of dyspnea: Secondary | ICD-10-CM | POA: Diagnosis not present

## 2022-05-23 DIAGNOSIS — M160 Bilateral primary osteoarthritis of hip: Secondary | ICD-10-CM | POA: Diagnosis not present

## 2022-05-23 DIAGNOSIS — B4481 Allergic bronchopulmonary aspergillosis: Secondary | ICD-10-CM

## 2022-05-23 DIAGNOSIS — M25751 Osteophyte, right hip: Secondary | ICD-10-CM | POA: Diagnosis not present

## 2022-05-23 LAB — COMPREHENSIVE METABOLIC PANEL
ALT: 14 U/L (ref 0–35)
AST: 17 U/L (ref 0–37)
Albumin: 3.8 g/dL (ref 3.5–5.2)
Alkaline Phosphatase: 73 U/L (ref 39–117)
BUN: 13 mg/dL (ref 6–23)
CO2: 29 mEq/L (ref 19–32)
Calcium: 9.3 mg/dL (ref 8.4–10.5)
Chloride: 102 mEq/L (ref 96–112)
Creatinine, Ser: 1.03 mg/dL (ref 0.40–1.20)
GFR: 55.53 mL/min — ABNORMAL LOW (ref >60.00)
Glucose, Bld: 78 mg/dL (ref 70–99)
Potassium: 4.3 mEq/L (ref 3.5–5.1)
Sodium: 135 mEq/L (ref 135–145)
Total Bilirubin: 0.5 mg/dL (ref 0.2–1.2)
Total Protein: 6.8 g/dL (ref 6.0–8.3)

## 2022-05-23 LAB — CBC WITH DIFFERENTIAL/PLATELET
Basophils Absolute: 0 10*3/uL (ref 0.0–0.1)
Basophils Relative: 0.7 % (ref 0.0–3.0)
Eosinophils Absolute: 0.1 10*3/uL (ref 0.0–0.7)
Eosinophils Relative: 3.1 % (ref 0.0–5.0)
HCT: 36.4 % (ref 36.0–46.0)
Hemoglobin: 12.2 g/dL (ref 12.0–15.0)
Lymphocytes Relative: 41 % (ref 12.0–46.0)
Lymphs Abs: 1.7 10*3/uL (ref 0.7–4.0)
MCHC: 33.5 g/dL (ref 30.0–36.0)
MCV: 92.9 fl (ref 78.0–100.0)
Monocytes Absolute: 0.4 10*3/uL (ref 0.1–1.0)
Monocytes Relative: 9 % (ref 3.0–12.0)
Neutro Abs: 1.9 10*3/uL (ref 1.4–7.7)
Neutrophils Relative %: 46.2 % (ref 43.0–77.0)
Platelets: 171 10*3/uL (ref 150.0–400.0)
RBC: 3.91 Mil/uL (ref 3.87–5.11)
RDW: 13.2 % (ref 11.5–15.5)
WBC: 4.1 10*3/uL (ref 4.0–10.5)

## 2022-05-23 MED ORDER — ALBUTEROL SULFATE HFA 108 (90 BASE) MCG/ACT IN AERS
1.0000 | INHALATION_SPRAY | Freq: Four times a day (QID) | RESPIRATORY_TRACT | 11 refills | Status: DC | PRN
Start: 2022-05-23 — End: 2024-02-05

## 2022-05-23 MED ORDER — POSACONAZOLE 100 MG PO TBEC
DELAYED_RELEASE_TABLET | ORAL | 3 refills | Status: DC
Start: 2022-05-23 — End: 2022-05-25

## 2022-05-23 MED ORDER — PREDNISONE 5 MG PO TABS: 10.0000 mg | ORAL_TABLET | Freq: Every day | ORAL | 2 refills | Status: DC

## 2022-05-23 NOTE — Patient Instructions (Addendum)
-   hip x ray when you can - symbicort 2 puffs twice daily rinse mouth after using - prednisone 5 to 10 mg daily at least until you start nucala - start posaconazole (antifungal) 300 mg twice daily tomorrow and then 300 mg daily for 4 months - albuterol as needed - see you in 3 months or sooner if need be!

## 2022-05-24 ENCOUNTER — Ambulatory Visit: Payer: PPO | Admitting: Internal Medicine

## 2022-05-24 ENCOUNTER — Telehealth: Payer: Self-pay | Admitting: Student

## 2022-05-24 DIAGNOSIS — B370 Candidal stomatitis: Secondary | ICD-10-CM

## 2022-05-24 LAB — IGE: IgE (Immunoglobulin E), Serum: 848 kU/L — ABNORMAL HIGH (ref <=114)

## 2022-05-25 ENCOUNTER — Telehealth: Payer: Self-pay | Admitting: *Deleted

## 2022-05-25 MED ORDER — POSACONAZOLE 100 MG PO TBEC: DELAYED_RELEASE_TABLET | ORAL | 3 refills | Status: DC

## 2022-05-25 NOTE — Telephone Encounter (Signed)
Patient called to check status of her PAP for Nucala. I had called 2 weeks ago and was still pending. Called back today and they went ahead and processed same. They have changed their policy so her Rx [facility] and they will reach out to deliver to patient and I will instruct her to make appt to bring in for injections or self admin instrux with 1st injeection

## 2022-05-25 NOTE — Telephone Encounter (Signed)
Called and informed patient that I resent her medication into her pharmacy. Patient verified pharmacy with me over the phone. Nothing further needed

## 2022-05-25 NOTE — Telephone Encounter (Signed)
Patient called requesting to speak to [name] asap.  Thanks

## 2022-05-30 ENCOUNTER — Telehealth: Payer: Self-pay | Admitting: Student

## 2022-05-30 NOTE — Telephone Encounter (Signed)
Patient advised of PAP approval and to reach out to [facility] at [phone] to schedule delivery

## 2022-05-31 ENCOUNTER — Other Ambulatory Visit (HOSPITAL_COMMUNITY): Payer: Self-pay

## 2022-05-31 ENCOUNTER — Telehealth: Payer: Self-pay

## 2022-05-31 NOTE — Telephone Encounter (Signed)
Patient Advocate Encounter   Received notification that prior authorization for Noxafil '100MG'$  dr tablets is required.   PA submitted on 05/31/2022 Key [id number]  Status is pending

## 2022-06-01 ENCOUNTER — Ambulatory Visit (INDEPENDENT_AMBULATORY_CARE_PROVIDER_SITE_OTHER): Payer: PPO | Admitting: Allergy & Immunology

## 2022-06-01 ENCOUNTER — Encounter: Payer: Self-pay | Admitting: Allergy & Immunology

## 2022-06-01 VITALS — BP 112/70 | HR 84 | Temp 97.9°F | Resp 24 | Wt 173.6 lb

## 2022-06-01 DIAGNOSIS — B4481 Allergic bronchopulmonary aspergillosis: Secondary | ICD-10-CM | POA: Diagnosis not present

## 2022-06-01 DIAGNOSIS — J4550 Severe persistent asthma, uncomplicated: Secondary | ICD-10-CM

## 2022-06-01 DIAGNOSIS — J3089 Other allergic rhinitis: Secondary | ICD-10-CM

## 2022-06-01 MED ORDER — MEPOLIZUMAB 100 MG ~~LOC~~ SOLR
100.0000 mg | SUBCUTANEOUS | Status: AC
  Administered 2022-06-01 – 2024-12-09 (×31): 100 mg via SUBCUTANEOUS

## 2022-06-01 NOTE — Telephone Encounter (Signed)
[facility] Patient Advocate Encounter  Received notification that the request for prior authorization for Noxafil '100MG'$  dr tablets has been denied due to patient not having invasive candidiasis (when yeast gets into your bloodstream and spreads to internal organs) they will cover Noxafil oral suspension 40 mg/ml (with prior authorization) for the FDA approved diagnosis for oropharyngeal candidiasis.  FDA approved indications for this use of this medication include: Aspergillosis, invasive candidiasis, Severely immunocompromised patients: Prophylaxis.

## 2022-06-01 NOTE — Patient Instructions (Addendum)
1. 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 - Sample of Nucala provided today to get you started.  - Hope this in combination with posiconazole will help you to get off of the prednisone.  - Daily controller medication(s): Symbicort 171mg 2 puffs twice daily + Nucala monthly - Prior to physical activity: albuterol 2 puffs 10-15 minutes before physical activity. - Rescue medications: albuterol 4 puffs every 4-6 hours as needed - Asthma control goals:  * Full participation in all desired activities (may need albuterol before activity) * Albuterol use two time or less a week on average (not counting use with activity) * Cough interfering with sleep two time or less a month * Oral steroids no more than once a year * No hospitalizations  2. Chronic rhinitis - with nasal polyps - Previous testing showed: grasses, ragweed, and trees - Continue with: Zyrtec (cetirizine) '10mg'$  tablet once daily - Continue with: Flonase (fluticasone) one spray per nostril daily (AIM FOR EAR ON EACH SIDE) - You can use an extra dose of the antihistamine, if needed, for breakthrough symptoms.  - Consider nasal saline rinses 1-2 times daily to remove allergens from the nasal cavities as well as help with mucous clearance (this is especially helpful to do before the nasal sprays are given)  3. Recurrent infections - We are going to hold off on further workup now.  - Previous workup was largely normal.   4. Return in about 3 months (around 09/01/2022).    Please inform [place] any Emergency Department visits, hospitalizations, or changes in symptoms. Call [place]efore going to the ED for breathing or allergy symptoms since we might be able to fit you in for a sick visit. Feel free to contact u[place]anytime with any questions, problems, or concerns.  It was a pleasure to meet you today! You are such a hoot!   Websites that have reliable patient information: 1. American Academy  of Asthma, Allergy, and Immunology: www.aaaai.org 2. Food Allergy Research and Education (FARE): foodallergy.org 3. Mothers of Asthmatics: http://www.asthmacommunitynetwork.org 4. American College of Allergy, Asthma, and Immunology: www.acaai.org   COVID-19 Vaccine Information can be found at: [email or web address] questions related to vaccine distribution or appointments, please email [email or web address] or call [phone]   We realize that you might be concerned about having an allergic reaction to the COVID19 vaccines. To help with that concern, WE ARE OFFERING THE COVID19 VACCINES IN OUR OFFICE! Ask the front desk for dates!     "Like" [facility] Facebook and Instagram for our latest updates!      A healthy democracy works best when ANew [employer] Life Insuranceparticipate! Make sure you are registered to vote! If you have moved or changed any of your contact information, you will need to get this updated before voting!  In some cases, you MAY be able to register to vote online: [email or web address]

## 2022-06-01 NOTE — Progress Notes (Signed)
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 - Sample of Nucala provided today to get you started.  - Hope this in combination with posiconazole will help you to get off of the prednisone.  - Daily controller medication(s): Symbicort 141mg 2 puffs twice daily + Nucala monthly - Prior to physical activity: albuterol 2 puffs 10-15 minutes before physical activity. - Rescue medications: albuterol 4 puffs every 4-6 hours as needed - Asthma control goals:  * Full participation in all desired activities (may need albuterol before activity) * Albuterol use two time or less a week on average (not counting use with activity) * Cough interfering with sleep two time or less a month * Oral steroids no more than once a year * No hospitalizations  2. Chronic rhinitis - with nasal polyps - Previous testing showed: grasses, ragweed, and trees - Continue with: Zyrtec (cetirizine) '10mg'$  tablet once daily - Continue with: Flonase (fluticasone) one spray per nostril daily (AIM FOR EAR ON EACH SIDE) - You can use an extra dose of the antihistamine, if needed, for breakthrough symptoms.  - Consider nasal saline rinses 1-2 times daily to remove allergens from the nasal cavities as well as help with mucous clearance (this is especially helpful to do before the nasal sprays are given)  3. Recurrent infections - We are going to hold off on further workup now.  - Previous workup was  largely normal.   4. Return in about 3 months (around 09/01/2022).    Subjective:   Andrea Guimaraesis a 71y.o. female presenting today for follow up of  Chief Complaint  Patient presents with   Asthma    Asthma flare last week: couldn't breathe on one of the hot days, used emergency inhaler and still struggled to breathe   Allergic Rhinitis     Been doing better now that she is avoiding eating peanuts. Everything is ok but here nose runs a little bit.    Andrea Churnhas a history of the following: Patient Active Problem List   Diagnosis Date Noted   ABPA ([place]/13/[place]   Recurrent infections 04/13/2022   Anemia 03/06/2022   Thrush 04/12/2021   Urinary frequency 09/22/2020   Wrist pain, acute, left 09/09/2020   CKD (chronic kidney disease) stage 3, GFR 30-59 ml/min (HCC) 12/31/2019   Painful legs and moving toes of right foot 12/16/2019   Skin nodule 11/16/2019   Nocturia 11/16/2019   Low back pain 11/16/2019   Alopecia 01/31/2019   Upper airway cough syndrome 06/14/2018   Rash 05/30/2018   Vitamin D deficiency 08/31/2017   Facial contusion, subsequent encounter 03/12/2017   Vertigo 03/12/2017   Right rotator cuff tear 07/27/2016   Right shoulder pain 07/11/2016   Overactive bladder 01/19/2016   Cough variant asthma 09/07/2015   Burn 09/06/2015   Vocal cord dysfunction 04/05/2015   Asthma with exacerbation 04/05/2015   Left knee pain 03/12/2015   Right knee pain 03/12/2015   Cough 12/08/2014   Skin lumps, generalized 09/08/2014  Toe pain, right 08/06/2014   Pre-ulcerative corn or callous 08/06/2014   Hammertoe 08/06/2014   Swelling of joint, ankle, right 08/06/2014   Pansinusitis 06/02/2014   Cerebral thrombosis with cerebral infarction ([place]) 05/26/2014   Weakness 05/25/2014   Right arm pain 05/25/2014   Numbness in right leg 05/25/2014   Numbness and tingling of right arm 15/17/6160   Embolic stroke ([place])  73/71/0626   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   Alterations of sensations, late effect of cerebrovascular disease(438.6) 02/10/2014   Cerebral infarction ([place]) 11/25/2013   Hyperlipidemia 11/24/2013   Syncope 11/20/2013   Primary hyperaldosteronism ([place]) 11/06/2013   Back pain 11/06/2013   Hypokalemia 05/06/2013   Family history of colon cancer 05/06/2013   Lipoma 09/03/2012   Hyperglycemia 08/15/2012   Constipation 01/02/2012   Preventative health care 05/24/2011   Seasonal allergic rhinitis due to pollen 02/16/2011   OSTEOARTHRITIS, CERVICAL SPINE 02/09/2011   HYPERSOMNIA WITH SLEEP APNEA UNSPECIFIED 01/26/2011   UTI (urinary tract infection) 11/15/2010   NASAL POLYP 07/28/2010   FIBROIDS, UTERUS 07/27/2010   COMPUTERIZED TOMOGRAPHY, CHEST, ABNORMAL 09/16/2009   Seasonal and perennial allergic rhinitis 05/29/2009   PERIPHERAL EDEMA 05/28/2009   Mild persistent asthma, uncomplicated 94/85/4627   LEG PAIN, LEFT 06/30/2008   COLONIC POLYPS, HX OF 06/30/2008   OBESITY 12/12/2007   Depression 12/12/2007   Essential hypertension 12/12/2007   RHINOSINUSITIS, CHRONIC 12/12/2007   GERD 12/12/2007   ARTHRITIS 12/12/2007    History obtained from: chart review and patient.  Andrea Mason is a 71 y.o. female presenting for a follow up visit.  She was last seen in May 2023.  At that time, she had moderate persistent asthma which was complicated by ABPA. She was already followed by Dr. Duane Boston in Pulmonology.  He is managing her ABPA with prednisone and posaconazole. Unfortunately, it seems the posaconazole has not been approved yet.  We continued with Breztri 2 puffs twice daily.  For her rhinitis, she had testing that was positive to grasses, ragweed, and trees.  We continued the Zyrtec and started Flonase.  We did an evaluation of her immune system due to a history of recurrent infections.  Since last visit, she has largely done well.  Asthma/Respiratory Symptom  History: She  is back on the prendisone. They are having trouble getting the antifungal approved. Now she is on Symbicort two puffs twice daily. This is free to her.  She has not been using her rescue inhaler much at all.  She feels amazing on the prednisone, but she realizes this is not a long-term solution.  Allergic Rhinitis Symptom History: She remains on her Flonase 1 spray per nostril twice a day.  She feels like this has been helping.  She is also on her antihistamine.  Food Allergy Symptom History: She apparently stopped eating peanuts and she felt better. She thought that since she was allergic to trees, she should avoid peanuts. She has felt better with avoidance of peanuts.   Otherwise, there have been no changes to her past medical history, surgical history, family history, or social history.    Review of Systems  Constitutional: Negative.  Negative for chills, fever, malaise/fatigue and weight loss.  HENT:  Positive for congestion. Negative for ear discharge, ear pain and sinus pain.   Eyes:  Negative for pain, discharge and redness.  Respiratory:  Positive for cough. Negative for sputum production, shortness of breath and wheezing.   Cardiovascular: Negative.  Negative for chest pain and palpitations.  Gastrointestinal:  Negative for abdominal pain, constipation, diarrhea, heartburn, nausea and vomiting.  Skin: Negative.  Negative for itching and rash.  Neurological:  Negative for dizziness and headaches.  Endo/Heme/Allergies:  Negative for environmental allergies. Does not bruise/bleed easily.       Objective:   Blood pressure 112/70, pulse 84, temperature 97.9 F (36.6 C), temperature source Temporal, resp. rate (!) 24, weight 173 lb 9.6 oz (78.7 kg), SpO2 98 %. Body mass index is 34.77 kg/m.    Physical Exam Vitals reviewed.  Constitutional:      Appearance: She is well-developed.  HENT:     Head: Normocephalic and atraumatic.     Right Ear: Tympanic membrane,  ear canal and external ear normal. No drainage, swelling or tenderness. Tympanic membrane is not injected, scarred, erythematous, retracted or bulging.     Left Ear: Tympanic membrane, ear canal and external ear normal. No drainage, swelling or tenderness. Tympanic membrane is not injected, scarred, erythematous, retracted or bulging.     Nose: No nasal deformity, septal deviation, mucosal edema or rhinorrhea.     Right Turbinates: Enlarged, swollen and pale.     Left Turbinates: Enlarged, swollen and pale.     Right Sinus: No maxillary sinus tenderness or frontal sinus tenderness.     Left Sinus: No maxillary sinus tenderness or frontal sinus tenderness.     Comments: She does have nasal polyps obstructing around 50% of the bilateral nasal cavities. They might be a bit smaller this visit following regular use of her Flonase.     Mouth/Throat:     Mouth: Mucous membranes are not pale and not dry.     Pharynx: Uvula midline.  Eyes:     General: Allergic shiner present.        Right eye: No discharge.        Left eye: No discharge.     Conjunctiva/sclera: Conjunctivae normal.     Right eye: Right conjunctiva is not injected. No chemosis.    Left eye: Left conjunctiva is not injected. No chemosis.    Pupils: Pupils are equal, round, and reactive to light.  Cardiovascular:     Rate and Rhythm: Normal rate and regular rhythm.     Heart sounds: Normal heart sounds.  Pulmonary:     Effort: Pulmonary effort is normal. No tachypnea, accessory muscle usage or respiratory distress.     Breath sounds: Normal breath sounds. No wheezing, rhonchi or rales.     Comments: Coarse upper airway sounds throughout.  Chest:     Chest wall: No tenderness.  Abdominal:     Tenderness: There is no abdominal tenderness. There is no guarding or rebound.  Lymphadenopathy:     Head:     Right side of head: No submandibular, tonsillar or occipital adenopathy.     Left side of head: No submandibular, tonsillar or  occipital adenopathy.     Cervical: No cervical adenopathy.  Skin:    General: Skin is warm.     Capillary Refill: Capillary refill takes less than 2 seconds.     Coloration: Skin is not pale.     Findings: No abrasion, erythema, petechiae or rash. Rash is not papular, urticarial or vesicular.  Neurological:     Mental Status: She is alert.  Psychiatric:        Behavior: Behavior is cooperative.      Diagnostic studies:    Spirometry: results normal (FEV1: 1.29/90%, FVC: 1.54/82%, FEV1/FVC: 82%).    Spirometry consistent with  normal pattern. This is much better than previous spirometric findings.   Allergy Studies: none        Salvatore Marvel, MD  [facility]

## 2022-06-02 ENCOUNTER — Encounter: Payer: Self-pay | Admitting: Allergy & Immunology

## 2022-06-02 NOTE — Telephone Encounter (Signed)
Can we resubmit under diagnosis of aspergillosis? She has allergic bronchopulmonary aspergillosis

## 2022-06-02 NOTE — Telephone Encounter (Signed)
Please advise on if a PA has been started for this patient or if one can be done for her Noxafil

## 2022-06-05 NOTE — Telephone Encounter (Signed)
Please refer to PA encounter from 6/28.

## 2022-06-13 ENCOUNTER — Other Ambulatory Visit: Payer: Self-pay | Admitting: *Deleted

## 2022-06-13 DIAGNOSIS — J45991 Cough variant asthma: Secondary | ICD-10-CM

## 2022-06-13 MED ORDER — MONTELUKAST SODIUM 10 MG PO TABS: 10.0000 mg | ORAL_TABLET | Freq: Every day | ORAL | 11 refills | Status: DC

## 2022-06-16 ENCOUNTER — Other Ambulatory Visit (HOSPITAL_COMMUNITY): Payer: Self-pay

## 2022-06-26 ENCOUNTER — Telehealth: Payer: Self-pay | Admitting: Student

## 2022-06-26 ENCOUNTER — Other Ambulatory Visit (HOSPITAL_COMMUNITY): Payer: Self-pay

## 2022-06-26 NOTE — Telephone Encounter (Signed)
Called pt and there was no answer-LMTCB °

## 2022-06-26 NOTE — Telephone Encounter (Signed)
I called and spoke with the pt  She received her first dose of Nucala approx 3 wks ago  Tolerating well  Has remained on pred 10 mg ever since last visit with Dr Verlee Monte on 05/23/22  She is asking if and when she can taper off  She also asked about whether or not Noxafil has been approved  According to note from pharmacy team, it's been denied  She wonders if Vfend which was previously prescribed would be covered  Dr Verlee Monte, can you please advise on prednisone and next step on antifungal? Thanks!

## 2022-06-27 NOTE — Telephone Encounter (Signed)
Called and discussed with her - she's doing better, came off steroids. I think risks, cost, and nuisance of weekly labs for 1st month for vfend potentially outweigh benefit unless she has trouble staying off of steroids. I'll send message to Dr. Ernst Bowler to see if he feels differently. I'd probably reserve it as above in case we have difficulty with ABPA/asthma exacerbations or maintaining off steroids in general. OK for her to discontinue prednisone as she had been on 5 mg daily for at least a week.  Andrea Mason

## 2022-06-29 NOTE — Telephone Encounter (Signed)
I am fine with that. Let's see how she does. Thanks, Dr. Verlee Monte!   Salvatore Marvel, MD [facility]

## 2022-07-01 ENCOUNTER — Other Ambulatory Visit: Payer: Self-pay

## 2022-07-01 ENCOUNTER — Emergency Department (HOSPITAL_COMMUNITY)
Admission: EM | Admit: 2022-07-01 | Discharge: 2022-07-01 | Disposition: A | Discharge status: Home / Self Care | Payer: PPO | Attending: Emergency Medicine | Admitting: Emergency Medicine

## 2022-07-01 DIAGNOSIS — Z20822 Contact with and (suspected) exposure to covid-19: Secondary | ICD-10-CM | POA: Diagnosis not present

## 2022-07-01 DIAGNOSIS — J069 Acute upper respiratory infection, unspecified: Secondary | ICD-10-CM | POA: Diagnosis not present

## 2022-07-01 DIAGNOSIS — I10 Essential (primary) hypertension: Secondary | ICD-10-CM | POA: Insufficient documentation

## 2022-07-01 DIAGNOSIS — Z79899 Other long term (current) drug therapy: Secondary | ICD-10-CM | POA: Insufficient documentation

## 2022-07-01 DIAGNOSIS — Z7982 Long term (current) use of aspirin: Secondary | ICD-10-CM | POA: Diagnosis not present

## 2022-07-01 DIAGNOSIS — R52 Pain, unspecified: Secondary | ICD-10-CM | POA: Diagnosis present

## 2022-07-01 LAB — CBC WITH DIFFERENTIAL/PLATELET
Abs Immature Granulocytes: 0.01 10*3/uL (ref 0.00–0.07)
Basophils Absolute: 0 10*3/uL (ref 0.0–0.1)
Basophils Relative: 1 %
Eosinophils Absolute: 0.1 10*3/uL (ref 0.0–0.5)
Eosinophils Relative: 1 %
HCT: 36 % (ref 36.0–46.0)
Hemoglobin: 12 g/dL (ref 12.0–15.0)
Immature Granulocytes: 0 %
Lymphocytes Relative: 40 %
Lymphs Abs: 2.3 10*3/uL (ref 0.7–4.0)
MCH: 31.7 pg (ref 26.0–34.0)
MCHC: 33.3 g/dL (ref 30.0–36.0)
MCV: 95.2 fL (ref 80.0–100.0)
Monocytes Absolute: 0.5 10*3/uL (ref 0.1–1.0)
Monocytes Relative: 8 %
Neutro Abs: 2.9 10*3/uL (ref 1.7–7.7)
Neutrophils Relative %: 50 %
Platelets: 165 10*3/uL (ref 150–400)
RBC: 3.78 MIL/uL — ABNORMAL LOW (ref 3.87–5.11)
RDW: 13.2 % (ref 11.5–15.5)
WBC: 5.8 10*3/uL (ref 4.0–10.5)
nRBC: 0 % (ref 0.0–0.2)

## 2022-07-01 LAB — URINALYSIS, ROUTINE W REFLEX MICROSCOPIC
Bilirubin Urine: NEGATIVE
Glucose, UA: NEGATIVE mg/dL
Hgb urine dipstick: NEGATIVE
Ketones, ur: NEGATIVE mg/dL
Leukocytes,Ua: NEGATIVE
Nitrite: NEGATIVE
Protein, ur: NEGATIVE mg/dL
Specific Gravity, Urine: 1.011 (ref 1.005–1.030)
pH: 8 (ref 5.0–8.0)

## 2022-07-01 LAB — BASIC METABOLIC PANEL
Anion gap: 10 (ref 5–15)
BUN: 12 mg/dL (ref 8–23)
CO2: 24 mmol/L (ref 22–32)
Calcium: 9.5 mg/dL (ref 8.9–10.3)
Chloride: 104 mmol/L (ref 98–111)
Creatinine, Ser: 1.34 mg/dL — ABNORMAL HIGH (ref 0.44–1.00)
GFR, Estimated: 43 mL/min — ABNORMAL LOW (ref >60)
Glucose, Bld: 93 mg/dL (ref 70–99)
Potassium: 3.8 mmol/L (ref 3.5–5.1)
Sodium: 138 mmol/L (ref 135–145)

## 2022-07-01 LAB — SARS CORONAVIRUS 2 BY RT PCR: SARS Coronavirus 2 by RT PCR: NEGATIVE

## 2022-07-01 MED ORDER — ACETAMINOPHEN 325 MG PO TABS
650.0000 mg | ORAL_TABLET | Freq: Once | ORAL | Status: AC
Start: 2022-07-01 — End: 2022-07-01
  Administered 2022-07-01: 650 mg via ORAL
  Filled 2022-07-01: qty 2

## 2022-07-01 NOTE — ED Triage Notes (Signed)
Patient reports generalized body aches with chills/fatigue this evening .

## 2022-07-01 NOTE — ED Provider Notes (Signed)
[facility] EMERGENCY DEPARTMENT Provider Note   CSN: [id number] Arrival date & time: 07/01/22  0535     History  Chief Complaint  Patient presents with   Body Aches / Chills    Andrea Mason is a 71 y.o. female.  Patient here with body aches and chills that started overnight.  History of hypertension, acid reflux, stroke.  She has aches throughout her body.  Did not check her temperature at home.  Has not had any nausea, vomiting, chest pain, shortness of breath, abdominal pain.  No sick contacts.  No cough or sputum production.  No headache or neck pain.  No weakness or numbness.  She tried some over-the-counter medication that did not seem to help earlier in the night.  She was having a hard time sleeping.  Nothing makes it worse or better.        Home Medications Prior to Admission medications   Medication Sig Start Date End Date Taking? Authorizing Provider  acetaminophen (TYLENOL) 325 MG tablet Take as directed per bottle instructions    [provider]  albuterol (VENTOLIN HFA) 108 (90 Base) MCG/ACT inhaler Inhale 1-2 puffs into the lungs every 6 (six) hours as needed for wheezing or shortness of breath. 05/23/22   Maryjane Hurter, MD  amLODipine (NORVASC) 5 MG tablet Take 1 tablet (5 mg total) by mouth daily. 03/28/22 03/28/23  Biagio Borg, MD  aspirin 325 MG tablet Take 325 mg by mouth daily.    [provider]  Biotin 10000 MCG TABS Take 10,000 mcg by mouth daily.    [provider]  cetirizine (ZYRTEC) 10 MG tablet Take 10 mg by mouth at bedtime.     [provider]  Cholecalciferol (VITAMIN D3) 2000 units TABS Take 2,000 Units by mouth daily.    [provider]  Cyanocobalamin (VITAMIN B-12 PO) Take by mouth daily.    [provider]  dextromethorphan-guaiFENesin (MUCINEX DM) 30-600 MG 12hr tablet Take 1-2 tablets by mouth 2 (two) times daily as needed (for congestion/cough (WITH FLUTTER VALVE)).      [provider]  fluticasone (FLONASE) 50 MCG/ACT nasal spray Place 2 sprays into both nostrils daily. 06/10/21   Tanda Rockers, MD  Magnesium 250 MG TABS Take 1 tablet by mouth daily.    [provider]  montelukast (SINGULAIR) 10 MG tablet Take 1 tablet (10 mg total) by mouth at bedtime. 06/13/22   Tanda Rockers, MD  oxymetazoline (AFRIN) 0.05 % nasal spray Place 2 sprays into both nostrils 2 (two) times daily as needed (nasal stuffiness and nasal pressure for 5 days).     [provider]  pantoprazole (PROTONIX) 40 MG tablet Take 40 mg by mouth daily. 04/13/22   [provider]  posaconazole (NOXAFIL) 100 MG TBEC delayed-release tablet Take 3 tablets twice daily tomorrow, then 3 tablets daily, always take with food 05/25/22   Maryjane Hurter, MD  predniSONE (DELTASONE) 5 MG tablet Take 2 tablets (10 mg total) by mouth daily with breakfast. Take 5 to 10 mg daily at least until you start nucala Patient not taking: Reported on 06/01/2022 05/23/22   Maryjane Hurter, MD  Respiratory Therapy Supplies (FLUTTER) DEVI Use as directed    [provider]  sodium chloride (OCEAN) 0.65 % SOLN nasal spray Place 2 sprays into both nostrils as needed (dry nose).     [provider]  Spacer/Aero-Holding Dorise Bullion by Does not apply route.  [provider]  spironolactone (ALDACTONE) 25 MG tablet Take 1 tablet (25 mg total) by mouth daily. 03/24/22   Biagio Borg, MD  SYMBICORT 80-4.5 MCG/ACT inhaler Inhale 2 puffs into the lungs 2 (two) times daily. 03/24/22   Maryjane Hurter, MD  VITAMIN D PO Take by mouth daily.    [provider]      Allergies    Ivp dye [iodinated contrast media], Promethazine-codeine, and Tramadol    Review of Systems   Review of Systems  Physical Exam Updated Vital Signs BP 126/76 (BP Location: Left Arm)   Pulse 86   Temp 98 F (36.7 C) (Oral)   Resp 17   LMP  (LMP Unknown)   SpO2 98%   Physical Exam Vitals and nursing note reviewed.  Constitutional:      General: She is not in acute distress.    Appearance: She is well-developed. She is not ill-appearing.  HENT:     Head: Normocephalic and atraumatic.  Eyes:     Extraocular Movements: Extraocular movements intact.     Conjunctiva/sclera: Conjunctivae normal.     Pupils: Pupils are equal, round, and reactive to light.  Cardiovascular:     Rate and Rhythm: Normal rate and regular rhythm.     Pulses: Normal pulses.     Heart sounds: Normal heart sounds.  Pulmonary:     Effort: Pulmonary effort is normal. No respiratory distress.     Breath sounds: Normal breath sounds.  Abdominal:     Palpations: Abdomen is soft.     Tenderness: There is no abdominal tenderness.  Musculoskeletal:        General: No swelling.     Cervical back: Neck supple.  Skin:    General: Skin is warm and dry.     Capillary Refill: Capillary refill takes less than 2 seconds.  Neurological:     General: No focal deficit present.     Mental Status: She is alert and oriented to person, place, and time.     Cranial Nerves: No cranial nerve deficit.     Sensory: No sensory deficit.     Motor: No weakness.     Coordination: Coordination normal.  Psychiatric:        Mood and Affect: Mood normal.     ED Results / Procedures / Treatments   Labs (all labs ordered are listed, but only abnormal results are displayed) Labs Reviewed  URINALYSIS, ROUTINE W REFLEX MICROSCOPIC - Abnormal; Notable for the following components:      Result Value   APPearance HAZY (*)    All other components within normal limits  CBC WITH DIFFERENTIAL/PLATELET - Abnormal; Notable for the following components:   RBC 3.78 (*)    All other components within normal limits  BASIC METABOLIC PANEL - Abnormal; Notable for the following components:   Creatinine, Ser 1.34 (*)    GFR, Estimated 43 (*)    All other components within normal limits  SARS CORONAVIRUS 2 BY RT  PCR    EKG None  Radiology No results found.  Procedures Procedures    Medications Ordered in ED Medications  acetaminophen (TYLENOL) tablet 650 mg (has no administration in time range)    ED Course/ Medical Decision Making/ A&P                           Medical Decision Making Amount and/or Complexity of Data Reviewed Labs: ordered.  Risk OTC  drugs.   Trenae Brunke is here with viral type symptoms with body aches and chills.  History of hypertension, reflux, asthma.  Overall differential diagnosis is likely viral process.  I have no concern for pneumonia or UTI or intra-abdominal or cardiac or pulmonary process.  Chills for the last several hours.  Normal vitals with no fever.  Very well-appearing.  No abdominal tenderness.  To evaluate CBC, BMP, urinalysis has been ordered.  COVID test ordered.  COVID test per my review and interpretation is negative.  For further review and interpretation of her labs and urinalysis there is no significant anemia, electrolyte abnormality, kidney injury, leukocytosis.  Overall suspect viral process.  Patient given Tylenol.  Discharged in good condition.  This chart was dictated using voice recognition software.  Despite best efforts to proofread,  errors can occur which can change the documentation meaning.         Final Clinical Impression(s) / ED Diagnoses Final diagnoses:  Upper respiratory tract infection, unspecified type    Rx / DC Orders ED Discharge Orders     None         Lennice Sites, DO 07/01/22 [phone]

## 2022-07-01 NOTE — Discharge Instructions (Signed)
Overall suspect that you have a viral process.  Follow-up with your primary care doctor.

## 2022-07-03 ENCOUNTER — Ambulatory Visit (INDEPENDENT_AMBULATORY_CARE_PROVIDER_SITE_OTHER): Payer: PPO

## 2022-07-03 ENCOUNTER — Ambulatory Visit: Payer: PPO

## 2022-07-03 DIAGNOSIS — J4550 Severe persistent asthma, uncomplicated: Secondary | ICD-10-CM | POA: Diagnosis not present

## 2022-07-04 ENCOUNTER — Telehealth: Payer: Self-pay | Admitting: Internal Medicine

## 2022-07-04 ENCOUNTER — Telehealth: Payer: Self-pay | Admitting: Allergy & Immunology

## 2022-07-04 NOTE — Telephone Encounter (Signed)
Place in Dr Gillermina Hu shelf for review

## 2022-07-04 NOTE — Telephone Encounter (Signed)
Andrea Mason brought in a form that needs to be filled out by Dr. Ernst Bowler for her apartment complex. Form needs to be filled out and the apartment complex would like it emailed or faxed back to them.  I placed form in nurses station in the nurses box.

## 2022-07-04 NOTE — Telephone Encounter (Signed)
Pt needs refill of spironolactone (ALDACTONE) 25 MG table   Please send to [facility] #[id number]  Phone:  [phone]  Fax:  [phone]

## 2022-07-05 ENCOUNTER — Other Ambulatory Visit: Payer: Self-pay

## 2022-07-05 MED ORDER — SPIRONOLACTONE 25 MG PO TABS: 25.0000 mg | ORAL_TABLET | Freq: Every day | ORAL | 2 refills | Status: DC

## 2022-07-05 MED ORDER — BUDESONIDE-FORMOTEROL FUMARATE 80-4.5 MCG/ACT IN AERO: 2.0000 | INHALATION_SPRAY | Freq: Two times a day (BID) | RESPIRATORY_TRACT | 11 refills | Status: DC

## 2022-07-05 NOTE — Telephone Encounter (Signed)
Refill sent to pharmacy.   

## 2022-07-05 NOTE — Progress Notes (Signed)
New order printed for signature for [facility]

## 2022-07-06 NOTE — Telephone Encounter (Addendum)
Form has been faxed to [facility]   The reasonable accommodation/modification verification form for [facility] [address] [place] [address] -  has been reviewed/completed/signed by provider.  I tried calling the Andrea Mason/Andrea Mason  Fax: [phone]- per recording office is closed on Thursdays. I will contact office on Friday, 07/07/22 to confirm secure fax line before faxing the completed form.  Patient notified via myChart of form completion.

## 2022-07-06 NOTE — Telephone Encounter (Signed)
Filled out and placed in nursing station.  Salvatore Marvel, MD [facility] [place]

## 2022-07-14 ENCOUNTER — Encounter: Payer: Self-pay | Admitting: Internal Medicine

## 2022-07-14 ENCOUNTER — Ambulatory Visit (INDEPENDENT_AMBULATORY_CARE_PROVIDER_SITE_OTHER): Payer: PPO | Admitting: Internal Medicine

## 2022-07-14 VITALS — BP 134/66 | HR 77 | Temp 98.1°F | Ht 59.25 in | Wt 177.8 lb

## 2022-07-14 DIAGNOSIS — M5431 Sciatica, right side: Secondary | ICD-10-CM | POA: Diagnosis not present

## 2022-07-14 DIAGNOSIS — J301 Allergic rhinitis due to pollen: Secondary | ICD-10-CM | POA: Diagnosis not present

## 2022-07-14 DIAGNOSIS — M545 Low back pain, unspecified: Secondary | ICD-10-CM | POA: Diagnosis not present

## 2022-07-14 DIAGNOSIS — M160 Bilateral primary osteoarthritis of hip: Secondary | ICD-10-CM | POA: Diagnosis not present

## 2022-07-14 MED ORDER — GABAPENTIN 100 MG PO CAPS: 100.0000 mg | ORAL_CAPSULE | Freq: Three times a day (TID) | ORAL | 3 refills | Status: DC

## 2022-07-14 NOTE — Patient Instructions (Signed)
Please take all new medication as prescribed - the gabapentin low dose 100 mg three times per day  Ok to take tylenol also for the arthritis pain  Please make appt at the Sports Medicine on the first floor as well  Please take all new medication as recommended - the zyrtec, nasacort , and pataday as needed  Please continue all other medications as before, and refills have been done if requested.  Please have the pharmacy call with any other refills you may need.  Please continue your efforts at being more active, low cholesterol diet, and weight control.  Please keep your appointments with your specialists as you may have planned

## 2022-07-14 NOTE — Progress Notes (Unsigned)
Patient ID: Andrea Mason, female   DOB: 1951-11-02, 71 y.o.   MRN: [id number]        Chief Complaint: follow up recent URI, allergies, right sciatica       HPI:  Andrea Mason is a 71 y.o. female here after recent URI seen at ED now resolved, but Does have several wks ongoing nasal allergy symptoms with clearish congestion, itch and sneezing, without fever, pain, ST, cough, swelling or wheezing.  Also has 2 wks onset right sciatica with worsening gait difficulty, but no fall, Pt continues to have recurring LBP without change in severity, bowel or bladder change, fever, wt loss, or falls. Has known bilateral hip djd controlled with tylenol.  Pt denies chest pain, increased sob or doe, wheezing, orthopnea, PND, increased LE swelling, palpitations, dizziness or syncope.   Pt denies fever, wt loss, night sweats, loss of appetite, or other constitutional symptoms   Pt denies polydipsia, polyuria, or new focal neuro s/s.       Wt Readings from Last 3 Encounters:  07/14/22 177 lb 12.8 oz (80.6 kg)  06/01/22 173 lb 9.6 oz (78.7 kg)  05/23/22 174 lb (78.9 kg)   BP Readings from Last 3 Encounters:  07/14/22 134/66  07/01/22 132/81  06/01/22 112/70         Past Medical History:  Diagnosis Date   Allergic rhinitis    Allergy    Asthma    Depression    DJD (degenerative joint disease), cervical    GERD (gastroesophageal reflux disease)    History of colonic polyps    Hypertension    Obesity    Primary hyperaldosteronism ([place]) 11/06/2013   Stroke ([place])    Syncope    Past Surgical History:  Procedure Laterality Date   COLONOSCOPY     FOOT SURGERY  1998   Implantable loop recorder placement  09/16/14   MDT LINQ implanted by Dr Rayann Heman for cryptogenic stroke, RIO II protocol   LOOP RECORDER REMOVAL N/A 10/02/2017   Procedure: LOOP RECORDER REMOVAL;  Surgeon: [name], MD;  Location: [facility];  Service: Cardiovascular;  Laterality: N/A;   NASAL POLYP SURGERY  07/2006   x 2 with  Dr. [name]   NASAL SINUS SURGERY  07/2006   Dr. [name]   TUBAL LIGATION      reports that she has never smoked. She has been exposed to tobacco smoke. She has never used smokeless tobacco. She reports current alcohol use. She reports that she does not use drugs. family history includes Allergic rhinitis in her brother, brother, father, mother, and sister; Arrhythmia in her brother; Asthma in her mother and sister; Cancer in her mother; Colon cancer in her brother; Congenital heart disease in her brother; Diabetes in her sister; Heart attack in her brother; Lung disease in her son; Stroke in her brother. Allergies  Allergen Reactions   Ivp Dye [Iodinated Contrast Media] Nausea And Vomiting and Other (See Comments)    hot flashes   Promethazine-Codeine Nausea And Vomiting   Tramadol Other (See Comments)    Almost passed out   Current Outpatient Medications on File Prior to Visit  Medication Sig Dispense Refill   acetaminophen (TYLENOL) 325 MG tablet Take as directed per bottle instructions     albuterol (VENTOLIN HFA) 108 (90 Base) MCG/ACT inhaler Inhale 1-2 puffs into the lungs every 6 (six) hours as needed for wheezing or shortness of breath. 1 each 11   amLODipine (NORVASC) 5 MG tablet Take 1 tablet (  5 mg total) by mouth daily. 90 tablet 3   aspirin 325 MG tablet Take 325 mg by mouth daily.     Biotin 10000 MCG TABS Take 10,000 mcg by mouth daily.     budesonide-formoterol (SYMBICORT) 80-4.5 MCG/ACT inhaler Inhale 2 puffs into the lungs 2 (two) times daily. 10.2 g 11   cetirizine (ZYRTEC) 10 MG tablet Take 10 mg by mouth at bedtime.      Cholecalciferol (VITAMIN D3) 2000 units TABS Take 2,000 Units by mouth daily.     Cyanocobalamin (VITAMIN B-12 PO) Take by mouth daily.     dextromethorphan-guaiFENesin (MUCINEX DM) 30-600 MG 12hr tablet Take 1-2 tablets by mouth 2 (two) times daily as needed (for congestion/cough (WITH FLUTTER VALVE)).      fluticasone (FLONASE) 50 MCG/ACT nasal  spray Place 2 sprays into both nostrils daily. 16 g 5   Magnesium 250 MG TABS Take 1 tablet by mouth daily.     Mepolizumab (NUCALA) 100 MG/ML SOAJ Inject into the skin.     montelukast (SINGULAIR) 10 MG tablet Take 1 tablet (10 mg total) by mouth at bedtime. 30 tablet 11   oxymetazoline (AFRIN) 0.05 % nasal spray Place 2 sprays into both nostrils 2 (two) times daily as needed (nasal stuffiness and nasal pressure for 5 days).      pantoprazole (PROTONIX) 40 MG tablet Take 40 mg by mouth daily.     Respiratory Therapy Supplies (FLUTTER) DEVI Use as directed     sodium chloride (OCEAN) 0.65 % SOLN nasal spray Place 2 sprays into both nostrils as needed (dry nose).      Spacer/Aero-Holding Dorise Bullion by Does not apply route.     spironolactone (ALDACTONE) 25 MG tablet Take 1 tablet (25 mg total) by mouth daily. 90 tablet 2   SYMBICORT 80-4.5 MCG/ACT inhaler Inhale 2 puffs into the lungs 2 (two) times daily. 11 g 11   VITAMIN D PO Take by mouth daily.     posaconazole (NOXAFIL) 100 MG TBEC delayed-release tablet Take 3 tablets twice daily tomorrow, then 3 tablets daily, always take with food (Patient not taking: Reported on 07/14/2022) 90 tablet 3   Current Facility-Administered Medications on File Prior to Visit  Medication Dose Route Frequency Provider Last Rate Last Admin   mepolizumab (NUCALA) injection 100 mg  100 mg Subcutaneous Q28 days Valentina Shaggy, MD   100 mg at 07/03/22 1437        ROS:  All others reviewed and negative.  Objective        PE:  BP 134/66 (BP Location: Right Arm, Patient Position: Sitting, Cuff Size: Large)   Pulse 77   Temp 98.1 F (36.7 C) (Oral)   Ht 4' 11.25" (1.505 m)   Wt 177 lb 12.8 oz (80.6 kg)   LMP  (LMP Unknown)   SpO2 100%   BMI 35.61 kg/m                 Constitutional: Pt appears in NAD               HENT: Head: NCAT.                Right Ear: External ear normal.                 Left Ear: External ear normal.  Bilat tm's with mild  erythema.  Max sinus areas non tender.  Pharynx with mild erythema, no exudate  Eyes: . Pupils are equal, round, and reactive to light. Conjunctivae with mild erythema and EOM are normal               Nose: without d/c or deformity               Neck: Neck supple. Gross normal ROM               Cardiovascular: Normal rate and regular rhythm.                 Pulmonary/Chest: Effort normal and breath sounds without rales or wheezing.                Abd:  Soft, NT, ND, + BS, no organomegaly               Neurological: Pt is alert. At baseline orientation, motor grossly intact               Skin: Skin is warm. No rashes, no other new lesions, LE edema - none               Psychiatric: Pt behavior is normal without agitation   Micro: none  Cardiac tracings I have personally interpreted today:  none  Pertinent Radiological findings (summarize): none   Lab Results  Component Value Date   WBC 5.8 07/01/2022   HGB 12.0 07/01/2022   HCT 36.0 07/01/2022   PLT 165 07/01/2022   GLUCOSE 93 07/01/2022   CHOL 79 03/06/2022   TRIG 111.0 03/06/2022   HDL 47.00 03/06/2022   LDLCALC 10 03/06/2022   ALT 14 05/23/2022   AST 17 05/23/2022   NA 138 07/01/2022   K 3.8 07/01/2022   CL 104 07/01/2022   CREATININE 1.34 (H) 07/01/2022   BUN 12 07/01/2022   CO2 24 07/01/2022   TSH 1.66 03/06/2022   INR 1.0 12/26/2019   HGBA1C 6.1 03/06/2022   Assessment/Plan:  Andrea Mason is a 71 y.o. Black or African American [2] female with  has a past medical history of Allergic rhinitis, Allergy, Asthma, Depression, DJD (degenerative joint disease), cervical, GERD (gastroesophageal reflux disease), History of colonic polyps, Hypertension, Obesity, Primary hyperaldosteronism ([place]) (11/06/2013), Stroke ([place]), and Syncope.  Seasonal allergic rhinitis due to pollen Mild to mod, for add zyrtec and nasacort asd, as well as otc pataday for the eyes,,  to f/u any worsening symptoms or concerns  Right sided  sciatica Recent onset, mild to mod, no worsening neuro changes, declines MRI, but will f/u with sports medicine, start gabapentin 100 mg tid  Low back pain Overall stable, continue current tx  Bilateral hip joint arthritis Mild by imaging and pain, for tylenol prn,  to f/u any worsening symptoms or concerns  Followup: Return in about 8 weeks (around 09/05/2022).  Cathlean Cower, MD 07/16/2022 8:09 PM [facility]

## 2022-07-16 DIAGNOSIS — M160 Bilateral primary osteoarthritis of hip: Secondary | ICD-10-CM | POA: Insufficient documentation

## 2022-07-16 NOTE — Assessment & Plan Note (Signed)
Mild by imaging and pain, for tylenol prn,  to f/u any worsening symptoms or concerns

## 2022-07-16 NOTE — Assessment & Plan Note (Signed)
Overall stable, continue current tx

## 2022-07-16 NOTE — Assessment & Plan Note (Signed)
Mild to mod, for add zyrtec and nasacort asd, as well as otc pataday for the eyes,,  to f/u any worsening symptoms or concerns

## 2022-07-16 NOTE — Assessment & Plan Note (Addendum)
Recent onset, mild to mod, no worsening neuro changes, declines MRI, but will f/u with sports medicine, start gabapentin 100 mg tid

## 2022-07-17 ENCOUNTER — Other Ambulatory Visit: Payer: Self-pay

## 2022-07-17 MED ORDER — PANTOPRAZOLE SODIUM 40 MG PO TBEC: 40.0000 mg | DELAYED_RELEASE_TABLET | Freq: Every day | ORAL | 1 refills | Status: DC

## 2022-07-18 ENCOUNTER — Ambulatory Visit: Payer: PPO | Admitting: Internal Medicine

## 2022-07-18 ENCOUNTER — Ambulatory Visit: Payer: PPO | Admitting: Family Medicine

## 2022-07-18 ENCOUNTER — Ambulatory Visit (INDEPENDENT_AMBULATORY_CARE_PROVIDER_SITE_OTHER): Payer: PPO

## 2022-07-18 VITALS — BP 100/72 | HR 77 | Ht 59.0 in | Wt 178.6 lb

## 2022-07-18 DIAGNOSIS — M48061 Spinal stenosis, lumbar region without neurogenic claudication: Secondary | ICD-10-CM | POA: Diagnosis not present

## 2022-07-18 DIAGNOSIS — M5416 Radiculopathy, lumbar region: Secondary | ICD-10-CM

## 2022-07-18 NOTE — Progress Notes (Signed)
I, Peterson Lombard, LAT, ATC acting as a scribe for Lynne Leader, MD.  Subjective:    CC: Bilat leg pain   HPI: Pt is a 71 y/o female c/o bilat leg pain. Pt was last seen by Dr. Georgina Snell on 01/28/20 for R-sided lumbar radiculopathy. Pt's last ESI was on 02/02/20 at R L4 nerve root. Today, pt reports bilat leg pain, R>L, along the anterior aspect to the foot. Pt also has discomfort across both sides of her abdomin.   Radiating pain: yes LE numbness/tingling: no LE weakness: yes, R>L Aggravates: any movement Treatments tried: Tylenol, gabapentin '100mg'$ , muscle rub  Dx imaging: 05/23/22 Bilat hips XR  01/25/20 L-spine MRI  12/17/19 L-spine XR  Pertinent review of Systems: No fevers or chills  Relevant historical information: Hypertension   Objective:    Vitals:   07/18/22 1341  BP: 100/72  Pulse: 77  SpO2: 97%   General: Well Developed, well nourished, and in no acute distress.   MSK: L-spine: Nontender midline.  Decreased lumbar motion. Lower extremity strength is intact. Reflexes are intact.  Hip range of motion intact bilaterally.  Lab and Radiology Results  X-ray images L-spine obtained today personally and independently interpreted. DDD worse at L5-S1.  No acute fractures are present. Wait formal radiology review    EXAM: DG HIP (WITH OR WITHOUT PELVIS) 3-4V BILAT   COMPARISON:  None Available.   FINDINGS: There is diffuse decreased bone mineralization. Mild-to-moderate bilateral femoroacetabular joint space narrowing and superolateral acetabular degenerative osteophytes. Mild pubic symphysis subchondral sclerosis. The bilateral sacroiliac joint spaces are maintained. No acute fracture or dislocation. Bilateral tubal ligation clips are noted.   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 No acute fracture.     Electronically Signed   By: Yvonne Kendall M.D.   On: 05/24/2022 09:38   EXAM: MRI LUMBAR SPINE  WITHOUT CONTRAST   TECHNIQUE: Multiplanar, multisequence MR imaging of the lumbar spine was performed. No intravenous contrast was administered.   COMPARISON:  Lumbar radiographs 12/17/2019.   FINDINGS: Segmentation:  Normal on the comparison.   Alignment: Improved lumbar lordosis from last month. There is mild grade 1 anterolisthesis of L2 on L3, 2-3 mm.   Vertebrae: There is a faint L1 superior endplate Schmorl's node with trace marrow edema. No other marrow edema or evidence of acute osseous abnormality. Background bone marrow signal within normal limits. Intact visible sacrum and SI joints.   Conus medullaris and cauda equina: Conus extends to the T12-L1 level. No lower spinal cord or conus signal abnormality.   Paraspinal and other soft tissues: Negative.   Disc levels:   T12-L1: Mild to moderate ligament flavum hypertrophy, but no significant stenosis.   L1-L2: Minimal disc bulge and mild ligament flavum hypertrophy. No stenosis.   L2-L3: Mild grade 1 anterolisthesis. Disc desiccation and disc space loss. Circumferential disc bulge. Moderate posterior element hypertrophy. Mild to moderate spinal and right lateral recess stenosis (right L3 nerve level). Moderate to severe right L2 foraminal stenosis in large part due to disc. Mild to moderate left foraminal stenosis.   L3-L4: Disc desiccation and circumferential disc bulge with small central annular fissure. Moderate posterior element hypertrophy. Mild to moderate spinal and left lateral recess stenosis (left L4 nerve level). There is also fairly bulky foraminal disc at this level on the left with at least moderate L3 neural foraminal stenosis (series 4, image 12). Moderate right foraminal stenosis.   L4-L5: Disc desiccation and circumferential disc bulge with superimposed slightly  caudal central disc protrusion (series 4, image 7 and series 9, image 31). Moderate posterior element hypertrophy. Mild to moderate  spinal and moderate to severe lateral recess stenosis (L5 nerve levels). Moderate bilateral L4 foraminal stenosis.   L5-S1: Left far lateral disc osteophyte complex. Epidural lipomatosis effaces CSF from the thecal sac at this level. Only mild left L5 foraminal stenosis.   IMPRESSION: 1. Multilevel advanced lumbar disc and posterior element degeneration L2-L3 through L3-L4. Grade 1 anterolisthesis at the former. 2. Up to moderate multifactorial spinal and lateral recess stenosis at those levels, as well as right foraminal stenosis at each level, which could combine for right L2 through L5 nerve level radiculitis.     Electronically Signed   By: Genevie Ann M.D.   On: 01/25/2020 17:06 I, Lynne Leader, personally (independently) visualized and performed the interpretation of the images attached in this note.   Impression and Recommendations:    Assessment and Plan: 71 y.o. female with leg pain right worse than left thought to be mostly due to lumbar radiculopathy.  Based on her prior MRI from 2021 it is likely multilevel radicular pain most dominant at L4.  Some of her anterior thigh pain is probably due to hip arthritis but this is less dominant.  She had good results with a an epidural steroid injection in February 2021.  I think a repeat epidural steroid injection is reasonable now.  If this does not provide lasting benefit her good benefit would recommend repeat MRI as its been quite a while and her symptoms are new and more significant now.  Repeat MRI would be helpful.Marland Kitchen  PDMP not reviewed this encounter. Orders Placed This Encounter  Procedures   DG INJECT DIAG/THERA/INC NEEDLE/CATH/PLC EPI/LUMB/SAC W/IMG    LUMB EPI # 1 HTA 178 LBS PACS (01/25/20) *ASA '325MG'$ *    Standing Status:   Future    Standing Expiration Date:   07/19/2023    Order Specific Question:   Reason for Exam (SYMPTOM  OR DIAGNOSIS REQUIRED)    Answer:   ESI level and technique per radiology. Worse symptoms C/w Rt L4     Order Specific Question:   Preferred Imaging Location?    Answer:   [id number]    Order Specific Question:   Radiology Contrast Protocol - do NOT remove file path    Answer:   [email or web address]   DG Lumbar Spine 2-3 Views    Standing Status:   Future    Number of Occurrences:   1    Standing Expiration Date:   07/19/2023    Order Specific Question:   Reason for Exam (SYMPTOM  OR DIAGNOSIS REQUIRED)    Answer:   eval poss worsening lumbar radiculopathy    Order Specific Question:   Preferred imaging location?    Answer:   [facility]   No orders of the defined types were placed in this encounter.   Discussed warning signs or symptoms. Please see discharge instructions. Patient expresses understanding.   The above documentation has been reviewed and is accurate and complete Lynne Leader, M.D.

## 2022-07-18 NOTE — Patient Instructions (Addendum)
Thank you for coming in today.   STOP aspirin for the back injection  Please call [facility] at [phone] to schedule your spine injection.    Ok to increase the gabapentin if needed.   Let me know how it goes. If you do not get better we will do an MRI.   Get an xray today here.

## 2022-07-20 NOTE — Progress Notes (Signed)
Lumbar spine x-ray shows worsening arthritis changes in the back since last x-ray in 2021.

## 2022-07-24 ENCOUNTER — Ambulatory Visit
Admission: RE | Admit: 2022-07-24 | Discharge: 2022-07-24 | Disposition: A | Discharge status: Home / Self Care | Payer: PPO | Source: Ambulatory Visit | Attending: Family Medicine | Admitting: Family Medicine

## 2022-07-24 DIAGNOSIS — M4727 Other spondylosis with radiculopathy, lumbosacral region: Secondary | ICD-10-CM | POA: Diagnosis not present

## 2022-07-24 DIAGNOSIS — M5416 Radiculopathy, lumbar region: Secondary | ICD-10-CM

## 2022-07-24 MED ORDER — IOPAMIDOL (ISOVUE-M 200) INJECTION 41%
1.0000 mL | Freq: Once | INTRAMUSCULAR | Status: AC
  Administered 2022-07-24: 1 mL via EPIDURAL

## 2022-07-24 MED ORDER — METHYLPREDNISOLONE ACETATE 40 MG/ML INJ SUSP (RADIOLOG
80.0000 mg | Freq: Once | INTRAMUSCULAR | Status: AC
  Administered 2022-07-24: 80 mg via EPIDURAL

## 2022-07-24 NOTE — Discharge Instructions (Signed)

## 2022-07-31 ENCOUNTER — Ambulatory Visit (INDEPENDENT_AMBULATORY_CARE_PROVIDER_SITE_OTHER): Payer: PPO

## 2022-07-31 DIAGNOSIS — J4550 Severe persistent asthma, uncomplicated: Secondary | ICD-10-CM | POA: Diagnosis not present

## 2022-08-01 ENCOUNTER — Encounter: Payer: Self-pay | Admitting: Family Medicine

## 2022-08-01 ENCOUNTER — Ambulatory Visit (INDEPENDENT_AMBULATORY_CARE_PROVIDER_SITE_OTHER): Payer: PPO | Admitting: Family Medicine

## 2022-08-01 ENCOUNTER — Telehealth: Payer: Self-pay

## 2022-08-01 VITALS — BP 110/76 | HR 64 | Temp 97.7°F | Ht 59.0 in | Wt 174.0 lb

## 2022-08-01 DIAGNOSIS — R7989 Other specified abnormal findings of blood chemistry: Secondary | ICD-10-CM

## 2022-08-01 DIAGNOSIS — M25471 Effusion, right ankle: Secondary | ICD-10-CM | POA: Diagnosis not present

## 2022-08-01 DIAGNOSIS — M25472 Effusion, left ankle: Secondary | ICD-10-CM | POA: Diagnosis not present

## 2022-08-01 NOTE — Patient Instructions (Signed)
Ok to stop amlodipine 5 mg tablets x 2-3 weeks until you follow up with Dr. Jenny Reichmann to see if the swelling improves.   Keep an eye on your blood pressure at home.   Come to your next visit well hydrated in case Dr. Jenny Reichmann wants to recheck your kidney function.

## 2022-08-01 NOTE — Progress Notes (Signed)
Subjective:     Patient ID: Andrea Mason, female    DOB: Oct 06, 1951, 71 y.o.   MRN: [id number]  Chief Complaint  Patient presents with   Foot Swelling    Left foot swelling for about 2 weeks, states morning is when it is not so bad but as the day goes on it gets bigger   other    Would like advise if she should continue gabapentin as she recently had an injection to help with the pain.   Also like her mouth checked for thrush.    HPI Patient is in today for intermittent ankle swelling, L>R. States swelling improves after laying down at night and worsens throughout the day. She is concerned that her BP medication may be causing this.  She sees Dr. Jenny Reichmann as her PCP.  No chest pain, palpitations, shortness of breath, cough, abdominal pain, N/V/D.   States she is concerned about recent creatinine.   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 [name]   NASAL SINUS SURGERY  07/2006   Dr. [name]   TUBAL LIGATION      Family History  Problem Relation Age of Onset   Asthma Mother    Allergic rhinitis Mother    Cancer Mother    Allergic rhinitis Father    Asthma Sister    Allergic rhinitis  Sister    Diabetes Sister    Allergic rhinitis Brother    Colon cancer Brother    Stroke Brother    Arrhythmia Brother    Allergic rhinitis Brother    Congenital heart disease Brother    Heart attack Brother    Lung disease Son        Sarcoid vs ARDS related fibrosis, required 2 double lung transplants   Esophageal cancer Neg Hx    Rectal cancer Neg Hx    Stomach cancer Neg Hx     Social History   Socioeconomic History   Marital status: Widowed    Spouse name: Not on file   Number of children: 3   Years of education: Not on file   Highest education level: Not on file  Occupational History   Occupation: Publishing copy    Comment: [employer]  Tobacco Use   Smoking status: Never    Passive exposure: Current (on sister's clothing bc sister is a smoker)   Smokeless tobacco: Never  Vaping Use   Vaping Use: Never used  Substance and Sexual Activity   Alcohol use: Yes    Comment: socially wine   Drug use: No  Sexual activity: Not Currently    Birth control/protection: Post-menopausal  Other Topics Concern   Not on file  Social History Narrative   Widowed, husband died in Mar 02, 2006   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   Patient lives alone.   Social Determinants of Health   Financial Resource Strain: Not on file  Food Insecurity: Not on file  Transportation Needs: Not on file  Physical Activity: Not on file  Stress: Not on file  Social Connections: Not on file  Intimate Partner Violence: Not on file    Outpatient Medications Prior to Visit  Medication Sig Dispense Refill   acetaminophen (TYLENOL) 325 MG tablet Take as directed per bottle instructions     albuterol (VENTOLIN HFA) 108 (90 Base) MCG/ACT inhaler Inhale 1-2 puffs into the lungs every 6 (six) hours as needed for wheezing or shortness of breath. 1 each 11   amLODipine (NORVASC) 5 MG tablet Take 1 tablet (5 mg total) by mouth daily. 90 tablet  3   aspirin 325 MG tablet Take 325 mg by mouth daily.     Biotin 10000 MCG TABS Take 10,000 mcg by mouth daily.     budesonide-formoterol (SYMBICORT) 80-4.5 MCG/ACT inhaler Inhale 2 puffs into the lungs 2 (two) times daily. 10.2 g 11   cetirizine (ZYRTEC) 10 MG tablet Take 10 mg by mouth at bedtime.      Cholecalciferol (VITAMIN D3) 2000 units TABS Take 2,000 Units by mouth daily.     Cyanocobalamin (VITAMIN B-12 PO) Take by mouth daily.     dextromethorphan-guaiFENesin (MUCINEX DM) 30-600 MG 12hr tablet Take 1-2 tablets by mouth 2 (two) times daily as needed (for congestion/cough (WITH FLUTTER VALVE)).      fluticasone (FLONASE) 50 MCG/ACT nasal spray Place 2 sprays into both nostrils daily. 16 g 5   gabapentin (NEURONTIN) 100 MG capsule Take 1 capsule (100 mg total) by mouth 3 (three) times daily. 90 capsule 3   Magnesium 250 MG TABS Take 1 tablet by mouth daily.     Mepolizumab (NUCALA) 100 MG/ML SOAJ Inject into the skin.     montelukast (SINGULAIR) 10 MG tablet Take 1 tablet (10 mg total) by mouth at bedtime. 30 tablet 11   oxymetazoline (AFRIN) 0.05 % nasal spray Place 2 sprays into both nostrils 2 (two) times daily as needed (nasal stuffiness and nasal pressure for 5 days).      pantoprazole (PROTONIX) 40 MG tablet Take 1 tablet (40 mg total) by mouth daily. 90 tablet 1   Respiratory Therapy Supplies (FLUTTER) DEVI Use as directed     sodium chloride (OCEAN) 0.65 % SOLN nasal spray Place 2 sprays into both nostrils as needed (dry nose).      Spacer/Aero-Holding Dorise Bullion by Does not apply route.     spironolactone (ALDACTONE) 25 MG tablet Take 1 tablet (25 mg total) by mouth daily. 90 tablet 2   SYMBICORT 80-4.5 MCG/ACT inhaler Inhale 2 puffs into the lungs 2 (two) times daily. 11 g 11   VITAMIN D PO Take by mouth daily.     Facility-Administered Medications Prior to Visit  Medication Dose Route Frequency Provider Last Rate Last Admin   mepolizumab (NUCALA) injection 100 mg  100 mg  Subcutaneous Q28 days Valentina Shaggy, MD   100 mg at 07/31/22 1532    Allergies  Allergen Reactions   Ivp Dye [Iodinated Contrast Media] Nausea And Vomiting and  Other (See Comments)    hot flashes   Promethazine-Codeine Nausea And Vomiting   Tramadol Other (See Comments)    Almost passed out    ROS     Objective:    Physical Exam Constitutional:      General: She is not in acute distress.    Appearance: She is not ill-appearing.  Cardiovascular:     Rate and Rhythm: Normal rate and regular rhythm.     Comments: Bilateral ankle edema, trace on right and 1+ non pitting on left. No calf swelling, erythema, or tenderness.  Pulmonary:     Effort: Pulmonary effort is normal.     Breath sounds: Normal breath sounds.  Skin:    General: Skin is warm and dry.  Neurological:     General: No focal deficit present.     Mental Status: She is alert and oriented to person, place, and time.     Cranial Nerves: No cranial nerve deficit.     Sensory: No sensory deficit.     Motor: No weakness.     Gait: Gait normal.  Psychiatric:        Mood and Affect: Mood normal.        Behavior: Behavior normal.     BP 110/76 (BP Location: Left Arm, Patient Position: Sitting, Cuff Size: Large)   Pulse 64   Temp 97.7 F (36.5 C) (Temporal)   Ht '4\' 11"'$  (1.499 m)   Wt 174 lb (78.9 kg)   LMP  (LMP Unknown)   SpO2 99%   BMI 35.14 kg/m  Wt Readings from Last 3 Encounters:  08/01/22 174 lb (78.9 kg)  07/18/22 178 lb 9.6 oz (81 kg)  07/14/22 177 lb 12.8 oz (80.6 kg)       Assessment & Plan:   Problem List Items Addressed This Visit       Other   Ankle edema, bilateral - Primary   Elevated serum creatinine   She may hold her amlodipine to see if ankle edema improves and then follow up with her PCP in 2-3 weeks.  Closely monitor BP at home.  Recommend coming in well hydrated for her follow up visit for recheck of renal function.   I have discontinued Annica Femia's VITAMIN D  PO. I am also having her maintain her oxymetazoline, sodium chloride, cetirizine, aspirin, Biotin, Vitamin D3, dextromethorphan-guaiFENesin, Magnesium, acetaminophen, Flutter, fluticasone, Symbicort, amLODipine, Spacer/Aero-Holding Chambers, Cyanocobalamin (VITAMIN B-12 PO), albuterol, montelukast, spironolactone, budesonide-formoterol, Nucala, gabapentin, and pantoprazole. We will continue to administer mepolizumab.  No orders of the defined types were placed in this encounter.

## 2022-08-01 NOTE — Telephone Encounter (Signed)
Pt is in office today and would like her PCP to advise if she can stop taking her gabapentin. She states she recently had [facility] which has helped and just wanted to check to see if she can stop taking it. She is currently taking it BID.

## 2022-08-01 NOTE — Telephone Encounter (Signed)
Called and spoke w pt letting her know it is ok to stop the gabapentin. Also let her know Dr. Jenny Reichmann said it's ok to restart if symptoms come back. Pt verbalized understanding.

## 2022-08-01 NOTE — Telephone Encounter (Signed)
Yes, in her case, she should be able to stop this and see how the symptoms go  Ok to restart if the symptoms come back

## 2022-08-09 ENCOUNTER — Telehealth: Payer: Self-pay

## 2022-08-09 NOTE — Telephone Encounter (Signed)
LVM for pt to call the office back with an update on last mammo or if she is needing help scheduling one.

## 2022-08-09 NOTE — Telephone Encounter (Signed)
Patient would like help scheduling a mammogram

## 2022-08-17 ENCOUNTER — Telehealth: Payer: Self-pay

## 2022-08-17 ENCOUNTER — Telehealth: Payer: Self-pay | Admitting: Student

## 2022-08-17 DIAGNOSIS — Z20822 Contact with and (suspected) exposure to covid-19: Secondary | ICD-10-CM | POA: Diagnosis not present

## 2022-08-17 DIAGNOSIS — Z6835 Body mass index (BMI) 35.0-35.9, adult: Secondary | ICD-10-CM | POA: Diagnosis not present

## 2022-08-17 DIAGNOSIS — U071 COVID-19: Secondary | ICD-10-CM | POA: Diagnosis not present

## 2022-08-17 MED ORDER — NYSTATIN 100000 UNIT/ML MT SUSP: 5.0000 mL | Freq: Four times a day (QID) | OROMUCOSAL | 0 refills | Status: DC

## 2022-08-17 NOTE — Telephone Encounter (Signed)
Patient is calling to speak with the nurse because she just tested positive for covid.  She would like some advice.  CB# [phone]

## 2022-08-17 NOTE — Telephone Encounter (Signed)
Called the patient and the patient states that her whole tongue is white and she does see white bumps overtop of the white tongue. Per Dr. Ernst Bowler nystatin is being sent into the pharmacy.  Patient also states that the Outpatient clinic that she just left just prescribed her medication to help with COVID symptoms.

## 2022-08-17 NOTE — Telephone Encounter (Signed)
[facility], she has a sore throat and coughing. She found some old clotrimazole logenez and wondered if this is ok to take while on nucala and she also wanted to know what else she can do. Breathing is ok not distress

## 2022-08-17 NOTE — Telephone Encounter (Signed)
Patient is calling again regarding covid information.  She would like some advice if possible.

## 2022-08-17 NOTE — Telephone Encounter (Signed)
We can send in nystatin swish and spit four times daily for one week.   Are there white spots in her mouth?   Salvatore Marvel, MD [facility]

## 2022-08-17 NOTE — Addendum Note (Signed)
Addended by: Eloy End D on: 08/17/2022 06:04 PM   Modules accepted: Orders

## 2022-08-21 ENCOUNTER — Telehealth: Payer: Self-pay | Admitting: Internal Medicine

## 2022-08-21 NOTE — Telephone Encounter (Signed)
Fax received from pt's pharmacy in regards to refill of clotrimazole '10mg'$  lozenge #30. Dr. Melvyn Novas, please advise if you are okay with this being refilled.

## 2022-08-21 NOTE — Telephone Encounter (Signed)
Idon't actually see it on our list as something we rec in this office so prefer she contact whoever prescribed it for her [place]   If doesn't know or can't get them would rx 15  only then   needs ov to continue to fill this long term but in meantime be sure we sure we document how she really takes it  - most would just use one daily x 3 days prn, not as a maint rx

## 2022-08-21 NOTE — Telephone Encounter (Signed)
Called and left voicemail for patient to call office back in regards to her testing positive for covid last week

## 2022-08-21 NOTE — Telephone Encounter (Signed)
Patient called back stating she is returning a call from April.

## 2022-08-22 ENCOUNTER — Ambulatory Visit: Payer: PPO | Admitting: Internal Medicine

## 2022-08-22 NOTE — Telephone Encounter (Signed)
ATC patient but line was busy. Closing encounter since this is the second attempt to contact patient about covid. Nothing further needed

## 2022-08-23 ENCOUNTER — Ambulatory Visit (INDEPENDENT_AMBULATORY_CARE_PROVIDER_SITE_OTHER): Payer: PPO | Admitting: Internal Medicine

## 2022-08-23 VITALS — BP 108/62 | HR 77 | Temp 98.2°F | Ht 59.0 in | Wt 177.5 lb

## 2022-08-23 DIAGNOSIS — M7989 Other specified soft tissue disorders: Secondary | ICD-10-CM

## 2022-08-23 DIAGNOSIS — M79662 Pain in left lower leg: Secondary | ICD-10-CM

## 2022-08-23 DIAGNOSIS — H61101 Unspecified noninfective disorders of pinna, right ear: Secondary | ICD-10-CM

## 2022-08-23 DIAGNOSIS — Z23 Encounter for immunization: Secondary | ICD-10-CM | POA: Diagnosis not present

## 2022-08-23 DIAGNOSIS — Z1231 Encounter for screening mammogram for malignant neoplasm of breast: Secondary | ICD-10-CM

## 2022-08-23 DIAGNOSIS — I10 Essential (primary) hypertension: Secondary | ICD-10-CM | POA: Diagnosis not present

## 2022-08-23 LAB — CBC WITH DIFFERENTIAL/PLATELET
Basophils Absolute: 0 10*3/uL (ref 0.0–0.1)
Basophils Relative: 0.6 % (ref 0.0–3.0)
Eosinophils Absolute: 0.1 10*3/uL (ref 0.0–0.7)
Eosinophils Relative: 1.4 % (ref 0.0–5.0)
HCT: 34.6 % — ABNORMAL LOW (ref 36.0–46.0)
Hemoglobin: 11.5 g/dL — ABNORMAL LOW (ref 12.0–15.0)
Lymphocytes Relative: 32.6 % (ref 12.0–46.0)
Lymphs Abs: 2 10*3/uL (ref 0.7–4.0)
MCHC: 33.4 g/dL (ref 30.0–36.0)
MCV: 93.2 fl (ref 78.0–100.0)
Monocytes Absolute: 0.5 10*3/uL (ref 0.1–1.0)
Monocytes Relative: 7.9 % (ref 3.0–12.0)
Neutro Abs: 3.6 10*3/uL (ref 1.4–7.7)
Neutrophils Relative %: 57.5 % (ref 43.0–77.0)
Platelets: 203 10*3/uL (ref 150.0–400.0)
RBC: 3.71 Mil/uL — ABNORMAL LOW (ref 3.87–5.11)
RDW: 12.3 % (ref 11.5–15.5)
WBC: 6.3 10*3/uL (ref 4.0–10.5)

## 2022-08-23 LAB — HEPATIC FUNCTION PANEL
ALT: 9 U/L (ref 0–35)
AST: 15 U/L (ref 0–37)
Albumin: 3.7 g/dL (ref 3.5–5.2)
Alkaline Phosphatase: 95 U/L (ref 39–117)
Bilirubin, Direct: 0.2 mg/dL (ref 0.0–0.3)
Total Bilirubin: 0.7 mg/dL (ref 0.2–1.2)
Total Protein: 7.5 g/dL (ref 6.0–8.3)

## 2022-08-23 LAB — BASIC METABOLIC PANEL
BUN: 12 mg/dL (ref 6–23)
CO2: 31 mEq/L (ref 19–32)
Calcium: 9.4 mg/dL (ref 8.4–10.5)
Chloride: 100 mEq/L (ref 96–112)
Creatinine, Ser: 1.08 mg/dL (ref 0.40–1.20)
GFR: 52.37 mL/min — ABNORMAL LOW (ref >60.00)
Glucose, Bld: 73 mg/dL (ref 70–99)
Potassium: 3.9 mEq/L (ref 3.5–5.1)
Sodium: 136 mEq/L (ref 135–145)

## 2022-08-23 NOTE — Patient Instructions (Addendum)
You had the flu shot today  You will be contacted regarding the referral for: leg vein circulation testing, and mammogram, and dermatology  Please continue all other medications as before, and refills have been done if requested.  Please have the pharmacy call with any other refills you may need.  Please keep your appointments with your specialists as you may have planned  Please go to the LAB at the blood drawing area for the tests to be done  You will be contacted by phone if any changes need to be made immediately.  Otherwise, you will receive a letter about your results with an explanation, but please check with MyChart first.  Please remember to sign up for MyChart if you have not done so, as this will be important to you in the future with finding out test results, communicating by private email, and scheduling acute appointments online when needed.  Please make an Appointment to return in September 05, 2022, or sooner if needed

## 2022-08-23 NOTE — Telephone Encounter (Signed)
Called patient and advised her at her office visit next week we will go over the clotrimazole medication. I advised her to see if her PCP prescribed it to her and if she was unable to determine who gave it to her we will talk about it at visit.

## 2022-08-23 NOTE — Progress Notes (Signed)
Patient ID: Andrea Mason, female   DOB: 07/24/1951, 71 y.o.   MRN: [id number]        Chief Complaint: follow up for left leg pain and swelling x 3 wks, also recent covid infection, and right pinna lesion       HPI:  Andrea Mason is a 71 y.o. female here with c/o 3 wks onset left leg pain and swelling to the knee, mild to mod, persistent despite leg elevation, and no fever or skin changes.  Pt denies chest pain, increased sob or doe, wheezing, orthopnea, PND, increased LE swelling, palpitations, dizziness or syncope.  Due for mammogram.  Also has sebacous cyst occur to near the right tragus, asking derm referral as she cant wear 2 earring pairs as she usually does.   Pt denies polydipsia, polyuria, or new focal neuro s/s. Due for flu shot. Just had recent covid infection and laid up in bed for several days.  Due for mammogram as well       Wt Readings from Last 3 Encounters:  08/23/22 177 lb 8 oz (80.5 kg)  08/01/22 174 lb (78.9 kg)  07/18/22 178 lb 9.6 oz (81 kg)   BP Readings from Last 3 Encounters:  08/23/22 108/62  08/01/22 110/76  07/24/22 123/84         Past Medical History:  Diagnosis Date   Allergic rhinitis    Allergy    Asthma    Depression    DJD (degenerative joint disease), cervical    GERD (gastroesophageal reflux disease)    History of colonic polyps    Hypertension    Obesity    Primary hyperaldosteronism ([place]) 11/06/2013   Stroke ([place])    Syncope    Past Surgical History:  Procedure Laterality Date   COLONOSCOPY     FOOT SURGERY  1998   Implantable loop recorder placement  09/16/14   MDT LINQ implanted by Dr Rayann Heman for cryptogenic stroke, RIO II protocol   LOOP RECORDER REMOVAL N/A 10/02/2017   Procedure: LOOP RECORDER REMOVAL;  Surgeon: [name], MD;  Location: [facility];  Service: Cardiovascular;  Laterality: N/A;   NASAL POLYP SURGERY  07/2006   x 2 with Dr. [name]   NASAL SINUS SURGERY  07/2006   Dr. [name]   TUBAL LIGATION       reports that she has never smoked. She has been exposed to tobacco smoke. She has never used smokeless tobacco. She reports current alcohol use. She reports that she does not use drugs. family history includes Allergic rhinitis in her brother, brother, father, mother, and sister; Arrhythmia in her brother; Asthma in her mother and sister; Cancer in her mother; Colon cancer in her brother; Congenital heart disease in her brother; Diabetes in her sister; Heart attack in her brother; Lung disease in her son; Stroke in her brother. Allergies  Allergen Reactions   Iodine Nausea And Vomiting and Rash    Reaction: hot flashes   Ivp Dye [Iodinated Contrast Media] Nausea And Vomiting and Other (See Comments)    hot flashes   Promethazine-Codeine Nausea And Vomiting   Tramadol Other (See Comments)    Almost passed out   Current Outpatient Medications on File Prior to Visit  Medication Sig Dispense Refill   acetaminophen (TYLENOL) 325 MG tablet Take as directed per bottle instructions     albuterol (VENTOLIN HFA) 108 (90 Base) MCG/ACT inhaler Inhale 1-2 puffs into the lungs every 6 (six) hours as needed for wheezing or shortness  of breath. 1 each 11   amLODipine (NORVASC) 5 MG tablet Take 1 tablet (5 mg total) by mouth daily. 90 tablet 3   aspirin 325 MG tablet Take 325 mg by mouth daily.     Biotin 10000 MCG TABS Take 10,000 mcg by mouth daily.     budesonide-formoterol (SYMBICORT) 80-4.5 MCG/ACT inhaler Inhale 2 puffs into the lungs 2 (two) times daily. 10.2 g 11   cetirizine (ZYRTEC) 10 MG tablet Take 10 mg by mouth at bedtime.      Cholecalciferol (VITAMIN D3) 2000 units TABS Take 2,000 Units by mouth daily.     Cyanocobalamin (VITAMIN B-12 PO) Take by mouth daily.     dextromethorphan-guaiFENesin (MUCINEX DM) 30-600 MG 12hr tablet Take 1-2 tablets by mouth 2 (two) times daily as needed (for congestion/cough (WITH FLUTTER VALVE)).      fluticasone (FLONASE) 50 MCG/ACT nasal spray Place 2 sprays into  both nostrils daily. 16 g 5   gabapentin (NEURONTIN) 100 MG capsule Take 1 capsule (100 mg total) by mouth 3 (three) times daily. 90 capsule 3   Magnesium 250 MG TABS Take 1 tablet by mouth daily.     Mepolizumab (NUCALA) 100 MG/ML SOAJ Inject into the skin.     montelukast (SINGULAIR) 10 MG tablet Take 1 tablet (10 mg total) by mouth at bedtime. 30 tablet 11   oxymetazoline (AFRIN) 0.05 % nasal spray Place 2 sprays into both nostrils 2 (two) times daily as needed (nasal stuffiness and nasal pressure for 5 days).      pantoprazole (PROTONIX) 40 MG tablet Take 1 tablet (40 mg total) by mouth daily. 90 tablet 1   Respiratory Therapy Supplies (FLUTTER) DEVI Use as directed     sodium chloride (OCEAN) 0.65 % SOLN nasal spray Place 2 sprays into both nostrils as needed (dry nose).      Spacer/Aero-Holding Dorise Bullion by Does not apply route.     spironolactone (ALDACTONE) 25 MG tablet Take 1 tablet (25 mg total) by mouth daily. 90 tablet 2   SYMBICORT 80-4.5 MCG/ACT inhaler Inhale 2 puffs into the lungs 2 (two) times daily. 11 g 11   nystatin (MYCOSTATIN) 100000 UNIT/ML suspension Take 5 mLs (500,000 Units total) by mouth 4 (four) times daily. (Patient not taking: Reported on 08/23/2022) 60 mL 0   Current Facility-Administered Medications on File Prior to Visit  Medication Dose Route Frequency Provider Last Rate Last Admin   mepolizumab (NUCALA) injection 100 mg  100 mg Subcutaneous Q28 days Valentina Shaggy, MD   100 mg at 07/31/22 1532        ROS:  All others reviewed and negative.  Objective        PE:  BP 108/62 (BP Location: Left Arm, Patient Position: Sitting)   Pulse 77   Temp 98.2 F (36.8 C) (Oral)   Ht '4\' 11"'$  (1.499 m)   Wt 177 lb 8 oz (80.5 kg)   LMP  (LMP Unknown)   SpO2 98%   BMI 35.85 kg/m                 Constitutional: Pt appears in NAD               HENT: Head: NCAT.                Right Ear: External ear normal.                 Left Ear: External ear normal.  Eyes: . Pupils are equal, round, and reactive to light. Conjunctivae and EOM are normal               Nose: without d/c or deformity               Neck: Neck supple. Gross normal ROM               Cardiovascular: Normal rate and regular rhythm.                 Pulmonary/Chest: Effort normal and breath sounds without rales or wheezing.                Abd:  Soft, NT, ND, + BS, no organomegaly               Neurological: Pt is alert. At baseline orientation, motor grossly intact               Skin: Skin is warm. No rashes, no other new lesions, LE edema - 1-2+ to left leg below the knee only               Psychiatric: Pt behavior is normal without agitation   Micro: none  Cardiac tracings I have personally interpreted today:  none  Pertinent Radiological findings (summarize): none   Lab Results  Component Value Date   WBC 6.3 08/23/2022   HGB 11.5 (L) 08/23/2022   HCT 34.6 (L) 08/23/2022   PLT 203.0 08/23/2022   GLUCOSE 73 08/23/2022   CHOL 79 03/06/2022   TRIG 111.0 03/06/2022   HDL 47.00 03/06/2022   LDLCALC 10 03/06/2022   ALT 9 08/23/2022   AST 15 08/23/2022   NA 136 08/23/2022   K 3.9 08/23/2022   CL 100 08/23/2022   CREATININE 1.08 08/23/2022   BUN 12 08/23/2022   CO2 31 08/23/2022   TSH 1.66 03/06/2022   INR 1.0 12/26/2019   HGBA1C 6.1 03/06/2022   Assessment/Plan:  Andrea Mason is a 71 y.o. Black or African American [2] female with  has a past medical history of Allergic rhinitis, Allergy, Asthma, Depression, DJD (degenerative joint disease), cervical, GERD (gastroesophageal reflux disease), History of colonic polyps, Hypertension, Obesity, Primary hyperaldosteronism ([place]) (11/06/2013), Stroke ([place]), and Syncope.  Pain and swelling of left lower leg S/p recent covid infection, no hx of DVT but suspicous - for venous doppler stat  Lesion of right pinna Exam with small sebacous cyst , for derm referral  Essential hypertension BP Readings from Last 3  Encounters:  08/23/22 108/62  08/01/22 110/76  07/24/22 123/84   Stable, pt to continue medical treatment norvasc 5 mg qd,   Followup: Return in about 13 days (around 09/05/2022).  Cathlean Cower, MD 08/25/2022 9:44 PM [facility]

## 2022-08-24 ENCOUNTER — Telehealth: Payer: Self-pay

## 2022-08-24 ENCOUNTER — Ambulatory Visit (HOSPITAL_COMMUNITY)
Admission: RE | Admit: 2022-08-24 | Discharge: 2022-08-24 | Disposition: A | Discharge status: Home / Self Care | Payer: PPO | Source: Ambulatory Visit | Attending: Internal Medicine | Admitting: Internal Medicine

## 2022-08-24 ENCOUNTER — Other Ambulatory Visit: Payer: Self-pay | Admitting: Internal Medicine

## 2022-08-24 DIAGNOSIS — M79662 Pain in left lower leg: Secondary | ICD-10-CM | POA: Diagnosis not present

## 2022-08-24 DIAGNOSIS — M7989 Other specified soft tissue disorders: Secondary | ICD-10-CM | POA: Insufficient documentation

## 2022-08-24 MED ORDER — APIXABAN (ELIQUIS) VTE STARTER PACK (10MG AND 5MG): ORAL_TABLET | ORAL | 0 refills | Status: DC

## 2022-08-24 NOTE — Addendum Note (Signed)
Addended by: Biagio Borg on: 08/24/2022 05:18 PM   Modules accepted: Orders

## 2022-08-24 NOTE — Telephone Encounter (Signed)
Verbal report is that LE venous doppler + for DVT -  Ok for starter pack Eliquis - done erx  Please ask pt to f/u at 1 mo for more refill and exam

## 2022-08-24 NOTE — Telephone Encounter (Signed)
[name] from [facility] called with an DVT report. Patient positive for DVT from her iliac vein to her left calf. Dr.John informed and he gave next steps and advised patient to be released and will send an Rx for Eliquis.

## 2022-08-24 NOTE — Telephone Encounter (Signed)
Ok this is done 

## 2022-08-24 NOTE — Telephone Encounter (Signed)
Patient called in stating that she was on the way to work tonight and wants to know if it is safe to resume all activities or is it best to rest?

## 2022-08-24 NOTE — Telephone Encounter (Signed)
I was under the impression that the patient would start her shift tonight but she has gone to work and will get off at 11pm. She is requesting that you send the Eliquis to the [facility] on [address].

## 2022-08-24 NOTE — Telephone Encounter (Signed)
I would hold tonight, but ok to go tomorrow if she feels up to it as the eliquis is very fast to start as a blood thinner

## 2022-08-24 NOTE — Telephone Encounter (Signed)
Patient called and would like to know if it is safe for her to go to work tonight.

## 2022-08-25 ENCOUNTER — Encounter: Payer: Self-pay | Admitting: Internal Medicine

## 2022-08-25 DIAGNOSIS — H61101 Unspecified noninfective disorders of pinna, right ear: Secondary | ICD-10-CM | POA: Insufficient documentation

## 2022-08-25 NOTE — Assessment & Plan Note (Signed)
S/p recent covid infection, no hx of DVT but suspicous - for venous doppler stat

## 2022-08-25 NOTE — Assessment & Plan Note (Signed)
Exam with small sebacous cyst , for derm referral

## 2022-08-25 NOTE — Assessment & Plan Note (Signed)
BP Readings from Last 3 Encounters:  08/23/22 108/62  08/01/22 110/76  07/24/22 123/84   Stable, pt to continue medical treatment norvasc 5 mg qd,

## 2022-08-28 ENCOUNTER — Ambulatory Visit: Payer: PPO
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
## 2022-08-30 ENCOUNTER — Ambulatory Visit (INDEPENDENT_AMBULATORY_CARE_PROVIDER_SITE_OTHER): Payer: PPO | Admitting: Student

## 2022-08-30 ENCOUNTER — Encounter: Payer: Self-pay | Admitting: Student

## 2022-08-30 VITALS — BP 126/74 | HR 80 | Temp 97.8°F | Ht 59.0 in | Wt 181.0 lb

## 2022-08-30 DIAGNOSIS — I82412 Acute embolism and thrombosis of left femoral vein: Secondary | ICD-10-CM

## 2022-08-30 DIAGNOSIS — B4481 Allergic bronchopulmonary aspergillosis: Secondary | ICD-10-CM

## 2022-08-30 DIAGNOSIS — I639 Cerebral infarction, unspecified: Secondary | ICD-10-CM | POA: Diagnosis not present

## 2022-08-30 NOTE — Patient Instructions (Addendum)
-   symbicort 2 puffs twice daily rinse mouth after using - contiue singulair, flonase - albuterol as needed - Next nucala is 10/2 at 2:30pm - see you in 3 months or sooner if need be!

## 2022-09-01 ENCOUNTER — Ambulatory Visit (INDEPENDENT_AMBULATORY_CARE_PROVIDER_SITE_OTHER): Payer: PPO | Admitting: Internal Medicine

## 2022-09-01 VITALS — BP 120/72 | HR 75 | Temp 98.2°F | Ht 59.0 in | Wt 183.0 lb

## 2022-09-01 DIAGNOSIS — Z8673 Personal history of transient ischemic attack (TIA), and cerebral infarction without residual deficits: Secondary | ICD-10-CM

## 2022-09-01 DIAGNOSIS — I10 Essential (primary) hypertension: Secondary | ICD-10-CM

## 2022-09-01 DIAGNOSIS — I82402 Acute embolism and thrombosis of unspecified deep veins of left lower extremity: Secondary | ICD-10-CM | POA: Diagnosis not present

## 2022-09-01 DIAGNOSIS — N1831 Chronic kidney disease, stage 3a: Secondary | ICD-10-CM | POA: Diagnosis not present

## 2022-09-01 DIAGNOSIS — E7849 Other hyperlipidemia: Secondary | ICD-10-CM | POA: Diagnosis not present

## 2022-09-01 MED ORDER — APIXABAN 5 MG PO TABS: 5.0000 mg | ORAL_TABLET | Freq: Two times a day (BID) | ORAL | 5 refills | Status: DC

## 2022-09-01 MED ORDER — ASPIRIN 81 MG PO TBEC: 81.0000 mg | DELAYED_RELEASE_TABLET | Freq: Every day | ORAL | 12 refills | Status: AC

## 2022-09-01 NOTE — Progress Notes (Unsigned)
Patient ID: Andrea Mason, female   DOB: 07-24-1951, 71 y.o.   MRN: [id number]        Chief Complaint: follow up LLE acute DVT       HPI:  Andrea Mason is a 71 y.o. female here 1 wk after found to have acute LLE DVT, today with rather mod to severe swelling overall and a dull pain with large redness to leg below the knee; but no fever, chills, does not feel overly ill, and no red streaks or drainage.  Pt denies chest pain, increased sob or doe, wheezing, orthopnea, PND, increased LE swelling, palpitations, dizziness or syncope.   Pt denies polydipsia, polyuria, or new focal neuro s/s.   Still taking asa 325 in addition to the new elqiuis 5 bid.  No overt bleeding.  Pt has not been taking the statin due to Nucala, states she was told not to do this.        Wt Readings from Last 3 Encounters:  09/01/22 183 lb (83 kg)  08/30/22 181 lb (82.1 kg)  08/23/22 177 lb 8 oz (80.5 kg)   BP Readings from Last 3 Encounters:  09/01/22 120/72  08/30/22 126/74  08/23/22 108/62         Past Medical History:  Diagnosis Date   Allergic rhinitis    Allergy    Asthma    Depression    DJD (degenerative joint disease), cervical    GERD (gastroesophageal reflux disease)    History of colonic polyps    Hypertension    Obesity    Primary hyperaldosteronism ([place]) 11/06/2013   Stroke ([place])    Syncope    Past Surgical History:  Procedure Laterality Date   COLONOSCOPY     FOOT SURGERY  1998   Implantable loop recorder placement  09/16/14   MDT LINQ implanted by Dr Rayann Heman for cryptogenic stroke, RIO II protocol   LOOP RECORDER REMOVAL N/A 10/02/2017   Procedure: LOOP RECORDER REMOVAL;  Surgeon: [name], MD;  Location: [facility];  Service: Cardiovascular;  Laterality: N/A;   NASAL POLYP SURGERY  07/2006   x 2 with Dr. [name]   NASAL SINUS SURGERY  07/2006   Dr. [name]   TUBAL LIGATION      reports that she has never smoked. She has been exposed to tobacco smoke. She has never used  smokeless tobacco. She reports current alcohol use. She reports that she does not use drugs. family history includes Allergic rhinitis in her brother, brother, father, mother, and sister; Arrhythmia in her brother; Asthma in her mother and sister; Cancer in her mother; Colon cancer in her brother; Congenital heart disease in her brother; Diabetes in her sister; Heart attack in her brother; Lung disease in her son; Stroke in her brother. Allergies  Allergen Reactions   Iodine Nausea And Vomiting and Rash    Reaction: hot flashes   Ivp Dye [Iodinated Contrast Media] Nausea And Vomiting and Other (See Comments)    hot flashes   Promethazine-Codeine Nausea And Vomiting   Tramadol Other (See Comments)    Almost passed out   Current Outpatient Medications on File Prior to Visit  Medication Sig Dispense Refill   acetaminophen (TYLENOL) 325 MG tablet Take as directed per bottle instructions     albuterol (VENTOLIN HFA) 108 (90 Base) MCG/ACT inhaler Inhale 1-2 puffs into the lungs every 6 (six) hours as needed for wheezing or shortness of breath. 1 each 11   amLODipine (NORVASC) 5 MG tablet  Take 1 tablet (5 mg total) by mouth daily. 90 tablet 3   Biotin 10000 MCG TABS Take 10,000 mcg by mouth daily.     budesonide-formoterol (SYMBICORT) 80-4.5 MCG/ACT inhaler Inhale 2 puffs into the lungs 2 (two) times daily. 10.2 g 11   cetirizine (ZYRTEC) 10 MG tablet Take 10 mg by mouth at bedtime.      Cholecalciferol (VITAMIN D3) 2000 units TABS Take 2,000 Units by mouth daily.     Cyanocobalamin (VITAMIN B-12 PO) Take by mouth daily.     dextromethorphan-guaiFENesin (MUCINEX DM) 30-600 MG 12hr tablet Take 1-2 tablets by mouth 2 (two) times daily as needed (for congestion/cough (WITH FLUTTER VALVE)).      fluticasone (FLONASE) 50 MCG/ACT nasal spray Place 2 sprays into both nostrils daily. 16 g 5   gabapentin (NEURONTIN) 100 MG capsule Take 1 capsule (100 mg total) by mouth 3 (three) times daily. 90 capsule 3    Magnesium 250 MG TABS Take 1 tablet by mouth daily.     Mepolizumab (NUCALA) 100 MG/ML SOAJ Inject into the skin.     montelukast (SINGULAIR) 10 MG tablet Take 1 tablet (10 mg total) by mouth at bedtime. 30 tablet 11   nystatin (MYCOSTATIN) 100000 UNIT/ML suspension Take 5 mLs (500,000 Units total) by mouth 4 (four) times daily. 60 mL 0   oxymetazoline (AFRIN) 0.05 % nasal spray Place 2 sprays into both nostrils 2 (two) times daily as needed (nasal stuffiness and nasal pressure for 5 days).      pantoprazole (PROTONIX) 40 MG tablet Take 1 tablet (40 mg total) by mouth daily. 90 tablet 1   Respiratory Therapy Supplies (FLUTTER) DEVI Use as directed     sodium chloride (OCEAN) 0.65 % SOLN nasal spray Place 2 sprays into both nostrils as needed (dry nose).      Spacer/Aero-Holding Dorise Bullion by Does not apply route.     spironolactone (ALDACTONE) 25 MG tablet Take 1 tablet (25 mg total) by mouth daily. 90 tablet 2   SYMBICORT 80-4.5 MCG/ACT inhaler Inhale 2 puffs into the lungs 2 (two) times daily. 11 g 11   Current Facility-Administered Medications on File Prior to Visit  Medication Dose Route Frequency Provider Last Rate Last Admin   mepolizumab (NUCALA) injection 100 mg  100 mg Subcutaneous Q28 days Valentina Shaggy, MD   100 mg at 07/31/22 1532        ROS:  All others reviewed and negative.  Objective        PE:  BP 120/72 (BP Location: Left Arm, Patient Position: Sitting, Cuff Size: Large)   Pulse 75   Temp 98.2 F (36.8 C) (Oral)   Ht '4\' 11"'$  (1.499 m)   Wt 183 lb (83 kg)   LMP  (LMP Unknown)   SpO2 96%   BMI 36.96 kg/m                 Constitutional: Pt appears in NAD               HENT: Head: NCAT.                Right Ear: External ear normal.                 Left Ear: External ear normal.                Eyes: . Pupils are equal, round, and reactive to light. Conjunctivae and EOM are normal  Nose: without d/c or deformity               Neck: Neck supple.  Gross normal ROM               Cardiovascular: Normal rate and regular rhythm.                 Pulmonary/Chest: Effort normal and breath sounds without rales or wheezing.                Abd:  Soft, NT, ND, + BS, no organomegaly               Neurological: Pt is alert. At baseline orientation, motor grossly intact               Skin: Skin is warm. No rashes, no other new lesions, LE edema - LLE with 2+ swelling and redness but mild calf tenderness only               Psychiatric: Pt behavior is normal without agitation   Micro: none  Cardiac tracings I have personally interpreted today:  none  Pertinent Radiological findings (summarize): none   Lab Results  Component Value Date   WBC 6.3 08/23/2022   HGB 11.5 (L) 08/23/2022   HCT 34.6 (L) 08/23/2022   PLT 203.0 08/23/2022   GLUCOSE 73 08/23/2022   CHOL 79 03/06/2022   TRIG 111.0 03/06/2022   HDL 47.00 03/06/2022   LDLCALC 10 03/06/2022   ALT 9 08/23/2022   AST 15 08/23/2022   NA 136 08/23/2022   K 3.9 08/23/2022   CL 100 08/23/2022   CREATININE 1.08 08/23/2022   BUN 12 08/23/2022   CO2 31 08/23/2022   TSH 1.66 03/06/2022   INR 1.0 12/26/2019   HGBA1C 6.1 03/06/2022   Assessment/Plan:  Damon Baisch is a 71 y.o. Black or African American [2] female with  has a past medical history of Allergic rhinitis, Allergy, Asthma, Depression, DJD (degenerative joint disease), cervical, GERD (gastroesophageal reflux disease), History of colonic polyps, Hypertension, Obesity, Primary hyperaldosteronism ([place]) (11/06/2013), Stroke ([place]), and Syncope.  Leg DVT (deep venous thromboembolism), acute, left (HCC) First episode, Overall stable on eliquis 5 mg bid, pt to continue for total 6 months  Essential hypertension BP Readings from Last 3 Encounters:  09/01/22 120/72  08/30/22 126/74  08/23/22 108/62   Stable, pt to continue medical treatment norvasc 5 mg, aldactone 25 mg qd   CKD (chronic kidney disease) stage 3, GFR 30-59 ml/min  (HCC) Lab Results  Component Value Date   CREATININE 1.08 08/23/2022   Stable overall, cont to avoid nephrotoxins   History of stroke Pt also to reduce the asa t0 81 mg qd whil on eliquis x 6 mo only  Hyperlipidemia Also currently holding statin   Followup: Return in about 3 months (around 12/01/2022).  Cathlean Cower, MD 09/02/2022 1:14 PM [facility]

## 2022-09-01 NOTE — Patient Instructions (Addendum)
Ok to cancel your Tuesday appt at the front desk  Please take all new medication as prescribed - the Eliquis for 6 months total  Ok to decrease the Aspirin to 81 mg per day  Please continue all other medications as before, and refills have been done if requested.  Please have the pharmacy call with any other refills you may need.  Please keep your appointments with your specialists as you may have planned  Please make an Appointment to return in 3 months, or sooner if needed, also with Lab Appointment for testing done 3-5 days before at the [facility] (so this is for TWO appointments - please see the scheduling desk as you leave)

## 2022-09-02 ENCOUNTER — Encounter: Payer: Self-pay | Admitting: Internal Medicine

## 2022-09-02 DIAGNOSIS — I82402 Acute embolism and thrombosis of unspecified deep veins of left lower extremity: Secondary | ICD-10-CM | POA: Insufficient documentation

## 2022-09-02 DIAGNOSIS — Z8673 Personal history of transient ischemic attack (TIA), and cerebral infarction without residual deficits: Secondary | ICD-10-CM | POA: Insufficient documentation

## 2022-09-02 NOTE — Assessment & Plan Note (Signed)
First episode, Overall stable on eliquis 5 mg bid, pt to continue for total 6 months

## 2022-09-02 NOTE — Assessment & Plan Note (Signed)
Also currently holding statin

## 2022-09-02 NOTE — Assessment & Plan Note (Signed)
BP Readings from Last 3 Encounters:  09/01/22 120/72  08/30/22 126/74  08/23/22 108/62   Stable, pt to continue medical treatment norvasc 5 mg, aldactone 25 mg qd

## 2022-09-02 NOTE — Assessment & Plan Note (Signed)
Pt also to reduce the asa t0 81 mg qd whil on eliquis x 6 mo only

## 2022-09-02 NOTE — Assessment & Plan Note (Signed)
Lab Results  Component Value Date   CREATININE 1.08 08/23/2022   Stable overall, cont to avoid nephrotoxins

## 2022-09-04 ENCOUNTER — Ambulatory Visit (INDEPENDENT_AMBULATORY_CARE_PROVIDER_SITE_OTHER): Payer: PPO

## 2022-09-04 DIAGNOSIS — J4550 Severe persistent asthma, uncomplicated: Secondary | ICD-10-CM

## 2022-09-05 ENCOUNTER — Ambulatory Visit: Payer: PPO | Admitting: Internal Medicine

## 2022-09-05 ENCOUNTER — Telehealth: Payer: Self-pay

## 2022-09-05 NOTE — Telephone Encounter (Signed)
Nurse Assessment Nurse: Mariel Sleet, RN, Erasmo Downer Date/Time (Eastern Time): 09/02/2022 1:32:46 PM Confirm and document reason for call. If symptomatic, describe symptoms. ---Caller states that she is having loose stools since this morning and feels cold. Does the patient have any new or worsening symptoms? ---Yes Will a triage be completed? ---Yes Related visit to physician within the last 2 weeks? ---No Does the PT have any chronic conditions? (i.e. diabetes, asthma, this includes High risk factors for pregnancy, etc.) ---Yes List chronic conditions. ---asthma Is this a behavioral health or substance abuse call? ---No  Guidelines Guideline Title Affirmed Question Affirmed Notes Nurse Date/Time (Eastern Time) Diarrhea MILD-MODERATE diarrhea (e.g., 1-6 times / day more than normal) Don[place], RN, Erasmo Downer 09/02/2022 1:34:07 PM Disp. Time Eilene Ghazi Time) Disposition Final User 09/02/2022 1:31:14 PM Send to Urgent Queue [place], Helene Kelp 09/02/2022 1:39:23 [place], RN, Erasmo Downer  Final Disposition 09/02/2022 1:39:23 PM [place], RN, Laurin Coder Disagree/Comply Comply Caller Understands Yes PreDisposition Call Doctor Care Advice Given Per Guideline HOME CARE: * You should be able to treat this at home. REASSURANCE AND EDUCATION - DIARRHEA: * Diarrhea may be caused by a virus ('stomach flu') or a bacteria. Diarrhea is one of the body's way of getting rid of germs. * AVOID milk and dairy products if these make your diarrhea worse. * AVOID greasy, fatty or spicy foods. * AVOID caffeinated beverages. Reason: Caffeine is mildly dehydrating. * AVOID carbonated soft drinks (soda) as these can make your diarrhea worse. * SPORTS DRINKS: You can also drink half-strength sports drinks (e.g., Gatorade, Powerade) to help treat and prevent dehydration. Mix the sports drink half and half with water. * WATER: For mild to moderate diarrhea, water is often the best liquid to drink. You should  also eat some salty foods (e.g., potato chips, pretzels, saltine crackers). This is important to make sure you are getting enough salt, sugars, and fluids to meet your body's needs. * You can also eat bananas, yogurt, crackers, and soup. FOOD AND NUTRITION DURING MILD TO MODERATE DIARRHEA: EXPECTED COURSE: * Viral diarrhea lasts 4 to 7 days. CALL BACK IF: * Signs of dehydration occur (e.g., no urine over 12 hours, very dry mouth, lightheaded, etc.) * Diarrhea lasts over 7 days * You become worse CARE ADVICE given per Diarrhea (Adult) guideline

## 2022-09-18 ENCOUNTER — Other Ambulatory Visit: Payer: Self-pay | Admitting: Student

## 2022-09-18 ENCOUNTER — Ambulatory Visit (HOSPITAL_COMMUNITY): Payer: PPO | Attending: Cardiovascular Disease

## 2022-09-18 DIAGNOSIS — I340 Nonrheumatic mitral (valve) insufficiency: Secondary | ICD-10-CM

## 2022-09-18 DIAGNOSIS — E785 Hyperlipidemia, unspecified: Secondary | ICD-10-CM | POA: Insufficient documentation

## 2022-09-18 DIAGNOSIS — I639 Cerebral infarction, unspecified: Secondary | ICD-10-CM

## 2022-09-18 DIAGNOSIS — Z8249 Family history of ischemic heart disease and other diseases of the circulatory system: Secondary | ICD-10-CM | POA: Insufficient documentation

## 2022-09-18 DIAGNOSIS — Z86718 Personal history of other venous thrombosis and embolism: Secondary | ICD-10-CM | POA: Insufficient documentation

## 2022-09-18 DIAGNOSIS — I10 Essential (primary) hypertension: Secondary | ICD-10-CM | POA: Insufficient documentation

## 2022-09-18 DIAGNOSIS — I080 Rheumatic disorders of both mitral and aortic valves: Secondary | ICD-10-CM | POA: Diagnosis not present

## 2022-09-18 DIAGNOSIS — R609 Edema, unspecified: Secondary | ICD-10-CM | POA: Diagnosis not present

## 2022-09-18 LAB — ECHOCARDIOGRAM COMPLETE
Area-P 1/2: 4.06 cm2
S' Lateral: 2.2 cm

## 2022-09-21 ENCOUNTER — Telehealth: Payer: Self-pay

## 2022-09-21 ENCOUNTER — Emergency Department (HOSPITAL_COMMUNITY)
Admission: EM | Admit: 2022-09-21 | Discharge: 2022-09-21 | Disposition: A | Discharge status: Home / Self Care | Payer: PPO | Attending: Emergency Medicine | Admitting: Emergency Medicine

## 2022-09-21 ENCOUNTER — Other Ambulatory Visit: Payer: Self-pay

## 2022-09-21 ENCOUNTER — Emergency Department (HOSPITAL_BASED_OUTPATIENT_CLINIC_OR_DEPARTMENT_OTHER): Payer: PPO

## 2022-09-21 ENCOUNTER — Emergency Department (HOSPITAL_COMMUNITY): Payer: PPO

## 2022-09-21 DIAGNOSIS — Z79899 Other long term (current) drug therapy: Secondary | ICD-10-CM | POA: Diagnosis not present

## 2022-09-21 DIAGNOSIS — R079 Chest pain, unspecified: Secondary | ICD-10-CM | POA: Diagnosis not present

## 2022-09-21 DIAGNOSIS — I82502 Chronic embolism and thrombosis of unspecified deep veins of left lower extremity: Secondary | ICD-10-CM | POA: Diagnosis not present

## 2022-09-21 DIAGNOSIS — R0602 Shortness of breath: Secondary | ICD-10-CM | POA: Diagnosis not present

## 2022-09-21 DIAGNOSIS — I82402 Acute embolism and thrombosis of unspecified deep veins of left lower extremity: Secondary | ICD-10-CM

## 2022-09-21 DIAGNOSIS — I700 Atherosclerosis of aorta: Secondary | ICD-10-CM | POA: Diagnosis not present

## 2022-09-21 DIAGNOSIS — Z7901 Long term (current) use of anticoagulants: Secondary | ICD-10-CM | POA: Diagnosis not present

## 2022-09-21 DIAGNOSIS — I825Y2 Chronic embolism and thrombosis of unspecified deep veins of left proximal lower extremity: Secondary | ICD-10-CM

## 2022-09-21 DIAGNOSIS — M7989 Other specified soft tissue disorders: Secondary | ICD-10-CM

## 2022-09-21 DIAGNOSIS — Z7982 Long term (current) use of aspirin: Secondary | ICD-10-CM | POA: Diagnosis not present

## 2022-09-21 DIAGNOSIS — I824Y2 Acute embolism and thrombosis of unspecified deep veins of left proximal lower extremity: Secondary | ICD-10-CM | POA: Diagnosis not present

## 2022-09-21 LAB — COMPREHENSIVE METABOLIC PANEL
ALT: 9 U/L (ref 0–44)
AST: 17 U/L (ref 15–41)
Albumin: 3.6 g/dL (ref 3.5–5.0)
Alkaline Phosphatase: 82 U/L (ref 38–126)
Anion gap: 5 (ref 5–15)
BUN: 8 mg/dL (ref 8–23)
CO2: 28 mmol/L (ref 22–32)
Calcium: 9.2 mg/dL (ref 8.9–10.3)
Chloride: 106 mmol/L (ref 98–111)
Creatinine, Ser: 0.95 mg/dL (ref 0.44–1.00)
GFR, Estimated: >60 mL/min (ref >60)
Glucose, Bld: 86 mg/dL (ref 70–99)
Potassium: 4.1 mmol/L (ref 3.5–5.1)
Sodium: 139 mmol/L (ref 135–145)
Total Bilirubin: 1.1 mg/dL (ref 0.3–1.2)
Total Protein: 7.3 g/dL (ref 6.5–8.1)

## 2022-09-21 LAB — CBC WITH DIFFERENTIAL/PLATELET
Abs Immature Granulocytes: 0.01 10*3/uL (ref 0.00–0.07)
Basophils Absolute: 0 10*3/uL (ref 0.0–0.1)
Basophils Relative: 1 %
Eosinophils Absolute: 0.2 10*3/uL (ref 0.0–0.5)
Eosinophils Relative: 3 %
HCT: 34.1 % — ABNORMAL LOW (ref 36.0–46.0)
Hemoglobin: 10.8 g/dL — ABNORMAL LOW (ref 12.0–15.0)
Immature Granulocytes: 0 %
Lymphocytes Relative: 40 %
Lymphs Abs: 2 10*3/uL (ref 0.7–4.0)
MCH: 30 pg (ref 26.0–34.0)
MCHC: 31.7 g/dL (ref 30.0–36.0)
MCV: 94.7 fL (ref 80.0–100.0)
Monocytes Absolute: 0.4 10*3/uL (ref 0.1–1.0)
Monocytes Relative: 8 %
Neutro Abs: 2.4 10*3/uL (ref 1.7–7.7)
Neutrophils Relative %: 48 %
Platelets: 231 10*3/uL (ref 150–400)
RBC: 3.6 MIL/uL — ABNORMAL LOW (ref 3.87–5.11)
RDW: 12.1 % (ref 11.5–15.5)
WBC: 4.9 10*3/uL (ref 4.0–10.5)
nRBC: 0 % (ref 0.0–0.2)

## 2022-09-21 LAB — D-DIMER, QUANTITATIVE: D-Dimer, Quant: 1.64 ug/mL-FEU — ABNORMAL HIGH (ref 0.00–0.50)

## 2022-09-21 NOTE — Progress Notes (Signed)
LLE venous duplex has been completed.  Preliminary results given to Joanette Gula, PA-C  Results can be found under chart review under CV PROC. 09/21/2022 4:50 PM Jaykob Minichiello RVT, RDMS

## 2022-09-21 NOTE — Telephone Encounter (Signed)
Patient is currently at [facility].   Nurse Assessment Nurse: Toribio Harbour, RN, Joelene Millin Date/Time (Eastern Time): 09/21/2022 12:13:18 PM Confirm and document reason for call. If symptomatic, describe symptoms. ---Caller states that she has swelling in her left leg and foot. She also has a lot of pain and discoloration. She has been diagnosed with DVT in her left leg about a month ago. She has a clot in her leg and she is on Eliquis. Does the patient have any new or worsening symptoms? ---Yes Will a triage be completed? ---Yes Related visit to physician within the last 2 weeks? ---Yes Does the PT have any chronic conditions? (i.e. diabetes, asthma, this includes High risk factors for pregnancy, etc.) ---Yes List chronic conditions. ---DVT, pulmonary disease Is this a behavioral health or substance abuse call? ---No Guidelines Guideline Title Affirmed Question Affirmed Notes Nurse Date/Time (Eastern Time)  Leg Pain Entire foot is cool or blue in comparison to other side [place], RN, Joelene Millin 09/21/2022 12:17:43 PM Disp. Time Eilene Ghazi Time) Disposition Final User 09/21/2022 12:23:07 PM Go to ED Now Yes Toribio Harbour, RN, Joelene Millin Final Disposition 09/21/2022 12:23:07 PM Go to ED Now Yes Toribio Harbour, RN, Renea Ee Disagree/Comply Comply Caller Understands Yes PreDisposition Call Doctor Care Advice Given Per Guideline GO TO ED NOW: * You need to be seen in the Emergency Department. * Go to the ED at ___________ [place] now. Drive carefully. BRING MEDICINES: * Bring a list of your current medicines when you go to the Emergency Department (ER). * Bring the pill bottles too. This will help the doctor (or NP/PA) to make certain you are taking the right medicines and the right dose. CARE ADVICE given per Leg Pain (Adult) guideline. Referrals [facility] - ED

## 2022-09-21 NOTE — Discharge Instructions (Addendum)
Put a referral to our vascular vein clinic.  They should reach out to you in the next 3 days to schedule an appointment for follow-up with a vein specialist.  I included their phone number

## 2022-09-21 NOTE — ED Triage Notes (Signed)
Pt reports known DVT to left leg 1 month ago and placed on eliquis. Pt reports 2 weeks ago started having increased swelling to left leg with discoloration and shob.

## 2022-09-21 NOTE — ED Provider Triage Note (Signed)
Emergency Medicine Provider Triage Evaluation Note  Andrea Mason , a 71 y.o. female  was evaluated in triage.  Pt complains of leg pain and shortness of breath.  Pain is located in in the left leg.  Begins at the mid thigh and extends down to the toes.  Patient was diagnosed with a DVT in that left leg a month ago.  Started on Eliquis.  Noticed her lower leg is gotten much darker in color although swelling has improved somewhat since she was diagnosed.  The change in color of her skin prompted her to be evaluated.  Shortness of breath is been going on for 2 weeks now.  History of asthma.  In the last 2 days has been much worse.  She has had to use her rescue inhaler multiple times.  Seems to be worse with exertion.  Review of Systems  Positive: See above Negative: See above  Physical Exam  BP 137/77 (BP Location: Left Arm)   Pulse 86   Temp 98.5 F (36.9 C) (Oral)   Resp 18   LMP  (LMP Unknown)   SpO2 98%  Gen:   Awake, no distress   Resp:  Normal effort  MSK:   Moves extremities without difficulty  Other:    Medical Decision Making  Medically screening exam initiated at 3:41 PM.  Appropriate orders placed.  Athena Baltz was informed that the remainder of the evaluation will be completed by another provider, this initial triage assessment does not replace that evaluation, and the importance of remaining in the ED until their evaluation is complete.  Work up initiated   Harriet Pho, PA-C 09/21/22 1547

## 2022-09-21 NOTE — ED Provider Notes (Signed)
[facility] DEPT Provider Note   CSN: [id number] Arrival date & time: 09/21/22  1504     History  CC: Left leg swelling   Andrea Mason is a 71 y.o. female presenting to ED with complaint of left leg pain and swelling.  The patient was diagnosed with a large left-sided lower extremity DVT on 08/24/22, nearly 1 month ago, started on Eliquis.  She reports she has had continued swelling in her left leg.  The pain has been fairly minimal.  She denies numbness in the leg.  She has noted some ruddy discoloration of the leg below the knee.  She denies any chest pain, shortness of breath, difficulty breathing to me.  She has been compliant with her Eliquis.  She has not seen a vascular vein specialist.  HPI     Home Medications Prior to Admission medications   Medication Sig Start Date End Date Taking? Authorizing Provider  acetaminophen (TYLENOL) 325 MG tablet Take as directed per bottle instructions    [provider]  albuterol (VENTOLIN HFA) 108 (90 Base) MCG/ACT inhaler Inhale 1-2 puffs into the lungs every 6 (six) hours as needed for wheezing or shortness of breath. 05/23/22   Maryjane Hurter, MD  amLODipine (NORVASC) 5 MG tablet Take 1 tablet (5 mg total) by mouth daily. 03/28/22 03/28/23  Biagio Borg, MD  apixaban (ELIQUIS) 5 MG TABS tablet Take 1 tablet (5 mg total) by mouth 2 (two) times daily. 09/01/22   Biagio Borg, MD  aspirin EC 81 MG tablet Take 1 tablet (81 mg total) by mouth daily. Swallow whole. 09/01/22   Biagio Borg, MD  Biotin 10000 MCG TABS Take 10,000 mcg by mouth daily.    [provider]  budesonide-formoterol (SYMBICORT) 80-4.5 MCG/ACT inhaler Inhale 2 puffs into the lungs 2 (two) times daily. 07/05/22   Tanda Rockers, MD  cetirizine (ZYRTEC) 10 MG tablet Take 10 mg by mouth at bedtime.     [provider]  Cholecalciferol (VITAMIN D3) 2000 units TABS Take 2,000 Units by mouth daily.    [provider]  Cyanocobalamin (VITAMIN B-12 PO) Take by mouth daily.    [provider]  dextromethorphan-guaiFENesin (MUCINEX DM) 30-600 MG 12hr tablet Take 1-2 tablets by mouth 2 (two) times daily as needed (for congestion/cough (WITH FLUTTER VALVE)).     [provider]  fluticasone (FLONASE) 50 MCG/ACT nasal spray Place 2 sprays into both nostrils daily. 06/10/21   Tanda Rockers, MD  gabapentin (NEURONTIN) 100 MG capsule Take 1 capsule (100 mg total) by mouth 3 (three) times daily. 07/14/22   Biagio Borg, MD  Magnesium 250 MG TABS Take 1 tablet by mouth daily.    [provider]  Mepolizumab (NUCALA) 100 MG/ML SOAJ Inject into the skin.    [provider]  montelukast (SINGULAIR) 10 MG tablet Take 1 tablet (10 mg total) by mouth at bedtime. 06/13/22   Tanda Rockers, MD  nystatin (MYCOSTATIN) 100000 UNIT/ML suspension Take 5 mLs (500,000 Units total) by mouth 4 (four) times daily. 08/17/22   Valentina Shaggy, MD  oxymetazoline (AFRIN) 0.05 % nasal spray Place 2 sprays into both nostrils 2 (two) times daily as needed (nasal stuffiness and nasal pressure for 5 days).     [provider]  pantoprazole (PROTONIX) 40 MG tablet Take 1 tablet (40 mg total) by mouth daily. 07/17/22   Biagio Borg, MD  Respiratory Therapy Supplies (FLUTTER) DEVI  Use as directed    [provider]  sodium chloride (OCEAN) 0.65 % SOLN nasal spray Place 2 sprays into both nostrils as needed (dry nose).     [provider]  Spacer/Aero-Holding Dorise Bullion by Does not apply route.    [provider]  spironolactone (ALDACTONE) 25 MG tablet Take 1 tablet (25 mg total) by mouth daily. 07/05/22   Biagio Borg, MD  SYMBICORT 80-4.5 MCG/ACT inhaler Inhale 2 puffs into the lungs 2 (two) times daily. 03/24/22   Maryjane Hurter, MD      Allergies    Iodine, Ivp dye [iodinated contrast media], Promethazine-codeine, and Tramadol    Review of Systems   Review of  Systems  Physical Exam Updated Vital Signs BP (!) 143/87   Pulse 86   Temp 98.6 F (37 C) (Oral)   Resp 19   LMP  (LMP Unknown)   SpO2 98%  Physical Exam Constitutional:      General: She is not in acute distress. HENT:     Head: Normocephalic and atraumatic.  Eyes:     Conjunctiva/sclera: Conjunctivae normal.     Pupils: Pupils are equal, round, and reactive to light.  Cardiovascular:     Rate and Rhythm: Normal rate and regular rhythm.  Pulmonary:     Effort: Pulmonary effort is normal. No respiratory distress.  Abdominal:     General: There is no distension.     Tenderness: There is no abdominal tenderness.  Skin:    General: Skin is warm and dry.     Comments: Swelling and some overlying warmth of the left lower extremity from the knee down through the foot.  Pulses brisk and intact bilaterally.  Sensation intact  Neurological:     General: No focal deficit present.     Mental Status: She is alert. Mental status is at baseline.  Psychiatric:        Mood and Affect: Mood normal.        Behavior: Behavior normal.     ED Results / Procedures / Treatments   Labs (all labs ordered are listed, but only abnormal results are displayed) Labs Reviewed  CBC WITH DIFFERENTIAL/PLATELET - Abnormal; Notable for the following components:      Result Value   RBC 3.60 (*)    Hemoglobin 10.8 (*)    HCT 34.1 (*)    All other components within normal limits  D-DIMER, QUANTITATIVE - Abnormal; Notable for the following components:   D-Dimer, Quant 1.64 (*)    All other components within normal limits  COMPREHENSIVE METABOLIC PANEL    EKG None  Radiology DG Chest 2 View  Result Date: 09/21/2022 CLINICAL DATA:  Shortness of breath. Left leg pain. The patient takes Eliquis. History of asthma. EXAM: CHEST - 2 VIEW COMPARISON:  08/09/2020 FINDINGS: Atherosclerotic calcification of the aortic arch. Stable retrocardiac scarring in the left lower lobe. Currently, substantial airway  thickening is not identified radiographically. The lungs appear otherwise clear. Thoracic spondylosis.  No blunting of the costophrenic angles. IMPRESSION: 1. Stable scarring in the left lower lobe. No acute findings. 2. Thoracic spondylosis. 3. Atherosclerotic calcification of the aortic arch. Electronically Signed   By: Van Clines M.D.   On: 09/21/2022 17:20   VAS [place] LOWER EXTREMITY VENOUS (DVT) (7a-7p)  Result Date: 09/21/2022  Lower Venous DVT Study Patient Name:  JORDANA DUGUE  Date of Exam:   09/21/2022 Medical Rec #: [id number]       Accession #:  [id number] Date of Birth: Jan 30, 1951        Patient Gender: F Patient Age:   68 years Exam Location:  [facility] Procedure:      VAS [place] LOWER EXTREMITY VENOUS (DVT) Referring Phys: Joanette Gula --------------------------------------------------------------------------------  Indications: Darkening of skin post DVT diagnosis one month ago, and SOB.  Risk Factors: DVT LLE 08/24/22 (EIV, CFV, SFJ, FV, PopV, PTV, PeroV). Anticoagulation: Eliquis. Limitations: Poor ultrasound/tissue interface. Comparison Study: Previous exam on 08/24/22 was positive for DVT in LLE                   (extensive) Performing Technologist: Rogelia Rohrer RVT, RDMS  Examination Guidelines: A complete evaluation includes B-mode imaging, spectral Doppler, color Doppler, and power Doppler as needed of all accessible portions of each vessel. Bilateral testing is considered an integral part of a complete examination. Limited examinations for reoccurring indications may be performed as noted. The reflux portion of the exam is performed with the patient in reverse Trendelenburg.  +-----+---------------+---------+-----------+----------+--------------+ RIGHTCompressibilityPhasicitySpontaneityPropertiesThrombus Aging +-----+---------------+---------+-----------+----------+--------------+ CFV  Full           Yes      Yes                                  +-----+---------------+---------+-----------+----------+--------------+   +---------+---------------+---------+-----------+----------+-------------------+ LEFT     CompressibilityPhasicitySpontaneityPropertiesThrombus Aging      +---------+---------------+---------+-----------+----------+-------------------+ CFV      Partial        Yes      Yes                  Age Indeterminate   +---------+---------------+---------+-----------+----------+-------------------+ SFJ      Partial                                      Age Indeterminate   +---------+---------------+---------+-----------+----------+-------------------+ FV Prox  None           No       No                   Age Indeterminate   +---------+---------------+---------+-----------+----------+-------------------+ FV Mid   None           No       No                   Age Indeterminate   +---------+---------------+---------+-----------+----------+-------------------+ FV DistalPartial        Yes      Yes                  Age Indeterminate   +---------+---------------+---------+-----------+----------+-------------------+ PFV      None           No       No                   Age Indeterminate   +---------+---------------+---------+-----------+----------+-------------------+ POP      Partial        Yes      Yes                  Age Indeterminate   +---------+---------------+---------+-----------+----------+-------------------+ PTV  Not well visualized +---------+---------------+---------+-----------+----------+-------------------+ PERO     Partial        No       No                   Age Indeterminate   +---------+---------------+---------+-----------+----------+-------------------+ EIV      Full           Yes      Yes                                      +---------+---------------+---------+-----------+----------+-------------------+    Summary:  RIGHT: - No evidence of common femoral vein obstruction.  LEFT: - Findings consistent with age indeterminate deep vein thrombosis involving the left common femoral vein, SF junction, left femoral vein, left proximal profunda vein, left popliteal vein, and left peroneal veins. - No cystic structure found in the popliteal fossa.  *See table(s) above for measurements and observations.    Preliminary     Procedures Procedures    Medications Ordered in ED Medications - No data to display  ED Course/ Medical Decision Making/ A&P                           Medical Decision Making  Patient is here with ongoing pain and swelling in her left lower extremity.  This is not consistent with phlegmasia cerulea dolens, there is no gross discoloration or pain out of proportion or loss of pulses or cap refill to suggest vascular crisis.  Repeat ultrasound shows persistent large left-sided deep venous thrombosis.  I will refer her to the vascular vein clinic and specialist.  I advised that she maintain on Eliquis.  We discussed elevating her leg at home.  She is not having worsening pain today, she is only looking for reassurance she tells me.  I do not see signs or symptoms of DVT at this time.  I personally reviewed and interpreted her labs show no acute emergency.  Her x-ray shows no focal infiltrates.  I do not believe we are needing further imaging at this time        Final Clinical Impression(s) / ED Diagnoses Final diagnoses:  Chronic deep vein thrombosis (DVT) of proximal vein of left lower extremity ([place])    Rx / DC Orders ED Discharge Orders          Ordered    Ambulatory referral to Vascular Surgery       Comments: Large left lower DVT, skin discoloration   09/21/22 2013              Wyvonnia Dusky, MD 09/21/22 2014

## 2022-09-28 NOTE — Progress Notes (Signed)
Office Note     CC:  08/24/22 Left leg DVT Requesting Provider:  Biagio Borg, MD  HPI: Andrea Mason is a 71 y.o. (05/03/1951) female who presents at the request of Biagio Borg, MD for evaluation of peripheral extremity DVT.  The DVT first occurred in September, with follow-up study in October.  She is currently being anticoagulated on Eliquis. She was recently seen in the ED with darker skin discoloration at the level of the calves which was concerning to her.  She notes less pain, but continues to have some thigh tenderness with ambulation.    Venous symptoms include: positive if (X) [  ] aching [  ] heavy [  ] tired  [  ] throbbing [  ] burning  [  ] itching [  ]swelling [  ] bleeding [  ] ulcer  Onset/duration:  ***  Occupation:  *** Aggravating factors: (sitting, standing) Alleviating factors: (elevation) Compression:  *** Helps:  *** Pain medications:  *** Previous vein procedures:  *** History of DVT:  ***   The pt *** on a statin for cholesterol management.  The pt *** on a daily aspirin.   Other AC:  *** The pt *** on *** for hypertension.   The pt *** diabetic.  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 [name]   NASAL SINUS  SURGERY  07/2006   Dr. [name]   TUBAL LIGATION      Social History   Socioeconomic History   Marital status: Widowed    Spouse name: Not on file   Number of children: 3   Years of education: Not on file   Highest education level: Not on file  Occupational History   Occupation: Publishing copy    Comment: [employer]  Tobacco Use   Smoking status: Never    Passive exposure: Current (on sister's clothing bc sister is a smoker)   Smokeless tobacco: Never  Vaping Use   Vaping Use: Never used  Substance and Sexual Activity   Alcohol use: Yes    Comment: socially wine   Drug use: No   Sexual activity: Not Currently    Birth control/protection: Post-menopausal  Other Topics Concern   Not on file  Social History Narrative   Widowed, husband died in 02-26-2006   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   Patient lives alone.   Social Determinants of Health   Financial Resource Strain: Not on file  Food Insecurity: Not on file  Transportation Needs: Not on file  Physical Activity: Not on file  Stress: Not on file  Social Connections: Not on file  Intimate Partner Violence: Not on file   *** Family History  Problem Relation Age of Onset   Asthma Mother    Allergic rhinitis Mother    Cancer Mother    Allergic rhinitis Father    Asthma Sister    Allergic rhinitis Sister    Diabetes Sister    Allergic rhinitis Brother    Colon cancer Brother    Stroke Brother    Arrhythmia Brother    Allergic rhinitis Brother    Congenital heart disease Brother    Heart attack Brother    Lung disease Son        Sarcoid vs ARDS related fibrosis, required 2 double lung transplants   Esophageal cancer Neg Hx    Rectal cancer Neg Hx    Stomach cancer Neg Hx     Current Outpatient Medications  Medication Sig Dispense Refill   acetaminophen (TYLENOL) 325 MG tablet Take as directed per bottle instructions     albuterol  (VENTOLIN HFA) 108 (90 Base) MCG/ACT inhaler Inhale 1-2 puffs into the lungs every 6 (six) hours as needed for wheezing or shortness of breath. 1 each 11   amLODipine (NORVASC) 5 MG tablet Take 1 tablet (5 mg total) by mouth daily. 90 tablet 3   apixaban (ELIQUIS) 5 MG TABS tablet Take 1 tablet (5 mg total) by mouth 2 (two) times daily. 60 tablet 5   aspirin EC 81 MG tablet Take 1 tablet (81 mg total) by mouth daily. Swallow whole. 30 tablet 12   Biotin 10000 MCG TABS Take 10,000 mcg by mouth daily.     budesonide-formoterol (SYMBICORT) 80-4.5 MCG/ACT inhaler Inhale 2 puffs into the lungs 2 (two) times daily. 10.2 g 11   cetirizine (ZYRTEC) 10 MG tablet Take 10 mg by mouth at bedtime.      Cholecalciferol (VITAMIN D3) 2000 units TABS Take 2,000 Units by mouth daily.     Cyanocobalamin (VITAMIN B-12 PO) Take by mouth daily.     dextromethorphan-guaiFENesin (MUCINEX DM) 30-600 MG 12hr tablet Take 1-2 tablets by mouth 2 (two) times daily as needed (for congestion/cough (WITH FLUTTER VALVE)).      fluticasone (FLONASE) 50 MCG/ACT nasal spray Place 2 sprays into both nostrils daily. 16 g 5   gabapentin (NEURONTIN) 100 MG capsule Take 1 capsule (100 mg total) by mouth 3 (three) times daily. 90 capsule 3   Magnesium 250 MG TABS Take 1 tablet by mouth daily.     Mepolizumab (NUCALA) 100 MG/ML SOAJ Inject into the skin.     montelukast (SINGULAIR) 10 MG tablet Take 1 tablet (10 mg total) by mouth at bedtime. 30 tablet 11   nystatin (MYCOSTATIN) 100000 UNIT/ML suspension Take 5 mLs (500,000 Units total) by mouth 4 (four) times daily. 60 mL 0   oxymetazoline (AFRIN) 0.05 % nasal spray Place 2 sprays into both nostrils 2 (two) times daily as needed (nasal stuffiness and nasal pressure for 5 days).      pantoprazole (PROTONIX) 40 MG tablet Take 1 tablet (40 mg total) by mouth daily. 90 tablet 1   Respiratory Therapy Supplies (FLUTTER) DEVI Use as directed     sodium chloride (OCEAN) 0.65 % SOLN nasal spray  Place 2 sprays into both nostrils as needed (dry nose).      Spacer/Aero-Holding Dorise Bullion by Does not apply route.     spironolactone (ALDACTONE) 25 MG tablet Take 1  tablet (25 mg total) by mouth daily. 90 tablet 2   SYMBICORT 80-4.5 MCG/ACT inhaler Inhale 2 puffs into the lungs 2 (two) times daily. 11 g 11   Current Facility-Administered Medications  Medication Dose Route Frequency Provider Last Rate Last Admin   mepolizumab (NUCALA) injection 100 mg  100 mg Subcutaneous Q28 days Valentina Shaggy, MD   100 mg at 09/04/22 1422    Allergies  Allergen Reactions   Iodine Nausea And Vomiting and Rash    Reaction: hot flashes   Ivp Dye [Iodinated Contrast Media] Nausea And Vomiting and Other (See Comments)    hot flashes   Promethazine-Codeine Nausea And Vomiting   Tramadol Other (See Comments)    Almost passed out     REVIEW OF SYSTEMS:  *** '[X]'$  denotes positive finding, '[ ]'$  denotes negative finding Cardiac  Comments:  Chest pain or chest pressure:    Shortness of breath upon exertion:    Short of breath when lying flat:    Irregular heart rhythm:        Vascular    Pain in calf, thigh, or hip brought on by ambulation:    Pain in feet at night that wakes you up from your sleep:     Blood clot in your veins:    Leg swelling:         Pulmonary    Oxygen at home:    Productive cough:     Wheezing:         Neurologic    Sudden weakness in arms or legs:     Sudden numbness in arms or legs:     Sudden onset of difficulty speaking or slurred speech:    Temporary loss of vision in one eye:     Problems with dizziness:         Gastrointestinal    Blood in stool:     Vomited blood:         Genitourinary    Burning when urinating:     Blood in urine:        Psychiatric    Major depression:         Hematologic    Bleeding problems:    Problems with blood clotting too easily:        Skin    Rashes or ulcers:        Constitutional    Fever or chills:       PHYSICAL EXAMINATION:  There were no vitals filed for this visit.  General:  WDWN in NAD; vital signs documented above Gait: Not observed HENT: WNL, normocephalic Pulmonary: normal non-labored breathing , without Rales, rhonchi,  wheezing Cardiac: {Desc; regular/irreg:14544} HR, without  Murmurs {With/Without:20273} carotid bruit*** Abdomen: soft, NT, no masses Skin: {With/Without:20273} rashes Vascular Exam/Pulses:  Right Left  Radial {Exam; arterial pulse strength 0-4:30167} {Exam; arterial pulse strength 0-4:30167}  Ulnar {Exam; arterial pulse strength 0-4:30167} {Exam; arterial pulse strength 0-4:30167}  Femoral {Exam; arterial pulse strength 0-4:30167} {Exam; arterial pulse strength 0-4:30167}  Popliteal {Exam; arterial pulse strength 0-4:30167} {Exam; arterial pulse strength 0-4:30167}  DP {Exam; arterial pulse strength 0-4:30167} {Exam; arterial pulse strength 0-4:30167}  PT {Exam; arterial pulse strength 0-4:30167} {Exam; arterial pulse strength 0-4:30167}   Extremities: {With/Without:20273} ischemic changes, {With/Without:20273} Gangrene , {With/Without:20273} cellulitis; {With/Without:20273} open wounds;  Musculoskeletal: no muscle wasting or atrophy  Neurologic: A&O X 3;  No focal weakness or paresthesias are detected Psychiatric:  The pt has {Desc; normal/abnormal:11317::"Normal"} affect.   Non-Invasive Vascular Imaging:   ***  ASSESSMENT/PLAN:: 71 y.o. female presenting with ***  No EIV involvement, unprovoked, [place], [place], [place], here ***   Broadus John, MD [facility] [phone]

## 2022-09-29 ENCOUNTER — Ambulatory Visit (INDEPENDENT_AMBULATORY_CARE_PROVIDER_SITE_OTHER): Payer: PPO | Admitting: Vascular Surgery

## 2022-09-29 ENCOUNTER — Encounter: Payer: Self-pay | Admitting: Vascular Surgery

## 2022-09-29 VITALS — BP 112/73 | HR 73 | Temp 98.1°F | Resp 20 | Ht 59.0 in | Wt 178.6 lb

## 2022-09-29 DIAGNOSIS — M7989 Other specified soft tissue disorders: Secondary | ICD-10-CM

## 2022-09-29 DIAGNOSIS — I82412 Acute embolism and thrombosis of left femoral vein: Secondary | ICD-10-CM | POA: Diagnosis not present

## 2022-10-02 ENCOUNTER — Ambulatory Visit (INDEPENDENT_AMBULATORY_CARE_PROVIDER_SITE_OTHER): Payer: PPO

## 2022-10-02 DIAGNOSIS — J4550 Severe persistent asthma, uncomplicated: Secondary | ICD-10-CM

## 2022-10-04 ENCOUNTER — Other Ambulatory Visit: Payer: Self-pay

## 2022-10-04 DIAGNOSIS — I82412 Acute embolism and thrombosis of left femoral vein: Secondary | ICD-10-CM

## 2022-10-05 ENCOUNTER — Telehealth: Payer: Self-pay | Admitting: Internal Medicine

## 2022-10-05 NOTE — Telephone Encounter (Signed)
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   # LLE DVT, ?related to covid infection, ?unprovoked She thinks LLE swelling preceded covid infection   # cryptogenic stroke Does not appear to have had bubble study in past   Plan: - symbicort 80 2 puffs twice daily rinse mouth after using - may need to increase ICS component when comes off of prednisone - singulair, flonase - nucala - consider antifungal if exacerbations despite nucala - albuterol as needed - TTE with bubble - would switch to baby aspirin 81 mg daily while on eliquis - eliquis for at least 3 months but may warrant consideration of indefinite treatment if it's not clear there's a provoking factor as long as she's not running into bleeding issues - see you in 3 months or sooner if need be!     RTC 3 months     Maryjane Hurter, MD [facility] 08/30/2022 1:47 PM       Patient Instructions by Maryjane Hurter, MD at 08/30/2022 1:30 PM  Author: Maryjane Hurter, MD Author Type: Physician Filed: 08/30/2022  1:59 PM  Note Status: Addendum Mickle Mallory: Cosign Not Required Encounter Date: 08/30/2022  Editor: Maryjane Hurter, MD (Physician)      Prior Versions: 1. Maryjane Hurter, MD (Physician) at 08/30/2022  1:54 PM - Addendum   2. Maryjane Hurter, MD (Physician) at 08/30/2022  1:54 PM - Addendum   3. Maryjane Hurter, MD (Physician) at 08/30/2022  1:53 PM - Addendum   4. Maryjane Hurter, MD (Physician) at 08/30/2022  1:53 PM - Signed  - symbicort 2 puffs twice daily rinse mouth after using - contiue singulair, flonase - albuterol as needed - Next nucala is 10/2 at 2:30pm - see you in 3 months or sooner if need be!       Orthostatic Vitals Recorded  in This Encounter   08/30/2022 1335     BP Location: Left Arm  Cuff Size: Normal   Instructions   Return in about 3 months (around 11/29/2022). - symbicort 2 puffs twice daily rinse mouth after using - contiue singulair, flonase - albuterol as needed - Next nucala is 10/2 at 2:30pm - see you in 3 months or sooner if need be!      Was appointment offered to patient (explain)?  no   Reason for call: Called and spoke with patient, she has been getting the injection since August.  For the past couple of weeks she is having coughing and choking at night, she works in the evening.  She coughed all the way home and while she is sleeping.  She is taking the mucinex DM.  She has been having runny nose and eyes for a while now.  Denies any fever, chills or body aches.  She is using the Symbicort and albuterol as prescribed.  She states she has seen Dr. Melvyn Novas for years and years, but the past couple of times she has seen Dr. Verlee Monte for a fungus in her lung.  I advised her that Dr. Verlee Monte is out of the office today.  She asked that I send a message to Dr. Melvyn Novas.  Dr. Melvyn Novas, please advise.  (examples of things to ask: : When did symptoms  start? Fever? Cough? Productive? Color to sputum? More sputum than usual? Wheezing? Have you needed increased oxygen? Are you taking your respiratory medications? What over the counter measures have you tried?)  Allergies  Allergen Reactions   Iodine Nausea And Vomiting and Rash    Reaction: hot flashes   Ivp Dye [Iodinated Contrast Media] Nausea And Vomiting and Other (See Comments)    hot flashes   Promethazine-Codeine Nausea And Vomiting   Tramadol Other (See Comments)    Almost passed out    Immunization History  Administered Date(s) Administered   Fluad Quad(high Dose 65+) 08/23/2022   Influenza Split 09/03/2012   Influenza Whole 09/27/2006, 08/23/2007, 11/16/2009, 08/25/2010   Influenza,inj,Quad PF,6+ Mos 08/21/2013, 08/06/2014, 10/05/2015    PFIZER(Purple Top)SARS-COV-2 Vaccination 03/04/2020, 03/29/2020   Pneumococcal Conjugate-13 12/02/2014   Pneumococcal Polysaccharide-23 06/03/2019   Tdap 05/06/2013, 08/25/2015   Zoster, Live 05/06/2013

## 2022-10-05 NOTE — Telephone Encounter (Signed)
Very complicated pt - will need ov with all meds in hand to sort out what to do next - nothing to offer over the phone

## 2022-10-06 ENCOUNTER — Encounter: Payer: Self-pay | Admitting: Internal Medicine

## 2022-10-06 ENCOUNTER — Ambulatory Visit: Payer: PPO | Admitting: Internal Medicine

## 2022-10-06 VITALS — BP 124/68 | HR 84 | Temp 98.6°F | Ht 61.5 in | Wt 177.4 lb

## 2022-10-06 DIAGNOSIS — J45991 Cough variant asthma: Secondary | ICD-10-CM | POA: Diagnosis not present

## 2022-10-06 MED ORDER — AMOXICILLIN-POT CLAVULANATE 875-125 MG PO TABS: 1.0000 | ORAL_TABLET | Freq: Two times a day (BID) | ORAL | 11 refills | Status: DC

## 2022-10-06 MED ORDER — PREDNISONE 10 MG PO TABS: ORAL_TABLET | ORAL | 0 refills | Status: DC

## 2022-10-06 NOTE — Patient Instructions (Signed)
Augmentin 875 mg take one pill twice daily  X 10 days - take at breakfast and supper with large glass of water.  It would help reduce the usual side effects (diarrhea and yeast infections) if you ate cultured yogurt at lunch.   Prednisone 10 mg take  4 each am x 2 days,   2 each am x 2 days,  1 each am x 2 days and stop   See calendar for specific medication instructions and bring it back for each and every office visit for every healthcare provider you see.  Without it,  you may not receive the best quality medical care that we feel you deserve.  You will note that the calendar groups together  your maintenance  medications that are timed at particular times of the day.  Think of this as your checklist for what your doctor has instructed you to do until your next evaluation to see what benefit  there is  to staying on a consistent group of medications intended to keep you well.  The other group at the bottom is entirely up to you to use as you see fit  for specific symptoms that may arise between visits that require you to treat them on an as needed basis.  Think of this as your action plan or "what if" list.   Separating the top medications from the bottom group is fundamental to providing you adequate care going forward.    Please schedule a follow up visit in 3 months but call sooner if needed unless still seeing Dr Verlee Monte

## 2022-10-06 NOTE — Progress Notes (Unsigned)
Subjective:   Patient ID: Andrea Mason, female    DOB: 1951-06-22   MRN: [id number]  Brief patient profile:  68  yobf never smoker followed by Dr Andrea Mason for allergic rhinitis and asthma     History of Present Illness  02/05/2013 1st pulmonary eval in [facility] era baseline = completely better on qvar 80 2bid and only use rescue once a week at baseline then much worse starting 02/03/13 with cough/ congestion/ green mucus and rescue x one by time of ov at 2pm.  No resting sob, very hoarse with harsh barking cough >Augmentin rx      12/18/2017  f/u ov/Andrea Mason re:  Asthma/ chronic rhinitis / doing better following med calendar  Chief Complaint  Patient presents with   Follow-up    slight cough, red colored mucus when blowing her nose, feels like z pac and prednisone helped after her last visit   very rare saba need  Not limited by breathing from desired activities   Sleeping fine  rec 3 month f/u Andrea Mason re med calendar/ reconciliation > did not do    06/13/2018 acute extended ov/Andrea Mason re: cough x one week > dark mucus with nasal congestion/ acute onset  Chief Complaint  Patient presents with   Acute Visit    coughing up black mucus,, sinus infection, nasal congestion   breathing is ok  Has calendar but not following contingency instructions re rx for cough with nasty sputum, has augmentin refillable thru 12/2018 rec If night time cough or reflux best medication = Pepcid 20 mg otc or get Dr Jenny Reichmann to write it.   I would take the vaccine when available.    10/15/2020  f/u ov/Andrea Mason re: asthma/ chronic rhinitis  using med calendar well/ maint on symb 51 2bid  Chief Complaint  Patient presents with   Follow-up    pt states having clear mucus in mouth   Dyspnea: planning going to gym / walking neighborhood x one hour  With hills x 1 h but not lately Cough: spoardic / daytime/ assoc pnds s purulent sputum Sleeping: bed is flat/ sev pillows / no resp complaints  SABA use: none  02: none   Rec Follow med cal   07/26/2021  f/u ov/Andrea Mason re: rhinitis/ asthma maint on dulera 200 when ran out of symbicort   Chief Complaint  Patient presents with   Follow-up    Patient  reports that she is doing ok.     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  It would help reduce the usual side effects (diarrhea and yeast infections) if you ate cultured yogurt at lunch.  See Dr Kathrin Penner Rx  July 03 2022   10/06/2022  ACUTE ov/Andrea Mason re: ? ABPA/ cough to point of choking    maint on nucala   Chief Complaint  Patient presents with   Follow-up    Pt states she is coughing, wheezing and choking more than usual at night for about 2 weks. Pt states she has tried Mucinex, hard candy, inhalers and ricola throat  lozenges.    Dyspnea:  custoldial work/ shopping  fine  Cough: x 2 weeks / light greens  Sleeping: flat bed with bunch of pillows  SABA use: twice daily hfa no neb 02: none      No obvious day to day or daytime variability or assoc excess/ purulent sputum or mucus plugs or hemoptysis or cp or chest tightness, subjective wheeze or overt sinus or hb symptoms.   *** without nocturnal  or early am exacerbation  of respiratory  c/o's or need for noct saba. Also denies any obvious fluctuation of symptoms with weather or environmental changes or other aggravating or alleviating factors except as outlined above   No unusual exposure hx or h/o childhood pna/ asthma or knowledge of premature birth.  Current Allergies, Complete Past Medical History, Past Surgical History, Family History, and Social History were reviewed in [facility] record.  ROS  The following are not active complaints unless bolded Hoarseness, sore throat, dysphagia, dental problems, itching, sneezing,  nasal congestion or discharge of excess mucus or purulent secretions, ear ache,   fever, chills, sweats, unintended wt loss or wt gain, classically pleuritic or exertional cp,  orthopnea pnd or arm/hand swelling  or leg swelling, presyncope, palpitations, abdominal pain, anorexia, nausea, vomiting, diarrhea  or change in bowel habits or change in bladder habits, change in stools or change in urine, dysuria, hematuria,  rash, arthralgias, visual complaints, headache, numbness, weakness or ataxia or problems with walking or coordination,  change in mood or  memory.        Current Meds  Medication Sig   acetaminophen (TYLENOL) 325 MG tablet Take as directed per bottle instructions   albuterol (VENTOLIN HFA) 108 (90 Base) MCG/ACT inhaler Inhale 1-2 puffs into the lungs every 6 (six) hours as needed for wheezing or shortness of breath.   amLODipine (NORVASC) 5 MG tablet Take 1 tablet (5 mg total) by mouth daily.   apixaban (ELIQUIS) 5 MG TABS tablet Take 1 tablet (5 mg total) by mouth 2 (two) times daily.   aspirin EC 81 MG tablet Take 1 tablet (81 mg total) by mouth daily. Swallow whole.   Biotin 10000 MCG TABS Take 10,000 mcg by mouth daily.   budesonide-formoterol (SYMBICORT) 80-4.5 MCG/ACT inhaler Inhale 2 puffs into the lungs 2 (two) times daily.   cetirizine (ZYRTEC) 10 MG tablet Take 10 mg by mouth at bedtime.    Cholecalciferol (VITAMIN D3) 2000 units TABS Take 2,000 Units by mouth daily.   Cyanocobalamin (VITAMIN B-12 PO) Take by mouth daily.   dextromethorphan-guaiFENesin (MUCINEX DM) 30-600 MG 12hr tablet Take 1-2 tablets by mouth 2 (two) times daily as needed (for congestion/cough (WITH FLUTTER VALVE)).    fluticasone (FLONASE) 50 MCG/ACT nasal spray Place 2 sprays into both nostrils daily.   Mepolizumab (NUCALA) 100 MG/ML SOAJ  Inject into the skin.   montelukast (SINGULAIR) 10 MG tablet Take 1 tablet (10 mg total) by mouth at bedtime.   oxymetazoline (AFRIN) 0.05 % nasal spray Place 2 sprays into both nostrils 2 (two) times daily as needed (nasal stuffiness and nasal pressure for 5 days).    pantoprazole (PROTONIX) 40 MG tablet Take 1 tablet (40 mg total) by mouth daily.   Respiratory Therapy Supplies (FLUTTER) DEVI Use as directed   sodium chloride (OCEAN) 0.65 % SOLN nasal spray Place 2 sprays into both nostrils as needed (dry nose).    Spacer/Aero-Holding Dorise Bullion by Does not apply route.   spironolactone (ALDACTONE) 25  MG tablet Take 1 tablet (25 mg total) by mouth daily.   SYMBICORT 80-4.5 MCG/ACT inhaler Inhale 2 puffs into the lungs 2 (two) times daily.   Current Facility-Administered Medications for the 10/06/22 encounter (Office Visit) with Tanda Rockers, MD  Medication   mepolizumab (NUCALA) injection 100 mg                             Objective:    Wts  10/06/2022          ***  07/26/2021         182   10/15/2020       191  06/13/2018         176  12/18/2017          170   07/11/16 159 lb 3.2 oz (72.2 kg)  07/11/16 160 lb (72.6 kg)  04/05/16 163 lb (73.9 kg)    Vital signs reviewed  10/06/2022  - Note at rest 02 sats  ***% on ***   General appearance:    ***                   Assessment & Plan:

## 2022-10-07 ENCOUNTER — Encounter: Payer: Self-pay | Admitting: Internal Medicine

## 2022-10-07 NOTE — Assessment & Plan Note (Signed)
Followed in pulmonary clinic by Rekia Kujala/ Annamaria Boots Verlee Monte     - Spirometry 12/11/2014  Min abn mid flows only   -  10/15/2020  After extensive coaching inhaler device,  effectiveness =    90%   Mild flare in setting of ongoing rhinitis/sinusitis with h/o nasal polyps so rx with nucala since July 03 2022 and expected response /win the next 3 months reviewed:  In meantime no change in maint/ prns/ action plan as per med calendar which includes augmentin x 10 day and pred x 6               Each maintenance medication was reviewed in detail    This was done in the context of a medication calendar review which provided the patient with a user-friendly unambiguous mechanism for medication administration and reconciliation and provides an action plan for all active problems. It is critical that this be shown to every doctor  for modification during the office visit if necessary so the patient can use it as a working document.   Total time for H and P, chart review, counseling, reviewing hfa device(s) and generating customized AVS unique to this office visit / same day charting = 22 min

## 2022-10-11 ENCOUNTER — Telehealth: Payer: Self-pay | Admitting: Hematology and Oncology

## 2022-10-11 NOTE — Telephone Encounter (Signed)
Scheduled appointment per 11/07 referral. Patient is aware of appointment date and time. Patient is aware to arrive 15 mins prior to appointment time and to bring updated insurance cards. Patient is aware of location.   

## 2022-10-31 ENCOUNTER — Ambulatory Visit (INDEPENDENT_AMBULATORY_CARE_PROVIDER_SITE_OTHER): Payer: PPO | Admitting: *Deleted

## 2022-10-31 DIAGNOSIS — J4550 Severe persistent asthma, uncomplicated: Secondary | ICD-10-CM

## 2022-11-06 ENCOUNTER — Ambulatory Visit
Admission: EM | Admit: 2022-11-06 | Discharge: 2022-11-06 | Disposition: A | Discharge status: Home / Self Care | Payer: PPO | Attending: Physician Assistant | Admitting: Physician Assistant

## 2022-11-06 ENCOUNTER — Inpatient Hospital Stay: Payer: PPO | Attending: Hematology and Oncology | Admitting: Hematology and Oncology

## 2022-11-06 DIAGNOSIS — Z1152 Encounter for screening for COVID-19: Secondary | ICD-10-CM | POA: Diagnosis not present

## 2022-11-06 DIAGNOSIS — J069 Acute upper respiratory infection, unspecified: Secondary | ICD-10-CM | POA: Diagnosis not present

## 2022-11-06 DIAGNOSIS — R058 Other specified cough: Secondary | ICD-10-CM | POA: Diagnosis not present

## 2022-11-06 LAB — RESP PANEL BY RT-PCR (FLU A&B, COVID) ARPGX2
Influenza A by PCR: NEGATIVE
Influenza B by PCR: NEGATIVE
SARS Coronavirus 2 by RT PCR: NEGATIVE

## 2022-11-06 NOTE — Discharge Instructions (Signed)
Return if any problems.

## 2022-11-06 NOTE — ED Triage Notes (Signed)
Pt presents with chills and generalized body aches today.

## 2022-11-08 ENCOUNTER — Ambulatory Visit: Payer: PPO | Admitting: Family Medicine

## 2022-11-09 ENCOUNTER — Telehealth: Payer: Self-pay | Admitting: Internal Medicine

## 2022-11-09 NOTE — ED Provider Notes (Addendum)
[name]    CSN: [id number] Arrival date & time: 11/06/22  1333      History   Chief Complaint Chief Complaint  Patient presents with   Chills   Generalized Body Aches    HPI Andrea Mason is a 71 y.o. female.   Pt complains of chills and body aches.  Pt denies shortness of breath.  Pt has a histroy of asthma and is on albuterol.    The history is provided by the patient. No language interpreter was used.    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   Stroke [facility])    Syncope     Patient Active Problem List   Diagnosis Date Noted   Leg DVT (deep venous thromboembolism), acute, left ([place]) 09/02/2022   History of stroke 09/02/2022   Lesion of right pinna 08/25/2022   Pain and swelling of left lower leg 08/23/2022   Ankle edema, bilateral 08/01/2022   Elevated serum creatinine 08/01/2022   Bilateral hip joint arthritis 07/16/2022   Right sided sciatica 07/14/2022   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   Recurrent infections 04/13/2022   Anemia 03/06/2022   Thrush 04/12/2021   Urinary frequency 09/22/2020   Wrist pain, acute, left 09/09/2020   CKD (chronic kidney disease) stage 3, GFR 30-59 ml/min (HCC) 12/31/2019   Painful legs and moving toes of right foot 12/16/2019   Skin nodule 11/16/2019   Nocturia 11/16/2019   Low back pain 11/16/2019   Acute recurrent pansinusitis 02/17/2019   Alopecia 01/31/2019   Upper airway cough syndrome 06/14/2018   Rash 05/30/2018   Vitamin D deficiency 08/31/2017   Facial contusion, subsequent encounter 03/12/2017   Vertigo 03/12/2017   Right rotator cuff tear 07/27/2016   Right shoulder pain  07/11/2016   Overactive bladder 01/19/2016   Cough variant asthma 09/07/2015   Burn 09/06/2015   Vocal cord dysfunction 04/05/2015   Asthma with exacerbation 04/05/2015   Left knee pain 03/12/2015   Right knee pain 03/12/2015   Cough 12/08/2014   Skin lumps, generalized 09/08/2014   Toe pain, right 08/06/2014   Pre-ulcerative corn or callous 08/06/2014   Hammertoe 08/06/2014   Swelling of joint, ankle, right 08/06/2014   Pansinusitis 06/02/2014   Cerebral thrombosis with cerebral infarction ([place]) 05/26/2014   Weakness 05/25/2014   Right arm pain 05/25/2014   Numbness in right leg 05/25/2014   Numbness and tingling of right arm 29/52/8413   Embolic stroke ([place]) 24/40/1027   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   Alterations of sensations, late effect of cerebrovascular disease(438.6) 02/10/2014   Cerebral infarction ([place]) 11/25/2013   Hyperlipidemia 11/24/2013   Syncope 11/20/2013   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   Back pain 11/06/2013   Hypokalemia 05/06/2013   Family history of colon cancer 05/06/2013   Lipoma 09/03/2012   Hyperglycemia 08/15/2012   Constipation 01/02/2012   Preventative health care 05/24/2011   Seasonal allergic rhinitis due to pollen 02/16/2011   OSTEOARTHRITIS, CERVICAL SPINE 02/09/2011   HYPERSOMNIA WITH SLEEP APNEA UNSPECIFIED 01/26/2011   UTI (urinary tract infection) 11/15/2010   NASAL POLYP 07/28/2010   FIBROIDS, UTERUS 07/27/2010  COMPUTERIZED TOMOGRAPHY, CHEST, ABNORMAL 09/16/2009   Seasonal and perennial allergic rhinitis 05/29/2009   PERIPHERAL EDEMA 05/28/2009   Mild persistent asthma, uncomplicated 61/95/0932   LEG PAIN, LEFT 06/30/2008   COLONIC POLYPS, HX OF 06/30/2008   OBESITY 12/12/2007   Depression 12/12/2007   Essential hypertension 12/12/2007   RHINOSINUSITIS, CHRONIC 12/12/2007   GERD 12/12/2007   ARTHRITIS 12/12/2007    Past Surgical History:  Procedure Laterality Date    COLONOSCOPY     FOOT SURGERY  1998   Implantable loop recorder placement  09/16/14   MDT LINQ implanted by Dr Rayann Heman for cryptogenic stroke, RIO II protocol   LOOP RECORDER REMOVAL N/A 10/02/2017   Procedure: LOOP RECORDER REMOVAL;  Surgeon: [name], MD;  Location: [place] [facility];  Service: Cardiovascular;  Laterality: N/A;   NASAL POLYP SURGERY  07/2006   x 2 with Dr. [name]   NASAL SINUS SURGERY  07/2006   Dr. [name]   TUBAL LIGATION      OB History   No obstetric history on file.      Home Medications    Prior to Admission medications   Medication Sig Start Date End Date Taking? Authorizing Provider  acetaminophen (TYLENOL) 325 MG tablet Take as directed per bottle instructions    [provider]  albuterol (VENTOLIN HFA) 108 (90 Base) MCG/ACT inhaler Inhale 1-2 puffs into the lungs every 6 (six) hours as needed for wheezing or shortness of breath. 05/23/22   Maryjane Hurter, MD  amLODipine (NORVASC) 5 MG tablet Take 1 tablet (5 mg total) by mouth daily. 03/28/22 03/28/23  Biagio Borg, MD  amoxicillin-clavulanate (AUGMENTIN) 875-125 MG tablet Take 1 tablet by mouth 2 (two) times daily. 10/06/22   Tanda Rockers, MD  apixaban (ELIQUIS) 5 MG TABS tablet Take 1 tablet (5 mg total) by mouth 2 (two) times daily. 09/01/22   Biagio Borg, MD  aspirin EC 81 MG tablet Take 1 tablet (81 mg total) by mouth daily. Swallow whole. 09/01/22   Biagio Borg, MD  Biotin 10000 MCG TABS Take 10,000 mcg by mouth daily.    [provider]  budesonide-formoterol (SYMBICORT) 80-4.5 MCG/ACT inhaler Inhale 2 puffs into the lungs 2 (two) times daily. 07/05/22   Tanda Rockers, MD  cetirizine (ZYRTEC) 10 MG tablet Take 10 mg by mouth at bedtime.     [provider]  Cholecalciferol (VITAMIN D3) 2000 units TABS Take 2,000 Units by mouth daily.    [provider]  Cyanocobalamin (VITAMIN B-12 PO) Take by mouth daily.    [provider]   dextromethorphan-guaiFENesin (MUCINEX DM) 30-600 MG 12hr tablet Take 1-2 tablets by mouth 2 (two) times daily as needed (for congestion/cough (WITH FLUTTER VALVE)).     [provider]  fluticasone (FLONASE) 50 MCG/ACT nasal spray Place 2 sprays into both nostrils daily. 06/10/21   Tanda Rockers, MD  Mepolizumab (NUCALA) 100 MG/ML SOAJ Inject into the skin.    [provider]  montelukast (SINGULAIR) 10 MG tablet Take 1 tablet (10 mg total) by mouth at bedtime. 06/13/22   Tanda Rockers, MD  oxymetazoline (AFRIN) 0.05 % nasal spray Place 2 sprays into both nostrils 2 (two) times daily as needed (nasal stuffiness and nasal pressure for 5 days).     [provider]  pantoprazole (PROTONIX) 40 MG tablet Take 1 tablet (40 mg total) by mouth daily. 07/17/22   Biagio Borg, MD  predniSONE (DELTASONE) 10 MG tablet  Take  4 each am x 2 days,   2 each am x 2 days,  1 each am x 2 days and stop 10/06/22   Tanda Rockers, MD  Respiratory Therapy Supplies (FLUTTER) DEVI Use as directed    [provider]  sodium chloride (OCEAN) 0.65 % SOLN nasal spray Place 2 sprays into both nostrils as needed (dry nose).     [provider]  Spacer/Aero-Holding Dorise Bullion by Does not apply route.    [provider]  spironolactone (ALDACTONE) 25 MG tablet Take 1 tablet (25 mg total) by mouth daily. 07/05/22   Biagio Borg, MD  SYMBICORT 80-4.5 MCG/ACT inhaler Inhale 2 puffs into the lungs 2 (two) times daily. 03/24/22   Maryjane Hurter, MD    Family History Family History  Problem Relation Age of Onset   Asthma Mother    Allergic rhinitis Mother    Cancer Mother    Allergic rhinitis Father    Asthma Sister    Allergic rhinitis Sister    Diabetes Sister    Allergic rhinitis Brother    Colon cancer Brother    Stroke Brother    Arrhythmia Brother    Allergic rhinitis Brother    Congenital heart disease Brother    Heart attack Brother    Lung disease Son         Sarcoid vs ARDS related fibrosis, required 2 double lung transplants   Esophageal cancer Neg Hx    Rectal cancer Neg Hx    Stomach cancer Neg Hx     Social History Social History   Tobacco Use   Smoking status: Never    Passive exposure: Current (on sister's clothing bc sister is a smoker)   Smokeless tobacco: Never  Vaping Use   Vaping Use: Never used  Substance Use Topics   Alcohol use: Yes    Comment: socially wine   Drug use: No     Allergies   Iodine, Ivp dye [iodinated contrast media], Promethazine-codeine, and Tramadol   Review of Systems Review of Systems  All other systems reviewed and are negative.    Physical Exam Triage Vital Signs ED Triage Vitals  Enc Vitals Group     BP 11/06/22 1431 136/84     Pulse Rate 11/06/22 1430 81     Resp 11/06/22 1430 18     Temp 11/06/22 1430 98.3 F (36.8 C)     Temp Source 11/06/22 1430 Oral     SpO2 11/06/22 1430 98 %     Weight --      Height --      Head Circumference --      Peak Flow --      Pain Score 11/06/22 1429 5     Pain Loc --      Pain Edu? --      Excl. in [place]? --    No data found.  Updated Vital Signs BP 136/84   Pulse 81   Temp 98.3 F (36.8 C) (Oral)   Resp 18   LMP  (LMP Unknown)   SpO2 98%   Visual Acuity Right Eye Distance:   Left Eye Distance:   Bilateral Distance:    Right Eye Near:   Left Eye Near:    Bilateral Near:     Physical Exam Vitals reviewed.  Constitutional:      Appearance: Normal appearance.  HENT:     Mouth/Throat:     Mouth: Mucous membranes are moist.  Eyes:     Pupils: Pupils are equal, round, and reactive to light.  Cardiovascular:     Rate and Rhythm: Normal rate.  Pulmonary:     Effort: Pulmonary effort is normal.  Abdominal:     General: Abdomen is flat.  Musculoskeletal:        General: Normal range of motion.  Skin:    General: Skin is warm.  Neurological:     General: No focal deficit present.     Mental Status: She is alert.   Psychiatric:        Mood and Affect: Mood normal.     UC Treatments / Results  Labs (all labs ordered are listed, but only abnormal results are displayed) Labs Reviewed  RESP PANEL BY RT-PCR (FLU A&B, COVID) [id number]    EKG   Radiology No results found.  Procedures Procedures (including critical care time)  Medications Ordered in UC Medications - No data to display  Initial Impression / Assessment and Plan / UC Course  I have reviewed the triage vital signs and the nursing notes.  Pertinent labs & imaging results that were available during my care of the patient were reviewed by me and considered in my medical decision making (see chart for details).     MDM:  Pt counseled on viral illness and treatement Final Clinical Impressions(s) / UC Diagnoses   Final diagnoses:  Upper airway cough syndrome  Viral URI     Discharge Instructions      Return if any problems.    ED Prescriptions   None    An After Visit Summary was printed and given to the patient.        PDMP not reviewed this encounter.   Fransico Meadow, PA-C 11/09/22 1854    Fransico Meadow, [place] 11/09/22 1912

## 2022-11-09 NOTE — Telephone Encounter (Signed)
Pt has appt with PCP 12/05/2022 and lab appt 11/29/2022.  Need lab orders please.

## 2022-11-09 NOTE — Telephone Encounter (Signed)
Current lab orders already in place and active.  Ok to use this.  thanks

## 2022-11-21 ENCOUNTER — Telehealth: Payer: Self-pay | Admitting: Student

## 2022-11-22 NOTE — Telephone Encounter (Unsigned)
Called and spoke with patient, advised to complete antibiotics and continue Mucinex as previously instructed.  Advised to call back if not feeling better or feeling worse after completing antibiotics.  She verbalized understanding.  Scheduled follow up with Dr. Verlee Monte on 01/15/2023.  Nothing further needed.

## 2022-11-23 ENCOUNTER — Ambulatory Visit: Payer: PPO | Admitting: Emergency Medicine

## 2022-11-29 ENCOUNTER — Other Ambulatory Visit (INDEPENDENT_AMBULATORY_CARE_PROVIDER_SITE_OTHER): Payer: PPO

## 2022-11-29 DIAGNOSIS — E538 Deficiency of other specified B group vitamins: Secondary | ICD-10-CM | POA: Diagnosis not present

## 2022-11-29 DIAGNOSIS — R739 Hyperglycemia, unspecified: Secondary | ICD-10-CM

## 2022-11-29 DIAGNOSIS — N183 Chronic kidney disease, stage 3 unspecified: Secondary | ICD-10-CM

## 2022-11-29 DIAGNOSIS — Z0000 Encounter for general adult medical examination without abnormal findings: Secondary | ICD-10-CM

## 2022-11-29 LAB — LIPID PANEL
Cholesterol: 113 mg/dL (ref 0–200)
HDL: 54.1 mg/dL (ref >39.00)
LDL Cholesterol: 30 mg/dL (ref 0–99)
NonHDL: 58.69
Total CHOL/HDL Ratio: 2
Triglycerides: 141 mg/dL (ref 0.0–149.0)
VLDL: 28.2 mg/dL (ref 0.0–40.0)

## 2022-11-29 LAB — TSH: TSH: 1.41 u[IU]/mL (ref 0.35–5.50)

## 2022-11-29 LAB — BASIC METABOLIC PANEL
BUN: 11 mg/dL (ref 6–23)
CO2: 30 mEq/L (ref 19–32)
Calcium: 9.7 mg/dL (ref 8.4–10.5)
Chloride: 103 mEq/L (ref 96–112)
Creatinine, Ser: 0.95 mg/dL (ref 0.40–1.20)
GFR: 60.97 mL/min (ref >60.00)
Glucose, Bld: 99 mg/dL (ref 70–99)
Potassium: 4.2 mEq/L (ref 3.5–5.1)
Sodium: 139 mEq/L (ref 135–145)

## 2022-11-29 LAB — VITAMIN D 25 HYDROXY (VIT D DEFICIENCY, FRACTURES): VITD: 41.35 ng/mL (ref 30.00–100.00)

## 2022-11-29 LAB — HEMOGLOBIN A1C: Hgb A1c MFr Bld: 5.8 % (ref 4.6–6.5)

## 2022-11-29 LAB — VITAMIN B12: Vitamin B-12: >1500 pg/mL — ABNORMAL HIGH (ref 211–911)

## 2022-11-29 LAB — PHOSPHORUS: Phosphorus: 2.5 mg/dL (ref 2.3–4.6)

## 2022-11-30 ENCOUNTER — Ambulatory Visit (INDEPENDENT_AMBULATORY_CARE_PROVIDER_SITE_OTHER): Payer: PPO

## 2022-11-30 ENCOUNTER — Ambulatory Visit: Payer: PPO | Admitting: Student

## 2022-11-30 DIAGNOSIS — J4550 Severe persistent asthma, uncomplicated: Secondary | ICD-10-CM | POA: Diagnosis not present

## 2022-12-01 LAB — PTH, INTACT AND CALCIUM
Calcium: 9.3 mg/dL (ref 8.6–10.4)
PTH: 38 pg/mL (ref 16–77)

## 2022-12-05 ENCOUNTER — Encounter: Payer: Self-pay | Admitting: Internal Medicine

## 2022-12-05 ENCOUNTER — Ambulatory Visit (INDEPENDENT_AMBULATORY_CARE_PROVIDER_SITE_OTHER): Payer: PPO | Admitting: Internal Medicine

## 2022-12-05 VITALS — BP 124/70 | HR 67 | Temp 97.6°F | Ht 61.0 in | Wt 174.0 lb

## 2022-12-05 DIAGNOSIS — J0141 Acute recurrent pansinusitis: Secondary | ICD-10-CM

## 2022-12-05 DIAGNOSIS — R739 Hyperglycemia, unspecified: Secondary | ICD-10-CM

## 2022-12-05 DIAGNOSIS — E7849 Other hyperlipidemia: Secondary | ICD-10-CM

## 2022-12-05 DIAGNOSIS — E2839 Other primary ovarian failure: Secondary | ICD-10-CM | POA: Diagnosis not present

## 2022-12-05 DIAGNOSIS — Z0001 Encounter for general adult medical examination with abnormal findings: Secondary | ICD-10-CM | POA: Diagnosis not present

## 2022-12-05 DIAGNOSIS — I82402 Acute embolism and thrombosis of unspecified deep veins of left lower extremity: Secondary | ICD-10-CM

## 2022-12-05 DIAGNOSIS — E559 Vitamin D deficiency, unspecified: Secondary | ICD-10-CM

## 2022-12-05 MED ORDER — DOXYCYCLINE HYCLATE 100 MG PO TABS: 100.0000 mg | ORAL_TABLET | Freq: Two times a day (BID) | ORAL | 0 refills | Status: DC

## 2022-12-05 NOTE — Progress Notes (Signed)
Patient ID: Andrea Mason, female   DOB: 10-15-1951, 72 y.o.   MRN: [id number]         Chief Complaint:: wellness exam and sinusitis, hyperglycemia, hld, low vit d, hx of Dvt LLE       HPI:  Andrea Mason is a 72 y.o. female here for wellness exam; due for DXA, mammogram, to have shingrix at pharmacy, o/w up to date                        Also  Here with 2-3 days acute onset fever, facial pain, pressure, headache, general weakness and malaise, and greenish d/c, with mild ST and cough, but pt denies chest pain, wheezing, increased sob or doe, orthopnea, PND, increased LE swelling, palpitations, dizziness or syncope.   Pt denies polydipsia, polyuria, or new focal neuro s/s.    Pt denies fever, wt loss, night sweats, loss of appetite, or other constitutional symptoms     Wt Readings from Last 3 Encounters:  12/05/22 174 lb (78.9 kg)  10/06/22 177 lb 6.4 oz (80.5 kg)  09/29/22 178 lb 9.6 oz (81 kg)   BP Readings from Last 3 Encounters:  12/05/22 124/70  11/06/22 136/84  10/06/22 124/68   Immunization History  Administered Date(s) Administered   Fluad Quad(high Dose 65+) 08/23/2022   Influenza Split 09/03/2012   Influenza Whole 09/27/2006, 08/23/2007, 11/16/2009, 08/25/2010   Influenza,inj,Quad PF,6+ Mos 08/21/2013, 08/06/2014, 10/05/2015   PFIZER(Purple Top)SARS-COV-2 Vaccination 03/04/2020, 03/29/2020   Pneumococcal Conjugate-13 12/02/2014   Pneumococcal Polysaccharide-23 06/03/2019   Tdap 05/06/2013, 08/25/2015   Zoster, Live 05/06/2013   Health Maintenance Due  Topic Date Due   MAMMOGRAM  04/03/2018      Past Medical History:  Diagnosis Date   Allergic rhinitis    Allergy    Asthma    Depression    DJD (degenerative joint disease), cervical    DVT (deep venous thrombosis) (HCC)    GERD (gastroesophageal reflux disease)    History of colonic polyps    Hypertension    Obesity    Primary hyperaldosteronism ([place]) 11/06/2013   Stroke ([place])    Syncope    Past Surgical  History:  Procedure Laterality Date   COLONOSCOPY     FOOT SURGERY  1998   Implantable loop recorder placement  09/16/14   MDT LINQ implanted by Dr Rayann Heman for cryptogenic stroke, RIO II protocol   LOOP RECORDER REMOVAL N/A 10/02/2017   Procedure: LOOP RECORDER REMOVAL;  Surgeon: [name], MD;  Location: [facility];  Service: Cardiovascular;  Laterality: N/A;   NASAL POLYP SURGERY  07/2006   x 2 with Dr. [name]   NASAL SINUS SURGERY  07/2006   Dr. [name]   TUBAL LIGATION      reports that she has never smoked. She has been exposed to tobacco smoke. She has never used smokeless tobacco. She reports current alcohol use. She reports that she does not use drugs. family history includes Allergic rhinitis in her brother, brother, father, mother, and sister; Arrhythmia in her brother; Asthma in her mother and sister; Cancer in her mother; Colon cancer in her brother; Congenital heart disease in her brother; Diabetes in her sister; Heart attack in her brother; Lung disease in her son; Stroke in her brother. Allergies  Allergen Reactions   Iodine Nausea And Vomiting and Rash    Reaction: hot flashes   Ivp Dye [Iodinated Contrast Media] Nausea And Vomiting and Other (See Comments)  hot flashes   Promethazine-Codeine Nausea And Vomiting   Tramadol Other (See Comments)    Almost passed out   Current Outpatient Medications on File Prior to Visit  Medication Sig Dispense Refill   albuterol (VENTOLIN HFA) 108 (90 Base) MCG/ACT inhaler Inhale 1-2 puffs into the lungs every 6 (six) hours as needed for wheezing or shortness of breath. 1 each 11   amoxicillin-clavulanate (AUGMENTIN) 875-125 MG tablet Take 1 tablet by mouth 2 (two) times daily. 20 tablet 11   apixaban (ELIQUIS) 5 MG TABS tablet Take 1 tablet (5 mg total) by mouth 2 (two) times daily. 60 tablet 5   aspirin EC 81 MG tablet Take 1 tablet (81 mg total) by mouth daily. Swallow whole. 30 tablet 12   Biotin 10000 MCG TABS Take  10,000 mcg by mouth daily.     budesonide-formoterol (SYMBICORT) 80-4.5 MCG/ACT inhaler Inhale 2 puffs into the lungs 2 (two) times daily. 10.2 g 11   cetirizine (ZYRTEC) 10 MG tablet Take 10 mg by mouth at bedtime.      Cholecalciferol (VITAMIN D3) 2000 units TABS Take 2,000 Units by mouth daily.     Cyanocobalamin (VITAMIN B-12 PO) Take by mouth daily.     dextromethorphan-guaiFENesin (MUCINEX DM) 30-600 MG 12hr tablet Take 1-2 tablets by mouth 2 (two) times daily as needed (for congestion/cough (WITH FLUTTER VALVE)).      fluticasone (FLONASE) 50 MCG/ACT nasal spray Place 2 sprays into both nostrils daily. 16 g 5   LAGEVRIO 200 MG CAPS capsule Take by mouth.     Mepolizumab (NUCALA) 100 MG/ML SOAJ Inject into the skin.     montelukast (SINGULAIR) 10 MG tablet Take 1 tablet (10 mg total) by mouth at bedtime. 30 tablet 11   oxymetazoline (AFRIN) 0.05 % nasal spray Place 2 sprays into both nostrils 2 (two) times daily as needed (nasal stuffiness and nasal pressure for 5 days).      pantoprazole (PROTONIX) 40 MG tablet Take 1 tablet (40 mg total) by mouth daily. 90 tablet 1   predniSONE (DELTASONE) 5 MG tablet      Respiratory Therapy Supplies (FLUTTER) DEVI Use as directed     sodium chloride (OCEAN) 0.65 % SOLN nasal spray Place 2 sprays into both nostrils as needed (dry nose).      Spacer/Aero-Holding Dorise Bullion by Does not apply route.     spironolactone (ALDACTONE) 25 MG tablet Take 1 tablet (25 mg total) by mouth daily. 90 tablet 2   Current Facility-Administered Medications on File Prior to Visit  Medication Dose Route Frequency Provider Last Rate Last Admin   mepolizumab (NUCALA) injection 100 mg  100 mg Subcutaneous Q28 days Valentina Shaggy, MD   100 mg at 11/30/22 1458        ROS:  All others reviewed and negative.  Objective        PE:  BP 124/70 (BP Location: Left Arm, Patient Position: Sitting, Cuff Size: Large)   Pulse 67   Temp 97.6 F (36.4 C) (Oral)   Ht '5\' 1"'$   (1.549 m)   Wt 174 lb (78.9 kg)   LMP  (LMP Unknown)   SpO2 95%   BMI 32.88 kg/m                 Constitutional: Pt appears in NAD               HENT: Head: NCAT.  Right Ear: External ear normal.                 Left Ear: External ear normal. Bilat tm's with mild erythema.  Max sinus areas mild tender.  Pharynx with mild erythema, no exudate               Eyes: . Pupils are equal, round, and reactive to light. Conjunctivae and EOM are normal               Nose: without d/c or deformity               Neck: Neck supple. Gross normal ROM               Cardiovascular: Normal rate and regular rhythm.                 Pulmonary/Chest: Effort normal and breath sounds without rales or wheezing.                Abd:  Soft, NT, ND, + BS, no organomegaly               Neurological: Pt is alert. At baseline orientation, motor grossly intact               Skin: Skin is warm. No rashes, no other new lesions, LE edema - none               Psychiatric: Pt behavior is normal without agitation   Micro: none  Cardiac tracings I have personally interpreted today:  none  Pertinent Radiological findings (summarize): none   Lab Results  Component Value Date   WBC 4.9 09/21/2022   HGB 10.8 (L) 09/21/2022   HCT 34.1 (L) 09/21/2022   PLT 231 09/21/2022   GLUCOSE 99 11/29/2022   CHOL 113 11/29/2022   TRIG 141.0 11/29/2022   HDL 54.10 11/29/2022   LDLCALC 30 11/29/2022   ALT 9 09/21/2022   AST 17 09/21/2022   NA 139 11/29/2022   K 4.2 11/29/2022   CL 103 11/29/2022   CREATININE 0.95 11/29/2022   BUN 11 11/29/2022   CO2 30 11/29/2022   TSH 1.41 11/29/2022   INR 1.0 12/26/2019   HGBA1C 5.8 11/29/2022   Assessment/Plan:  Andrea Mason is a 72 y.o. Black or African American [2] female with  has a past medical history of Allergic rhinitis, Allergy, Asthma, Depression, DJD (degenerative joint disease), cervical, DVT (deep venous thrombosis) ([place]), GERD (gastroesophageal reflux disease),  History of colonic polyps, Hypertension, Obesity, Primary hyperaldosteronism ([place]) (11/06/2013), Stroke ([place]), and Syncope.  Encounter for well adult exam with abnormal findings Age and sex appropriate education and counseling updated with regular exercise and diet Referrals for preventative services - for DXA, mammgoram Immunizations addressed - for shingrix at pharmacy Smoking counseling  - none needed Evidence for depression or other mood disorder - none significant Most recent labs reviewed. I have personally reviewed and have noted: 1) the patient's medical and social history 2) The patient's current medications and supplements 3) The patient's height, weight, and BMI have been recorded in the chart   Hyperglycemia Lab Results  Component Value Date   HGBA1C 5.8 11/29/2022   Stable, pt to continue current medical treatment - diet, wt control, excercise   Hyperlipidemia Lab Results  Component Value Date   LDLCALC 30 11/29/2022   Stable, pt to continue current statin  - low chol diet, declines statin   Vitamin D deficiency Last vitamin D Lab  Results  Component Value Date   VD25OH 41.35 11/29/2022   Stable, cont oral replacement   Acute recurrent pansinusitis Mild to mod, for antibx course doxycycline 100 bid,  to f/u any worsening symptoms or concerns  Leg DVT (deep venous thromboembolism), acute, left (HCC) Also with hx of embolic cva - continue long term eiquis 5 bid  Followup: Return in about 1 year (around 12/06/2023).  Cathlean Cower, MD 12/05/2022 7:55 PM [facility]

## 2022-12-05 NOTE — Assessment & Plan Note (Signed)
Lab Results  Component Value Date   LDLCALC 30 11/29/2022   Stable, pt to continue current statin  - low chol diet, declines statin

## 2022-12-05 NOTE — Assessment & Plan Note (Signed)
Lab Results  Component Value Date   HGBA1C 5.8 11/29/2022   Stable, pt to continue current medical treatment - diet, wt control, excercise

## 2022-12-05 NOTE — Assessment & Plan Note (Signed)
Mild to mod, for antibx course doxycycline 100 bid,  to f/u any worsening symptoms or concerns 

## 2022-12-05 NOTE — Assessment & Plan Note (Signed)
Also with hx of embolic cva - continue long term eiquis 5 bid

## 2022-12-05 NOTE — Progress Notes (Signed)
Patient ID: Andrea Mason, female   DOB: [birth year], 72 y.o.   MRN: [id number]

## 2022-12-05 NOTE — Assessment & Plan Note (Signed)
Age and sex appropriate education and counseling updated with regular exercise and diet Referrals for preventative services - for DXA, mammgoram Immunizations addressed - for shingrix at pharmacy Smoking counseling  - none needed Evidence for depression or other mood disorder - none significant Most recent labs reviewed. I have personally reviewed and have noted: 1) the patient's medical and social history 2) The patient's current medications and supplements 3) The patient's height, weight, and BMI have been recorded in the chart

## 2022-12-05 NOTE — Patient Instructions (Addendum)
Please have your Shingrix (shingles) shots done at your local pharmacy  Please take all new medication as prescribed  - the antibiotic,  Please schedule the bone density test before leaving today at the scheduling desk (where you check out)  Please continue all other medications as before, and refills have been done if requested.  Please have the pharmacy call with any other refills you may need.  Please continue your efforts at being more active, low cholesterol diet, and weight control.  You are otherwise up to date with prevention measures today.  Please keep your appointments with your specialists as you may have planned  Please make an Appointment to return for your 1 year visit, or sooner if needed

## 2022-12-05 NOTE — Assessment & Plan Note (Signed)
Last vitamin D Lab Results  Component Value Date   VD25OH 41.35 11/29/2022   Stable, cont oral replacement

## 2022-12-11 ENCOUNTER — Ambulatory Visit
Admission: RE | Admit: 2022-12-11 | Discharge: 2022-12-11 | Disposition: A | Discharge status: Home / Self Care | Payer: Medicare HMO | Source: Ambulatory Visit | Attending: Internal Medicine | Admitting: Internal Medicine

## 2022-12-11 DIAGNOSIS — Z1231 Encounter for screening mammogram for malignant neoplasm of breast: Secondary | ICD-10-CM | POA: Diagnosis not present

## 2022-12-12 ENCOUNTER — Ambulatory Visit (HOSPITAL_COMMUNITY): Payer: Medicare HMO

## 2022-12-12 ENCOUNTER — Ambulatory Visit: Payer: Medicare HMO

## 2022-12-18 ENCOUNTER — Inpatient Hospital Stay: Admission: RE | Admit: 2022-12-18 | Payer: PPO | Source: Ambulatory Visit

## 2022-12-26 ENCOUNTER — Ambulatory Visit (INDEPENDENT_AMBULATORY_CARE_PROVIDER_SITE_OTHER)
Admission: RE | Admit: 2022-12-26 | Discharge: 2022-12-26 | Disposition: A | Discharge status: Home / Self Care | Payer: Medicare HMO | Source: Ambulatory Visit | Attending: Internal Medicine | Admitting: Internal Medicine

## 2022-12-26 ENCOUNTER — Other Ambulatory Visit: Payer: Medicare HMO

## 2022-12-26 ENCOUNTER — Other Ambulatory Visit: Payer: Self-pay | Admitting: Internal Medicine

## 2022-12-26 DIAGNOSIS — E2839 Other primary ovarian failure: Secondary | ICD-10-CM | POA: Diagnosis not present

## 2022-12-26 MED ORDER — ALENDRONATE SODIUM 70 MG PO TABS: 70.0000 mg | ORAL_TABLET | ORAL | 3 refills | Status: DC

## 2022-12-26 NOTE — Telephone Encounter (Signed)
Called and attempted to leave a message for patient to inform her that the forms that she requested back in August of 2023 now have a cost of $25.00 due to lack of pick up and new rules of [facility] now that Covid has calmed down fees are back in place. Patient must pay the full twenty five dollars before form can be given to her. I also called grandson and asked tat she contact our office.

## 2022-12-27 ENCOUNTER — Telehealth: Payer: Self-pay | Admitting: Internal Medicine

## 2022-12-27 NOTE — Telephone Encounter (Signed)
Informed patient of result note and recommended medication.

## 2022-12-27 NOTE — Telephone Encounter (Signed)
Patient called back wanting to know her results from her bone density test she had yesterday. Patient is requesting a call back. Best call back number is [phone].

## 2023-01-01 ENCOUNTER — Ambulatory Visit: Payer: Self-pay

## 2023-01-01 ENCOUNTER — Ambulatory Visit: Payer: Medicare HMO

## 2023-01-02 ENCOUNTER — Encounter: Payer: Self-pay | Admitting: Physician Assistant

## 2023-01-02 ENCOUNTER — Ambulatory Visit (INDEPENDENT_AMBULATORY_CARE_PROVIDER_SITE_OTHER): Payer: Medicare HMO | Admitting: Physician Assistant

## 2023-01-02 ENCOUNTER — Ambulatory Visit (HOSPITAL_COMMUNITY)
Admission: RE | Admit: 2023-01-02 | Discharge: 2023-01-02 | Disposition: A | Discharge status: Home / Self Care | Payer: Medicare HMO | Source: Ambulatory Visit | Attending: Vascular Surgery | Admitting: Vascular Surgery

## 2023-01-02 DIAGNOSIS — I82412 Acute embolism and thrombosis of left femoral vein: Secondary | ICD-10-CM | POA: Insufficient documentation

## 2023-01-02 NOTE — Progress Notes (Signed)
Office Note     CC:  follow up Requesting Provider:  Biagio Borg, MD  HPI: Andrea Mason is a 72 y.o. (07-24-1951) female who presents for follow up of left lower extremity DVT.  She was diagnosed with a Left lower extremity DVT in October of 2023. She was initiated on Eliquis.No etiology for her DVT was identified. She has been compliant with her Eliquis. She also wears her knee high compression stockings daily. She denies any pain or swelling in her leg. She was referred to Heme/Onc however she said she was very sick on the day of her appointment and was unable to go. She is in the process of trying to get this rescheduled.  The pt is not on a statin for cholesterol management.  The pt is on a daily aspirin.   Other AC:  Eliquis The pt is not on medication for hypertension.   The pt [place] not diabetic.   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 [name]   NASAL SINUS SURGERY  07/2006   Dr. [name]   TUBAL LIGATION      Social History   Socioeconomic History   Marital status: Widowed    Spouse name: Not on file   Number of children: 3   Years of education: Not on file   Highest education level: Not on file   Occupational History   Occupation: Publishing copy    Comment: [employer]  Tobacco Use   Smoking status: Never    Passive exposure: Current (on sister's clothing bc sister is a smoker)   Smokeless tobacco: Never  Vaping Use   Vaping Use: Never used  Substance and Sexual Activity   Alcohol use: Yes    Comment: socially wine   Drug use: No   Sexual activity: Not Currently    Birth control/protection: Post-menopausal  Other Topics Concern   Not on file  Social History Narrative   Widowed, husband died in 03-12-2006   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   Patient lives alone.   Social Determinants of Health   Financial Resource Strain: Not on file  Food Insecurity: Not on file  Transportation Needs: Not on file  Physical Activity: Not on file  Stress: Not on file  Social Connections: Not on file  Intimate Partner Violence: Not on file    Family History  Problem Relation Age of Onset   Asthma Mother    Allergic rhinitis Mother    Cancer Mother  Allergic rhinitis Father    Asthma Sister    Allergic rhinitis Sister    Diabetes Sister    Allergic rhinitis Brother    Colon cancer Brother    Stroke Brother    Arrhythmia Brother    Allergic rhinitis Brother    Congenital heart disease Brother    Heart attack Brother    Lung disease Son        Sarcoid vs ARDS related fibrosis, required 2 double lung transplants   Esophageal cancer Neg Hx    Rectal cancer Neg Hx    Stomach cancer Neg Hx     Current Outpatient Medications  Medication Sig Dispense Refill   alendronate (FOSAMAX) 70 MG tablet Take 1 tablet (70 mg total) by mouth every 7 (seven) days. Take with a full glass of water on an empty stomach. 12 tablet 3   albuterol (VENTOLIN HFA) 108 (90 Base) MCG/ACT inhaler Inhale 1-2 puffs into the lungs every 6 (six) hours as needed for wheezing or shortness of breath. 1 each 11   amoxicillin-clavulanate  (AUGMENTIN) 875-125 MG tablet Take 1 tablet by mouth 2 (two) times daily. 20 tablet 11   apixaban (ELIQUIS) 5 MG TABS tablet Take 1 tablet (5 mg total) by mouth 2 (two) times daily. 60 tablet 5   aspirin EC 81 MG tablet Take 1 tablet (81 mg total) by mouth daily. Swallow whole. 30 tablet 12   Biotin 10000 MCG TABS Take 10,000 mcg by mouth daily.     budesonide-formoterol (SYMBICORT) 80-4.5 MCG/ACT inhaler Inhale 2 puffs into the lungs 2 (two) times daily. 10.2 g 11   cetirizine (ZYRTEC) 10 MG tablet Take 10 mg by mouth at bedtime.      Cholecalciferol (VITAMIN D3) 2000 units TABS Take 2,000 Units by mouth daily.     Cyanocobalamin (VITAMIN B-12 PO) Take by mouth daily.     dextromethorphan-guaiFENesin (MUCINEX DM) 30-600 MG 12hr tablet Take 1-2 tablets by mouth 2 (two) times daily as needed (for congestion/cough (WITH FLUTTER VALVE)).      doxycycline (VIBRA-TABS) 100 MG tablet Take 1 tablet (100 mg total) by mouth 2 (two) times daily. 20 tablet 0   fluticasone (FLONASE) 50 MCG/ACT nasal spray Place 2 sprays into both nostrils daily. 16 g 5   LAGEVRIO 200 MG CAPS capsule Take by mouth.     Mepolizumab (NUCALA) 100 MG/ML SOAJ Inject into the skin.     montelukast (SINGULAIR) 10 MG tablet Take 1 tablet (10 mg total) by mouth at bedtime. 30 tablet 11   oxymetazoline (AFRIN) 0.05 % nasal spray Place 2 sprays into both nostrils 2 (two) times daily as needed (nasal stuffiness and nasal pressure for 5 days).      pantoprazole (PROTONIX) 40 MG tablet Take 1 tablet (40 mg total) by mouth daily. 90 tablet 1   predniSONE (DELTASONE) 5 MG tablet      Respiratory Therapy Supplies (FLUTTER) DEVI Use as directed     sodium chloride (OCEAN) 0.65 % SOLN nasal spray Place 2 sprays into both nostrils as needed (dry nose).      Spacer/Aero-Holding Dorise Bullion by Does not apply route.     spironolactone (ALDACTONE) 25 MG tablet Take 1 tablet (25 mg total) by mouth daily. 90 tablet 2   Current  Facility-Administered Medications  Medication Dose Route Frequency Provider Last Rate Last Admin   mepolizumab (NUCALA) injection 100 mg  100 mg Subcutaneous Q28 days Valentina Shaggy, MD   100 mg at 11/30/22  1458    Allergies  Allergen Reactions   Iodine Nausea And Vomiting and Rash    Reaction: hot flashes   Ivp Dye [Iodinated Contrast Media] Nausea And Vomiting and Other (See Comments)    hot flashes   Promethazine-Codeine Nausea And Vomiting   Tramadol Other (See Comments)    Almost passed out     REVIEW OF SYSTEMS:  '[X]'$  denotes positive finding, '[ ]'$  denotes negative finding Cardiac  Comments:  Chest pain or chest pressure:    Shortness of breath upon exertion:    Short of breath when lying flat:    Irregular heart rhythm:        Vascular    Pain in calf, thigh, or hip brought on by ambulation:    Pain in feet at night that wakes you up from your sleep:     Blood clot in your veins:    Leg swelling:         Pulmonary    Oxygen at home:    Productive cough:     Wheezing:         Neurologic    Sudden weakness in arms or legs:     Sudden numbness in arms or legs:     Sudden onset of difficulty speaking or slurred speech:    Temporary loss of vision in one eye:     Problems with dizziness:         Gastrointestinal    Blood in stool:     Vomited blood:         Genitourinary    Burning when urinating:     Blood in urine:        Psychiatric    Major depression:         Hematologic    Bleeding problems:    Problems with blood clotting too easily:        Skin    Rashes or ulcers:        Constitutional    Fever or chills:      PHYSICAL EXAMINATION:  There were no vitals filed for this visit.  General:  WDWN in NAD; vital signs documented above Gait: Normal HENT: WNL, normocephalic Pulmonary: normal non-labored breathing , without wheezing Cardiac: regular HR Abdomen: soft Vascular Exam/Pulses:2+ femoral, popliteal, DP and PT pulses  bilaterally. Feet warm and well perfused. She has no edema bilaterally.  without ischemic changes, without Gangrene , without cellulitis; without open wounds;  Musculoskeletal: no muscle wasting or atrophy  Neurologic: A&O X 3;  No focal weakness or paresthesias are detected Psychiatric:  The pt has Normal affect.   Non-Invasive Vascular Imaging:    +-----+---------------+---------+-----------+----------+--------------+  RIGHTCompressibilityPhasicitySpontaneityPropertiesThrombus Aging  +-----+---------------+---------+-----------+----------+--------------+  CFV Full           Yes      Yes                                  +-----+---------------+---------+-----------+----------+--------------+   +---------+---------------+---------+-----------+----------+--------------+   LEFT    CompressibilityPhasicitySpontaneityPropertiesThrombus  Aging  +---------+---------------+---------+-----------+----------+--------------+   CFV     Full           Yes      Yes                                   +---------+---------------+---------+-----------+----------+--------------+   SFJ     Full                                                          +---------+---------------+---------+-----------+----------+--------------+  FV Prox  Partial                 Yes                  Chronic          +---------+---------------+---------+-----------+----------+--------------+   FV Mid   Partial                 Yes                  Chronic          +---------+---------------+---------+-----------+----------+--------------+   FV DistalPartial                 Yes                  Chronic          +---------+---------------+---------+-----------+----------+--------------+   PFV     Full                                                          +---------+---------------+---------+-----------+----------+--------------+   POP     Partial         Yes      Yes                  Chronic          +---------+---------------+---------+-----------+----------+--------------+   PTV     Full                                                          +---------+---------------+---------+-----------+----------+--------------+   PERO    Full                                                          +---------+---------------+---------+-----------+----------+--------------+   Summary:  RIGHT:  - No evidence of common femoral vein obstruction.    LEFT:  - Findings consistent with chronic deep vein thrombosis involving the left femoral vein, and left popliteal vein. When compared to prior studies, thrombus in the common femoral, saphenofemoral junction, posterior tibial veins, and peroneal veins appears to have resolved.  - No cystic structure found in the popliteal fossa.    ASSESSMENT/PLAN:: 72 y.o. female here for follow up of LLE DVT. Her symptoms are now resolved. Her duplex shows chronic thrombus in her left femoral vein and popliteal vein. She has had resolution of her CFV, SFJ, Posterior tibial and peroneal veins. I have recommended continuation of her Eliquis until she is able to follow up with Heme/ Onc. With unknown etiology of her DVT feel this is safer until she has had a full hypercoagulable workup. - Continue Eliquis daily - Continue knee high compression stockings - encouraged her to elevate her legs above level of her heart daily - She can follow up with [place] as needed if she has any new or concerning symptoms   Karoline Caldwell, PA-C [facility] [phone]  Clinic MD:  [facility]

## 2023-01-05 ENCOUNTER — Ambulatory Visit: Payer: PPO

## 2023-01-08 ENCOUNTER — Ambulatory Visit (INDEPENDENT_AMBULATORY_CARE_PROVIDER_SITE_OTHER): Payer: Medicare HMO

## 2023-01-08 DIAGNOSIS — J4550 Severe persistent asthma, uncomplicated: Secondary | ICD-10-CM | POA: Diagnosis not present

## 2023-01-09 ENCOUNTER — Ambulatory Visit: Payer: PPO

## 2023-01-09 ENCOUNTER — Telehealth: Payer: Self-pay | Admitting: Physician Assistant

## 2023-01-09 NOTE — Telephone Encounter (Signed)
Spoke to pt in person, r/s her new hem appt from December. Gave pt calendar print out with appt information.

## 2023-01-13 NOTE — Progress Notes (Unsigned)
Synopsis: Referred for wheeze, thrush by Biagio Borg, MD  Subjective:   PATIENT ID: Andrea Mason GENDER: female DOB: Apr 15, 1951, MRN: [id number]  No chief complaint on file.  72yF with history of GERD, cough-variant asthma following with Dr. Melvyn Novas on symbicort 80/singulair/flonase/flutter, allergy/CRS, primary hyperaldo, stroke  Several courses of prednisone this past year.   She says she's been taking clotrimazole troches for the last couple weeks for thrush.  She is taking symbicort 2 puffs twice daily, rinsing mouth/gargling after use. She insists she has very strong steroid response.   Interval HPI: Covid-19 infection with LLE DVT since last visit, started on eliquis  She hasn't had any bleeding since starting eliquis.   Breathing is ok. Does worry a bit about weather change. Has not needed any prednisone since last visit. No ABX since last visit. No change in albuterol use.  ---------------------------------------- Given course of augmentin, prednisone at ov with [facility] 10/06/22  TTE without mention of shunt/bubble  Otherwise pertinent review of systems is negative.  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   Stroke ([place])    Syncope      Family History  Problem Relation Age of Onset   Asthma Mother    Allergic rhinitis Mother    Cancer Mother    Allergic rhinitis Father    Asthma Sister    Allergic rhinitis Sister    Diabetes Sister    Allergic rhinitis Brother    Colon cancer Brother    Stroke Brother    Arrhythmia Brother    Allergic rhinitis Brother    Congenital heart disease Brother    Heart attack Brother    Lung disease Son        Sarcoid vs ARDS related fibrosis,  required 2 double lung transplants   Esophageal cancer Neg Hx    Rectal cancer Neg Hx    Stomach cancer Neg Hx      Past Surgical History:  Procedure Laterality Date   COLONOSCOPY     FOOT SURGERY  1998   Implantable loop recorder placement  09/16/14   MDT LINQ implanted by Dr Rayann Heman for cryptogenic stroke, RIO II protocol   LOOP RECORDER REMOVAL N/A 10/02/2017   Procedure: LOOP RECORDER REMOVAL;  Surgeon: [name], MD;  Location: [facility];  Service: Cardiovascular;  Laterality: N/A;   NASAL POLYP SURGERY  07/2006   x 2 with Dr. [name]   NASAL SINUS SURGERY  07/2006   Dr. [name]   TUBAL LIGATION      Social History   Socioeconomic History   Marital status: Widowed    Spouse name: Not on file   Number of children: 3   Years of education: Not on file   Highest education level: Not on file  Occupational History   Occupation: Publishing copy    Comment: [employer]  Tobacco Use   Smoking status: Never    Passive exposure: Current (on sister's clothing bc sister is a smoker)   Smokeless tobacco: Never  Vaping Use   Vaping Use: Never used  Substance and Sexual Activity   Alcohol use: Yes    Comment: socially  wine   Drug use: No   Sexual activity: Not Currently    Birth control/protection: Post-menopausal  Other Topics Concern   Not on file  Social History Narrative   Widowed, husband died in 02-20-2006   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   Patient lives alone.   Social Determinants of Health   Financial Resource Strain: Not on file  Food Insecurity: Not on file  Transportation Needs: Not on file  Physical Activity: Not on file  Stress: Not on file  Social Connections: Not on file  Intimate Partner Violence: Not on file     Allergies  Allergen Reactions   Iodine Nausea And Vomiting and Rash    Reaction: hot flashes   Ivp Dye [Iodinated Contrast Media] Nausea And Vomiting and  Other (See Comments)    hot flashes   Promethazine-Codeine Nausea And Vomiting   Tramadol Other (See Comments)    Almost passed out     Outpatient Medications Prior to Visit  Medication Sig Dispense Refill   alendronate (FOSAMAX) 70 MG tablet Take 1 tablet (70 mg total) by mouth every 7 (seven) days. Take with a full glass of water on an empty stomach. 12 tablet 3   albuterol (VENTOLIN HFA) 108 (90 Base) MCG/ACT inhaler Inhale 1-2 puffs into the lungs every 6 (six) hours as needed for wheezing or shortness of breath. 1 each 11   amoxicillin-clavulanate (AUGMENTIN) 875-125 MG tablet Take 1 tablet by mouth 2 (two) times daily. 20 tablet 11   apixaban (ELIQUIS) 5 MG TABS tablet Take 1 tablet (5 mg total) by mouth 2 (two) times daily. 60 tablet 5   aspirin EC 81 MG tablet Take 1 tablet (81 mg total) by mouth daily. Swallow whole. 30 tablet 12   Biotin 10000 MCG TABS Take 10,000 mcg by mouth daily.     budesonide-formoterol (SYMBICORT) 80-4.5 MCG/ACT inhaler Inhale 2 puffs into the lungs 2 (two) times daily. 10.2 g 11   cetirizine (ZYRTEC) 10 MG tablet Take 10 mg by mouth at bedtime.      Cholecalciferol (VITAMIN D3) 2000 units TABS Take 2,000 Units by mouth daily.     Cyanocobalamin (VITAMIN B-12 PO) Take by mouth daily.     dextromethorphan-guaiFENesin (MUCINEX DM) 30-600 MG 12hr tablet Take 1-2 tablets by mouth 2 (two) times daily as needed (for congestion/cough (WITH FLUTTER VALVE)).      doxycycline (VIBRA-TABS) 100 MG tablet Take 1 tablet (100 mg total) by mouth 2 (two) times daily. 20 tablet 0   fluticasone (FLONASE) 50 MCG/ACT nasal spray Place 2 sprays into both nostrils daily. 16 g 5   LAGEVRIO 200 MG CAPS capsule Take by mouth.     Mepolizumab (NUCALA) 100 MG/ML SOAJ Inject into the skin.     montelukast (SINGULAIR) 10 MG tablet Take 1 tablet (10 mg total) by mouth at bedtime. 30 tablet 11   oxymetazoline (AFRIN) 0.05 % nasal spray Place 2 sprays into both nostrils 2 (two) times daily  as needed (nasal stuffiness and nasal pressure for 5 days).      pantoprazole (PROTONIX) 40 MG tablet Take 1 tablet (40 mg total) by mouth daily. 90 tablet 1   predniSONE (DELTASONE) 5 MG tablet      Respiratory Therapy Supplies (FLUTTER) DEVI Use as directed     sodium chloride (OCEAN) 0.65 % SOLN nasal spray Place 2 sprays into both nostrils as needed (dry  nose).      Spacer/Aero-Holding Dorise Bullion by Does not apply route.     spironolactone (ALDACTONE) 25 MG tablet Take 1 tablet (25 mg total) by mouth daily. 90 tablet 2   Facility-Administered Medications Prior to Visit  Medication Dose Route Frequency Provider Last Rate Last Admin   mepolizumab (NUCALA) injection 100 mg  100 mg Subcutaneous Q28 days Valentina Shaggy, MD   100 mg at 01/08/23 1142       Objective:   Physical Exam:  General appearance: 72 y.o., female, NAD, conversant  Eyes: anicteric sclerae; PERRL, tracking appropriately HENT: NCAT; MMM Neck: Trachea midline; no lymphadenopathy, no JVD Lungs: CTAB, no crackles, no wheeze, with normal respiratory effort CV: RRR, no murmur  Abdomen: Soft, non-tender; non-distended, BS present  Extremities: +LLE edema, warm Skin: Normal turgor and texture; no rash Psych: Appropriate affect Neuro: Alert and oriented to person and place, no focal deficit     There were no vitals filed for this visit.      on RA BMI Readings from Last 3 Encounters:  12/05/22 32.88 kg/m  10/06/22 32.98 kg/m  09/29/22 36.07 kg/m   Wt Readings from Last 3 Encounters:  12/05/22 174 lb (78.9 kg)  10/06/22 177 lb 6.4 oz (80.5 kg)  09/29/22 178 lb 9.6 oz (81 kg)     CBC    Component Value Date/Time   WBC 4.9 09/21/2022 1631   RBC 3.60 (L) 09/21/2022 1631   HGB 10.8 (L) 09/21/2022 1631   HGB 13.2 04/11/2022 1628   HCT 34.1 (L) 09/21/2022 1631   HCT 36.4 04/11/2022 1628   PLT 231 09/21/2022 1631   MCV 94.7 09/21/2022 1631   MCV 89 04/11/2022 1628   MCH 30.0 09/21/2022  1631   MCHC 31.7 09/21/2022 1631   RDW 12.1 09/21/2022 1631   RDW 11.8 04/11/2022 1628   LYMPHSABS 2.0 09/21/2022 1631   LYMPHSABS 2.1 04/11/2022 1628   MONOABS 0.4 09/21/2022 1631   EOSABS 0.2 09/21/2022 1631   EOSABS 0.7 (H) 04/11/2022 1628   BASOSABS 0.0 09/21/2022 1631   BASOSABS 0.1 04/11/2022 1628    Eos 1400-1700!, IgE 2000! 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  # LLE DVT, ?related to covid infection, ?unprovoked She thinks LLE swelling preceded covid infection  # cryptogenic stroke Does not appear to have had bubble study in past  Plan: - symbicort 80 2 puffs twice daily rinse mouth after using - may need to increase ICS component when comes off of prednisone - singulair, flonase - nucala - consider antifungal if exacerbations despite nucala - albuterol as needed - TTE with bubble - would switch to baby aspirin 81 mg daily while on eliquis - eliquis for at least 3 months but may warrant consideration of indefinite treatment if it's not clear there's a provoking factor as long as she's not running into bleeding issues - see you in 3 months or sooner if need be!   RTC 3 months   Maryjane Hurter, MD [facility] 01/13/2023 5:00 PM

## 2023-01-15 ENCOUNTER — Ambulatory Visit (INDEPENDENT_AMBULATORY_CARE_PROVIDER_SITE_OTHER): Payer: Medicare HMO | Admitting: Student

## 2023-01-15 ENCOUNTER — Encounter: Payer: Self-pay | Admitting: Student

## 2023-01-15 VITALS — BP 122/80 | HR 74 | Temp 97.8°F | Ht 61.0 in | Wt 174.0 lb

## 2023-01-15 DIAGNOSIS — J8283 Eosinophilic asthma: Secondary | ICD-10-CM | POA: Diagnosis not present

## 2023-01-15 DIAGNOSIS — J328 Other chronic sinusitis: Secondary | ICD-10-CM | POA: Diagnosis not present

## 2023-01-15 DIAGNOSIS — B4481 Allergic bronchopulmonary aspergillosis: Secondary | ICD-10-CM

## 2023-01-15 NOTE — Patient Instructions (Addendum)
-   symbicort 2 puffs twice daily rinse mouth after using - contiue singulair, flonase - albuterol as needed - nucala with allergy - eliquis at least till follow up with hematology - see you in June or sooner if need be!

## 2023-01-16 ENCOUNTER — Other Ambulatory Visit: Payer: Self-pay | Admitting: Internal Medicine

## 2023-01-16 NOTE — Telephone Encounter (Signed)
Please refill as per office routine med refill policy (all routine meds to be refilled for 3 mo or monthly (per pt preference) up to one year from last visit, then month to month grace period for 3 mo, then further med refills will have to be denied) ? ?

## 2023-01-24 ENCOUNTER — Inpatient Hospital Stay: Payer: Medicare HMO | Attending: Hematology and Oncology | Admitting: Physician Assistant

## 2023-01-24 ENCOUNTER — Inpatient Hospital Stay: Payer: Medicare HMO

## 2023-01-24 VITALS — BP 132/84 | HR 74 | Temp 98.1°F | Resp 18 | Wt 174.2 lb

## 2023-01-24 DIAGNOSIS — I82402 Acute embolism and thrombosis of unspecified deep veins of left lower extremity: Secondary | ICD-10-CM

## 2023-01-24 DIAGNOSIS — Z800 Family history of malignant neoplasm of digestive organs: Secondary | ICD-10-CM

## 2023-01-24 LAB — CBC WITH DIFFERENTIAL (CANCER CENTER ONLY)
Abs Immature Granulocytes: 0.02 10*3/uL (ref 0.00–0.07)
Basophils Absolute: 0 10*3/uL (ref 0.0–0.1)
Basophils Relative: 1 %
Eosinophils Absolute: 0.1 10*3/uL (ref 0.0–0.5)
Eosinophils Relative: 2 %
HCT: 38.3 % (ref 36.0–46.0)
Hemoglobin: 12.9 g/dL (ref 12.0–15.0)
Immature Granulocytes: 1 %
Lymphocytes Relative: 38 %
Lymphs Abs: 1.6 10*3/uL (ref 0.7–4.0)
MCH: 30.8 pg (ref 26.0–34.0)
MCHC: 33.7 g/dL (ref 30.0–36.0)
MCV: 91.4 fL (ref 80.0–100.0)
Monocytes Absolute: 0.3 10*3/uL (ref 0.1–1.0)
Monocytes Relative: 7 %
Neutro Abs: 2.2 10*3/uL (ref 1.7–7.7)
Neutrophils Relative %: 51 %
Platelet Count: 194 10*3/uL (ref 150–400)
RBC: 4.19 MIL/uL (ref 3.87–5.11)
RDW: 12.9 % (ref 11.5–15.5)
WBC Count: 4.2 10*3/uL (ref 4.0–10.5)
nRBC: 0 % (ref 0.0–0.2)

## 2023-01-24 LAB — CMP (CANCER CENTER ONLY)
ALT: 10 U/L (ref 0–44)
AST: 19 U/L (ref 15–41)
Albumin: 3.8 g/dL (ref 3.5–5.0)
Alkaline Phosphatase: 86 U/L (ref 38–126)
Anion gap: 11 (ref 5–15)
BUN: 13 mg/dL (ref 8–23)
CO2: 27 mmol/L (ref 22–32)
Calcium: 9.8 mg/dL (ref 8.9–10.3)
Chloride: 103 mmol/L (ref 98–111)
Creatinine: 0.99 mg/dL (ref 0.44–1.00)
GFR, Estimated: >60 mL/min (ref >60)
Glucose, Bld: 85 mg/dL (ref 70–99)
Potassium: 4.3 mmol/L (ref 3.5–5.1)
Sodium: 141 mmol/L (ref 135–145)
Total Bilirubin: 0.7 mg/dL (ref 0.3–1.2)
Total Protein: 6.8 g/dL (ref 6.5–8.1)

## 2023-01-24 NOTE — Progress Notes (Signed)
[place] Telephone:(336) [phone]   Fax:([phone]) [place] NOTE  Patient Care Team: Biagio Borg, MD as PCP - General Garvin Fila, MD as Consulting Physician (Neurology) [place], Darnelle Maffucci, [facility] (Inactive) as Pharmacist (Pharmacist)  Hematological/Oncological History 08/24/2022:Presented with left lower extremity swelling x 2-3 months. Doppler US showed acute and occlusive deep vein thrombosis involving the common femoral vein, femoral vein, popliteal vein, posterior  tibial veins, and peroneal veins.  The external iliac vein is consistent with age indeterminate and partially recanalized thrombus. Started on Eliquis therapy 09/21/2022: Doppler US: Findings consistent with age indeterminate deep vein thrombosis  involving the left common femoral vein, SF junction, left femoral vein,  left proximal profunda vein, left popliteal vein, and left peroneal veins.  12/23/2022:Doppler US:Findings consistent with chronic deep vein thrombosis involving the left  femoral vein, and left popliteal vein. When compared to prior studies,  thrombus in the common femoral, saphenofemoral junction, posterior tibial  veins, and peroneal veins appears to have resolved.  01/24/2023: Establish care with [facility]ematology  CHIEF COMPLAINTS/PURPOSE OF CONSULTATION:  "Left lower extremity DVT"  HISTORY OF PRESENTING ILLNESS:  Andrea Mason 72 y.o. female with medical history significant for GERD, DJD, hypertension and stroke presents to the hematology clinic for evaluation history of left lower extremity DVT. She is unaccompanied for this visit.   On exam today, Andrea Mason reports that she is tolerating Eliquis therapy without any side effects. She reports the swelling in her left lower extremity has improved with anticoagulation. She denies easy bruising or signs of bleeding. She is otherwise feeling well. She denies fevers, chills, sweats, shortness of breath, chest pain, cough,  nausea, vomiting or diarrhea. Rest of the 10 point ROS is below.   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 [name]   NASAL SINUS SURGERY  07/2006   Dr. [name]   TUBAL LIGATION      SOCIAL HISTORY: Social History   Socioeconomic History   Marital status: Widowed    Spouse name: Not on file   Number of children: 3   Years of education: Not on file   Highest education level: Not on file  Occupational History   Occupation: Publishing copy    Comment: [employer]  Tobacco Use   Smoking status: Never    Passive exposure: Current (on sister's clothing bc sister is a smoker)   Smokeless tobacco: Never  Vaping Use   Vaping Use: Never used  Substance and Sexual Activity   Alcohol use: Yes    Comment: socially wine  Drug use: No   Sexual activity: Not Currently    Birth control/protection: Post-menopausal  Other Topics Concern   Not on file  Social History Narrative   Widowed, husband died in Feb 18, 2006   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   Patient lives alone.   Social Determinants of Health   Financial Resource Strain: Not on  file  Food Insecurity: No Food Insecurity (01/24/2023)   Hunger Vital Sign    Worried About Running Out of Food in the Last Year: Never true    Ran Out of Food in the Last Year: Never true  Transportation Needs: No Transportation Needs (01/24/2023)   PRAPARE - Hydrologist (Medical): No    Lack of Transportation (Non-Medical): No  Physical Activity: Not on file  Stress: Not on file  Social Connections: Not on file  Intimate Partner Violence: Not on file    FAMILY HISTORY: Family History  Problem Relation Age of Onset   Asthma Mother    Allergic rhinitis Mother    Cancer Mother    Allergic rhinitis Father    Asthma Sister    Allergic rhinitis Sister    Diabetes Sister    Allergic rhinitis Brother    Colon cancer Brother    Stroke Brother    Arrhythmia Brother    Allergic rhinitis Brother    Congenital heart disease Brother    Heart attack Brother    Lung disease Son        Sarcoid vs ARDS related fibrosis, required 2 double lung transplants   Esophageal cancer Neg Hx    Rectal cancer Neg Hx    Stomach cancer Neg Hx     ALLERGIES:  is allergic to iodine, ivp dye [iodinated contrast media], promethazine-codeine, and tramadol.  MEDICATIONS:  Current Outpatient Medications  Medication Sig Dispense Refill   albuterol (VENTOLIN HFA) 108 (90 Base) MCG/ACT inhaler Inhale 1-2 puffs into the lungs every 6 (six) hours as needed for wheezing or shortness of breath. 1 each 11   alendronate (FOSAMAX) 70 MG tablet Take 1 tablet (70 mg total) by mouth every 7 (seven) days. Take with a full glass of water on an empty stomach. 12 tablet 3   apixaban (ELIQUIS) 5 MG TABS tablet Take 1 tablet (5 mg total) by mouth 2 (two) times daily. 60 tablet 5   aspirin EC 81 MG tablet Take 1 tablet (81 mg total) by mouth daily. Swallow whole. 30 tablet 12   Biotin 10000 MCG TABS Take 10,000 mcg by mouth daily.     budesonide-formoterol (SYMBICORT) 80-4.5 MCG/ACT inhaler Inhale  2 puffs into the lungs 2 (two) times daily. 10.2 g 11   cetirizine (ZYRTEC) 10 MG tablet Take 10 mg by mouth at bedtime.      Cholecalciferol (VITAMIN D3) 2000 units TABS Take 2,000 Units by mouth daily.     Cyanocobalamin (VITAMIN B-12 PO) Take 1,000 mcg by mouth daily.     dextromethorphan-guaiFENesin (MUCINEX DM) 30-600 MG 12hr tablet Take 1-2 tablets by mouth 2 (two) times daily as needed (for congestion/cough (WITH FLUTTER VALVE)).      fluticasone (FLONASE) 50 MCG/ACT nasal spray Place 2 sprays into both nostrils daily. 16 g 5   Mepolizumab (NUCALA) 100 MG/ML SOAJ Inject into the skin.     montelukast (SINGULAIR) 10 MG tablet Take 1 tablet (10 mg total) by mouth at bedtime. 30 tablet  11   oxymetazoline (AFRIN) 0.05 % nasal spray Place 2 sprays into both nostrils 2 (two) times daily as needed (nasal stuffiness and nasal pressure for 5 days).      pantoprazole (PROTONIX) 40 MG tablet TAKE 1 TABLET(40 MG) BY MOUTH DAILY 90 tablet 1   Respiratory Therapy Supplies (FLUTTER) DEVI Use as directed     sodium chloride (OCEAN) 0.65 % SOLN nasal spray Place 2 sprays into both nostrils as needed (dry nose).      Spacer/Aero-Holding Dorise Bullion by Does not apply route.     spironolactone (ALDACTONE) 25 MG tablet Take 1 tablet (25 mg total) by mouth daily. 90 tablet 2   Current Facility-Administered Medications  Medication Dose Route Frequency Provider Last Rate Last Admin   mepolizumab (NUCALA) injection 100 mg  100 mg Subcutaneous Q28 days Valentina Shaggy, MD   100 mg at 01/08/23 1142    REVIEW OF SYSTEMS:   Constitutional: ( - ) fevers, ( - )  chills , ( - ) night sweats Eyes: ( - ) blurriness of vision, ( - ) double vision, ( - ) watery eyes Ears, nose, mouth, throat, and face: ( - ) mucositis, ( - ) sore throat Respiratory: ( - ) cough, ( - ) dyspnea, ( - ) wheezes Cardiovascular: ( - ) palpitation, ( - ) chest discomfort, ( - ) lower extremity swelling Gastrointestinal:  ( - ) nausea,  ( - ) heartburn, ( - ) change in bowel habits Skin: ( - ) abnormal skin rashes Lymphatics: ( - ) new lymphadenopathy, ( - ) easy bruising Neurological: ( - ) numbness, ( - ) tingling, ( - ) new weaknesses Behavioral/Psych: ( - ) mood change, ( - ) new changes  All other systems were reviewed with the patient and are negative.  PHYSICAL EXAMINATION: ECOG PERFORMANCE STATUS: 0 - Asymptomatic  Vitals:   01/24/23 1428  BP: 132/84  Pulse: 74  Resp: 18  Temp: 98.1 F (36.7 C)  SpO2: 97%   Filed Weights   01/24/23 1428  Weight: 174 lb 3.2 oz (79 kg)    GENERAL: well appearing female in NAD  SKIN: skin color, texture, turgor are normal, no rashes or significant lesions EYES: conjunctiva are pink and non-injected, sclera clear OROPHARYNX: no exudate, no erythema; lips, buccal mucosa, and tongue normal  NECK: supple, non-tender LUNGS: clear to auscultation and percussion with normal breathing effort HEART: regular rate & rhythm and no murmurs and no lower extremity edema Musculoskeletal: no cyanosis of digits and no clubbing  PSYCH: alert & oriented x 3, fluent speech NEURO: no focal motor/sensory deficits  LABORATORY DATA:  I have reviewed the data as listed    Latest Ref Rng & Units 09/21/2022    4:31 PM 08/23/2022    2:19 PM 07/01/2022    5:57 AM  CBC  WBC 4.0 - 10.5 K/uL 4.9  6.3  5.8   Hemoglobin 12.0 - 15.0 g/dL 10.8  11.5  12.0   Hematocrit 36.0 - 46.0 % 34.1  34.6  36.0   Platelets 150 - 400 K/uL 231  203.0  165        Latest Ref Rng & Units 11/29/2022    2:17 PM 09/21/2022    4:31 PM 08/23/2022    2:19 PM  CMP  Glucose 70 - 99 mg/dL 99  86  73   BUN 6 - 23 mg/dL '11  8  12   '$ Creatinine 0.40 - 1.20 mg/dL 0.95  0.95  1.08   Sodium 135 - 145 mEq/L 139  139  136   Potassium 3.5 - 5.1 mEq/L 4.2  4.1  3.9   Chloride 96 - 112 mEq/L 103  106  100   CO2 19 - 32 mEq/L '30  28  31   '$ Calcium 8.4 - 10.5 mg/dL 8.6 - 10.4 mg/dL 9.7    9.3  9.2  9.4   Total Protein 6.5 -  8.1 g/dL  7.3  7.5   Total Bilirubin 0.3 - 1.2 mg/dL  1.1  0.7   Alkaline Phos 38 - 126 U/L  82  95   AST 15 - 41 U/L  17  15   ALT 0 - 44 U/L  9  9     RADIOGRAPHIC STUDIES: I have personally reviewed the radiological images as listed and agreed with the findings in the report. VAS [place] LOWER EXTREMITY VENOUS (DVT)  Result Date: 01/02/2023  Lower Venous DVT Study Patient Name:  Andrea Mason  Date of Exam:   01/02/2023 Medical Rec #: [id number]       Accession #:    [id number] Date of Birth: 05/24/1951        Patient Gender: F Patient Age:   41 years Exam Location:  [facility] Procedure:      VAS [place] LOWER EXTREMITY VENOUS (DVT) Referring Phys: Vonna Kotyk ROBINS --------------------------------------------------------------------------------  Indications: Follow up left lower extremity DVT.  Comparison Study: 09/21/2022 Left lower extremity venous duplex- Findings                   consistent with age indeterminate deep vein thrombosis                   involving the left common femoral vein, SF junction, left                   femoral vein, left proximal profunda vein, left popliteal                   vein, and left peroneal veins.                   - No cystic structure found in the popliteal fossa.                    08/24/22 left lower extremity venous duplex- Findings                   consistent with acute and occlusive deep vein thrombosis                   involving the common femoral vein, femoral vein, popliteal                   vein, posterior tibial veins, and peroneal veins.                    - The IVC and common iliac veins were not adequately                   visualized.                   - The external iliac vein is consistent with age indeterminate                   and partially recanalized thrombus. Performing Technologist: Maudry Mayhew MHA, RDMS, RVT, RDCS  Examination Guidelines: A complete  evaluation includes B-mode imaging, spectral Doppler, color Doppler, and  power Doppler as needed of all accessible portions of each vessel. Bilateral testing is considered an integral part of a complete examination. Limited examinations for reoccurring indications may be performed as noted. The reflux portion of the exam is performed with the patient in reverse Trendelenburg.  +-----+---------------+---------+-----------+----------+--------------+ RIGHTCompressibilityPhasicitySpontaneityPropertiesThrombus Aging +-----+---------------+---------+-----------+----------+--------------+ CFV  Full           Yes      Yes                                 +-----+---------------+---------+-----------+----------+--------------+   +---------+---------------+---------+-----------+----------+--------------+ LEFT     CompressibilityPhasicitySpontaneityPropertiesThrombus Aging +---------+---------------+---------+-----------+----------+--------------+ CFV      Full           Yes      Yes                                 +---------+---------------+---------+-----------+----------+--------------+ SFJ      Full                                                        +---------+---------------+---------+-----------+----------+--------------+ FV Prox  Partial                 Yes                  Chronic        +---------+---------------+---------+-----------+----------+--------------+ FV Mid   Partial                 Yes                  Chronic        +---------+---------------+---------+-----------+----------+--------------+ FV DistalPartial                 Yes                  Chronic        +---------+---------------+---------+-----------+----------+--------------+ PFV      Full                                                        +---------+---------------+---------+-----------+----------+--------------+ POP      Partial        Yes      Yes                  Chronic         +---------+---------------+---------+-----------+----------+--------------+ PTV      Full                                                        +---------+---------------+---------+-----------+----------+--------------+ PERO     Full                                                        +---------+---------------+---------+-----------+----------+--------------+  Summary: RIGHT: - No evidence of common femoral vein obstruction.  LEFT: - Findings consistent with chronic deep vein thrombosis involving the left femoral vein, and left popliteal vein. When compared to prior studies, thrombus in the common femoral, saphenofemoral junction, posterior tibial veins, and peroneal veins appears to have resolved. - No cystic structure found in the popliteal fossa.  *See table(s) above for measurements and observations. Electronically signed by Monica Martinez MD on 01/02/2023 at 4:23:31 PM.    Final    DG Bone Density  Result Date: 12/26/2022 Table formatting from the original result was not included. Date of study: 12/26/2022 Exam: DUAL X-RAY ABSORPTIOMETRY (DXA) FOR BONE MINERAL DENSITY (BMD) Instrument: [employer] Requesting Provider: PCP Indication: Screening for low BMD Comparison: none (please note that it is not possible to compare data from different instruments) Clinical data: Pt is a 72 y.o. female without previous history of fracture. On calcium and vitamin D. Results:  Lumbar spine L1-L4 (L3) Femoral neck (FN) 33% distal radius T-score -1.1 RFN: -1.9 LFN: -1.4 n/a Assessment: the BMD is low according to the [facility] classification for osteoporosis (see below). Fracture risk: moderate FRAX score: 10 year major osteoporotic risk: 4.7%. 10 year hip fracture risk: 0.8%. The thresholds for treatment are 20% and 3%, respectively. Comments: the technical quality of the study is good, however, L3 vertebra had to be excluded from analysis due to degenerative changes. Evaluation for secondary causes  should be considered if clinically indicated. Recommend optimizing calcium (1200 mg/day) and vitamin D (800 IU/day) intake. Followup: Repeat BMD is appropriate after 1-2 years. WHO criteria for diagnosis of osteoporosis in postmenopausal women and in men 9 y/o or older: - normal: T-score -1.0 to + 1.0 - osteopenia/low bone density: T-score between -2.5 and -1.0 - osteoporosis: T-score below -2.5 - severe osteoporosis: T-score below -2.5 with history of fragility fracture Note: although not part of the WHO classification, the presence of a fragility fracture, regardless of the T-score, should be considered diagnostic of osteoporosis, provided other causes for the fracture have been excluded. Treatment: The National Osteoporosis Foundation recommends that treatment be considered in postmenopausal women and men age 7 or older with: 1. Hip or vertebral (clinical or morphometric) fracture 2. T-score of - 2.5 or lower at the spine or hip 3. 10-year fracture probability by FRAX of at least 20% for a major osteoporotic fracture and 3% for a hip fracture Philemon Kingdom, MD [facility]    ASSESSMENT & PLAN Andrea Mason is a 72 y.o. female who presents to the clinic for evaluation of left lower extremity DVT diagnosed in September 2023.   A provoked venous thromboembolism (VTE) is one that has a clear inciting factor or event. Provoking factors include prolonged travel/immobility, surgery (particular abdominal or orthropedic), trauma,  and pregnancy/ estrogen containing birth control. After a detailed history and review of the records there is no clear provoking factor for this patient's VTE.  Patients with unprovoked VTEs have up to 25% recurrence after 5 years and 36% at 10 years, with 4% of these clots being fatal ([phone]). Therefore the formal recommendation for unprovoked VTE's is lifelong anticoagulation, as the cause may not be transient or reversible. We recommend 6 months or full  strength anticoagulation with a re-evaluation after that time.  The patient's will then have a choice of maintenance dose DOAC (preferred, recommended), '81mg'$  ASA PO daily (non-preferred), or no further anticoagulation (not recommended).   #Unprovoked LLE DVT --findings at this time are consistent with  a unprovoked VTE --will order baseline CMP and CBC to assure labs are adequate for DOAC therapy --will order cardiolipin abs and beta-2 glycoprotein abs to check for antiphospholipid syndrome  --recommend the patient continue eliquis '5mg'$  BID. She will have completed 6 months of therapy next month so okay to transition to maintenance dose at that time. --patient denies any bleeding, bruising, or dark stools on this medication. It is well tolerated. No difficulties accessing/affording the medication --RTC in 6 months' time with strict return precautions for overt signs of bleeding.    No orders of the defined types were placed in this encounter.   All questions were answered. The patient knows to call the clinic with any problems, questions or concerns.  I have spent a total of 60 minutes minutes of face-to-face and non-face-to-face time, preparing to see the patient, obtaining and/or reviewing separately obtained history, performing a medically appropriate examination, counseling and educating the patient, ordering tests/procedures,documenting clinical information in the electronic health record,  and care coordination.   Dede Query, PA-C Department of Hematology/Oncology [place] at [facility] Phone: [phone]  Patient was seen with Dr. Lorenso Courier  I have read the above note and personally examined the patient. I agree with the assessment and plan as noted above.  Briefly Andrea Mason is a 72 year old female with medical history significant for recurrent VTE's.  At this time would recommend continuation of DOAC therapy, and but in order to assure this is the  optimal therapy will order APS antibodies to rule out this condition.  In the event she is found to have APS would need to convert to Coumadin therapy, but otherwise DOAC therapy would be more than adequate.  Will plan to see the patient back in approximately 6 months time pending the results of her above labs.  The patient voiced understanding of the need for indefinite anticoagulation was willing and able to proceed with treatment.   Ledell Peoples, MD Department of Hematology/Oncology [place] at [facility] Phone: [phone] Pager: [phone] Email: [email or web address]

## 2023-01-26 LAB — BETA-2-GLYCOPROTEIN I ABS, IGG/M/A
Beta-2 Glyco I IgG: <9 GPI IgG units (ref 0–20)
Beta-2-Glycoprotein I IgA: <9 GPI IgA units (ref 0–25)
Beta-2-Glycoprotein I IgM: <9 GPI IgM units (ref 0–32)

## 2023-01-26 LAB — CARDIOLIPIN ANTIBODIES, IGG, IGM, IGA
Anticardiolipin IgA: <9 APL U/mL (ref 0–11)
Anticardiolipin IgG: <9 GPL U/mL (ref 0–14)
Anticardiolipin IgM: <9 MPL U/mL (ref 0–12)

## 2023-01-31 ENCOUNTER — Telehealth: Payer: Self-pay | Admitting: Physician Assistant

## 2023-01-31 NOTE — Telephone Encounter (Signed)
I called and left a voicemail for Andrea Mason to review the lab results from 01/24/2023. There is no evidence of antiphospholipid syndrome. Recommend to continue on Eliquis therapy and return in 6 months with repeat labs. Patient expressed understanding of the plan provided.

## 2023-02-05 ENCOUNTER — Ambulatory Visit: Payer: Medicare HMO

## 2023-02-06 ENCOUNTER — Ambulatory Visit: Payer: Medicare HMO

## 2023-02-13 ENCOUNTER — Ambulatory Visit (INDEPENDENT_AMBULATORY_CARE_PROVIDER_SITE_OTHER): Payer: Medicare HMO

## 2023-02-13 DIAGNOSIS — J4550 Severe persistent asthma, uncomplicated: Secondary | ICD-10-CM | POA: Diagnosis not present

## 2023-03-13 ENCOUNTER — Ambulatory Visit (INDEPENDENT_AMBULATORY_CARE_PROVIDER_SITE_OTHER): Payer: Medicare HMO

## 2023-03-13 DIAGNOSIS — J4550 Severe persistent asthma, uncomplicated: Secondary | ICD-10-CM

## 2023-04-10 ENCOUNTER — Ambulatory Visit (INDEPENDENT_AMBULATORY_CARE_PROVIDER_SITE_OTHER): Payer: Medicare HMO

## 2023-04-10 DIAGNOSIS — J4550 Severe persistent asthma, uncomplicated: Secondary | ICD-10-CM

## 2023-04-12 ENCOUNTER — Ambulatory Visit: Payer: Medicare HMO | Admitting: Family Medicine

## 2023-04-12 ENCOUNTER — Ambulatory Visit
Admission: EM | Admit: 2023-04-12 | Discharge: 2023-04-12 | Disposition: A | Discharge status: Home / Self Care | Payer: Medicare HMO | Attending: Family Medicine | Admitting: Family Medicine

## 2023-04-12 DIAGNOSIS — R197 Diarrhea, unspecified: Secondary | ICD-10-CM | POA: Diagnosis not present

## 2023-04-12 LAB — POCT URINALYSIS DIP (MANUAL ENTRY)
Bilirubin, UA: NEGATIVE
Blood, UA: NEGATIVE
Glucose, UA: NEGATIVE mg/dL
Ketones, POC UA: NEGATIVE mg/dL
Leukocytes, UA: NEGATIVE
Nitrite, UA: NEGATIVE
Protein Ur, POC: NEGATIVE mg/dL
Spec Grav, UA: 1.015 (ref 1.010–1.025)
Urobilinogen, UA: 0.2 E.U./dL
pH, UA: 8.5 — AB (ref 5.0–8.0)

## 2023-04-12 MED ORDER — LOPERAMIDE HCL 2 MG PO CAPS: 2.0000 mg | ORAL_CAPSULE | Freq: Four times a day (QID) | ORAL | 0 refills | Status: DC | PRN

## 2023-04-12 NOTE — Discharge Instructions (Signed)
If your symptoms worsen or do not improve follow-up with your primary care provider or go to the emergency department. Take loperamide as needed for loose stools. Continue with all other medications that you are prescribed.

## 2023-04-12 NOTE — ED Triage Notes (Signed)
Pt states diarrhea for the past 4 days.

## 2023-04-12 NOTE — ED Provider Notes (Signed)
[name]    CSN: [id number] Arrival date & time: 04/12/23  1143      History   Chief Complaint Chief Complaint  Patient presents with   Diarrhea    HPI Andrea Mason is a 72 y.o. female.   HPI Patient with a history of GERD, DVT, prescribed chronic anticoagulant therapy presents today with reporting frequent loose stools occurring in the morning after she drinks coffee x 4 days.  She reports that she has to drink coffee every morning for several years and has never had 4 days of consecutive loose stools.  She reports that she takes her medication every morning and does not routinely eat breakfast prior to taking her medications or with her medications.  She denies any upset stomach.  She does have a history of GERD.  She reports she has been having tingling constipated compared to having loose stools.  On evaluation patient vital signs are stable.  She has had no nausea or vomiting.  No known sick contacts.  She reports her baseline appetite as she mostly eats at nighttime when she works third shift.  She has no other complaints. Past Medical History:  Diagnosis Date   Allergic rhinitis    Allergy    Asthma    Depression    DJD (degenerative joint disease), cervical    DVT (deep venous thrombosis) (HCC)    GERD (gastroesophageal reflux disease)    History of colonic polyps    Hypertension    Obesity    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   Stroke [facility])    Syncope     Patient Active Problem List   Diagnosis Date Noted   Leg DVT (deep venous thromboembolism), acute, left (HCC) 09/02/2022   History of stroke 09/02/2022   Lesion of right pinna 08/25/2022   Pain and swelling of left lower leg 08/23/2022   Ankle edema, bilateral 08/01/2022   Elevated serum creatinine 08/01/2022   Bilateral hip joint arthritis 07/16/2022   Right sided sciatica 07/14/2022   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   Recurrent infections  04/13/2022   Anemia 03/06/2022   Thrush 04/12/2021   Urinary frequency 09/22/2020   Wrist pain, acute, left 09/09/2020   CKD (chronic kidney disease) stage 3, GFR 30-59 ml/min (HCC) 12/31/2019   Painful legs and moving toes of right foot 12/16/2019   Skin nodule 11/16/2019   Nocturia 11/16/2019   Low back pain 11/16/2019   Acute recurrent pansinusitis 02/17/2019   Alopecia 01/31/2019   Upper airway cough syndrome 06/14/2018   Rash 05/30/2018   Vitamin D deficiency 08/31/2017   Facial contusion, subsequent encounter 03/12/2017   Vertigo 03/12/2017   Right rotator cuff tear 07/27/2016   Right shoulder pain 07/11/2016   Overactive bladder 01/19/2016   Cough variant asthma 09/07/2015   Burn 09/06/2015   Vocal cord dysfunction 04/05/2015   Asthma with exacerbation 04/05/2015   Left knee pain 03/12/2015   Right knee pain 03/12/2015   Cough 12/08/2014   Skin lumps, generalized 09/08/2014   Toe pain, right 08/06/2014   Pre-ulcerative corn or callous 08/06/2014   Hammertoe 08/06/2014   Swelling of joint, ankle, right 08/06/2014   Pansinusitis 06/02/2014   Cerebral thrombosis with cerebral infarction (HCC) 05/26/2014   Weakness 05/25/2014   Right arm pain 05/25/2014   Numbness in right leg 05/25/2014   Numbness and tingling of right arm 05/25/2014   Embolic stroke (HCC) 03/31/2014   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  Alterations of sensations, late effect of cerebrovascular disease(438.6) 02/10/2014   Cerebral infarction (HCC) 11/25/2013   Hyperlipidemia 11/24/2013   Syncope 11/20/2013   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   Back pain 11/06/2013   Hypokalemia 05/06/2013   Family history of colon cancer 05/06/2013   Lipoma 09/03/2012   Hyperglycemia 08/15/2012   Constipation 01/02/2012   Encounter for well adult exam with abnormal findings 05/24/2011   Seasonal allergic rhinitis due to pollen 02/16/2011   OSTEOARTHRITIS, CERVICAL SPINE  02/09/2011   HYPERSOMNIA WITH SLEEP APNEA UNSPECIFIED 01/26/2011   UTI (urinary tract infection) 11/15/2010   NASAL POLYP 07/28/2010   FIBROIDS, UTERUS 07/27/2010   COMPUTERIZED TOMOGRAPHY, CHEST, ABNORMAL 09/16/2009   Seasonal and perennial allergic rhinitis 05/29/2009   PERIPHERAL EDEMA 05/28/2009   Mild persistent asthma, uncomplicated 11/23/2008   LEG PAIN, LEFT 06/30/2008   COLONIC POLYPS, HX OF 06/30/2008   OBESITY 12/12/2007   Depression 12/12/2007   Essential hypertension 12/12/2007   RHINOSINUSITIS, CHRONIC 12/12/2007   GERD 12/12/2007   ARTHRITIS 12/12/2007    Past Surgical History:  Procedure Laterality Date   COLONOSCOPY     FOOT SURGERY  1998   Implantable loop recorder placement  09/16/14   MDT LINQ implanted by Dr Johney Frame for cryptogenic stroke, RIO II protocol   LOOP RECORDER REMOVAL N/A 10/02/2017   Procedure: LOOP RECORDER REMOVAL;  Surgeon: Hillis Range, MD;  Location: [facility];  Service: Cardiovascular;  Laterality: N/A;   NASAL POLYP SURGERY  07/2006   x 2 with Dr. Annalee Genta   NASAL SINUS SURGERY  07/2006   Dr. Annalee Genta   TUBAL LIGATION      OB History   No obstetric history on file.      Home Medications    Prior to Admission medications   Medication Sig Start Date End Date Taking? Authorizing Provider  loperamide (IMODIUM) 2 MG capsule Take 1 capsule (2 mg total) by mouth 4 (four) times daily as needed for diarrhea or loose stools. 04/12/23  Yes Bing Neighbors, NP  albuterol (VENTOLIN HFA) 108 (90 Base) MCG/ACT inhaler Inhale 1-2 puffs into the lungs every 6 (six) hours as needed for wheezing or shortness of breath. 05/23/22   Omar Person, MD  alendronate (FOSAMAX) 70 MG tablet Take 1 tablet (70 mg total) by mouth every 7 (seven) days. Take with a full glass of water on an empty stomach. 12/26/22   Corwin Levins, MD  apixaban (ELIQUIS) 5 MG TABS tablet Take 1 tablet (5 mg total) by mouth 2 (two) times daily. 09/01/22   Corwin Levins, MD  aspirin EC 81 MG tablet Take 1 tablet (81 mg total) by mouth daily. Swallow whole. 09/01/22   Corwin Levins, MD  Biotin 13086 MCG TABS Take 10,000 mcg by mouth daily.    [provider]  budesonide-formoterol (SYMBICORT) 80-4.5 MCG/ACT inhaler Inhale 2 puffs into the lungs 2 (two) times daily. 07/05/22   Nyoka Cowden, MD  cetirizine (ZYRTEC) 10 MG tablet Take 10 mg by mouth at bedtime.     [provider]  Cholecalciferol (VITAMIN D3) 2000 units TABS Take 2,000 Units by mouth daily.    [provider]  Cyanocobalamin (VITAMIN B-12 PO) Take 1,000 mcg by mouth daily.    [provider]  dextromethorphan-guaiFENesin (MUCINEX DM) 30-600 MG 12hr tablet Take 1-2 tablets by mouth 2 (two) times daily as needed (for congestion/cough (WITH FLUTTER VALVE)).     [provider]  fluticasone Aleda Grana)  50 MCG/ACT nasal spray Place 2 sprays into both nostrils daily. 06/10/21   Nyoka Cowden, MD  Mepolizumab (NUCALA) 100 MG/ML SOAJ Inject into the skin.    [provider]  montelukast (SINGULAIR) 10 MG tablet Take 1 tablet (10 mg total) by mouth at bedtime. 06/13/22   Nyoka Cowden, MD  oxymetazoline (AFRIN) 0.05 % nasal spray Place 2 sprays into both nostrils 2 (two) times daily as needed (nasal stuffiness and nasal pressure for 5 days).     [provider]  pantoprazole (PROTONIX) 40 MG tablet TAKE 1 TABLET(40 MG) BY MOUTH DAILY 01/16/23   Corwin Levins, MD  Respiratory Therapy Supplies (FLUTTER) DEVI Use as directed    [provider]  sodium chloride (OCEAN) 0.65 % SOLN nasal spray Place 2 sprays into both nostrils as needed (dry nose).     [provider]  Spacer/Aero-Holding Rudean Curt by Does not apply route.    [provider]  spironolactone (ALDACTONE) 25 MG tablet Take 1 tablet (25 mg total) by mouth daily. 07/05/22   Corwin Levins, MD    Family History Family History  Problem Relation Age of Onset    Asthma Mother    Allergic rhinitis Mother    Cancer Mother    Allergic rhinitis Father    Asthma Sister    Allergic rhinitis Sister    Diabetes Sister    Allergic rhinitis Brother    Colon cancer Brother    Stroke Brother    Arrhythmia Brother    Allergic rhinitis Brother    Congenital heart disease Brother    Heart attack Brother    Lung disease Son        Sarcoid vs ARDS related fibrosis, required 2 double lung transplants   Esophageal cancer Neg Hx    Rectal cancer Neg Hx    Stomach cancer Neg Hx     Social History Social History   Tobacco Use   Smoking status: Never    Passive exposure: Current (on sister's clothing bc sister is a smoker)   Smokeless tobacco: Never  Vaping Use   Vaping Use: Never used  Substance Use Topics   Alcohol use: Yes    Comment: socially wine   Drug use: No     Allergies   Iodine, Ivp dye [iodinated contrast media], Promethazine-codeine, and Tramadol   Review of Systems Review of Systems Pertinent negatives listed in HPI   Physical Exam Triage Vital Signs ED Triage Vitals  Enc Vitals Group     BP 04/12/23 1308 139/79     Pulse --      Resp 04/12/23 1308 16     Temp 04/12/23 1308 (!) 97.5 F (36.4 C)     Temp Source 04/12/23 1308 Oral     SpO2 --      Weight --      Height --      Head Circumference --      Peak Flow --      Pain Score 04/12/23 1312 0     Pain Loc --      Pain Edu? --      Excl. in GC? --    No data found.  Updated Vital Signs BP 139/79 (BP Location: Left Arm)   Pulse (!) 58   Temp (!) 97.5 F (36.4 C) (Oral)   Resp 16   LMP  (LMP Unknown)   SpO2 90%   Visual Acuity Right Eye Distance:   Left  Eye Distance:   Bilateral Distance:    Right Eye Near:   Left Eye Near:    Bilateral Near:     Physical Exam HENT:     Head: Normocephalic and atraumatic.     Nose: Nose normal.  Eyes:     Extraocular Movements: Extraocular movements intact.     Pupils: Pupils are equal, round, and reactive to  light.  Cardiovascular:     Rate and Rhythm: Regular rhythm. Bradycardia present.  Abdominal:     General: Bowel sounds are normal. There is no distension.     Palpations: Abdomen is soft.     Tenderness: There is no abdominal tenderness. There is no guarding.  Musculoskeletal:        General: Normal range of motion.     Cervical back: Normal range of motion.  Skin:    General: Skin is warm.  Neurological:     General: No focal deficit present.     Mental Status: She is alert.      UC Treatments / Results  Labs (all labs ordered are listed, but only abnormal results are displayed) Labs Reviewed  POCT URINALYSIS DIP (MANUAL ENTRY) - Abnormal; Notable for the following components:      Result Value   pH, UA 8.5 (*)    All other components within normal limits    EKG   Radiology No results found.  Procedures Procedures (including critical care time)  Medications Ordered in UC Medications - No data to display  Initial Impression / Assessment and Plan / UC Course  I have reviewed the triage vital signs and the nursing notes.  Pertinent labs & imaging results that were available during my care of the patient were reviewed by me and considered in my medical decision making (see chart for details).    Diarrhea, unspecified, without associated abdominal pain or other GI symptoms. Suspect this is related to dietary intake. Encouraged patient to avoid taking medication on an empty stomach, hydrate well with water, and you may take lopermide every 6 hours daily as needed for watery stool. Return precautions given if symptoms worsen or do not improve within the next 72 hours to return for evaluation follow-up with primary care doctor/GI provider.  Patient verbalized understanding and agreement with plan. Final Clinical Impressions(s) / UC Diagnoses   Final diagnoses:  Diarrhea, unspecified type     Discharge Instructions      If your symptoms worsen or do not improve  follow-up with your primary care provider or go to the emergency department. Take loperamide as needed for loose stools. Continue with all other medications that you are prescribed.     ED Prescriptions     Medication Sig Dispense Auth. Provider   loperamide (IMODIUM) 2 MG capsule Take 1 capsule (2 mg total) by mouth 4 (four) times daily as needed for diarrhea or loose stools. 20 capsule Bing Neighbors, NP      PDMP not reviewed this encounter.   Bing Neighbors, NP 04/14/23 1212

## 2023-04-23 ENCOUNTER — Other Ambulatory Visit: Payer: Self-pay | Admitting: Internal Medicine

## 2023-04-24 ENCOUNTER — Encounter: Payer: Self-pay | Admitting: Internal Medicine

## 2023-04-24 ENCOUNTER — Ambulatory Visit (INDEPENDENT_AMBULATORY_CARE_PROVIDER_SITE_OTHER): Payer: Medicare HMO | Admitting: Internal Medicine

## 2023-04-24 VITALS — BP 120/62 | HR 80 | Temp 98.5°F | Ht 61.0 in | Wt 174.0 lb

## 2023-04-24 DIAGNOSIS — I82402 Acute embolism and thrombosis of unspecified deep veins of left lower extremity: Secondary | ICD-10-CM

## 2023-04-24 DIAGNOSIS — B370 Candidal stomatitis: Secondary | ICD-10-CM

## 2023-04-24 DIAGNOSIS — R197 Diarrhea, unspecified: Secondary | ICD-10-CM | POA: Diagnosis not present

## 2023-04-24 DIAGNOSIS — I10 Essential (primary) hypertension: Secondary | ICD-10-CM

## 2023-04-24 MED ORDER — NYSTATIN 100000 UNIT/ML MT SUSP: 5.0000 mL | Freq: Four times a day (QID) | OROMUCOSAL | 1 refills | Status: DC

## 2023-04-24 MED ORDER — APIXABAN 2.5 MG PO TABS: 2.5000 mg | ORAL_TABLET | Freq: Two times a day (BID) | ORAL | 3 refills | Status: DC

## 2023-04-24 NOTE — Assessment & Plan Note (Signed)
Resolved, exam benign, in restrospect suspect viral illness now resolved,  to f/u any worsening symptoms or concerns

## 2023-04-24 NOTE — Patient Instructions (Addendum)
Ok to decrease the eliquis to 2.5 mg twice per day  Please take all new medication as prescribed - the nystatin solution  Please continue all other medications as before, and refills have been done if requested.  Please have the pharmacy call with any other refills you may need.  Please keep your appointments with your specialists as you may have planned  Please make an Appointment to return in Dec 10 2023, or sooner if needed

## 2023-04-24 NOTE — Progress Notes (Signed)
Patient ID: Andrea Mason, female   DOB: 1951/03/07, 72 y.o.   MRN: [id number]        Chief Complaint: follow up recent diarrhea, hx of DVT, thrush, htn       HPI:  Andrea Mason is a 72 y.o. female here with c/o recent diarrhea, fortunately now resolved; Denies worsening reflux, abd pain, dysphagia, n/v, bowel change or blood.  Pt denies chest pain, increased sob or doe, wheezing, orthopnea, PND, increased LE swelling, palpitations, dizziness or syncope.   Pt denies polydipsia, polyuria, or new focal neuro s/s.    Pt denies fever, wt loss, night sweats, loss of appetite, or other constitutional symptoms  Taking eliquis 5 bid with hx of DVT.  Has whitish spots to gums.   Daughter with bipolar and shcizophrenia, increased stress recently.         Wt Readings from Last 3 Encounters:  04/24/23 174 lb (78.9 kg)  01/24/23 174 lb 3.2 oz (79 kg)  01/15/23 174 lb (78.9 kg)   BP Readings from Last 3 Encounters:  04/24/23 120/62  04/12/23 139/79  01/24/23 132/84         Past Medical History:  Diagnosis Date   Allergic rhinitis    Allergy    Asthma    Depression    DJD (degenerative joint disease), cervical    DVT (deep venous thrombosis) (HCC)    GERD (gastroesophageal reflux disease)    History of colonic polyps    Hypertension    Obesity    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   Stroke (HCC)    Syncope    Past Surgical History:  Procedure Laterality Date   COLONOSCOPY     FOOT SURGERY  1998   Implantable loop recorder placement  09/16/14   MDT LINQ implanted by Dr Johney Frame for cryptogenic stroke, RIO II protocol   LOOP RECORDER REMOVAL N/A 10/02/2017   Procedure: LOOP RECORDER REMOVAL;  Surgeon: Hillis Range, MD;  Location: [facility];  Service: Cardiovascular;  Laterality: N/A;   NASAL POLYP SURGERY  07/2006   x 2 with Dr. Annalee Genta   NASAL SINUS SURGERY  07/2006   Dr. Annalee Genta   TUBAL LIGATION      reports that she has never smoked. She has been exposed to  tobacco smoke. She has never used smokeless tobacco. She reports current alcohol use. She reports that she does not use drugs. family history includes Allergic rhinitis in her brother, brother, father, mother, and sister; Arrhythmia in her brother; Asthma in her mother and sister; Cancer in her mother; Colon cancer in her brother; Congenital heart disease in her brother; Diabetes in her sister; Heart attack in her brother; Lung disease in her son; Stroke in her brother. Allergies  Allergen Reactions   Iodine Nausea And Vomiting and Rash    Reaction: hot flashes   Ivp Dye [Iodinated Contrast Media] Nausea And Vomiting and Other (See Comments)    hot flashes   Promethazine-Codeine Nausea And Vomiting   Tramadol Other (See Comments)    Almost passed out   Current Outpatient Medications on File Prior to Visit  Medication Sig Dispense Refill   acetaminophen (TYLENOL) 325 MG tablet Per bottle as needed     albuterol (VENTOLIN HFA) 108 (90 Base) MCG/ACT inhaler Inhale 1-2 puffs into the lungs every 6 (six) hours as needed for wheezing or shortness of breath. 1 each 11   alendronate (FOSAMAX) 70 MG tablet Take 1 tablet (70 mg total) by mouth every 7 (  seven) days. Take with a full glass of water on an empty stomach. 12 tablet 3   amLODipine (NORVASC) 5 MG tablet Take by mouth.     aspirin EC 81 MG tablet Take 1 tablet (81 mg total) by mouth daily. Swallow whole. 30 tablet 12   Biotin 16109 MCG TABS Take 10,000 mcg by mouth daily.     budesonide-formoterol (SYMBICORT) 80-4.5 MCG/ACT inhaler Inhale 2 puffs into the lungs 2 (two) times daily. 10.2 g 11   cetirizine (ZYRTEC) 10 MG tablet Take 10 mg by mouth at bedtime.      Cholecalciferol (VITAMIN D3) 2000 units TABS Take 2,000 Units by mouth daily.     Cyanocobalamin (VITAMIN B-12 PO) Take 1,000 mcg by mouth daily.     dextromethorphan-guaiFENesin (MUCINEX DM) 30-600 MG 12hr tablet Take 1-2 tablets by mouth 2 (two) times daily as needed (for  congestion/cough (WITH FLUTTER VALVE)).      fluticasone (FLONASE) 50 MCG/ACT nasal spray Place 2 sprays into both nostrils daily. 16 g 5   loperamide (IMODIUM) 2 MG capsule Take 1 capsule (2 mg total) by mouth 4 (four) times daily as needed for diarrhea or loose stools. 20 capsule 0   Mepolizumab (NUCALA) 100 MG/ML SOAJ Inject into the skin.     montelukast (SINGULAIR) 10 MG tablet Take 1 tablet (10 mg total) by mouth at bedtime. 30 tablet 11   oxymetazoline (AFRIN) 0.05 % nasal spray Place 2 sprays into both nostrils 2 (two) times daily as needed (nasal stuffiness and nasal pressure for 5 days).      pantoprazole (PROTONIX) 40 MG tablet TAKE 1 TABLET(40 MG) BY MOUTH DAILY 90 tablet 1   Respiratory Therapy Supplies (FLUTTER) DEVI Use as directed     sodium chloride (OCEAN) 0.65 % SOLN nasal spray Place 2 sprays into both nostrils as needed (dry nose).      Spacer/Aero-Holding Rudean Curt by Does not apply route.     spironolactone (ALDACTONE) 25 MG tablet TAKE 1 TABLET(25 MG) BY MOUTH DAILY 90 tablet 2   Current Facility-Administered Medications on File Prior to Visit  Medication Dose Route Frequency Provider Last Rate Last Admin   mepolizumab (NUCALA) injection 100 mg  100 mg Subcutaneous Q28 days Alfonse Spruce, MD   100 mg at 04/10/23 1534        ROS:  All others reviewed and negative.  Objective        PE:  BP 120/62 (BP Location: Right Arm, Patient Position: Sitting, Cuff Size: Normal)   Pulse 80   Temp 98.5 F (36.9 C) (Oral)   Ht 5\' 1"  (1.549 m)   Wt 174 lb (78.9 kg)   LMP  (LMP Unknown)   SpO2 99%   BMI 32.88 kg/m                 Constitutional: Pt appears in NAD               HENT: Head: NCAT.                Right Ear: External ear normal.                 Left Ear: External ear normal.                Eyes: . Pupils are equal, round, and reactive to light. Conjunctivae and EOM are normal               Nose: without d/c or  deformity               Neck: Neck  supple. Gross normal ROM               Cardiovascular: Normal rate and regular rhythm.                 Pulmonary/Chest: Effort normal and breath sounds without rales or wheezing.                Abd:  Soft, NT, ND, + BS, no organomegaly               Neurological: Pt is alert. At baseline orientation, motor grossly intact               Skin: Skin is warm. No rashes, no other new lesions, LE edema - none               Psychiatric: Pt behavior is normal without agitation   Micro: none  Cardiac tracings I have personally interpreted today:  none  Pertinent Radiological findings (summarize): none   Lab Results  Component Value Date   WBC 4.2 01/24/2023   HGB 12.9 01/24/2023   HCT 38.3 01/24/2023   PLT 194 01/24/2023   GLUCOSE 85 01/24/2023   CHOL 113 11/29/2022   TRIG 141.0 11/29/2022   HDL 54.10 11/29/2022   LDLCALC 30 11/29/2022   ALT 10 01/24/2023   AST 19 01/24/2023   NA 141 01/24/2023   K 4.3 01/24/2023   CL 103 01/24/2023   CREATININE 0.99 01/24/2023   BUN 13 01/24/2023   CO2 27 01/24/2023   TSH 1.41 11/29/2022   INR 1.0 12/26/2019   HGBA1C 5.8 11/29/2022   Assessment/Plan:  Andrea Mason is a 72 y.o. Black or African American [2] female with  has a past medical history of Allergic rhinitis, Allergy, Asthma, Depression, DJD (degenerative joint disease), cervical, DVT (deep venous thrombosis) (HCC), GERD (gastroesophageal reflux disease), History of colonic polyps, Hypertension, Obesity, Primary hyperaldosteronism (HCC) (11/06/2013), Stroke (HCC), and Syncope.  Essential hypertension BP Readings from Last 3 Encounters:  04/24/23 120/62  04/12/23 139/79  01/24/23 132/84   Stable, pt to continue medical treatment norvasc 5  qd   Leg DVT (deep venous thromboembolism), acute, left (HCC) Now for preventive tx - ok for change to eliquis 2.5 bid  Diarrhea Resolved, exam benign, in restrospect suspect viral illness now resolved,  to f/u any worsening symptoms or  concerns  Thrush Mild to mod, for nystatin soln qid asd,  to f/u any worsening symptoms or concerns  Followup: Return in about 8 months (around 12/10/2023).  Oliver Barre, MD 04/24/2023 8:31 PM [facility]

## 2023-04-24 NOTE — Assessment & Plan Note (Signed)
Mild to mod, for nystatin soln qid asd,  to f/u any worsening symptoms or concerns

## 2023-04-24 NOTE — Assessment & Plan Note (Signed)
Now for preventive tx - ok for change to eliquis 2.5 bid

## 2023-04-24 NOTE — Assessment & Plan Note (Signed)
BP Readings from Last 3 Encounters:  04/24/23 120/62  04/12/23 139/79  01/24/23 132/84   Stable, pt to continue medical treatment norvasc 5  qd

## 2023-05-08 ENCOUNTER — Ambulatory Visit: Payer: Medicare HMO

## 2023-05-12 ENCOUNTER — Telehealth: Payer: Self-pay | Admitting: Student

## 2023-05-12 MED ORDER — FLUCONAZOLE 100 MG PO TABS: 100.0000 mg | ORAL_TABLET | Freq: Every day | ORAL | 0 refills | Status: DC

## 2023-05-12 MED ORDER — PREDNISONE 10 MG PO TABS: ORAL_TABLET | ORAL | 0 refills | Status: DC

## 2023-05-12 NOTE — Telephone Encounter (Signed)
Can we arrange follow up with myself this month or APP?  Thanks!

## 2023-05-12 NOTE — Telephone Encounter (Signed)
Sore throat primarily, cough especially at night over last day or so. No fever. Some sinus congestion. Has just recently been treated for thrush with nystatin but she doesn't think this has helped. Will treat thrush with fluconazole 7-14 day course and possible early asthma exacerbation with prednisone taper. Encouraged her to call back if failing to improve. Will try to get her scheduled for follow up this month.

## 2023-05-14 ENCOUNTER — Telehealth: Payer: Self-pay | Admitting: Student

## 2023-05-14 NOTE — Telephone Encounter (Signed)
PT calling and was ordered to take Fluconazole can she take it with with Musinex DM  Pls call @ [phone]

## 2023-05-14 NOTE — Telephone Encounter (Signed)
Spoke with the pt and notified ok to take mucinex with diflucan  I scheduled her for acute visit for tomorrow since she was requested abx for her cough

## 2023-05-15 ENCOUNTER — Ambulatory Visit: Payer: Medicare HMO | Admitting: Pulmonary Disease

## 2023-05-15 ENCOUNTER — Encounter: Payer: Self-pay | Admitting: Pulmonary Disease

## 2023-05-15 VITALS — BP 134/76 | HR 99 | Temp 98.0°F | Ht 61.0 in | Wt 174.4 lb

## 2023-05-15 DIAGNOSIS — J45901 Unspecified asthma with (acute) exacerbation: Secondary | ICD-10-CM | POA: Diagnosis not present

## 2023-05-15 DIAGNOSIS — J324 Chronic pansinusitis: Secondary | ICD-10-CM

## 2023-05-15 MED ORDER — AMOXICILLIN-POT CLAVULANATE 875-125 MG PO TABS: 1.0000 | ORAL_TABLET | Freq: Two times a day (BID) | ORAL | 0 refills | Status: DC

## 2023-05-15 NOTE — Patient Instructions (Addendum)
Nice to see you   Continue the Prednisone as prescribed  For the nasal congestion continue the flonase  Take Augmentin 1 tab twice daily or 7 days  Return to clinic with Dr. Sherene Sires in 3 months or sooner as needed

## 2023-05-15 NOTE — Progress Notes (Unsigned)
@Patient  ID: Andrea Mason, female    DOB: January 16, 1951, 72 y.o.   MRN: [id number]  Chief Complaint  Patient presents with   Acute Visit    Increased cough over the past wk- prod with white sputum. She states she is blowing out green nasal d/c.     Referring provider: Corwin Levins, MD  HPI:   PMH:  Smoker/ Smoking History:  Maintenance:   Pt of:   05/15/2023  - Visit     Questionaires / Pulmonary Flowsheets:   ACT:  Asthma Control Test ACT Total Score  12/30/2021 12:12 PM 15    MMRC:     No data to display          Epworth:      No data to display          Tests:   FENO:  No results found for: "NITRICOXIDE"  PFT:     No data to display          WALK:     06/13/2018    3:04 PM  SIX MIN WALK  Supplimental Oxygen during Test? (L/min) No  Tech Comments: fast pace/SOB//lmr    Imaging: No results found.  Lab Results:  CBC    Component Value Date/Time   WBC 4.2 01/24/2023 1530   WBC 4.9 09/21/2022 1631   RBC 4.19 01/24/2023 1530   HGB 12.9 01/24/2023 1530   HGB 13.2 04/11/2022 1628   HCT 38.3 01/24/2023 1530   HCT 36.4 04/11/2022 1628   PLT 194 01/24/2023 1530   MCV 91.4 01/24/2023 1530   MCV 89 04/11/2022 1628   MCH 30.8 01/24/2023 1530   MCHC 33.7 01/24/2023 1530   RDW 12.9 01/24/2023 1530   RDW 11.8 04/11/2022 1628   LYMPHSABS 1.6 01/24/2023 1530   LYMPHSABS 2.1 04/11/2022 1628   MONOABS 0.3 01/24/2023 1530   EOSABS 0.1 01/24/2023 1530   EOSABS 0.7 (H) 04/11/2022 1628   BASOSABS 0.0 01/24/2023 1530   BASOSABS 0.1 04/11/2022 1628    BMET    Component Value Date/Time   NA 141 01/24/2023 1530   K 4.3 01/24/2023 1530   CL 103 01/24/2023 1530   CO2 27 01/24/2023 1530   GLUCOSE 85 01/24/2023 1530   BUN 13 01/24/2023 1530   CREATININE 0.99 01/24/2023 1530   CREATININE 1.09 (H) 09/09/2020 0938   CALCIUM 9.8 01/24/2023 1530   GFRNONAA >60 01/24/2023 1530   GFRNONAA 52 (L) 09/09/2020 0938   GFRAA 61 09/09/2020 0938     BNP No results found for: "BNP"  ProBNP No results found for: "PROBNP"  Specialty Problems       Pulmonary Problems   RHINOSINUSITIS, CHRONIC    CT 2012-chronic sinus disease - Referred back to Dr Annalee Genta 01/07/16 > could not see due to $ as has no insurance - 07/11/2016 placed on augmentin x 10 d cylces as part of her action plan and refilled 12/2017 x one year and again 07/26/2021       Mild persistent asthma, uncomplicated    Followed in Pulmonary clinic/ [facility]/ [facility]   -med calendar 03/17/2013 > not using 10/24/2013 , 04/07/2014  > did not bring to clinic 10/20/14  Or 12/11/2014  Redone 04/05/2016  - 12/11/2014 p extensive coaching HFA effectiveness =    75%  - Spirometry 12/11/2014  Min abn mid flows only        Seasonal and perennial allergic rhinitis    Allergy vaccine begun 01/30/11- could not afford.  NASAL POLYP    Qualifier: Diagnosis of  By: Maple Hudson MD, Clinton D       Seasonal allergic rhinitis due to pollen    Qualifier: Diagnosis of  By: Bowne, Chantel        Pansinusitis    See MRI 05/25/14       Cough   Asthma with exacerbation   Vocal cord dysfunction   Cough variant asthma    Followed in pulmonary clinic by [facility]/ Maple Hudson Thora Lance     - Spirometry 12/11/2014  Min abn mid flows only   -  10/15/2020  After extensive coaching inhaler device,  effectiveness =    90%       Upper airway cough syndrome   Acute recurrent pansinusitis   ABPA (allergic bronchopulmonary aspergillosis) (HCC)    Allergies  Allergen Reactions   Iodine Nausea And Vomiting and Rash    Reaction: hot flashes   Ivp Dye [Iodinated Contrast Media] Nausea And Vomiting and Other (See Comments)    hot flashes   Promethazine-Codeine Nausea And Vomiting   Tramadol Other (See Comments)    Almost passed out    Immunization History  Administered Date(s) Administered   Fluad Quad(high Dose 65+) 08/23/2022   Influenza Split 09/03/2012   Influenza Whole 09/27/2006, 08/23/2007,  11/16/2009, 08/25/2010   Influenza,inj,Quad PF,6+ Mos 08/21/2013, 08/06/2014, 10/05/2015   PFIZER(Purple Top)SARS-COV-2 Vaccination 03/04/2020, 03/29/2020   Pneumococcal Conjugate-13 12/02/2014   Pneumococcal Polysaccharide-23 06/03/2019   Tdap 05/06/2013, 08/25/2015   Zoster, Live 05/06/2013    Past Medical History:  Diagnosis Date   Allergic rhinitis    Allergy    Asthma    Depression    DJD (degenerative joint disease), cervical    DVT (deep venous thrombosis) (HCC)    GERD (gastroesophageal reflux disease)    History of colonic polyps    Hypertension    Obesity    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   Stroke (HCC)    Syncope     Tobacco History: Social History   Tobacco Use  Smoking Status Never   Passive exposure: Current (on sister's clothing bc sister is a smoker)  Smokeless Tobacco Never   Counseling given: Not Answered   Continue to not smoke  Outpatient Encounter Medications as of 05/15/2023  Medication Sig   acetaminophen (TYLENOL) 325 MG tablet Per bottle as needed   albuterol (VENTOLIN HFA) 108 (90 Base) MCG/ACT inhaler Inhale 1-2 puffs into the lungs every 6 (six) hours as needed for wheezing or shortness of breath.   alendronate (FOSAMAX) 70 MG tablet Take 1 tablet (70 mg total) by mouth every 7 (seven) days. Take with a full glass of water on an empty stomach.   amLODipine (NORVASC) 5 MG tablet Take by mouth.   apixaban (ELIQUIS) 2.5 MG TABS tablet Take 1 tablet (2.5 mg total) by mouth 2 (two) times daily.   aspirin EC 81 MG tablet Take 1 tablet (81 mg total) by mouth daily. Swallow whole.   Biotin 16109 MCG TABS Take 10,000 mcg by mouth daily.   budesonide-formoterol (SYMBICORT) 80-4.5 MCG/ACT inhaler Inhale 2 puffs into the lungs 2 (two) times daily.   cetirizine (ZYRTEC) 10 MG tablet Take 10 mg by mouth at bedtime.    Cholecalciferol (VITAMIN D3) 2000 units TABS Take 2,000 Units by mouth daily.   Cyanocobalamin (VITAMIN B-12 PO) Take 1,000  mcg by mouth daily.   dextromethorphan-guaiFENesin (MUCINEX DM) 30-600 MG 12hr tablet Take 1-2 tablets by mouth 2 (two) times daily as needed (  for congestion/cough (WITH FLUTTER VALVE)).    fluconazole (DIFLUCAN) 100 MG tablet Take 1 tablet (100 mg total) by mouth daily.   fluticasone (FLONASE) 50 MCG/ACT nasal spray Place 2 sprays into both nostrils daily.   loperamide (IMODIUM) 2 MG capsule Take 1 capsule (2 mg total) by mouth 4 (four) times daily as needed for diarrhea or loose stools.   Mepolizumab (NUCALA) 100 MG/ML SOAJ Inject into the skin.   montelukast (SINGULAIR) 10 MG tablet Take 1 tablet (10 mg total) by mouth at bedtime.   nystatin (MYCOSTATIN) 100000 UNIT/ML suspension Take 5 mLs (500,000 Units total) by mouth 4 (four) times daily.   oxymetazoline (AFRIN) 0.05 % nasal spray Place 2 sprays into both nostrils 2 (two) times daily as needed (nasal stuffiness and nasal pressure for 5 days).    pantoprazole (PROTONIX) 40 MG tablet TAKE 1 TABLET(40 MG) BY MOUTH DAILY   predniSONE (DELTASONE) 10 MG tablet Taper as directed   Respiratory Therapy Supplies (FLUTTER) DEVI Use as directed   sodium chloride (OCEAN) 0.65 % SOLN nasal spray Place 2 sprays into both nostrils as needed (dry nose).    Spacer/Aero-Holding Rudean Curt by Does not apply route.   spironolactone (ALDACTONE) 25 MG tablet TAKE 1 TABLET(25 MG) BY MOUTH DAILY   [DISCONTINUED] predniSONE (DELTASONE) 10 MG tablet Take 4 tabs by mouth for 3 days, then 3 for 3 days, 2 for 3 days, 1 for 3 days and stop   Facility-Administered Encounter Medications as of 05/15/2023  Medication   mepolizumab (NUCALA) injection 100 mg     Review of Systems  Review of Systems   Physical Exam  BP 134/76 (BP Location: Left Arm, Cuff Size: Normal)   Pulse 99   Temp 98 F (36.7 C) (Oral)   Ht 5\' 1"  (1.549 m)   Wt 174 lb 6.4 oz (79.1 kg)   LMP  (LMP Unknown)   SpO2 95% Comment: on RA  BMI 32.95 kg/m   Wt Readings from Last 5 Encounters:   05/15/23 174 lb 6.4 oz (79.1 kg)  04/24/23 174 lb (78.9 kg)  01/24/23 174 lb 3.2 oz (79 kg)  01/15/23 174 lb (78.9 kg)  12/05/22 174 lb (78.9 kg)    BMI Readings from Last 5 Encounters:  05/15/23 32.95 kg/m  04/24/23 32.88 kg/m  01/24/23 32.91 kg/m  01/15/23 32.88 kg/m  12/05/22 32.88 kg/m     Physical Exam    Assessment & Plan:      No follow-ups on file.   Karren Burly, MD 05/15/2023   This appointment required *** minutes of patient care (this includes precharting, chart review, review of results, face-to-face care, etc.).

## 2023-05-31 ENCOUNTER — Ambulatory Visit (INDEPENDENT_AMBULATORY_CARE_PROVIDER_SITE_OTHER): Payer: Medicare HMO

## 2023-05-31 DIAGNOSIS — J4530 Mild persistent asthma, uncomplicated: Secondary | ICD-10-CM

## 2023-05-31 DIAGNOSIS — J4550 Severe persistent asthma, uncomplicated: Secondary | ICD-10-CM | POA: Diagnosis not present

## 2023-06-14 ENCOUNTER — Ambulatory Visit: Payer: Medicare HMO | Admitting: Adult Health

## 2023-06-26 ENCOUNTER — Telehealth: Payer: Self-pay | Admitting: Physician Assistant

## 2023-06-26 ENCOUNTER — Ambulatory Visit (INDEPENDENT_AMBULATORY_CARE_PROVIDER_SITE_OTHER): Payer: Medicare HMO | Admitting: *Deleted

## 2023-06-26 DIAGNOSIS — J4550 Severe persistent asthma, uncomplicated: Secondary | ICD-10-CM

## 2023-06-26 NOTE — Telephone Encounter (Signed)
Patient is aware of rescheduled appointment times/dates 

## 2023-07-16 ENCOUNTER — Emergency Department (HOSPITAL_COMMUNITY)
Admission: EM | Admit: 2023-07-16 | Discharge: 2023-07-17 | Disposition: A | Discharge status: Home / Self Care | Payer: Medicare HMO | Attending: Emergency Medicine | Admitting: Emergency Medicine

## 2023-07-16 ENCOUNTER — Emergency Department (HOSPITAL_COMMUNITY): Payer: Medicare HMO

## 2023-07-16 ENCOUNTER — Other Ambulatory Visit: Payer: Self-pay

## 2023-07-16 ENCOUNTER — Encounter (HOSPITAL_COMMUNITY): Payer: Self-pay

## 2023-07-16 DIAGNOSIS — Z7982 Long term (current) use of aspirin: Secondary | ICD-10-CM | POA: Insufficient documentation

## 2023-07-16 DIAGNOSIS — M79601 Pain in right arm: Secondary | ICD-10-CM | POA: Diagnosis not present

## 2023-07-16 DIAGNOSIS — Z7901 Long term (current) use of anticoagulants: Secondary | ICD-10-CM | POA: Diagnosis not present

## 2023-07-16 DIAGNOSIS — Z1152 Encounter for screening for COVID-19: Secondary | ICD-10-CM | POA: Insufficient documentation

## 2023-07-16 DIAGNOSIS — M25511 Pain in right shoulder: Secondary | ICD-10-CM | POA: Diagnosis present

## 2023-07-16 DIAGNOSIS — J069 Acute upper respiratory infection, unspecified: Secondary | ICD-10-CM | POA: Insufficient documentation

## 2023-07-16 DIAGNOSIS — Z86718 Personal history of other venous thrombosis and embolism: Secondary | ICD-10-CM | POA: Insufficient documentation

## 2023-07-16 DIAGNOSIS — R059 Cough, unspecified: Secondary | ICD-10-CM | POA: Diagnosis not present

## 2023-07-16 LAB — BASIC METABOLIC PANEL
Anion gap: 10 (ref 5–15)
BUN: 12 mg/dL (ref 8–23)
CO2: 24 mmol/L (ref 22–32)
Calcium: 9 mg/dL (ref 8.9–10.3)
Chloride: 103 mmol/L (ref 98–111)
Creatinine, Ser: 1.1 mg/dL — ABNORMAL HIGH (ref 0.44–1.00)
GFR, Estimated: 54 mL/min — ABNORMAL LOW (ref >60)
Glucose, Bld: 131 mg/dL — ABNORMAL HIGH (ref 70–99)
Potassium: 3.9 mmol/L (ref 3.5–5.1)
Sodium: 137 mmol/L (ref 135–145)

## 2023-07-16 LAB — RESP PANEL BY RT-PCR (RSV, FLU A&B, COVID)  RVPGX2
Influenza A by PCR: NEGATIVE
Influenza B by PCR: NEGATIVE
Resp Syncytial Virus by PCR: NEGATIVE
SARS Coronavirus 2 by RT PCR: NEGATIVE

## 2023-07-16 LAB — CBC
HCT: 33.5 % — ABNORMAL LOW (ref 36.0–46.0)
Hemoglobin: 10.8 g/dL — ABNORMAL LOW (ref 12.0–15.0)
MCH: 29.8 pg (ref 26.0–34.0)
MCHC: 32.2 g/dL (ref 30.0–36.0)
MCV: 92.5 fL (ref 80.0–100.0)
Platelets: 212 10*3/uL (ref 150–400)
RBC: 3.62 MIL/uL — ABNORMAL LOW (ref 3.87–5.11)
RDW: 12.8 % (ref 11.5–15.5)
WBC: 4.8 10*3/uL (ref 4.0–10.5)
nRBC: 0 % (ref 0.0–0.2)

## 2023-07-16 LAB — CBG MONITORING, ED: Glucose-Capillary: 125 mg/dL — ABNORMAL HIGH (ref 70–99)

## 2023-07-16 NOTE — ED Triage Notes (Signed)
Patient arrived POV from home with complaint of right arm pain x 2 weeks. Today noticed bruising to bicep is concerned for possible blood clot.  Patient reports PMHX of DVT is currently on Eliquis  Strong pulses present in both arms, equil strength, <3 sec capillary refill.   Reports cough starting today and sneezing. Is concerned for COVID.

## 2023-07-16 NOTE — ED Notes (Signed)
CBG 125 °

## 2023-07-16 NOTE — ED Notes (Signed)
Pt called 2x no answer 

## 2023-07-17 ENCOUNTER — Telehealth: Payer: Self-pay | Admitting: Student

## 2023-07-17 ENCOUNTER — Ambulatory Visit (HOSPITAL_BASED_OUTPATIENT_CLINIC_OR_DEPARTMENT_OTHER)
Admission: RE | Admit: 2023-07-17 | Discharge: 2023-07-17 | Disposition: A | Discharge status: Home / Self Care | Payer: Medicare HMO | Source: Ambulatory Visit | Attending: Emergency Medicine | Admitting: Emergency Medicine

## 2023-07-17 DIAGNOSIS — J45991 Cough variant asthma: Secondary | ICD-10-CM

## 2023-07-17 DIAGNOSIS — Z7901 Long term (current) use of anticoagulants: Secondary | ICD-10-CM | POA: Insufficient documentation

## 2023-07-17 DIAGNOSIS — M25511 Pain in right shoulder: Secondary | ICD-10-CM

## 2023-07-17 DIAGNOSIS — M79629 Pain in unspecified upper arm: Secondary | ICD-10-CM | POA: Insufficient documentation

## 2023-07-17 DIAGNOSIS — Z86718 Personal history of other venous thrombosis and embolism: Secondary | ICD-10-CM | POA: Insufficient documentation

## 2023-07-17 NOTE — Telephone Encounter (Signed)
Patient requesting montelukast to be refilled to [facility] on [address]. Patient states [facility] faxed a request 4 days ago.

## 2023-07-17 NOTE — ED Provider Notes (Signed)
[facility] [facility]PART[facility] AT [facility] Provider Note   CSN: [id number] Arrival date & time: 07/16/23  2201     History  Chief Complaint  Patient presents with   Arm Injury   Cough    Andrea Mason is a 72 y.o. female.  Presents to the emergency department for several problems.  Patient reports that she has noticed pain in her right shoulder and arm for the last 2 weeks.  She reports that she notices increased pain when she is laying in bed at night.  No known injury.  Initially the pain was more in the shoulder area, now she is experiencing some pain that radiates down the bicep.  Worsens with movement.  She thinks it looked swollen earlier.  She does have a history of DVT, is on Eliquis.  Has been taking her medication as prescribed.  Also concerned about a cough which developed today.  She reports that she does have sick contacts and is worried about COVID.       Home Medications Prior to Admission medications   Medication Sig Start Date End Date Taking? Authorizing Provider  acetaminophen (TYLENOL) 325 MG tablet Per bottle as needed 02/17/19   [provider]  albuterol (VENTOLIN HFA) 108 (90 Base) MCG/ACT inhaler Inhale 1-2 puffs into the lungs every 6 (six) hours as needed for wheezing or shortness of breath. 05/23/22   Omar Person, MD  alendronate (FOSAMAX) 70 MG tablet Take 1 tablet (70 mg total) by mouth every 7 (seven) days. Take with a full glass of water on an empty stomach. 12/26/22   Corwin Levins, MD  amLODipine (NORVASC) 5 MG tablet Take by mouth. 03/17/23   [provider]  amoxicillin-clavulanate (AUGMENTIN) 875-125 MG tablet Take 1 tablet by mouth 2 (two) times daily. 05/15/23   Hunsucker, Lesia Sago, MD  apixaban (ELIQUIS) 2.5 MG TABS tablet Take 1 tablet (2.5 mg total) by mouth 2 (two) times daily. 04/24/23   Corwin Levins, MD  aspirin EC 81 MG tablet Take 1 tablet (81 mg total) by mouth daily. Swallow whole. 09/01/22   Corwin Levins, MD  Biotin 82956 MCG TABS Take 10,000 mcg by mouth daily.    [provider]  budesonide-formoterol (SYMBICORT) 80-4.5 MCG/ACT inhaler Inhale 2 puffs into the lungs 2 (two) times daily. 07/05/22   Nyoka Cowden, MD  cetirizine (ZYRTEC) 10 MG tablet Take 10 mg by mouth at bedtime.     [provider]  Cholecalciferol (VITAMIN D3) 2000 units TABS Take 2,000 Units by mouth daily.    [provider]  Cyanocobalamin (VITAMIN B-12 PO) Take 1,000 mcg by mouth daily.    [provider]  dextromethorphan-guaiFENesin (MUCINEX DM) 30-600 MG 12hr tablet Take 1-2 tablets by mouth 2 (two) times daily as needed (for congestion/cough (WITH FLUTTER VALVE)).     [provider]  fluconazole (DIFLUCAN) 100 MG tablet Take 1 tablet (100 mg total) by mouth daily. 05/12/23   Omar Person, MD  fluticasone (FLONASE) 50 MCG/ACT nasal spray Place 2 sprays into both nostrils daily. 06/10/21   Nyoka Cowden, MD  loperamide (IMODIUM) 2 MG capsule Take 1 capsule (2 mg total) by mouth 4 (four) times daily as needed for diarrhea or loose stools. 04/12/23   Bing Neighbors, NP  Mepolizumab (NUCALA) 100 MG/ML SOAJ Inject into the skin.    [provider]  montelukast (SINGULAIR) 10 MG tablet Take 1 tablet (10 mg total) by  mouth at bedtime. 06/13/22   Nyoka Cowden, MD  nystatin (MYCOSTATIN) 100000 UNIT/ML suspension Take 5 mLs (500,000 Units total) by mouth 4 (four) times daily. 04/24/23   Corwin Levins, MD  oxymetazoline (AFRIN) 0.05 % nasal spray Place 2 sprays into both nostrils 2 (two) times daily as needed (nasal stuffiness and nasal pressure for 5 days).     [provider]  pantoprazole (PROTONIX) 40 MG tablet TAKE 1 TABLET(40 MG) BY MOUTH DAILY 01/16/23   Corwin Levins, MD  predniSONE (DELTASONE) 10 MG tablet Taper as directed    [provider]  Respiratory Therapy Supplies (FLUTTER) DEVI Use as directed    [provider]  sodium  chloride (OCEAN) 0.65 % SOLN nasal spray Place 2 sprays into both nostrils as needed (dry nose).     [provider]  Spacer/Aero-Holding Rudean Curt by Does not apply route.    [provider]  spironolactone (ALDACTONE) 25 MG tablet TAKE 1 TABLET(25 MG) BY MOUTH DAILY 04/23/23   Corwin Levins, MD      Allergies    Iodine, Ivp dye [iodinated contrast media], Promethazine-codeine, and Tramadol    Review of Systems   Review of Systems  Physical Exam Updated Vital Signs BP (!) 116/94 (BP Location: Left Arm)   Pulse 80   Temp 98.1 F (36.7 C) (Oral)   Resp 16   Ht 5\' 1"  (1.549 m)   Wt 79.4 kg   LMP  (LMP Unknown)   SpO2 98%   BMI 33.07 kg/m  Physical Exam Vitals and nursing note reviewed.  Constitutional:      General: She is not in acute distress.    Appearance: She is well-developed.  HENT:     Head: Normocephalic and atraumatic.     Mouth/Throat:     Mouth: Mucous membranes are moist.  Eyes:     General: Vision grossly intact. Gaze aligned appropriately.     Extraocular Movements: Extraocular movements intact.     Conjunctiva/sclera: Conjunctivae normal.  Cardiovascular:     Rate and Rhythm: Normal rate and regular rhythm.     Pulses:          Radial pulses are 1+ on the right side.     Heart sounds: Normal heart sounds, S1 normal and S2 normal. No murmur heard.    No friction rub. No gallop.  Pulmonary:     Effort: Pulmonary effort is normal. No respiratory distress.     Breath sounds: Normal breath sounds.  Abdominal:     General: Bowel sounds are normal.     Palpations: Abdomen is soft.     Tenderness: There is no abdominal tenderness. There is no guarding or rebound.     Hernia: No hernia is present.  Musculoskeletal:        General: No swelling.     Right upper arm: Tenderness (Mild proximal bicep tenderness, distal bicep tenderness, no deformity, normal range of motion) present. No swelling or deformity.     Right forearm: Normal.      Cervical back: Full passive range of motion without pain, normal range of motion and neck supple. No spinous process tenderness or muscular tenderness. Normal range of motion.     Right lower leg: No edema.     Left lower leg: No edema.  Skin:    General: Skin is warm and dry.     Capillary Refill: Capillary refill takes less than 2 seconds.     Findings:  No ecchymosis, erythema, rash or wound.  Neurological:     General: No focal deficit present.     Mental Status: She is alert and oriented to person, place, and time.     GCS: GCS eye subscore is 4. GCS verbal subscore is 5. GCS motor subscore is 6.     Cranial Nerves: Cranial nerves 2-12 are intact.     Sensory: Sensation is intact.     Motor: Motor function is intact.     Coordination: Coordination is intact.  Psychiatric:        Attention and Perception: Attention normal.        Mood and Affect: Mood normal.        Speech: Speech normal.        Behavior: Behavior normal.     ED Results / Procedures / Treatments   Labs (all labs ordered are listed, but only abnormal results are displayed) Labs Reviewed  BASIC METABOLIC PANEL - Abnormal; Notable for the following components:      Result Value   Glucose, Bld 131 (*)    Creatinine, Ser 1.10 (*)    GFR, Estimated 54 (*)    All other components within normal limits  CBC - Abnormal; Notable for the following components:   RBC 3.62 (*)    Hemoglobin 10.8 (*)    HCT 33.5 (*)    All other components within normal limits  CBG MONITORING, ED - Abnormal; Notable for the following components:   Glucose-Capillary 125 (*)    All other components within normal limits  RESP PANEL BY RT-PCR (RSV, FLU A&B, COVID)  [id number]  URINALYSIS, ROUTINE W REFLEX MICROSCOPIC    EKG EKG Interpretation Date/Time:  Monday July 16 2023 22:42:51 EDT Ventricular Rate:  72 PR Interval:  172 QRS Duration:  72 QT Interval:  388 QTC Calculation: 424 R Axis:   33  Text Interpretation: Normal sinus  rhythm Normal ECG When compared with ECG of 09-Aug-2020 16:19, No acute changes Confirmed by Gilda Crease [phone]) on 07/17/2023 12:31:39 AM  Radiology DG Chest 2 View  Result Date: 07/16/2023 CLINICAL DATA:  Cough EXAM: CHEST - 2 VIEW COMPARISON:  09/21/2022 FINDINGS: Minimal scarring in the left lung base. No acute airspace disease. Stable cardiomediastinal silhouette. No pneumothorax IMPRESSION: No active cardiopulmonary disease. Electronically Signed   By: Jasmine Pang M.D.   On: 07/16/2023 22:57    Procedures Procedures    Medications Ordered in ED Medications - No data to display  ED Course/ Medical Decision Making/ A&P                                 Medical Decision Making Amount and/or Complexity of Data Reviewed Labs: ordered. Radiology: ordered.   Differential Diagnosis considered includes, but not limited to: DVT; renal failure; nephrotic syndrome; anasarca; cellulitis; ischemic limb  COVID-19; influenza; RSV; simple viral URI; pneumonia  Presents with multiple problems.  Patient complaining of right arm pain for 2 weeks.  No known injury.  She is concerned because of history of DVT.  She is taking her Eliquis as prescribed.  Examination reveals normal skin overlying the extremity.  No signs of cellulitis.  She has normal pulses present.  No concern for ischemic limb.  There is no significant swelling, doubt DVT on Eliquis but will need formal study to rule out.  Examination reveals soft tissue tenderness in the bicep, most likely biceps tendinitis.  Treat with rest and Tylenol if DVT study negative.  Will schedule for tomorrow.  Patient concerned about cough.  COVID test negative.  X-ray negative.  She is breathing comfortably, oxygenation normal.  No specific treatment necessary.        Final Clinical Impression(s) / ED Diagnoses Final diagnoses:  Upper respiratory tract infection, unspecified type  Pain of right upper extremity    Rx / DC  Orders ED Discharge Orders     None         Gilda Crease, MD 07/17/23 0100

## 2023-07-20 MED ORDER — MONTELUKAST SODIUM 10 MG PO TABS
10.0000 mg | ORAL_TABLET | Freq: Every day | ORAL | 11 refills | Status: DC
Start: 2023-07-20 — End: 2024-02-05

## 2023-07-20 NOTE — Telephone Encounter (Signed)
Montelukast refill prescription sent to [facility] on [address] Voicemail left for the patient about the refill.   Nothing further needed

## 2023-07-24 ENCOUNTER — Ambulatory Visit (INDEPENDENT_AMBULATORY_CARE_PROVIDER_SITE_OTHER): Payer: Medicare HMO | Admitting: *Deleted

## 2023-07-24 ENCOUNTER — Telehealth: Payer: Self-pay

## 2023-07-24 ENCOUNTER — Ambulatory Visit: Payer: Medicare HMO | Admitting: Adult Health

## 2023-07-24 ENCOUNTER — Encounter: Payer: Self-pay | Admitting: Adult Health

## 2023-07-24 VITALS — BP 126/74 | HR 83 | Temp 98.0°F | Ht 61.0 in | Wt 173.6 lb

## 2023-07-24 DIAGNOSIS — B4481 Allergic bronchopulmonary aspergillosis: Secondary | ICD-10-CM | POA: Diagnosis not present

## 2023-07-24 DIAGNOSIS — J330 Polyp of nasal cavity: Secondary | ICD-10-CM | POA: Diagnosis not present

## 2023-07-24 DIAGNOSIS — J4550 Severe persistent asthma, uncomplicated: Secondary | ICD-10-CM

## 2023-07-24 DIAGNOSIS — I82402 Acute embolism and thrombosis of unspecified deep veins of left lower extremity: Secondary | ICD-10-CM

## 2023-07-24 MED ORDER — SYMBICORT 80-4.5 MCG/ACT IN AERO: 2.0000 | INHALATION_SPRAY | Freq: Two times a day (BID) | RESPIRATORY_TRACT | 5 refills | Status: DC

## 2023-07-24 NOTE — Patient Instructions (Addendum)
Continue on Symbicort 2 puffs twice daily, rinse after use Continue on Singulair Zyrtec and Flonase daily Albuterol as needed Continue with Nucala and follow-up with allergy team Flutter valve As needed  cough/congestion  Activity as tolerated.  Follow up Hematology next week as planned  Follow up with Dr. Sherene Sires  in 3 months and As needed

## 2023-07-24 NOTE — Assessment & Plan Note (Signed)
History of DVT.  Continue on anticoagulation therapy follow-up with hematology next week as planned

## 2023-07-24 NOTE — Assessment & Plan Note (Addendum)
Continue on Nucala.  Continue follow-up with asthma and allergy.  Recent chest x-ray showed no acute process.

## 2023-07-24 NOTE — Assessment & Plan Note (Signed)
Chronic rhinitis with nasal polyps.  Continue on current regimen with Singulair Zyrtec and Flonase.

## 2023-07-24 NOTE — Assessment & Plan Note (Signed)
Severe persistent asthma with ABPA.  Clinically patient appears to be doing well since starting on Nucala.  Patient is on Symbicort.  If continues to have ongoing flares would consider changing to triple therapy maintenance such as Trelegy. Continue on trigger prevention maintenance regimen with Singulair, Zyrtec and Flonase.  Continue follow-up with asthma and allergy  Plan  Patient Instructions  Continue on Symbicort 2 puffs twice daily, rinse after use Continue on Singulair Zyrtec and Flonase daily Albuterol as needed Continue with Nucala and follow-up with allergy team Flutter valve As needed  cough/congestion  Activity as tolerated.  Follow up Hematology next week as planned  Follow up with Dr. Sherene Sires  in 3 months and As needed

## 2023-07-24 NOTE — Progress Notes (Signed)
@Patient  ID: Andrea Mason, female    DOB: 1951-11-11, 72 y.o.   MRN: [id number]  Chief Complaint  Patient presents with   Follow-up    Referring provider: Corwin Levins, MD  HPI: 72 year old female never smoker followed for eosinophilic asthma with an allergic phenotype, nasal polyps ,Chronic rhinitis,  ABPA  History of DVT on anticoagulation therapy, hx of stroke   TEST/EVENTS :  Eos 1400-1700!, IgE 2000, 2500 > 01/2023 Eos 100. (On Nucala )  Aspergillus fumigatus IgE 1.95 IGE 4098>1191 >1937  HRCT CHest 03/31/22 reviewed by me with saccular bronchiectasis, bronchoceles LLL   07/24/2023 Follow up : Asthma, AR, ABPA, ER follow up  Patient returns for 79-month follow-up.  She has underlying severe persistent asthma, ABPA, chronic rhinitis with nasal polyps.  She remains on Symbicort twice daily.  Patient needs a refill sent to her pharmacy.  Uses Nucala monthly.  Feels that since starting Nucala her symptoms have been much improved.  She currently denies any cough or wheezing.  Last asthma flare was in June, she received antibiotics and steroids.  Patient says symptoms have been much improved.  She did have a cough last week but only lasted about a day. Seen in the emergency room last week upper respiratory infection and arm pain.  Viral panel was negative. Chest x-ray July 16, 2023 showed clear lungs with stable scarring in the left lung base She is followed by asthma and allergy.  She remains on Singulair, Zyrtec and Flonase daily.  Says she has some intermittent nasal congestion but seems to be improved. Does have a history of DVT on Eliquis.  She has an appointment with hematology next week.  Allergies  Allergen Reactions   Iodine Nausea And Vomiting and Rash    Reaction: hot flashes   Ivp Dye [Iodinated Contrast Media] Nausea And Vomiting and Other (See Comments)    hot flashes   Promethazine-Codeine Nausea And Vomiting   Tramadol Other (See Comments)    Almost passed out     Immunization History  Administered Date(s) Administered   Fluad Quad(high Dose 65+) 08/23/2022   Influenza Split 09/03/2012   Influenza Whole 09/27/2006, 08/23/2007, 11/16/2009, 08/25/2010   Influenza,inj,Quad PF,6+ Mos 08/21/2013, 08/06/2014, 10/05/2015   PFIZER(Purple Top)SARS-COV-2 Vaccination 03/04/2020, 03/29/2020   Pneumococcal Conjugate-13 12/02/2014   Pneumococcal Polysaccharide-23 06/03/2019   Tdap 05/06/2013, 08/25/2015   Zoster, Live 05/06/2013    Past Medical History:  Diagnosis Date   Allergic rhinitis    Allergy    Asthma    Depression    DJD (degenerative joint disease), cervical    DVT (deep venous thrombosis) (HCC)    GERD (gastroesophageal reflux disease)    History of colonic polyps    Hypertension    Obesity    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   Stroke (HCC)    Syncope     Tobacco History: Social History   Tobacco Use  Smoking Status Never   Passive exposure: Current (on sister's clothing bc sister is a smoker)  Smokeless Tobacco Never   Counseling given: Not Answered   Outpatient Medications Prior to Visit  Medication Sig Dispense Refill   acetaminophen (TYLENOL) 325 MG tablet Per bottle as needed     albuterol (VENTOLIN HFA) 108 (90 Base) MCG/ACT inhaler Inhale 1-2 puffs into the lungs every 6 (six) hours as needed for wheezing or shortness of breath. 1 each 11   alendronate (FOSAMAX) 70 MG tablet Take 1 tablet (70 mg total) by mouth every 7 (seven)  days. Take with a full glass of water on an empty stomach. 12 tablet 3   amLODipine (NORVASC) 5 MG tablet Take by mouth.     amoxicillin-clavulanate (AUGMENTIN) 875-125 MG tablet Take 1 tablet by mouth 2 (two) times daily. 14 tablet 0   apixaban (ELIQUIS) 2.5 MG TABS tablet Take 1 tablet (2.5 mg total) by mouth 2 (two) times daily. 180 tablet 3   aspirin EC 81 MG tablet Take 1 tablet (81 mg total) by mouth daily. Swallow whole. 30 tablet 12   Biotin 16109 MCG TABS Take 10,000 mcg by  mouth daily.     cetirizine (ZYRTEC) 10 MG tablet Take 10 mg by mouth at bedtime.      Cholecalciferol (VITAMIN D3) 2000 units TABS Take 2,000 Units by mouth daily.     Cyanocobalamin (VITAMIN B-12 PO) Take 1,000 mcg by mouth daily.     dextromethorphan-guaiFENesin (MUCINEX DM) 30-600 MG 12hr tablet Take 1-2 tablets by mouth 2 (two) times daily as needed (for congestion/cough (WITH FLUTTER VALVE)).      fluconazole (DIFLUCAN) 100 MG tablet Take 1 tablet (100 mg total) by mouth daily. 14 tablet 0   fluticasone (FLONASE) 50 MCG/ACT nasal spray Place 2 sprays into both nostrils daily. 16 g 5   loperamide (IMODIUM) 2 MG capsule Take 1 capsule (2 mg total) by mouth 4 (four) times daily as needed for diarrhea or loose stools. 20 capsule 0   Mepolizumab (NUCALA) 100 MG/ML SOAJ Inject into the skin.     montelukast (SINGULAIR) 10 MG tablet Take 1 tablet (10 mg total) by mouth at bedtime. 30 tablet 11   nystatin (MYCOSTATIN) 100000 UNIT/ML suspension Take 5 mLs (500,000 Units total) by mouth 4 (four) times daily. 180 mL 1   oxymetazoline (AFRIN) 0.05 % nasal spray Place 2 sprays into both nostrils 2 (two) times daily as needed (nasal stuffiness and nasal pressure for 5 days).      pantoprazole (PROTONIX) 40 MG tablet TAKE 1 TABLET(40 MG) BY MOUTH DAILY 90 tablet 1   predniSONE (DELTASONE) 10 MG tablet Taper as directed     Respiratory Therapy Supplies (FLUTTER) DEVI Use as directed     sodium chloride (OCEAN) 0.65 % SOLN nasal spray Place 2 sprays into both nostrils as needed (dry nose).      Spacer/Aero-Holding Rudean Curt by Does not apply route.     spironolactone (ALDACTONE) 25 MG tablet TAKE 1 TABLET(25 MG) BY MOUTH DAILY 90 tablet 2   budesonide-formoterol (SYMBICORT) 80-4.5 MCG/ACT inhaler Inhale 2 puffs into the lungs 2 (two) times daily. 10.2 g 11   Facility-Administered Medications Prior to Visit  Medication Dose Route Frequency Provider Last Rate Last Admin   mepolizumab (NUCALA) injection  100 mg  100 mg Subcutaneous Q28 days Alfonse Spruce, MD   100 mg at 06/26/23 1522     Review of Systems:   Constitutional:   No  weight loss, night sweats,  Fevers, chills, fatigue, or  lassitude.  HEENT:   No headaches,  Difficulty swallowing,  Tooth/dental problems, or  Sore throat,                No sneezing, itching, ear ache,  +nasal congestion, post nasal drip,   CV:  No chest pain,  Orthopnea, PND, swelling in lower extremities, anasarca, dizziness, palpitations, syncope.   GI  No heartburn, indigestion, abdominal pain, nausea, vomiting, diarrhea, change in bowel habits, loss of appetite, bloody stools.   Resp: No shortness of breath  with exertion or at rest.  No excess mucus, no productive cough,  No non-productive cough,  No coughing up of blood.  No change in color of mucus.  No wheezing.  No chest wall deformity  Skin: no rash or lesions.  GU: no dysuria, change in color of urine, no urgency or frequency.  No flank pain, no hematuria   MS:  No joint pain or swelling.  No decreased range of motion.  No back pain.    Physical Exam  BP 126/74 (BP Location: Left Arm, Patient Position: Sitting, Cuff Size: Normal)   Pulse 83   Temp 98 F (36.7 C) (Oral)   Ht 5\' 1"  (1.549 m)   Wt 173 lb 9.6 oz (78.7 kg)   LMP  (LMP Unknown)   SpO2 99%   BMI 32.80 kg/m   GEN: A/Ox3; pleasant , NAD, well nourished    HEENT:  [place]/AT, NOSE-clear, THROAT-clear, no lesions, no postnasal drip or exudate noted.   NECK:  Supple w/ fair ROM; no JVD; normal carotid impulses w/o bruits; no thyromegaly or nodules palpated; no lymphadenopathy.    RESP  Clear  P & A; w/o, wheezes/ rales/ or rhonchi. no accessory muscle use, no dullness to percussion  CARD:  RRR, no m/r/g, no peripheral edema, pulses intact, no cyanosis or clubbing.  GI:   Soft & nt; nml bowel sounds; no organomegaly or masses detected.   Musco: Warm bil, no deformities or joint swelling noted.   Neuro: alert, no focal  deficits noted.    Skin: Warm, no lesions or rashes    Lab Results:  CBC    Component Value Date/Time   WBC 4.8 07/16/2023 2245   RBC 3.62 (L) 07/16/2023 2245   HGB 10.8 (L) 07/16/2023 2245   HGB 12.9 01/24/2023 1530   HGB 13.2 04/11/2022 1628   HCT 33.5 (L) 07/16/2023 2245   HCT 36.4 04/11/2022 1628   PLT 212 07/16/2023 2245   PLT 194 01/24/2023 1530   MCV 92.5 07/16/2023 2245   MCV 89 04/11/2022 1628   MCH 29.8 07/16/2023 2245   MCHC 32.2 07/16/2023 2245   RDW 12.8 07/16/2023 2245   RDW 11.8 04/11/2022 1628   LYMPHSABS 1.6 01/24/2023 1530   LYMPHSABS 2.1 04/11/2022 1628   MONOABS 0.3 01/24/2023 1530   EOSABS 0.1 01/24/2023 1530   EOSABS 0.7 (H) 04/11/2022 1628   BASOSABS 0.0 01/24/2023 1530   BASOSABS 0.1 04/11/2022 1628    BMET   BNP No results found for: "BNP"  ProBNP No results found for: "PROBNP"  Imaging:   mepolizumab (NUCALA) injection 100 mg     Date Action Dose Route User   Discharged on 07/17/2023   Admitted on 07/16/2023   06/26/2023 1522 Given 100 mg Subcutaneous (Right Arm) Ma Hillock, CMA   05/31/2023 1402 Given 100 mg Subcutaneous (Left Arm) Robet Leu A, CMA           No data to display          No results found for: "NITRICOXIDE"      Assessment & Plan:   Asthma Severe persistent asthma with ABPA.  Clinically patient appears to be doing well since starting on Nucala.  Patient is on Symbicort.  If continues to have ongoing flares would consider changing to triple therapy maintenance such as Trelegy. Continue on trigger prevention maintenance regimen with Singulair, Zyrtec and Flonase.  Continue follow-up with asthma and allergy  Plan  Patient Instructions  Continue on Symbicort  2 puffs twice daily, rinse after use Continue on Singulair Zyrtec and Flonase daily Albuterol as needed Continue with Nucala and follow-up with allergy team Flutter valve As needed  cough/congestion  Activity as tolerated.   Follow up Hematology next week as planned  Follow up with Dr. Sherene Sires  in 3 months and As needed        ABPA (allergic bronchopulmonary aspergillosis) (HCC) Continue on Nucala.  Continue follow-up with asthma and allergy.  Recent chest x-ray showed no acute process.  NASAL POLYP Chronic rhinitis with nasal polyps.  Continue on current regimen with Singulair Zyrtec and Flonase.  Leg DVT (deep venous thromboembolism), acute, left (HCC) History of DVT.  Continue on anticoagulation therapy follow-up with hematology next week as planned     Rubye Oaks, NP 07/24/2023

## 2023-07-24 NOTE — Telephone Encounter (Signed)
Transition Care Management Unsuccessful Follow-up Telephone Call  Date of discharge and from where:  07/17/2023 [facility]  Attempts:  2nd Attempt  Reason for unsuccessful TCM follow-up call:  Left voice message  Andrea Mason [facility]  [facility] Population Health Community Resource Care Guide   [email or web address]  ?? [phone]   Website: [email or web address]

## 2023-07-24 NOTE — Telephone Encounter (Signed)
Transition Care Management Unsuccessful Follow-up Telephone Call  Date of discharge and from where:  07/17/2023 [facility]  Attempts:  1st Attempt  Reason for unsuccessful TCM follow-up call:  No answer/busy    Danett Palazzo [facility]  [facility] Population Health Community Resource Care Guide   [email or web address]  ?? [phone]   Website: [email or web address]

## 2023-07-27 ENCOUNTER — Other Ambulatory Visit: Payer: Medicare HMO

## 2023-07-27 ENCOUNTER — Ambulatory Visit: Payer: Medicare HMO | Admitting: Physician Assistant

## 2023-07-27 ENCOUNTER — Other Ambulatory Visit: Payer: Self-pay

## 2023-07-27 MED ORDER — PANTOPRAZOLE SODIUM 40 MG PO TBEC: 40.0000 mg | DELAYED_RELEASE_TABLET | Freq: Every day | ORAL | 3 refills | Status: DC

## 2023-07-29 ENCOUNTER — Other Ambulatory Visit: Payer: Self-pay | Admitting: Physician Assistant

## 2023-07-29 DIAGNOSIS — I82402 Acute embolism and thrombosis of unspecified deep veins of left lower extremity: Secondary | ICD-10-CM

## 2023-07-31 ENCOUNTER — Inpatient Hospital Stay: Payer: Medicare HMO | Attending: Physician Assistant

## 2023-07-31 ENCOUNTER — Inpatient Hospital Stay: Payer: Medicare HMO | Admitting: Physician Assistant

## 2023-07-31 ENCOUNTER — Other Ambulatory Visit: Payer: Self-pay

## 2023-07-31 VITALS — BP 131/88 | HR 78 | Temp 97.9°F | Resp 18 | Wt 170.9 lb

## 2023-07-31 DIAGNOSIS — I82432 Acute embolism and thrombosis of left popliteal vein: Secondary | ICD-10-CM | POA: Diagnosis not present

## 2023-07-31 DIAGNOSIS — I82412 Acute embolism and thrombosis of left femoral vein: Secondary | ICD-10-CM | POA: Insufficient documentation

## 2023-07-31 DIAGNOSIS — Z7901 Long term (current) use of anticoagulants: Secondary | ICD-10-CM | POA: Insufficient documentation

## 2023-07-31 DIAGNOSIS — I82452 Acute embolism and thrombosis of left peroneal vein: Secondary | ICD-10-CM | POA: Diagnosis not present

## 2023-07-31 DIAGNOSIS — I82442 Acute embolism and thrombosis of left tibial vein: Secondary | ICD-10-CM | POA: Diagnosis not present

## 2023-07-31 DIAGNOSIS — I82402 Acute embolism and thrombosis of unspecified deep veins of left lower extremity: Secondary | ICD-10-CM

## 2023-07-31 DIAGNOSIS — Z800 Family history of malignant neoplasm of digestive organs: Secondary | ICD-10-CM | POA: Insufficient documentation

## 2023-07-31 LAB — CBC WITH DIFFERENTIAL (CANCER CENTER ONLY)
Abs Immature Granulocytes: 0.01 10*3/uL (ref 0.00–0.07)
Basophils Absolute: 0 10*3/uL (ref 0.0–0.1)
Basophils Relative: 1 %
Eosinophils Absolute: 0.1 10*3/uL (ref 0.0–0.5)
Eosinophils Relative: 1 %
HCT: 34.7 % — ABNORMAL LOW (ref 36.0–46.0)
Hemoglobin: 11.3 g/dL — ABNORMAL LOW (ref 12.0–15.0)
Immature Granulocytes: 0 %
Lymphocytes Relative: 36 %
Lymphs Abs: 1.4 10*3/uL (ref 0.7–4.0)
MCH: 30.1 pg (ref 26.0–34.0)
MCHC: 32.6 g/dL (ref 30.0–36.0)
MCV: 92.3 fL (ref 80.0–100.0)
Monocytes Absolute: 0.3 10*3/uL (ref 0.1–1.0)
Monocytes Relative: 8 %
Neutro Abs: 2.1 10*3/uL (ref 1.7–7.7)
Neutrophils Relative %: 54 %
Platelet Count: 208 10*3/uL (ref 150–400)
RBC: 3.76 MIL/uL — ABNORMAL LOW (ref 3.87–5.11)
RDW: 13 % (ref 11.5–15.5)
WBC Count: 3.9 10*3/uL — ABNORMAL LOW (ref 4.0–10.5)
nRBC: 0 % (ref 0.0–0.2)

## 2023-07-31 LAB — CMP (CANCER CENTER ONLY)
ALT: 8 U/L (ref 0–44)
AST: 14 U/L — ABNORMAL LOW (ref 15–41)
Albumin: 3.9 g/dL (ref 3.5–5.0)
Alkaline Phosphatase: 91 U/L (ref 38–126)
Anion gap: 5 (ref 5–15)
BUN: 11 mg/dL (ref 8–23)
CO2: 29 mmol/L (ref 22–32)
Calcium: 9.3 mg/dL (ref 8.9–10.3)
Chloride: 105 mmol/L (ref 98–111)
Creatinine: 0.97 mg/dL (ref 0.44–1.00)
GFR, Estimated: >60 mL/min (ref >60)
Glucose, Bld: 111 mg/dL — ABNORMAL HIGH (ref 70–99)
Potassium: 4.3 mmol/L (ref 3.5–5.1)
Sodium: 139 mmol/L (ref 135–145)
Total Bilirubin: 1 mg/dL (ref 0.3–1.2)
Total Protein: 7.1 g/dL (ref 6.5–8.1)

## 2023-07-31 NOTE — Progress Notes (Signed)
[facility] [phone]   [phone]  PROGRESS NOTE  Patient Care Team: Corwin Levins, MD as PCP - General Micki Riley, MD as Consulting Physician (Neurology) Szabat, Vinnie Level, [facility] (Inactive) as Pharmacist (Pharmacist)  Hematological/Oncological History 08/24/2022:Presented with left lower extremity swelling x 2-3 months. Doppler US showed acute and occlusive deep vein thrombosis involving the common femoral vein, femoral vein, popliteal vein, posterior  tibial veins, and peroneal veins.  The external iliac vein is consistent with age indeterminate and partially recanalized thrombus. Started on Eliquis therapy 09/21/2022: Doppler US: Findings consistent with age indeterminate deep vein thrombosis  involving the left common femoral vein, SF junction, left femoral vein,  left proximal profunda vein, left popliteal vein, and left peroneal veins.  12/23/2022:Doppler US:Findings consistent with chronic deep vein thrombosis involving the left  femoral vein, and left popliteal vein. When compared to prior studies,  thrombus in the common femoral, saphenofemoral junction, posterior tibial  veins, and peroneal veins appears to have resolved.  01/24/2023: Establish care with [facility] 04/24/2023: Transitioned to Eliquis 2.5 mg PO twice daily  CHIEF COMPLAINTS/PURPOSE OF CONSULTATION:  "Left lower extremity DVT"  HISTORY OF PRESENTING ILLNESS:  Andrea Mason 72 y.o. female returns for history of left lower extremity DVT. She is unaccompanied for this visit.   On exam today, Andrea Mason reports that she is tolerating Eliquis therapy without any side effects.She reports no lower extremity swelling. She denies easy bruising or signs of bleeding. She is otherwise feeling well. She denies fevers, chills, sweats, shortness of breath, chest pain, cough, nausea, vomiting or diarrhea. Rest of the 10 point ROS is below.   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 Annalee Genta   NASAL SINUS SURGERY  07/2006   Dr. Annalee Genta   TUBAL LIGATION      SOCIAL HISTORY: Social History   Socioeconomic History   Marital status: Widowed    Spouse name: Not on file   Number of children: 3   Years of education: Not on file   Highest education level: Not on file  Occupational History   Occupation: Museum/gallery curator    Comment: [employer]  Tobacco Use   Smoking status: Never    Passive exposure: Current (on sister's clothing bc sister is a smoker)   Smokeless tobacco: Never  Vaping Use   Vaping status: Never Used  Substance and Sexual Activity   Alcohol use: Yes    Comment: socially wine   Drug use: No   Sexual activity: Not Currently    Birth control/protection: Post-menopausal  Other Topics Concern   Not on file  Social History Narrative   Widowed, husband died in 08/25/06   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   Patient lives alone.   Social Determinants of Health   Financial Resource Strain: Not on file  Food Insecurity: No Food Insecurity (01/24/2023)   Hunger Vital Sign    Worried About Running Out  of Food in the Last Year: Never true    Ran Out of Food in the Last Year: Never true  Transportation Needs: No Transportation Needs (01/24/2023)   PRAPARE - Administrator, Civil Service (Medical): No    Lack of Transportation (Non-Medical): No  Physical Activity: Not on file  Stress: Not on file  Social Connections: Not on file  Intimate Partner Violence: Not on file    FAMILY HISTORY: Family History  Problem Relation Age of Onset   Asthma Mother    Allergic rhinitis Mother    Cancer Mother    Allergic rhinitis Father    Asthma Sister    Allergic rhinitis Sister    Diabetes Sister    Allergic rhinitis Brother    Colon cancer Brother    Stroke Brother    Arrhythmia Brother    Allergic rhinitis Brother    Congenital heart disease Brother    Heart attack Brother    Lung disease Son        Sarcoid vs ARDS related fibrosis, required 2 double lung transplants   Esophageal cancer Neg Hx    Rectal cancer Neg Hx    Stomach cancer Neg Hx     ALLERGIES:  is allergic to iodine, ivp dye [iodinated contrast media], promethazine-codeine, and tramadol.  MEDICATIONS:  Current Outpatient Medications  Medication Sig Dispense Refill   acetaminophen (TYLENOL) 325 MG tablet Per bottle as needed     albuterol (VENTOLIN HFA) 108 (90 Base) MCG/ACT inhaler Inhale 1-2 puffs into the lungs every 6 (six) hours as needed for wheezing or shortness of breath. 1 each 11   alendronate (FOSAMAX) 70 MG tablet Take 1 tablet (70 mg total) by mouth every 7 (seven) days. Take with a full glass of water on an empty stomach. 12 tablet 3   apixaban (ELIQUIS) 2.5 MG TABS tablet Take 1 tablet (2.5 mg total) by mouth 2 (two) times daily. 180 tablet 3   aspirin EC 81 MG tablet Take 1 tablet (81 mg total) by mouth daily. Swallow whole. 30 tablet 12   Biotin 40981 MCG TABS Take 10,000 mcg by mouth daily.     budesonide-formoterol (SYMBICORT) 80-4.5 MCG/ACT inhaler Inhale 2 puffs into the lungs 2 (two) times  daily. 1 each 5   cetirizine (ZYRTEC) 10 MG tablet Take 10 mg by mouth at bedtime.      Cholecalciferol (VITAMIN D3) 2000 units TABS Take 2,000 Units by mouth daily.     Cyanocobalamin (VITAMIN B-12 PO) Take 1,000 mcg by mouth daily.     fluticasone (FLONASE) 50 MCG/ACT nasal spray Place 2 sprays into both nostrils daily. 16 g 5   Mepolizumab (NUCALA) 100 MG/ML SOAJ Inject into the skin.     montelukast (SINGULAIR) 10 MG tablet Take 1 tablet (10 mg total) by mouth at bedtime. 30 tablet 11   nystatin (MYCOSTATIN) 100000 UNIT/ML suspension Take 5 mLs (500,000 Units total) by mouth 4 (four) times daily. 180 mL 1   oxymetazoline (AFRIN) 0.05 % nasal spray  Place 2 sprays into both nostrils 2 (two) times daily as needed (nasal stuffiness and nasal pressure for 5 days).      pantoprazole (PROTONIX) 40 MG tablet Take 1 tablet (40 mg total) by mouth daily. 90 tablet 3   predniSONE (DELTASONE) 10 MG tablet Taper as directed     Respiratory Therapy Supplies (FLUTTER) DEVI Use as directed     sodium chloride (OCEAN) 0.65 % SOLN nasal spray Place 2 sprays into both nostrils as needed (dry nose).      Spacer/Aero-Holding Rudean Curt by Does not apply route.     spironolactone (ALDACTONE) 25 MG tablet TAKE 1 TABLET(25 MG) BY MOUTH DAILY 90 tablet 2   amLODipine (NORVASC) 5 MG tablet Take by mouth. (Patient not taking: Reported on 07/31/2023)     amoxicillin-clavulanate (AUGMENTIN) 875-125 MG tablet Take 1 tablet by mouth 2 (two) times daily. (Patient not taking: Reported on 07/31/2023) 14 tablet 0   dextromethorphan-guaiFENesin (MUCINEX DM) 30-600 MG 12hr tablet Take 1-2 tablets by mouth 2 (two) times daily as needed (for congestion/cough (WITH FLUTTER VALVE)).  (Patient not taking: Reported on 07/31/2023)     fluconazole (DIFLUCAN) 100 MG tablet Take 1 tablet (100 mg total) by mouth daily. (Patient not taking: Reported on 07/31/2023) 14 tablet 0   loperamide (IMODIUM) 2 MG capsule Take 1 capsule (2 mg total) by  mouth 4 (four) times daily as needed for diarrhea or loose stools. (Patient not taking: Reported on 07/31/2023) 20 capsule 0   Current Facility-Administered Medications  Medication Dose Route Frequency Provider Last Rate Last Admin   mepolizumab (NUCALA) injection 100 mg  100 mg Subcutaneous Q28 days Alfonse Spruce, MD   100 mg at 07/24/23 1550    REVIEW OF SYSTEMS:   Constitutional: ( - ) fevers, ( - )  chills , ( - ) night sweats Eyes: ( - ) blurriness of vision, ( - ) double vision, ( - ) watery eyes Ears, nose, mouth, throat, and face: ( - ) mucositis, ( - ) sore throat Respiratory: ( - ) cough, ( - ) dyspnea, ( - ) wheezes Cardiovascular: ( - ) palpitation, ( - ) chest discomfort, ( - ) lower extremity swelling Gastrointestinal:  ( - ) nausea, ( - ) heartburn, ( - ) change in bowel habits Skin: ( - ) abnormal skin rashes Lymphatics: ( - ) new lymphadenopathy, ( - ) easy bruising Neurological: ( - ) numbness, ( - ) tingling, ( - ) new weaknesses Behavioral/Psych: ( - ) mood change, ( - ) new changes  All other systems were reviewed with the patient and are negative.  PHYSICAL EXAMINATION: ECOG PERFORMANCE STATUS: 0 - Asymptomatic  Vitals:   07/31/23 1322  BP: 131/88  Pulse: 78  Resp: 18  Temp: 97.9 F (36.6 C)  SpO2: 100%   Filed Weights   07/31/23 1322  Weight: 170 lb 14.4 oz (77.5 kg)    GENERAL: well appearing female in NAD  SKIN: skin color, texture, turgor are normal, no rashes or significant lesions EYES: conjunctiva are pink and non-injected, sclera clear LUNGS: clear to auscultation and percussion with normal breathing effort HEART: regular rate & rhythm and no murmurs and no lower extremity edema Musculoskeletal: no cyanosis of digits and no clubbing  PSYCH: alert & oriented x 3, fluent speech NEURO: no focal motor/sensory deficits  LABORATORY DATA:  I have reviewed the data as listed    Latest Ref Rng & Units 07/31/2023   12:41 PM  07/16/2023    10:45 PM 01/24/2023    3:30 PM  CBC  WBC 4.0 - 10.5 K/uL 3.9  4.8  4.2   Hemoglobin 12.0 - 15.0 g/dL 47.8  29.5  62.1   Hematocrit 36.0 - 46.0 % 34.7  33.5  38.3   Platelets 150 - 400 K/uL 208  212  194        Latest Ref Rng & Units 07/31/2023   12:41 PM 07/16/2023   10:45 PM 01/24/2023    3:30 PM  CMP  Glucose 70 - 99 mg/dL 308  657  85   BUN 8 - 23 mg/dL 11  12  13    Creatinine 0.44 - 1.00 mg/dL 8.46  9.62  9.52   Sodium 135 - 145 mmol/L 139  137  141   Potassium 3.5 - 5.1 mmol/L 4.3  3.9  4.3   Chloride 98 - 111 mmol/L 105  103  103   CO2 22 - 32 mmol/L 29  24  27    Calcium 8.9 - 10.3 mg/dL 9.3  9.0  9.8   Total Protein 6.5 - 8.1 g/dL 7.1   6.8   Total Bilirubin 0.3 - 1.2 mg/dL 1.0   0.7   Alkaline Phos 38 - 126 U/L 91   86   AST 15 - 41 U/L 14   19   ALT 0 - 44 U/L 8   10     RADIOGRAPHIC STUDIES: I have personally reviewed the radiological images as listed and agreed with the findings in the report. UE VENOUS DUPLEX  Result Date: 07/17/2023 UPPER VENOUS STUDY  Patient Name:  Andrea Mason  Date of Exam:   07/17/2023 Medical Rec #: [id number]       Accession #:    [id number] Date of Birth: Dec 25, 1950        Patient Gender: F Patient Age:   38 years Exam Location:  [facility] Procedure:      VAS [place] UPPER EXTREMITY VENOUS DUPLEX Referring Phys: Cristal Deer POLLINA --------------------------------------------------------------------------------  Indications: Right shoulder/upper arm pain, history of LE DVT on anticoagulation Anticoagulation: Eliquis. Comparison Study: No prior upper extremity studies. Performing Technologist: Jean Rosenthal RDMS, RVT  Examination Guidelines: A complete evaluation includes B-mode imaging, spectral Doppler, color Doppler, and power Doppler as needed of all accessible portions of each vessel. Bilateral testing is considered an integral part of a complete examination. Limited examinations for reoccurring indications may be performed as noted.  Right  Findings: +----------+------------+---------+-----------+----------+-------+ RIGHT     CompressiblePhasicitySpontaneousPropertiesSummary +----------+------------+---------+-----------+----------+-------+ IJV           Full       Yes       Yes                      +----------+------------+---------+-----------+----------+-------+ Subclavian               Yes       Yes                      +----------+------------+---------+-----------+----------+-------+ Axillary      Full       Yes       Yes                      +----------+------------+---------+-----------+----------+-------+ Brachial      Full       Yes       Yes                      +----------+------------+---------+-----------+----------+-------+  Radial        Full                                          +----------+------------+---------+-----------+----------+-------+ Ulnar         Full                                          +----------+------------+---------+-----------+----------+-------+ Cephalic      Full                                          +----------+------------+---------+-----------+----------+-------+ Basilic       Full                                          +----------+------------+---------+-----------+----------+-------+  Left Findings: +----------+------------+---------+-----------+----------+-------+ LEFT      CompressiblePhasicitySpontaneousPropertiesSummary +----------+------------+---------+-----------+----------+-------+ Subclavian               Yes       Yes                      +----------+------------+---------+-----------+----------+-------+  Summary:  Right: No evidence of deep vein thrombosis in the upper extremity. No evidence of superficial vein thrombosis in the upper extremity.  Left: No evidence of thrombosis in the subclavian.  *See table(s) above for measurements and observations.  Diagnosing physician: Coral Else MD Electronically signed  by Coral Else MD on 07/17/2023 at 9:04:17 PM.    Final    DG Chest 2 View  Result Date: 07/16/2023 CLINICAL DATA:  Cough EXAM: CHEST - 2 VIEW COMPARISON:  09/21/2022 FINDINGS: Minimal scarring in the left lung base. No acute airspace disease. Stable cardiomediastinal silhouette. No pneumothorax IMPRESSION: No active cardiopulmonary disease. Electronically Signed   By: Jasmine Pang M.D.   On: 07/16/2023 22:57    ASSESSMENT & PLAN Aleea Keef is a 72 y.o. female who presents for a follow up for history of left lower extremity DVT diagnosed in September 2023.   #Unprovoked LLE DVT --findings at this time are consistent with a unprovoked VTE --Workup from 01/24/2023 showed no evidence of antiphospholipid syndrome.  --Labs today reviewed and require no intervention.  --Currently on Eliquis 2.5 mg twice daily. Recommend to continue. --patient denies any bleeding, bruising, or dark stools on this medication. It is well tolerated. No difficulties accessing/affording the medication --RTC in 6 months' time with strict return precautions for overt signs of bleeding.    No orders of the defined types were placed in this encounter.   All questions were answered. The patient knows to call the clinic with any problems, questions or concerns.  I have spent a total of 25 minutes minutes of face-to-face and non-face-to-face time, preparing to see the patient,  performing a medically appropriate examination, counseling and educating the patient,documenting clinical information in the electronic health record,  and care coordination.   Georga Kaufmann, PA-C Department of Hematology/Oncology [facility] at [facility] Phone: [phone]

## 2023-08-06 ENCOUNTER — Emergency Department (HOSPITAL_COMMUNITY)
Admission: EM | Admit: 2023-08-06 | Discharge: 2023-08-06 | Disposition: A | Discharge status: Home / Self Care | Payer: Medicare HMO | Attending: Emergency Medicine | Admitting: Emergency Medicine

## 2023-08-06 ENCOUNTER — Other Ambulatory Visit: Payer: Self-pay

## 2023-08-06 ENCOUNTER — Encounter (HOSPITAL_COMMUNITY): Payer: Self-pay

## 2023-08-06 DIAGNOSIS — Z7982 Long term (current) use of aspirin: Secondary | ICD-10-CM | POA: Insufficient documentation

## 2023-08-06 DIAGNOSIS — Z8673 Personal history of transient ischemic attack (TIA), and cerebral infarction without residual deficits: Secondary | ICD-10-CM | POA: Diagnosis not present

## 2023-08-06 DIAGNOSIS — S90561A Insect bite (nonvenomous), right ankle, initial encounter: Secondary | ICD-10-CM | POA: Diagnosis not present

## 2023-08-06 DIAGNOSIS — W57XXXA Bitten or stung by nonvenomous insect and other nonvenomous arthropods, initial encounter: Secondary | ICD-10-CM | POA: Diagnosis not present

## 2023-08-06 DIAGNOSIS — Z7901 Long term (current) use of anticoagulants: Secondary | ICD-10-CM | POA: Diagnosis not present

## 2023-08-06 MED ORDER — HYDROCORTISONE 1 % EX CREA: TOPICAL_CREAM | CUTANEOUS | 0 refills | Status: DC

## 2023-08-06 NOTE — Discharge Instructions (Addendum)
You were seen here today for evaluation of insect bites to your right ankle.  I likely think you are having allergic reaction to some ant bites potentially.  For this, I recommend applying a hydrocortisone cream to the area.  I have sent a prescription for one, but you can also pick this up over the counter. You can also continue taking a daily Zyrtec or Claritin to help with your itching.  Please make sure the area stays clean.  Please follow up with your PCP/ Allergist. I do not recommend driving if you have having sensory differences in your right foot. If the redness starts to spread, itching becomes worse or you develop any fever or purulent discharge, please return to your nearest emergency department for reevaluation.  If you have any concerns, new or worsening symptoms, please return to your nearest emergency department for evaluation.  Contact a doctor if: You have redness, swelling, or pain in the bite area. You have fluid or blood coming from the bite area. The bite area feels warm to the touch. You have pus or a bad smell coming from the bite area. You have a fever. Get help right away if: You have joint pain. You have a rash. You feel weak or more tired than you normally do. You have neck pain or a headache. You have signs of an anaphylactic reaction. Signs may include: Swelling of your eyes, lips, face, mouth, tongue, or throat. Feeling warm in the face. Itchy, red, swollen areas of skin. Trouble with breathing, talking, or swallowing. Wheezing. Feeling dizzy or light-headed. Fainting. Pain or cramps in your belly. Vomiting or watery poop. These symptoms may be an emergency. Get help right away. Call 911. Do not wait to see if symptoms will go away. Do not drive yourself to the hospital.

## 2023-08-06 NOTE — ED Triage Notes (Signed)
Pt arrives with c/o multiple insect bites to right ankle. Pt ankle is swollen, red, and has small white bumps. Pt denies CP, SOB, or difficulty swallowing.

## 2023-08-07 ENCOUNTER — Other Ambulatory Visit: Payer: Self-pay

## 2023-08-07 ENCOUNTER — Ambulatory Visit (INDEPENDENT_AMBULATORY_CARE_PROVIDER_SITE_OTHER): Payer: Medicare HMO | Admitting: Allergy & Immunology

## 2023-08-07 ENCOUNTER — Encounter: Payer: Self-pay | Admitting: Allergy & Immunology

## 2023-08-07 VITALS — BP 110/70 | HR 72 | Temp 98.4°F | Ht 61.0 in | Wt 172.8 lb

## 2023-08-07 DIAGNOSIS — B4481 Allergic bronchopulmonary aspergillosis: Secondary | ICD-10-CM | POA: Diagnosis not present

## 2023-08-07 DIAGNOSIS — J302 Other seasonal allergic rhinitis: Secondary | ICD-10-CM

## 2023-08-07 DIAGNOSIS — J4550 Severe persistent asthma, uncomplicated: Secondary | ICD-10-CM

## 2023-08-07 DIAGNOSIS — T63421D Toxic effect of venom of ants, accidental (unintentional), subsequent encounter: Secondary | ICD-10-CM

## 2023-08-07 DIAGNOSIS — B999 Unspecified infectious disease: Secondary | ICD-10-CM | POA: Diagnosis not present

## 2023-08-07 DIAGNOSIS — J3089 Other allergic rhinitis: Secondary | ICD-10-CM | POA: Diagnosis not present

## 2023-08-07 MED ORDER — CLOBETASOL PROPIONATE 0.05 % EX OINT: 1.0000 | TOPICAL_OINTMENT | Freq: Two times a day (BID) | CUTANEOUS | 0 refills | Status: DC

## 2023-08-07 MED ORDER — PREDNISONE 10 MG PO TABS: ORAL_TABLET | ORAL | 0 refills | Status: DC

## 2023-08-07 NOTE — Patient Instructions (Addendum)
1. 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 - I am going to talk to Dr. Sherene Sires about your posiconazole.  - Daily controller medication(s): Symbicort 2 puffs twice daily + Nucala monthly - Prior to physical activity: albuterol 2 puffs 10-15 minutes before physical activity. - Rescue medications: albuterol 4 puffs every 4-6 hours as needed - Asthma control goals:  * Full participation in all desired activities (may need albuterol before activity) * Albuterol use two time or less a week on average (not counting use with activity) * Cough interfering with sleep two time or less a month * Oral steroids no more than once a year * No hospitalizations  2. Chronic rhinitis - with nasal polyps - Previous testing showed: grasses, ragweed, and trees - Continue with: Zyrtec (cetirizine) 10mg  tablet once daily - Continue with: Flonase (fluticasone) one spray per nostril daily (AIM FOR EAR ON EACH SIDE) - You can use an extra dose of the antihistamine, if needed, for breakthrough symptoms.  - Consider nasal saline rinses 1-2 times daily to remove allergens from the nasal cavities as well as help with mucous clearance (this is especially helpful to do before the nasal sprays are given)  3. Recurrent infections - We are going to hold off on further workup now.  - Previous workup was largely normal.   4. Rash - likely fire ant bites  - Add on the prednisone taper that I sent in: Take two tablets (20mg ) twice daily for three days, then one tablet (10mg ) twice daily for three days, then STOP. - Add on clobetasol ointment twice daily for 1-2 weeks and taper as tolerated (DO NOT ever use on the face).  - We can get a fire ant blood work to see if you are allergic.   5. Return in about 6 months (around 02/04/2024).    Please inform us of any Emergency Department visits, hospitalizations, or changes in symptoms. Call us before going to the ED for breathing or  allergy symptoms since we might be able to fit you in for a sick visit. Feel free to contact us anytime with any questions, problems, or concerns.  It was a pleasure to see you today!   Websites that have reliable patient information: 1. American Academy of Asthma, Allergy, and Immunology: www.aaaai.org 2. Food Allergy Research and Education (FARE): foodallergy.org 3. Mothers of Asthmatics: http://www.asthmacommunitynetwork.org 4. American College of Allergy, Asthma, and Immunology: www.acaai.org   COVID-19 Vaccine Information can be found at: PodExchange.[email or web address] For questions related to vaccine distribution or appointments, please email [email or web address] or call [phone].   We realize that you might be concerned about having an allergic reaction to the COVID19 vaccines. To help with that concern, WE ARE OFFERING THE COVID19 VACCINES IN OUR OFFICE! Ask the front desk for dates!     "Like" [place] on Facebook and Instagram for our latest updates!      A healthy democracy works best when [employer] participate! Make sure you are registered to vote! If you have moved or changed any of your contact information, you will need to get this updated before voting!  In some cases, you MAY be able to register to vote online: [email or web address]

## 2023-08-07 NOTE — ED Provider Notes (Signed)
[place] EMERGENCY DEPARTMENT AT [facility] Provider Note   CSN: [id number] Arrival date & time: 08/06/23  1954     History Chief Complaint  Patient presents with   Insect Bite    Andrea Mason is a 72 y.o. female with h/o seasonal allergies, and stroke with right sided sensory deficits presents to the ER for evaluation of ankle rash. Today around midnight, she reports that she was outside with her grandson standing, when she started to feel a "weird" sensation across her ankle and it started to itch. She reports that she has some sensory issues from her stroke. She denies any pain from the area, but reports that it is itching. She denies any SOB or chest pain. She denies any trouble swallowing. She denies that the rash is anywhere else. The patient reports that she thinks it may have been fire ants, as she reports she had a reaction to this a few years prior.  HPI     Home Medications Prior to Admission medications   Medication Sig Start Date End Date Taking? Authorizing Provider  hydrocortisone cream 1 % Apply to affected area 2 times daily 08/06/23  Yes Achille Rich, PA-C  acetaminophen (TYLENOL) 325 MG tablet Per bottle as needed 02/17/19   [provider]  albuterol (VENTOLIN HFA) 108 (90 Base) MCG/ACT inhaler Inhale 1-2 puffs into the lungs every 6 (six) hours as needed for wheezing or shortness of breath. 05/23/22   Omar Person, MD  alendronate (FOSAMAX) 70 MG tablet Take 1 tablet (70 mg total) by mouth every 7 (seven) days. Take with a full glass of water on an empty stomach. 12/26/22   Corwin Levins, MD  amoxicillin-clavulanate (AUGMENTIN) 875-125 MG tablet Take 1 tablet by mouth 2 (two) times daily. Patient not taking: Reported on 07/31/2023 05/15/23   Hunsucker, Lesia Sago, MD  apixaban (ELIQUIS) 2.5 MG TABS tablet Take 1 tablet (2.5 mg total) by mouth 2 (two) times daily. 04/24/23   Corwin Levins, MD  aspirin EC 81 MG tablet Take 1 tablet (81 mg total)  by mouth daily. Swallow whole. 09/01/22   Corwin Levins, MD  Biotin 44034 MCG TABS Take 10,000 mcg by mouth daily.    [provider]  budesonide-formoterol (SYMBICORT) 80-4.5 MCG/ACT inhaler Inhale 2 puffs into the lungs 2 (two) times daily. 07/24/23   Parrett, Virgel Bouquet, NP  cetirizine (ZYRTEC) 10 MG tablet Take 10 mg by mouth at bedtime.     [provider]  Cholecalciferol (VITAMIN D3) 2000 units TABS Take 2,000 Units by mouth daily.    [provider]  Cyanocobalamin (VITAMIN B-12 PO) Take 1,000 mcg by mouth daily.    [provider]  dextromethorphan-guaiFENesin (MUCINEX DM) 30-600 MG 12hr tablet Take 1-2 tablets by mouth 2 (two) times daily as needed (for congestion/cough (WITH FLUTTER VALVE)).  Patient not taking: Reported on 07/31/2023    [provider]  fluconazole (DIFLUCAN) 100 MG tablet Take 1 tablet (100 mg total) by mouth daily. Patient not taking: Reported on 07/31/2023 05/12/23   Omar Person, MD  fluticasone [facility]) 50 MCG/ACT nasal spray Place 2 sprays into both nostrils daily. 06/10/21   Nyoka Cowden, MD  loperamide (IMODIUM) 2 MG capsule Take 1 capsule (2 mg total) by mouth 4 (four) times daily as needed for diarrhea or loose stools. Patient not taking: Reported on 07/31/2023 04/12/23   Bing Neighbors, NP  Mepolizumab (NUCALA) 100 MG/ML SOAJ Inject into the skin.  [provider]  montelukast (SINGULAIR) 10 MG tablet Take 1 tablet (10 mg total) by mouth at bedtime. 07/20/23   Mannam, Colbert Coyer, MD  nystatin (MYCOSTATIN) 100000 UNIT/ML suspension Take 5 mLs (500,000 Units total) by mouth 4 (four) times daily. 04/24/23   Corwin Levins, MD  oxymetazoline (AFRIN) 0.05 % nasal spray Place 2 sprays into both nostrils 2 (two) times daily as needed (nasal stuffiness and nasal pressure for 5 days).     [provider]  pantoprazole (PROTONIX) 40 MG tablet Take 1 tablet (40 mg total) by mouth daily. 07/27/23   Corwin Levins,  MD  predniSONE (DELTASONE) 10 MG tablet Taper as directed    [provider]  Respiratory Therapy Supplies (FLUTTER) DEVI Use as directed    [provider]  sodium chloride (OCEAN) 0.65 % SOLN nasal spray Place 2 sprays into both nostrils as needed (dry nose).     [provider]  Spacer/Aero-Holding Rudean Curt by Does not apply route.    [provider]  spironolactone (ALDACTONE) 25 MG tablet TAKE 1 TABLET(25 MG) BY MOUTH DAILY 04/23/23   Corwin Levins, MD      Allergies    Iodine, Ivp dye [iodinated contrast media], Promethazine-codeine, and Tramadol    Review of Systems   Review of Systems  Constitutional:  Negative for chills and fever.  HENT:  Negative for trouble swallowing.   Respiratory:  Negative for cough and shortness of breath.   Cardiovascular:  Negative for chest pain.  Gastrointestinal:  Negative for nausea and vomiting.  Skin:  Positive for rash.       Reports itching  Neurological:  Negative for syncope.    Physical Exam Updated Vital Signs BP 130/75 (BP Location: Right Arm)   Pulse 80   Temp 98.1 F (36.7 C) (Oral)   Resp 18   Ht 5\' 1"  (1.549 m)   Wt 77.1 kg   LMP  (LMP Unknown)   SpO2 99%   BMI 32.12 kg/m  Physical Exam Vitals and nursing note reviewed.  Constitutional:      Appearance: Normal appearance.  Eyes:     General: No scleral icterus. Pulmonary:     Effort: Pulmonary effort is normal. No respiratory distress.  Skin:    General: Skin is dry.     Findings: Rash present.     Comments: Pustules with large erythematous base.  Some overlying warmth.  Some swelling noted.  Nontender to touch.  No significant crusting or discharge noted.  No fluctuance or induration.  No red streaking noted.  Patient as well as flexion extension of the ankle and can wiggle her toes.  Palpable DP and PT pulses.  Neurological:     General: No focal deficit present.     Mental Status: She is alert. Mental status is at  baseline.  Psychiatric:        Mood and Affect: Mood normal.          ED Results / Procedures / Treatments   Labs (all labs ordered are listed, but only abnormal results are displayed) Labs Reviewed - No data to display  EKG None  Radiology No results found.  Procedures Procedures   Medications Ordered in ED Medications - No data to display  ED Course/ Medical Decision Making/ A&P  .  Medical Decision Making  72 y.o. female presents to the ER today for evaluation of rash. Differential diagnosis includes but is not limited to chronic dermatitis, allergic reaction,  TENS, SJS, [place] spotted fever, shingles. Vital signs unremarkable. Physical exam as noted above.   Rash does appear to be more consistent with with allergic dermatitis from ant bites or insect bites.  This is not appear consistent with any TENS or SJS as there is no skin sloughing.  The rash is around her ankle however it is not around her other ankle or her wrist.  She denies any recent tick bites.  I do not see any tick attached to her foot or ankle.  I will lower suspicion for any recommended spotted fever.  I will prescribe her some hydrocortisone cream to apply topically to the area.  She does have a follow-up appointment with allergy and asthma tomorrow surprisingly.  I asked that she seek reevaluation there.  Overall, she does not appear to be any acute anaphylaxis she does not have any chest pain or shortness of breath or having nausea vomiting or syncope.  The patient's baseline decrease in sensation to her right foot from her previous stroke, I advised that she not drive because of this as it is dangerous to herself and others.  She is stable for discharge home.  We discussed plan at bedside. We discussed strict return precautions and red flag symptoms. The patient verbalized their understanding and agrees to the plan. The patient is stable and being discharged home in good condition.  Portions of this  report may have been transcribed using voice recognition software. Every effort was made to ensure accuracy; however, inadvertent computerized transcription errors may be present.   Final Clinical Impression(s) / ED Diagnoses Final diagnoses:  Insect bite of right ankle, initial encounter    Rx / DC Orders ED Discharge Orders          Ordered    hydrocortisone cream 1 %        08/06/23 2112              Achille Rich, PA-C 08/07/23 2956    Glyn Ade, MD 08/07/23 1501

## 2023-08-07 NOTE — Progress Notes (Addendum)
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 - I am going to talk to Dr. Sherene Sires about your posiconazole.  - Daily controller medication(s): Symbicort 2 puffs twice daily + Nucala monthly - Prior to physical activity: albuterol 2 puffs 10-15 minutes before physical activity. - Rescue medications: albuterol 4 puffs every 4-6 hours as needed - Asthma control goals:  * Full participation in all desired activities (may need albuterol before activity) * Albuterol use two time or less a week on average (not counting use with activity) * Cough interfering with sleep two time or less a month * Oral steroids no more than once a year * No hospitalizations  2. Chronic rhinitis - with nasal polyps - Previous testing showed: grasses, ragweed, and trees - Continue with: Zyrtec (cetirizine) 10mg  tablet once daily - Continue with: Flonase (fluticasone) one spray per nostril daily (AIM FOR EAR ON EACH SIDE) - You can use an extra dose of the antihistamine, if needed, for breakthrough symptoms.  - Consider nasal saline rinses 1-2 times daily to remove allergens from the nasal cavities as well as help with mucous clearance (this is especially helpful to do before the nasal sprays are given)  3. Recurrent infections - We are going to hold off on further workup now.  - Previous workup was largely normal.   4. Rash  - likely fire ant bites  - Add on the prednisone taper that I sent in: Take two tablets (20mg ) twice daily for three days, then one tablet (10mg ) twice daily for three days, then STOP. - Add on clobetasol ointment twice daily for 1-2 weeks and taper as tolerated (DO NOT ever use on the face).  - We can get a fire ant blood work to see if you are allergic.   5. Return in about 6 months (around 02/04/2024).   Subjective:   Andrea Mason is a 72 y.o. female presenting today for follow up of  Chief Complaint  Patient presents with   Follow-up   Rash    C/o painful  rash and bumps on right leg nurse states may have been bit by fire ants.   Pruritus   Angioedema    Andrea Mason has a history of the following: Patient Active Problem List   Diagnosis Date Noted   Diarrhea 04/24/2023   Leg DVT (deep venous thromboembolism), acute, left (HCC) 09/02/2022   History of stroke 09/02/2022   Lesion of right pinna 08/25/2022   Pain and swelling of left lower leg 08/23/2022   Ankle edema, bilateral 08/01/2022   Elevated serum creatinine 08/01/2022   Bilateral hip joint arthritis 07/16/2022   Right sided sciatica 07/14/2022   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   Recurrent infections 04/13/2022   Anemia 03/06/2022   Thrush 04/12/2021   Urinary frequency 09/22/2020   Wrist pain, acute, left 09/09/2020   CKD (chronic kidney disease) stage 3, GFR 30-59 ml/min (HCC) 12/31/2019  Painful legs and moving toes of right foot 12/16/2019   Skin nodule 11/16/2019   Nocturia 11/16/2019   Low back pain 11/16/2019   Acute recurrent pansinusitis 02/17/2019   Alopecia 01/31/2019   Upper airway cough syndrome 06/14/2018   Rash 05/30/2018   Vitamin D deficiency 08/31/2017   Facial contusion, subsequent encounter 03/12/2017   Vertigo 03/12/2017   Right rotator cuff tear 07/27/2016   Right shoulder pain 07/11/2016   Overactive bladder 01/19/2016   Cough variant asthma  09/07/2015   Burn 09/06/2015   Vocal cord dysfunction 04/05/2015   Asthma 04/05/2015   Left knee pain 03/12/2015   Right knee pain 03/12/2015   Cough 12/08/2014   Skin lumps, generalized 09/08/2014   Toe pain, right 08/06/2014   Pre-ulcerative corn or callous 08/06/2014   Hammertoe 08/06/2014   Swelling of joint, ankle, right 08/06/2014   Pansinusitis 06/02/2014   Cerebral thrombosis with cerebral infarction (HCC) 05/26/2014   Weakness 05/25/2014   Right arm pain 05/25/2014   Numbness in right leg 05/25/2014   Numbness and tingling of right arm 05/25/2014   Embolic stroke (HCC) 03/31/2014   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   Alterations of sensations, late effect of cerebrovascular disease(438.6) 02/10/2014   Cerebral infarction (HCC) 11/25/2013   Hyperlipidemia 11/24/2013   Syncope 11/20/2013   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   Back pain 11/06/2013   Hypokalemia 05/06/2013   Family history of colon cancer 05/06/2013   Lipoma 09/03/2012   Hyperglycemia 08/15/2012   Constipation 01/02/2012   Encounter for well adult exam with abnormal findings 05/24/2011   Seasonal allergic rhinitis due to pollen 02/16/2011   OSTEOARTHRITIS, CERVICAL SPINE 02/09/2011   HYPERSOMNIA WITH SLEEP APNEA UNSPECIFIED 01/26/2011   UTI (urinary tract infection) 11/15/2010   NASAL POLYP 07/28/2010   FIBROIDS, UTERUS 07/27/2010   COMPUTERIZED TOMOGRAPHY, CHEST, ABNORMAL 09/16/2009   Seasonal and perennial allergic rhinitis 05/29/2009   PERIPHERAL EDEMA 05/28/2009   Mild persistent asthma, uncomplicated 11/23/2008   LEG PAIN, LEFT 06/30/2008   COLONIC POLYPS, HX OF 06/30/2008   OBESITY 12/12/2007   Depression 12/12/2007   Essential hypertension 12/12/2007   RHINOSINUSITIS, CHRONIC 12/12/2007   GERD 12/12/2007   ARTHRITIS 12/12/2007    History obtained from: chart review and patient.  Andrea Mason is a 72 y.o. female presenting for a follow up visit.  She was  last seen in June 2023.  At that time, her lung testing looked much better.  We gave her a sample of Nucala to get her started on that.  We continue with Symbicort 160 mcg 2 puffs twice daily as well as Nucala monthly.  For her rhinitis, we continue with Zyrtec as well as Flonase.  We talked about using nasal saline rinses 1-2 times daily.  For history of recurrent infections, she was doing very well without any breakthrough infections.  Since last visit, he has done well.  Asthma/Respiratory Symptom History: She remains on her Symbicort 160 mcg 2 puffs twice daily.  She also is doing her Nucala every month.  This has been very helpful in helping her to avoid systemic steroid use.  She is now seeing Dr. Sherene Sires. She never did start the posiconazole. She is no longer on the prednisone. She is not taking it daily.  She has not been hospitalized.  Overall, she is doing very well.  She was supposed to start posaconazole at some point for her ABPA, but she does not remember ever getting that approved.  Allergic Rhinitis  Symptom History: For her rhinitis, she is doing well with Zyrtec.  She does use the no spray.  She has not had any sinus infections since the last time we saw her.  She does feel like Nucala has helped with her symptoms.  She has not been on any antibiotics for ear infections or any sinopulmonary infections.  She does not remember the last time she had any antibiotics.  Skin Symptom History: She has bits on her ankles that were from the weekend.  She received some kind of topical hydrocortisone, which did not provide much relief.  There has not been no oozing.  She did not have any systemic symptoms including throat tightness or wheezing, although the swelling did pass over her ankle.  It has been very pruritic.  She has never been tested for fire ant allergy.  She remains upbeat despite quite a bit of challenges that life is thrown her way.  She has lost 2 children, including 1 to respiratory  failure status post 2 lung transplants.  Otherwise, there have been no changes to her past medical history, surgical history, family history, or social history.    Review of systems otherwise negative other than that mentioned in the HPI.    Objective:   Blood pressure 110/70, pulse 72, temperature 98.4 F (36.9 C), height 5\' 1"  (1.549 m), weight 172 lb 12.8 oz (78.4 kg), SpO2 100%. Body mass index is 32.65 kg/m.    Physical Exam Vitals reviewed.  Constitutional:      Appearance: She is well-developed.  HENT:     Head: Normocephalic and atraumatic.     Right Ear: Tympanic membrane, ear canal and external ear normal. No drainage, swelling or tenderness. Tympanic membrane is not injected, scarred, erythematous, retracted or bulging.     Left Ear: Tympanic membrane, ear canal and external ear normal. No drainage, swelling or tenderness. Tympanic membrane is not injected, scarred, erythematous, retracted or bulging.     Nose: No nasal deformity, septal deviation, mucosal edema or rhinorrhea.     Right Turbinates: Enlarged, swollen and pale.     Left Turbinates: Enlarged, swollen and pale.     Right Sinus: No maxillary sinus tenderness or frontal sinus tenderness.     Left Sinus: No maxillary sinus tenderness or frontal sinus tenderness.     Comments: She does have nasal polyps obstructing around 50% of the bilateral nasal cavities. This has improved over time and looks much better than when I first met her.     Mouth/Throat:     Mouth: Mucous membranes are not pale and not dry.     Pharynx: Uvula midline.  Eyes:     General: Allergic shiner present.        Right eye: No discharge.        Left eye: No discharge.     Conjunctiva/sclera: Conjunctivae normal.     Right eye: Right conjunctiva is not injected. No chemosis.    Left eye: Left conjunctiva is not injected. No chemosis.    Pupils: Pupils are equal, round, and reactive to light.  Cardiovascular:     Rate and Rhythm:  Normal rate and regular rhythm.     Heart sounds: Normal heart sounds.  Pulmonary:     Effort: Pulmonary effort is normal. No tachypnea, accessory muscle usage or respiratory distress.     Breath sounds: Normal breath sounds. No wheezing, rhonchi or rales.     Comments: Coarse upper airway sounds throughout.  Chest:  Chest wall: No tenderness.  Abdominal:     Tenderness: There is no abdominal tenderness. There is no guarding or rebound.  Lymphadenopathy:     Head:     Right side of head: No submandibular, tonsillar or occipital adenopathy.     Left side of head: No submandibular, tonsillar or occipital adenopathy.     Cervical: No cervical adenopathy.  Skin:    General: Skin is warm.     Capillary Refill: Capillary refill takes less than 2 seconds.     Coloration: Skin is not pale.     Findings: No abrasion, erythema, petechiae or rash. Rash is not papular, urticarial or vesicular.  Neurological:     Mental Status: She is alert.  Psychiatric:        Behavior: Behavior is cooperative.         Diagnostic studies:    Spirometry: results normal (FEV1: 1.20/70%, FVC: 1.57/72%, FEV1/FVC: 76%).    Spirometry consistent with normal pattern.   Allergy Studies: none        Malachi Bonds, MD  [facility]

## 2023-08-08 ENCOUNTER — Encounter: Payer: Self-pay | Admitting: Allergy & Immunology

## 2023-08-12 LAB — ALLERGEN FIRE ANT: I070-IgE Fire Ant (Invicta): 7.15 kU/L — AB

## 2023-08-16 MED ORDER — EPINEPHRINE 0.3 MG/0.3ML IJ SOAJ: 0.3000 mg | INTRAMUSCULAR | 2 refills | Status: DC | PRN

## 2023-08-16 NOTE — Addendum Note (Signed)
Addended by: Elsworth Soho on: 08/16/2023 11:57 AM   Modules accepted: Orders

## 2023-08-21 ENCOUNTER — Ambulatory Visit (INDEPENDENT_AMBULATORY_CARE_PROVIDER_SITE_OTHER): Payer: Medicare HMO | Admitting: *Deleted

## 2023-08-21 ENCOUNTER — Telehealth: Payer: Self-pay

## 2023-08-21 DIAGNOSIS — J4550 Severe persistent asthma, uncomplicated: Secondary | ICD-10-CM | POA: Diagnosis not present

## 2023-08-21 NOTE — Telephone Encounter (Signed)
Transition Care Management Unsuccessful Follow-up Telephone Call  Date of discharge and from where:  08/06/2023 [facility]  Attempts:  1st Attempt  Reason for unsuccessful TCM follow-up call:  No answer/busy  Naren Benally [facility]  [facility], [facility] Resource Care Guide Direct Dial: [phone]  Website: [email or web address]

## 2023-08-22 ENCOUNTER — Telehealth: Payer: Self-pay

## 2023-08-22 NOTE — Telephone Encounter (Signed)
Transition Care Management Unsuccessful Follow-up Telephone Call  Date of discharge and from where:  08/06/2023 [facility]  Attempts:  2nd Attempt  Reason for unsuccessful TCM follow-up call:  Left voice message  Taishawn Smaldone [facility]  [facility], [facility] Resource Care Guide Direct Dial: [phone]  Website: [email or web address]

## 2023-09-06 ENCOUNTER — Encounter: Payer: Self-pay | Admitting: Internal Medicine

## 2023-09-18 ENCOUNTER — Ambulatory Visit: Payer: Medicare HMO

## 2023-09-19 ENCOUNTER — Ambulatory Visit: Payer: Medicare HMO | Admitting: *Deleted

## 2023-09-19 DIAGNOSIS — J4550 Severe persistent asthma, uncomplicated: Secondary | ICD-10-CM | POA: Diagnosis not present

## 2023-10-03 ENCOUNTER — Telehealth: Payer: Self-pay | Admitting: Pulmonary Disease

## 2023-10-03 NOTE — Telephone Encounter (Signed)
Patient states has symptom of thrush. Would like lozenges called into pharmacy. Pharmacy is [facility] [address] Patient phone number is [phone].

## 2023-10-04 MED ORDER — CLOTRIMAZOLE 10 MG MT TROC: 10.0000 mg | Freq: Every day | OROMUCOSAL | 0 refills | Status: AC

## 2023-10-04 NOTE — Telephone Encounter (Signed)
I called and spoke with the pt and notified of response per Dr. Judeth Horn  She verbalized understanding  Nothing further needed

## 2023-10-04 NOTE — Telephone Encounter (Signed)
Sent. thanks

## 2023-10-14 ENCOUNTER — Other Ambulatory Visit: Payer: Self-pay | Admitting: Pulmonary Disease

## 2023-10-15 ENCOUNTER — Telehealth: Payer: Self-pay | Admitting: Pulmonary Disease

## 2023-10-15 NOTE — Telephone Encounter (Signed)
Patient states still having symptoms of cough and mucus. Would like Augmentin called into pharmacy. Pharmacy is [facility] [address]. Patient phone number is [phone].

## 2023-10-15 NOTE — Telephone Encounter (Signed)
Patient is returning phone call. Patient phone number is [phone].

## 2023-10-15 NOTE — Telephone Encounter (Signed)
Recommend Augmentin 875-125 one tab BID x 7 days, prednisone 40 mg daily for 5 days. Please send to local pharmacy -thanks!

## 2023-10-15 NOTE — Telephone Encounter (Signed)
Pt calling in to get a refill for Augmentin

## 2023-10-15 NOTE — Telephone Encounter (Signed)
ATC x2 left a voicemail for patient to call our office back.

## 2023-10-15 NOTE — Telephone Encounter (Signed)
Symptoms since 10/03/23. Cough with green sputum No fever. Does have chills. No wheezing.  No SOB. Taking Mucinex DM- once a day Has not taken a Covid test.  Medications- Symbicort- BID  Has not used her Albuterol Nucala

## 2023-10-15 NOTE — Telephone Encounter (Signed)
Lm x1 for patient.  

## 2023-10-16 ENCOUNTER — Ambulatory Visit: Payer: Medicare HMO | Admitting: *Deleted

## 2023-10-16 DIAGNOSIS — J4550 Severe persistent asthma, uncomplicated: Secondary | ICD-10-CM

## 2023-10-16 MED ORDER — PREDNISONE 20 MG PO TABS: ORAL_TABLET | ORAL | 0 refills | Status: DC

## 2023-10-16 MED ORDER — AMOXICILLIN-POT CLAVULANATE 875-125 MG PO TABS: 1.0000 | ORAL_TABLET | Freq: Two times a day (BID) | ORAL | 0 refills | Status: DC

## 2023-10-16 NOTE — Telephone Encounter (Signed)
Medication has been sent to pharmacy nfn. Atc pt no answer, closing encounter

## 2023-10-17 ENCOUNTER — Other Ambulatory Visit: Payer: Self-pay | Admitting: Internal Medicine

## 2023-10-19 ENCOUNTER — Ambulatory Visit (INDEPENDENT_AMBULATORY_CARE_PROVIDER_SITE_OTHER): Payer: Medicare HMO

## 2023-10-19 DIAGNOSIS — Z0000 Encounter for general adult medical examination without abnormal findings: Secondary | ICD-10-CM | POA: Diagnosis not present

## 2023-10-19 NOTE — Progress Notes (Signed)
Subjective:   Melda Siebels is a 72 y.o. female who presents for an Initial Medicare Annual Wellness Visit.  Visit Complete: Virtual I connected with  [name] on 10/19/23 by a audio enabled telemedicine application and verified that I am speaking with the correct person using two identifiers.  Patient Location: Home  Provider Location: Office/Clinic  I discussed the limitations of evaluation and management by telemedicine. The patient expressed understanding and agreed to proceed.  Vital Signs: Because this visit was a virtual/telehealth visit, some criteria may be missing or patient reported. Any vitals not documented were not able to be obtained and vitals that have been documented are patient reported.    Cardiac Risk Factors include: advanced age (>31men, >19 women);dyslipidemia;hypertension     Objective:    Today's Vitals   There is no height or weight on file to calculate BMI.     10/19/2023    2:35 PM 08/06/2023    8:21 PM 07/16/2023   10:23 PM 09/21/2022    3:30 PM 07/01/2022    5:52 AM 05/13/2022   10:07 PM 03/23/2022    6:54 PM  Advanced Directives  Does Patient Have a Medical Advance Directive? No No No No No No No  Would patient like information on creating a medical advance directive?  No - Patient declined  No - Patient declined       Current Medications (verified) Outpatient Encounter Medications as of 10/19/2023  Medication Sig   acetaminophen (TYLENOL) 325 MG tablet Per bottle as needed   albuterol (VENTOLIN HFA) 108 (90 Base) MCG/ACT inhaler Inhale 1-2 puffs into the lungs every 6 (six) hours as needed for wheezing or shortness of breath.   alendronate (FOSAMAX) 70 MG tablet Take 1 tablet (70 mg total) by mouth every 7 (seven) days. Take with a full glass of water on an empty stomach.   amoxicillin-clavulanate (AUGMENTIN) 875-125 MG tablet Take 1 tablet by mouth 2 (two) times daily.   apixaban (ELIQUIS) 2.5 MG TABS tablet Take 1 tablet (2.5 mg  total) by mouth 2 (two) times daily.   aspirin EC 81 MG tablet Take 1 tablet (81 mg total) by mouth daily. Swallow whole.   Biotin 16109 MCG TABS Take 10,000 mcg by mouth daily.   budesonide-formoterol (SYMBICORT) 80-4.5 MCG/ACT inhaler Inhale 2 puffs into the lungs 2 (two) times daily.   cetirizine (ZYRTEC) 10 MG tablet Take 10 mg by mouth at bedtime.    Cholecalciferol (VITAMIN D3) 2000 units TABS Take 2,000 Units by mouth daily.   clobetasol ointment (TEMOVATE) 0.05 % Apply 1 Application topically 2 (two) times daily. Use for two weeks and wean as tolerated.   Cyanocobalamin (VITAMIN B-12 PO) Take 1,000 mcg by mouth daily.   fluticasone (FLONASE) 50 MCG/ACT nasal spray Place 2 sprays into both nostrils daily.   hydrocortisone cream 1 % Apply to affected area 2 times daily   Mepolizumab (NUCALA) 100 MG/ML SOAJ Inject into the skin.   montelukast (SINGULAIR) 10 MG tablet Take 1 tablet (10 mg total) by mouth at bedtime.   nystatin (MYCOSTATIN) 100000 UNIT/ML suspension Take 5 mLs (500,000 Units total) by mouth 4 (four) times daily.   oxymetazoline (AFRIN) 0.05 % nasal spray Place 2 sprays into both nostrils 2 (two) times daily as needed (nasal stuffiness and nasal pressure for 5 days).    pantoprazole (PROTONIX) 40 MG tablet Take 1 tablet (40 mg total) by mouth daily.   predniSONE (DELTASONE) 20 MG tablet Take 2 tablets everyday for  5 days   Respiratory Therapy Supplies (FLUTTER) DEVI Use as directed   sodium chloride (OCEAN) 0.65 % SOLN nasal spray Place 2 sprays into both nostrils as needed (dry nose).    Spacer/Aero-Holding Rudean Curt by Does not apply route.   spironolactone (ALDACTONE) 25 MG tablet TAKE 1 TABLET(25 MG) BY MOUTH DAILY   EPINEPHrine (EPIPEN 2-PAK) 0.3 mg/0.3 mL IJ SOAJ injection Inject 0.3 mg into the muscle as needed for anaphylaxis. (Patient not taking: Reported on 10/19/2023)   fluconazole (DIFLUCAN) 100 MG tablet Take 1 tablet (100 mg total) by mouth daily. (Patient  not taking: Reported on 10/19/2023)   Facility-Administered Encounter Medications as of 10/19/2023  Medication   mepolizumab (NUCALA) injection 100 mg    Allergies (verified) Iodine, Ivp dye [iodinated contrast media], Promethazine-codeine, and Tramadol   History: Past Medical History:  Diagnosis Date   Allergic rhinitis    Allergy    Asthma    Depression    DJD (degenerative joint disease), cervical    DVT (deep venous thrombosis) (HCC)    GERD (gastroesophageal reflux disease)    History of colonic polyps    Hypertension    Obesity    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   Stroke (HCC)    Syncope    Past Surgical History:  Procedure Laterality Date   COLONOSCOPY     FOOT SURGERY  1998   Implantable loop recorder placement  09/16/14   MDT LINQ implanted by Dr Johney Frame for cryptogenic stroke, RIO II protocol   LOOP RECORDER REMOVAL N/A 10/02/2017   Procedure: LOOP RECORDER REMOVAL;  Surgeon: Hillis Range, MD;  Location: [facility];  Service: Cardiovascular;  Laterality: N/A;   NASAL POLYP SURGERY  07/2006   x 2 with Dr. Annalee Genta   NASAL SINUS SURGERY  07/2006   Dr. Annalee Genta   TUBAL LIGATION     Family History  Problem Relation Age of Onset   Asthma Mother    Allergic rhinitis Mother    Cancer Mother    Allergic rhinitis Father    Asthma Sister    Allergic rhinitis Sister    Diabetes Sister    Allergic rhinitis Brother    Colon cancer Brother    Stroke Brother    Arrhythmia Brother    Allergic rhinitis Brother    Congenital heart disease Brother    Heart attack Brother    Lung disease Son        Sarcoid vs ARDS related fibrosis, required 2 double lung transplants   Esophageal cancer Neg Hx    Rectal cancer Neg Hx    Stomach cancer Neg Hx    Social History   Socioeconomic History   Marital status: Widowed    Spouse name: Not on file   Number of children: 3   Years of education: Not on file   Highest education level: Not on file   Occupational History   Occupation: Museum/gallery curator    Comment: [employer]  Tobacco Use   Smoking status: Never    Passive exposure: Current (on sister's clothing bc sister is a smoker)   Smokeless tobacco: Never  Vaping Use   Vaping status: Never Used  Substance and Sexual Activity   Alcohol use: Yes    Comment: socially wine   Drug use: No   Sexual activity: Not Currently    Birth control/protection: Post-menopausal  Other Topics Concern   Not on file  Social History Narrative   Widowed, husband died in 06-Nov-2006  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   Patient lives alone.   Social Determinants of Health   Financial Resource Strain: Low Risk  (10/19/2023)   Overall Financial Resource Strain (CARDIA)    Difficulty of Paying Living Expenses: Not hard at all  Food Insecurity: No Food Insecurity (10/19/2023)   Hunger Vital Sign    Worried About Running Out of Food in the Last Year: Never true    Ran Out of Food in the Last Year: Never true  Transportation Needs: No Transportation Needs (10/19/2023)   PRAPARE - Administrator, Civil Service (Medical): No    Lack of Transportation (Non-Medical): No  Physical Activity: Inactive (10/19/2023)   Exercise Vital Sign    Days of Exercise per Week: 0 days    Minutes of Exercise per Session: 0 min  Stress: No Stress Concern Present (10/19/2023)   [employer] of Occupational Health - Occupational Stress Questionnaire    Feeling of Stress : Not at all  Social Connections: Moderately Integrated (10/19/2023)   Social Connection and Isolation Panel [NHANES]    Frequency of Communication with Friends and Family: More than three times a week    Frequency of Social Gatherings with Friends and Family: Twice a week    Attends Religious Services: More than 4 times per year    Active Member of [employer] or Organizations: Yes    Attends Banker Meetings:  More than 4 times per year    Marital Status: Widowed    Tobacco Counseling Counseling given: Not Answered   Clinical Intake:  Pre-visit preparation completed: Yes  Pain : No/denies pain     Nutritional Risks: None Diabetes: No  How often do you need to have someone help you when you read instructions, pamphlets, or other written materials from your doctor or pharmacy?: 1 - Never  Interpreter Needed?: No  Information entered by :: NAllen LPN   Activities of Daily Living    10/19/2023    2:27 PM  In your present state of health, do you have any difficulty performing the following activities:  Hearing? 0  Vision? 0  Difficulty concentrating or making decisions? 0  Walking or climbing stairs? 0  Dressing or bathing? 0  Doing errands, shopping? 0  Preparing Food and eating ? N  Using the Toilet? N  In the past six months, have you accidently leaked urine? N  Do you have problems with loss of bowel control? N  Managing your Medications? N  Managing your Finances? N  Housekeeping or managing your Housekeeping? N    Patient Care Team: Corwin Levins, MD as PCP - General Micki Riley, MD as Consulting Physician (Neurology) Szabat, Vinnie Level, [facility] (Inactive) as Pharmacist (Pharmacist)  Indicate any recent Medical Services you may have received from other than [facility] providers in the past year (date may be approximate).     Assessment:   This is a routine wellness examination for Yanina.  Hearing/Vision screen Hearing Screening - Comments:: Denies hearing issues Vision Screening - Comments:: Regular eye exams, [facility]   Goals Addressed             This Visit's Progress    Patient Stated       10/19/2023, continue to try and lose weight       Depression Screen    10/19/2023    2:42 PM 04/24/2023  2:29 PM 01/24/2023    2:44 PM 12/05/2022    2:57 PM 09/01/2022   10:15 AM 08/23/2022    1:31 PM 07/14/2022   11:30 AM  PHQ 2/9 Scores  PHQ - 2 Score  0 1 0 0  0 0  PHQ- 9 Score  6  0  0 0  Exception Documentation     Patient refusal      Fall Risk    10/19/2023    2:42 PM 04/24/2023    2:29 PM 09/01/2022   10:16 AM 08/23/2022    1:31 PM 07/14/2022   11:30 AM  Fall Risk   Falls in the past year? 0 0 0 0 0  Number falls in past yr: 0 0  0 0  Injury with Fall? 0 0 0 0 0  Risk for fall due to : Medication side effect No Fall Risks No Fall Risks No Fall Risks No Fall Risks  Follow up Falls prevention discussed;Falls evaluation completed Falls evaluation completed Follow up appointment Falls evaluation completed Falls evaluation completed    MEDICARE RISK AT HOME: Medicare Risk at Home Any stairs in or around the home?: Yes If so, are there any without handrails?: No Home free of loose throw rugs in walkways, pet beds, electrical cords, etc?: Yes Adequate lighting in your home to reduce risk of falls?: Yes Life alert?: No Use of a cane, walker or w/c?: No Grab bars in the bathroom?: No Shower chair or bench in shower?: No Elevated toilet seat or a handicapped toilet?: No  TIMED UP AND GO:  Was the test performed? No    Cognitive Function:        10/19/2023    2:42 PM  6CIT Screen  What Year? 0 points  What month? 0 points  What time? 0 points  Count back from 20 0 points  Months in reverse 2 points  Repeat phrase 4 points  Total Score 6 points    Immunizations Immunization History  Administered Date(s) Administered   Fluad Quad(high Dose 65+) 08/23/2022   Influenza Split 09/03/2012   Influenza Whole 09/27/2006, 08/23/2007, 11/16/2009, 08/25/2010   Influenza,inj,Quad PF,6+ Mos 08/21/2013, 08/06/2014, 10/05/2015   PFIZER(Purple Top)SARS-COV-2 Vaccination 03/04/2020, 03/29/2020   Pneumococcal Conjugate-13 12/02/2014   Pneumococcal Polysaccharide-23 06/03/2019   Tdap 05/06/2013, 08/25/2015   Zoster, Live 05/06/2013    TDAP status: Up to date  Flu Vaccine status: Declined, Education has been provided regarding  the importance of this vaccine but patient still declined. Advised may receive this vaccine at local pharmacy or Health Dept. Aware to provide a copy of the vaccination record if obtained from local pharmacy or Health Dept. Verbalized acceptance and understanding.  Pneumococcal vaccine status: Up to date  Covid-19 vaccine status: Information provided on how to obtain vaccines.   Qualifies for Shingles Vaccine? Yes   Zostavax completed Yes   Shingrix Completed?: No.    Education has been provided regarding the importance of this vaccine. Patient has been advised to call insurance company to determine out of pocket expense if they have not yet received this vaccine. Advised may also receive vaccine at local pharmacy or Health Dept. Verbalized acceptance and understanding.  Screening Tests Health Maintenance  Topic Date Due   Zoster Vaccines- Shingrix (1 of 2) 01/07/2003   INFLUENZA VACCINE  03/03/2024 (Originally 07/05/2023)   Colonoscopy  07/01/2024   Medicare Annual Wellness (AWV)  10/18/2024   MAMMOGRAM  12/11/2024   DTaP/Tdap/Td (3 - Td or Tdap) 08/24/2025  Pneumonia Vaccine 31+ Years old  Completed   DEXA SCAN  Completed   Hepatitis C Screening  Completed   HPV VACCINES  Aged Out   COVID-19 Vaccine  Discontinued    Health Maintenance  Health Maintenance Due  Topic Date Due   Zoster Vaccines- Shingrix (1 of 2) 01/07/2003    Colorectal cancer screening: Type of screening: Colonoscopy. Completed 07/02/2019. Repeat every 5 years  Mammogram status: Completed 12/11/2022. Repeat every year  Bone Density status: Completed 12/26/2022.   Lung Cancer Screening: (Low Dose CT Chest recommended if Age 53-80 years, 20 pack-year currently smoking OR have quit w/in 15years.) does not qualify.   Lung Cancer Screening Referral: no  Additional Screening:  Hepatitis C Screening: does qualify; Completed 05/30/2018  Vision Screening: Recommended annual ophthalmology exams for early detection of  glaucoma and other disorders of the eye. Is the patient up to date with their annual eye exam?  No  Who is the provider or what is the name of the office in which the patient attends annual eye exams? [facility] If pt is not established with a provider, would they like to be referred to a provider to establish care? No .   Dental Screening: Recommended annual dental exams for proper oral hygiene  Diabetic Foot Exam: n/a  Community Resource Referral / Chronic Care Management: CRR required this visit?  No   CCM required this visit?  No     Plan:     I have personally reviewed and noted the following in the patient's chart:   Medical and social history Use of alcohol, tobacco or illicit drugs  Current medications and supplements including opioid prescriptions. Patient is not currently taking opioid prescriptions. Functional ability and status Nutritional status Physical activity Advanced directives List of other physicians Hospitalizations, surgeries, and ER visits in previous 12 months Vitals Screenings to include cognitive, depression, and falls Referrals and appointments  In addition, I have reviewed and discussed with patient certain preventive protocols, quality metrics, and best practice recommendations. A written personalized care plan for preventive services as well as general preventive health recommendations were provided to patient.     Barb Merino, LPN   [id number]   After Visit Summary: (Pick Up) Due to this being a telephonic visit, with patients personalized plan was offered to patient and patient has requested to Pick up at office.  Nurse Notes: none

## 2023-10-19 NOTE — Patient Instructions (Signed)
Andrea Mason , Thank you for taking time to come for your Medicare Wellness Visit. I appreciate your ongoing commitment to your health goals. Please review the following plan we discussed and let me know if I can assist you in the future.   Referrals/Orders/Follow-Ups/Clinician Recommendations: none  This is a list of the screening recommended for you and due dates:  Health Maintenance  Topic Date Due   Zoster (Shingles) Vaccine (1 of 2) 01/07/2003   Flu Shot  03/03/2024*   Colon Cancer Screening  07/01/2024   Medicare Annual Wellness Visit  10/18/2024   Mammogram  12/11/2024   DTaP/Tdap/Td vaccine (3 - Td or Tdap) 08/24/2025   Pneumonia Vaccine  Completed   DEXA scan (bone density measurement)  Completed   Hepatitis C Screening  Completed   HPV Vaccine  Aged Out   COVID-19 Vaccine  Discontinued  *Topic was postponed. The date shown is not the original due date.    Advanced directives: (ACP Link)Information on Advanced Care Planning can be found at [facility] Advance Health Care Directives Advance Health Care Directives ([email or web address]   Next Medicare Annual Wellness Visit scheduled for next year: Yes  Insert Preventive Care attachment Insert FALL PREVENTION attachment if needed

## 2023-10-29 ENCOUNTER — Other Ambulatory Visit: Payer: Self-pay | Admitting: Internal Medicine

## 2023-10-29 ENCOUNTER — Other Ambulatory Visit: Payer: Self-pay

## 2023-11-13 ENCOUNTER — Ambulatory Visit: Payer: Medicare HMO

## 2023-11-15 ENCOUNTER — Ambulatory Visit: Payer: Medicare HMO

## 2023-11-15 DIAGNOSIS — J4550 Severe persistent asthma, uncomplicated: Secondary | ICD-10-CM

## 2023-11-16 ENCOUNTER — Other Ambulatory Visit: Payer: Self-pay | Admitting: Allergy & Immunology

## 2023-11-18 ENCOUNTER — Other Ambulatory Visit: Payer: Self-pay | Admitting: Pulmonary Disease

## 2023-11-20 ENCOUNTER — Telehealth: Payer: Self-pay | Admitting: Adult Health

## 2023-11-20 NOTE — Telephone Encounter (Signed)
Patient would like lozenges for thrush called into pharmacy. Pharmacy is [facility] [address] Patient phone number is [phone].

## 2023-11-23 NOTE — Telephone Encounter (Signed)
Called the pt and there was no answer- LMTCB    

## 2023-11-27 ENCOUNTER — Ambulatory Visit: Payer: Medicare HMO | Admitting: Internal Medicine

## 2023-12-10 ENCOUNTER — Ambulatory Visit: Payer: PPO | Admitting: Internal Medicine

## 2023-12-11 ENCOUNTER — Ambulatory Visit: Payer: Medicare HMO | Admitting: *Deleted

## 2023-12-11 DIAGNOSIS — J4550 Severe persistent asthma, uncomplicated: Secondary | ICD-10-CM

## 2023-12-31 ENCOUNTER — Ambulatory Visit (INDEPENDENT_AMBULATORY_CARE_PROVIDER_SITE_OTHER): Payer: Medicare HMO | Admitting: Internal Medicine

## 2023-12-31 VITALS — BP 120/80 | HR 76 | Temp 97.6°F | Ht 61.0 in | Wt 169.2 lb

## 2023-12-31 DIAGNOSIS — I10 Essential (primary) hypertension: Secondary | ICD-10-CM | POA: Diagnosis not present

## 2023-12-31 DIAGNOSIS — R1010 Upper abdominal pain, unspecified: Secondary | ICD-10-CM

## 2023-12-31 DIAGNOSIS — N764 Abscess of vulva: Secondary | ICD-10-CM | POA: Insufficient documentation

## 2023-12-31 DIAGNOSIS — E538 Deficiency of other specified B group vitamins: Secondary | ICD-10-CM | POA: Diagnosis not present

## 2023-12-31 DIAGNOSIS — N1831 Chronic kidney disease, stage 3a: Secondary | ICD-10-CM | POA: Diagnosis not present

## 2023-12-31 DIAGNOSIS — E559 Vitamin D deficiency, unspecified: Secondary | ICD-10-CM

## 2023-12-31 DIAGNOSIS — E7849 Other hyperlipidemia: Secondary | ICD-10-CM

## 2023-12-31 DIAGNOSIS — R739 Hyperglycemia, unspecified: Secondary | ICD-10-CM

## 2023-12-31 DIAGNOSIS — Z0001 Encounter for general adult medical examination with abnormal findings: Secondary | ICD-10-CM

## 2023-12-31 MED ORDER — DOXYCYCLINE HYCLATE 100 MG PO TABS: 100.0000 mg | ORAL_TABLET | Freq: Two times a day (BID) | ORAL | 0 refills | Status: DC

## 2023-12-31 NOTE — Progress Notes (Unsigned)
Chief Complaint:: wellness exam and labial boil, ckd3a, htn, hyperglycemia, hld        HPI:  Andrea Mason is a 73 y.o. female here for wellness exam; for shingrix at pharmacy, delines flu shot, o/w up to date                        Also has labial boil, asks for tx and gyn referral.  Pt denies chest pain, increased sob or doe, wheezing, orthopnea, PND, increased LE swelling, palpitations, dizziness or syncope.   Pt denies polydipsia, polyuria, or new focal neuro s/s.    Pt denies fever, wt loss, night sweats, loss of appetite, or other constitutional symptoms  Denies worsening reflux, dysphagia, n/v, bowel change or blood, but has also worsening gen'd abd pain, gas like, bloating.    Wt Readings from Last 3 Encounters:  12/31/23 169 lb 4 oz (76.8 kg)  08/07/23 172 lb 12.8 oz (78.4 kg)  08/06/23 170 lb (77.1 kg)   BP Readings from Last 3 Encounters:  12/31/23 120/80  08/07/23 110/70  08/06/23 130/75   Immunization History  Administered Date(s) Administered   Fluad Quad(high Dose 65+) 08/23/2022   Influenza Split 09/03/2012   Influenza Whole 09/27/2006, 08/23/2007, 11/16/2009, 08/25/2010   Influenza,inj,Quad PF,6+ Mos 08/21/2013, 08/06/2014, 10/05/2015   PFIZER(Purple Top)SARS-COV-2 Vaccination 03/04/2020, 03/29/2020   Pneumococcal Conjugate-13 12/02/2014   Pneumococcal Polysaccharide-23 06/03/2019   Tdap 05/06/2013, 08/25/2015   Zoster, Live 05/06/2013   Health Maintenance Due  Topic Date Due   Zoster Vaccines- Shingrix (1 of 2) 01/07/2003      Past Medical History:  Diagnosis Date   Allergic rhinitis    Allergy    Asthma    Depression    DJD (degenerative joint disease), cervical    DVT (deep venous thrombosis) (HCC)    GERD (gastroesophageal reflux disease)    History of colonic polyps    Hypertension    Obesity    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   Stroke (HCC)    Syncope    Past Surgical History:  Procedure Laterality Date   COLONOSCOPY      FOOT SURGERY  1998   Implantable loop recorder placement  09/16/14   MDT LINQ implanted by Dr Johney Frame for cryptogenic stroke, RIO II protocol   LOOP RECORDER REMOVAL N/A 10/02/2017   Procedure: LOOP RECORDER REMOVAL;  Surgeon: Hillis Range, MD;  Location: [facility];  Service: Cardiovascular;  Laterality: N/A;   NASAL POLYP SURGERY  07/2006   x 2 with Dr. Annalee Genta   NASAL SINUS SURGERY  07/2006   Dr. Annalee Genta   TUBAL LIGATION      reports that she has never smoked. She has been exposed to tobacco smoke. She has never used smokeless tobacco. She reports current alcohol use. She reports that she does not use drugs. family history includes Allergic rhinitis in her brother, brother, father, mother, and sister; Arrhythmia in her brother; Asthma in her mother and sister; Cancer in her mother; Colon cancer in her brother; Congenital heart disease in her brother; Diabetes in her sister; Heart attack in her brother; Lung disease in her son; Stroke in her brother. Allergies  Allergen Reactions   Iodine Nausea And Vomiting and Rash    Reaction: hot flashes   Ivp Dye [Iodinated Contrast Media] Nausea And Vomiting and Other (See Comments)    hot flashes   Promethazine-Codeine Nausea And Vomiting   Tramadol Other (  See Comments)    Almost passed out   Current Outpatient Medications on File Prior to Visit  Medication Sig Dispense Refill   acetaminophen (TYLENOL) 325 MG tablet Per bottle as needed     albuterol (VENTOLIN HFA) 108 (90 Base) MCG/ACT inhaler Inhale 1-2 puffs into the lungs every 6 (six) hours as needed for wheezing or shortness of breath. 1 each 11   alendronate (FOSAMAX) 70 MG tablet TAKE 1 TABLET(70 MG) BY MOUTH EVERY 7 DAYS WITH A FULL GLASS OF WATER AND ON AN EMPTY STOMACH 12 tablet 3   apixaban (ELIQUIS) 2.5 MG TABS tablet Take 1 tablet (2.5 mg total) by mouth 2 (two) times daily. 180 tablet 3   aspirin EC 81 MG tablet Take 1 tablet (81 mg total) by mouth daily. Swallow  whole. 30 tablet 12   Biotin 16109 MCG TABS Take 10,000 mcg by mouth daily.     budesonide-formoterol (SYMBICORT) 80-4.5 MCG/ACT inhaler Inhale 2 puffs into the lungs 2 (two) times daily. 1 each 5   cetirizine (ZYRTEC) 10 MG tablet Take 10 mg by mouth at bedtime.      Cholecalciferol (VITAMIN D3) 2000 units TABS Take 2,000 Units by mouth daily.     clobetasol ointment (TEMOVATE) 0.05 % APPLY TO THE AFFECTED AREA TWICE DAILY FOR 2 WEEKS AND WEAN AS TOLERATED 30 g 0   Cyanocobalamin (VITAMIN B-12 PO) Take 1,000 mcg by mouth daily.     EPINEPHrine (EPIPEN 2-PAK) 0.3 mg/0.3 mL IJ SOAJ injection Inject 0.3 mg into the muscle as needed for anaphylaxis. 2 each 2   fluconazole (DIFLUCAN) 100 MG tablet Take 1 tablet (100 mg total) by mouth daily. 14 tablet 0   fluticasone (FLONASE) 50 MCG/ACT nasal spray Place 2 sprays into both nostrils daily. 16 g 5   hydrocortisone cream 1 % Apply to affected area 2 times daily 15 g 0   Mepolizumab (NUCALA) 100 MG/ML SOAJ Inject into the skin.     montelukast (SINGULAIR) 10 MG tablet Take 1 tablet (10 mg total) by mouth at bedtime. 30 tablet 11   nystatin (MYCOSTATIN) 100000 UNIT/ML suspension Take 5 mLs (500,000 Units total) by mouth 4 (four) times daily. 180 mL 1   oxymetazoline (AFRIN) 0.05 % nasal spray Place 2 sprays into both nostrils 2 (two) times daily as needed (nasal stuffiness and nasal pressure for 5 days).      pantoprazole (PROTONIX) 40 MG tablet Take 1 tablet (40 mg total) by mouth daily. 90 tablet 3   predniSONE (DELTASONE) 20 MG tablet Take 2 tablets everyday for 5 days 10 tablet 0   Respiratory Therapy Supplies (FLUTTER) DEVI Use as directed     sodium chloride (OCEAN) 0.65 % SOLN nasal spray Place 2 sprays into both nostrils as needed (dry nose).      Spacer/Aero-Holding Rudean Curt by Does not apply route.     spironolactone (ALDACTONE) 25 MG tablet TAKE 1 TABLET(25 MG) BY MOUTH DAILY 90 tablet 2   Current Facility-Administered Medications on  File Prior to Visit  Medication Dose Route Frequency Provider Last Rate Last Admin   mepolizumab (NUCALA) injection 100 mg  100 mg Subcutaneous Q28 days Alfonse Spruce, MD   100 mg at 12/11/23 1515        ROS:  All others reviewed and negative.  Objective        PE:  BP 120/80   Pulse 76   Temp 97.6 F (36.4 C) (Temporal)   Ht 5\' 1"  (1.549  m)   Wt 169 lb 4 oz (76.8 kg)   LMP  (LMP Unknown)   SpO2 93%   BMI 31.98 kg/m                 Constitutional: Pt appears in NAD               HENT: Head: NCAT.                Right Ear: External ear normal.                 Left Ear: External ear normal.                Eyes: . Pupils are equal, round, and reactive to light. Conjunctivae and EOM are normal               Nose: without d/c or deformity               Neck: Neck supple. Gross normal ROM               Cardiovascular: Normal rate and regular rhythm.                 Pulmonary/Chest: Effort normal and breath sounds without rales or wheezing.                Abd:  Soft, NT, ND, + BS, no organomegaly               Neurological: Pt is alert. At baseline orientation, motor grossly intact               Skin: Skin is warm. No rashes, no other new lesions, LE edema - none               Psychiatric: Pt behavior is normal without agitation   Micro: none  Cardiac tracings I have personally interpreted today:  none  Pertinent Radiological findings (summarize): none   Lab Results  Component Value Date   WBC 4.1 01/01/2024   HGB 11.3 (L) 01/01/2024   HCT 33.9 (L) 01/01/2024   PLT 246.0 01/01/2024   GLUCOSE 94 01/01/2024   CHOL 135 01/01/2024   TRIG 62.0 01/01/2024   HDL 69.70 01/01/2024   LDLCALC 53 01/01/2024   ALT 10 01/01/2024   AST 18 01/01/2024   NA 134 (L) 01/01/2024   K 4.4 01/01/2024   CL 101 01/01/2024   CREATININE 0.87 01/01/2024   BUN 10 01/01/2024   CO2 27 01/01/2024   TSH 0.99 01/01/2024   INR 1.0 12/26/2019   HGBA1C 5.9 01/01/2024   Assessment/Plan:   Zareah Hunzeker is a 73 y.o. Black or African American [2] female with  has a past medical history of Allergic rhinitis, Allergy, Asthma, Depression, DJD (degenerative joint disease), cervical, DVT (deep venous thrombosis) (HCC), GERD (gastroesophageal reflux disease), History of colonic polyps, Hypertension, Obesity, Primary hyperaldosteronism (HCC) (11/06/2013), Stroke (HCC), and Syncope.  Encounter for well adult exam with abnormal findings Age and sex appropriate education and counseling updated with regular exercise and diet Referrals for preventative services - none needed Immunizations addressed - for shingrix at pharmacy, declines flu shot Smoking counseling  - none needed Evidence for depression or other mood disorder - none significant Most recent labs reviewed. I have personally reviewed and have noted: 1) the patient's medical and social history 2) The patient's current medications and supplements 3) The patient's height, weight, and BMI have been recorded in the chart  CKD (chronic kidney disease) stage 3, GFR 30-59 ml/min (HCC) Lab Results  Component Value Date   CREATININE 0.87 01/01/2024   Stable overall, cont to avoid nephrotoxins  Essential hypertension BP Readings from Last 3 Encounters:  12/31/23 120/80  08/07/23 110/70  08/06/23 130/75   Stable, pt to continue medical treatment  - diet,wt control   Hyperglycemia Lab Results  Component Value Date   HGBA1C 5.9 01/01/2024   Stable, pt to continue current medical treatment  - diet, wt control   Hyperlipidemia Lab Results  Component Value Date   LDLCALC 53 01/01/2024   Stable, pt to continue current statin   - diet,wt control   Upper abdominal pain Etiology unclear, for lab today, refer GI per pt reqeust  Vitamin D deficiency Last vitamin D Lab Results  Component Value Date   VD25OH 38.52 01/01/2024   Low, to start oral replacement   Labium boil Mild to mod, for antibx course doxycyline  100 bid, refer GYN,  to f/u any worsening symptoms or concerns  Followup: Return in about 6 months (around 06/29/2024).  Oliver Barre, MD 01/03/2024 8:03 PM [facility]

## 2023-12-31 NOTE — Patient Instructions (Signed)
Please take all new medication as prescribed - the antibiotic  Please continue all other medications as before, and refills have been done if requested.  Please have the pharmacy call with any other refills you may need.  Please continue your efforts at being more active, low cholesterol diet, and weight control.  You are otherwise up to date with prevention measures today.  Please keep your appointments with your specialists as you may have planned  You will be contacted regarding the referral for: GYN, and Gastroenterology  Please go to the LAB at the blood drawing area for the tests to be done  You will be contacted by phone if any changes need to be made immediately.  Otherwise, you will receive a letter about your results with an explanation, but please check with MyChart first.  Please make an Appointment to return in 6 months, or sooner if needed

## 2024-01-01 ENCOUNTER — Other Ambulatory Visit (INDEPENDENT_AMBULATORY_CARE_PROVIDER_SITE_OTHER): Payer: Medicare HMO

## 2024-01-01 ENCOUNTER — Encounter: Payer: Self-pay | Admitting: Internal Medicine

## 2024-01-01 DIAGNOSIS — I10 Essential (primary) hypertension: Secondary | ICD-10-CM | POA: Diagnosis not present

## 2024-01-01 DIAGNOSIS — E538 Deficiency of other specified B group vitamins: Secondary | ICD-10-CM

## 2024-01-01 DIAGNOSIS — R739 Hyperglycemia, unspecified: Secondary | ICD-10-CM

## 2024-01-01 DIAGNOSIS — E559 Vitamin D deficiency, unspecified: Secondary | ICD-10-CM | POA: Diagnosis not present

## 2024-01-01 LAB — CBC WITH DIFFERENTIAL/PLATELET
Basophils Absolute: 0 10*3/uL (ref 0.0–0.1)
Basophils Relative: 1.2 % (ref 0.0–3.0)
Eosinophils Absolute: 0.1 10*3/uL (ref 0.0–0.7)
Eosinophils Relative: 3.2 % (ref 0.0–5.0)
HCT: 33.9 % — ABNORMAL LOW (ref 36.0–46.0)
Hemoglobin: 11.3 g/dL — ABNORMAL LOW (ref 12.0–15.0)
Lymphocytes Relative: 38.3 % (ref 12.0–46.0)
Lymphs Abs: 1.6 10*3/uL (ref 0.7–4.0)
MCHC: 33.4 g/dL (ref 30.0–36.0)
MCV: 92.1 fL (ref 78.0–100.0)
Monocytes Absolute: 0.4 10*3/uL (ref 0.1–1.0)
Monocytes Relative: 8.8 % (ref 3.0–12.0)
Neutro Abs: 2 10*3/uL (ref 1.4–7.7)
Neutrophils Relative %: 48.5 % (ref 43.0–77.0)
Platelets: 246 10*3/uL (ref 150.0–400.0)
RBC: 3.67 Mil/uL — ABNORMAL LOW (ref 3.87–5.11)
RDW: 13.6 % (ref 11.5–15.5)
WBC: 4.1 10*3/uL (ref 4.0–10.5)

## 2024-01-01 LAB — TSH: TSH: 0.99 u[IU]/mL (ref 0.35–5.50)

## 2024-01-01 LAB — HEPATIC FUNCTION PANEL
ALT: 10 U/L (ref 0–35)
AST: 18 U/L (ref 0–37)
Albumin: 4.1 g/dL (ref 3.5–5.2)
Alkaline Phosphatase: 77 U/L (ref 39–117)
Bilirubin, Direct: 0.2 mg/dL (ref 0.0–0.3)
Total Bilirubin: 0.6 mg/dL (ref 0.2–1.2)
Total Protein: 7.2 g/dL (ref 6.0–8.3)

## 2024-01-01 LAB — URINALYSIS, ROUTINE W REFLEX MICROSCOPIC
Bilirubin Urine: NEGATIVE
Hgb urine dipstick: NEGATIVE
Ketones, ur: NEGATIVE
Leukocytes,Ua: NEGATIVE
Nitrite: NEGATIVE
RBC / HPF: NONE SEEN (ref 0–?)
Specific Gravity, Urine: <=1.005 — AB (ref 1.000–1.030)
Total Protein, Urine: NEGATIVE
Urine Glucose: NEGATIVE
Urobilinogen, UA: 0.2 (ref 0.0–1.0)
WBC, UA: NONE SEEN (ref 0–?)
pH: 6 (ref 5.0–8.0)

## 2024-01-01 LAB — LIPID PANEL
Cholesterol: 135 mg/dL (ref 0–200)
HDL: 69.7 mg/dL (ref >39.00)
LDL Cholesterol: 53 mg/dL (ref 0–99)
NonHDL: 64.92
Total CHOL/HDL Ratio: 2
Triglycerides: 62 mg/dL (ref 0.0–149.0)
VLDL: 12.4 mg/dL (ref 0.0–40.0)

## 2024-01-01 LAB — BASIC METABOLIC PANEL
BUN: 10 mg/dL (ref 6–23)
CO2: 27 meq/L (ref 19–32)
Calcium: 9.2 mg/dL (ref 8.4–10.5)
Chloride: 101 meq/L (ref 96–112)
Creatinine, Ser: 0.87 mg/dL (ref 0.40–1.20)
GFR: 67.24 mL/min (ref >60.00)
Glucose, Bld: 94 mg/dL (ref 70–99)
Potassium: 4.4 meq/L (ref 3.5–5.1)
Sodium: 134 meq/L — ABNORMAL LOW (ref 135–145)

## 2024-01-01 LAB — VITAMIN D 25 HYDROXY (VIT D DEFICIENCY, FRACTURES): VITD: 38.52 ng/mL (ref 30.00–100.00)

## 2024-01-01 LAB — HEMOGLOBIN A1C: Hgb A1c MFr Bld: 5.9 % (ref 4.6–6.5)

## 2024-01-01 LAB — VITAMIN B12: Vitamin B-12: >1537 pg/mL — ABNORMAL HIGH (ref 211–911)

## 2024-01-01 NOTE — Progress Notes (Signed)
The test results show that your current treatment is OK, as the tests are stable.  Please continue the same plan.  There is no other need for change of treatment or further evaluation based on these results, at this time.  thanks

## 2024-01-03 ENCOUNTER — Encounter: Payer: Self-pay | Admitting: Internal Medicine

## 2024-01-03 NOTE — Assessment & Plan Note (Signed)
Last vitamin D Lab Results  Component Value Date   VD25OH 38.52 01/01/2024   Low, to start oral replacement

## 2024-01-03 NOTE — Assessment & Plan Note (Signed)
Lab Results  Component Value Date   HGBA1C 5.9 01/01/2024   Stable, pt to continue current medical treatment  - diet, wt control

## 2024-01-03 NOTE — Assessment & Plan Note (Signed)
Etiology unclear, for lab today, refer GI per pt reqeust

## 2024-01-03 NOTE — Assessment & Plan Note (Signed)
Lab Results  Component Value Date   LDLCALC 53 01/01/2024   Stable, pt to continue current statin   - diet,wt control

## 2024-01-03 NOTE — Assessment & Plan Note (Signed)
Age and sex appropriate education and counseling updated with regular exercise and diet Referrals for preventative services - none needed Immunizations addressed - for shingrix at pharmacy, declines flu shot Smoking counseling  - none needed Evidence for depression or other mood disorder - none significant Most recent labs reviewed. I have personally reviewed and have noted: 1) the patient's medical and social history 2) The patient's current medications and supplements 3) The patient's height, weight, and BMI have been recorded in the chart

## 2024-01-03 NOTE — Assessment & Plan Note (Signed)
BP Readings from Last 3 Encounters:  12/31/23 120/80  08/07/23 110/70  08/06/23 130/75   Stable, pt to continue medical treatment  - diet,wt control

## 2024-01-03 NOTE — Assessment & Plan Note (Signed)
Lab Results  Component Value Date   CREATININE 0.87 01/01/2024   Stable overall, cont to avoid nephrotoxins

## 2024-01-03 NOTE — Assessment & Plan Note (Signed)
Mild to mod, for antibx course doxycyline 100 bid, refer GYN,  to f/u any worsening symptoms or concerns

## 2024-01-07 ENCOUNTER — Other Ambulatory Visit: Payer: Self-pay

## 2024-01-07 ENCOUNTER — Ambulatory Visit: Payer: Medicare HMO | Admitting: Family Medicine

## 2024-01-07 VITALS — BP 122/86 | HR 75 | Ht 61.0 in | Wt 169.0 lb

## 2024-01-07 DIAGNOSIS — G8929 Other chronic pain: Secondary | ICD-10-CM

## 2024-01-07 DIAGNOSIS — M25561 Pain in right knee: Secondary | ICD-10-CM

## 2024-01-07 NOTE — Patient Instructions (Signed)
Thank you for coming in today.   You received an injection today. Seek immediate medical attention if the joint becomes red, extremely painful, or is oozing fluid.   Please use Voltaren gel (Generic Diclofenac Gel) up to 4x daily for pain as needed.  This is available over-the-counter as both the name brand Voltaren gel and the generic diclofenac gel.   Check back in 1 month

## 2024-01-07 NOTE — Progress Notes (Signed)
   Rubin Payor, PhD, LAT, ATC acting as a scribe for [name], MD.  Andrea Mason is a 73 y.o. female who presents to [facility] today for R leg pain. Pt was previously seen by Dr. Denyse Amass on 07/18/22 for lumbar radiculitis.  Today, pt c/o R legpain ongoing since yesterday morning. She fell backwards after spending the evening playing cards w/ friends. She locates pain to all over the knee joint and into the distal thigh and proximal lower leg. +numbness/tingling  She notes a hx a L LE blood clot and is currently on Eliquis.  Pain is improving today compared to yesterday.   R Knee swelling: unsure Mechanical symptoms: no Radiates: yes Aggravates: walking, worse at night Treatments tried: Tylenol  Pertinent review of systems: No fever or chills  Relevant historical information: Hx DVT on Eliquis. Hx lumbar radiculopathy.    Exam:  BP 122/86   Pulse 75   Ht 5\' 1"  (1.549 m)   Wt 169 lb (76.7 kg)   LMP  (LMP Unknown)   SpO2 99%   BMI 31.93 kg/m  General: Well Developed, well nourished, and in no acute distress.   MSK: Right knee mild effusion normal-appearing otherwise. Normal motion with crepitation. Tender palpation medial joint line. Stable ligamentous exam. Intact strength. Antalgic gait.  L-spine nontender to palpation normal lumbar motion.    Lab and Radiology Results  Procedure: Real-time Ultrasound Guided Injection of right knee joint superior lateral patella space Device: Philips Affiniti 50G/GE Logiq Images permanently stored and available for review in PACS Verbal informed consent obtained.  Discussed risks and benefits of procedure. Warned about infection, bleeding, hyperglycemia damage to structures among others. Patient expresses understanding and agreement Time-out conducted.   Noted no overlying erythema, induration, or other signs of local infection.   Skin prepped in a sterile fashion.   Local anesthesia:  Topical Ethyl chloride.   With sterile technique and under real time ultrasound guidance: 40 mg of Kenalog and 2 mL of Marcaine injected into knee joint. Fluid seen entering the joint capsule.   Completed without difficulty   Pain immediately resolved suggesting accurate placement of the medication.   Advised to call if fevers/chills, erythema, induration, drainage, or persistent bleeding.   Images permanently stored and available for review in the ultrasound unit.  Impression: Technically successful ultrasound guided injection.    X-ray images right tib-fib obtained 4 years ago interpreted personally today. 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     Assessment and Plan: 73 y.o. female with right knee pain.  There may be a lumbar radicular component but the major issue is knee pain.  Pain due to exacerbation of underlying DJD.  Plan for steroid injection today.  Recheck in 1 month.   PDMP not reviewed this encounter. Orders Placed This Encounter  Procedures   [place] LIMITED JOINT SPACE STRUCTURES LOW RIGHT(NO LINKED CHARGES)    Reason for Exam (SYMPTOM  OR DIAGNOSIS REQUIRED):   right knee pain    Preferred imaging location?:   [facility]   No orders of the defined types were placed in this encounter.    Discussed warning signs or symptoms. Please see discharge instructions. Patient expresses understanding.   The above documentation has been reviewed and is accurate and complete [name], M.D.

## 2024-01-08 ENCOUNTER — Ambulatory Visit: Payer: Medicare HMO

## 2024-01-09 ENCOUNTER — Encounter: Payer: Self-pay | Admitting: Family Medicine

## 2024-01-09 ENCOUNTER — Telehealth: Payer: Self-pay | Admitting: Family Medicine

## 2024-01-09 MED ORDER — GABAPENTIN 100 MG PO CAPS: 100.0000 mg | ORAL_CAPSULE | Freq: Three times a day (TID) | ORAL | 3 refills | Status: DC | PRN

## 2024-01-09 MED ORDER — PREDNISONE 50 MG PO TABS: 50.0000 mg | ORAL_TABLET | Freq: Every day | ORAL | 0 refills | Status: DC

## 2024-01-09 NOTE — Telephone Encounter (Signed)
 Pt called, had knee injection here on 01/07/2024. Not sure what expectations were for improvement but pt feels no better. Pain moved to back of knee and she feels like her whole leg hurts.  Pt wondering how soon she should feel improvement and if there is anything else we can do.  Also wants a work note taking her OOW from 01/08/2024-Sunday 01/13/2024.

## 2024-01-09 NOTE — Telephone Encounter (Signed)
 E-mailed address confirmed with patient, [email or web address]. Letter e-mail to pt and a copy has also been left at the front desk for pick up per Pt request.

## 2024-01-09 NOTE — Telephone Encounter (Signed)
 Called pt at [phone] and advised per Dr. Alease Hunter. Pt verbalized understanding.

## 2024-01-09 NOTE — Telephone Encounter (Signed)
 Letter written for work note and sent via [employer]. If needed we can print it so she can pick it up.   I would expect your pain to be improved by now from the knee injection.  It is possible you may have a pinched nerve in your back.  I have prescribed gabapentin  and prednisone  which may help.  Please let me know if this is not improving.

## 2024-01-15 ENCOUNTER — Ambulatory Visit: Payer: Medicare HMO | Admitting: *Deleted

## 2024-01-15 DIAGNOSIS — J4550 Severe persistent asthma, uncomplicated: Secondary | ICD-10-CM | POA: Diagnosis not present

## 2024-01-16 ENCOUNTER — Telehealth: Payer: Self-pay | Admitting: Family Medicine

## 2024-01-16 NOTE — Telephone Encounter (Signed)
Pt called, is not feeling any better. Pain is not in her knee, seems to be the back of her upper leg. Taking meds and recommendations from last visit, but does not feel improvement. Is occasionally using a walker. Needs a work note extension as she is not going back today.  Should she get imaging done? She is also mentioning that she hit her head during this fall, no pain or known injury but seems to be talking about brain fog.

## 2024-01-17 MED ORDER — PREDNISONE 50 MG PO TABS: 50.0000 mg | ORAL_TABLET | Freq: Every day | ORAL | 0 refills | Status: DC

## 2024-01-17 NOTE — Telephone Encounter (Signed)
I am prescribing prednisone.  Please take the prednisone for 5 days and schedule a follow-up appoint with me next week.

## 2024-01-17 NOTE — Telephone Encounter (Signed)
Pt returned call and was advised per Dr. Denyse Amass. F/u visit scheduled for 01/22/24.   Work note available via [employer] and will leave a copy at the front desk to cover dates 01/16/24-01/20/24.

## 2024-01-17 NOTE — Telephone Encounter (Signed)
Called pt and left VM to call the office.

## 2024-01-21 NOTE — Progress Notes (Deleted)
 Subjective:   Patient ID: Andrea Mason, female    DOB: [birth year]   MRN: [id number]  Brief patient profile:  77  yobf never smoker followed by Dr Kirke Corin for allergic rhinitis and asthma     History of Present Illness  02/05/2013 1st pulmonary eval in EPIC era baseline = completely better on qvar 80 2bid and only use rescue once a week at baseline then much worse starting 02/03/13 with cough/ congestion/ green mucus and rescue x one by time of ov at 2pm.  No resting sob, very hoarse with harsh barking cough >Augmentin rx      12/18/2017  f/u ov/Andrea Mason re:  Asthma/ chronic rhinitis / doing better following med calendar  Chief Complaint  Patient presents with   Follow-up    slight cough, red colored mucus when blowing her nose, feels like z pac and prednisone helped after her last visit   very rare saba need  Not limited by breathing from desired activities   Sleeping fine  rec 3 month f/u [name] P re med calendar/ reconciliation > did not do    06/13/2018 acute extended ov/Andrea Mason re: cough x one week > dark mucus with nasal congestion/ acute onset  Chief Complaint  Patient presents with   Acute Visit    coughing up black mucus,, sinus infection, nasal congestion   breathing is ok  Has calendar but not following contingency instructions re rx for cough with nasty sputum, has augmentin refillable thru 12/2018 rec If night time cough or reflux best medication = Pepcid 20 mg otc or get Dr Jonny Ruiz to write it.      07/26/2021  f/u ov/Andrea Mason re: rhinitis/ asthma maint on dulera 200 when ran out of symbicort   Chief Complaint  Patient presents with   Follow-up    Patient  reports that she is doing ok.     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  It would help reduce the usual side effects (diarrhea and yeast infections) if you ate cultured yogurt at lunch.  See Dr Darci Needle Rx  July 03 2022   10/06/2022  ACUTE ov/Andrea Mason re: ? ABPA/ cough to point of choking    maint on nucala   Chief Complaint  Patient presents with   Follow-up    Pt states she is coughing, wheezing and choking more than usual at night for about 2 weks. Pt states she has tried Mucinex, hard candy, inhalers and ricola throat lozenges.    Dyspnea:  custoldial work/ shopping does fine with both  Cough: x 2 weeks / light green [employer] nasal congestion esp in am Sleeping: flat bed with bunch of pillows  SABA use: twice daily hfa no neb 02: none  Rec    01/23/2024  f/u ov/Andrea Mason re: ***   maint on ***  No chief complaint on file.   Dyspnea:  *** Cough: *** Sleeping: *** resp cc  SABA use: *** 02: ***  Lung cancer screening :  ***    No obvious day to day or daytime  variability or assoc excess/ purulent sputum or mucus plugs or hemoptysis or cp or chest tightness, subjective wheeze or overt sinus or hb symptoms.    Also denies any obvious fluctuation of symptoms with weather or environmental changes or other aggravating or alleviating factors except as outlined above   No unusual exposure hx or h/o childhood pna/ asthma or knowledge of premature birth.  Current Allergies, Complete Past Medical History, Past Surgical History, Family History, and Social History were reviewed in [employer] record.  ROS  The following are not active complaints unless bolded Hoarseness, sore throat, dysphagia, dental problems, itching, sneezing,  nasal congestion or discharge of excess mucus or purulent secretions, ear ache,   fever, chills, sweats, unintended wt loss or wt gain, classically pleuritic or exertional cp,  orthopnea pnd or  arm/hand swelling  or leg swelling, presyncope, palpitations, abdominal pain, anorexia, nausea, vomiting, diarrhea  or change in bowel habits or change in bladder habits, change in stools or change in urine, dysuria, hematuria,  rash, arthralgias, visual complaints, headache, numbness, weakness or ataxia or problems with walking or coordination,  change in mood or  memory.        No outpatient medications have been marked as taking for the 01/23/24 encounter (Appointment) with Nyoka Cowden, MD.   Current Facility-Administered Medications for the 01/23/24 encounter (Appointment) with Nyoka Cowden, MD  Medication   mepolizumab (NUCALA) injection 100 mg                 Objective:    Wts  01/23/2024         ***  10/06/2022         177  07/26/2021         182   10/15/2020       191  06/13/2018         176  12/18/2017          170   07/11/16 159 lb 3.2 oz (72.2 kg)  07/11/16 160 lb (72.6 kg)  04/05/16 163 lb (73.9 kg)      Vital signs reviewed  01/23/2024  - Note at rest 02 sats  ***% on ***   General appearance:    ***                Assessment & Plan:

## 2024-01-22 ENCOUNTER — Encounter: Payer: Self-pay | Admitting: Family Medicine

## 2024-01-22 ENCOUNTER — Ambulatory Visit: Payer: Medicare HMO | Admitting: Family Medicine

## 2024-01-22 ENCOUNTER — Ambulatory Visit (INDEPENDENT_AMBULATORY_CARE_PROVIDER_SITE_OTHER): Payer: Medicare HMO

## 2024-01-22 VITALS — BP 138/74 | HR 42 | Ht 61.0 in | Wt 166.0 lb

## 2024-01-22 DIAGNOSIS — M419 Scoliosis, unspecified: Secondary | ICD-10-CM | POA: Diagnosis not present

## 2024-01-22 DIAGNOSIS — M7651 Patellar tendinitis, right knee: Secondary | ICD-10-CM | POA: Diagnosis not present

## 2024-01-22 DIAGNOSIS — M5416 Radiculopathy, lumbar region: Secondary | ICD-10-CM | POA: Diagnosis not present

## 2024-01-22 DIAGNOSIS — M4726 Other spondylosis with radiculopathy, lumbar region: Secondary | ICD-10-CM | POA: Diagnosis not present

## 2024-01-22 DIAGNOSIS — M5116 Intervertebral disc disorders with radiculopathy, lumbar region: Secondary | ICD-10-CM | POA: Diagnosis not present

## 2024-01-22 DIAGNOSIS — R29898 Other symptoms and signs involving the musculoskeletal system: Secondary | ICD-10-CM | POA: Diagnosis not present

## 2024-01-22 DIAGNOSIS — M25561 Pain in right knee: Secondary | ICD-10-CM

## 2024-01-22 DIAGNOSIS — M48061 Spinal stenosis, lumbar region without neurogenic claudication: Secondary | ICD-10-CM | POA: Diagnosis not present

## 2024-01-22 DIAGNOSIS — G8929 Other chronic pain: Secondary | ICD-10-CM | POA: Diagnosis not present

## 2024-01-22 DIAGNOSIS — M1711 Unilateral primary osteoarthritis, right knee: Secondary | ICD-10-CM | POA: Diagnosis not present

## 2024-01-22 MED ORDER — AMBULATORY NON FORMULARY MEDICATION: 0 refills | Status: DC

## 2024-01-22 NOTE — Patient Instructions (Addendum)
Thank you for coming in today.   Use gabapentin as needed.   Please get an Xray today before you leave   You should hear from MRI scheduling within 1 week. If you do not hear please let me know.    Remain out of work

## 2024-01-22 NOTE — Progress Notes (Signed)
I, [name], CMA acting as a scribe for Andrea Graham, MD.  Andrea Mason is a 73 y.o. female who presents to [facility] at [facility] today for f/u R knee pain. Pt was last seen by Dr. Denyse Amass on 01/07/24 and was given a R knee steroid injection. Pt called the office on 2/12 reporting not feeling any better and was prescribed prednisone and scheduled for an earlier f/u.  Today, pt reports continued intermittent right knee pain. Has been taking Gabapentin. Completes Prednisone dose pack today. 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 Pain is not in her knee, seems to be the back of her upper leg. Taking meds and recommendations from last visit, but does not feel improvement. Is occasionally using a walker. Has been out of work d/t pain.  Pertinent review of systems: No fevers or chills  Relevant historical information: Hypertension   Exam:  BP 138/74   Pulse (!) 42   Ht 5\' 1"  (1.549 m)   Wt 166 lb (75.3 kg)   LMP  (LMP Unknown)   SpO2 99%   BMI 31.37 kg/m  General: Well Developed, well nourished, and in no acute distress.   MSK: L-spine nontender palpation midline.  Decreased lumbar motion. Lower extremity strength is decreased right hip flexion 4/5 right knee extension 4/5. Otherwise lower extremity strength is equal and normal bilaterally.  Reflexes diminished right knee otherwise intact bilateral extremities. Sensation is intact. Positive right-sided slump test.    Lab and Radiology Results  X-ray images lumbar spine and right knee obtained today personally and independently interpreted  Lumbar spine: No level DDD.  No acute fractures are visible.  Right knee: Moderate patellofemoral mild medial DJD.  No acute fractures are visible.  Await formal radiology review     Assessment and Plan: 73 y.o. female with right leg pain.  She was seen for this similar pain recently and had a knee injection that did not help much.  She  was given oral prednisone which has been pretty helpful.  However she has weakness right knee extension and hip flexion right side which is quite concerning for lumbar radiculopathy.  Differential includes L4 or perhaps L3 lumbar radiculopathy on the right side.  Plan for x-ray and MRI lumbar spine.  Continue gabapentin.  Anticipate recheck after MRI direct referral for epidural steroid injection.  We did provide a prescription for a walker.  She has been using her friend's walker some.  PDMP not reviewed this encounter. Orders Placed This Encounter  Procedures   DG Knee AP/LAT W/Sunrise Right    Standing Status:   Future    Number of Occurrences:   1    Expiration Date:   02/19/2024    Reason for Exam (SYMPTOM  OR DIAGNOSIS REQUIRED):   right knee pain    Preferred imaging location?:   [facility]   DG Lumbar Spine 2-3 Views    Standing Status:   Future    Number of Occurrences:   1    Expiration Date:   02/19/2024    Reason for Exam (SYMPTOM  OR DIAGNOSIS REQUIRED):   low back pain    Preferred imaging location?:   [facility]   MR Lumbar Spine Wo Contrast    Standing Status:   Future    Expiration Date:   01/21/2025    What is the patient's sedation requirement?:   No Sedation    Does the patient have  a pacemaker or implanted devices?:   No    Preferred imaging location?:   [facility] (table limit-550lbs)   Meds ordered this encounter  Medications   AMBULATORY NON FORMULARY MEDICATION    Sig: Rollator walker with seat Dispense 1 Dx code: M54.16, R29.898 Use as needed    Dispense:  1 Device    Refill:  0     Discussed warning signs or symptoms. Please see discharge instructions. Patient expresses understanding.   The above documentation has been reviewed and is accurate and complete Andrea Mason, M.D.

## 2024-01-23 ENCOUNTER — Ambulatory Visit: Payer: Medicare HMO | Admitting: Internal Medicine

## 2024-01-24 ENCOUNTER — Telehealth: Payer: Self-pay | Admitting: Family Medicine

## 2024-01-24 ENCOUNTER — Encounter: Payer: Self-pay | Admitting: Gastroenterology

## 2024-01-24 NOTE — Telephone Encounter (Signed)
Left pt a VM informing her there isn't really a technical "expiration date" for the rx for a walker. Advised her there are several DME facilities here in town and provided the name of a few and advised to just google DME in [place] and she could find the one most convenient for her to get to Advised to call us back w/ any other questions or concerns.

## 2024-01-24 NOTE — Telephone Encounter (Signed)
Patient would like to know how long the prescription for a walker she received will last because she was not able to pick it up the day she was here earlier this week and she would like some more information about where to pick it up.

## 2024-01-25 ENCOUNTER — Telehealth: Payer: Self-pay | Admitting: Family Medicine

## 2024-01-25 NOTE — Telephone Encounter (Signed)
Patient called stating that she is scheduled for her MRI on March 10th but she is claustrophobic. She asked if something could be sent in for her to help during the MRI?  Pharmacy: [facility] on [address]

## 2024-01-28 MED ORDER — LORAZEPAM 0.5 MG PO TABS: ORAL_TABLET | ORAL | 0 refills | Status: DC

## 2024-01-28 NOTE — Telephone Encounter (Signed)
 Lorazepam prescribed.  Patient will need a driver for this medication with the MRI.

## 2024-01-29 ENCOUNTER — Other Ambulatory Visit: Payer: Self-pay | Admitting: Physician Assistant

## 2024-01-29 ENCOUNTER — Inpatient Hospital Stay: Payer: Medicare HMO | Attending: Physician Assistant

## 2024-01-29 ENCOUNTER — Inpatient Hospital Stay: Payer: Medicare HMO | Admitting: Physician Assistant

## 2024-01-29 VITALS — BP 133/73 | HR 66 | Temp 97.7°F | Resp 16 | Wt 168.7 lb

## 2024-01-29 DIAGNOSIS — Z86718 Personal history of other venous thrombosis and embolism: Secondary | ICD-10-CM | POA: Diagnosis not present

## 2024-01-29 DIAGNOSIS — I82402 Acute embolism and thrombosis of unspecified deep veins of left lower extremity: Secondary | ICD-10-CM

## 2024-01-29 DIAGNOSIS — Z800 Family history of malignant neoplasm of digestive organs: Secondary | ICD-10-CM | POA: Diagnosis not present

## 2024-01-29 DIAGNOSIS — Z7901 Long term (current) use of anticoagulants: Secondary | ICD-10-CM | POA: Diagnosis not present

## 2024-01-29 LAB — CBC WITH DIFFERENTIAL (CANCER CENTER ONLY)
Abs Immature Granulocytes: 0.01 10*3/uL (ref 0.00–0.07)
Basophils Absolute: 0 10*3/uL (ref 0.0–0.1)
Basophils Relative: 1 %
Eosinophils Absolute: 0.1 10*3/uL (ref 0.0–0.5)
Eosinophils Relative: 1 %
HCT: 34.9 % — ABNORMAL LOW (ref 36.0–46.0)
Hemoglobin: 11.6 g/dL — ABNORMAL LOW (ref 12.0–15.0)
Immature Granulocytes: 0 %
Lymphocytes Relative: 40 %
Lymphs Abs: 2 10*3/uL (ref 0.7–4.0)
MCH: 30.1 pg (ref 26.0–34.0)
MCHC: 33.2 g/dL (ref 30.0–36.0)
MCV: 90.6 fL (ref 80.0–100.0)
Monocytes Absolute: 0.4 10*3/uL (ref 0.1–1.0)
Monocytes Relative: 8 %
Neutro Abs: 2.5 10*3/uL (ref 1.7–7.7)
Neutrophils Relative %: 50 %
Platelet Count: 210 10*3/uL (ref 150–400)
RBC: 3.85 MIL/uL — ABNORMAL LOW (ref 3.87–5.11)
RDW: 13 % (ref 11.5–15.5)
WBC Count: 5 10*3/uL (ref 4.0–10.5)
nRBC: 0 % (ref 0.0–0.2)

## 2024-01-29 LAB — CMP (CANCER CENTER ONLY)
ALT: 11 U/L (ref 0–44)
AST: 15 U/L (ref 15–41)
Albumin: 4 g/dL (ref 3.5–5.0)
Alkaline Phosphatase: 60 U/L (ref 38–126)
Anion gap: 5 (ref 5–15)
BUN: 10 mg/dL (ref 8–23)
CO2: 29 mmol/L (ref 22–32)
Calcium: 9.6 mg/dL (ref 8.9–10.3)
Chloride: 94 mmol/L — ABNORMAL LOW (ref 98–111)
Creatinine: 1.04 mg/dL — ABNORMAL HIGH (ref 0.44–1.00)
GFR, Estimated: 57 mL/min — ABNORMAL LOW (ref >60)
Glucose, Bld: 101 mg/dL — ABNORMAL HIGH (ref 70–99)
Potassium: 4.8 mmol/L (ref 3.5–5.1)
Sodium: 128 mmol/L — ABNORMAL LOW (ref 135–145)
Total Bilirubin: 1.1 mg/dL (ref 0.0–1.2)
Total Protein: 7.1 g/dL (ref 6.5–8.1)

## 2024-01-29 NOTE — Progress Notes (Signed)
 [facility] Telephone:([phone]   [phone]  PROGRESS NOTE  Patient Care Team: Corwin Levins, MD as PCP - General Micki Riley, MD as Consulting Physician (Neurology) Szabat, Vinnie Level, [facility] (Inactive) as Pharmacist (Pharmacist)  Hematological/Oncological History 08/24/2022:Presented with left lower extremity swelling x 2-3 months. Doppler US showed acute and occlusive deep vein thrombosis involving the common femoral vein, femoral vein, popliteal vein, posterior  tibial veins, and peroneal veins.  The external iliac vein is consistent with age indeterminate and partially recanalized thrombus. Started on Eliquis therapy 09/21/2022: Doppler US: Findings consistent with age indeterminate deep vein thrombosis  involving the left common femoral vein, SF junction, left femoral vein,  left proximal profunda vein, left popliteal vein, and left peroneal veins.  12/23/2022:Doppler US:Findings consistent with chronic deep vein thrombosis involving the left  femoral vein, and left popliteal vein. When compared to prior studies,  thrombus in the common femoral, saphenofemoral junction, posterior tibial  veins, and peroneal veins appears to have resolved.  01/24/2023: Establish care with [facility] 04/24/2023: Transitioned to Eliquis 2.5 mg PO twice daily  CHIEF COMPLAINTS/PURPOSE OF CONSULTATION:  "Left lower extremity DVT"  HISTORY OF PRESENTING ILLNESS:  Andrea Mason 73 y.o. female returns for history of left lower extremity DVT. She is unaccompanied for this visit.   On exam today, Ms. Sliwa reports she is doing well and tolerating Eliquis without any new or concerning toxicities. She reports having a fall earlier this month with ongoing right knee pain. She underwent steroid injection without any improvement. She denies any signs or symptoms of a blood clot including lower extremity swelling, shortness of breath or chest pain.  She denies easy bruising or signs  of bleeding. She is otherwise feeling well. She has no other complaints. Rest of the 10 point ROS is below.   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 Annalee Genta   NASAL SINUS SURGERY  07/2006   Dr. Annalee Genta   TUBAL LIGATION      SOCIAL HISTORY: Social History   Socioeconomic History   Marital status: Widowed    Spouse name: Not on file   Number of children: 3   Years of education: Not on file   Highest education level: Not on file  Occupational History   Occupation: Museum/gallery curator    Comment: [employer]  Tobacco Use   Smoking status: Never    Passive exposure: Current (on sister's clothing bc sister is a smoker)   Smokeless tobacco: Never  Vaping Use   Vaping status: Never Used  Substance and Sexual  Activity   Alcohol use: Yes    Comment: socially wine   Drug use: No   Sexual activity: Not Currently    Birth control/protection: Post-menopausal  Other Topics Concern   Not on file  Social History Narrative   Widowed, husband died in 02/07/06   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   Patient lives alone.   Social Drivers of  Corporate investment banker Strain: Low Risk  (10/19/2023)   Overall Financial Resource Strain (CARDIA)    Difficulty of Paying Living Expenses: Not hard at all  Food Insecurity: No Food Insecurity (10/19/2023)   Hunger Vital Sign    Worried About Running Out of Food in the Last Year: Never true    Ran Out of Food in the Last Year: Never true  Transportation Needs: No Transportation Needs (10/19/2023)   PRAPARE - Administrator, Civil Service (Medical): No    Lack of Transportation (Non-Medical): No  Physical Activity: Inactive (10/19/2023)   Exercise Vital Sign    Days of Exercise per Week: 0 days    Minutes of Exercise per Session: 0 min  Stress: No Stress Concern Present (10/19/2023)   [employer] of Occupational Health - Occupational Stress Questionnaire    Feeling of Stress : Not at all  Social Connections: Moderately Integrated (10/19/2023)   Social Connection and Isolation Panel [NHANES]    Frequency of Communication with Friends and Family: More than three times a week    Frequency of Social Gatherings with Friends and Family: Twice a week    Attends Religious Services: More than 4 times per year    Active Member of [employer] or Organizations: Yes    Attends Banker Meetings: More than 4 times per year    Marital Status: Widowed  Intimate Partner Violence: Not At Risk (10/19/2023)   Humiliation, Afraid, Rape, and Kick questionnaire    Fear of Current or Ex-Partner: No    Emotionally Abused: No    Physically Abused: No    Sexually Abused: No    FAMILY HISTORY: Family History  Problem Relation Age of Onset   Asthma Mother    Allergic rhinitis Mother    Cancer Mother    Allergic rhinitis Father    Asthma Sister    Allergic rhinitis Sister    Diabetes Sister    Allergic rhinitis Brother    Colon cancer Brother    Stroke Brother    Arrhythmia Brother    Allergic rhinitis Brother    Congenital heart disease Brother    Heart attack  Brother    Lung disease Son        Sarcoid vs ARDS related fibrosis, required 2 double lung transplants   Esophageal cancer Neg Hx    Rectal cancer Neg Hx    Stomach cancer Neg Hx     ALLERGIES:  is allergic to iodine, ivp dye [iodinated contrast media], promethazine-codeine, and tramadol.  MEDICATIONS:  Current Outpatient Medications  Medication Sig Dispense Refill   acetaminophen (TYLENOL) 325 MG tablet Per bottle as needed     albuterol (VENTOLIN HFA) 108 (90 Base) MCG/ACT inhaler Inhale 1-2 puffs into the lungs every 6 (six) hours as needed for wheezing or shortness of breath. 1 each 11   alendronate (FOSAMAX) 70 MG tablet TAKE 1 TABLET(70 MG) BY MOUTH EVERY 7 DAYS WITH A FULL GLASS OF WATER AND ON AN EMPTY  STOMACH 12 tablet 3   AMBULATORY NON FORMULARY MEDICATION Rollator walker with seat Dispense 1 Dx code: M54.16, R29.898 Use as needed 1 Device 0   apixaban (ELIQUIS) 2.5 MG TABS tablet Take 1 tablet (2.5 mg total) by mouth 2 (two) times daily. 180 tablet 3   aspirin EC 81 MG tablet Take 1 tablet (81 mg total) by mouth daily. Swallow whole. 30 tablet 12   Biotin 09811 MCG TABS Take 10,000 mcg by mouth daily.     budesonide-formoterol (SYMBICORT) 80-4.5 MCG/ACT inhaler Inhale 2 puffs into the lungs 2 (two) times daily. 1 each 5   cetirizine (ZYRTEC) 10 MG tablet Take 10 mg by mouth at bedtime.      Cholecalciferol (VITAMIN D3) 2000 units TABS Take 2,000 Units by mouth daily.     clobetasol ointment (TEMOVATE) 0.05 % APPLY TO THE AFFECTED AREA TWICE DAILY FOR 2 WEEKS AND WEAN AS TOLERATED 30 g 0   Cyanocobalamin (VITAMIN B-12 PO) Take 1,000 mcg by mouth daily.     EPINEPHrine (EPIPEN 2-PAK) 0.3 mg/0.3 mL IJ SOAJ injection Inject 0.3 mg into the muscle as needed for anaphylaxis. 2 each 2   fluconazole (DIFLUCAN) 100 MG tablet Take 1 tablet (100 mg total) by mouth daily. 14 tablet 0   fluticasone (FLONASE) 50 MCG/ACT nasal spray Place 2 sprays into both nostrils daily. 16 g 5    gabapentin (NEURONTIN) 100 MG capsule Take 1-3 capsules (100-300 mg total) by mouth 3 (three) times daily as needed. 90 capsule 3   hydrocortisone cream 1 % Apply to affected area 2 times daily 15 g 0   LORazepam (ATIVAN) 0.5 MG tablet 1-2 tabs 30 - 60 min prior to MRI. Do not drive with this medicine. 4 tablet 0   Mepolizumab (NUCALA) 100 MG/ML SOAJ Inject into the skin.     montelukast (SINGULAIR) 10 MG tablet Take 1 tablet (10 mg total) by mouth at bedtime. 30 tablet 11   nystatin (MYCOSTATIN) 100000 UNIT/ML suspension Take 5 mLs (500,000 Units total) by mouth 4 (four) times daily. 180 mL 1   oxymetazoline (AFRIN) 0.05 % nasal spray Place 2 sprays into both nostrils 2 (two) times daily as needed (nasal stuffiness and nasal pressure for 5 days).      pantoprazole (PROTONIX) 40 MG tablet Take 1 tablet (40 mg total) by mouth daily. 90 tablet 3   predniSONE (DELTASONE) 50 MG tablet Take 1 tablet (50 mg total) by mouth daily. 5 tablet 0   Respiratory Therapy Supplies (FLUTTER) DEVI Use as directed     sodium chloride (OCEAN) 0.65 % SOLN nasal spray Place 2 sprays into both nostrils as needed (dry nose).      Spacer/Aero-Holding Rudean Curt by Does not apply route.     spironolactone (ALDACTONE) 25 MG tablet TAKE 1 TABLET(25 MG) BY MOUTH DAILY 90 tablet 2   Current Facility-Administered Medications  Medication Dose Route Frequency Provider Last Rate Last Admin   mepolizumab (NUCALA) injection 100 mg  100 mg Subcutaneous Q28 days Alfonse Spruce, MD   100 mg at 01/15/24 1636    REVIEW OF SYSTEMS:   Constitutional: ( - ) fevers, ( - )  chills , ( - ) night sweats Eyes: ( - ) blurriness of vision, ( - ) double vision, ( - ) watery eyes Ears, nose, mouth, throat, and face: ( - ) mucositis, ( - ) sore throat Respiratory: ( - ) cough, ( - ) dyspnea, ( - ) wheezes Cardiovascular: ( - )  palpitation, ( - ) chest discomfort, ( - ) lower extremity swelling Gastrointestinal:  ( - ) nausea, ( - )  heartburn, ( - ) change in bowel habits Skin: ( - ) abnormal skin rashes Lymphatics: ( - ) new lymphadenopathy, ( - ) easy bruising Neurological: ( - ) numbness, ( - ) tingling, ( - ) new weaknesses Behavioral/Psych: ( - ) mood change, ( - ) new changes  All other systems were reviewed with the patient and are negative.  PHYSICAL EXAMINATION: ECOG PERFORMANCE STATUS: 0 - Asymptomatic  Vitals:   01/29/24 1450  BP: 133/73  Pulse: 66  Resp: 16  Temp: 97.7 F (36.5 C)  SpO2: 100%   Filed Weights   01/29/24 1450  Weight: 168 lb 11.2 oz (76.5 kg)    GENERAL: well appearing female in NAD  SKIN: skin color, texture, turgor are normal, no rashes or significant lesions EYES: conjunctiva are pink and non-injected, sclera clear LUNGS: clear to auscultation and percussion with normal breathing effort HEART: regular rate & rhythm and no murmurs and no lower extremity edema Musculoskeletal: no cyanosis of digits and no clubbing  PSYCH: alert & oriented x 3, fluent speech NEURO: no focal motor/sensory deficits  LABORATORY DATA:  I have reviewed the data as listed    Latest Ref Rng & Units 01/29/2024    2:34 PM 01/01/2024    2:23 PM 07/31/2023   12:41 PM  CBC  WBC 4.0 - 10.5 K/uL 5.0  4.1  3.9   Hemoglobin 12.0 - 15.0 g/dL 16.1  09.6  04.5   Hematocrit 36.0 - 46.0 % 34.9  33.9  34.7   Platelets 150 - 400 K/uL 210  246.0  208        Latest Ref Rng & Units 01/29/2024    2:34 PM 01/01/2024    2:23 PM 07/31/2023   12:41 PM  CMP  Glucose 70 - 99 mg/dL 409  94  811   BUN 8 - 23 mg/dL 10  10  11    Creatinine 0.44 - 1.00 mg/dL 9.14  7.82  9.56   Sodium 135 - 145 mmol/L 128  134  139   Potassium 3.5 - 5.1 mmol/L 4.8  4.4  4.3   Chloride 98 - 111 mmol/L 94  101  105   CO2 22 - 32 mmol/L 29  27  29    Calcium 8.9 - 10.3 mg/dL 9.6  9.2  9.3   Total Protein 6.5 - 8.1 g/dL 7.1  7.2  7.1   Total Bilirubin 0.0 - 1.2 mg/dL 1.1  0.6  1.0   Alkaline Phos 38 - 126 U/L 60  77  91   AST 15 - 41 U/L  15  18  14    ALT 0 - 44 U/L 11  10  8      RADIOGRAPHIC STUDIES: I have personally reviewed the radiological images as listed and agreed with the findings in the report. [place] LIMITED JOINT SPACE STRUCTURES LOW RIGHT(NO LINKED CHARGES) Result Date: 01/10/2024 Procedure: Real-time Ultrasound Guided Injection of right knee joint superior lateral patella space Device: Philips Affiniti 50G/GE Logiq Images permanently stored and available for review in PACS Verbal informed consent obtained.  Discussed risks and benefits of procedure. Warned about infection, bleeding, hyperglycemia damage to structures among others. Patient expresses understanding and agreement Time-out conducted.  Noted no overlying erythema, induration, or other signs of local infection.  Skin prepped in a sterile fashion.  Local anesthesia: Topical Ethyl chloride.  With sterile technique and under real time ultrasound guidance: 40 mg of Kenalog and 2 mL of Marcaine injected into knee joint. Fluid seen entering the joint capsule.  Completed without difficulty  Pain immediately resolved suggesting accurate placement of the medication.  Advised to call if fevers/chills, erythema, induration, drainage, or persistent bleeding.  Images permanently stored and available for review in the ultrasound unit. Impression: Technically successful ultrasound guided injection.       ASSESSMENT & PLAN Andrea Mason is a 73 y.o. female who presents for a follow up for history of left lower extremity DVT diagnosed in September 2023.   #Unprovoked LLE DVT --findings at this time are consistent with a unprovoked VTE --Workup from 01/24/2023 showed no evidence of antiphospholipid syndrome.  --Labs today reviewed and require no intervention. WBC 5.0, Hgb 11.6, MCV 90.6, Plt 210. Creatinine 1.04, LFTs normal.  --Currently on Eliquis 2.5 mg twice daily. Recommend to continue. --patient denies any bleeding, bruising, or dark stools on this medication. It is well  tolerated. No difficulties accessing/affording the medication --RTC in 6 months' time with strict return precautions for overt signs of bleeding.    No orders of the defined types were placed in this encounter.   All questions were answered. The patient knows to call the clinic with any problems, questions or concerns.  I have spent a total of 25 minutes minutes of face-to-face and non-face-to-face time, preparing to see the patient,  performing a medically appropriate examination, counseling and educating the patient,documenting clinical information in the electronic health record,  and care coordination.   Georga Kaufmann, PA-C Department of Hematology/Oncology [facility] at [facility] Phone: [phone]

## 2024-01-30 ENCOUNTER — Telehealth: Payer: Self-pay

## 2024-01-30 DIAGNOSIS — L659 Nonscarring hair loss, unspecified: Secondary | ICD-10-CM

## 2024-01-30 NOTE — Telephone Encounter (Signed)
 Copied from CRM [id number]. Topic: Referral - Question >> Jan 30, 2024  3:42 PM Kathryne Eriksson wrote: Reason for CRM: Referral For Dermatologist (Alopecia) >> Jan 30, 2024  3:45 PM Kathryne Eriksson wrote: Patient called in stating she was once referred to a dermatologist on behalf on alopecia and would like for Dr. Oliver Barre to submit a new referral since the previous one is no longer active.

## 2024-01-31 ENCOUNTER — Other Ambulatory Visit: Payer: Self-pay | Admitting: Internal Medicine

## 2024-01-31 DIAGNOSIS — Z1231 Encounter for screening mammogram for malignant neoplasm of breast: Secondary | ICD-10-CM

## 2024-01-31 NOTE — Addendum Note (Signed)
 Addended by: Corwin Levins on: 01/31/2024 11:03 AM   Modules accepted: Orders

## 2024-01-31 NOTE — Telephone Encounter (Signed)
Ok this is done thanks

## 2024-02-01 ENCOUNTER — Other Ambulatory Visit: Payer: Self-pay | Admitting: Adult Health

## 2024-02-05 ENCOUNTER — Other Ambulatory Visit: Payer: Self-pay

## 2024-02-05 ENCOUNTER — Telehealth: Payer: Self-pay

## 2024-02-05 ENCOUNTER — Ambulatory Visit: Payer: Medicare HMO | Admitting: Family Medicine

## 2024-02-05 ENCOUNTER — Ambulatory Visit (INDEPENDENT_AMBULATORY_CARE_PROVIDER_SITE_OTHER): Payer: Medicare HMO | Admitting: Allergy & Immunology

## 2024-02-05 ENCOUNTER — Encounter: Payer: Self-pay | Admitting: Allergy & Immunology

## 2024-02-05 VITALS — BP 128/70 | HR 91 | Temp 97.9°F | Resp 16 | Ht 59.65 in | Wt 169.4 lb

## 2024-02-05 DIAGNOSIS — J3089 Other allergic rhinitis: Secondary | ICD-10-CM | POA: Diagnosis not present

## 2024-02-05 DIAGNOSIS — B4481 Allergic bronchopulmonary aspergillosis: Secondary | ICD-10-CM | POA: Diagnosis not present

## 2024-02-05 DIAGNOSIS — J302 Other seasonal allergic rhinitis: Secondary | ICD-10-CM

## 2024-02-05 DIAGNOSIS — T63421D Toxic effect of venom of ants, accidental (unintentional), subsequent encounter: Secondary | ICD-10-CM

## 2024-02-05 DIAGNOSIS — J45991 Cough variant asthma: Secondary | ICD-10-CM

## 2024-02-05 DIAGNOSIS — J4550 Severe persistent asthma, uncomplicated: Secondary | ICD-10-CM

## 2024-02-05 DIAGNOSIS — B999 Unspecified infectious disease: Secondary | ICD-10-CM | POA: Diagnosis not present

## 2024-02-05 MED ORDER — BREZTRI AEROSPHERE 160-9-4.8 MCG/ACT IN AERO: 2.0000 | INHALATION_SPRAY | Freq: Two times a day (BID) | RESPIRATORY_TRACT | 12 refills | Status: AC

## 2024-02-05 MED ORDER — MONTELUKAST SODIUM 10 MG PO TABS: 10.0000 mg | ORAL_TABLET | Freq: Every day | ORAL | 3 refills | Status: AC

## 2024-02-05 MED ORDER — IPRATROPIUM BROMIDE 0.06 % NA SOLN: 2.0000 | Freq: Three times a day (TID) | NASAL | 5 refills | Status: DC

## 2024-02-05 MED ORDER — ALBUTEROL SULFATE HFA 108 (90 BASE) MCG/ACT IN AERS: 1.0000 | INHALATION_SPRAY | Freq: Four times a day (QID) | RESPIRATORY_TRACT | 3 refills | Status: AC | PRN

## 2024-02-05 NOTE — Patient Instructions (Addendum)
 1. 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 - We will give you samples of Breztri to use twice daily. - we are going to fill out the paperwork for [place] and [place].  - Daily controller medication(s): Breztri two puffs twice daily + Nucala monthly - Prior to physical activity: albuterol 2 puffs 10-15 minutes before physical activity. - Rescue medications: albuterol 4 puffs every 4-6 hours as needed - Asthma control goals:  * Full participation in all desired activities (may need albuterol before activity) * Albuterol use two time or less a week on average (not counting use with activity) * Cough interfering with sleep two time or less a month * Oral steroids no more than once a year * No hospitalizations  2. Chronic rhinitis - with nasal polyps - Previous testing showed: grasses, ragweed, and trees - Continue with: Zyrtec (cetirizine) 10mg  tablet once daily - STOP TEMPORARILY with: Flonase (fluticasone) one spray per nostril daily (AIM FOR EAR ON EACH SIDE) - START TAKING: Atrovent (ipratropium) one spray per nostril 3 times daily for one week or so - You can use an extra dose of the antihistamine, if needed, for breakthrough symptoms.  - Consider nasal saline rinses 1-2 times daily to remove allergens from the nasal cavities as well as help with mucous clearance (this is especially helpful to do before the nasal sprays are given)  3. Recurrent infections - We are going to hold off on further workup now.  - Previous workup was largely normal.   4. Return in about 6 months (around 08/07/2024). You can have the follow up appointment with Dr. Dellis Anes or a Nurse Practicioner (our Nurse Practitioners are excellent and always have Physician oversight!).    Please inform us of any Emergency Department visits, hospitalizations, or changes in symptoms. Call us before going to the ED for breathing or allergy symptoms since we might be able to fit you in for a sick  visit. Feel free to contact us anytime with any questions, problems, or concerns.  It was a pleasure to see you again today!  Websites that have reliable patient information: 1. American Academy of Asthma, Allergy, and Immunology: www.aaaai.org 2. Food Allergy Research and Education (FARE): foodallergy.org 3. Mothers of Asthmatics: http://www.asthmacommunitynetwork.org 4. American College of Allergy, Asthma, and Immunology: www.acaai.org      "Like" [place] on Facebook and Instagram for our latest updates!      A healthy democracy works best when [employer] participate! Make sure you are registered to vote! If you have moved or changed any of your contact information, you will need to get this updated before voting! Scan the QR codes below to learn more!

## 2024-02-05 NOTE — Progress Notes (Deleted)
   Rubin Payor, PhD, LAT, ATC acting as a scribe for [name], MD.  Andrea Mason is a 73 y.o. female who presents to [facility] at [facility] today for f/u R knee pain and lumbar radiculitis. Pt was last seen by Dr. Denyse Amass on 01/22/24 and was adivsed to cont gabapentin and a L-spine MRI was ordered  Today, pt reports ***  Pertinent review of systems: ***  Relevant historical information: ***   Exam:  LMP  (LMP Unknown)  General: Well Developed, well nourished, and in no acute distress.   MSK: ***    Lab and Radiology Results No results found. However, due to the size of the patient record, not all encounters were searched. Please check Results Review for a complete set of results. No results found.     Assessment and Plan: 73 y.o. female with ***   PDMP not reviewed this encounter. No orders of the defined types were placed in this encounter.  No orders of the defined types were placed in this encounter.    Discussed warning signs or symptoms. Please see discharge instructions. Patient expresses understanding.   ***

## 2024-02-05 NOTE — Telephone Encounter (Signed)
 Left pt a VM advising that it would be wise to push back her f/u visit until after we get the MRI results back, scheduled for 3/10. If pt calls back and is agreeable, please cancel appointment.  Advised to wait until we get the MRI results back to schedule a f/u.

## 2024-02-05 NOTE — Progress Notes (Unsigned)
 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 - We will give you samples of Breztri to use twice daily. - we are going to fill out the paperwork for [place] and [place].  - Daily controller medication(s): Breztri two puffs twice daily + Nucala monthly - Prior to physical activity: albuterol 2 puffs 10-15 minutes before physical activity. - Rescue medications: albuterol 4 puffs every 4-6 hours as needed - Asthma control goals:  * Full participation in all desired activities (may need albuterol before activity) * Albuterol use two time or less a week on average (not counting use with activity) * Cough interfering with sleep two time or less a month * Oral steroids no more than once a year * No hospitalizations  2. Chronic rhinitis - with nasal polyps - Previous testing showed: grasses, ragweed, and trees - Continue with: Zyrtec (cetirizine) 10mg  tablet once daily - STOP TEMPORARILY with: Flonase (fluticasone) one spray per nostril daily (AIM FOR EAR ON EACH SIDE) - START TAKING: Atrovent (ipratropium) one spray per nostril 3 times daily for one week or so - You can use an extra dose of the antihistamine, if needed, for breakthrough symptoms.  - Consider nasal saline rinses 1-2 times daily to remove allergens from the nasal cavities as well as help  with mucous clearance (this is especially helpful to do before the nasal sprays are given)  3. Recurrent infections - We are going to hold off on further workup now.  - Previous workup was largely normal.   4. Return in about 6 months (around 08/07/2024). You can have the follow up appointment with Dr. Dellis Anes or a Nurse Practicioner (our Nurse Practitioners are excellent and always have Physician oversight!).    Please inform us of any Emergency Department visits, hospitalizations, or changes in symptoms. Call us before going to the ED for breathing or allergy symptoms since we might be able to fit you in for a sick visit. Feel free to contact us anytime with any questions, problems, or concerns.  It was a pleasure to see you again today!  Websites that have reliable patient information: 1. American Academy of Asthma, Allergy, and Immunology: www.aaaai.org 2. Food Allergy Research and Education (FARE): foodallergy.org 3. Mothers of Asthmatics: http://www.asthmacommunitynetwork.org 4. American College of Allergy, Asthma, and Immunology: www.acaai.org      "Like" [place] on Facebook and Instagram for our latest updates!      A healthy democracy works best when [employer] participate! Make sure you are registered to vote! If you have moved or changed any of your contact information, you will need to get this updated before voting! Scan the QR codes below to learn more!              Subjective:   Harshitha Fretz is a 73 y.o. female presenting today  for follow up of  Chief Complaint  Patient presents with  . Asthma  . Allergies    Alexzia Kasler has a history of the following: Patient Active Problem List   Diagnosis Date Noted  . Labium boil 12/31/2023  . Upper abdominal pain 12/31/2023  . Diarrhea 04/24/2023  . Leg DVT (deep venous thromboembolism), acute, left (HCC) 09/02/2022  . History of stroke 09/02/2022  . Lesion of right pinna 08/25/2022  . Pain and swelling of  left lower leg 08/23/2022  . Ankle edema, bilateral 08/01/2022  . Elevated serum creatinine 08/01/2022  . Bilateral hip joint arthritis 07/16/2022  . Right sided sciatica 07/14/2022  . 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  . Recurrent infections 04/13/2022  . Anemia 03/06/2022  . Thrush 04/12/2021  . Urinary frequency 09/22/2020  . Wrist pain, acute, left 09/09/2020  . CKD (chronic kidney disease) stage 3, GFR 30-59 ml/min (HCC) 12/31/2019  . Painful legs and moving toes of right foot 12/16/2019  . Skin nodule 11/16/2019  . Nocturia 11/16/2019  . Low back pain 11/16/2019  . Acute recurrent pansinusitis 02/17/2019  . Alopecia 01/31/2019  . Upper airway cough syndrome 06/14/2018  . Rash 05/30/2018  . Vitamin D deficiency 08/31/2017  . Facial contusion, subsequent encounter 03/12/2017  . Vertigo 03/12/2017  . Right rotator cuff tear 07/27/2016  . Right shoulder pain 07/11/2016  . Overactive bladder 01/19/2016  . Cough variant asthma 09/07/2015  . Burn 09/06/2015  . Vocal cord dysfunction 04/05/2015  . Asthma 04/05/2015  . Left knee pain 03/12/2015  . Right knee pain 03/12/2015  . Cough 12/08/2014  . Skin lumps, generalized 09/08/2014  . Toe pain, right 08/06/2014  . Pre-ulcerative corn or callous 08/06/2014  . Hammertoe 08/06/2014  . Swelling of joint, ankle, right 08/06/2014  . Pansinusitis 06/02/2014  . Cerebral thrombosis with cerebral infarction (HCC) 05/26/2014  . Weakness 05/25/2014  . Right arm pain 05/25/2014  . Numbness in right leg 05/25/2014  . Numbness and tingling of right arm 05/25/2014  . Embolic stroke (HCC) 03/31/2014  . 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 Alterations of sensations, late effect of cerebrovascular disease(438.6) 02/10/2014  . Cerebral infarction (HCC) 11/25/2013  . Hyperlipidemia 11/24/2013  . Syncope 11/20/2013  . 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 Back pain 11/06/2013   . Hypokalemia 05/06/2013  . Family history of colon cancer 05/06/2013  . Lipoma 09/03/2012  . Hyperglycemia 08/15/2012  . Constipation 01/02/2012  . Encounter for well adult exam with abnormal findings 05/24/2011  . Seasonal allergic rhinitis due to pollen 02/16/2011  . Spondylosis 02/09/2011  . Hypersomnia with sleep apnea 01/26/2011  . UTI (urinary tract infection) 11/15/2010  . NASAL POLYP 07/28/2010  . FIBROIDS, UTERUS 07/27/2010  . COMPUTERIZED TOMOGRAPHY, CHEST, ABNORMAL 09/16/2009  . Seasonal and perennial allergic rhinitis 05/29/2009  . PERIPHERAL EDEMA 05/28/2009  . Mild persistent asthma, uncomplicated 11/23/2008  . LEG PAIN, LEFT 06/30/2008  . History of colonic polyps 06/30/2008  . OBESITY 12/12/2007  . Depression 12/12/2007  . Essential hypertension 12/12/2007  . RHINOSINUSITIS, CHRONIC 12/12/2007  . GERD 12/12/2007  . ARTHRITIS 12/12/2007    History obtained from: chart review and {Persons; PED relatives w/patient:19415::"patient"}.  Discussed the use of AI scribe software for clinical note transcription with the patient and/or guardian, who gave verbal consent to proceed.  Deloras is a 73 y.o. female presenting for {Blank single:19197::"a food challenge","a drug challenge","skin testing","a sick visit","an evaluation of ***","a follow up visit"}.  She was  last seen in September 2024.  At that time, her lung testing looked phenomenal.  We continue with Symbicort 2 puffs twice daily and mepolizumab monthly.  She was being followed by Dr. Sherene Sires for ABPA and was supposed to be on posaconazole.  For her rhinitis with nasal polyps, she was continued on Zyrtec as well as Flonase.  Her recurrent infections, her previous workup was normal.  We did not do any further workup.  For her rash, we felt it was likely fire ant bites.  We started on a prednisone taper and added clobetasol twice daily for 1 to 2 weeks.  Since last visit,  Discussed the use of AI scribe software for  clinical note transcription with the patient, who gave verbal consent to proceed.  History of Present Illness            Asthma/Respiratory Symptom History: ***  Allergic Rhinitis Symptom History: ***  Food Allergy Symptom History: ***  Skin Symptom History: ***  GERD Symptom History: ***  Infection Symptom History: ***  Otherwise, there have been no changes to her past medical history, surgical history, family history, or social history.    Review of systems otherwise negative other than that mentioned in the HPI.    Objective:   Blood pressure 128/70, pulse 91, temperature 97.9 F (36.6 C), temperature source Temporal, resp. rate 16, height 4' 11.65" (1.515 m), weight 169 lb 6.4 oz (76.8 kg), SpO2 100%. Body mass index is 33.48 kg/m.    Physical Exam   Diagnostic studies:    Spirometry: results normal (FEV1: 1.52/89%, FVC: 2.10/97%, FEV1/FVC: 72%).    Spirometry consistent with normal pattern. {Blank single:19197::"Albuterol/Atrovent nebulizer","Xopenex/Atrovent nebulizer","Albuterol nebulizer","Albuterol four puffs via MDI","Xopenex four puffs via MDI"} treatment given in clinic with {Blank single:19197::"significant improvement in FEV1 per ATS criteria","significant improvement in FVC per ATS criteria","significant improvement in FEV1 and FVC per ATS criteria","improvement in FEV1, but not significant per ATS criteria","improvement in FVC, but not significant per ATS criteria","improvement in FEV1 and FVC, but not significant per ATS criteria","no improvement"}.  Allergy Studies: {Blank single:19197::"none","deferred due to recent antihistamine use","deferred due to insurance stipulations that require a separate visit for testing","labs sent instead"," "}    {Blank single:19197::"Allergy testing results were read and interpreted by myself, documented by clinical staff."," "}      Malachi Bonds, MD  [facility]

## 2024-02-06 ENCOUNTER — Encounter: Payer: Self-pay | Admitting: Allergy & Immunology

## 2024-02-07 ENCOUNTER — Telehealth: Payer: Self-pay

## 2024-02-07 DIAGNOSIS — Z7901 Long term (current) use of anticoagulants: Secondary | ICD-10-CM

## 2024-02-07 NOTE — Telephone Encounter (Signed)
 Copied from CRM [id number]. Topic: Clinical - Medication Question >> Feb 07, 2024 12:36 PM [place] M wrote: Reason for CRM: Patient lost her job and she is currently taking apixaban (ELIQUIS) 2.5 MG TABS tablet and it costs her $45 and she is unable to pay that. Wanting to know if there is another medication that would be the same for her to take for a lesser price

## 2024-02-08 NOTE — Addendum Note (Signed)
 Addended by: Corwin Levins on: 02/08/2024 10:20 AM   Modules accepted: Orders

## 2024-02-08 NOTE — Telephone Encounter (Signed)
 Called and let Pt know

## 2024-02-08 NOTE — Telephone Encounter (Signed)
 See below- thanks!

## 2024-02-08 NOTE — Telephone Encounter (Signed)
 Since this can be difficult. We will refer you to our office pharmacist - cristy, who can help with this    hopefully you will hear soon   thanks

## 2024-02-11 ENCOUNTER — Ambulatory Visit
Admission: RE | Admit: 2024-02-11 | Discharge: 2024-02-11 | Disposition: A | Discharge status: Home / Self Care | Payer: Medicare HMO | Source: Ambulatory Visit | Attending: Internal Medicine | Admitting: Internal Medicine

## 2024-02-11 ENCOUNTER — Telehealth: Payer: Self-pay | Admitting: *Deleted

## 2024-02-11 ENCOUNTER — Ambulatory Visit
Admission: RE | Admit: 2024-02-11 | Discharge: 2024-02-11 | Disposition: A | Discharge status: Home / Self Care | Payer: Medicare HMO | Source: Ambulatory Visit | Attending: Family Medicine | Admitting: Family Medicine

## 2024-02-11 DIAGNOSIS — M5416 Radiculopathy, lumbar region: Secondary | ICD-10-CM | POA: Diagnosis not present

## 2024-02-11 DIAGNOSIS — Z1231 Encounter for screening mammogram for malignant neoplasm of breast: Secondary | ICD-10-CM | POA: Diagnosis not present

## 2024-02-11 DIAGNOSIS — R29898 Other symptoms and signs involving the musculoskeletal system: Secondary | ICD-10-CM

## 2024-02-11 DIAGNOSIS — M48062 Spinal stenosis, lumbar region with neurogenic claudication: Secondary | ICD-10-CM | POA: Diagnosis not present

## 2024-02-11 NOTE — Progress Notes (Unsigned)
 Care Guide Pharmacy Note  02/11/2024 Name: [name] MRN: [id number] DOB: 03-Nov-1951  Referred By: Corwin Levins, MD Reason for referral: Care Coordination (Outreach to schedule referral with pharmacist )   Andrea Mason is a 73 y.o. year old female who is a primary care patient of Corwin Levins, MD.  Cheryll Keisler was referred to the pharmacist for assistance related to:  Eliquis   An unsuccessful telephone outreach was attempted today to contact the patient who was referred to the pharmacy team for assistance with medication assistance. Additional attempts will be made to contact the patient.  Burman Nieves, CMA, Care Guide [facility]  [facility], [facility] Guide Direct Dial: [phone]  Fax: [phone] Website: [email or web address]

## 2024-02-11 NOTE — Telephone Encounter (Signed)
 Pstient would like a call back regarding her medication

## 2024-02-12 ENCOUNTER — Encounter: Payer: Self-pay | Admitting: Family Medicine

## 2024-02-12 ENCOUNTER — Ambulatory Visit: Payer: Medicare HMO | Admitting: *Deleted

## 2024-02-12 DIAGNOSIS — J4550 Severe persistent asthma, uncomplicated: Secondary | ICD-10-CM | POA: Diagnosis not present

## 2024-02-12 NOTE — Progress Notes (Signed)
 Right knee x-ray shows medium arthritis and bone spurs around the kneecap.

## 2024-02-12 NOTE — Progress Notes (Signed)
 Low back x-ray shows medium arthritis changes.

## 2024-02-12 NOTE — Progress Notes (Unsigned)
 Care Guide Pharmacy Note  02/12/2024 Name: Jacquilyn Seldon MRN: [id number] DOB: 03-29-1951  Referred By: Corwin Levins, MD Reason for referral: Care Coordination (Outreach to schedule referral with pharmacist )   Arlis Everly is a 73 y.o. year old female who is a primary care patient of Corwin Levins, MD.  Nettie Wyffels was referred to the pharmacist for assistance related to:  Eliquis  A second unsuccessful telephone outreach was attempted today to contact the patient who was referred to the pharmacy team for assistance with medication assistance. Additional attempts will be made to contact the patient.  Burman Nieves, CMA, Care Guide [facility]  [facility], [facility] Guide Direct Dial: [phone]  Fax: [phone] Website: [email or web address]

## 2024-02-13 ENCOUNTER — Telehealth: Payer: Self-pay

## 2024-02-13 NOTE — Telephone Encounter (Signed)
 Copied from CRM [id number]. Topic: Clinical - Prescription Issue >> Feb 13, 2024  1:31 PM Isabell A wrote: Reason for CRM: Patient requesting to speak with Cristy in regard to her Eliquis.

## 2024-02-13 NOTE — Progress Notes (Signed)
 Care Guide Pharmacy Note  02/13/2024 Name: Koryn Charlot MRN: [id number] DOB: 1951/01/15  Referred By: Corwin Levins, MD Reason for referral: Care Coordination (Outreach to schedule referral with pharmacist )   Perla Echavarria is a 73 y.o. year old female who is a primary care patient of Corwin Levins, MD.  Sama Arauz was referred to the pharmacist for assistance related to:  medication assistance  A third unsuccessful telephone outreach was attempted today to contact the patient who was referred to the pharmacy team for assistance with medication assistance. The Population Health team is pleased to engage with this patient at any time in the future upon receipt of referral and should he/she be interested in assistance from the [facility] team.  Burman Nieves, CMA, Care Guide [facility]  [facility], [facility] Guide Direct Dial: [phone]  Fax: [phone] Website: [email or web address]

## 2024-02-14 ENCOUNTER — Other Ambulatory Visit: Payer: Self-pay | Admitting: Internal Medicine

## 2024-02-14 MED ORDER — SPIRONOLACTONE 25 MG PO TABS: ORAL_TABLET | ORAL | 1 refills | Status: DC

## 2024-02-14 NOTE — Telephone Encounter (Signed)
 Pt returned my call today - scheduled for 4/1 as pt requested afternoon appt

## 2024-02-14 NOTE — Telephone Encounter (Signed)
Pt has been scheduled with pharmacist

## 2024-02-14 NOTE — Telephone Encounter (Signed)
 Copied from CRM [id number]. Topic: Clinical - Medication Refill >> Feb 14, 2024  1:00 PM [place] J wrote: Most Recent Primary Care Visit:  Provider: [facility]  Department: [facility]  Visit Type: LAB VISIT  Date: 01/01/2024  Medication: spironolactone (ALDACTONE) 25 MG  Has the patient contacted their pharmacy? Yes (Agent: If no, request that the patient contact the pharmacy for the refill. If patient does not wish to contact the pharmacy document the reason why and proceed with request.) (Agent: If yes, when and what did the pharmacy advise?)  Is this the correct pharmacy for this prescription? Yes If no, delete pharmacy and type the correct one.  This is the patient's preferred pharmacy:  [id number] - [place], [place] - [address] AT [facility] OF [address] [place] [address] Phone: [phone] Fax: [phone]   Has the prescription been filled recently? No  Is the patient out of the medication? Yes  Has the patient been seen for an appointment in the last year OR does the patient have an upcoming appointment? Yes  Can we respond through MyChart? Yes  Agent: Please be advised that Rx refills may take up to 3 business days. We ask that you follow-up with your pharmacy.

## 2024-02-14 NOTE — Progress Notes (Signed)
 Care Guide Pharmacy Note  02/14/2024 Name: [name] MRN: [id number] DOB: 17-Mar-1951  Referred By: Corwin Levins, MD Reason for referral: Care Coordination (Outreach to schedule referral with pharmacist )   Andrea Mason is a 73 y.o. year old female who is a primary care patient of Corwin Levins, MD.  [name] was referred to the pharmacist for assistance related to:  medication assistance  Successful contact was made with the patient to discuss pharmacy services including being ready for the pharmacist to call at least 5 minutes before the scheduled appointment time and to have medication bottles and any blood pressure readings ready for review. The patient agreed to meet with the pharmacist via telephone visit on 03/04/2024  Burman Nieves, CMA, Care Guide [facility], [facility] Guide Direct Dial: [phone]  Fax: [phone] Website: [email or web address]

## 2024-03-04 ENCOUNTER — Telehealth: Payer: Self-pay | Admitting: Family Medicine

## 2024-03-04 ENCOUNTER — Other Ambulatory Visit (INDEPENDENT_AMBULATORY_CARE_PROVIDER_SITE_OTHER)

## 2024-03-04 ENCOUNTER — Encounter: Payer: Self-pay | Admitting: Family Medicine

## 2024-03-04 DIAGNOSIS — R29898 Other symptoms and signs involving the musculoskeletal system: Secondary | ICD-10-CM

## 2024-03-04 DIAGNOSIS — I10 Essential (primary) hypertension: Secondary | ICD-10-CM

## 2024-03-04 DIAGNOSIS — M5416 Radiculopathy, lumbar region: Secondary | ICD-10-CM

## 2024-03-04 NOTE — Progress Notes (Signed)
 Lumbar spine MRI shows significant areas of pinched nerves.  Will go ahead and order a back injection now which I think will help.  You should hear soon about scheduling the back injection.

## 2024-03-04 NOTE — Progress Notes (Unsigned)
   03/04/2024 Name: Andrea Mason MRN: [id number] DOB: [birth year]  Chief Complaint  Patient presents with   Eliquis Medication Access    Andrea Mason is a 73 y.o. year old female who presented for a telephone visit.   They were referred to the pharmacist by their PCP for assistance in managing medication access.    Subjective/Objective:  Care Team: Primary Care Provider: Corwin Levins, MD   Medication Access/Adherence  Current Pharmacy:  [id number] - [place], [place] - [address] AT [facility] [address] & [address] [facility] [address] Phone: [phone] Fax: [phone]   Patient reports affordability concerns with their medications: Yes  Patient reports access/transportation concerns to their pharmacy: No  Patient reports adherence concerns with their medications:  Yes    Pt has not been taking Eliquis because she is unable to afford the copay ($45 per 30 DS) She is prescribed Eliquis for DVT prophylaxis. Hx of unprovoked DVT in 2023.   Assessment/Plan:  Medication access: There are currently no options for Eliquis assistance for medicare patients.  Pt may be eligible to get LIS to reduce her medication costs.  Submitted application for LIS to [email or web address].gov on patient's behalf with her on the telephone Takes about 4 weeks, pt has to wait for response in the mail for SSA  Follow Up Plan: PRN   Arbutus Leas, PharmD, BCPS, CPP Clinical Pharmacist Practitioner [facility] [phone]

## 2024-03-07 ENCOUNTER — Ambulatory Visit: Payer: Medicare HMO | Admitting: Gastroenterology

## 2024-03-07 ENCOUNTER — Encounter: Payer: Self-pay | Admitting: Gastroenterology

## 2024-03-07 VITALS — BP 122/68 | HR 79 | Ht 59.0 in | Wt 172.0 lb

## 2024-03-07 DIAGNOSIS — Z800 Family history of malignant neoplasm of digestive organs: Secondary | ICD-10-CM

## 2024-03-07 DIAGNOSIS — Z8719 Personal history of other diseases of the digestive system: Secondary | ICD-10-CM | POA: Diagnosis not present

## 2024-03-07 DIAGNOSIS — K449 Diaphragmatic hernia without obstruction or gangrene: Secondary | ICD-10-CM | POA: Diagnosis not present

## 2024-03-07 DIAGNOSIS — R142 Eructation: Secondary | ICD-10-CM

## 2024-03-07 DIAGNOSIS — R140 Abdominal distension (gaseous): Secondary | ICD-10-CM | POA: Diagnosis not present

## 2024-03-07 DIAGNOSIS — R1013 Epigastric pain: Secondary | ICD-10-CM | POA: Diagnosis not present

## 2024-03-07 DIAGNOSIS — K219 Gastro-esophageal reflux disease without esophagitis: Secondary | ICD-10-CM

## 2024-03-07 DIAGNOSIS — Z860100 Personal history of colon polyps, unspecified: Secondary | ICD-10-CM

## 2024-03-07 MED ORDER — PANTOPRAZOLE SODIUM 40 MG PO TBEC: 40.0000 mg | DELAYED_RELEASE_TABLET | Freq: Two times a day (BID) | ORAL | 2 refills | Status: DC

## 2024-03-07 NOTE — Progress Notes (Signed)
 Chief Complaint: Indigestion, bloating, eructation Primary GI MD: Dr. 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  History of Present Illness  She has been experiencing persistent indigestion and gas for a long time, with symptoms ongoing despite treatment. An endoscopy in 2021 revealed a hiatal hernia, and at that time, she had similar symptoms, including indigestion, heartburn, and bloating.  She is currently taking pantoprazole 40 mg once daily, which she believes helps, although she still experiences breakthrough symptoms most of the time. She describes having both burping and flatulence, with flatulence being less frequent than before. Indigestion occurs daily, and she cannot recall a time when she did not have it. She has adapted by eating slowly and chewing her food thoroughly.  Her last colonoscopy was in July 2020, and she is aware of a family history of colon cancer, as her oldest brother had it.   PREVIOUS GI WORKUP   EGD 02/2020 for persistent GERD - Esophagogastric landmarks identified.  - 1 cm hiatal hernia.  - Widely patient mild Shatski ring  - A single gastric polyp at the cardia as described above. Resected and retrieved.  - Multiple gastric polyps, benign appearing.  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 Biopsied to ensure no adenomatous changes - Normal duodenum otherwise.  Diagnosis 1. Surgical [P], duodenal bulb - HETEROTOPIC GASTRIC MUCOSA. - NO DYSPLASIA OR MALIGNANCY. 2. Surgical [P], stomach, cardia, polyp - FUNDIC GLAND POLYP(S). - WARTHIN-STARRY IS NEGATIVE FOR HELICOBACTER PYLORI. - NO INTESTINAL METAPLASIA, DYSPLASIA, OR MALIGNANCY.  Colonoscopy 07/02/19 - The perianal and digital rectal examinations were  normal. - A 3 mm polyp was found in the cecum. The polyp was flat. The polyp was removed with a cold snare. Resection and retrieval were complete. - Three sessile polyps were found in the transverse colon. The polyps were 2 to 6 mm in size. These polyps were removed with a cold snare. Resection and retrieval were complete. - The colon was tortuous. - The exam was otherwise without abnormality.   2 of 4 polyps adenomas Repeat in 5 years given FH of colon cancer  Past Medical History:  Diagnosis Date   Allergic rhinitis    Allergy    Asthma    Depression    DJD (degenerative joint disease), cervical    DVT (deep venous thrombosis) (HCC)    GERD (gastroesophageal reflux disease)    History of colonic polyps    Hypertension    Obesity    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   Stroke [facility])    Syncope     Past Surgical History:  Procedure Laterality Date   COLONOSCOPY     FOOT SURGERY  1998   Implantable loop recorder placement  09/16/14   MDT LINQ implanted by Dr Johney Frame for cryptogenic stroke, RIO II protocol   LOOP RECORDER REMOVAL N/A 10/02/2017   Procedure: LOOP RECORDER REMOVAL;  Surgeon: Hillis Range, MD;  Location: [facility];  Service: Cardiovascular;  Laterality: N/A;   NASAL POLYP SURGERY  07/2006   x 2 with Dr. Annalee Genta   NASAL SINUS SURGERY  07/2006   Dr. Annalee Genta   TUBAL LIGATION      Current Outpatient Medications  Medication Sig Dispense Refill   acetaminophen (TYLENOL) 325 MG tablet Per bottle as needed  albuterol (VENTOLIN HFA) 108 (90 Base) MCG/ACT inhaler Inhale 1-2 puffs into the lungs every 6 (six) hours as needed for wheezing or shortness of breath. 1 each 3   alendronate (FOSAMAX) 70 MG tablet TAKE 1 TABLET(70 MG) BY MOUTH EVERY 7 DAYS WITH A FULL GLASS OF WATER AND ON AN EMPTY STOMACH 12 tablet 3   AMBULATORY NON FORMULARY MEDICATION Rollator walker with seat Dispense 1 Dx code: M54.16, R29.898 Use as needed 1 Device 0   aspirin  EC 81 MG tablet Take 1 tablet (81 mg total) by mouth daily. Swallow whole. 30 tablet 12   Biotin 16109 MCG TABS Take 10,000 mcg by mouth daily.     cetirizine (ZYRTEC) 10 MG tablet Take 10 mg by mouth at bedtime.      Cholecalciferol (VITAMIN D3) 2000 units TABS Take 2,000 Units by mouth daily.     Cyanocobalamin (VITAMIN B-12 PO) Take 1,000 mcg by mouth daily.     gabapentin (NEURONTIN) 100 MG capsule Take 1-3 capsules (100-300 mg total) by mouth 3 (three) times daily as needed. 90 capsule 3   ipratropium (ATROVENT) 0.06 % nasal spray Place 2 sprays into both nostrils 3 (three) times daily. 15 mL 5   Mepolizumab (NUCALA) 100 MG/ML SOAJ Inject into the skin.     montelukast (SINGULAIR) 10 MG tablet Take 1 tablet (10 mg total) by mouth at bedtime. 90 tablet 3   Respiratory Therapy Supplies (FLUTTER) DEVI Use as directed     sodium chloride (OCEAN) 0.65 % SOLN nasal spray Place 2 sprays into both nostrils as needed (dry nose).      Spacer/Aero-Holding Rudean Curt by Does not apply route.     spironolactone (ALDACTONE) 25 MG tablet TAKE 1 TABLET(25 MG) BY MOUTH DAILY 90 tablet 1   apixaban (ELIQUIS) 2.5 MG TABS tablet Take 1 tablet (2.5 mg total) by mouth 2 (two) times daily. (Patient not taking: Reported on 03/07/2024) 180 tablet 3   fluconazole (DIFLUCAN) 100 MG tablet Take 1 tablet (100 mg total) by mouth daily. 14 tablet 0   LORazepam (ATIVAN) 0.5 MG tablet 1-2 tabs 30 - 60 min prior to MRI. Do not drive with this medicine. 4 tablet 0   pantoprazole (PROTONIX) 40 MG tablet Take 1 tablet (40 mg total) by mouth 2 (two) times daily. 60 tablet 2   Current Facility-Administered Medications  Medication Dose Route Frequency Provider Last Rate Last Admin   mepolizumab (NUCALA) injection 100 mg  100 mg Subcutaneous Q28 days Alfonse Spruce, MD   100 mg at 02/12/24 1706    Allergies as of 03/07/2024 - Review Complete 03/07/2024  Allergen Reaction Noted   Iodine Nausea And Vomiting and Rash  12/21/2012   Ivp dye [iodinated contrast media] Nausea And Vomiting and Other (See Comments) 12/21/2012   Promethazine-codeine Nausea And Vomiting 11/15/2010   Tramadol Other (See Comments) 03/31/2014    Family History  Problem Relation Age of Onset   Asthma Mother    Allergic rhinitis Mother    Cancer Mother    Allergic rhinitis Father    Asthma Sister    Allergic rhinitis Sister    Diabetes Sister    Allergic rhinitis Brother    Colon cancer Brother    Stroke Brother    Arrhythmia Brother    Allergic rhinitis Brother    Congenital heart disease Brother    Heart attack Brother    Lung disease Son        Sarcoid vs ARDS related  fibrosis, required 2 double lung transplants   Esophageal cancer Neg Hx    Rectal cancer Neg Hx    Stomach cancer Neg Hx     Social History   Socioeconomic History   Marital status: Widowed    Spouse name: Not on file   Number of children: 3   Years of education: Not on file   Highest education level: Not on file  Occupational History   Occupation: Museum/gallery curator    Comment: [employer]   Occupation: direct Patent examiner  Tobacco Use   Smoking status: Never    Passive exposure: Current (on sister's clothing bc sister is a smoker)   Smokeless tobacco: Never  Vaping Use   Vaping status: Never Used  Substance and Sexual Activity   Alcohol use: Yes    Comment: socially wine   Drug use: No   Sexual activity: Not Currently    Birth control/protection: Post-menopausal  Other Topics Concern   Not on file  Social History Narrative   Widowed, husband died in Apr 07, 2006   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   Patient lives alone.   Social Drivers of Corporate investment banker Strain: Low Risk  (10/19/2023)   Overall Financial Resource Strain (CARDIA)    Difficulty of Paying Living Expenses: Not hard at all  Food Insecurity: No Food Insecurity (10/19/2023)   Hunger Vital Sign     Worried About Running Out of Food in the Last Year: Never true    Ran Out of Food in the Last Year: Never true  Transportation Needs: No Transportation Needs (10/19/2023)   PRAPARE - Administrator, Civil Service (Medical): No    Lack of Transportation (Non-Medical): No  Physical Activity: Inactive (10/19/2023)   Exercise Vital Sign    Days of Exercise per Week: 0 days    Minutes of Exercise per Session: 0 min  Stress: No Stress Concern Present (10/19/2023)   [employer] of Occupational Health - Occupational Stress Questionnaire    Feeling of Stress : Not at all  Social Connections: Moderately Integrated (10/19/2023)   Social Connection and Isolation Panel [NHANES]    Frequency of Communication with Friends and Family: More than three times a week    Frequency of Social Gatherings with Friends and Family: Twice a week    Attends Religious Services: More than 4 times per year    Active Member of [employer] or Organizations: Yes    Attends Banker Meetings: More than 4 times per year    Marital Status: Widowed  Intimate Partner Violence: Not At Risk (10/19/2023)   Humiliation, Afraid, Rape, and Kick questionnaire    Fear of Current or Ex-Partner: No    Emotionally Abused: No    Physically Abused: No    Sexually Abused: No    Review of Systems:    Constitutional: No weight loss, fever, chills, weakness or fatigue HEENT: Eyes: No change in vision               Ears, Nose, Throat:  No change in hearing or congestion Skin: No rash or itching Cardiovascular: No chest pain, chest pressure or palpitations   Respiratory: No SOB or cough Gastrointestinal: See HPI and otherwise negative Genitourinary: No dysuria or change in urinary frequency Neurological: No headache, dizziness or syncope Musculoskeletal: No new muscle or joint pain Hematologic: No bleeding or bruising Psychiatric: No history of  depression or anxiety    Physical Exam:  Vital signs: BP  122/68   Pulse 79   Ht 4\' 11"  (1.499 m)   Wt 172 lb (78 kg)   LMP  (LMP Unknown)   BMI 34.74 kg/m   Constitutional: NAD, Well developed, Well nourished, alert and cooperative Head:  Normocephalic and atraumatic. Eyes:   PEERL, EOMI. No icterus. Conjunctiva pink. Respiratory: Respirations even and unlabored. Lungs clear to auscultation bilaterally.   No wheezes, crackles, or rhonchi.  Cardiovascular:  Regular rate and rhythm. No peripheral edema, cyanosis or pallor.  Gastrointestinal:  Soft, nondistended, nontender. No rebound or guarding. Normal bowel sounds. No appreciable masses or hepatomegaly. Rectal:  Not performed.  Msk:  Symmetrical without gross deformities. Without edema, no deformity or joint abnormality.  Neurologic:  Alert and  oriented x4;  grossly normal neurologically.  Skin:   Dry and intact without significant lesions or rashes. Psychiatric: Oriented to person, place and time. Demonstrates good judgement and reason without abnormal affect or behaviors.   RELEVANT LABS AND IMAGING: CBC    Component Value Date/Time   WBC 5.0 01/29/2024 1434   WBC 4.1 01/01/2024 1423   RBC 3.85 (L) 01/29/2024 1434   HGB 11.6 (L) 01/29/2024 1434   HGB 13.2 04/11/2022 1628   HCT 34.9 (L) 01/29/2024 1434   HCT 36.4 04/11/2022 1628   PLT 210 01/29/2024 1434   MCV 90.6 01/29/2024 1434   MCV 89 04/11/2022 1628   MCH 30.1 01/29/2024 1434   MCHC 33.2 01/29/2024 1434   RDW 13.0 01/29/2024 1434   RDW 11.8 04/11/2022 1628   LYMPHSABS 2.0 01/29/2024 1434   LYMPHSABS 2.1 04/11/2022 1628   MONOABS 0.4 01/29/2024 1434   EOSABS 0.1 01/29/2024 1434   EOSABS 0.7 (H) 04/11/2022 1628   BASOSABS 0.0 01/29/2024 1434   BASOSABS 0.1 04/11/2022 1628    CMP     Component Value Date/Time   NA 128 (L) 01/29/2024 1434   K 4.8 01/29/2024 1434   CL 94 (L) 01/29/2024 1434   CO2 29 01/29/2024 1434   GLUCOSE 101 (H) 01/29/2024 1434   BUN 10 01/29/2024 1434   CREATININE 1.04 (H) 01/29/2024 1434    CREATININE 1.09 (H) 09/09/2020 0938   CALCIUM 9.6 01/29/2024 1434   PROT 7.1 01/29/2024 1434   ALBUMIN 4.0 01/29/2024 1434   AST 15 01/29/2024 1434   ALT 11 01/29/2024 1434   ALKPHOS 60 01/29/2024 1434   BILITOT 1.1 01/29/2024 1434   GFRNONAA 57 (L) 01/29/2024 1434   GFRNONAA 52 (L) 09/09/2020 0938   GFRAA 61 09/09/2020 0938     Assessment/Plan:    Assessment & Plan  Dyspepsia Hiatal hernia Chronic dyspeptic symptoms despite pantoprazole 40 Mg once daily.  EGD in 2021 for same symptoms showed fundic gland polyps and 1 cm hiatal hernia. Characterized by indigestion, eructation, and bloating. - Increase pantoprazole to twice daily - Provide a low FODMAP diet handout.   - Discuss probiotics for bloating and gas - Educated patient on lifestyle modifications and provided patient education handouts - Schedule follow-up in 8 weeks to reassess symptoms and consider endoscopy if symptoms persist.    Colonoscopy Surveillance   Due for colonoscopy in July 2025 due to family history of colon cancer.   - Schedule colonoscopy for July 2025, we will do this at follow-up visit.  If no response to the above we will add on EGD as well.    Boone Master, PA-C [facility] 03/07/2024, 3:22 PM  Cc: Corwin Levins, MD

## 2024-03-07 NOTE — Patient Instructions (Signed)
 Increase Pantoprazole to twice daily.   _______________________________________________________  If your blood pressure at your visit was 140/90 or greater, please contact your primary care physician to follow up on this.  _______________________________________________________  If you are age 71 or older, your body mass index should be between 23-30. Your Body mass index is 34.74 kg/m. If this is out of the aforementioned range listed, please consider follow up with your Primary Care Provider.  If you are age 54 or younger, your body mass index should be between 19-25. Your Body mass index is 34.74 kg/m. If this is out of the aformentioned range listed, please consider follow up with your Primary Care Provider.   ________________________________________________________  The [facility] providers would like to encourage you to use [facility] to communicate with providers for non-urgent requests or questions.  Due to long hold times on the telephone, sending your provider a message by [facility] may be a faster and more efficient way to get a response.  Please allow 48 business hours for a response.  Please remember that this is for non-urgent requests.  _______________________________________________________  Thank you for choosing me and [facility].

## 2024-03-10 NOTE — Progress Notes (Signed)
 Agree with assessment and plan as outlined.

## 2024-03-11 ENCOUNTER — Ambulatory Visit (INDEPENDENT_AMBULATORY_CARE_PROVIDER_SITE_OTHER): Payer: Medicare HMO | Admitting: Obstetrics and Gynecology

## 2024-03-11 ENCOUNTER — Ambulatory Visit

## 2024-03-11 ENCOUNTER — Encounter: Payer: Self-pay | Admitting: Obstetrics and Gynecology

## 2024-03-11 VITALS — BP 130/84 | HR 78

## 2024-03-11 DIAGNOSIS — N3281 Overactive bladder: Secondary | ICD-10-CM | POA: Diagnosis not present

## 2024-03-11 DIAGNOSIS — Z202 Contact with and (suspected) exposure to infections with a predominantly sexual mode of transmission: Secondary | ICD-10-CM | POA: Diagnosis not present

## 2024-03-11 DIAGNOSIS — J4550 Severe persistent asthma, uncomplicated: Secondary | ICD-10-CM | POA: Diagnosis not present

## 2024-03-11 DIAGNOSIS — Z1211 Encounter for screening for malignant neoplasm of colon: Secondary | ICD-10-CM

## 2024-03-11 DIAGNOSIS — L0292 Furuncle, unspecified: Secondary | ICD-10-CM | POA: Diagnosis not present

## 2024-03-11 NOTE — Progress Notes (Signed)
   Acute Office Visit New patient  Subjective:    Patient ID: Andrea Mason, female    DOB: [birth year], 73 y.o.   MRN: [id number]   HPI 73 y.o. presents today for Office Visit (Patient complains of bump on top of vagina but normally the bumps come up on the labia more towards the vaginal hole. Has been noticing the bumps coming and going over the years. This past year she has noticed them more than normal and has already antibiotics before but they did not work in the past. ) . Reports having a partner in the past and she is worried about STI's from him.  No LMP recorded (lmp unknown). Patient is postmenopausal.   2024 dxa osteopenia 02/11/24 [facility] birads 1 neg 2020 colonoscopy: recommends repeat in 5 years.  Referral placed  Review of Systems     Objective:    OBGyn Exam  BP 130/84 (BP Location: Left Arm, Patient Position: Sitting, Cuff Size: Normal)   Pulse 78   LMP  (LMP Unknown)   SpO2 96%  Wt Readings from Last 3 Encounters:  03/07/24 172 lb (78 kg)  02/05/24 169 lb 6.4 oz (76.8 kg)  01/29/24 168 lb 11.2 oz (76.5 kg)      Resolving likely boil on mons. No expending erythema or evidence of infection Cultures collected.    Mel Almond, CMA present during entire exam  Assessment & Plan:  Warm soapy compresses twice daily and neosporin twice daily for 7 days Mrsa nasal swab collected Culture of the lesion sent. Vaginal nu swab sent Patient with complaints of OAB: referral to urogyn placed Referral for colonoscopy placed   Earley Favor

## 2024-03-12 ENCOUNTER — Encounter: Payer: Self-pay | Admitting: Obstetrics and Gynecology

## 2024-03-12 LAB — SURESWAB® ADVANCED VAGINITIS PLUS,TMA
C. trachomatis RNA, TMA: NOT DETECTED
CANDIDA SPECIES: NOT DETECTED
Candida glabrata: NOT DETECTED
N. gonorrhoeae RNA, TMA: NOT DETECTED
SURESWAB(R) ADV BACTERIAL VAGINOSIS(BV),TMA: NEGATIVE
TRICHOMONAS VAGINALIS (TV),TMA: NOT DETECTED

## 2024-03-14 LAB — MRSA CULTURE
MICRO NUMBER:: 16302971
SPECIMEN QUALITY:: ADEQUATE

## 2024-03-16 LAB — ANAEROBIC AND AEROBIC CULTURE
MICRO NUMBER:: 16302970
SPECIMEN QUALITY:: ADEQUATE

## 2024-03-18 ENCOUNTER — Encounter: Payer: Self-pay | Admitting: Family Medicine

## 2024-03-21 NOTE — Discharge Instructions (Signed)

## 2024-03-22 ENCOUNTER — Ambulatory Visit (INDEPENDENT_AMBULATORY_CARE_PROVIDER_SITE_OTHER): Payer: Self-pay

## 2024-03-22 ENCOUNTER — Encounter: Payer: Self-pay | Admitting: *Deleted

## 2024-03-22 ENCOUNTER — Ambulatory Visit
Admission: EM | Admit: 2024-03-22 | Discharge: 2024-03-22 | Disposition: A | Discharge status: Home / Self Care | Payer: Self-pay | Attending: Family Medicine | Admitting: Family Medicine

## 2024-03-22 ENCOUNTER — Other Ambulatory Visit: Payer: Self-pay

## 2024-03-22 DIAGNOSIS — M4802 Spinal stenosis, cervical region: Secondary | ICD-10-CM | POA: Diagnosis not present

## 2024-03-22 DIAGNOSIS — S199XXA Unspecified injury of neck, initial encounter: Secondary | ICD-10-CM | POA: Diagnosis not present

## 2024-03-22 DIAGNOSIS — M542 Cervicalgia: Secondary | ICD-10-CM

## 2024-03-22 DIAGNOSIS — M79641 Pain in right hand: Secondary | ICD-10-CM

## 2024-03-22 DIAGNOSIS — M19041 Primary osteoarthritis, right hand: Secondary | ICD-10-CM | POA: Diagnosis not present

## 2024-03-22 DIAGNOSIS — M50323 Other cervical disc degeneration at C6-C7 level: Secondary | ICD-10-CM | POA: Diagnosis not present

## 2024-03-22 MED ORDER — MELOXICAM 15 MG PO TABS: 15.0000 mg | ORAL_TABLET | Freq: Every day | ORAL | 0 refills | Status: DC
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
## 2024-03-24 ENCOUNTER — Ambulatory Visit
Admission: RE | Admit: 2024-03-24 | Discharge: 2024-03-24 | Disposition: A | Discharge status: Home / Self Care | Source: Ambulatory Visit | Attending: Family Medicine | Admitting: Family Medicine

## 2024-03-24 DIAGNOSIS — R29898 Other symptoms and signs involving the musculoskeletal system: Secondary | ICD-10-CM

## 2024-03-24 DIAGNOSIS — M4727 Other spondylosis with radiculopathy, lumbosacral region: Secondary | ICD-10-CM | POA: Diagnosis not present

## 2024-03-24 DIAGNOSIS — M5416 Radiculopathy, lumbar region: Secondary | ICD-10-CM

## 2024-03-24 MED ORDER — METHYLPREDNISOLONE ACETATE 40 MG/ML INJ SUSP (RADIOLOG
80.0000 mg | Freq: Once | INTRAMUSCULAR | Status: AC
  Administered 2024-03-24: 80 mg via EPIDURAL

## 2024-03-24 MED ORDER — IOPAMIDOL (ISOVUE-M 200) INJECTION 41%
1.0000 mL | Freq: Once | INTRAMUSCULAR | Status: AC
  Administered 2024-03-24: 1 mL via EPIDURAL
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
## 2024-04-09 ENCOUNTER — Ambulatory Visit

## 2024-04-09 DIAGNOSIS — J4550 Severe persistent asthma, uncomplicated: Secondary | ICD-10-CM | POA: Diagnosis not present

## 2024-04-13 NOTE — Progress Notes (Unsigned)
 Subjective:   Patient ID: Andrea Mason, female    DOB: [birth year]   MRN: [id number]  Brief patient profile:  11 yobf never smoker followed by Dr Andrea Mason for allergic rhinitis and asthma     History of Present Illness  02/05/2013 1st pulmonary eval in EPIC era baseline = completely better on qvar  80 2bid and only use rescue once a week at baseline then much worse starting 02/03/13 with cough/ congestion/ green mucus and rescue x one by time of ov at 2pm.  No resting sob, very hoarse with harsh barking cough >Augmentin  rx   07/26/2021  f/u ov/Andrea Mason re: rhinitis/ asthma maint on dulera  200 when ran out of symbicort    Chief Complaint  Patient presents with   Follow-up    Patient  reports that she is doing ok.    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  It would help reduce the usual side effects (diarrhea and yeast infections) if you ate cultured yogurt at lunch.  See Dr Andrea Mason   Nucala  Rx  July 03 2022   10/06/2022  ACUTE ov/Andrea Mason re: ? ABPA/ cough to point of choking    maint on nucala    Chief Complaint  Patient presents with   Follow-up    Pt states she is coughing, wheezing and choking more than usual at night for about 2 weks. Pt states she has tried Mucinex , hard candy, inhalers and ricola throat lozenges.    Dyspnea:  custoldial work/ shopping does fine with both  Cough: x 2 weeks / light green assoc nasal congestion esp in am Sleeping: flat bed with bunch of pillows  SABA use: twice daily hfa no neb 02: none  Rec Augmentin  875 mg take one pill twice daily  X 10 days   Prednisone  10 mg take  4 each am x 2 days,   2 each  am x 2 days,  1 each am x 2 days and stop  See calendar for specific medication instructions      04/14/2024  f/u ov/Andrea Mason re: ? ABPA/ cough variant asthma   maint on breztri  / nucala  shots have helped per pt   Chief Complaint  Patient presents with   Follow-up    Breathing is overall doing well. She has been off Eliquis  due to cost since Feb 2025.   Dyspnea:  limited by back and leg pain, not sob  Cough: none  Sleeping: bed is flat with bunch of pillows s resp cc  SABA use: none  02: none     No obvious day to day or daytime variability or assoc excess/ purulent sputum or mucus plugs or hemoptysis or cp or chest tightness, subjective wheeze or overt sinus or hb symptoms.    Also denies any obvious fluctuation of symptoms with weather or environmental changes or other aggravating or alleviating factors except as outlined above   No unusual exposure hx or h/o childhood pna/ asthma or knowledge of premature birth.  Current Allergies, Complete Past Medical History, Past Surgical History, Family History, and Social History were reviewed  in [employer] record.  ROS  The following are not active complaints unless bolded Hoarseness, sore throat, dysphagia, dental problems, itching, sneezing,  nasal congestion or discharge of excess mucus or purulent secretions, ear ache,   fever, chills, sweats, unintended wt loss or wt gain, classically pleuritic or exertional cp,  orthopnea pnd or arm/hand swelling  or leg swelling, presyncope, palpitations, abdominal pain, anorexia, nausea, vomiting, diarrhea  or change in bowel habits or change in bladder habits, change in stools or change in urine, dysuria, hematuria,  rash, arthralgias, visual complaints, headache, numbness, weakness or ataxia or problems with walking or coordination,  change in mood or  memory.        Current Meds  Medication Sig   acetaminophen  (TYLENOL ) 325 MG tablet Per bottle as needed   albuterol  (VENTOLIN   HFA) 108 (90 Base) MCG/ACT inhaler Inhale 1-2 puffs into the lungs every 6 (six) hours as needed for wheezing or shortness of breath.   alendronate  (FOSAMAX ) 70 MG tablet TAKE 1 TABLET(70 MG) BY MOUTH EVERY 7 DAYS WITH A FULL GLASS OF WATER AND ON AN EMPTY STOMACH   AMBULATORY NON FORMULARY MEDICATION Rollator walker with seat Dispense 1 Dx code: M54.16, R29.898 Use as needed   aspirin  EC 81 MG tablet Take 1 tablet (81 mg total) by mouth daily. Swallow whole.   Biotin 29562 MCG TABS Take 10,000 mcg by mouth daily.   budeson-glycopyrrolate-formoterol  (BREZTRI  AEROSPHERE) 160-9-4.8 MCG/ACT AERO inhaler Inhale 2 puffs into the lungs 2 (two) times daily.   cetirizine  (ZYRTEC ) 10 MG tablet Take 10 mg by mouth at bedtime.    Cholecalciferol (VITAMIN D3) 2000 units TABS Take 2,000 Units by mouth daily.   Cyanocobalamin  (VITAMIN Mason-12 PO) Take 1,000 mcg by mouth daily.   gabapentin  (NEURONTIN ) 100 MG capsule Take 1-3 capsules (100-300 mg total) by mouth 3 (three) times daily as needed.   ipratropium (ATROVENT ) 0.06 % nasal spray Place 2 sprays into both nostrils 3 (three) times daily.   Mepolizumab  (NUCALA ) 100 MG/ML SOAJ Inject into the skin.   montelukast  (SINGULAIR ) 10 MG tablet Take 1 tablet (10 mg total) by mouth at bedtime.   pantoprazole  (PROTONIX ) 40 MG tablet Take 1 tablet (40 mg total) by mouth 2 (two) times daily.   Respiratory Therapy Supplies (FLUTTER) DEVI Use as directed   sodium chloride  (OCEAN) 0.65 % SOLN nasal spray Place 2 sprays into both nostrils as needed (dry nose).    Spacer/Aero-Holding Ismael Maria by Does not apply route.   spironolactone  (ALDACTONE ) 25 MG tablet TAKE 1 TABLET(25 MG) BY MOUTH DAILY   Current Facility-Administered Medications for the 04/14/24 encounter (Office Visit) with Andrea Riordan B, MD  Medication   mepolizumab  (NUCALA ) injection 100 mg              Objective:    Wts  04/14/2024        175  10/06/2022         177  07/26/2021         182    10/15/2020       191  06/13/2018         176  12/18/2017          170   07/11/16 159 lb 3.2 oz (72.2 kg)  07/11/16 160 lb (72.6 kg)  04/05/16 163 lb (73.9 kg)    Vital signs reviewed  04/14/2024  - Note at rest 02 sats  100% on RA   General appearance:  amb bf nad   HEENT : Oropharynx  clear     Nasal turbinates nl    NECK :  without  apparent JVD/ palpable Nodes/TM    LUNGS: no acc muscle use,  Nl contour chest which is clear to A and P bilaterally without cough on insp or exp maneuvers   CV:  RRR  no s3 or murmur or increase in P2, and no edema   ABD:  soft and nontender   MS:  Gait nl   ext warm without deformities Or obvious joint restrictions  calf tenderness, cyanosis or clubbing    SKIN: warm and dry without lesions    NEURO:  alert, approp, nl sensorium with  no motor or cerebellar deficits apparent.        Assessment & Plan:

## 2024-04-14 ENCOUNTER — Ambulatory Visit: Payer: Medicare HMO | Admitting: Internal Medicine

## 2024-04-14 ENCOUNTER — Encounter: Payer: Self-pay | Admitting: Internal Medicine

## 2024-04-14 VITALS — BP 126/74 | HR 74 | Ht 59.0 in | Wt 175.0 lb

## 2024-04-14 DIAGNOSIS — J45909 Unspecified asthma, uncomplicated: Secondary | ICD-10-CM

## 2024-04-14 DIAGNOSIS — J45991 Cough variant asthma: Secondary | ICD-10-CM

## 2024-04-14 MED ORDER — CLOTRIMAZOLE 10 MG MT TROC: 10.0000 mg | Freq: Every day | OROMUCOSAL | 5 refills | Status: DC

## 2024-04-14 NOTE — Patient Instructions (Signed)
 Work on inhaler technique:  relax and gently blow all the way out then take a nice smooth full deep breath back in, triggering the inhaler at same time you start breathing in.  Hold breath in for at least  5 seconds if you can. Blow out Breztri   thru nose. Rinse and gargle with water when done.  If mouth or throat bother you at all,  try brushing teeth/gums/tongue with arm and hammer toothpaste/ make a slurry and gargle and spit out.   Ok to use clotrimazole  10 mg troch up to 5 x daily as needed   F/u Please schedule a follow up visit in 6 months but call sooner if needed with Nurse Practioner

## 2024-04-14 NOTE — Assessment & Plan Note (Addendum)
-   Spirometry 12/11/2014  Min abn mid flows only   -  10/15/2020  After extensive coaching inhaler device,  effectiveness =    90%  -   Nucala  Rx  July 03 2022 >>>  All goals of chronic asthma control met including optimal function and elimination of symptoms with minimal need for rescue therapy.  Contingencies discussed in full including contacting this office immediately if not controlling the symptoms using the rule of two's.    - The proper method of use, as well as anticipated side effects, of a metered-dose inhaler were discussed and demonstrated to the patient using teach back method. Improved effectiveness after extensive coaching during this visit to a level of approximately 80 % from a baseline of 60 % (short Ti)    - Clotrimazole  troche prn thrush if can't be prevented by brushing / gargling with arm and hammer toothpaste p using Breztri     F/u  6 m, sooner prn          Each maintenance medication was reviewed in detail including emphasizing most importantly the difference between maintenance and prns and under what circumstances the prns are to be triggered using an action plan format where appropriate.  Total time for H and P, chart review, counseling, reviewing hfa  device(s) and generating customized AVS unique to this office visit / same day charting = 22 min

## 2024-04-18 ENCOUNTER — Ambulatory Visit: Admitting: Gastroenterology

## 2024-04-18 ENCOUNTER — Encounter: Payer: Self-pay | Admitting: Gastroenterology

## 2024-04-18 ENCOUNTER — Telehealth: Payer: Self-pay

## 2024-04-18 VITALS — BP 90/60 | HR 96 | Ht 60.0 in | Wt 179.0 lb

## 2024-04-18 DIAGNOSIS — Z800 Family history of malignant neoplasm of digestive organs: Secondary | ICD-10-CM

## 2024-04-18 DIAGNOSIS — R1013 Epigastric pain: Secondary | ICD-10-CM | POA: Diagnosis not present

## 2024-04-18 DIAGNOSIS — Z860100 Personal history of colon polyps, unspecified: Secondary | ICD-10-CM

## 2024-04-18 DIAGNOSIS — K449 Diaphragmatic hernia without obstruction or gangrene: Secondary | ICD-10-CM | POA: Diagnosis not present

## 2024-04-18 DIAGNOSIS — K219 Gastro-esophageal reflux disease without esophagitis: Secondary | ICD-10-CM

## 2024-04-18 MED ORDER — FAMOTIDINE 40 MG PO TABS: 40.0000 mg | ORAL_TABLET | Freq: Every day | ORAL | 3 refills | Status: DC

## 2024-04-18 MED ORDER — PANTOPRAZOLE SODIUM 40 MG PO TBEC: 40.0000 mg | DELAYED_RELEASE_TABLET | Freq: Two times a day (BID) | ORAL | 3 refills | Status: DC

## 2024-04-18 MED ORDER — NA SULFATE-K SULFATE-MG SULF 17.5-3.13-1.6 GM/177ML PO SOLN: 1.0000 | Freq: Once | ORAL | 0 refills | Status: AC

## 2024-04-18 NOTE — Progress Notes (Signed)
 Chief Complaint: follow up Primary GI MD: Dr. 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  History of Present Illness Andrea Mason is a 73 year old female with GERD who presents with worsening reflux symptoms and weight gain.  She experiences worsening reflux symptoms despite an increase in pantoprazole  to twice daily. She often forgets the evening dose due to caregiving responsibilities. Currently, she takes pantoprazole  40 mg in the morning but frequently misses the evening dose. She describes a sensation of liquid in her throat, which she notes is 'not dirty' and 'not green'. No pain is reported.  She has experienced significant weight gain recently, increasing from 169 pounds to 180 pounds over a short period. She attributes this to stress eating, particularly in anticipation of her granddaughter's wedding in June.  Her past medical history includes a stroke and deep vein thrombosis (DVT) for which she was previously prescribed a blood thinner. However, she is not currently taking it due to cost concerns. The stroke and DVT occurred over a year ago.  She mentions a busy social life, including caregiving for her sister's ex-partner who was recently hospitalized, and preparing for her granddaughter's wedding. She also notes excitement about an upcoming great-grandchild.   PREVIOUS GI WORKUP   EGD 02/2020 for persistent GERD - Esophagogastric landmarks identified.  - 1 cm hiatal hernia.  - Widely patient mild Shatski ring  - A single gastric polyp at the cardia as described above. Resected and retrieved.  - Multiple gastric polyps, benign appearing.  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 Biopsied to ensure no adenomatous changes - Normal  duodenum otherwise.   Diagnosis 1. Surgical [P], duodenal bulb - HETEROTOPIC GASTRIC MUCOSA. - NO DYSPLASIA OR MALIGNANCY. 2. Surgical [P], stomach, cardia, polyp - FUNDIC GLAND POLYP(S). - WARTHIN-STARRY IS NEGATIVE FOR HELICOBACTER PYLORI. - NO INTESTINAL METAPLASIA, DYSPLASIA, OR MALIGNANCY.  Colonoscopy 2020 - One 3 mm polyp in the cecum, removed with a cold snare. Resected and retrieved.  - Three 2 to 6 mm polyps in the transverse colon, removed with a cold snare. Resected and retrieved.  - Tortuous colon.  - The examination was otherwise normal.  Past Medical History:  Diagnosis Date   Allergic rhinitis    Allergy     Asthma    Depression    DJD (degenerative joint disease), cervical    DVT (deep venous thrombosis) (HCC)    GERD (gastroesophageal reflux disease)    History of colonic polyps    Hypertension    Obesity    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   Stroke [facility])    Syncope     Past Surgical History:  Procedure Laterality Date   COLONOSCOPY     FOOT SURGERY  1998   Implantable loop recorder placement  09/16/14   MDT LINQ implanted by Dr Nunzio Belch for cryptogenic stroke, RIO II protocol   LOOP RECORDER REMOVAL N/A 10/02/2017   Procedure: LOOP RECORDER REMOVAL;  Surgeon: Jolly Needle, MD;  Location: [facility];  Service: Cardiovascular;  Laterality: N/A;   NASAL POLYP SURGERY  07/2006   x 2 with Dr. Melissa Spring   NASAL SINUS SURGERY  07/2006   Dr. Melissa Spring   TUBAL LIGATION      Current Outpatient Medications  Medication Sig Dispense  Refill   acetaminophen  (TYLENOL ) 325 MG tablet Per bottle as needed     albuterol  (VENTOLIN  HFA) 108 (90 Base) MCG/ACT inhaler Inhale 1-2 puffs into the lungs every 6 (six) hours as needed for wheezing or shortness of breath. 1 each 3   alendronate  (FOSAMAX ) 70 MG tablet TAKE 1 TABLET(70 MG) BY MOUTH EVERY 7 DAYS WITH A FULL GLASS OF WATER AND ON AN EMPTY STOMACH 12 tablet 3   AMBULATORY NON FORMULARY MEDICATION  Rollator walker with seat Dispense 1 Dx code: M54.16, R29.898 Use as needed 1 Device 0   aspirin  EC 81 MG tablet Take 1 tablet (81 mg total) by mouth daily. Swallow whole. 30 tablet 12   Biotin 16109 MCG TABS Take 10,000 mcg by mouth daily.     budeson-glycopyrrolate-formoterol  (BREZTRI  AEROSPHERE) 160-9-4.8 MCG/ACT AERO inhaler Inhale 2 puffs into the lungs 2 (two) times daily.     cetirizine  (ZYRTEC ) 10 MG tablet Take 10 mg by mouth at bedtime.      Cholecalciferol (VITAMIN D3) 2000 units TABS Take 2,000 Units by mouth daily.     Cyanocobalamin  (VITAMIN B-12 PO) Take 1,000 mcg by mouth daily.     gabapentin  (NEURONTIN ) 100 MG capsule Take 1-3 capsules (100-300 mg total) by mouth 3 (three) times daily as needed. 90 capsule 3   ipratropium (ATROVENT ) 0.06 % nasal spray Place 2 sprays into both nostrils 3 (three) times daily. 15 mL 5   Mepolizumab  (NUCALA ) 100 MG/ML SOAJ Inject into the skin every 28 (twenty-eight) days.     montelukast  (SINGULAIR ) 10 MG tablet Take 1 tablet (10 mg total) by mouth at bedtime. 90 tablet 3   pantoprazole  (PROTONIX ) 40 MG tablet Take 1 tablet (40 mg total) by mouth 2 (two) times daily. 60 tablet 2   Respiratory Therapy Supplies (FLUTTER) DEVI Use as directed     sodium chloride  (OCEAN) 0.65 % SOLN nasal spray Place 2 sprays into both nostrils as needed (dry nose).      Spacer/Aero-Holding Ismael Maria by Does not apply route.     spironolactone  (ALDACTONE ) 25 MG tablet TAKE 1 TABLET(25 MG) BY MOUTH DAILY 90 tablet 1   apixaban  (ELIQUIS ) 2.5 MG TABS tablet Take 1 tablet (2.5 mg total) by mouth 2 (two) times daily. (Patient not taking: Reported on 04/18/2024) 180 tablet 3   clotrimazole  (MYCELEX ) 10 MG troche Take 1 tablet (10 mg total) by mouth 5 (five) times daily. (Patient not taking: Reported on 04/18/2024) 30 Troche 5   Current Facility-Administered Medications  Medication Dose Route Frequency Provider Last Rate Last Admin   mepolizumab  (NUCALA ) injection 100  mg  100 mg Subcutaneous Q28 days Rochester Chuck, MD   100 mg at 04/09/24 1623    Allergies as of 04/18/2024 - Review Complete 04/18/2024  Allergen Reaction Noted   Iodine Nausea And Vomiting and Rash 12/21/2012   Ivp dye [iodinated contrast media] Nausea And Vomiting and Other (See Comments) 12/21/2012   Promethazine -codeine  Nausea And Vomiting 11/15/2010   Tramadol  Other (See Comments) 03/31/2014    Family History  Problem Relation Age of Onset   Asthma Mother    Allergic rhinitis Mother    Cancer Mother    Allergic rhinitis Father    Asthma Sister    Allergic rhinitis Sister    Diabetes Sister    Allergic rhinitis Brother    Colon cancer Brother    Stroke Brother    Arrhythmia Brother    Allergic rhinitis Brother    Congenital heart  disease Brother    Heart attack Brother    Lung disease Son        Sarcoid vs ARDS related fibrosis, required 2 double lung transplants   Esophageal cancer Neg Hx    Rectal cancer Neg Hx    Stomach cancer Neg Hx     Social History   Socioeconomic History   Marital status: Widowed    Spouse name: Not on file   Number of children: 3   Years of education: Not on file   Highest education level: Not on file  Occupational History   Occupation: Museum/gallery curator    Comment: [employer]   Occupation: direct Patent examiner  Tobacco Use   Smoking status: Never    Passive exposure: Current (on sister's clothing bc sister is a smoker)   Smokeless tobacco: Never  Vaping Use   Vaping status: Never Used  Substance and Sexual Activity   Alcohol use: Yes    Comment: socially wine   Drug use: No   Sexual activity: Not Currently    Birth control/protection: Post-menopausal  Other Topics Concern   Not on file  Social History Narrative   Widowed, husband died in 05-11-06   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   Patient lives alone.   Social Drivers of Research scientist (physical sciences) Strain: Low Risk  (10/19/2023)   Overall Financial Resource Strain (CARDIA)    Difficulty of Paying Living Expenses: Not hard at all  Food Insecurity: No Food Insecurity (10/19/2023)   Hunger Vital Sign    Worried About Running Out of Food in the Last Year: Never true    Ran Out of Food in the Last Year: Never true  Transportation Needs: No Transportation Needs (10/19/2023)   PRAPARE - Administrator, Civil Service (Medical): No    Lack of Transportation (Non-Medical): No  Physical Activity: Inactive (10/19/2023)   Exercise Vital Sign    Days of Exercise per Week: 0 days    Minutes of Exercise per Session: 0 min  Stress: No Stress Concern Present (10/19/2023)   [employer] of Occupational Health - Occupational Stress Questionnaire    Feeling of Stress : Not at all  Social Connections: Moderately Integrated (10/19/2023)   Social Connection and Isolation Panel [NHANES]    Frequency of Communication with Friends and Family: More than three times a week    Frequency of Social Gatherings with Friends and Family: Twice a week    Attends Religious Services: More than 4 times per year    Active Member of [employer] or Organizations: Yes    Attends Banker Meetings: More than 4 times per year    Marital Status: Widowed  Intimate Partner Violence: Not At Risk (10/19/2023)   Humiliation, Afraid, Rape, and Kick questionnaire    Fear of Current or Ex-Partner: No    Emotionally Abused: No    Physically Abused: No    Sexually Abused: No    Review of Systems:    Constitutional: No weight loss, fever, chills, weakness or fatigue HEENT: Eyes: No change in vision               Ears, Nose, Throat:  No change in hearing or congestion Skin: No rash or itching Cardiovascular: No chest pain, chest pressure or palpitations   Respiratory: No SOB or cough Gastrointestinal: See HPI and otherwise negative Genitourinary: No dysuria or change  in urinary  frequency Neurological: No headache, dizziness or syncope Musculoskeletal: No new muscle or joint pain Hematologic: No bleeding or bruising Psychiatric: No history of depression or anxiety    Physical Exam:  Vital signs: BP 90/60 (BP Location: Left Arm, Patient Position: Sitting, Cuff Size: Large)   Pulse 96   Ht 5' (1.524 m) Comment: height measured without shoes  Wt 179 lb (81.2 kg)   LMP  (LMP Unknown)   BMI 34.96 kg/m   Constitutional: NAD, alert and cooperative Head:  Normocephalic and atraumatic. Eyes:   PEERL, EOMI. No icterus. Conjunctiva pink. Respiratory: Respirations even and unlabored. Lungs clear to auscultation bilaterally.   No wheezes, crackles, or rhonchi.  Cardiovascular:  Regular rate and rhythm. No peripheral edema, cyanosis or pallor.  Gastrointestinal:  Soft, nondistended, nontender. No rebound or guarding. Normal bowel sounds. No appreciable masses or hepatomegaly. Rectal:  Declines Msk:  Symmetrical without gross deformities. Without edema, no deformity or joint abnormality.  Neurologic:  Alert and  oriented x4;  grossly normal neurologically.  Skin:   Dry and intact without significant lesions or rashes. Psychiatric: Oriented to person, place and time. Demonstrates good judgement and reason without abnormal affect or behaviors.   RELEVANT LABS AND IMAGING: CBC    Component Value Date/Time   WBC 5.0 01/29/2024 1434   WBC 4.1 01/01/2024 1423   RBC 3.85 (L) 01/29/2024 1434   HGB 11.6 (L) 01/29/2024 1434   HGB 13.2 04/11/2022 1628   HCT 34.9 (L) 01/29/2024 1434   HCT 36.4 04/11/2022 1628   PLT 210 01/29/2024 1434   MCV 90.6 01/29/2024 1434   MCV 89 04/11/2022 1628   MCH 30.1 01/29/2024 1434   MCHC 33.2 01/29/2024 1434   RDW 13.0 01/29/2024 1434   RDW 11.8 04/11/2022 1628   LYMPHSABS 2.0 01/29/2024 1434   LYMPHSABS 2.1 04/11/2022 1628   MONOABS 0.4 01/29/2024 1434   EOSABS 0.1 01/29/2024 1434   EOSABS 0.7 (H) 04/11/2022 1628   BASOSABS 0.0  01/29/2024 1434   BASOSABS 0.1 04/11/2022 1628    CMP     Component Value Date/Time   NA 128 (L) 01/29/2024 1434   K 4.8 01/29/2024 1434   CL 94 (L) 01/29/2024 1434   CO2 29 01/29/2024 1434   GLUCOSE 101 (H) 01/29/2024 1434   BUN 10 01/29/2024 1434   CREATININE 1.04 (H) 01/29/2024 1434   CREATININE 1.09 (H) 09/09/2020 0938   CALCIUM  9.6 01/29/2024 1434   PROT 7.1 01/29/2024 1434   ALBUMIN 4.0 01/29/2024 1434   AST 15 01/29/2024 1434   ALT 11 01/29/2024 1434   ALKPHOS 60 01/29/2024 1434   BILITOT 1.1 01/29/2024 1434   GFRNONAA 57 (L) 01/29/2024 1434   GFRNONAA 52 (L) 09/09/2020 0938   GFRAA 61 09/09/2020 0938     Assessment/Plan:    Dyspepsia Hiatal hernia Chronic dyspeptic symptoms despite pantoprazole  40 Mg once daily.  EGD in 2021 for same symptoms showed fundic gland polyps and 1 cm hiatal hernia. Characterized by indigestion, eructation, and bloating. Recommended increasing to twice daily at last visit but unable to stay compliant due to being busy.  Persistent symptoms - Recommend famotidine  at bedtime to take with her other bedtime medicines as this may be more amenable to her lifestyle - Educated patient on lifestyle modifications - If persistent symptoms at follow-up visit we will consider repeat EGD - Follow-up 3 months   Colonoscopy Surveillance   Due for colonoscopy in July 2025 due to family history of colon  cancer.  Colonoscopy 2020 with small polyps, tortuous colon. - Schedule colonoscopy - I thoroughly discussed the procedure with the patient (at bedside) to include nature of the procedure, alternatives, benefits, and risks (including but not limited to bleeding, infection, perforation, anesthesia/cardiac pulmonary complications).  Patient verbalized understanding and gave verbal consent to proceed with procedure.   DVT CVA  on Eliquis  - Will hold Eliquis   2 days prior to endoscopic procedures - will instruct when and how to resume after procedure.  Benefits and risks of procedure explained including risks of bleeding, perforation, infection, missed lesions, reactions to medications and possible need for hospitalization and surgery for complications. Additional rare but real risk of stroke or other vascular clotting events off Eliquis  also explained and need to seek urgent help if any signs of these problems occur. Will communicate by phone or EMR with patient's  prescribing provider to confirm that holding Eliquis  is reasonable in this case.    [facility] 04/18/2024, 3:38 PM  Cc: Roslyn Coombe, MD

## 2024-04-18 NOTE — Telephone Encounter (Signed)
   [facility] [address] [place], [place]  [address] Phone:  [phone]   Fax:  [phone]      Andrea Mason [birth year] [id number]  04/18/2024   Dear Dr. Autry Legions:  We have scheduled the above named patient for a colonoscopy procedure. Our records show that (s)he is on anticoagulation therapy.  Please advise as to whether the patient may come off their therapy of Eliquis  2 days prior to their procedure which is scheduled for 06/18/2024.  Please route your response to Caleen Catalan, CMA or fax response to [phone].  Sincerely,     Gastroenterology

## 2024-04-18 NOTE — Patient Instructions (Addendum)
 We have sent the following medications to your pharmacy for you to pick up at your convenience: Famotidine  40mg    You have been scheduled for a colonoscopy. Please follow written instructions given to you at your visit today.   If you use inhalers (even only as needed), please bring them with you on the day of your procedure.  DO NOT TAKE 7 DAYS PRIOR TO TEST- Trulicity (dulaglutide) Ozempic, Wegovy (semaglutide) Mounjaro (tirzepatide) Bydureon Bcise (exanatide extended release)  DO NOT TAKE 1 DAY PRIOR TO YOUR TEST Rybelsus (semaglutide) Adlyxin (lixisenatide) Victoza (liraglutide) Byetta (exanatide) ___________________________________________________________________________  Thank you for trusting me with your gastrointestinal care!   Suzanna Erp, PA  _______________________________________________________  If your blood pressure at your visit was 140/90 or greater, please contact your primary care physician to follow up on this.  _______________________________________________________  If you are age 98 or older, your body mass index should be between 23-30. Your Body mass index is 34.96 kg/m. If this is out of the aforementioned range listed, please consider follow up with your Primary Care Provider.  If you are age 71 or younger, your body mass index should be between 19-25. Your Body mass index is 34.96 kg/m. If this is out of the aformentioned range listed, please consider follow up with your Primary Care Provider.   ________________________________________________________  The [place] [facility] providers would like to encourage you to use MYCHART to communicate with providers for non-urgent requests or questions.  Due to long hold times on the telephone, sending your provider a message by [facility] may be a faster and more efficient way to get a response.  Please allow 48 business hours for a response.  Please remember that this is for non-urgent requests.   _______________________________________________________

## 2024-04-19 NOTE — Telephone Encounter (Signed)
 Ok for hold Eliquis  x 2 days prior to procedure

## 2024-04-21 NOTE — Progress Notes (Signed)
 Agree with assessment plan as outlined.  Could also add FDgard for treatment of dyspepsia if symptoms persist

## 2024-04-22 ENCOUNTER — Encounter: Payer: Self-pay | Admitting: Nurse Practitioner

## 2024-04-22 ENCOUNTER — Telehealth (HOSPITAL_BASED_OUTPATIENT_CLINIC_OR_DEPARTMENT_OTHER): Payer: Self-pay

## 2024-04-22 NOTE — Telephone Encounter (Signed)
 Copied from CRM [id number]. Topic: General - Other >> Apr 22, 2024  5:02 PM [place] S wrote: Reason for CRM: patient is caling because she is feeling sick, she have not been able to talk for days and today she can speak a little, patient is coughing up green and grey and want to know if she would need other medicine along with the medicine she is taking, please call patient.

## 2024-04-23 ENCOUNTER — Encounter: Payer: Self-pay | Admitting: Nurse Practitioner

## 2024-04-23 ENCOUNTER — Ambulatory Visit: Admitting: Nurse Practitioner

## 2024-04-23 VITALS — BP 110/76 | HR 90 | Wt 177.0 lb

## 2024-04-23 DIAGNOSIS — N764 Abscess of vulva: Secondary | ICD-10-CM | POA: Diagnosis not present

## 2024-04-23 DIAGNOSIS — R82998 Other abnormal findings in urine: Secondary | ICD-10-CM | POA: Diagnosis not present

## 2024-04-23 DIAGNOSIS — N94819 Vulvodynia, unspecified: Secondary | ICD-10-CM | POA: Diagnosis not present

## 2024-04-23 DIAGNOSIS — L0292 Furuncle, unspecified: Secondary | ICD-10-CM | POA: Diagnosis not present

## 2024-04-23 DIAGNOSIS — N9089 Other specified noninflammatory disorders of vulva and perineum: Secondary | ICD-10-CM

## 2024-04-23 DIAGNOSIS — R8290 Unspecified abnormal findings in urine: Secondary | ICD-10-CM

## 2024-04-23 MED ORDER — PREDNISONE 10 MG PO TABS: ORAL_TABLET | ORAL | 0 refills | Status: DC

## 2024-04-23 MED ORDER — AZITHROMYCIN 250 MG PO TABS: ORAL_TABLET | ORAL | 0 refills | Status: DC

## 2024-04-23 NOTE — Telephone Encounter (Signed)
 Pt notified and rx sent to pharmacy

## 2024-04-23 NOTE — Telephone Encounter (Signed)
 Zpak/ Prednisone  10 mg take  4 each am x 2 days,   2 each am x 2 days,  1 each am x 2 days and stop   F/u one week if not back to baseline

## 2024-04-23 NOTE — Progress Notes (Signed)
   Acute Office Visit  Subjective:    Patient ID: Andrea Mason, female    DOB: 1951-06-10, 73 y.o.   MRN: [id number]   HPI 73 y.o. presents today for labial bumps. Noticed 3 bumps on left labia 5 days ago. Painful at times. Tried hydrocortisone  cream yesterday. H/O recurrent bumps for years but have been more frequent this past year. Seen 03/11/24 for same concern by Dr Tia Flowers - Neg MRSA culture, neg STD screening, appeared to be self-limiting. Antibiotics have not worked in the past. Would like UA today for urine odor. Has appt in August with urogyn for OAB.   No LMP recorded (lmp unknown). Patient is postmenopausal.    Review of Systems  Constitutional: Negative.   Genitourinary:  Positive for genital sores, urgency (Chronic) and vaginal pain (Vulvar discomfort). Negative for difficulty urinating, dysuria, flank pain, frequency, hematuria and vaginal discharge.       Malodorous urine       Objective:     Physical Exam Constitutional:      Appearance: Normal appearance.  Genitourinary:    Labia:        Left: Lesion present.        BP 110/76 (BP Location: Right Arm, Patient Position: Sitting, Cuff Size: Large)   Pulse 90   Wt 177 lb (80.3 kg)   LMP  (LMP Unknown)   SpO2 97%   BMI 34.57 kg/m  Wt Readings from Last 3 Encounters:  04/23/24 177 lb (80.3 kg)  04/18/24 179 lb (81.2 kg)  04/14/24 175 lb (79.4 kg)        Patient informed chaperone available to be present for breast and/or pelvic exam. Patient has requested no chaperone to be present. Patient has been advised what will be completed during breast and pelvic exam.   Assessment & Plan:   Problem List Items Addressed This Visit   None Visit Diagnoses       Vulvar lesion    -  Primary   Relevant Orders   SureSwab HSV, Type 1/2 DNA, PCR     Abnormal urine odor       Relevant Orders   Urine Culture      Plan: HSV culture pending. Recommend OTC lidocaine  as needed. Outside of 72 hour window for  antiviral. If HSV neg, begin Panoxyl 10% daily to prevent labial boil recurrences. Urine culture pending.   Return if symptoms worsen or fail to improve.    Andee Bamberger DNP, 12:09 PM 04/23/2024

## 2024-04-23 NOTE — Addendum Note (Signed)
 Addended by: Kary Pages on: 04/23/2024 12:16 PM   Modules accepted: Orders

## 2024-04-24 LAB — URINE CULTURE
MICRO NUMBER:: 16484451
Result:: NO GROWTH
SPECIMEN QUALITY:: ADEQUATE

## 2024-04-24 NOTE — Telephone Encounter (Signed)
 Lmtcb.  (Re: her physician has ok'd her to hold the Eliquis  for 2 days).

## 2024-04-25 ENCOUNTER — Ambulatory Visit: Admitting: Internal Medicine

## 2024-04-25 LAB — SURESWAB HSV, TYPE 1/2 DNA, PCR
HSV 1 DNA: NOT DETECTED
HSV 2 DNA: NOT DETECTED

## 2024-04-25 NOTE — Telephone Encounter (Signed)
 I spoke to Andrea Mason and advised her that Dr. Autry Legions said that it was ok for her to hold the Eliquis  for 2 days prior to her procedure.  Patient stated that is fine but she hasn't taken the medication since February.

## 2024-04-28 ENCOUNTER — Ambulatory Visit: Payer: Self-pay | Admitting: Obstetrics and Gynecology

## 2024-05-07 ENCOUNTER — Ambulatory Visit

## 2024-05-07 DIAGNOSIS — J4550 Severe persistent asthma, uncomplicated: Secondary | ICD-10-CM

## 2024-05-07 NOTE — Telephone Encounter (Signed)
 Review result note. Encounter closed.

## 2024-05-07 NOTE — Telephone Encounter (Signed)
 Pt notified and voiced understanding

## 2024-05-16 ENCOUNTER — Other Ambulatory Visit: Payer: Self-pay | Admitting: Internal Medicine

## 2024-05-20 NOTE — Telephone Encounter (Signed)
 Opened in errror

## 2024-06-04 ENCOUNTER — Ambulatory Visit

## 2024-06-04 DIAGNOSIS — J4550 Severe persistent asthma, uncomplicated: Secondary | ICD-10-CM

## 2024-06-11 ENCOUNTER — Encounter: Payer: Self-pay | Admitting: Gastroenterology

## 2024-06-17 ENCOUNTER — Telehealth: Payer: Self-pay | Admitting: Gastroenterology

## 2024-06-17 NOTE — Telephone Encounter (Signed)
 Called pt. Questions answered regarding which medications she can take.

## 2024-06-17 NOTE — Telephone Encounter (Signed)
 Returned the patient's phone call. No answer left a VM.

## 2024-06-17 NOTE — Telephone Encounter (Signed)
 Patient requesting f/u call to discuss medication. Please advise.

## 2024-06-18 ENCOUNTER — Ambulatory Visit: Admitting: Gastroenterology

## 2024-06-18 ENCOUNTER — Encounter: Payer: Self-pay | Admitting: Gastroenterology

## 2024-06-18 VITALS — BP 116/69 | HR 69 | Temp 97.9°F | Resp 12 | Ht 60.0 in | Wt 179.0 lb

## 2024-06-18 DIAGNOSIS — K6389 Other specified diseases of intestine: Secondary | ICD-10-CM | POA: Diagnosis not present

## 2024-06-18 DIAGNOSIS — F32A Depression, unspecified: Secondary | ICD-10-CM | POA: Diagnosis not present

## 2024-06-18 DIAGNOSIS — D122 Benign neoplasm of ascending colon: Secondary | ICD-10-CM

## 2024-06-18 DIAGNOSIS — K5730 Diverticulosis of large intestine without perforation or abscess without bleeding: Secondary | ICD-10-CM

## 2024-06-18 DIAGNOSIS — K648 Other hemorrhoids: Secondary | ICD-10-CM

## 2024-06-18 DIAGNOSIS — D123 Benign neoplasm of transverse colon: Secondary | ICD-10-CM

## 2024-06-18 DIAGNOSIS — E669 Obesity, unspecified: Secondary | ICD-10-CM | POA: Diagnosis not present

## 2024-06-18 DIAGNOSIS — Z1211 Encounter for screening for malignant neoplasm of colon: Secondary | ICD-10-CM | POA: Diagnosis not present

## 2024-06-18 DIAGNOSIS — J45909 Unspecified asthma, uncomplicated: Secondary | ICD-10-CM | POA: Diagnosis not present

## 2024-06-18 DIAGNOSIS — Z860101 Personal history of adenomatous and serrated colon polyps: Secondary | ICD-10-CM | POA: Diagnosis not present

## 2024-06-18 DIAGNOSIS — Z860100 Personal history of colon polyps, unspecified: Secondary | ICD-10-CM

## 2024-06-18 DIAGNOSIS — I10 Essential (primary) hypertension: Secondary | ICD-10-CM | POA: Diagnosis not present

## 2024-06-18 MED ORDER — SODIUM CHLORIDE 0.9 % IV SOLN: 500.0000 mL | Freq: Once | INTRAVENOUS | Status: DC

## 2024-06-18 NOTE — Progress Notes (Signed)
 Called to room to assist during endoscopic procedure.  Patient ID and intended procedure confirmed with present staff. Received instructions for my participation in the procedure from the performing physician.

## 2024-06-18 NOTE — Patient Instructions (Signed)
 YOU HAD AN ENDOSCOPIC PROCEDURE TODAY AT THE [facility]:   Refer to the procedure report that was given to you for any specific questions about what was found during the examination.  If the procedure report does not answer your questions, please call your gastroenterologist to clarify.  If you requested that your care partner not be given the details of your procedure findings, then the procedure report has been included in a sealed envelope for you to review at your convenience later.  YOU SHOULD EXPECT: Some feelings of bloating in the abdomen. Passage of more gas than usual.  Walking can help get rid of the air that was put into your GI tract during the procedure and reduce the bloating. If you had a lower endoscopy (such as a colonoscopy or flexible sigmoidoscopy) you may notice spotting of blood in your stool or on the toilet paper. If you underwent a bowel prep for your procedure, you may not have a normal bowel movement for a few days.  Please Note:  You might notice some irritation and congestion in your nose or some drainage.  This is from the oxygen used during your procedure.  There is no need for concern and it should clear up in a day or so.  SYMPTOMS TO REPORT IMMEDIATELY:  Following lower endoscopy (colonoscopy or flexible sigmoidoscopy):  Excessive amounts of blood in the stool  Significant tenderness or worsening of abdominal pains  Swelling of the abdomen that is new, acute  Fever of 100F or higher  For urgent or emergent issues, a gastroenterologist can be reached at any hour by calling [phone]. Do not use MyChart messaging for urgent concerns.    DIET:  We do recommend a small meal at first, but then you may proceed to your regular diet.  Drink plenty of fluids but you should avoid alcoholic beverages for 24 hours.  ACTIVITY:  You should plan to take it easy for the rest of today and you should NOT DRIVE or use heavy machinery until tomorrow (because of  the sedation medicines used during the test).    FOLLOW UP: Our staff will call the number listed on your records the next business day following your procedure.  We will call around 7:15- 8:00 am to check on you and address any questions or concerns that you may have regarding the information given to you following your procedure. If we do not reach you, we will leave a message.     If any biopsies were taken you will be contacted by phone or by letter within the next 1-3 weeks.  Please call us at [phone] if you have not heard about the biopsies in 3 weeks.    SIGNATURES/CONFIDENTIALITY: You and/or your care partner have signed paperwork which will be entered into your electronic medical record.  These signatures attest to the fact that that the information above on your After Visit Summary has been reviewed and is understood.  Full responsibility of the confidentiality of this discharge information lies with you and/or your care-partner.

## 2024-06-18 NOTE — Op Note (Signed)
 Andrea Mason Patient Name: Andrea Mason Procedure Date: 06/18/2024 1:31 PM MRN: [id number] Endoscopist: Elspeth P. Leigh , MD, [id number] Age: 73 Referring MD:  Date of Birth: October 06, 1951 Gender: Female Account #: [id number] Procedure:                Colonoscopy Indications:              High risk colon cancer surveillance: Personal                            history of colonic polyps 0 last exam 06/2019 - 2                            adenomas, brother had colon cancer dx age 67s. (Of                            note patient was on Eliquis  at time of office visit                            however she self stopped, cannot afford it) Medicines:                Monitored Anesthesia Care Procedure:                Pre-Anesthesia Assessment:                           - Prior to the procedure, a History and Physical                            was performed, and patient medications and                            allergies were reviewed. The patient's tolerance of                            previous anesthesia was also reviewed. The risks                            and benefits of the procedure and the sedation                            options and risks were discussed with the patient.                            All questions were answered, and informed consent                            was obtained. Prior Anticoagulants: The patient has                            taken no anticoagulant or antiplatelet agents. ASA                            Grade Assessment: III - A patient with severe  systemic disease. After reviewing the risks and                            benefits, the patient was deemed in satisfactory                            condition to undergo the procedure.                           After obtaining informed consent, the colonoscope                            was passed under direct vision. Throughout the                            procedure,  the patient's blood pressure, pulse, and                            oxygen saturations were monitored continuously. The                            Olympus Scope [id number] was introduced through the                            anus and advanced to the the cecum, identified by                            appendiceal orifice and ileocecal valve. The                            colonoscopy was performed without difficulty. The                            patient tolerated the procedure well. The quality                            of the bowel preparation was good. The ileocecal                            valve, appendiceal orifice, and rectum were                            photographed. Scope In: 1:34:28 PM Scope Out: 1:50:06 PM Scope Withdrawal Time: 0 hours 13 minutes 11 seconds  Total Procedure Duration: 0 hours 15 minutes 38 seconds  Findings:                 The perianal and digital rectal examinations were                            normal.                           A 3 mm polyp was found in the ascending colon. The  polyp was sessile. The polyp was removed with a                            cold snare. Resection and retrieval were complete.                           A diminutive polyp was found in the transverse                            colon. The polyp was sessile. The polyp was removed                            with a cold snare. Resection and retrieval were                            complete.                           A few small-mouthed diverticula were found in the                            sigmoid colon.                           Internal hemorrhoids were found during                            retroflexion. The hemorrhoids were small.                           The exam was otherwise without abnormality. Complications:            No immediate complications. Estimated blood loss:                            Minimal. Estimated Blood Loss:     Estimated  blood loss was minimal. Impression:               - One 3 mm polyp in the ascending colon, removed                            with a cold snare. Resected and retrieved.                           - One diminutive polyp in the transverse colon,                            removed with a cold snare. Resected and retrieved.                           - Diverticulosis in the sigmoid colon.                           - Internal hemorrhoids.                           -  The examination was otherwise normal. Recommendation:           - Patient has a contact number available for                            emergencies. The signs and symptoms of potential                            delayed complications were discussed with the                            patient. Return to normal activities tomorrow.                            Written discharge instructions were provided to the                            patient.                           - Resume previous diet.                           - Await pathology results. Repeat colonoscopy in 5                            years given strong family history of colon cancer                           - Follow up with primary care to discuss other                            options for anticoagulation given she cannot afford                            Eliquis  and no longer takig Andrea Doane P. Sharifa Bucholz, MD 06/18/2024 1:57:51 PM This report has been signed electronically.

## 2024-06-18 NOTE — Progress Notes (Signed)
 [facility] History and Physical   Primary Care Physician:  Andrea Mason ORN, MD   Reason for Procedure:   History of colon polyps  Plan:    colonoscopy     HPI: Andrea Mason is a 73 y.o. female  here for colonoscopy surveillance - 4 polyps removed 06/2019, 2 adenomas. Brother had colon cancer dx age 97s.  Eliquis  held for 2 days prior to this exam (in fact she self discontinued this a few months ago, cannot afford it).   Patient denies any bowel symptoms at this time.  Otherwise feels well without any cardiopulmonary symptoms.   I have discussed risks / benefits of anesthesia and endoscopic procedure with Andrea Mason and they wish to proceed with the exams as outlined today.    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 Mable   NASAL SINUS SURGERY  07/2006   Dr. Mable   TUBAL LIGATION      Prior to Admission medications   Medication Sig Start Date End Date Taking? Authorizing Provider  alendronate  (FOSAMAX ) 70 MG tablet TAKE 1 TABLET(70 MG) BY MOUTH EVERY 7 DAYS WITH A FULL GLASS OF WATER AND ON AN EMPTY STOMACH 10/29/23  Yes Andrea Mason ORN, MD  aspirin  EC 81 MG tablet Take 1 tablet (81 mg total) by mouth daily.  Swallow whole. 09/01/22  Yes Andrea Mason ORN, MD  Biotin 89999 MCG TABS Take 10,000 mcg by mouth daily.   Yes [provider]  budeson-glycopyrrolate-formoterol  (BREZTRI  AEROSPHERE) 160-9-4.8 MCG/ACT AERO inhaler Inhale 2 puffs into the lungs 2 (two) times daily.   Yes [provider]  cetirizine  (ZYRTEC ) 10 MG tablet Take 10 mg by mouth at bedtime.    Yes [provider]  Cholecalciferol (VITAMIN D3) 2000 units TABS Take 2,000 Units by mouth daily.   Yes [provider]  Cyanocobalamin  (VITAMIN B-12 PO) Take 1,000 mcg by mouth daily.   Yes [provider]  famotidine  (PEPCID ) 40 MG tablet Take 1 tablet (40 mg total) by mouth at bedtime. 04/18/24  Yes McMichael, Bayley M, PA-C  montelukast  (SINGULAIR ) 10 MG tablet Take 1 tablet (10 mg total) by mouth at bedtime. 02/05/24  Yes Andrea Andrea Saltness, MD  OVER THE COUNTER MEDICATION GO-LI   Yes [provider]  pantoprazole  (PROTONIX ) 40 MG tablet Take 1 tablet (40 mg total) by mouth 2 (two) times daily. 04/18/24  Yes McMichael, Bayley M, PA-C  sodium chloride  (OCEAN) 0.65 % SOLN nasal spray Place 2 sprays into both nostrils as needed (dry nose).    Yes [provider]  Spacer/Aero-Holding Raguel FRENCH  by Does not apply route.   Yes [provider]  spironolactone  (ALDACTONE ) 25 MG tablet TAKE 1 TABLET(25 MG) BY MOUTH DAILY 05/19/24  Yes Andrea Mason ORN, MD  acetaminophen  (TYLENOL ) 325 MG tablet Per bottle as needed 02/17/19   [provider]  albuterol  (VENTOLIN  HFA) 108 (90 Base) MCG/ACT inhaler Inhale 1-2 puffs into the lungs every 6 (six) hours as needed for wheezing or shortness of breath. 02/05/24   Andrea Andrea Saltness, MD  AMBULATORY NON FORMULARY MEDICATION Rollator walker with seat Dispense 1 Dx code: M54.16, R29.898 Use as needed 01/22/24   Corey, Andrea S, MD  apixaban  (ELIQUIS ) 2.5 MG TABS tablet Take 1 tablet (2.5 mg total) by mouth 2 (two) times daily. Patient not  taking: Reported on 04/23/2024 04/24/23   Andrea Mason ORN, MD  azithromycin  (ZITHROMAX ) 250 MG tablet Take two tablets day one the one tablet daily after Patient not taking: Reported on 06/18/2024 04/23/24   Andrea Ozell NOVAK, MD  clotrimazole  (MYCELEX ) 10 MG troche Take 1 tablet (10 mg total) by mouth 5 (five) times daily. Patient not taking: Reported on 04/18/2024 04/14/24   Andrea Ozell NOVAK, MD  gabapentin  (NEURONTIN ) 100 MG capsule Take 1-3 capsules (100-300 mg total) by mouth 3 (three) times daily as needed. 01/09/24   Corey, Andrea S, MD  ipratropium (ATROVENT ) 0.06 % nasal spray Place 2 sprays into both nostrils 3 (three) times daily. 02/05/24   Andrea Andrea Saltness, MD  Mepolizumab  (NUCALA ) 100 MG/ML SOAJ Inject into the skin every 28 (twenty-eight) days.    [provider]  predniSONE  (DELTASONE ) 10 MG tablet 4 each am x 2 days,   2 each am x 2 days,  1 each am x 2 days and stop Patient not taking: Reported on 06/18/2024 04/23/24   Andrea Ozell NOVAK, MD  Respiratory Therapy Supplies (FLUTTER) DEVI Use as directed    [provider]    Current Outpatient Medications  Medication Sig Dispense Refill   alendronate  (FOSAMAX ) 70 MG tablet TAKE 1 TABLET(70 MG) BY MOUTH EVERY 7 DAYS WITH A FULL GLASS OF WATER AND ON AN EMPTY STOMACH 12 tablet 3   aspirin  EC 81 MG tablet Take 1 tablet (81 mg total) by mouth daily. Swallow whole. 30 tablet 12   Biotin 89999 MCG TABS Take 10,000 mcg by mouth daily.     budeson-glycopyrrolate-formoterol  (BREZTRI  AEROSPHERE) 160-9-4.8 MCG/ACT AERO inhaler Inhale 2 puffs into the lungs 2 (two) times daily.     cetirizine  (ZYRTEC ) 10 MG tablet Take 10 mg by mouth at bedtime.      Cholecalciferol (VITAMIN D3) 2000 units TABS Take 2,000 Units by mouth daily.     Cyanocobalamin  (VITAMIN B-12 PO) Take 1,000 mcg by mouth daily.     famotidine  (PEPCID ) 40 MG tablet Take 1 tablet (40 mg total) by mouth at bedtime. 30 tablet 3   montelukast  (SINGULAIR ) 10 MG tablet Take 1  tablet (10 mg total) by mouth at bedtime. 90 tablet 3   OVER THE COUNTER MEDICATION GO-LI     pantoprazole  (PROTONIX ) 40 MG tablet Take 1 tablet (40 mg total) by mouth 2 (two) times daily. 60 tablet 3   sodium chloride  (OCEAN) 0.65 % SOLN nasal spray Place 2 sprays into both nostrils as needed (dry nose).      Spacer/Aero-Holding Raguel FRENCH by Does not apply route.     spironolactone  (ALDACTONE ) 25 MG tablet TAKE 1 TABLET(25 MG) BY MOUTH DAILY 90 tablet 1   acetaminophen  (TYLENOL ) 325  MG tablet Per bottle as needed     albuterol  (VENTOLIN  HFA) 108 (90 Base) MCG/ACT inhaler Inhale 1-2 puffs into the lungs every 6 (six) hours as needed for wheezing or shortness of breath. 1 each 3   AMBULATORY NON FORMULARY MEDICATION Rollator walker with seat Dispense 1 Dx code: M54.16, R29.898 Use as needed 1 Device 0   apixaban  (ELIQUIS ) 2.5 MG TABS tablet Take 1 tablet (2.5 mg total) by mouth 2 (two) times daily. (Patient not taking: Reported on 04/23/2024) 180 tablet 3   azithromycin  (ZITHROMAX ) 250 MG tablet Take two tablets day one the one tablet daily after (Patient not taking: Reported on 06/18/2024) 6 tablet 0   clotrimazole  (MYCELEX ) 10 MG troche Take 1 tablet (10 mg total) by mouth 5 (five) times daily. (Patient not taking: Reported on 04/18/2024) 30 Troche 5   gabapentin  (NEURONTIN ) 100 MG capsule Take 1-3 capsules (100-300 mg total) by mouth 3 (three) times daily as needed. 90 capsule 3   ipratropium (ATROVENT ) 0.06 % nasal spray Place 2 sprays into both nostrils 3 (three) times daily. 15 mL 5   Mepolizumab  (NUCALA ) 100 MG/ML SOAJ Inject into the skin every 28 (twenty-eight) days.     predniSONE  (DELTASONE ) 10 MG tablet 4 each am x 2 days,   2 each am x 2 days,  1 each am x 2 days and stop (Patient not taking: Reported on 06/18/2024) 14 tablet 0   Respiratory Therapy Supplies (FLUTTER) DEVI Use as directed     Current Facility-Administered Medications  Medication Dose Route Frequency Provider Last  Rate Last Admin   0.9 %  sodium chloride  infusion  500 mL Intravenous Once Geoffrey Mankin, Elspeth SQUIBB, MD       mepolizumab  (NUCALA ) injection 100 mg  100 mg Subcutaneous Q28 days Andrea Andrea Saltness, MD   100 mg at 06/04/24 1612    Allergies as of 06/18/2024 - Review Complete 06/18/2024  Allergen Reaction Noted   Iodine Nausea And Vomiting and Rash 12/21/2012   Ivp dye [iodinated contrast media] Nausea And Vomiting and Other (See Comments) 12/21/2012   Promethazine -codeine  Nausea And Vomiting 11/15/2010   Tramadol  Other (See Comments) 03/31/2014    Family History  Problem Relation Age of Onset   Asthma Mother    Allergic rhinitis Mother    Cancer Mother    Allergic rhinitis Father    Asthma Sister    Allergic rhinitis Sister    Diabetes Sister    Allergic rhinitis Brother    Colon cancer Brother    Stroke Brother    Arrhythmia Brother    Allergic rhinitis Brother    Congenital heart disease Brother    Heart attack Brother    Lung disease Son        Sarcoid vs ARDS related fibrosis, required 2 double lung transplants   Esophageal cancer Neg Hx    Rectal cancer Neg Hx    Stomach cancer Neg Hx     Social History   Socioeconomic History   Marital status: Widowed    Spouse name: Not on file   Number of children: 3   Years of education: Not on file   Highest education level: Not on file  Occupational History   Occupation: Museum/gallery curator    Comment: [employer]   Occupation: direct Patent examiner  Tobacco Use   Smoking status: Never    Passive exposure: Current (on sister'Mason clothing bc sister is a smoker)   Smokeless tobacco: Never  Vaping Use   Vaping  status: Never Used  Substance and Sexual Activity   Alcohol use: Yes    Comment: socially wine   Drug use: No   Sexual activity: Not Currently    Birth control/protection: Post-menopausal  Other Topics Concern   Not on file  Social History Narrative   Widowed, husband died in July 14, 2006   Works at [employer] as a Scientist, forensic   1 children, 1 died from homicide and 1 child is deceased.   Patient lives alone.   Social Drivers of Corporate investment banker Strain: Low Risk  (10/19/2023)   Overall Financial Resource Strain (CARDIA)    Difficulty of Paying Living Expenses: Not hard at all  Food Insecurity: No Food Insecurity (10/19/2023)   Hunger Vital Sign    Worried About Running Out of Food in the Last Year: Never true    Ran Out of Food in the Last Year: Never true  Transportation Needs: No Transportation Needs (10/19/2023)   PRAPARE - Administrator, Civil Service (Medical): No    Lack of Transportation (Non-Medical): No  Physical Activity: Inactive (10/19/2023)   Exercise Vital Sign    Days of Exercise per Week: 0 days    Minutes of Exercise per Session: 0 min  Stress: No Stress Concern Present (10/19/2023)   [employer] of Occupational Health - Occupational Stress Questionnaire    Feeling of Stress : Not at all  Social Connections: Moderately Integrated (10/19/2023)   Social Connection and Isolation Panel    Frequency of Communication with Friends and Family: More than three times a week    Frequency of Social Gatherings with Friends and Family: Twice a week    Attends Religious Services: More than 4 times per year    Active Member of [employer] or Organizations: Yes    Attends Banker Meetings: More than 4 times per year    Marital Status: Widowed  Intimate Partner Violence: Not At Risk (10/19/2023)   Humiliation, Afraid, Rape, and Kick questionnaire    Fear of Current or Ex-Partner: No    Emotionally Abused: No    Physically Abused: No    Sexually Abused: No    Review of Systems: All other review of systems negative except as mentioned in the HPI.  Physical Exam: Vital signs BP 125/72   Pulse 73   Temp 97.9 F (36.6 C)   Ht 5' (1.524 Mason)   Wt 179 lb (81.2 kg)   LMP  (LMP Unknown)   BMI 34.96 kg/Mason   General:   Alert,  Well-developed, pleasant  and cooperative in NAD Lungs:  Clear throughout to auscultation.   Heart:  Regular rate and rhythm Abdomen:  Soft, nontender and nondistended.   Neuro/Psych:  Alert and cooperative. Normal mood and affect. A and O x 3  Marcey Naval, MD [facility]

## 2024-06-18 NOTE — Progress Notes (Signed)
 Pt's states no medical or surgical changes since previsit or office visit.

## 2024-06-18 NOTE — Progress Notes (Signed)
 Vss nad trans to pacu

## 2024-06-19 ENCOUNTER — Telehealth: Payer: Self-pay

## 2024-06-19 NOTE — Telephone Encounter (Signed)
 No answer, unable to leave a message, B.Kytzia Gienger RN.

## 2024-06-23 ENCOUNTER — Ambulatory Visit: Payer: Self-pay | Admitting: Gastroenterology

## 2024-06-23 LAB — SURGICAL PATHOLOGY

## 2024-07-01 ENCOUNTER — Ambulatory Visit (INDEPENDENT_AMBULATORY_CARE_PROVIDER_SITE_OTHER)

## 2024-07-01 ENCOUNTER — Ambulatory Visit (INDEPENDENT_AMBULATORY_CARE_PROVIDER_SITE_OTHER): Payer: Medicare HMO | Admitting: Internal Medicine

## 2024-07-01 ENCOUNTER — Encounter: Payer: Self-pay | Admitting: Internal Medicine

## 2024-07-01 VITALS — BP 130/82 | HR 71 | Temp 97.7°F | Ht 60.0 in | Wt 183.6 lb

## 2024-07-01 DIAGNOSIS — J4550 Severe persistent asthma, uncomplicated: Secondary | ICD-10-CM

## 2024-07-01 DIAGNOSIS — E7849 Other hyperlipidemia: Secondary | ICD-10-CM

## 2024-07-01 DIAGNOSIS — I6310 Cerebral infarction due to embolism of unspecified precerebral artery: Secondary | ICD-10-CM | POA: Insufficient documentation

## 2024-07-01 DIAGNOSIS — I10 Essential (primary) hypertension: Secondary | ICD-10-CM

## 2024-07-01 DIAGNOSIS — R739 Hyperglycemia, unspecified: Secondary | ICD-10-CM

## 2024-07-01 DIAGNOSIS — Z7901 Long term (current) use of anticoagulants: Secondary | ICD-10-CM | POA: Diagnosis not present

## 2024-07-01 DIAGNOSIS — E559 Vitamin D deficiency, unspecified: Secondary | ICD-10-CM | POA: Diagnosis not present

## 2024-07-01 DIAGNOSIS — B370 Candidal stomatitis: Secondary | ICD-10-CM | POA: Diagnosis not present

## 2024-07-01 DIAGNOSIS — N1831 Chronic kidney disease, stage 3a: Secondary | ICD-10-CM

## 2024-07-01 MED ORDER — CLOTRIMAZOLE 10 MG MT TROC: 10.0000 mg | Freq: Every day | OROMUCOSAL | 5 refills | Status: DC

## 2024-07-01 MED ORDER — PANTOPRAZOLE SODIUM 40 MG PO TBEC: 40.0000 mg | DELAYED_RELEASE_TABLET | Freq: Two times a day (BID) | ORAL | 5 refills | Status: AC

## 2024-07-01 NOTE — Assessment & Plan Note (Signed)
 BP Readings from Last 3 Encounters:  07/01/24 130/82  06/18/24 116/69  04/23/24 110/76   Stable, pt to continue medical treatment - diet, wt control

## 2024-07-01 NOTE — Progress Notes (Signed)
 Patient ID: Andrea Mason, female   DOB: 08-13-1951, 74 y.o.   MRN: [id number]        Chief Complaint: follow up chronic anticoagulation, thrush, bilateral shoulder pain, low vit d       HPI:  Andrea Mason is a 73 y.o. female here with c/o eliquis  too expensive, can no longer afford, has been off for 3 wks.  Pt denies chest pain, increased sob or doe, wheezing, orthopnea, PND, increased LE swelling, palpitations, dizziness or syncope.  . Pt denies polydipsia, polyuria, or new focal neuro s/s.   Also has mild worsening thrush again, asking for tx.  Also had bilateral shoulder pain,mild sharp with any arm movements at the shoulder, after increased exercise with lifting light weights at home.         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 Mable   NASAL SINUS SURGERY  07/2006   Dr. Mable   TUBAL LIGATION      reports that she has never smoked. She has been exposed to tobacco smoke. She has never used smokeless  tobacco. She reports current alcohol use. She reports that she does not use drugs. family history includes Allergic rhinitis in her brother, brother, father, mother, and sister; Arrhythmia in her brother; Asthma in her mother and sister; Cancer in her mother; Colon cancer in her brother; Congenital heart disease in her brother; Diabetes in her sister; Heart attack in her brother; Lung disease in her son; Stroke in her brother. Allergies  Allergen Reactions   Iodine Nausea And Vomiting and Rash    Reaction: hot flashes   Ivp Dye [Iodinated Contrast Media] Nausea And Vomiting and Other (See Comments)    hot flashes   Promethazine -Codeine  Nausea And Vomiting   Tramadol  Other (See Comments)    Almost passed out   Current Outpatient Medications on File Prior to Visit  Medication Sig Dispense Refill   acetaminophen  (TYLENOL ) 325 MG tablet Per bottle as needed     albuterol  (VENTOLIN  HFA) 108 (90 Base) MCG/ACT inhaler Inhale 1-2 puffs into the lungs every 6 (six) hours as needed for wheezing or shortness of breath. 1 each 3   alendronate  (FOSAMAX ) 70 MG tablet TAKE 1 TABLET(70 MG) BY MOUTH EVERY 7 DAYS WITH A FULL GLASS OF WATER AND ON AN EMPTY  STOMACH 12 tablet 3   AMBULATORY NON FORMULARY MEDICATION Rollator walker with seat Dispense 1 Dx code: M54.16, R29.898 Use as needed 1 Device 0   aspirin  EC 81 MG tablet Take 1 tablet (81 mg total) by mouth daily. Swallow whole. 30 tablet 12   Biotin 89999 MCG TABS Take 10,000 mcg by mouth daily.     budeson-glycopyrrolate-formoterol  (BREZTRI  AEROSPHERE) 160-9-4.8 MCG/ACT AERO inhaler Inhale 2 puffs into the lungs 2 (two) times daily.     cetirizine  (ZYRTEC ) 10 MG tablet Take 10 mg by mouth at bedtime.      Cholecalciferol (VITAMIN D3) 2000 units TABS Take 2,000 Units by mouth daily.     Cyanocobalamin  (VITAMIN B-12 PO) Take 1,000 mcg by mouth daily.     famotidine  (PEPCID ) 40 MG tablet Take 1 tablet (40 mg total) by mouth at bedtime. 30 tablet 3    gabapentin  (NEURONTIN ) 100 MG capsule Take 1-3 capsules (100-300 mg total) by mouth 3 (three) times daily as needed. 90 capsule 3   ipratropium (ATROVENT ) 0.06 % nasal spray Place 2 sprays into both nostrils 3 (three) times daily. 15 mL 5   Mepolizumab  (NUCALA ) 100 MG/ML SOAJ Inject into the skin every 28 (twenty-eight) days.     montelukast  (SINGULAIR ) 10 MG tablet Take 1 tablet (10 mg total) by mouth at bedtime. 90 tablet 3   OVER THE COUNTER MEDICATION GO-LI     Respiratory Therapy Supplies (FLUTTER) DEVI Use as directed     sodium chloride  (OCEAN) 0.65 % SOLN nasal spray Place 2 sprays into both nostrils as needed (dry nose).      Spacer/Aero-Holding Raguel FRENCH by Does not apply route.     spironolactone  (ALDACTONE ) 25 MG tablet TAKE 1 TABLET(25 MG) BY MOUTH DAILY 90 tablet 1   Current Facility-Administered Medications on File Prior to Visit  Medication Dose Route Frequency Provider Last Rate Last Admin   mepolizumab  (NUCALA ) injection 100 mg  100 mg Subcutaneous Q28 days Iva Marty Saltness, MD   100 mg at 07/01/24 1341        ROS:  All others reviewed and negative.  Objective        PE:  BP 130/82   Pulse 71   Temp 97.7 F (36.5 C)   Ht 5' (1.524 m)   Wt 183 lb 9.6 oz (83.3 kg)   LMP  (LMP Unknown)   SpO2 96%   BMI 35.86 kg/m                 Constitutional: Pt appears in NAD               HENT: Head: NCAT.                Right Ear: External ear normal.                 Left Ear: External ear normal.                Eyes: . Pupils are equal, round, and reactive to light. Conjunctivae and EOM are normal               Nose: without d/c or deformity               Neck: Neck supple. Gross normal ROM               Cardiovascular: Normal rate and regular rhythm.  Pulmonary/Chest: Effort normal and breath sounds without rales or wheezing.                Abd:  Soft, NT, ND, + BS, no organomegaly               Neurological: Pt is alert. At baseline orientation,  motor grossly intact               Skin: Skin is warm. No rashes, no other new lesions, LE edema - none               Psychiatric: Pt behavior is normal without agitation   Micro: none  Cardiac tracings I have personally interpreted today:  none  Pertinent Radiological findings (summarize): none   Lab Results  Component Value Date   WBC 5.0 01/29/2024   HGB 11.6 (L) 01/29/2024   HCT 34.9 (L) 01/29/2024   PLT 210 01/29/2024   GLUCOSE 101 (H) 01/29/2024   CHOL 135 01/01/2024   TRIG 62.0 01/01/2024   HDL 69.70 01/01/2024   LDLCALC 53 01/01/2024   ALT 11 01/29/2024   AST 15 01/29/2024   NA 128 (L) 01/29/2024   K 4.8 01/29/2024   CL 94 (L) 01/29/2024   CREATININE 1.04 (H) 01/29/2024   BUN 10 01/29/2024   CO2 29 01/29/2024   TSH 0.99 01/01/2024   INR 1.0 12/26/2019   HGBA1C 5.9 01/01/2024   Assessment/Plan:  Andrea Mason is a 73 y.o. Black or African American [2] female with  has a past medical history of Allergic rhinitis, Allergy , Asthma, Depression, DJD (degenerative joint disease), cervical, DVT (deep venous thrombosis) (HCC), GERD (gastroesophageal reflux disease), History of colonic polyps, Hypertension, Obesity, Primary hyperaldosteronism (HCC) (11/06/2013), Stroke (HCC), and Syncope.  Long term (current) use of anticoagulants Unable to afford eliquis , pt now for urgent referral coumadin  clinic to restart  Embolic stroke Stable, cont coumadin  as above, declines statin  Vitamin D  deficiency Last vitamin D  Lab Results  Component Value Date   VD25OH 38.52 01/01/2024   Low, to start oral replacement   Hyperlipidemia Lab Results  Component Value Date   LDLCALC 53 01/01/2024   Stable, declines statin  Hyperglycemia Lab Results  Component Value Date   HGBA1C 5.9 01/01/2024   Stable, pt to continue current medical treatment  - diet, wt control   Essential hypertension BP Readings from Last 3 Encounters:  07/01/24 130/82  06/18/24 116/69  04/23/24  110/76   Stable, pt to continue medical treatment - diet, wt control   CKD (chronic kidney disease) stage 3, GFR 30-59 ml/min (HCC) Lab Results  Component Value Date   CREATININE 1.04 (H) 01/29/2024   Stable overall, cont to avoid nephrotoxins   Thrush Mild to mod, for clotrimazole  troche asd,  to f/u any worsening symptoms or concerns  Followup: Return in about 6 months (around 01/01/2025).  Lynwood Rush, MD 07/01/2024 8:34 PM [facility]

## 2024-07-01 NOTE — Patient Instructions (Signed)
 Ok to restart the clotrimazole   You will be contacted regarding the referral for: Coumadin  clinic instead of the eliquis   Please continue all other medications as before, and refills have been done if requested.  Please have the pharmacy call with any other refills you may need.  Please continue your efforts at being more active, low cholesterol diet, and weight control.  Please keep your appointments with your specialists as you may have planned  We can hold on lab testing today  Please make an Appointment to return in 6 months, or sooner if needed

## 2024-07-01 NOTE — Assessment & Plan Note (Signed)
 Last vitamin D Lab Results  Component Value Date   VD25OH 38.52 01/01/2024   Low, to start oral replacement

## 2024-07-01 NOTE — Assessment & Plan Note (Signed)
 Unable to afford eliquis , pt now for urgent referral coumadin  clinic to restart

## 2024-07-01 NOTE — Assessment & Plan Note (Signed)
 Stable, cont coumadin  as above, declines statin

## 2024-07-01 NOTE — Assessment & Plan Note (Signed)
 Mild to mod, for clotrimazole  troche asd,  to f/u any worsening symptoms or concerns

## 2024-07-01 NOTE — Assessment & Plan Note (Signed)
 Lab Results  Component Value Date   CREATININE 1.04 (H) 01/29/2024   Stable overall, cont to avoid nephrotoxins

## 2024-07-01 NOTE — Assessment & Plan Note (Signed)
 Lab Results  Component Value Date   HGBA1C 5.9 01/01/2024   Stable, pt to continue current medical treatment  - diet, wt control

## 2024-07-01 NOTE — Assessment & Plan Note (Signed)
 Lab Results  Component Value Date   LDLCALC 53 01/01/2024   Stable, declines statin

## 2024-07-02 ENCOUNTER — Telehealth: Payer: Self-pay | Admitting: Internal Medicine

## 2024-07-02 DIAGNOSIS — Z7901 Long term (current) use of anticoagulants: Secondary | ICD-10-CM

## 2024-07-02 MED ORDER — WARFARIN SODIUM 5 MG PO TABS: ORAL_TABLET | ORAL | 0 refills | Status: DC

## 2024-07-02 NOTE — Telephone Encounter (Signed)
 Pt returned call.  A full discussion of the nature of anticoagulants has been carried out.  A benefit risk analysis has been presented to the patient, so that they understand the justification for choosing anticoagulation at this time. The need for frequent and regular monitoring, precise dosage adjustment and compliance is stressed.  Side effects of potential bleeding are discussed.  The patient should avoid any OTC items containing aspirin  or ibuprofen, and should avoid great swings in general diet.  Avoid alcohol consumption.  Call if any signs of abnormal bleeding.    Advised also of benefit of using Lovenox  injections until therapeutic with warfarin. This will be cost prohibitive for the pt so no Lovenox  will be used.   Answered all questions and advised if any further questions feel free to contact coumadin  clinic. Provided coumadin  clinic direct number. Advised to take warfarin one tablet, every evening.   Advised pt it would be best to come to office for further education. Pt agreed and would like to come in on 8/1 at 3pm. Placed pt on coumadin  clinic schedule at [facility]. Pt verbalized understanding.   Sent in script for warfarin, 5 mg, QD.

## 2024-07-02 NOTE — Telephone Encounter (Signed)
 Pt has referral [id number] for ANTICOAGULATION MONITORING   Please assist if able.

## 2024-07-02 NOTE — Telephone Encounter (Signed)
 LVM for pt to return call

## 2024-07-04 ENCOUNTER — Ambulatory Visit: Admitting: Obstetrics and Gynecology

## 2024-07-04 ENCOUNTER — Encounter: Payer: Self-pay | Admitting: Obstetrics and Gynecology

## 2024-07-04 ENCOUNTER — Ambulatory Visit

## 2024-07-04 VITALS — BP 131/76 | HR 71 | Wt 183.0 lb

## 2024-07-04 DIAGNOSIS — Z7901 Long term (current) use of anticoagulants: Secondary | ICD-10-CM

## 2024-07-04 DIAGNOSIS — N3281 Overactive bladder: Secondary | ICD-10-CM | POA: Diagnosis not present

## 2024-07-04 DIAGNOSIS — N393 Stress incontinence (female) (male): Secondary | ICD-10-CM

## 2024-07-04 DIAGNOSIS — R350 Frequency of micturition: Secondary | ICD-10-CM

## 2024-07-04 LAB — POCT URINALYSIS DIP (CLINITEK)
Bilirubin, UA: NEGATIVE
Blood, UA: NEGATIVE
Glucose, UA: NEGATIVE mg/dL
Ketones, POC UA: NEGATIVE mg/dL
Leukocytes, UA: NEGATIVE
Nitrite, UA: NEGATIVE
POC PROTEIN,UA: NEGATIVE
Spec Grav, UA: <=1.005 — AB (ref 1.010–1.025)
Urobilinogen, UA: 0.2 U/dL
pH, UA: 6.5 (ref 5.0–8.0)

## 2024-07-04 MED ORDER — TROSPIUM CHLORIDE 20 MG PO TABS
20.0000 mg | ORAL_TABLET | Freq: Two times a day (BID) | ORAL | 5 refills | Status: DC
Start: 2024-07-04 — End: 2024-08-12

## 2024-07-04 NOTE — Patient Instructions (Signed)

## 2024-07-04 NOTE — Progress Notes (Signed)
 Indication is embolic stroke. Pt was taking Eliquis  but it became cost prohibitive. Pt stopped taking it a couple of months ago. Pt is in today for warfarin education after being educated on phone. No POCT INR was performed today since pt has not started warfarin yet. Start taking warfarin this evening. Next apt is 8/5 for INR check. A full discussion of the nature of anticoagulants has been carried out.  A benefit risk analysis has been presented to the patient, so that they understand the justification for choosing anticoagulation at this time. The need for frequent and regular monitoring, precise dosage adjustment and compliance is stressed.  Side effects of potential bleeding are discussed.  The patient should avoid any OTC items containing aspirin  or ibuprofen, and should avoid great swings in general diet.  Avoid alcohol consumption.  Call if any signs of abnormal bleeding.   Pt was also provided with a Pharmacologist book and website information and other written educational material.

## 2024-07-04 NOTE — Assessment & Plan Note (Signed)
-   rare, not bothersome, will defer treatment at this time.
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
## 2024-07-04 NOTE — Addendum Note (Signed)
 Addended by: ELANA SOTERO RAMAN on: 07/04/2024 04:29 PM   Modules accepted: Orders

## 2024-07-04 NOTE — Patient Instructions (Addendum)
 Pre visit review using our clinic review tool, if applicable. No additional management support is needed unless otherwise documented below in the visit note.  Start taking warfarin this evening.

## 2024-07-04 NOTE — Assessment & Plan Note (Signed)
-   We discussed the symptoms of overactive bladder (OAB), which include urinary urgency, urinary frequency, nocturia, with or without urge incontinence.  While we do not know the exact etiology of OAB, several treatment options exist. We discussed management including behavioral therapy (decreasing bladder irritants, urge suppression strategies, timed voids, bladder retraining), physical therapy, medication; for refractory cases posterior tibial nerve stimulation, sacral neuromodulation, and intravesical botulinum toxin injection.  - Prescribed trospium 20mg  BID. For anticholinergic medications, we discussed the potential side effects of anticholinergics including dry eyes, dry mouth, constipation, cognitive impairment and urinary retention. - Also advised to decrease caffeine and intake of other bladder irritants. Avoid drinking at least 2 hours prior to bedtime.  - Pt sees Pulm- will discuss possible sleep study since she is waking frequently and has been told that she snores.

## 2024-07-08 ENCOUNTER — Ambulatory Visit (INDEPENDENT_AMBULATORY_CARE_PROVIDER_SITE_OTHER)

## 2024-07-08 DIAGNOSIS — Z7901 Long term (current) use of anticoagulants: Secondary | ICD-10-CM | POA: Diagnosis not present

## 2024-07-08 LAB — POCT INR: INR: 1.3 — AB (ref 2.0–3.0)

## 2024-07-08 NOTE — Progress Notes (Signed)
 Indication is embolic stroke. Pt was taking Eliquis  but it became cost prohibitive. Pt stopped taking it a couple of months ago. Pt was started on warfarin on 8/1. Increase dose today to take 1 1/2 tablets and then change weekly dose to take 1 tablet daily except take 1 1/2 tablets on Monday and Thursday. Recheck on 8/8.  Advised to watch for s/s of abnormal bruising or bleeding and if any s/s to go to ER. Pt verbalized understanding.

## 2024-07-08 NOTE — Patient Instructions (Addendum)
 Pre visit review using our clinic review tool, if applicable. No additional management support is needed unless otherwise documented below in the visit note.   Increase dose today to take 1 1/2 tablets and then change weekly dose to take 1 tablet daily except take 1 1/2 tablets on Monday and Thursday. Recheck on 8/8.

## 2024-07-09 ENCOUNTER — Telehealth: Payer: Self-pay

## 2024-07-09 NOTE — Telephone Encounter (Signed)
 Pt is scheduled for coumadin  clinic for 8/8 in the morning. She prefers an afternoon apt but no apts were available.  Apts for the afternoon of 8/8 have opened. Tried to contact pt to inquire if she would like to move apt to afternoon. No answer and VM is full.

## 2024-07-09 NOTE — Telephone Encounter (Signed)
 Pt returned call. Advised apts opened on 8/8 afternoon for the coumadin  clinic. She would like her apt changed to 3:30. She reports she forgot to take the 1/2 tablet last night and only took 1 tablet. Advised to take 1 1/2 today and 1 1/2 tomorrow and rescheduled her apt for 3:30.

## 2024-07-11 ENCOUNTER — Ambulatory Visit

## 2024-07-11 DIAGNOSIS — Z7901 Long term (current) use of anticoagulants: Secondary | ICD-10-CM

## 2024-07-11 LAB — POCT INR: INR: 3.1 — AB (ref 2.0–3.0)

## 2024-07-11 NOTE — Progress Notes (Signed)
 Indication is embolic stroke. Pt was taking Eliquis  but it became cost prohibitive. Pt stopped taking it a couple of months ago. Pt was started on warfarin on 8/1. Reduce dose today to take 1/2 tablet and then change weekly dose to take 1 tablet daily except take 1 1/2 tablets on Monday. Recheck on 8/15.  Advised to watch for s/s of abnormal bruising or bleeding and if any s/s to go to ER. Pt verbalized understanding.

## 2024-07-11 NOTE — Patient Instructions (Addendum)
 Pre visit review using our clinic review tool, if applicable. No additional management support is needed unless otherwise documented below in the visit note.  Reduce dose today to take 1/2 tablet and then change weekly dose to take 1 tablet daily except take 1 1/2 tablets on Monday. Recheck on 8/15.

## 2024-07-16 ENCOUNTER — Other Ambulatory Visit: Payer: Self-pay | Admitting: Hematology and Oncology

## 2024-07-16 ENCOUNTER — Inpatient Hospital Stay: Payer: Medicare HMO | Attending: Hematology and Oncology

## 2024-07-16 ENCOUNTER — Inpatient Hospital Stay (HOSPITAL_BASED_OUTPATIENT_CLINIC_OR_DEPARTMENT_OTHER): Payer: Medicare HMO | Admitting: Hematology and Oncology

## 2024-07-16 VITALS — BP 146/81 | HR 68 | Temp 98.0°F | Resp 14 | Wt 183.9 lb

## 2024-07-16 DIAGNOSIS — Z86718 Personal history of other venous thrombosis and embolism: Secondary | ICD-10-CM

## 2024-07-16 DIAGNOSIS — I82402 Acute embolism and thrombosis of unspecified deep veins of left lower extremity: Secondary | ICD-10-CM

## 2024-07-16 DIAGNOSIS — Z7901 Long term (current) use of anticoagulants: Secondary | ICD-10-CM | POA: Insufficient documentation

## 2024-07-16 DIAGNOSIS — Z800 Family history of malignant neoplasm of digestive organs: Secondary | ICD-10-CM | POA: Insufficient documentation

## 2024-07-16 LAB — CBC WITH DIFFERENTIAL (CANCER CENTER ONLY)
Abs Immature Granulocytes: 0.01 K/uL (ref 0.00–0.07)
Basophils Absolute: 0.1 K/uL (ref 0.0–0.1)
Basophils Relative: 1 %
Eosinophils Absolute: 0.1 K/uL (ref 0.0–0.5)
Eosinophils Relative: 2 %
HCT: 33.7 % — ABNORMAL LOW (ref 36.0–46.0)
Hemoglobin: 11.3 g/dL — ABNORMAL LOW (ref 12.0–15.0)
Immature Granulocytes: 0 %
Lymphocytes Relative: 40 %
Lymphs Abs: 1.8 K/uL (ref 0.7–4.0)
MCH: 29.9 pg (ref 26.0–34.0)
MCHC: 33.5 g/dL (ref 30.0–36.0)
MCV: 89.2 fL (ref 80.0–100.0)
Monocytes Absolute: 0.4 K/uL (ref 0.1–1.0)
Monocytes Relative: 9 %
Neutro Abs: 2.1 K/uL (ref 1.7–7.7)
Neutrophils Relative %: 48 %
Platelet Count: 230 K/uL (ref 150–400)
RBC: 3.78 MIL/uL — ABNORMAL LOW (ref 3.87–5.11)
RDW: 12.1 % (ref 11.5–15.5)
WBC Count: 4.5 K/uL (ref 4.0–10.5)
nRBC: 0 % (ref 0.0–0.2)

## 2024-07-16 LAB — CMP (CANCER CENTER ONLY)
ALT: 10 U/L (ref 0–44)
AST: 19 U/L (ref 15–41)
Albumin: 4.2 g/dL (ref 3.5–5.0)
Alkaline Phosphatase: 75 U/L (ref 38–126)
Anion gap: 4 — ABNORMAL LOW (ref 5–15)
BUN: 11 mg/dL (ref 8–23)
CO2: 29 mmol/L (ref 22–32)
Calcium: 9.2 mg/dL (ref 8.9–10.3)
Chloride: 102 mmol/L (ref 98–111)
Creatinine: 0.98 mg/dL (ref 0.44–1.00)
GFR, Estimated: >60 mL/min (ref >60)
Glucose, Bld: 76 mg/dL (ref 70–99)
Potassium: 4.6 mmol/L (ref 3.5–5.1)
Sodium: 135 mmol/L (ref 135–145)
Total Bilirubin: 0.6 mg/dL (ref 0.0–1.2)
Total Protein: 7.4 g/dL (ref 6.5–8.1)

## 2024-07-16 NOTE — Progress Notes (Signed)
 [facility] [phone]   [phone]  PROGRESS NOTE  Patient Care Team: Norleen Lynwood ORN, MD as PCP - General Rosemarie Eather RAMAN, MD as Consulting Physician (Neurology)  Hematological/Oncological History 08/24/2022:Presented with left lower extremity swelling x 2-3 months. Doppler US  showed acute and occlusive deep vein thrombosis involving the common femoral vein, femoral vein, popliteal vein, posterior  tibial veins, and peroneal veins.  The external iliac vein is consistent with age indeterminate and partially recanalized thrombus. Started on Eliquis  therapy 09/21/2022: Doppler US : Findings consistent with age indeterminate deep vein thrombosis  involving the left common femoral vein, SF junction, left femoral vein,  left proximal profunda vein, left popliteal vein, and left peroneal veins.  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 When compared to prior studies,  thrombus in the common femoral, saphenofemoral junction, posterior tibial  veins, and peroneal veins appears to have resolved.  01/24/2023: Establish care with [facility] 04/24/2023: Transitioned to Eliquis  2.5 mg PO twice daily  CHIEF COMPLAINTS/PURPOSE OF CONSULTATION:  Left lower extremity DVT  HISTORY OF PRESENTING ILLNESS:  Andrea Mason 73 y.o. female returns for history of left lower extremity DVT. She is unaccompanied for this visit.   On exam today, Ms. Roldan reports she had to transition to Coumadin  therapy because the Eliquis  was costing too much.  Is costing $45 per month.  She reports when her finances get back into hand she will transition back to Eliquis .  She reports that she has her levels checked on Fridays and overall has been in good shape.  Her INRs have been on target.  She reports that she does have an overactive bladder and she started oxybutynin  for this.  It has been an  issue.  The a Coumadin  is not causing any bleeding, bruising, or dark stools.  She is having some occasional cramps in her feet.  She denies any constipation and is not having any nausea, vomiting, or diarrhea.  She notes that she recently had a colonoscopy which looked good.  Overall she feels well and has no additional questions concerns or complaints today.  A full 10 point ROS is otherwise negative.  MEDICAL HISTORY:  Past Medical History:  Diagnosis Date   Allergic rhinitis    Allergy     Asthma    DJD (degenerative joint disease), cervical    DVT (deep venous thrombosis) (HCC)    GERD (gastroesophageal reflux disease)    History of colonic polyps    Hypertension    Obesity    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   Stroke [facility])    Syncope    2014    SURGICAL HISTORY: Past Surgical History:  Procedure Laterality Date   COLONOSCOPY     FOOT SURGERY  1998   Implantable loop recorder placement  09/16/14   MDT LINQ implanted by Dr Kelsie for cryptogenic stroke, RIO II protocol   LOOP RECORDER REMOVAL N/A 10/02/2017   Procedure: LOOP RECORDER REMOVAL;  Surgeon: Kelsie Lynwood, MD;  Location: [facility] INVASIVE CV LAB;  Service: Cardiovascular;  Laterality: N/A;   NASAL POLYP SURGERY  07/2006   x 2 with Dr. Mable   NASAL SINUS SURGERY  07/2006   Dr. Mable   TUBAL LIGATION      SOCIAL HISTORY: Social History   Socioeconomic History   Marital status: Widowed    Spouse name: Not on file   Number of children: 3   Years of  education: Not on file   Highest education level: Not on file  Occupational History   Occupation: Museum/gallery curator    Comment: [employer]   Occupation: direct Patent examiner  Tobacco Use   Smoking status: Never    Passive exposure: Current (on sister's clothing bc sister is a smoker)   Smokeless tobacco: Never  Vaping Use   Vaping status: Never Used  Substance and Sexual Activity   Alcohol use: Yes    Comment: socially wine   Drug use: No    Sexual activity: Not Currently    Birth control/protection: Post-menopausal  Other Topics Concern   Not on file  Social History Narrative   Widowed, husband died in 08-22-2006   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   Patient lives alone.   Social Drivers of Corporate investment banker Strain: Low Risk  (10/19/2023)   Overall Financial Resource Strain (CARDIA)    Difficulty of Paying Living Expenses: Not hard at all  Food Insecurity: No Food Insecurity (10/19/2023)   Hunger Vital Sign    Worried About Running Out of Food in the Last Year: Never true    Ran Out of Food in the Last Year: Never true  Transportation Needs: No Transportation Needs (10/19/2023)   PRAPARE - Administrator, Civil Service (Medical): No    Lack of Transportation (Non-Medical): No  Physical Activity: Inactive (10/19/2023)   Exercise Vital Sign    Days of Exercise per Week: 0 days    Minutes of Exercise per Session: 0 min  Stress: No Stress Concern Present (10/19/2023)   [employer] of Occupational Health - Occupational Stress Questionnaire    Feeling of Stress : Not at all  Social Connections: Moderately Integrated (10/19/2023)   Social Connection and Isolation Panel    Frequency of Communication with Friends and Family: More than three times a week    Frequency of Social Gatherings with Friends and Family: Twice a week    Attends Religious Services: More than 4 times per year    Active Member of [employer] or Organizations: Yes    Attends Banker Meetings: More than 4 times per year    Marital Status: Widowed  Intimate Partner Violence: Not At Risk (10/19/2023)   Humiliation, Afraid, Rape, and Kick questionnaire    Fear of Current or Ex-Partner: No    Emotionally Abused: No    Physically Abused: No    Sexually Abused: No    FAMILY HISTORY: Family History  Problem Relation Age of Onset   Asthma Mother    Allergic  rhinitis Mother    Cancer Mother    Dementia Mother    Allergic rhinitis Father    Asthma Sister    Allergic rhinitis Sister    Diabetes Sister    Allergic rhinitis Brother    Colon cancer Brother    Stroke Brother    Arrhythmia Brother    Allergic rhinitis Brother    Congenital heart disease Brother    Heart attack Brother    Lung disease Son        Sarcoid vs ARDS related fibrosis, required 2 double lung transplants   Esophageal cancer Neg Hx    Rectal cancer Neg Hx    Stomach cancer Neg Hx     ALLERGIES:  is allergic to iodine, ivp dye [iodinated contrast media], promethazine -codeine , and tramadol .  MEDICATIONS:  Current Outpatient Medications  Medication Sig Dispense Refill   acetaminophen  (TYLENOL ) 325 MG tablet Per bottle as needed     albuterol  (VENTOLIN  HFA) 108 (90 Base) MCG/ACT inhaler Inhale 1-2 puffs into the lungs every 6 (six) hours as needed for wheezing or shortness of breath. 1 each 3   alendronate  (FOSAMAX ) 70 MG tablet TAKE 1 TABLET(70 MG) BY MOUTH EVERY 7 DAYS WITH A FULL GLASS OF WATER AND ON AN EMPTY STOMACH 12 tablet 3   AMBULATORY NON FORMULARY MEDICATION Rollator walker with seat Dispense 1 Dx code: M54.16, R29.898 Use as needed 1 Device 0   aspirin  EC 81 MG tablet Take 1 tablet (81 mg total) by mouth daily. Swallow whole. 30 tablet 12   Biotin 89999 MCG TABS Take 10,000 mcg by mouth daily.     budeson-glycopyrrolate-formoterol  (BREZTRI  AEROSPHERE) 160-9-4.8 MCG/ACT AERO inhaler Inhale 2 puffs into the lungs 2 (two) times daily.     cetirizine  (ZYRTEC ) 10 MG tablet Take 10 mg by mouth at bedtime.      Cholecalciferol (VITAMIN D3) 2000 units TABS Take 2,000 Units by mouth daily.     clotrimazole  (MYCELEX ) 10 MG troche Take 1 tablet (10 mg total) by mouth 5 (five) times daily. 30 Troche 5   Cyanocobalamin  (VITAMIN B-12 PO) Take 1,000 mcg by mouth daily.     famotidine  (PEPCID ) 40 MG tablet Take 1 tablet (40 mg total) by mouth at bedtime. 30 tablet 3    gabapentin  (NEURONTIN ) 100 MG capsule Take 1-3 capsules (100-300 mg total) by mouth 3 (three) times daily as needed. 90 capsule 3   ipratropium (ATROVENT ) 0.06 % nasal spray Place 2 sprays into both nostrils 3 (three) times daily. 15 mL 5   Mepolizumab  (NUCALA ) 100 MG/ML SOAJ Inject into the skin every 28 (twenty-eight) days.     montelukast  (SINGULAIR ) 10 MG tablet Take 1 tablet (10 mg total) by mouth at bedtime. 90 tablet 3   OVER THE COUNTER MEDICATION GO-LI     pantoprazole  (PROTONIX ) 40 MG tablet Take 1 tablet (40 mg total) by mouth 2 (two) times daily. 60 tablet 5   Respiratory Therapy Supplies (FLUTTER) DEVI Use as directed     sodium chloride  (OCEAN) 0.65 % SOLN nasal spray Place 2 sprays into both nostrils as needed (dry nose).      Spacer/Aero-Holding Raguel FRENCH by Does not apply route.     spironolactone  (ALDACTONE ) 25 MG tablet TAKE 1 TABLET(25 MG) BY MOUTH DAILY 90 tablet 1   trospium  (SANCTURA ) 20 MG tablet Take 1 tablet (20 mg total) by mouth 2 (two) times daily. 60 tablet 5   warfarin (COUMADIN ) 5 MG tablet TAKE 1 TABLET BY MOUTH DAILY OR AS DIRECTED BY ANTICOAGULATION CLINIC 35 tablet 0   Current Facility-Administered Medications  Medication Dose Route Frequency Provider Last Rate Last Admin   mepolizumab  (NUCALA ) injection 100 mg  100 mg Subcutaneous Q28 days Iva Marty Saltness, MD   100 mg at 07/01/24 1341    REVIEW OF SYSTEMS:   Constitutional: ( - ) fevers, ( - )  chills , ( - ) night sweats Eyes: ( - ) blurriness of vision, ( - ) double vision, ( - ) watery eyes Ears, nose, mouth, throat, and face: ( - ) mucositis, ( - ) sore throat Respiratory: ( - ) cough, ( - ) dyspnea, ( - ) wheezes Cardiovascular: ( - ) palpitation, ( - ) chest discomfort, ( - ) lower extremity swelling Gastrointestinal:  ( - ) nausea, ( - )  heartburn, ( - ) change in bowel habits Skin: ( - ) abnormal skin rashes Lymphatics: ( - ) new lymphadenopathy, ( - ) easy bruising Neurological: ( - )  numbness, ( - ) tingling, ( - ) new weaknesses Behavioral/Psych: ( - ) mood change, ( - ) new changes  All other systems were reviewed with the patient and are negative.  PHYSICAL EXAMINATION: ECOG PERFORMANCE STATUS: 0 - Asymptomatic  Vitals:   07/16/24 1508  BP: (!) 146/81  Pulse: 68  Resp: 14  Temp: 98 F (36.7 C)  SpO2: 98%    Filed Weights   07/16/24 1508  Weight: 183 lb 14.4 oz (83.4 kg)     GENERAL: well appearing female in NAD  SKIN: skin color, texture, turgor are normal, no rashes or significant lesions EYES: conjunctiva are pink and non-injected, sclera clear LUNGS: clear to auscultation and percussion with normal breathing effort HEART: regular rate & rhythm and no murmurs and no lower extremity edema Musculoskeletal: no cyanosis of digits and no clubbing  PSYCH: alert & oriented x 3, fluent speech NEURO: no focal motor/sensory deficits  LABORATORY DATA:  I have reviewed the data as listed    Latest Ref Rng & Units 07/18/2024    4:14 PM 07/16/2024    2:48 PM 01/29/2024    2:34 PM  CBC  WBC 4.0 - 10.5 K/uL 3.6  4.5  5.0   Hemoglobin 12.0 - 15.0 g/dL 88.6  88.6  88.3   Hematocrit 36.0 - 46.0 % 34.5  33.7  34.9   Platelets 150.0 - 400.0 K/uL 222.0  230  210        Latest Ref Rng & Units 07/18/2024    4:14 PM 07/16/2024    2:48 PM 01/29/2024    2:34 PM  CMP  Glucose 70 - 99 mg/dL 84  76  898   BUN 6 - 23 mg/dL 10  11  10    Creatinine 0.40 - 1.20 mg/dL 8.99  9.01  8.95   Sodium 135 - 145 mEq/L 136  135  128   Potassium 3.5 - 5.1 mEq/L 3.8  4.6  4.8   Chloride 96 - 112 mEq/L 104  102  94   CO2 19 - 32 mEq/L 24  29  29    Calcium  8.4 - 10.5 mg/dL 9.1  9.2  9.6   Total Protein 6.0 - 8.3 g/dL 7.1  7.4  7.1   Total Bilirubin 0.2 - 1.2 mg/dL 0.6  0.6  1.1   Alkaline Phos 39 - 117 U/L 67  75  60   AST 0 - 37 U/L 17  19  15    ALT 0 - 35 U/L 7  10  11      RADIOGRAPHIC STUDIES: I have personally reviewed the radiological images as listed and agreed with the  findings in the report. No results found.   ASSESSMENT & PLAN Latrina Guttman is a 73 y.o. female who presents for a follow up for history of left lower extremity DVT diagnosed in September 2023.   #Unprovoked LLE DVT --findings at this time are consistent with a unprovoked VTE --Workup from 01/24/2023 showed no evidence of antiphospholipid syndrome.  --Labs today reviewed and require no intervention. WBC 3.6, hemoglobin 1.3, MCV 90.1, platelets 222.  Creatinine 1.00, LFTs normal.  --Currently on coumadin  5 mg PO daily.  Transition to Coumadin  due to cost.  Will plan to return to Eliquis  therapy once her finances have returned to normal. --patient denies  any bleeding, bruising, or dark stools on this medication. It is well tolerated. No difficulties accessing/affording the medication --RTC in 6 months' time with strict return precautions for overt signs of bleeding.   No orders of the defined types were placed in this encounter.  All questions were answered. The patient knows to call the clinic with any problems, questions or concerns.  I have spent a total of 25 minutes minutes of face-to-face and non-face-to-face time, preparing to see the patient,  performing a medically appropriate examination, counseling and educating the patient,documenting clinical information in the electronic health record,  and care coordination.   Norleen IVAR Kidney, MD Department of Hematology/Oncology [facility] at [facility] Phone: [phone] Pager: [phone] Email: [email or web address]

## 2024-07-18 ENCOUNTER — Other Ambulatory Visit: Payer: Self-pay | Admitting: Internal Medicine

## 2024-07-18 ENCOUNTER — Ambulatory Visit (INDEPENDENT_AMBULATORY_CARE_PROVIDER_SITE_OTHER)

## 2024-07-18 ENCOUNTER — Ambulatory Visit: Payer: Self-pay | Admitting: Internal Medicine

## 2024-07-18 ENCOUNTER — Telehealth: Payer: Self-pay

## 2024-07-18 ENCOUNTER — Other Ambulatory Visit (INDEPENDENT_AMBULATORY_CARE_PROVIDER_SITE_OTHER)

## 2024-07-18 DIAGNOSIS — E559 Vitamin D deficiency, unspecified: Secondary | ICD-10-CM

## 2024-07-18 DIAGNOSIS — Z7901 Long term (current) use of anticoagulants: Secondary | ICD-10-CM

## 2024-07-18 DIAGNOSIS — R739 Hyperglycemia, unspecified: Secondary | ICD-10-CM

## 2024-07-18 DIAGNOSIS — E785 Hyperlipidemia, unspecified: Secondary | ICD-10-CM | POA: Diagnosis not present

## 2024-07-18 LAB — CBC WITH DIFFERENTIAL/PLATELET
Basophils Absolute: 0 K/uL (ref 0.0–0.1)
Basophils Relative: 1.1 % (ref 0.0–3.0)
Eosinophils Absolute: 0.1 K/uL (ref 0.0–0.7)
Eosinophils Relative: 2.6 % (ref 0.0–5.0)
HCT: 34.5 % — ABNORMAL LOW (ref 36.0–46.0)
Hemoglobin: 11.3 g/dL — ABNORMAL LOW (ref 12.0–15.0)
Lymphocytes Relative: 41.9 % (ref 12.0–46.0)
Lymphs Abs: 1.5 K/uL (ref 0.7–4.0)
MCHC: 32.7 g/dL (ref 30.0–36.0)
MCV: 90.1 fl (ref 78.0–100.0)
Monocytes Absolute: 0.4 K/uL (ref 0.1–1.0)
Monocytes Relative: 9.8 % (ref 3.0–12.0)
Neutro Abs: 1.6 K/uL (ref 1.4–7.7)
Neutrophils Relative %: 44.6 % (ref 43.0–77.0)
Platelets: 222 K/uL (ref 150.0–400.0)
RBC: 3.83 Mil/uL — ABNORMAL LOW (ref 3.87–5.11)
RDW: 12.9 % (ref 11.5–15.5)
WBC: 3.6 K/uL — ABNORMAL LOW (ref 4.0–10.5)

## 2024-07-18 LAB — BASIC METABOLIC PANEL WITH GFR
BUN: 10 mg/dL (ref 6–23)
CO2: 24 meq/L (ref 19–32)
Calcium: 9.1 mg/dL (ref 8.4–10.5)
Chloride: 104 meq/L (ref 96–112)
Creatinine, Ser: 1 mg/dL (ref 0.40–1.20)
GFR: 56.67 mL/min — ABNORMAL LOW (ref >60.00)
Glucose, Bld: 84 mg/dL (ref 70–99)
Potassium: 3.8 meq/L (ref 3.5–5.1)
Sodium: 136 meq/L (ref 135–145)

## 2024-07-18 LAB — LIPID PANEL
Cholesterol: 145 mg/dL (ref 0–200)
HDL: 57.3 mg/dL (ref >39.00)
LDL Cholesterol: 74 mg/dL (ref 0–99)
NonHDL: 87.27
Total CHOL/HDL Ratio: 3
Triglycerides: 66 mg/dL (ref 0.0–149.0)
VLDL: 13.2 mg/dL (ref 0.0–40.0)

## 2024-07-18 LAB — HEPATIC FUNCTION PANEL
ALT: 7 U/L (ref 0–35)
AST: 17 U/L (ref 0–37)
Albumin: 4 g/dL (ref 3.5–5.2)
Alkaline Phosphatase: 67 U/L (ref 39–117)
Bilirubin, Direct: 0.1 mg/dL (ref 0.0–0.3)
Total Bilirubin: 0.6 mg/dL (ref 0.2–1.2)
Total Protein: 7.1 g/dL (ref 6.0–8.3)

## 2024-07-18 LAB — POCT INR: INR: 7.4 — AB (ref 2.0–3.0)

## 2024-07-18 LAB — HEMOGLOBIN A1C: Hgb A1c MFr Bld: 6.1 % (ref 4.6–6.5)

## 2024-07-18 LAB — TSH: TSH: 1.55 u[IU]/mL (ref 0.35–5.50)

## 2024-07-18 LAB — VITAMIN D 25 HYDROXY (VIT D DEFICIENCY, FRACTURES): VITD: 43.12 ng/mL (ref 30.00–100.00)

## 2024-07-18 LAB — PROTIME-INR
INR: 9.6 ratio (ref 0.8–1.0)
Prothrombin Time: 92 s (ref 9.6–13.1)

## 2024-07-18 NOTE — Progress Notes (Addendum)
 Indication is embolic stroke. Pt was taking Eliquis  but it became cost prohibitive. Pt stopped taking it a couple of months ago. Pt was started on warfarin on 8/1. Pt denies any bleeding or abnormal bruising. Educated pt on what abnormal bruising and bleeding. Advised if any bleeding or abnormal bruising to go to the ER. Advised pt to follow instructions given in coumadin  clinic and if any changes are needed she would receive a phone call. Provided clinic number to pt and advised if she has any questions there are triage nurses on that number all weekend long. Pt verbalized understanding.  Hold dose today, tomorrow, and Sunday. Take 1/2 tablet on Monday. Recheck on 8/19.  Advised to watch for s/s of abnormal bruising or bleeding and if any s/s to go to ER. Pt verbalized understanding.  Placed a STAT INR order. Sent pt to lab downstairs.   Lab downstairs did not draw the INR. They reported they did not see the order. Pt is a difficult stick and had been stuck twice already. She is going to the main lab to have the STAT INR drawn right now.

## 2024-07-18 NOTE — Telephone Encounter (Signed)
 This RN received critical lab values for patient, INR 9.6 and PT 92.0, read back values. Received results from [facility] at [facility] lab.   This RN contacted On call provider Dr. Rilla to report these critical values, provider had no further instructions. Will route to clinic as High Priority for follow up.    Copied from CRM #[id number]. Topic: Clinical - Lab/Test Results >> Jul 18, 2024  5:13 PM [place] J wrote: Reason for CRM: Representative calling to relay critical lab results.

## 2024-07-18 NOTE — Telephone Encounter (Signed)
 Lab INR 9.6.  Spoke with patient. No signs of bruising/bleeding. Last coumadin  dose was yesterday 8/14.  No extra doses.  Rec increase leafy green vegetables next 1-2 days.   Warfarin has been held today through Sunday with 1/2 dose on Monday and plan to recheck Tuesday which is reasonable.  ER precautions reviewed.  No vit K at this time.

## 2024-07-18 NOTE — Patient Instructions (Addendum)
 Pre visit review using our clinic review tool, if applicable. No additional management support is needed unless otherwise documented below in the visit note.  Hold dose today, tomorrow, and Sunday. Take 1/2 tablet on Monday. Recheck on 8/19.

## 2024-07-18 NOTE — Progress Notes (Signed)
 The test results show that your current treatment is OK, as the tests are stable.  Please continue the same plan.  There is no other need for change of treatment or further evaluation based on these results, at this time.  thanks

## 2024-07-21 NOTE — Telephone Encounter (Signed)
 Noted. Pt will be in tomorrow for INR check.

## 2024-07-22 ENCOUNTER — Ambulatory Visit (INDEPENDENT_AMBULATORY_CARE_PROVIDER_SITE_OTHER)

## 2024-07-22 DIAGNOSIS — Z7901 Long term (current) use of anticoagulants: Secondary | ICD-10-CM | POA: Diagnosis not present

## 2024-07-22 LAB — POCT INR: INR: 2.2 (ref 2.0–3.0)

## 2024-07-22 NOTE — Patient Instructions (Addendum)
 Pre visit review using our clinic review tool, if applicable. No additional management support is needed unless otherwise documented below in the visit note.  Continue 1/2 tablet daily. Recheck in 1 week.

## 2024-07-22 NOTE — Progress Notes (Signed)
 Indication is embolic stroke. Pt was taking Eliquis  but it became cost prohibitive. Pt stopped taking it a couple of months ago. Pt was started on warfarin on 8/1. Pt had INR check on 8/15 and POCT INR was 7.4, f/u lab INR was 9.6. Pt was advised to hold all warfarin for 3 days and then restart yesterday by taking 1/2 tablet. Hold dose today, tomorrow, and Sunday. Take 1/2 tablet on Monday. Recheck on 8/19. On call provider spoke with pt on 8/15 after lab INR returned after hours and agreed with instructions given in coumadin  clinic.  Pt denies any bleeding or abnormal bruising. Continue 1/2 tablet daily. Recheck in 1 week.  Advised to watch for s/s of abnormal bruising or bleeding and if any s/s to go to ER. Pt verbalized understanding.   Pt requested review of her recent lab results. Reviewed and explained lab results. Pt verbalized understanding.

## 2024-07-27 ENCOUNTER — Other Ambulatory Visit: Payer: Self-pay | Admitting: Internal Medicine

## 2024-07-29 ENCOUNTER — Ambulatory Visit (INDEPENDENT_AMBULATORY_CARE_PROVIDER_SITE_OTHER)

## 2024-07-29 ENCOUNTER — Other Ambulatory Visit: Payer: Self-pay | Admitting: Internal Medicine

## 2024-07-29 DIAGNOSIS — J4550 Severe persistent asthma, uncomplicated: Secondary | ICD-10-CM | POA: Diagnosis not present

## 2024-07-29 DIAGNOSIS — Z7901 Long term (current) use of anticoagulants: Secondary | ICD-10-CM

## 2024-07-29 LAB — POCT INR: INR: 2.1 (ref 2.0–3.0)

## 2024-07-29 NOTE — Progress Notes (Signed)
 Indication is embolic stroke. Pt was taking Eliquis  but it became cost prohibitive. Pt stopped taking it a couple of months ago. Pt was started on warfarin on 8/1. Continue 1/2 tablet daily. Recheck in 2 week.

## 2024-07-29 NOTE — Patient Instructions (Addendum)
 Pre visit review using our clinic review tool, if applicable. No additional management support is needed unless otherwise documented below in the visit note.  Continue 1/2 tablet daily. Recheck in 1 week.

## 2024-08-12 ENCOUNTER — Encounter: Payer: Self-pay | Admitting: Allergy & Immunology

## 2024-08-12 ENCOUNTER — Other Ambulatory Visit: Payer: Self-pay

## 2024-08-12 ENCOUNTER — Ambulatory Visit (INDEPENDENT_AMBULATORY_CARE_PROVIDER_SITE_OTHER)

## 2024-08-12 ENCOUNTER — Ambulatory Visit (INDEPENDENT_AMBULATORY_CARE_PROVIDER_SITE_OTHER): Admitting: Obstetrics and Gynecology

## 2024-08-12 ENCOUNTER — Encounter: Payer: Self-pay | Admitting: Obstetrics and Gynecology

## 2024-08-12 ENCOUNTER — Ambulatory Visit (INDEPENDENT_AMBULATORY_CARE_PROVIDER_SITE_OTHER): Admitting: Allergy & Immunology

## 2024-08-12 ENCOUNTER — Telehealth: Payer: Self-pay

## 2024-08-12 VITALS — BP 130/70 | HR 78 | Temp 98.1°F | Resp 18 | Ht 60.63 in | Wt 183.9 lb

## 2024-08-12 DIAGNOSIS — Z7901 Long term (current) use of anticoagulants: Secondary | ICD-10-CM

## 2024-08-12 DIAGNOSIS — J4550 Severe persistent asthma, uncomplicated: Secondary | ICD-10-CM | POA: Diagnosis not present

## 2024-08-12 DIAGNOSIS — J339 Nasal polyp, unspecified: Secondary | ICD-10-CM | POA: Diagnosis not present

## 2024-08-12 DIAGNOSIS — J3089 Other allergic rhinitis: Secondary | ICD-10-CM | POA: Diagnosis not present

## 2024-08-12 DIAGNOSIS — N3281 Overactive bladder: Secondary | ICD-10-CM | POA: Diagnosis not present

## 2024-08-12 DIAGNOSIS — J302 Other seasonal allergic rhinitis: Secondary | ICD-10-CM

## 2024-08-12 DIAGNOSIS — B4481 Allergic bronchopulmonary aspergillosis: Secondary | ICD-10-CM | POA: Diagnosis not present

## 2024-08-12 LAB — POCT INR: INR: 1.7 — AB (ref 2.0–3.0)

## 2024-08-12 MED ORDER — TROSPIUM CHLORIDE 20 MG PO TABS: 20.0000 mg | ORAL_TABLET | Freq: Two times a day (BID) | ORAL | 6 refills | Status: AC

## 2024-08-12 NOTE — Patient Instructions (Addendum)
 1. 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 - I am glad that the Breztri  is covered well and we will send in a refill for this.  - Daily controller medication(s): Breztri  two puffs twice daily + Nucala  monthly - Prior to physical activity: albuterol  2 puffs 10-15 minutes before physical activity. - Rescue medications: albuterol  4 puffs every 4-6 hours as needed - Asthma control goals:  * Full participation in all desired activities (may need albuterol  before activity) * Albuterol  use two time or less a week on average (not counting use with activity) * Cough interfering with sleep two time or less a month * Oral steroids no more than once a year * No hospitalizations  2. Chronic rhinitis - with nasal polyps - Previous testing showed: grasses, ragweed, and trees - Continue with: Zyrtec  (cetirizine ) 10mg  tablet once daily - Continue with: Atrovent  (ipratropium) one spray per nostril 3 times daily for one week or so - You can use an extra dose of the antihistamine, if needed, for breakthrough symptoms.  - Consider nasal saline rinses 1-2 times daily to remove allergens from the nasal cavities as well as help with mucous clearance (this is especially helpful to do before the nasal sprays are given)  3. Recurrent infections - We are going to hold off on further workup now.  - Previous workup was largely normal.   4. Return in about 6 months (around 02/09/2025). You can have the follow up appointment with Dr. Iva or a Nurse Practicioner (our Nurse Practitioners are excellent and always have Physician oversight!).    Please inform us  of any Emergency Department visits, hospitalizations, or changes in symptoms. Call us  before going to the ED for breathing or allergy  symptoms since we might be able to fit you in for a sick visit. Feel free to contact us  anytime with any questions, problems, or concerns.  It was a pleasure to see you again  today!  Websites that have reliable patient information: 1. American Academy of Asthma, Allergy , and Immunology: www.aaaai.org 2. Food Allergy  Research and Education (FARE): foodallergy.org 3. Mothers of Asthmatics: http://www.asthmacommunitynetwork.org 4. American College of Allergy , Asthma, and Immunology: www.acaai.org      "Like" us  on Facebook and Instagram for our latest updates!      A healthy democracy works best when [employer] participate! Make sure you are registered to vote! If you have moved or changed any of your contact information, you will need to get this updated before voting! Scan the QR codes below to learn more!

## 2024-08-12 NOTE — Progress Notes (Unsigned)
 FOLLOW UP  Date of Service/Encounter:  08/12/24   Assessment:   Mild persistent asthma, uncomplicated - doing very well on Nucala    Seasonal allergic rhinitis due to pollen (grasses, ragweed, and trees)   Bilateral nasal polyposis - doing well on Nucala    ABPA (allergic bronchopulmonary aspergillosis) - currently on prednisone  with Pulmonology, w slated to start posaconazole  at some pointas    Recurrent infections - received Pneumovax in June 2020 (with excellent response)   Fire  ant bites - getting blood work   Plan/Recommendations:   Patient Instructions  1. 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 - I am glad that the Breztri  is covered well and we will send in a refill for this.  - Daily controller medication(s): Breztri  two puffs twice daily + Nucala  monthly - Prior to physical activity: albuterol  2 puffs 10-15 minutes before physical activity. - Rescue medications: albuterol  4 puffs every 4-6 hours as needed - Asthma control goals:  * Full participation in all desired activities (may need albuterol  before activity) * Albuterol  use two time or less a week on average (not counting use with activity) * Cough interfering with sleep two time or less a month * Oral steroids no more than once a year * No hospitalizations  2. Chronic rhinitis - with nasal polyps - Previous testing showed: grasses, ragweed, and trees - Continue with: Zyrtec  (cetirizine ) 10mg  tablet once daily - Continue with: Atrovent  (ipratropium) one spray per nostril 3 times daily for one week or so - You can use an extra dose of the antihistamine, if needed, for breakthrough symptoms.  - Consider nasal saline rinses 1-2 times daily to remove allergens from the nasal cavities as well as help with mucous clearance (this is especially helpful to do before the nasal sprays are given)  3. Recurrent infections - We are going to hold off on further workup now.  -  Previous workup was largely normal.   4. Return in about 6 months (around 02/09/2025). You can have the follow up appointment with Dr. Iva or a Nurse Practicioner (our Nurse Practitioners are excellent and always have Physician oversight!).    Please inform us  of any Emergency Department visits, hospitalizations, or changes in symptoms. Call us  before going to the ED for breathing or allergy  symptoms since we might be able to fit you in for a sick visit. Feel free to contact us  anytime with any questions, problems, or concerns.  It was a pleasure to see you again today!  Websites that have reliable patient information: 1. American Academy of Asthma, Allergy , and Immunology: www.aaaai.org 2. Food Allergy  Research and Education (FARE): foodallergy.org 3. Mothers of Asthmatics: http://www.asthmacommunitynetwork.org 4. American College of Allergy , Asthma, and Immunology: www.acaai.org      "Like" us  on Facebook and Instagram for our latest updates!      A healthy democracy works best when [employer] participate! Make sure you are registered to vote! If you have moved or changed any of your contact information, you will need to get this updated before voting! Scan the QR codes below to learn more!             Subjective:   Andrea Mason is a 73 y.o. 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) 07/01/2024   Long term (current) use of anticoagulants 07/01/2024   Labium boil 12/31/2023   Upper abdominal pain 12/31/2023   Diarrhea 04/24/2023   Leg DVT (deep venous thromboembolism), acute, left (HCC) 09/02/2022   History of stroke 09/02/2022   Lesion of  right pinna 08/25/2022   Pain and swelling of left lower leg 08/23/2022   Ankle edema, bilateral 08/01/2022   Elevated serum creatinine 08/01/2022   Bilateral hip joint arthritis 07/16/2022   Right sided sciatica 07/14/2022   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   Recurrent infections 04/13/2022   Anemia 03/06/2022   Thrush 04/12/2021   Urinary frequency 09/22/2020   Wrist pain, acute, left 09/09/2020   CKD (chronic kidney disease) stage 3, GFR 30-59 ml/min (HCC) 12/31/2019   Painful legs and moving toes of right foot 12/16/2019   Skin nodule 11/16/2019   Nocturia 11/16/2019   Low back pain 11/16/2019   Acute recurrent pansinusitis 02/17/2019   Alopecia 01/31/2019   Upper airway cough syndrome 06/14/2018   Rash 05/30/2018   Vitamin D  deficiency 08/31/2017   Facial contusion, subsequent encounter 03/12/2017   Vertigo 03/12/2017   Right rotator cuff tear 07/27/2016   Right shoulder pain 07/11/2016   Overactive bladder 01/19/2016   Cough variant asthma 09/07/2015   Burn 09/06/2015   Vocal cord dysfunction 04/05/2015   Asthma 04/05/2015   Left knee pain 03/12/2015   Right knee pain 03/12/2015   Cough 12/08/2014   Skin lumps, generalized 09/08/2014   Toe pain, right 08/06/2014   Pre-ulcerative corn or callous 08/06/2014   Hammertoe 08/06/2014   Swelling of joint, ankle, right 08/06/2014   Pansinusitis 06/02/2014   Cerebral thrombosis with cerebral infarction (HCC) 05/26/2014   Weakness 05/25/2014   Right arm pain 05/25/2014   Numbness in right leg 05/25/2014   Numbness and tingling of right arm 05/25/2014   Embolic stroke (HCC) 03/31/2014   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   Alterations of sensations, late effect of cerebrovascular disease(438.6) 02/10/2014   Cerebral infarction (HCC) 11/25/2013   Hyperlipidemia 11/24/2013   Syncope 11/20/2013   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   Back pain 11/06/2013    Hypokalemia 05/06/2013   Family history of colon cancer 05/06/2013   Lipoma 09/03/2012   Hyperglycemia 08/15/2012   Constipation 01/02/2012   Encounter for well adult exam with abnormal findings 05/24/2011   Seasonal allergic rhinitis due to pollen 02/16/2011   Spondylosis 02/09/2011   Hypersomnia with sleep apnea 01/26/2011   UTI (urinary tract infection) 11/15/2010   NASAL POLYP 07/28/2010   FIBROIDS, UTERUS 07/27/2010   COMPUTERIZED TOMOGRAPHY, CHEST, ABNORMAL 09/16/2009   Seasonal and perennial allergic rhinitis 05/29/2009   PERIPHERAL EDEMA 05/28/2009   Mild persistent asthma, uncomplicated 11/23/2008   LEG PAIN, LEFT 06/30/2008   History of colonic polyps 06/30/2008   OBESITY 12/12/2007   Depression 12/12/2007   Essential hypertension 12/12/2007   RHINOSINUSITIS, CHRONIC 12/12/2007   GERD 12/12/2007   ARTHRITIS 12/12/2007    History obtained from: chart review and {Persons; PED relatives w/patient:19415::patient}.  Discussed the use of AI scribe software for clinical note transcription with the patient and/or guardian, who gave verbal consent to proceed.  Marlowe is a 73 y.o. female presenting for {Blank single:19197::a food challenge,a drug challenge,skin testing,a sick visit,an evaluation of ***,a follow up visit}.  She was last seen in March  2025.  At that time, lung testing looked phenomenal.  We gave her sample of Breztri  to use until her [place] was [place].  For her rhinitis, we continue with cetirizine  10 mg daily.  We started her on Atrovent  1 spray per nostril up to 3 times daily as well as nasal saline rinses.  Since the last visit, Asthma/Respiratory Symptom History: ***  Allergic Rhinitis Symptom History: ***  Food Allergy  Symptom History: ***  Skin Symptom History: ***  GERD Symptom History: ***  Infection Symptom History: ***  Otherwise, there have been no changes to her past medical history, surgical history, family history, or  social history.    Review of systems otherwise negative other than that mentioned in the HPI.    Objective:   Blood pressure 130/70, pulse 78, temperature 98.1 F (36.7 C), temperature source Temporal, resp. rate 18, height 5' 0.63 (1.54 m), weight 183 lb 14.4 oz (83.4 kg), SpO2 99%. Body mass index is 35.17 kg/m.    Physical Exam   Diagnostic studies:    Spirometry: results normal (FEV1: 1.45/86%, FVC: 2.03/94%, FEV1/FVC: 71%).    Spirometry consistent with normal pattern. {Blank single:19197::Albuterol /Atrovent  nebulizer,Xopenex /Atrovent  nebulizer,Albuterol  nebulizer,Albuterol  four puffs via MDI,Xopenex  four puffs via MDI} treatment given in clinic with {Blank single:19197::significant improvement in FEV1 per ATS criteria,significant improvement in FVC per ATS criteria,significant improvement in FEV1 and FVC per ATS criteria,improvement in FEV1, but not significant per ATS criteria,improvement in FVC, but not significant per ATS criteria,improvement in FEV1 and FVC, but not significant per ATS criteria,no improvement}.  Allergy  Studies: {Blank single:19197::none,deferred due to recent antihistamine use,deferred due to insurance stipulations that require a separate visit for testing,labs sent instead, }    {Blank single:19197::Allergy  testing results were read and interpreted by myself, documented by clinical staff., }      Marty Shaggy, MD  [facility] 

## 2024-08-12 NOTE — Progress Notes (Signed)
 [facility] Return Visit  SUBJECTIVE  History of Present Illness: Andrea Mason is a 73 y.o. female seen in follow-up for OAB. Plan at last visit was lifestyle changes and start Trospium  20mg  x2 daily.  Patient reports her main side effect has been dry mouth, but otherwise she feels >75% improved in her urgency and frequency. She said she has really decreased her coffee consumption which has also helped significantly.      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   Past Surgical History: She  has a past surgical history that includes Nasal sinus surgery (07/2006); Nasal polyp surgery (07/2006); Foot surgery (1998); Tubal ligation; Implantable loop recorder placement (09/16/14); LOOP RECORDER REMOVAL (N/A, 10/02/2017); and Colonoscopy.   Medications: She has a current medication list which includes the following prescription(s): albuterol , alendronate , AMBULATORY NON FORMULARY MEDICATION, aspirin  ec, biotin, breztri  aerosphere, cetirizine , vitamin d3, clotrimazole , cyanocobalamin , famotidine , gabapentin , ipratropium, nucala , montelukast , OVER THE COUNTER MEDICATION, pantoprazole , flutter, sodium chloride , spacer/aero-holding chambers, spironolactone , warfarin, and trospium , and the following Facility-Administered Medications: mepolizumab .   Allergies: Patient is allergic to iodine, ivp dye [iodinated contrast media], promethazine -codeine , and tramadol .   Social History: Patient  reports that she has never smoked. She has been exposed to tobacco smoke. She has never used smokeless tobacco. She reports current alcohol use. She reports that she does not use drugs.     OBJECTIVE      Physical Exam: Vitals:   08/12/24 1655  BP: 108/71  Pulse: 74   Gen: No apparent distress, A&O x 3.  Detailed Urogynecologic Evaluation:  Deferred. Prior exam showed:     ASSESSMENT AND PLAN    Andrea Mason is a 73 y.o. with:  1. Overactive bladder    Patient to continue on her Trospium . She has not yet done physical therapy and is unsure if she will have time to with her current life commitments and her need to see coumadin  clinic every Tuesday at this point.   Patient to follow up in 6 months or sooner if needed.   Elliannah Wayment G Neema Barreira, NP

## 2024-08-12 NOTE — Telephone Encounter (Signed)
 Received fax fro [place] - DOB/DPR verified - advising new prescription needed for Breztri .  Patient has appointment today - forwarding message to provider.

## 2024-08-12 NOTE — Progress Notes (Signed)
 Indication is embolic stroke. Pt was taking Eliquis  but it became cost prohibitive. Pt stopped taking it a couple of months ago. Pt was started on warfarin on 8/1. Pt has been on the low side end of her INR range the last two results, 2.0 and 2.1. Due to this a small change in weekly dosing will be made. Increase dose today to take 1 tablet and then change weekly dose to take 1/2 tablet daily except take 1 tablet on Tuesday. Recheck in 2 week.

## 2024-08-12 NOTE — Patient Instructions (Signed)
 Continue your trospium  and your lifestyle changes

## 2024-08-12 NOTE — Patient Instructions (Addendum)
 Pre visit review using our clinic review tool, if applicable. No additional management support is needed unless otherwise documented below in the visit note.  Increase dose today to take 1 tablet and then change weekly dose to take 1/2 tablet daily except take 1 tablet on Tuesday. Recheck in 2 week.

## 2024-08-13 ENCOUNTER — Encounter: Payer: Self-pay | Admitting: Allergy & Immunology

## 2024-08-13 MED ORDER — BREZTRI AEROSPHERE 160-9-4.8 MCG/ACT IN AERO: 2.0000 | INHALATION_SPRAY | Freq: Two times a day (BID) | RESPIRATORY_TRACT | 4 refills | Status: AC

## 2024-08-13 NOTE — Telephone Encounter (Signed)
 I sent a prescription in yesterday to the [facility].   Marty Shaggy, MD [facility] 

## 2024-08-21 ENCOUNTER — Telehealth: Payer: Self-pay | Admitting: Internal Medicine

## 2024-08-21 ENCOUNTER — Other Ambulatory Visit: Payer: Self-pay | Admitting: Gastroenterology

## 2024-08-21 ENCOUNTER — Ambulatory Visit: Payer: Self-pay | Admitting: Internal Medicine

## 2024-08-21 NOTE — Telephone Encounter (Unsigned)
 Copied from CRM [id number]. Topic: Clinical - Medication Refill >> Aug 21, 2024 11:22 AM Russell PARAS wrote: Medication: amoxicillin -clavulanate (AUGMENTIN ) 875-125 MG tablet   Pt typically prescribed this med when experiencing symptoms and is out of refills. Has been prescribed by [facility] in the past  Has the patient contacted their pharmacy? Yes, was advised refill request was sent to clinic, out of refills (Agent: If no, request that the patient contact the pharmacy for the refill. If patient does not wish to contact the pharmacy document the reason why and proceed with request.) (Agent: If yes, when and what did the pharmacy advise?)  This is the patient's preferred pharmacy:  [id number] - [place], [place] - [address] AT [facility] OF [address] [place]  [address] Phone: [phone] Fax: [phone]  Is this the correct pharmacy for this prescription? Yes If no, delete pharmacy and type the correct one.   Has the prescription been filled recently? No  Is the patient out of the medication? Yes  Has the patient been seen for an appointment in the last year OR does the patient have an upcoming appointment? Yes, 04/14/2024  Can we respond through MyChart? Yes  Agent: Please be advised that Rx refills may take up to 3 business days. We ask that you follow-up with your pharmacy.

## 2024-08-21 NOTE — Telephone Encounter (Signed)
 FYI Only or Action Required?: Action required by provider: requesting prescription for Augmentin  due symptoms.  Patient is followed in Pulmonology for asthma, last seen on 04/14/2024 by Darlean Ozell NOVAK, MD.  Called Nurse Triage reporting Cough.  Symptoms began several days ago.  Interventions attempted: Rescue inhaler, Maintenance inhaler, and Increased fluids/rest.  Symptoms are: unchanged.  Triage Disposition: See HCP Within 4 Hours (Or PCP Triage)  Patient/caregiver understands and will follow disposition?: No, wishes to speak with PCP  Reason for Disposition  [1] MILD difficulty breathing (e.g., minimal/no SOB at rest, SOB with walking, pulse < 100) AND [2] still present when not coughing  Answer Assessment - Initial Assessment Questions 1. ONSET: When did the cough begin?      Started over the weekend 2. SEVERITY: How bad is the cough today?      Mild cough 3. SPUTUM: Describe the color of your sputum (e.g., none, dry cough; clear, white, yellow, green)     Green sputum 4. HEMOPTYSIS: Are you coughing up any blood? If Yes, ask: How much? (e.g., flecks, streaks, tablespoons, etc.)     no 5. DIFFICULTY BREATHING: Are you having difficulty breathing? If Yes, ask: How bad is it? (e.g., mild, moderate, severe)      Mild  6. FEVER: Do you have a fever? If Yes, ask: What is your temperature, how was it measured, and when did it start?     Not currently 7. CARDIAC HISTORY: Do you have any history of heart disease? (e.g., heart attack, congestive heart failure)      no 8. LUNG HISTORY: Do you have any history of lung disease?  (e.g., pulmonary embolus, asthma, emphysema)     asthma 9. PE RISK FACTORS: Do you have a history of blood clots? (or: recent major surgery, recent prolonged travel, bedridden)     no 10. OTHER SYMPTOMS: Do you have any other symptoms? (e.g., runny nose, wheezing, chest pain)       Nasal congestion, head congestion 12. TRAVEL: Have  you traveled out of the country in the last month? (e.g., travel history, exposures)       No  Patient reports nasal and head congestion with green sputum. Patient does report slight cough. Patient is asking for Augmentin  for her.  Protocols used: Cough - Acute Productive-A-AH

## 2024-08-22 ENCOUNTER — Telehealth: Payer: Self-pay

## 2024-08-22 MED ORDER — AMOXICILLIN-POT CLAVULANATE 875-125 MG PO TABS: 1.0000 | ORAL_TABLET | Freq: Two times a day (BID) | ORAL | 0 refills | Status: DC

## 2024-08-22 NOTE — Telephone Encounter (Signed)
 Called patient and sent in Augmentin , reviewed Dr. Chari last note in May whom recommended 10 day course of Augmentin  for mucopurulent bronchitis

## 2024-08-22 NOTE — Telephone Encounter (Signed)
 Copied from CRM [id number]. Topic: Clinical - Medication Question >> Aug 22, 2024 11:59 AM Devaughn RAMAN wrote: Reason for CRM: Patient called in regarding medication, patient stated she is very sick and awaiting her medication refill of  amoxicillin -clavulanate (AUGMENTIN ) 875-125 MG tablet . Contacted [facility] spoke with Almeda stated the medication has been put in and can take up to 48hrs to refill. Advised patient of this information.   Spoke w/ PT I let her know that I had sent the request to our Provider on call and waiting to hear something back   VBU

## 2024-08-22 NOTE — Telephone Encounter (Signed)
 Copied from CRM [id number]. Topic: Clinical - Medication Question >> Aug 22, 2024 11:59 AM Devaughn RAMAN wrote: Reason for CRM: Patient called in regarding medication, patient stated she is very sick and awaiting her medication refill of  amoxicillin -clavulanate (AUGMENTIN ) 875-125 MG tablet . Contacted [facility] [place] spoke with Almeda stated the medication has been put in and can take up to 48hrs to refill. Advised patient of this information.

## 2024-08-25 NOTE — Telephone Encounter (Signed)
 Andrea Ferrari, NP spoke with the pt 9/19 and called in course of Augmentin .  Nfn

## 2024-08-25 NOTE — Telephone Encounter (Signed)
 Patient has picked up Rx.   - NFN

## 2024-08-26 ENCOUNTER — Ambulatory Visit

## 2024-08-26 ENCOUNTER — Ambulatory Visit (INDEPENDENT_AMBULATORY_CARE_PROVIDER_SITE_OTHER)

## 2024-08-26 DIAGNOSIS — Z7901 Long term (current) use of anticoagulants: Secondary | ICD-10-CM

## 2024-08-26 DIAGNOSIS — J4550 Severe persistent asthma, uncomplicated: Secondary | ICD-10-CM

## 2024-08-26 LAB — POCT INR: INR: 2.4 (ref 2.0–3.0)

## 2024-08-26 NOTE — Progress Notes (Signed)
 Indication is embolic stroke. Pt was taking Eliquis  but it became cost prohibitive. Pt stopped taking it a couple of months ago. Pt was started on warfarin on 8/1. Pt was prescribed Augmentin , x 10 days, by pulmonologist on 9/19 for head congestion and cough. Will finish abx on 9/29. No interaction with warfarin. Continue 1/2 tablet daily except take 1 tablet on Tuesday. Recheck in 3 week.

## 2024-08-26 NOTE — Patient Instructions (Addendum)
 Pre visit review using our clinic review tool, if applicable. No additional management support is needed unless otherwise documented below in the visit note.  Continue 1/2 tablet daily except take 1 tablet on Tuesday. Recheck in 3 week.

## 2024-09-03 ENCOUNTER — Ambulatory Visit: Admitting: Internal Medicine

## 2024-09-03 ENCOUNTER — Encounter: Payer: Self-pay | Admitting: Gastroenterology

## 2024-09-11 ENCOUNTER — Other Ambulatory Visit: Payer: Self-pay | Admitting: Internal Medicine

## 2024-09-11 DIAGNOSIS — Z7901 Long term (current) use of anticoagulants: Secondary | ICD-10-CM

## 2024-09-11 NOTE — Telephone Encounter (Signed)
 Pt is compliant with warfarin management and PCP apts.  Sent in refill of warfarin to requested pharmacy.

## 2024-09-15 ENCOUNTER — Telehealth: Payer: Self-pay

## 2024-09-15 NOTE — Telephone Encounter (Signed)
 Pt returned call and reported she will be starting a job and is not sure which day she can come for coumadin  clinic. She will f/u with coumadin  clinic tomorrow after discussing work hours with Production designer, theatre/television/film.

## 2024-09-15 NOTE — Telephone Encounter (Signed)
 Pt LVM that she needs to change her coumadin  clinic apt for tomorrow to another day.  Tried to contact pt to RS but had to LVM.

## 2024-09-16 ENCOUNTER — Ambulatory Visit

## 2024-09-18 NOTE — Telephone Encounter (Signed)
 Pt called and RS coumadin  clinic apt for tomorrow.

## 2024-09-19 ENCOUNTER — Ambulatory Visit

## 2024-09-19 DIAGNOSIS — Z7901 Long term (current) use of anticoagulants: Secondary | ICD-10-CM

## 2024-09-19 LAB — POCT INR: INR: 2.8 (ref 2.0–3.0)

## 2024-09-19 NOTE — Progress Notes (Signed)
 Indication: embolic stroke. Continue 1/2 tablet daily except take 1 tablet on Tuesday. Recheck in 4 week.

## 2024-09-19 NOTE — Patient Instructions (Addendum)
 Pre visit review using our clinic review tool, if applicable. No additional management support is needed unless otherwise documented below in the visit note.  Continue 1/2 tablet daily except take 1 tablet on Tuesday. Recheck in 4 week.

## 2024-09-23 ENCOUNTER — Ambulatory Visit (INDEPENDENT_AMBULATORY_CARE_PROVIDER_SITE_OTHER)

## 2024-09-23 DIAGNOSIS — J4550 Severe persistent asthma, uncomplicated: Secondary | ICD-10-CM | POA: Diagnosis not present

## 2024-10-05 ENCOUNTER — Other Ambulatory Visit: Payer: Self-pay

## 2024-10-05 ENCOUNTER — Emergency Department (HOSPITAL_COMMUNITY)

## 2024-10-05 ENCOUNTER — Emergency Department (HOSPITAL_COMMUNITY)
Admission: EM | Admit: 2024-10-05 | Discharge: 2024-10-05 | Disposition: A | Discharge status: Home / Self Care | Attending: Emergency Medicine | Admitting: Emergency Medicine

## 2024-10-05 DIAGNOSIS — I10 Essential (primary) hypertension: Secondary | ICD-10-CM | POA: Insufficient documentation

## 2024-10-05 DIAGNOSIS — R110 Nausea: Secondary | ICD-10-CM | POA: Diagnosis not present

## 2024-10-05 DIAGNOSIS — R55 Syncope and collapse: Secondary | ICD-10-CM | POA: Diagnosis not present

## 2024-10-05 DIAGNOSIS — Z8673 Personal history of transient ischemic attack (TIA), and cerebral infarction without residual deficits: Secondary | ICD-10-CM | POA: Diagnosis not present

## 2024-10-05 LAB — CBC WITH DIFFERENTIAL/PLATELET
Abs Immature Granulocytes: 0.03 K/uL (ref 0.00–0.07)
Basophils Absolute: 0 K/uL (ref 0.0–0.1)
Basophils Relative: 1 %
Eosinophils Absolute: 0.1 K/uL (ref 0.0–0.5)
Eosinophils Relative: 1 %
HCT: 34.6 % — ABNORMAL LOW (ref 36.0–46.0)
Hemoglobin: 10.8 g/dL — ABNORMAL LOW (ref 12.0–15.0)
Immature Granulocytes: 0 %
Lymphocytes Relative: 16 %
Lymphs Abs: 1.3 K/uL (ref 0.7–4.0)
MCH: 28.9 pg (ref 26.0–34.0)
MCHC: 31.2 g/dL (ref 30.0–36.0)
MCV: 92.5 fL (ref 80.0–100.0)
Monocytes Absolute: 0.6 K/uL (ref 0.1–1.0)
Monocytes Relative: 7 %
Neutro Abs: 6.1 K/uL (ref 1.7–7.7)
Neutrophils Relative %: 75 %
Platelets: 239 K/uL (ref 150–400)
RBC: 3.74 MIL/uL — ABNORMAL LOW (ref 3.87–5.11)
RDW: 13.5 % (ref 11.5–15.5)
WBC: 8.1 K/uL (ref 4.0–10.5)
nRBC: 0 % (ref 0.0–0.2)

## 2024-10-05 LAB — LIPASE, BLOOD: Lipase: 81 U/L — ABNORMAL HIGH (ref 11–51)

## 2024-10-05 LAB — RESP PANEL BY RT-PCR (RSV, FLU A&B, COVID)  RVPGX2
Influenza A by PCR: NEGATIVE
Influenza B by PCR: NEGATIVE
Resp Syncytial Virus by PCR: NEGATIVE
SARS Coronavirus 2 by RT PCR: NEGATIVE

## 2024-10-05 LAB — COMPREHENSIVE METABOLIC PANEL WITH GFR
ALT: 9 U/L (ref 0–44)
AST: 24 U/L (ref 15–41)
Albumin: 4 g/dL (ref 3.5–5.0)
Alkaline Phosphatase: 81 U/L (ref 38–126)
Anion gap: 11 (ref 5–15)
BUN: 21 mg/dL (ref 8–23)
CO2: 22 mmol/L (ref 22–32)
Calcium: 9.2 mg/dL (ref 8.9–10.3)
Chloride: 104 mmol/L (ref 98–111)
Creatinine, Ser: 1.33 mg/dL — ABNORMAL HIGH (ref 0.44–1.00)
GFR, Estimated: 43 mL/min — ABNORMAL LOW (ref >60)
Glucose, Bld: 115 mg/dL — ABNORMAL HIGH (ref 70–99)
Potassium: 3.8 mmol/L (ref 3.5–5.1)
Sodium: 137 mmol/L (ref 135–145)
Total Bilirubin: 0.8 mg/dL (ref 0.0–1.2)
Total Protein: 7.4 g/dL (ref 6.5–8.1)

## 2024-10-05 LAB — TROPONIN T, HIGH SENSITIVITY
Troponin T High Sensitivity: <15 ng/L (ref 0–19)
Troponin T High Sensitivity: <15 ng/L (ref 0–19)

## 2024-10-05 LAB — CBG MONITORING, ED: Glucose-Capillary: 93 mg/dL (ref 70–99)

## 2024-10-05 MED ORDER — ACETAMINOPHEN 500 MG PO TABS
1000.0000 mg | ORAL_TABLET | Freq: Once | ORAL | Status: DC
  Filled 2024-10-05: qty 2

## 2024-10-05 MED ORDER — OXYCODONE HCL 5 MG PO TABS
5.0000 mg | ORAL_TABLET | Freq: Once | ORAL | Status: DC
  Filled 2024-10-05: qty 1

## 2024-10-05 NOTE — Discharge Instructions (Signed)
 You were seen today for lightheadedness.  Your lab work and x-ray are grossly reassuring.  However your blood pressure was very high on arrival.  Like we discussed I recommend you discontinue any further use of Sudafed or Afrin or other phenylephrine products as he seems to be very sensitive and they caused acute elevations in your blood pressure. Please follow-up your primary doctor for recheck within 48 hours or return with any changes.

## 2024-10-05 NOTE — ED Provider Notes (Signed)
 [place] EMERGENCY DEPARTMENT AT [facility] Provider Note   CSN: [id number] Arrival date & time: 10/05/24  0119     History Chief Complaint  Patient presents with   Near Syncope    HPI Andrea Mason is a 73 y.o. female presenting for a near syncopal event.  She is a 73 year old female, history of hypertension, strokes, aphasia, chronic gait instability.  She states that she was using Afrin for rhinitis last night while trying to cook and she felt lightheaded about 10 minutes after this.  She laid down on the kitchen floor and EMS was called.  Patient with persistent lightheadedness on their arrival, grossly hypertensive.  By time of arrival here all symptoms have completely resolved.  She denies fevers chills nausea vomiting syncope shortness of breath.   Patient's recorded medical, surgical, social, medication list and allergies were reviewed in the Snapshot window as part of the initial history.   Review of Systems   Review of Systems  Constitutional:  Negative for chills and fever.  HENT:  Positive for congestion. Negative for ear pain and sore throat.   Eyes:  Negative for pain and visual disturbance.  Respiratory:  Negative for cough and shortness of breath.   Cardiovascular:  Negative for chest pain and palpitations.  Gastrointestinal:  Negative for abdominal pain and vomiting.  Genitourinary:  Negative for dysuria and hematuria.  Musculoskeletal:  Negative for arthralgias and back pain.  Skin:  Negative for color change and rash.  Neurological:  Negative for seizures and syncope.  All other systems reviewed and are negative.   Physical Exam Updated Vital Signs BP 108/66   Pulse 82   Temp 99.3 F (37.4 C)   Resp 18   LMP  (LMP Unknown)   SpO2 96%  Physical Exam Vitals and nursing note reviewed.  Constitutional:      General: She is not in acute distress.    Appearance: She is well-developed.  HENT:     Head: Normocephalic and atraumatic.  Eyes:      Conjunctiva/sclera: Conjunctivae normal.  Cardiovascular:     Rate and Rhythm: Normal rate and regular rhythm.     Heart sounds: No murmur heard. Pulmonary:     Effort: Pulmonary effort is normal. No respiratory distress.     Breath sounds: Normal breath sounds.  Abdominal:     General: There is no distension.     Palpations: Abdomen is soft.     Tenderness: There is no abdominal tenderness. There is no right CVA tenderness or left CVA tenderness.  Musculoskeletal:        General: No swelling or tenderness. Normal range of motion.     Cervical back: Neck supple.  Skin:    General: Skin is warm and dry.  Neurological:     General: No focal deficit present.     Mental Status: She is alert and oriented to person, place, and time. Mental status is at baseline.     Cranial Nerves: No cranial nerve deficit.      ED Course/ Medical Decision Making/ A&P    Procedures Procedures   Medications Ordered in ED Medications  oxyCODONE (Oxy IR/ROXICODONE) immediate release tablet 5 mg (5 mg Oral Not Given 10/05/24 0253)  acetaminophen  (TYLENOL ) tablet 1,000 mg (1,000 mg Oral Not Given 10/05/24 0253)   Medical Decision Making:   Andrea Mason is a 73 y.o. female who presented to the ED today with a near-syncopal episode detailed above.    Patient placed  on continuous vitals and telemetry monitoring while in ED which was reviewed periodically.  Complete initial physical exam performed, notably the patient  was hemodynamically stable no acute distress.    Reviewed and confirmed nursing documentation for past medical history, family history, social history.    Initial Assessment:   With the patient's presentation of near syncope, most likely diagnosis is orthostatic hypotension vs vasovagal episode. Other diagnoses were considered including (but not limited to) arrythmogenic syncope, valvular abnormality, PE, aortic dissection. These are considered less likely due to history of present  illness and physical exam findings.   This is most consistent with an acute life/limb threatening illness complicated by underlying chronic conditions.  Initial Plan:  Patient completely resolved at this time.  Will observe on telemetry for cardiac etiology of her near syncope Screening labs including CBC and Metabolic panel to evaluate for infectious or metabolic etiology of disease.  Urinalysis with reflex culture ordered to evaluate for UTI or relevant urologic/nephrologic pathology.  CXR to evaluate for structural/infectious intrathoracic pathology.  EKG and serial troponin to evaluate for cardiac pathology. Objective evaluation as below reviewed after administration of IVF/Telemetry monitoring  Initial Study Results:   Laboratory  All laboratory results reviewed without evidence of clinically relevant pathology.    EKG was reviewed independently. Rate, rhythm, axis, intervals all examined and without medically relevant abnormality. ST segments without concerns for elevations.    Radiology:  All images reviewed independently. Agree with radiology report at this time.   DG Chest Portable 1 View Result Date: 10/05/2024 CLINICAL DATA:  Dizzy and nauseous. EXAM: PORTABLE CHEST 1 VIEW COMPARISON:  July 16, 2023 FINDINGS: The heart size and mediastinal contours are within normal limits. Mild linear scarring and/or atelectasis is seen within the medial aspect of the left lung base. No acute infiltrate, pleural effusion or pneumothorax is identified. Multilevel degenerative changes seen throughout the thoracic spine. IMPRESSION: No active disease. Electronically Signed   By: Suzen Dials M.D.   On: 10/05/2024 02:43      Final Assessment and Plan:   Patient observed for 5 hours in the emergency room.  No further near syncopal events.  She feels completely resolved.  Blood pressure returned to normal spontaneously.  There was likely some component of acute hypertensive event from  overutilization of adrenergic medications yesterday.  We discussed that she should discontinue this medication given her risk and robust response with initial hypertension to the 200s.  She expressed understanding that she should discontinue this medication and stated she would follow-up with her primary care doctor in the outpatient setting.  Completely resolved at this time and patient feels comfortable with discharge.  Clinical Impression:  1. Near syncope      Discharge   Final Clinical Impression(s) / ED Diagnoses Final diagnoses:  Near syncope    Rx / DC Orders ED Discharge Orders     None         Jerral Meth, MD 10/05/24 [phone]

## 2024-10-05 NOTE — ED Triage Notes (Signed)
 Pt bib [facility] from home, pt reports she started feeling dizzy and nauseous while cooking. Pt states she did not pass out,she just sat down and called 911. Denies dizziness at this time.    CBG 171

## 2024-10-07 ENCOUNTER — Ambulatory Visit: Admitting: Dermatology

## 2024-10-07 DIAGNOSIS — L649 Androgenic alopecia, unspecified: Secondary | ICD-10-CM | POA: Diagnosis not present

## 2024-10-07 DIAGNOSIS — L658 Other specified nonscarring hair loss: Secondary | ICD-10-CM | POA: Diagnosis not present

## 2024-10-07 DIAGNOSIS — Z7189 Other specified counseling: Secondary | ICD-10-CM

## 2024-10-07 MED ORDER — SAFETY SEAL MISCELLANEOUS MISC: 1.0000 | Freq: Every morning | 3 refills | Status: AC

## 2024-10-07 NOTE — Progress Notes (Signed)
   New Patient Visit   Subjective  Andrea Mason is a 73 y.o. female who presents for the following: Hair loss  Patient (and/or pt guardian) consented to the use of AI-assisted tools for note generation.   The following portions of the chart were reviewed this encounter and updated as appropriate: medications, allergies, medical history  Review of Systems:  No other skin or systemic complaints except as noted in HPI or Assessment and Plan.  Objective  Well appearing patient in no apparent distress; mood and affect are within normal limits.  A focused examination was performed of the following areas: scalp  Relevant exam findings are noted in the Assessment and Plan.    Assessment & Plan    Traction alopecia and androgenetic alopecia Chronic hair thinning over the past year, exacerbated by tight hairstyles leading to traction alopecia. Additionally, androgenetic alopecia is present, characterized by thinning at the temples and top of the scalp, likely related to menopausal changes. Some Hair follicles may be scarred, limiting full regrowth.   - Prescribed compounded topical solution containing clobetasol  0.5%, minoxidil 8%, and finasteride 0.05% to be applied in the morning to prevent facial hair growth. - Advised against tight hairstyles to prevent further traction alopecia. - Recommended collagen supplement 'vital proteins' powder, one scoop daily in coffee or hot chocolate. - Scheduled follow-up in six months to assess hair regrowth.   Long term medication management.  Patient is using long term (months to years) prescription medication  to control their dermatologic condition.  These medications require periodic monitoring to evaluate for efficacy and side effects and may require periodic laboratory monitoring.        Return in about 6 months (around 04/06/2025).  I, Gordan Beams, CMA, am acting as scribe for [employer], DO.   Documentation: I have reviewed the  above documentation for accuracy and completeness, and I agree with the above.  Delon Lenis, DO

## 2024-10-07 NOTE — Patient Instructions (Addendum)
 VISIT SUMMARY:  During your visit, we discussed your concerns about hair thinning and alopecia, which have been progressively worsening over the past year. You mentioned that tight hairstyles have contributed to your hair loss, particularly at the temples and the top of your head.  YOUR PLAN:  -TRACTION ALOPECIA AND ANDROGENETIC ALOPECIA:  Traction alopecia is hair loss caused by tight hairstyles pulling on the hair, while androgenetic alopecia is a common form of hair thinning related to hormonal changes, often seen during menopause.   To address these issues, you have been prescribed a compounded topical solution containing clobetasol  0.5%, minoxidil 8%, and finasteride 0.05% to be applied in the morning. This solution aims to reduce inflammation, promote hair growth, and prevent further hair loss.   Additionally, you should avoid tight hairstyles to prevent further traction alopecia. A collagen supplement, 'vital proteins' powder, is recommended to support hair health; take one scoop daily in coffee or hot chocolate.  INSTRUCTIONS:  Please follow up in six months to assess your hair regrowth and the effectiveness of the treatment plan.      Important Information  Due to recent changes in healthcare laws, you may see results of your pathology and/or laboratory studies on MyChart before the doctors have had a chance to review them. We understand that in some cases there may be results that are confusing or concerning to you. Please understand that not all results are received at the same time and often the doctors may need to interpret multiple results in order to provide you with the best plan of care or course of treatment. Therefore, we ask that you please give us  2 business days to thoroughly review all your results before contacting the office for clarification. Should we see a critical lab result, you will be contacted sooner.   If You Need Anything After Your Visit  If you have  any questions or concerns for your doctor, please call our main line at [phone] If no one answers, please leave a voicemail as directed and we will return your call as soon as possible. Messages left after 4 pm will be answered the following business day.   You may also send us  a message via MyChart. We typically respond to MyChart messages within 1-2 business days.  For prescription refills, please ask your pharmacy to contact our office. Our fax number is [phone].  If you have an urgent issue when the clinic is closed that cannot wait until the next business day, you can page your doctor at the number below.    Please note that while we do our best to be available for urgent issues outside of office hours, we are not available 24/7.   If you have an urgent issue and are unable to reach us , you may choose to seek medical care at your doctor's office, retail clinic, urgent care center, or emergency room.  If you have a medical emergency, please immediately call 911 or go to the emergency department. In the event of inclement weather, please call our main line at [phone] for an update on the status of any delays or closures.  Dermatology Medication Tips: Please keep the boxes that topical medications come in in order to help keep track of the instructions about where and how to use these. Pharmacies typically print the medication instructions only on the boxes and not directly on the medication tubes.   If your medication is too expensive, please contact our office at [phone] or send us  a  message through MyChart.   We are unable to tell what your co-pay for medications will be in advance as this is different depending on your insurance coverage. However, we may be able to find a substitute medication at lower cost or fill out paperwork to get insurance to cover a needed medication.   If a prior authorization is required to get your medication covered by your insurance  company, please allow us  1-2 business days to complete this process.  Drug prices often vary depending on where the prescription is filled and some pharmacies may offer cheaper prices.  The website www.goodrx.com contains coupons for medications through different pharmacies. The prices here do not account for what the cost may be with help from insurance (it may be cheaper with your insurance), but the website can give you the price if you did not use any insurance.  - You can print the associated coupon and take it with your prescription to the pharmacy.  - You may also stop by our office during regular business hours and pick up a GoodRx coupon card.  - If you need your prescription sent electronically to a different pharmacy, notify our office through [facility] or by phone at [phone]

## 2024-10-08 ENCOUNTER — Telehealth: Payer: Self-pay

## 2024-10-08 NOTE — Transitions of Care (Post Inpatient/ED Visit) (Signed)
   10/08/2024  Name: Andrea Mason MRN: [id number] DOB: 11-12-1951  Today's TOC FU Call Status: Today's TOC FU Call Status:: Successful TOC FU Call Completed TOC FU Call Complete Date: 10/08/24 Patient's Name and Date of Birth confirmed.  Transition Care Management Follow-up Telephone Call Date of Discharge: 10/05/24 Discharge Facility: [facility]) Type of Discharge: Emergency Department Reason for ED Visit: Other: How have you been since you were released from the hospital?: Same Any questions or concerns?: No  Items Reviewed: Did you receive and understand the discharge instructions provided?: Yes Medications obtained,verified, and reconciled?: Yes (Medications Reviewed) Any new allergies since your discharge?: No Dietary orders reviewed?: No Do you have support at home?: Yes  Medications Reviewed Today: Medications Reviewed Today   Medications were not reviewed in this encounter     Home Care and Equipment/Supplies: Were Home Health Services Ordered?: NA Any new equipment or medical supplies ordered?: NA  Functional Questionnaire: Do you need assistance with bathing/showering or dressing?: No Do you need assistance with meal preparation?: No Do you need assistance with eating?: No Do you have difficulty maintaining continence: No Do you need assistance with getting out of bed/getting out of a chair/moving?: No Do you have difficulty managing or taking your medications?: No  Follow up appointments reviewed: PCP Follow-up appointment confirmed?: Yes Date of PCP follow-up appointment?: 10/09/24 Specialist Hospital Follow-up appointment confirmed?: NA Do you need transportation to your follow-up appointment?: No Do you understand care options if your condition(s) worsen?: Yes-patient verbalized understanding    SIGNATURE Ronnald Palms

## 2024-10-09 ENCOUNTER — Ambulatory Visit: Payer: Self-pay | Admitting: Internal Medicine

## 2024-10-09 ENCOUNTER — Ambulatory Visit: Admitting: Internal Medicine

## 2024-10-09 ENCOUNTER — Encounter: Payer: Self-pay | Admitting: Internal Medicine

## 2024-10-09 VITALS — BP 126/80 | HR 76 | Temp 97.6°F | Ht 60.63 in | Wt 185.0 lb

## 2024-10-09 DIAGNOSIS — N179 Acute kidney failure, unspecified: Secondary | ICD-10-CM | POA: Diagnosis not present

## 2024-10-09 DIAGNOSIS — R058 Other specified cough: Secondary | ICD-10-CM

## 2024-10-09 DIAGNOSIS — M5441 Lumbago with sciatica, right side: Secondary | ICD-10-CM

## 2024-10-09 DIAGNOSIS — I10 Essential (primary) hypertension: Secondary | ICD-10-CM

## 2024-10-09 DIAGNOSIS — E559 Vitamin D deficiency, unspecified: Secondary | ICD-10-CM

## 2024-10-09 LAB — BASIC METABOLIC PANEL WITH GFR
BUN: 12 mg/dL (ref 6–23)
CO2: 29 meq/L (ref 19–32)
Calcium: 9 mg/dL (ref 8.4–10.5)
Chloride: 101 meq/L (ref 96–112)
Creatinine, Ser: 0.96 mg/dL (ref 0.40–1.20)
GFR: 59.42 mL/min — ABNORMAL LOW (ref >60.00)
Glucose, Bld: 72 mg/dL (ref 70–99)
Potassium: 4.2 meq/L (ref 3.5–5.1)
Sodium: 136 meq/L (ref 135–145)

## 2024-10-09 MED ORDER — GABAPENTIN 100 MG PO CAPS: 100.0000 mg | ORAL_CAPSULE | Freq: Three times a day (TID) | ORAL | 3 refills | Status: DC | PRN

## 2024-10-09 MED ORDER — AZITHROMYCIN 250 MG PO TABS: ORAL_TABLET | ORAL | 1 refills | Status: AC

## 2024-10-09 NOTE — Assessment & Plan Note (Addendum)
 Lab Results  Component Value Date   CREATININE 1.33 (H) 10/05/2024   Very mild increased overall, cont to avoid nephrotoxins, cont oral fluids and recheck BMP;

## 2024-10-09 NOTE — Progress Notes (Signed)
 Patient ID: Andrea Mason, female   DOB: [birth year], 73 y.o.   MRN: [id number]        Chief Complaint: follow up prod cough, low back pain right sciatica, recent near syncope and mild AKI, htn       HPI:  Andrea Mason is a 73 y.o. female Here with acute onset mild to mod 2-3 days ST, HA, general weakness and malaise, with prod cough greenish sputum, but Pt denies chest pain, increased sob or doe, wheezing, orthopnea, PND, increased LE swelling, palpitations, dizziness or syncope.  Was just seen in ED oct 2 with near syncope and very mild increased Cr over baseline, GFR 43,  CXR, troponin, cbc stable.  Now cough more productive and she feels more ill and weak and reduced po intake.    Pt denies polydipsia, polyuria, or new focal neuro s/s.    Pt denies fever, night sweats, loss of appetite, or other constitutional symptoms  Also, right LBP and sciatica have returned with her trying to assist with a friend with moving.  Asks for restart gabapentin .        Wt Readings from Last 3 Encounters:  10/09/24 185 lb (83.9 kg)  08/12/24 183 lb 14.4 oz (83.4 kg)  07/16/24 183 lb 14.4 oz (83.4 kg)   BP Readings from Last 3 Encounters:  10/09/24 126/80  10/05/24 114/78  08/12/24 130/70         Past Medical History:  Diagnosis Date   Allergic rhinitis    Allergy     Asthma    DJD (degenerative joint disease), cervical    DVT (deep venous thrombosis) (HCC)    GERD (gastroesophageal reflux disease)    History of colonic polyps    Hypertension    Obesity    Primary hyperaldosteronism 11/06/2013   Stroke [employer])    Syncope    2014   Past Surgical History:  Procedure Laterality Date   COLONOSCOPY     FOOT SURGERY  1998   Implantable loop recorder placement  09/16/14   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 Mable   NASAL SINUS SURGERY  07/2006   Dr. Mable   TUBAL LIGATION      reports that she has never smoked. She has been exposed to tobacco smoke. She has never used smokeless tobacco. She reports current alcohol use. She reports that she does not use drugs. family history includes Allergic rhinitis in her brother, brother, father, mother, and sister; Arrhythmia in her brother; Asthma in her mother and sister; Cancer in her mother; Colon cancer in her brother; Congenital heart disease in her brother; Dementia in her mother; Diabetes in her sister; Heart attack in her brother; Lung disease in her son; Stroke in her brother. Allergies  Allergen Reactions   Iodine Nausea And Vomiting and Rash    Reaction: hot flashes   Ivp Dye [Iodinated Contrast Media] Nausea And Vomiting and Other (See Comments)    hot flashes   Promethazine -Codeine  Nausea And Vomiting   Tramadol  Other (See Comments)    Almost passed out   Current Outpatient Medications on File Prior to Visit  Medication Sig Dispense Refill   albuterol  (VENTOLIN  HFA) 108 (90 Base) MCG/ACT inhaler Inhale 1-2  puffs into the lungs every 6 (six) hours as needed for wheezing or shortness of breath. 1 each 3   alendronate  (FOSAMAX ) 70 MG tablet TAKE 1 TABLET(70 MG) BY MOUTH EVERY 7 DAYS WITH A FULL GLASS OF WATER AND ON AN EMPTY STOMACH 12 tablet 3   AMBULATORY NON FORMULARY MEDICATION Rollator walker with seat Dispense 1 Dx code: M54.16, R29.898 Use as needed 1 Device 0   amoxicillin -clavulanate (AUGMENTIN ) 875-125 MG tablet Take 1 tablet by mouth 2 (two) times daily. 20 tablet 0   aspirin  EC 81 MG tablet Take 1 tablet (81 mg total) by mouth daily. Swallow whole. 30 tablet 12   Biotin 89999 MCG TABS Take 10,000 mcg by mouth daily.     budesonide -glycopyrrolate-formoterol  (BREZTRI  AEROSPHERE) 160-9-4.8 MCG/ACT AERO inhaler Inhale 2 puffs into the lungs 2 (two) times daily. 3  each 4   cetirizine  (ZYRTEC ) 10 MG tablet Take 10 mg by mouth at bedtime.      Cholecalciferol (VITAMIN D3) 2000 units TABS Take 2,000 Units by mouth daily.     clotrimazole  (MYCELEX ) 10 MG troche Take 1 tablet (10 mg total) by mouth 5 (five) times daily. 30 Troche 5   Cyanocobalamin  (VITAMIN B-12 PO) Take 1,000 mcg by mouth daily.     famotidine  (PEPCID ) 40 MG tablet TAKE 1 TABLET(40 MG) BY MOUTH AT BEDTIME 30 tablet 1   ipratropium (ATROVENT ) 0.06 % nasal spray Place 2 sprays into both nostrils 3 (three) times daily. 15 mL 5   Mepolizumab  (NUCALA ) 100 MG/ML SOAJ Inject into the skin every 28 (twenty-eight) days.     montelukast  (SINGULAIR ) 10 MG tablet Take 1 tablet (10 mg total) by mouth at bedtime. 90 tablet 3   OVER THE COUNTER MEDICATION GO-LI     pantoprazole  (PROTONIX ) 40 MG tablet Take 1 tablet (40 mg total) by mouth 2 (two) times daily. 60 tablet 5   Respiratory Therapy Supplies (FLUTTER) DEVI Use as directed     Safety Seal Miscellaneous MISC 1 Application by Does not apply route in the morning. 30 mL 3   sodium chloride  (OCEAN) 0.65 % SOLN nasal spray Place 2 sprays into both nostrils as needed (dry nose).      Spacer/Aero-Holding Raguel FRENCH by Does not apply route.     spironolactone  (ALDACTONE ) 25 MG tablet TAKE 1 TABLET(25 MG) BY MOUTH DAILY 90 tablet 1   trospium  (SANCTURA ) 20 MG tablet Take 1 tablet (20 mg total) by mouth 2 (two) times daily. 60 tablet 6   warfarin (COUMADIN ) 5 MG tablet TAKE 1/2 TABLET BY MOUTH DAILY EXCEPT TAKE 1 TABLET ON TUESDAY OR AS DIRECTED BY COAGULATION CLINIC 60 tablet 1   Current Facility-Administered Medications on File Prior to Visit  Medication Dose Route Frequency Provider Last Rate Last Admin   mepolizumab  (NUCALA ) injection 100 mg  100 mg Subcutaneous Q28 days Iva Marty Saltness, MD   100 mg at 09/23/24 1701        ROS:  All others reviewed and negative.  Objective        PE:  BP 126/80 (BP Location: Left Arm, Patient Position:  Sitting, Cuff Size: Normal)   Pulse 76   Temp 97.6 F (36.4 C) (Oral)   Ht 5' 0.63 (1.54 m)   Wt 185 lb (83.9 kg)   LMP  (LMP Unknown)   SpO2 100%   BMI 35.38 kg/m                 Constitutional: Pt appears mild  ill               HENT: Head: NCAT.                Right Ear: External ear normal.                 Left Ear: External ear normal.                Eyes: . Pupils are equal, round, and reactive to light. Conjunctivae and EOM are normal               Nose: without d/c or deformity               Neck: Neck supple. Gross normal ROM               Cardiovascular: Normal rate and regular rhythm.                 Pulmonary/Chest: Effort normal and breath sounds without rales or wheezing.                Abd:  Soft, NT, ND, + BS, no organomegaly               Neurological: Pt is alert. At baseline orientation, motor grossly intact               Skin: Skin is warm. No rashes, no other new lesions, LE edema - none               Psychiatric: Pt behavior is normal without agitation   Micro: none  Cardiac tracings I have personally interpreted today:  none  Pertinent Radiological findings (summarize): none   Lab Results  Component Value Date   WBC 8.1 10/05/2024   HGB 10.8 (L) 10/05/2024   HCT 34.6 (L) 10/05/2024   PLT 239 10/05/2024   GLUCOSE 72 10/09/2024   CHOL 145 07/18/2024   TRIG 66.0 07/18/2024   HDL 57.30 07/18/2024   LDLCALC 74 07/18/2024   ALT 9 10/05/2024   AST 24 10/05/2024   NA 136 10/09/2024   K 4.2 10/09/2024   CL 101 10/09/2024   CREATININE 0.96 10/09/2024   BUN 12 10/09/2024   CO2 29 10/09/2024   TSH 1.55 07/18/2024   INR 2.8 09/19/2024   HGBA1C 6.1 07/18/2024   Assessment/Plan:  Oletha Tolson is a 73 y.o. Black or African American [2] female with  has a past medical history of Allergic rhinitis, Allergy , Asthma, DJD (degenerative joint disease), cervical, DVT (deep venous thrombosis) (HCC), GERD (gastroesophageal reflux disease), History of colonic  polyps, Hypertension, Obesity, Primary hyperaldosteronism (11/06/2013), Stroke (HCC), and Syncope.  AKI (acute kidney injury) Lab Results  Component Value Date   CREATININE 1.33 (H) 10/05/2024   Very mild increased overall, cont to avoid nephrotoxins, cont oral fluids and recheck BMP;  Essential hypertension BP Readings from Last 3 Encounters:  10/09/24 126/80  10/05/24 114/78  08/12/24 130/70   Stable, pt to continue medical treatment  - diet, wt control   Productive cough Mild to mod, can't r/o pna, declines cxr, for antibx course zpack,  to f/u any worsening symptoms or concerns  Low back pain Mild to mod recurrent, now for gabapentin  100 tid,  to f/u any worsening symptoms or concerns  Followup: No follow-ups on file.  Lynwood Rush, MD 10/12/2024 12:20 PM [facility]

## 2024-10-09 NOTE — Patient Instructions (Addendum)
 Please take all new medication as prescribed  - the antibiotic  Please continue all other medications as before, and refills have been done if requested.  Please have the pharmacy call with any other refills you may need.  Please continue your efforts at being more active, low cholesterol diet, and weight control.  Please keep your appointments with your specialists as you may have planned  Please go to the LAB at the blood drawing area for the tests to be done - just the kidney blood testing this time  You will be contacted by phone if any changes need to be made immediately.  Otherwise, you will receive a letter about your results with an explanation, but please check with MyChart first.  Please make an Appointment to return in 4 months, or sooner if needed

## 2024-10-12 ENCOUNTER — Encounter: Payer: Self-pay | Admitting: Dermatology

## 2024-10-12 DIAGNOSIS — R058 Other specified cough: Secondary | ICD-10-CM | POA: Insufficient documentation

## 2024-10-12 NOTE — Assessment & Plan Note (Signed)
 BP Readings from Last 3 Encounters:  10/09/24 126/80  10/05/24 114/78  08/12/24 130/70   Stable, pt to continue medical treatment  - diet, wt control

## 2024-10-12 NOTE — Assessment & Plan Note (Signed)
 Mild to mod, can't r/o pna, declines cxr, for antibx course zpack,  to f/u any worsening symptoms or concerns

## 2024-10-12 NOTE — Assessment & Plan Note (Signed)
 Mild to mod recurrent, now for gabapentin  100 tid,  to f/u any worsening symptoms or concerns

## 2024-10-13 ENCOUNTER — Telehealth: Payer: Self-pay

## 2024-10-13 NOTE — Telephone Encounter (Signed)
 Pt LVM requesting to move coumadin  clinic apt from tomorrow to 11/14 due to the cold weather.  LVM for pt advising apt could be moved to 11/14 at [facility] at 3 pm. Advised to contact coumadin  clinic to confirm.

## 2024-10-14 ENCOUNTER — Ambulatory Visit

## 2024-10-14 ENCOUNTER — Ambulatory Visit: Payer: Self-pay

## 2024-10-14 NOTE — Telephone Encounter (Signed)
Left another VM for pt to return call

## 2024-10-14 NOTE — Telephone Encounter (Signed)
 FYI Only or Action Required?: FYI only for provider: appointment scheduled on 10/15/24.  Patient was last seen in primary care on 10/09/2024 by Norleen Lynwood ORN, MD.  Called Nurse Triage reporting Headache.  Symptoms began several weeks ago.  Interventions attempted: Nothing.  Symptoms are: unchanged.  Triage Disposition: See Physician Within 24 Hours  Patient/caregiver understands and will follow disposition?: Yes          Copied from CRM [id number]. Topic: Clinical - Red Word Triage >> Oct 14, 2024  3:53 PM Andrea Mason wrote: Red Word that prompted transfer to Nurse Triage: Head in pain, on left hand side. Not constant, but uncomfortable. Not use to it hurting. Reason for Disposition  [1] MODERATE headache (e.g., interferes with normal activities) AND [2] present > 24 hours AND [3] unexplained  (Exceptions: Pain medicines not tried, typical migraine, or headache part of viral illness.)  Answer Assessment - Initial Assessment Questions 1. LOCATION: Where does it hurt?      L side 2. ONSET: When did the headache start? (e.g., minutes, hours, days)      A few weeks 3. PATTERN: Does the pain come and go, or has it been constant since it started?     Comes and goes 4. SEVERITY: How bad is the pain? and What does it keep you from doing?  (e.g., Scale 1-10; mild, moderate, or severe)     5-6/10 5. RECURRENT SYMPTOM: Have you ever had headaches before? If Yes, ask: When was the last time? and What happened that time?      No, not like this 6. CAUSE: What do you think is causing the headache?     Unknown, unsure if r/t ears/eyes or not 7. MIGRAINE: Have you been diagnosed with migraine headaches? If Yes, ask: Is this headache similar?      denies 8. HEAD INJURY: Has there been any recent injury to your head?      denies 9. OTHER SYMPTOMS: Do you have any other symptoms? (e.g., fever, stiff neck, eye pain, sore throat, cold symptoms)     denies 10.  PREGNANCY: Is there any chance you are pregnant? When was your last menstrual period?       N/a  Protocols used: Headache-A-AH

## 2024-10-15 ENCOUNTER — Ambulatory Visit (INDEPENDENT_AMBULATORY_CARE_PROVIDER_SITE_OTHER): Admitting: Internal Medicine

## 2024-10-15 VITALS — BP 122/88 | HR 68 | Temp 98.6°F | Ht 60.0 in | Wt 186.0 lb

## 2024-10-15 DIAGNOSIS — H6692 Otitis media, unspecified, left ear: Secondary | ICD-10-CM | POA: Diagnosis not present

## 2024-10-15 DIAGNOSIS — R519 Headache, unspecified: Secondary | ICD-10-CM | POA: Diagnosis not present

## 2024-10-15 DIAGNOSIS — J3089 Other allergic rhinitis: Secondary | ICD-10-CM

## 2024-10-15 DIAGNOSIS — J302 Other seasonal allergic rhinitis: Secondary | ICD-10-CM

## 2024-10-15 DIAGNOSIS — I10 Essential (primary) hypertension: Secondary | ICD-10-CM

## 2024-10-15 DIAGNOSIS — E559 Vitamin D deficiency, unspecified: Secondary | ICD-10-CM | POA: Diagnosis not present

## 2024-10-15 MED ORDER — CEFDINIR 300 MG PO CAPS: 300.0000 mg | ORAL_CAPSULE | Freq: Two times a day (BID) | ORAL | 0 refills | Status: DC

## 2024-10-15 MED ORDER — PREDNISONE 10 MG PO TABS: ORAL_TABLET | ORAL | 0 refills | Status: AC

## 2024-10-15 NOTE — Telephone Encounter (Signed)
 Pt has appt scheduled.

## 2024-10-15 NOTE — Patient Instructions (Signed)
 Please take all new medication as prescribed - the antibiotic, and prednisone   Please continue all other medications as before, and refills have been done if requested.  Please have the pharmacy call with any other refills you may need.  Please keep your appointments with your specialists as you may have planned  Please go to the LAB at the blood drawing area for the tests to be done  You will be contacted by phone if any changes need to be made immediately.  Otherwise, you will receive a letter about your results with an explanation, but please check with MyChart first.

## 2024-10-15 NOTE — Progress Notes (Unsigned)
 Patient ID: Andrea Mason, female   DOB: 1950-12-25, 73 y.o.   MRN: [id number]        Chief Complaint: follow up left temple pain, allergies, low vit d, htn, left ear pain       HPI:  Andrea Mason is a 72 y.o. female here with c/o unusual for her left temple pain and soreness for 1 wk without swelling or skin change, but is constant, no radiation and Pt denies chest pain, increased sob or doe, wheezing, orthopnea, PND, increased LE swelling, palpitations, dizziness or syncope.   Pt denies polydipsia, polyuria, or new focal neuro s/s.   Does have several wks ongoing nasal allergy  symptoms with clearish congestion, itch and sneezing, without fever, pain, ST, cough, swelling or wheezing.       Wt Readings from Last 3 Encounters:  10/15/24 186 lb (84.4 kg)  10/09/24 185 lb (83.9 kg)  08/12/24 183 lb 14.4 oz (83.4 kg)   BP Readings from Last 3 Encounters:  10/15/24 122/88  10/09/24 126/80  10/05/24 114/78         Past Medical History:  Diagnosis Date   Allergic rhinitis    Allergy     Asthma    DJD (degenerative joint disease), cervical    DVT (deep venous thrombosis) (HCC)    GERD (gastroesophageal reflux disease)    History of colonic polyps    Hypertension    Obesity    Primary hyperaldosteronism 11/06/2013   Stroke [facility])    Syncope    2014   Past Surgical History:  Procedure Laterality Date   COLONOSCOPY     FOOT SURGERY  1998   Implantable loop recorder placement  09/16/14   MDT LINQ implanted by Dr Kelsie for cryptogenic stroke, RIO II protocol   LOOP RECORDER REMOVAL N/A 10/02/2017   Procedure: LOOP RECORDER REMOVAL;  Surgeon: Kelsie Agent, MD;  Location: [facility];  Service: Cardiovascular;  Laterality: N/A;   NASAL POLYP SURGERY  07/2006   x 2 with Dr. Mable   NASAL SINUS SURGERY  07/2006   Dr. Mable   TUBAL LIGATION      reports that she has never smoked. She has been exposed to tobacco smoke. She has never used smokeless tobacco. She reports  current alcohol use. She reports that she does not use drugs. family history includes Allergic rhinitis in her brother, brother, father, mother, and sister; Arrhythmia in her brother; Asthma in her mother and sister; Cancer in her mother; Colon cancer in her brother; Congenital heart disease in her brother; Dementia in her mother; Diabetes in her sister; Heart attack in her brother; Lung disease in her son; Stroke in her brother. Allergies  Allergen Reactions   Iodine Nausea And Vomiting and Rash    Reaction: hot flashes   Ivp Dye [Iodinated Contrast Media] Nausea And Vomiting and Other (See Comments)    hot flashes   Promethazine -Codeine  Nausea And Vomiting   Tramadol  Other (See Comments)    Almost passed out   Current Outpatient Medications on File Prior to Visit  Medication Sig Dispense Refill   albuterol  (VENTOLIN  HFA) 108 (90 Base) MCG/ACT inhaler Inhale 1-2 puffs into the lungs every 6 (six) hours as needed for wheezing or shortness of breath. 1 each 3   alendronate  (FOSAMAX ) 70 MG tablet TAKE 1 TABLET(70 MG) BY MOUTH EVERY 7 DAYS WITH A FULL GLASS OF WATER AND ON AN EMPTY STOMACH 12 tablet 3   AMBULATORY NON FORMULARY MEDICATION Rollator walker with  seat Dispense 1 Dx code: M54.16, R29.898 Use as needed 1 Device 0   aspirin  EC 81 MG tablet Take 1 tablet (81 mg total) by mouth daily. Swallow whole. 30 tablet 12   Biotin 89999 MCG TABS Take 10,000 mcg by mouth daily.     budesonide -glycopyrrolate-formoterol  (BREZTRI  AEROSPHERE) 160-9-4.8 MCG/ACT AERO inhaler Inhale 2 puffs into the lungs 2 (two) times daily. 3 each 4   cetirizine  (ZYRTEC ) 10 MG tablet Take 10 mg by mouth at bedtime.      Cholecalciferol (VITAMIN D3) 2000 units TABS Take 2,000 Units by mouth daily.     clotrimazole  (MYCELEX ) 10 MG troche Take 1 tablet (10 mg total) by mouth 5 (five) times daily. 30 Troche 5   Cyanocobalamin  (VITAMIN B-12 PO) Take 1,000 mcg by mouth daily.     famotidine  (PEPCID ) 40 MG tablet TAKE 1  TABLET(40 MG) BY MOUTH AT BEDTIME 30 tablet 1   gabapentin  (NEURONTIN ) 100 MG capsule Take 1-3 capsules (100-300 mg total) by mouth 3 (three) times daily as needed. 90 capsule 3   ipratropium (ATROVENT ) 0.06 % nasal spray Place 2 sprays into both nostrils 3 (three) times daily. 15 mL 5   Mepolizumab  (NUCALA ) 100 MG/ML SOAJ Inject into the skin every 28 (twenty-eight) days.     montelukast  (SINGULAIR ) 10 MG tablet Take 1 tablet (10 mg total) by mouth at bedtime. 90 tablet 3   OVER THE COUNTER MEDICATION GO-LI     pantoprazole  (PROTONIX ) 40 MG tablet Take 1 tablet (40 mg total) by mouth 2 (two) times daily. 60 tablet 5   Respiratory Therapy Supplies (FLUTTER) DEVI Use as directed     Safety Seal Miscellaneous MISC 1 Application by Does not apply route in the morning. 30 mL 3   sodium chloride  (OCEAN) 0.65 % SOLN nasal spray Place 2 sprays into both nostrils as needed (dry nose).      Spacer/Aero-Holding Raguel FRENCH by Does not apply route.     spironolactone  (ALDACTONE ) 25 MG tablet TAKE 1 TABLET(25 MG) BY MOUTH DAILY 90 tablet 1   trospium  (SANCTURA ) 20 MG tablet Take 1 tablet (20 mg total) by mouth 2 (two) times daily. 60 tablet 6   warfarin (COUMADIN ) 5 MG tablet TAKE 1/2 TABLET BY MOUTH DAILY EXCEPT TAKE 1 TABLET ON TUESDAY OR AS DIRECTED BY COAGULATION CLINIC 60 tablet 1   Current Facility-Administered Medications on File Prior to Visit  Medication Dose Route Frequency Provider Last Rate Last Admin   mepolizumab  (NUCALA ) injection 100 mg  100 mg Subcutaneous Q28 days Iva Marty Saltness, MD   100 mg at 09/23/24 1701        ROS:  All others reviewed and negative.  Objective        PE:  BP 122/88   Pulse 68   Temp 98.6 F (37 C) (Temporal)   Ht 5' (1.524 m)   Wt 186 lb (84.4 kg)   LMP  (LMP Unknown)   SpO2 97%   BMI 36.33 kg/m                 Constitutional: Pt appears in NAD               HENT: Head: NCAT.                Right Ear: External ear normal.                 Left  Ear: External ear normal.  Left temple mild tender,  no swelling or skin change, also left TM severe erythema               Eyes: . Pupils are equal, round, and reactive to light. Conjunctivae and EOM are normal               Nose: without d/c or deformity               Neck: Neck supple. Gross normal ROM               Cardiovascular: Normal rate and regular rhythm.                 Pulmonary/Chest: Effort normal and breath sounds without rales or wheezing.                Abd:  Soft, NT, ND, + BS, no organomegaly               Neurological: Pt is alert. At baseline orientation, motor grossly intact               Skin: Skin is warm. No rashes, no other new lesions, LE edema - none               Psychiatric: Pt behavior is normal without agitation   Micro: none  Cardiac tracings I have personally interpreted today:  none  Pertinent Radiological findings (summarize): none   Lab Results  Component Value Date   WBC 5.0 10/16/2024   HGB 11.7 (L) 10/16/2024   HCT 35.4 (L) 10/16/2024   PLT 240.0 10/16/2024   GLUCOSE 98 10/16/2024   CHOL 145 07/18/2024   TRIG 66.0 07/18/2024   HDL 57.30 07/18/2024   LDLCALC 74 07/18/2024   ALT 9 10/05/2024   AST 24 10/05/2024   NA 133 (L) 10/16/2024   K 5.0 10/16/2024   CL 99 10/16/2024   CREATININE 1.10 10/16/2024   BUN 13 10/16/2024   CO2 27 10/16/2024   TSH 1.55 07/18/2024   INR 3.4 (A) 10/17/2024   HGBA1C 6.1 07/18/2024   Assessment/Plan:  Andrea Mason is a 73 y.o. Black or African American [2] female with  has a past medical history of Allergic rhinitis, Allergy , Asthma, DJD (degenerative joint disease), cervical, DVT (deep venous thrombosis) (HCC), GERD (gastroesophageal reflux disease), History of colonic polyps, Hypertension, Obesity, Primary hyperaldosteronism (11/06/2013), Stroke (HCC), and Syncope.  Vitamin D  deficiency Last vitamin D  Lab Results  Component Value Date   VD25OH 43.12 07/18/2024   Stable, cont oral  replacement   Seasonal and perennial allergic rhinitis Mild to mod, for prednisone  taper, to f/u any worsening symptoms or concern  Essential hypertension BP Readings from Last 3 Encounters:  10/15/24 122/88  10/09/24 126/80  10/05/24 114/78   Stable, pt to continue medical treatment  - diet, wt control   Temporal pain mod, can't r/o temporal arteritis, for esr, prednisone  taper, consider referral to vascular sugury,   to f/u any worsening symptoms or concerns  Left otitis media Mild to mod, for antibx course ceftin 250 bid course,  to f/u any worsening symptoms or concerns  Followup: Return if symptoms worsen or fail to improve.  Lynwood Rush, MD 10/17/2024 7:49 PM  [facility] - [facility]

## 2024-10-16 ENCOUNTER — Other Ambulatory Visit: Payer: Self-pay | Admitting: Internal Medicine

## 2024-10-16 ENCOUNTER — Ambulatory Visit: Payer: Self-pay | Admitting: Internal Medicine

## 2024-10-16 ENCOUNTER — Other Ambulatory Visit (INDEPENDENT_AMBULATORY_CARE_PROVIDER_SITE_OTHER)

## 2024-10-16 DIAGNOSIS — R519 Headache, unspecified: Secondary | ICD-10-CM

## 2024-10-16 DIAGNOSIS — R700 Elevated erythrocyte sedimentation rate: Secondary | ICD-10-CM

## 2024-10-16 LAB — CBC WITH DIFFERENTIAL/PLATELET
Basophils Absolute: 0 K/uL (ref 0.0–0.1)
Basophils Relative: 0.9 % (ref 0.0–3.0)
Eosinophils Absolute: 0.1 K/uL (ref 0.0–0.7)
Eosinophils Relative: 1.5 % (ref 0.0–5.0)
HCT: 35.4 % — ABNORMAL LOW (ref 36.0–46.0)
Hemoglobin: 11.7 g/dL — ABNORMAL LOW (ref 12.0–15.0)
Lymphocytes Relative: 14.2 % (ref 12.0–46.0)
Lymphs Abs: 0.7 K/uL (ref 0.7–4.0)
MCHC: 33 g/dL (ref 30.0–36.0)
MCV: 89.9 fl (ref 78.0–100.0)
Monocytes Absolute: 0.2 K/uL (ref 0.1–1.0)
Monocytes Relative: 3.3 % (ref 3.0–12.0)
Neutro Abs: 4 K/uL (ref 1.4–7.7)
Neutrophils Relative %: 80.1 % — ABNORMAL HIGH (ref 43.0–77.0)
Platelets: 240 K/uL (ref 150.0–400.0)
RBC: 3.94 Mil/uL (ref 3.87–5.11)
RDW: 14.4 % (ref 11.5–15.5)
WBC: 5 K/uL (ref 4.0–10.5)

## 2024-10-16 LAB — SEDIMENTATION RATE: Sed Rate: 67 mm/h — ABNORMAL HIGH (ref 0–30)

## 2024-10-16 LAB — BASIC METABOLIC PANEL WITH GFR
BUN: 13 mg/dL (ref 6–23)
CO2: 27 meq/L (ref 19–32)
Calcium: 9.6 mg/dL (ref 8.4–10.5)
Chloride: 99 meq/L (ref 96–112)
Creatinine, Ser: 1.1 mg/dL (ref 0.40–1.20)
GFR: 50.46 mL/min — ABNORMAL LOW (ref >60.00)
Glucose, Bld: 98 mg/dL (ref 70–99)
Potassium: 5 meq/L (ref 3.5–5.1)
Sodium: 133 meq/L — ABNORMAL LOW (ref 135–145)

## 2024-10-17 ENCOUNTER — Encounter: Payer: Self-pay | Admitting: Internal Medicine

## 2024-10-17 ENCOUNTER — Ambulatory Visit (INDEPENDENT_AMBULATORY_CARE_PROVIDER_SITE_OTHER)

## 2024-10-17 DIAGNOSIS — R519 Headache, unspecified: Secondary | ICD-10-CM | POA: Insufficient documentation

## 2024-10-17 DIAGNOSIS — H6692 Otitis media, unspecified, left ear: Secondary | ICD-10-CM | POA: Insufficient documentation

## 2024-10-17 DIAGNOSIS — Z7901 Long term (current) use of anticoagulants: Secondary | ICD-10-CM

## 2024-10-17 LAB — POCT INR: INR: 3.4 — AB (ref 2.0–3.0)

## 2024-10-17 NOTE — Progress Notes (Signed)
 Indication: embolic stroke. Hold warfarin today and then change weekly dose to take 1/2 tablet daily,. Recheck in 3 week.

## 2024-10-17 NOTE — Assessment & Plan Note (Signed)
 Mild to mod, for prednisone  taper, to f/u any worsening symptoms or concern

## 2024-10-17 NOTE — Patient Instructions (Addendum)
 Pre visit review using our clinic review tool, if applicable. No additional management support is needed unless otherwise documented below in the visit note.  Hold warfarin today and then change weekly dose to take 1/2 tablet daily,. Recheck in 3 week.

## 2024-10-17 NOTE — Assessment & Plan Note (Signed)
 Mild to mod, for antibx course ceftin 250 bid course,  to f/u any worsening symptoms or concerns

## 2024-10-17 NOTE — Assessment & Plan Note (Signed)
 Last vitamin D  Lab Results  Component Value Date   VD25OH 43.12 07/18/2024   Stable, cont oral replacement

## 2024-10-17 NOTE — Assessment & Plan Note (Signed)
 mod, can't r/o temporal arteritis, for esr, prednisone  taper, consider referral to vascular sugury,   to f/u any worsening symptoms or concerns

## 2024-10-17 NOTE — Assessment & Plan Note (Signed)
 BP Readings from Last 3 Encounters:  10/15/24 122/88  10/09/24 126/80  10/05/24 114/78   Stable, pt to continue medical treatment  - diet, wt control

## 2024-10-21 ENCOUNTER — Ambulatory Visit

## 2024-10-23 ENCOUNTER — Ambulatory Visit: Admitting: Vascular Surgery

## 2024-10-24 ENCOUNTER — Ambulatory Visit: Payer: Medicare HMO

## 2024-10-24 VITALS — Ht 60.63 in | Wt 186.0 lb

## 2024-10-24 DIAGNOSIS — Z0000 Encounter for general adult medical examination without abnormal findings: Secondary | ICD-10-CM

## 2024-10-24 NOTE — Patient Instructions (Signed)
 Andrea Mason,  Thank you for taking the time for your Medicare Wellness Visit. I appreciate your continued commitment to your health goals. Please review the care plan we discussed, and feel free to reach out if I can assist you further.  Please note that Annual Wellness Visits do not include a physical exam. Some assessments may be limited, especially if the visit was conducted virtually. If needed, we may recommend an in-person follow-up with your provider.  Ongoing Care Seeing your primary care provider every 3 to 6 months helps us  monitor your health and provide consistent, personalized care. Keep up the good work.  Referrals If a referral was made during today's visit and you haven't received any updates within two weeks, please contact the referred provider directly to check on the status.  Recommended Screenings:  Health Maintenance  Topic Date Due   Zoster (Shingles) Vaccine (1 of 2) 01/07/2003   Flu Shot  03/03/2025*   DTaP/Tdap/Td vaccine (3 - Td or Tdap) 08/24/2025   Medicare Annual Wellness Visit  10/24/2025   Breast Cancer Screening  02/10/2026   Colon Cancer Screening  06/18/2029   Pneumococcal Vaccine for age over 43  Completed   Osteoporosis screening with Bone Density Scan  Completed   Hepatitis C Screening  Completed   Meningitis B Vaccine  Aged Out   COVID-19 Vaccine  Discontinued  *Topic was postponed. The date shown is not the original due date.       10/23/2024    9:00 PM  Advanced Directives  Does Patient Have a Medical Advance Directive? No    Vision: Annual vision screenings are recommended for early detection of glaucoma, cataracts, and diabetic retinopathy. These exams can also reveal signs of chronic conditions such as diabetes and high blood pressure.  Dental: Annual dental screenings help detect early signs of oral cancer, gum disease, and other conditions linked to overall health, including heart disease and diabetes.  Please see the attached  documents for additional preventive care recommendations.

## 2024-10-24 NOTE — Progress Notes (Signed)
 Chief Complaint  Patient presents with   Medicare Wellness     Subjective:   Andrea Mason is a 73 y.o. female who presents for a Medicare Annual Wellness Visit.  Allergies (verified) Iodine, Ivp dye [iodinated contrast media], Promethazine -codeine , and Tramadol    History: Past Medical History:  Diagnosis Date   Allergic rhinitis    Allergy     Asthma    DJD (degenerative joint disease), cervical    DVT (deep venous thrombosis) (HCC)    GERD (gastroesophageal reflux disease)    History of colonic polyps    Hypertension    Obesity    Primary hyperaldosteronism 11/06/2013   Stroke [facility])    Syncope    2014   Past Surgical History:  Procedure Laterality Date   COLONOSCOPY     FOOT SURGERY  1998   Implantable loop recorder placement  09/16/14   MDT LINQ implanted by Dr Kelsie for cryptogenic stroke, RIO II protocol   LOOP RECORDER REMOVAL N/A 10/02/2017   Procedure: LOOP RECORDER REMOVAL;  Surgeon: Kelsie Agent, MD;  Location: [facility]VAS[facility];  Service: Cardiovascular;  Laterality: N/A;   NASAL POLYP SURGERY  07/2006   x 2 with Dr. Mable   NASAL SINUS SURGERY  07/2006   Dr. Mable   TUBAL LIGATION     Family History  Problem Relation Age of Onset   Asthma Mother    Allergic rhinitis Mother    Cancer Mother    Dementia Mother    Allergic rhinitis Father    Asthma Sister    Allergic rhinitis Sister    Diabetes Sister    Allergic rhinitis Brother    Colon cancer Brother    Stroke Brother    Arrhythmia Brother    Allergic rhinitis Brother    Congenital heart disease Brother    Heart attack Brother    Lung disease Son        Sarcoid vs ARDS related fibrosis, required 2 double lung transplants   Esophageal cancer Neg Hx    Rectal cancer Neg Hx    Stomach cancer Neg Hx    Social History   Occupational History   Occupation: museum/gallery curator    Comment: [employer]   Occupation: advertising account executive  Tobacco Use   Smoking status: Never     Passive exposure: Current (on sister's clothing bc sister is a smoker)   Smokeless tobacco: Never  Vaping Use   Vaping status: Never Used  Substance and Sexual Activity   Alcohol use: Yes    Comment: socially wine   Drug use: No   Sexual activity: Not Currently    Birth control/protection: Post-menopausal   Tobacco Counseling Counseling given: Not Answered  SDOH Screenings   Food Insecurity: Food Insecurity Present (10/23/2024)  Housing: Unknown (10/23/2024)  Transportation Needs: No Transportation Needs (10/23/2024)  Utilities: Not At Risk (10/24/2024)  Alcohol Screen: Low Risk  (10/19/2023)  Depression (PHQ2-9): Low Risk  (10/24/2024)  Financial Resource Strain: Medium Risk (10/23/2024)  Physical Activity: Insufficiently Active (10/23/2024)  Social Connections: Moderately Integrated (10/23/2024)  Stress: No Stress Concern Present (10/23/2024)  Tobacco Use: Medium Risk (10/17/2024)  Health Literacy: Adequate Health Literacy (10/19/2023)   See flowsheets for full screening details  Depression Screen PHQ 2 & 9 Depression Scale- Over the past 2 weeks, how often have you been bothered by any of the following problems? Little interest or pleasure in doing things: 0 Feeling down, depressed, or hopeless (PHQ Adolescent also includes...irritable): 0 PHQ-2 Total  Score: 0 Trouble falling or staying asleep, or sleeping too much: 0 Feeling tired or having little energy: 0 Poor appetite or overeating (PHQ Adolescent also includes...weight loss): 0 Feeling bad about yourself - or that you are a failure or have let yourself or your family down: 0 Trouble concentrating on things, such as reading the newspaper or watching television (PHQ Adolescent also includes...like school work): 0 Moving or speaking so slowly that other people could have noticed. Or the opposite - being so fidgety or restless that you have been moving around a lot more than usual: 0 Thoughts that you would be better off  dead, or of hurting yourself in some way: 0 PHQ-9 Total Score: 0 If you checked off any problems, how difficult have these problems made it for you to do your work, take care of things at home, or get along with other people?: Not difficult at all  Depression Treatment Depression Interventions/Treatment : EYV7-0 Score <4 Follow-up Not Indicated     Goals Addressed   None    Visit info / Clinical Intake: Medicare Wellness Visit Type:: Subsequent Annual Wellness Visit Persons participating in visit:: patient Medicare Wellness Visit Mode:: Video Because this visit was a virtual/telehealth visit:: vitals recorded from last visit If Telephone or Video please confirm:: I connected with the patient using audio enabled telemedicine application and verified that I am speaking with the correct person using two identifiers; I discussed the limitations of evaluation and management by telemedicine; The patient expressed understanding and agreed to proceed Patient Location:: Home Provider Location:: Office Information given by:: patient Interpreter Needed?: No Pre-visit prep was completed: no AWV questionnaire completed by patient prior to visit?: yes Date:: 10/23/24 Living arrangements:: (!) lives alone Patient's Overall Health Status Rating: good Typical amount of pain: some (rt ankle) Does pain affect daily life?: no Are you currently prescribed opioids?: no  Dietary Habits and Nutritional Risks How many meals a day?: 2 Eats fruit and vegetables daily?: (!) no Most meals are obtained by: preparing own meals In the last 2 weeks, have you had any of the following?: none Diabetic:: no  Functional Status Activities of Daily Living (to include ambulation/medication): (Patient-Rptd) Independent Ambulation: Independent with device- listed below Home Assistive Devices/Equipment: Eyeglasses; Walker (specify Type) (uses a walker sometime) Medication Administration: Independent Home Management:  (Patient-Rptd) Independent Manage your own finances?: yes Primary transportation is: driving Concerns about vision?: no *vision screening is required for WTM* Concerns about hearing?: no  Fall Screening Falls in the past year?: (Patient-Rptd) 1 Number of falls in past year: (Patient-Rptd) 0 Was there an injury with Fall?: (Patient-Rptd) 1 Fall Risk Category Calculator: (Patient-Rptd) 2 Patient Fall Risk Level: (Patient-Rptd) Moderate Fall Risk  Fall Risk Patient at Risk for Falls Due to: Impaired balance/gait Fall risk Follow up: Falls evaluation completed; Falls prevention discussed  Home and Transportation Safety: All rugs have non-skid backing?: yes All stairs or steps have railings?: N/A, no stairs Grab bars in the bathtub or shower?: (!) no Have non-skid surface in bathtub or shower?: yes Good home lighting?: yes Regular seat belt use?: yes Hospital stays in the last year:: no  Cognitive Assessment Difficulty concentrating, remembering, or making decisions? : no Will 6CIT or Mini Cog be Completed: no 6CIT or Mini Cog Declined: patient alert, oriented, able to answer questions appropriately and recall recent events  Advance Directives (For Healthcare) Does Patient Have a Medical Advance Directive?: No  Reviewed/Updated  Reviewed/Updated: Reviewed All (Medical, Surgical, Family, Medications, Allergies, Care Teams,  Patient Goals)        Objective:    Today's Vitals   10/24/24 1425  Weight: 186 lb (84.4 kg)  Height: 5' 0.63 (1.54 m)   Body mass index is 35.57 kg/m.  Current Medications (verified) Outpatient Encounter Medications as of 10/24/2024  Medication Sig   albuterol  (VENTOLIN  HFA) 108 (90 Base) MCG/ACT inhaler Inhale 1-2 puffs into the lungs every 6 (six) hours as needed for wheezing or shortness of breath.   alendronate  (FOSAMAX ) 70 MG tablet TAKE 1 TABLET(70 MG) BY MOUTH EVERY 7 DAYS WITH A FULL GLASS OF WATER AND ON AN EMPTY STOMACH   AMBULATORY NON  FORMULARY MEDICATION Rollator walker with seat Dispense 1 Dx code: M54.16, R29.898 Use as needed   aspirin  EC 81 MG tablet Take 1 tablet (81 mg total) by mouth daily. Swallow whole.   Biotin 89999 MCG TABS Take 10,000 mcg by mouth daily.   budesonide -glycopyrrolate-formoterol  (BREZTRI  AEROSPHERE) 160-9-4.8 MCG/ACT AERO inhaler Inhale 2 puffs into the lungs 2 (two) times daily.   cefdinir  (OMNICEF ) 300 MG capsule Take 1 capsule (300 mg total) by mouth 2 (two) times daily.   cetirizine  (ZYRTEC ) 10 MG tablet Take 10 mg by mouth at bedtime.    Cholecalciferol (VITAMIN D3) 2000 units TABS Take 2,000 Units by mouth daily.   clotrimazole  (MYCELEX ) 10 MG troche Take 1 tablet (10 mg total) by mouth 5 (five) times daily.   Cyanocobalamin  (VITAMIN B-12 PO) Take 1,000 mcg by mouth daily.   famotidine  (PEPCID ) 40 MG tablet TAKE 1 TABLET(40 MG) BY MOUTH AT BEDTIME   gabapentin  (NEURONTIN ) 100 MG capsule Take 1-3 capsules (100-300 mg total) by mouth 3 (three) times daily as needed.   ipratropium (ATROVENT ) 0.06 % nasal spray Place 2 sprays into both nostrils 3 (three) times daily.   Mepolizumab  (NUCALA ) 100 MG/ML SOAJ Inject into the skin every 28 (twenty-eight) days.   montelukast  (SINGULAIR ) 10 MG tablet Take 1 tablet (10 mg total) by mouth at bedtime.   OVER THE COUNTER MEDICATION GO-LI   pantoprazole  (PROTONIX ) 40 MG tablet Take 1 tablet (40 mg total) by mouth 2 (two) times daily.   predniSONE  (DELTASONE ) 10 MG tablet 3 tabs by mouth per day for 3 days,2tabs per day for 3 days,1tab per day for 3 days   Respiratory Therapy Supplies (FLUTTER) DEVI Use as directed   Safety Seal Miscellaneous MISC 1 Application by Does not apply route in the morning.   sodium chloride  (OCEAN) 0.65 % SOLN nasal spray Place 2 sprays into both nostrils as needed (dry nose).    Spacer/Aero-Holding Raguel FRENCH by Does not apply route.   spironolactone  (ALDACTONE ) 25 MG tablet TAKE 1 TABLET(25 MG) BY MOUTH DAILY   trospium   (SANCTURA ) 20 MG tablet Take 1 tablet (20 mg total) by mouth 2 (two) times daily.   warfarin (COUMADIN ) 5 MG tablet TAKE 1/2 TABLET BY MOUTH DAILY EXCEPT TAKE 1 TABLET ON TUESDAY OR AS DIRECTED BY COAGULATION CLINIC   Facility-Administered Encounter Medications as of 10/24/2024  Medication   mepolizumab  (NUCALA ) injection 100 mg   Hearing/Vision screen Hearing Screening - Comments:: Denies hearing difficulties   Vision Screening - Comments:: Wears eyeglasses/ [place]/Not UTD Immunizations and Health Maintenance Health Maintenance  Topic Date Due   Zoster Vaccines- Shingrix (1 of 2) 01/07/2003   Medicare Annual Wellness (AWV)  10/18/2024   Influenza Vaccine  03/03/2025 (Originally 07/04/2024)   DTaP/Tdap/Td (3 - Td or Tdap) 08/24/2025   Mammogram  02/10/2026   Colonoscopy  06/18/2029   Pneumococcal Vaccine: 50+ Years  Completed   Bone Density Scan  Completed   Hepatitis C Screening  Completed   Meningococcal B Vaccine  Aged Out   COVID-19 Vaccine  Discontinued        Assessment/Plan:  This is a routine wellness examination for Andrea Mason.  Patient Care Team: Norleen Lynwood ORN, MD as PCP - General Rosemarie Eather RAMAN, MD as Consulting Physician (Neurology)  I have personally reviewed and noted the following in the patient's chart:   Medical and social history Use of alcohol, tobacco or illicit drugs  Current medications and supplements including opioid prescriptions. Functional ability and status Nutritional status Physical activity Advanced directives List of other physicians Hospitalizations, surgeries, and ER visits in previous 12 months Vitals Screenings to include cognitive, depression, and falls Referrals and appointments  No orders of the defined types were placed in this encounter.  In addition, I have reviewed and discussed with patient certain preventive protocols, quality metrics, and best practice recommendations. A written personalized care plan for preventive services  as well as general preventive health recommendations were provided to patient.   Tasha Diaz L Chaniya Genter, CMA   10/24/2024   No follow-ups on file.  After Visit Summary: (MyChart) Due to this being a telephonic visit, the after visit summary with patients personalized plan was offered to patient via MyChart   Nurse Notes: Patient declines all due vaccines.  Patient is up to date on all other health maintenance with no concerns to address today.

## 2024-10-27 ENCOUNTER — Ambulatory Visit

## 2024-11-04 ENCOUNTER — Other Ambulatory Visit: Payer: Self-pay | Admitting: Allergy & Immunology

## 2024-11-04 ENCOUNTER — Ambulatory Visit

## 2024-11-04 DIAGNOSIS — Z7901 Long term (current) use of anticoagulants: Secondary | ICD-10-CM

## 2024-11-04 LAB — POCT INR: INR: 3.3 — AB (ref 2.0–3.0)

## 2024-11-04 MED ORDER — WARFARIN SODIUM 2.5 MG PO TABS: ORAL_TABLET | ORAL | 1 refills | Status: AC

## 2024-11-04 NOTE — Progress Notes (Signed)
 Indication: embolic stroke. To perform more precise dosing a new script for 2.5 mg tablets has been sent to pharmacy. Pt educated on new dose and verbalized understanding.  Pick up new prescription of warfarin and start using tomorrow. Hold warfarin today and then change weekly dose to take 1 tablet daily except take 1/2 tablet on Monday.  Recheck in 2 week.

## 2024-11-04 NOTE — Patient Instructions (Addendum)
 Pre visit review using our clinic review tool, if applicable. No additional management support is needed unless otherwise documented below in the visit note.  Pick up new prescription of warfarin and start using tomorrow. Hold warfarin today and then change weekly dose to take 1 tablet daily except take 1/2 tablet on Monday.  Recheck in 2 week.

## 2024-11-06 ENCOUNTER — Other Ambulatory Visit: Payer: Self-pay

## 2024-11-06 MED ORDER — FAMOTIDINE 40 MG PO TABS: 40.0000 mg | ORAL_TABLET | Freq: Every day | ORAL | 3 refills | Status: AC

## 2024-11-07 ENCOUNTER — Ambulatory Visit

## 2024-11-11 ENCOUNTER — Ambulatory Visit

## 2024-11-11 DIAGNOSIS — J4550 Severe persistent asthma, uncomplicated: Secondary | ICD-10-CM | POA: Diagnosis not present

## 2024-11-12 ENCOUNTER — Ambulatory Visit
Admission: EM | Admit: 2024-11-12 | Discharge: 2024-11-12 | Disposition: A | Discharge status: Home / Self Care | Attending: Family Medicine | Admitting: Family Medicine

## 2024-11-12 ENCOUNTER — Ambulatory Visit (INDEPENDENT_AMBULATORY_CARE_PROVIDER_SITE_OTHER)

## 2024-11-12 ENCOUNTER — Ambulatory Visit: Payer: Self-pay

## 2024-11-12 DIAGNOSIS — R0602 Shortness of breath: Secondary | ICD-10-CM | POA: Diagnosis not present

## 2024-11-12 DIAGNOSIS — B370 Candidal stomatitis: Secondary | ICD-10-CM | POA: Diagnosis not present

## 2024-11-12 MED ORDER — CLOTRIMAZOLE 10 MG MT TROC: 10.0000 mg | Freq: Every day | OROMUCOSAL | 0 refills | Status: AC

## 2024-11-12 NOTE — ED Triage Notes (Signed)
 Patient presents to Urgent Care after contacting her PCP/specialist to request oral treatment for suspected thrush. She reports a recent choking episode involving cheese crackers; although she was able to clear the obstruction, she has since felt as though her throat is closing up. She has had a cough for some time, which had been improving. A few weeks ago, she started a new oral medication (possibly an antibiotic), which she believes may have contributed to the suspected thrush. At times--particularly today following the choking episode--she feels short of breath. She has a rescue inhaler for prn use, just used in room/intake (per Triage Nurse at PCP/Speciality office).

## 2024-11-12 NOTE — Telephone Encounter (Signed)
 FYI Only or Action Required?: FYI only for provider: UC/ED.  Patient was last seen in primary care on 10/15/2024 by Norleen Lynwood ORN, MD.  Called Nurse Triage reporting Choking.  Symptoms began today.  Interventions attempted: Nothing.  Symptoms are: unchanged.  Triage Disposition: See Physician Within 24 Hours  Patient/caregiver understands and will follow disposition?: Yes   Copied from CRM #[id number]. Topic: Clinical - Red Word Triage >> Nov 12, 2024  3:28 PM Alfonso ORN wrote: Red Word that prompted transfer to Nurse Triage: mouth is white, white ring around mouth, choking , thrush  Drinking, eating Last few weeks Reason for Disposition  [1] Symptoms of pill stuck in throat or esophagus (e.g., pain in throat or chest, FB sensation) AND [2] no relief after using Care Advice  Answer Assessment - Initial Assessment Questions Advised UC/ED now and 911 if symptoms worsen: diff breathing, chest pain, coughing spells, faint/dizziness.  Patient reports will go to [facility] now.   1. DESCRIPTION: Tell me more about this problem. Are you  having trouble swallowing liquids, solids, or both? Any trouble with swallowing saliva (spit)?     Coming and going choking when eating drinking brushing teeth, not currently, denies difficultly swallowing saliva or fluids. 2. SEVERITY: How bad is the swallowing difficulty?  (Scale 1-10; or mild, moderate, severe)     moderate 3. ONSET: When did the swallowing problems begin?      Few days ago 4. CAUSE: What do you think is causing the problem?  (e.g., dry mouth, food or pill stuck in throat, mouth pain, sore throat, progression of disease process such as dementia or Parkinson's disease).      Eating and drinking comes and goes, brushing teeth 5. CHRONIC or RECURRENT: Is this a new problem for you?  If No, ask: How long have you had this problem? (e.g., days, weeks, months)      new 6. OTHER SYMPTOMS: Do you have any  other symptoms? (e.g., chest pain, difficulty breathing, mouth sores, sore throat, swollen tongue, chest pain)   Denies severe chest pain, diff breathing, sore throat, swollen tongue Whiteness around mouth and inside mouth; thrush pt reports  Answer Assessment - Initial Assessment Questions 1. SUBSTANCE: What did you (the patient) choke on?  (e.g., food)     Thrush, few days ago Last episode 2. SIZE: If the object was solid, ask: How big was it?      thrush 3. ONSET: When did it begin? (e.g., minutes ago)      Few days ago 4. RESPIRATORY DISTRESS: Describe the patient's breathing., Are they able to talk?     Denies diff breathing  Protocols used: Choking - Inhaled Foreign Body-A-AH, Swallowing Difficulty-A-AH

## 2024-11-13 ENCOUNTER — Ambulatory Visit: Admitting: Family Medicine

## 2024-11-13 ENCOUNTER — Ambulatory Visit: Admitting: Vascular Surgery

## 2024-11-14 ENCOUNTER — Ambulatory Visit: Attending: Vascular Surgery | Admitting: Vascular Surgery

## 2024-11-14 ENCOUNTER — Encounter: Payer: Self-pay | Admitting: Vascular Surgery

## 2024-11-14 ENCOUNTER — Telehealth: Payer: Self-pay

## 2024-11-14 VITALS — BP 131/81 | HR 75 | Temp 98.1°F | Resp 20 | Ht 60.6 in | Wt 189.2 lb

## 2024-11-14 DIAGNOSIS — R519 Headache, unspecified: Secondary | ICD-10-CM | POA: Diagnosis not present

## 2024-11-14 NOTE — Telephone Encounter (Signed)
 Copied from CRM [id number]. Topic: Clinical - Medication Question >> Nov 12, 2024  2:30 PM Devaughn RAMAN wrote: Reason for CRM: Pt stated she has thrush and she needs a medication called in.   Pt was in UC 12/10 for coughing and medications were called in. Pt is feeling better and nothing further needed.

## 2024-11-14 NOTE — Progress Notes (Signed)
 Patient ID: Andrea Mason, female   DOB: [birth year], 73 y.o.   MRN: [id number]  Reason for Consult: New Patient (Initial Visit)   Referred by Norleen Lynwood ORN, MD  Subjective:     HPI [name] is a 73 y.o. female presenting for evaluation of left-sided headaches.  She reports that this started a little over a month ago.  She saw her primary care doctor and was given a prednisone  taper given that she had temporal pain and a moderately elevated ESR and there was concern for temporal arteritis.  At that time she was also given antibiotics for a pulmonary infection.  She reports over the last few weeks she has not had any pain.  There is no tenderness to either temporal region.  She denies any jaw pain or claudication.  She denies any new visual deficits although she does have chronic vision issues, wears glasses and still has some episodes of blurry vision.  Past Medical History:  Diagnosis Date   Allergic rhinitis    Allergy     Asthma    DJD (degenerative joint disease), cervical    DVT (deep venous thrombosis) (HCC)    GERD (gastroesophageal reflux disease)    History of colonic polyps    Hypertension    Obesity    Primary hyperaldosteronism 11/06/2013   Stroke [facility])    Syncope    2014   Family History  Problem Relation Age of Onset   Asthma Mother    Allergic rhinitis Mother    Cancer Mother    Dementia Mother    Allergic rhinitis Father    Asthma Sister    Allergic rhinitis Sister    Diabetes Sister    Allergic rhinitis Brother    Colon cancer Brother    Stroke Brother    Arrhythmia Brother    Allergic rhinitis Brother    Congenital heart disease Brother    Heart attack Brother    Lung disease Son        Sarcoid vs ARDS related fibrosis, required 2 double lung transplants   Esophageal cancer Neg Hx    Rectal cancer Neg Hx    Stomach cancer Neg Hx    Past Surgical History:  Procedure Laterality Date   COLONOSCOPY     FOOT SURGERY  1998   Implantable loop  recorder placement  09/16/14   MDT LINQ implanted by Dr Kelsie for cryptogenic stroke, RIO II protocol   LOOP RECORDER REMOVAL N/A 10/02/2017   Procedure: LOOP RECORDER REMOVAL;  Surgeon: Kelsie Lynwood, MD;  Location: [facility];  Service: Cardiovascular;  Laterality: N/A;   NASAL POLYP SURGERY  07/2006   x 2 with Dr. Mable   NASAL SINUS SURGERY  07/2006   Dr. Mable   TUBAL LIGATION      Short Social History:  Social History   Tobacco Use   Smoking status: Never    Passive exposure: Current (on sister's clothing bc sister is a smoker)   Smokeless tobacco: Never  Substance Use Topics   Alcohol use: Yes    Comment: socially wine    Allergies[1]  Current Outpatient Medications  Medication Sig Dispense Refill   albuterol  (VENTOLIN  HFA) 108 (90 Base) MCG/ACT inhaler Inhale 1-2 puffs into the lungs every 6 (six) hours as needed for wheezing or shortness of breath. 1 each 3   alendronate  (FOSAMAX ) 70 MG tablet TAKE 1 TABLET(70 MG) BY MOUTH EVERY 7 DAYS WITH A FULL GLASS OF WATER AND ON AN  EMPTY STOMACH 12 tablet 3   aspirin  EC 81 MG tablet Take 1 tablet (81 mg total) by mouth daily. Swallow whole. 30 tablet 12   Biotin 89999 MCG TABS Take 10,000 mcg by mouth daily.     budesonide -glycopyrrolate-formoterol  (BREZTRI  AEROSPHERE) 160-9-4.8 MCG/ACT AERO inhaler Inhale 2 puffs into the lungs 2 (two) times daily. 3 each 4   cetirizine  (ZYRTEC ) 10 MG tablet Take 10 mg by mouth at bedtime.      Cholecalciferol (VITAMIN D3) 2000 units TABS Take 2,000 Units by mouth daily.     clotrimazole  (MYCELEX ) 10 MG troche Take 1 tablet (10 mg total) by mouth 5 (five) times daily. 70 tablet 0   Cyanocobalamin  (VITAMIN B-12 PO) Take 1,000 mcg by mouth daily.     famotidine  (PEPCID ) 40 MG tablet Take 1 tablet (40 mg total) by mouth at bedtime. 30 tablet 3   ipratropium (ATROVENT ) 0.06 % nasal spray USE 2 SPRAYS IN EACH NOSTRIL THREE TIMES DAILY 15 mL 5   Mepolizumab  (NUCALA ) 100 MG/ML SOAJ  Inject into the skin every 28 (twenty-eight) days.     montelukast  (SINGULAIR ) 10 MG tablet Take 1 tablet (10 mg total) by mouth at bedtime. 90 tablet 3   OVER THE COUNTER MEDICATION GO-LI     pantoprazole  (PROTONIX ) 40 MG tablet Take 1 tablet (40 mg total) by mouth 2 (two) times daily. 60 tablet 5   predniSONE  (DELTASONE ) 10 MG tablet 3 tabs by mouth per day for 3 days,2tabs per day for 3 days,1tab per day for 3 days 18 tablet 0   Respiratory Therapy Supplies (FLUTTER) DEVI Use as directed     Safety Seal Miscellaneous MISC 1 Application by Does not apply route in the morning. 30 mL 3   sodium chloride  (OCEAN) 0.65 % SOLN nasal spray Place 2 sprays into both nostrils as needed (dry nose).      Spacer/Aero-Holding Raguel FRENCH by Does not apply route.     spironolactone  (ALDACTONE ) 25 MG tablet TAKE 1 TABLET(25 MG) BY MOUTH DAILY 90 tablet 1   trospium  (SANCTURA ) 20 MG tablet Take 1 tablet (20 mg total) by mouth 2 (two) times daily. 60 tablet 6   warfarin (COUMADIN ) 2.5 MG tablet TAKE 1 TABLET BY MOUTH DAILY EXCEPT TAKE 1/2 TABLET ON MONDAY OR AS DIRECTED BY ANTICOAGULATION CLINIC 100 tablet 1   Current Facility-Administered Medications  Medication Dose Route Frequency Provider Last Rate Last Admin   mepolizumab  (NUCALA ) injection 100 mg  100 mg Subcutaneous Q28 days Iva Marty Saltness, MD   100 mg at 11/11/24 1340    REVIEW OF SYSTEMS  All other systems were reviewed and are negative     Objective:  Objective   Vitals:   11/14/24 1511  BP: 131/81  Pulse: 75  Resp: 20  Temp: 98.1 F (36.7 C)  TempSrc: Temporal  SpO2: 98%  Weight: 189 lb 3.2 oz (85.8 kg)  Height: 5' 0.6 (1.539 m)   Body mass index is 36.22 kg/m.  Physical Exam General: no acute distress Cardiac: hemodynamically stable Extremities: no edema, cyanosis or wounds Vascular: No pain or tenderness in the temporal region bilaterally.  Palpable radial pulses bilaterally.      Assessment/Plan:   Andrea Mason is a 73 y.o. female who presented for evaluation of left-sided head pain.  This is resolved and she is no longer having any pain or headaches.  Her ESR was moderately elevated in the 60s about a month ago but at that time she  was also being treated for a pulmonary infection with COPD. I explained giant cell arteritis and discussed a biopsy.  I reviewed the risks and benefits of the surgical procedure and I also explained that the test does have a higher level of false positives and false negatives then we would typically like to see.  We discussed that she is no longer having any symptoms and she feels that a surgical biopsy is not absolutely necessary at this time. I encouraged her to call the office should she start having any recurrence of temporal pain or worsening vision and we will get her scheduled for temporal artery biopsy.    Norman GORMAN Serve MD [facility]     [1]  Allergies Allergen Reactions   Iodine Nausea And Vomiting and Rash    Reaction: hot flashes   Ivp Dye [Iodinated Contrast Media] Nausea And Vomiting and Other (See Comments)    hot flashes   Promethazine -Codeine  Nausea And Vomiting   Tramadol  Other (See Comments)    Almost passed out

## 2024-11-15 NOTE — ED Provider Notes (Signed)
 [facility]   [id number] 11/12/24 Arrival Time: 1617  ASSESSMENT & PLAN:  1. SOB (shortness of breath)   2. Oral thrush    I have personally viewed and independently interpreted the imaging studies ordered this visit. CXR: no acute changes.  May be developing oral thrush. Begin: Meds ordered this encounter  Medications   clotrimazole  (MYCELEX ) 10 MG troche    Sig: Take 1 tablet (10 mg total) by mouth 5 (five) times daily.    Dispense:  70 tablet    Refill:  0    Follow-up Information     Norleen Lynwood ORN, MD.   Specialties: Internal Medicine, Radiology Why: If worsening or failing to improve as anticipated. Contact information: [address] [place] [address] [phone]                 Reviewed expectations re: course of current medical issues. Questions answered. Outlined signs and symptoms indicating need for more acute intervention. Understanding verbalized. After Visit Summary given.   SUBJECTIVE: History from: Patient. Andrea Mason is a 73 y.o. female. Patient presents to Urgent Care after contacting her PCP/specialist to request oral treatment for suspected thrush. She reports a recent choking episode involving cheese crackers; although she was able to clear the obstruction, she has since felt as though her throat is closing up. She has had a cough for some time, which had been improving. A few weeks ago, she started a new oral medication (possibly an antibiotic), which she believes may have contributed to the suspected thrush. At times--particularly today following the choking episode--she feels short of breath. She has a rescue inhaler for prn use, just used in room/intake (per Triage Nurse at PCP/Speciality office).  Denies: fever. Normal PO intake without n/v/d.  OBJECTIVE:  Vitals:   11/12/24 1625 11/12/24 1626 11/12/24 1630  BP: (!) 142/84    Pulse: 92    Resp: (!) 22  20  Temp: 97.9 F (36.6 C)    TempSrc: Oral    SpO2:  96%    Weight:  84.4 kg   Height:  5' 0.63 (1.54 m)     General appearance: alert; no distress Eyes: PERRLA; EOMI; conjunctiva normal HENT: [place]; AT; with nasal congestion; does have whitish material on tongue and cheeks; some on inner cheek that I can scrape off; throat looks ok Neck: supple  Lungs: speaks full sentences without difficulty; unlabored (recheck RR 18) Extremities: no edema Skin: warm and dry Neurologic: normal gait Psychological: alert and cooperative; normal mood and affect  Labs:  Labs Reviewed - No data to display  Imaging: DG Chest 2 View Result Date: 11/12/2024 EXAM: 2 VIEW(S) XRAY OF THE CHEST 11/12/2024 04:49:56 PM COMPARISON: 10/05/2024 CLINICAL HISTORY: SOB FINDINGS: LUNGS AND PLEURA: No focal pulmonary opacity. No pleural effusion. No pneumothorax. HEART AND MEDIASTINUM: Calcified and tortuous aorta. No acute abnormality of the cardiac and mediastinal silhouettes. BONES AND SOFT TISSUES: Thoracic degenerative changes. No acute osseous abnormality. IMPRESSION: 1. No acute cardiopulmonary abnormality. Electronically signed by: Pinkie Pebbles MD 11/12/2024 05:27 PM EST RP Workstation: [id number]    Allergies[1]  Past Medical History:  Diagnosis Date   Allergic rhinitis    Allergy     Asthma    DJD (degenerative joint disease), cervical    DVT (deep venous thrombosis) (HCC)    GERD (gastroesophageal reflux disease)    History of colonic polyps    Hypertension    Obesity    Primary hyperaldosteronism 11/06/2013   Stroke (HCC)  Syncope    2014   Social History   Socioeconomic History   Marital status: Widowed    Spouse name: Not on file   Number of children: 3   Years of education: Not on file   Highest education level: Some college, no degree  Occupational History   Occupation: museum/gallery curator    Comment: [employer]   Occupation: advertising account executive  Tobacco Use   Smoking status: Never    Passive exposure: Current (on sister's clothing  bc sister is a smoker)   Smokeless tobacco: Never  Vaping Use   Vaping status: Never Used  Substance and Sexual Activity   Alcohol use: Yes    Comment: socially wine   Drug use: No   Sexual activity: Not Currently    Birth control/protection: Post-menopausal  Other Topics Concern   Not on file  Social History Narrative   Widowed, husband died in 12/21/2006   Works at [employer] as a museum/gallery curator   1 children, 1 died from homicide and 1 child is deceased.   Patient lives alone.   Social Drivers of Health   Tobacco Use: Medium Risk (11/14/2024)   Patient History    Smoking Tobacco Use: Never    Smokeless Tobacco Use: Never    Passive Exposure: Current  Financial Resource Strain: Medium Risk (10/23/2024)   Overall Financial Resource Strain (CARDIA)    Difficulty of Paying Living Expenses: Somewhat hard  Food Insecurity: Food Insecurity Present (10/23/2024)   Epic    Worried About Programme Researcher, Broadcasting/film/video in the Last Year: Sometimes true    Ran Out of Food in the Last Year: Sometimes true  Transportation Needs: No Transportation Needs (10/23/2024)   Epic    Lack of Transportation (Medical): No    Lack of Transportation (Non-Medical): No  Physical Activity: Insufficiently Active (10/23/2024)   Exercise Vital Sign    Days of Exercise per Week: 4 days    Minutes of Exercise per Session: 20 min  Stress: No Stress Concern Present (10/23/2024)   [employer] of Occupational Health - Occupational Stress Questionnaire    Feeling of Stress: Only a little  Social Connections: Moderately Integrated (10/23/2024)   Social Connection and Isolation Panel    Frequency of Communication with Friends and Family: More than three times a week    Frequency of Social Gatherings with Friends and Family: Three times a week    Attends Religious Services: More than 4 times per year    Active Member of Clubs or Organizations: Yes    Attends Banker Meetings: More than 4 times per year     Marital Status: Widowed  Intimate Partner Violence: Patient Unable To Answer (10/24/2024)   Epic    Fear of Current or Ex-Partner: Patient unable to answer    Emotionally Abused: Patient unable to answer    Physically Abused: Patient unable to answer    Sexually Abused: Patient unable to answer  Depression (PHQ2-9): Low Risk (10/24/2024)   Depression (PHQ2-9)    PHQ-2 Score: 0  Alcohol Screen: Low Risk (10/19/2023)   Alcohol Screen    Last Alcohol Screening Score (AUDIT): 2  Housing: Unknown (10/23/2024)   Epic    Unable to Pay for Housing in the Last Year: No    Number of Times Moved in the Last Year: Not on file    Homeless in the Last Year: No  Utilities: Not At Risk (10/24/2024)   Epic    Threatened with loss of  utilities: No  Health Literacy: Adequate Health Literacy (10/19/2023)   B1300 Health Literacy    Frequency of need for help with medical instructions: Never   Family History  Problem Relation Age of Onset   Asthma Mother    Allergic rhinitis Mother    Cancer Mother    Dementia Mother    Allergic rhinitis Father    Asthma Sister    Allergic rhinitis Sister    Diabetes Sister    Allergic rhinitis Brother    Colon cancer Brother    Stroke Brother    Arrhythmia Brother    Allergic rhinitis Brother    Congenital heart disease Brother    Heart attack Brother    Lung disease Son        Sarcoid vs ARDS related fibrosis, required 2 double lung transplants   Esophageal cancer Neg Hx    Rectal cancer Neg Hx    Stomach cancer Neg Hx    Past Surgical History:  Procedure Laterality Date   COLONOSCOPY     FOOT SURGERY  1998   Implantable loop recorder placement  09/16/14   MDT LINQ implanted by Dr Kelsie for cryptogenic stroke, RIO II protocol   LOOP RECORDER REMOVAL N/A 10/02/2017   Procedure: LOOP RECORDER REMOVAL;  Surgeon: Kelsie Agent, MD;  Location: [facility];  Service: Cardiovascular;  Laterality: N/A;   NASAL POLYP SURGERY  07/2006   x 2 with Dr.  Mable   NASAL SINUS SURGERY  07/2006   Dr. Mable   TUBAL LIGATION        [1]  Allergies Allergen Reactions   Iodine Nausea And Vomiting and Rash    Reaction: hot flashes   Ivp Dye [Iodinated Contrast Media] Nausea And Vomiting and Other (See Comments)    hot flashes   Promethazine -Codeine  Nausea And Vomiting   Tramadol  Other (See Comments)    Almost passed out     Rolinda Rogue, MD 11/15/24 [phone]

## 2024-11-18 ENCOUNTER — Ambulatory Visit

## 2024-11-18 DIAGNOSIS — Z7901 Long term (current) use of anticoagulants: Secondary | ICD-10-CM

## 2024-11-18 LAB — POCT INR: INR: 2.9 (ref 2.0–3.0)

## 2024-11-18 NOTE — Patient Instructions (Addendum)
 Pre visit review using our clinic review tool, if applicable. No additional management support is needed unless otherwise documented below in the visit note.  Continue 1 tablet daily except take 1/2 tablet on Monday.  Recheck in 4 week.

## 2024-11-18 NOTE — Progress Notes (Signed)
 Indication: embolic stroke. Continue 1 tablet daily except take 1/2 tablet on Monday.  Recheck in 4 week.

## 2024-12-09 ENCOUNTER — Ambulatory Visit (INDEPENDENT_AMBULATORY_CARE_PROVIDER_SITE_OTHER)

## 2024-12-09 DIAGNOSIS — J4550 Severe persistent asthma, uncomplicated: Secondary | ICD-10-CM

## 2024-12-15 ENCOUNTER — Encounter: Payer: Self-pay | Admitting: *Deleted

## 2024-12-16 ENCOUNTER — Ambulatory Visit

## 2024-12-16 DIAGNOSIS — Z7901 Long term (current) use of anticoagulants: Secondary | ICD-10-CM | POA: Diagnosis not present

## 2024-12-16 LAB — POCT INR: INR: 2.8 (ref 2.0–3.0)

## 2024-12-16 NOTE — Progress Notes (Signed)
 Indication: embolic stroke. Continue 1 tablet daily except take 1/2 tablet on Monday.  Recheck in 4 week.

## 2024-12-16 NOTE — Patient Instructions (Addendum)
 Pre visit review using our clinic review tool, if applicable. No additional management support is needed unless otherwise documented below in the visit note.  Continue 1 tablet daily except take 1/2 tablet on Monday.  Recheck in 4 week.

## 2024-12-26 ENCOUNTER — Other Ambulatory Visit: Payer: Self-pay | Admitting: Internal Medicine

## 2024-12-26 MED ORDER — ALENDRONATE SODIUM 70 MG PO TABS: 70.0000 mg | ORAL_TABLET | ORAL | 3 refills | Status: AC

## 2024-12-26 NOTE — Telephone Encounter (Signed)
 Copied from CRM #[id number]. Topic: Clinical - Medication Refill >> Dec 26, 2024 11:08 AM Tonda B wrote: Medication: alendronate  (FOSAMAX ) 70 MG tablet   Has the patient contacted their pharmacy? Yes (Agent: If no, request that the patient contact the pharmacy for the refill. If patient does not wish to contact the pharmacy document the reason why and proceed with request.) (Agent: If yes, when and what did the pharmacy advise?)  This is the patient's preferred pharmacy:  [id number] - [place], [place] - [address] AT [facility] OF [address] [place] [address] Phone: [phone] Fax: [phone]  [id number] [place], [place] - [address] AT [address] [place] [address] Phone: [phone] Fax: [phone]  [facility] - [place], [place] - [address] [place] [address] Phone: [phone] Fax: [phone]  [facility] - [place], [place] - [address] [place] [address] Phone: [phone] Fax: [phone]  Is this the correct pharmacy for this prescription? Yes If no, delete pharmacy and type the correct one.   Has the prescription been filled recently? Yes  Is the patient out of the medication? Yes  Has the patient been seen for an appointment in the last year OR does the patient have an upcoming appointment? Yes  Can we respond through MyChart? No  Agent: Please be advised that Rx refills may take up to 3 business days. We ask that you follow-up with your pharmacy.

## 2025-01-06 ENCOUNTER — Ambulatory Visit

## 2025-01-13 ENCOUNTER — Ambulatory Visit

## 2025-01-14 ENCOUNTER — Inpatient Hospital Stay: Admitting: Hematology and Oncology

## 2025-01-14 ENCOUNTER — Inpatient Hospital Stay

## 2025-02-10 ENCOUNTER — Ambulatory Visit: Admitting: Allergy & Immunology

## 2025-04-14 ENCOUNTER — Ambulatory Visit: Admitting: Dermatology

## 2025-06-11 ENCOUNTER — Encounter: Admitting: Family Medicine
